# Patient Record
Sex: Female | Born: 1970 | Race: White | Hispanic: No | Smoking: Never smoker
Health system: Southern US, Community
[De-identification: ages and names within clinical notes are randomized; demographics above are authoritative.]

## PROBLEM LIST (undated history)

## (undated) DIAGNOSIS — M1990 Unspecified osteoarthritis, unspecified site: Secondary | ICD-10-CM

## (undated) DIAGNOSIS — C801 Malignant (primary) neoplasm, unspecified: Secondary | ICD-10-CM

## (undated) DIAGNOSIS — Z803 Family history of malignant neoplasm of breast: Secondary | ICD-10-CM

## (undated) DIAGNOSIS — Z923 Personal history of irradiation: Secondary | ICD-10-CM

## (undated) DIAGNOSIS — Z9221 Personal history of antineoplastic chemotherapy: Secondary | ICD-10-CM

## (undated) HISTORY — PX: ABDOMINAL HYSTERECTOMY: SHX81

## (undated) HISTORY — PX: BREAST LUMPECTOMY: SHX2

## (undated) HISTORY — PX: HYMENECTOMY: SHX987

## (undated) HISTORY — DX: Family history of malignant neoplasm of breast: Z80.3

---

## 2016-03-26 ENCOUNTER — Other Ambulatory Visit (HOSPITAL_COMMUNITY)
Admission: RE | Admit: 2016-03-26 | Discharge: 2016-03-26 | Disposition: A | Discharge status: Home / Self Care | Payer: Managed Care, Other (non HMO) | Source: Ambulatory Visit | Attending: Family Medicine | Admitting: Family Medicine

## 2016-03-26 ENCOUNTER — Other Ambulatory Visit: Payer: Self-pay | Admitting: Family Medicine

## 2016-03-26 DIAGNOSIS — Z124 Encounter for screening for malignant neoplasm of cervix: Secondary | ICD-10-CM | POA: Diagnosis present

## 2016-03-26 DIAGNOSIS — Z1151 Encounter for screening for human papillomavirus (HPV): Secondary | ICD-10-CM | POA: Diagnosis not present

## 2016-03-27 LAB — CYTOLOGY - PAP

## 2016-03-31 ENCOUNTER — Other Ambulatory Visit: Payer: Self-pay | Admitting: Family Medicine

## 2016-04-01 ENCOUNTER — Other Ambulatory Visit: Payer: Self-pay | Admitting: Family Medicine

## 2016-04-01 DIAGNOSIS — R1030 Lower abdominal pain, unspecified: Secondary | ICD-10-CM

## 2016-04-01 DIAGNOSIS — N946 Dysmenorrhea, unspecified: Secondary | ICD-10-CM

## 2016-04-06 ENCOUNTER — Ambulatory Visit
Admission: RE | Admit: 2016-04-06 | Discharge: 2016-04-06 | Disposition: A | Discharge status: Home / Self Care | Payer: Managed Care, Other (non HMO) | Source: Ambulatory Visit | Attending: Family Medicine | Admitting: Family Medicine

## 2016-04-06 DIAGNOSIS — N946 Dysmenorrhea, unspecified: Secondary | ICD-10-CM

## 2016-04-06 DIAGNOSIS — R1030 Lower abdominal pain, unspecified: Secondary | ICD-10-CM

## 2016-04-06 IMAGING — US US PELVIS COMPLETE
1 series · 13 of 25 positions shown · non-contrast
Comparison: None

CLINICAL DATA: Left lower quadrant pain for 1 month. Dysmenorrhea.
Unknown LMP.

EXAM:
TRANSABDOMINAL AND TRANSVAGINAL ULTRASOUND OF PELVIS
TECHNIQUE: Both transabdominal and transvaginal ultrasound examinations of the
pelvis were performed. Transabdominal technique was performed for
global imaging of the pelvis including uterus, ovaries, adnexal
regions, and pelvic cul-de-sac. It was necessary to proceed with
endovaginal exam following the transabdominal exam to visualize the
endometrium and ovaries.

[Series 1: us pelvis complete · 0.30mm/px · 13 of 81 slices shown]
[im 1/81]
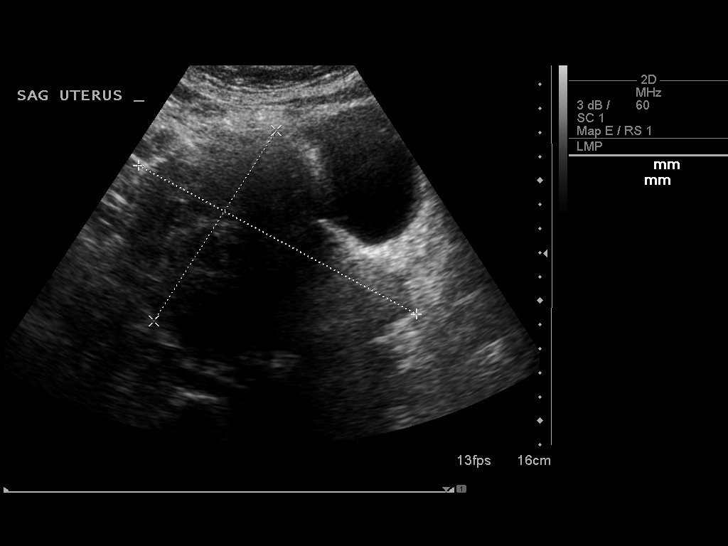
[im 7/81]
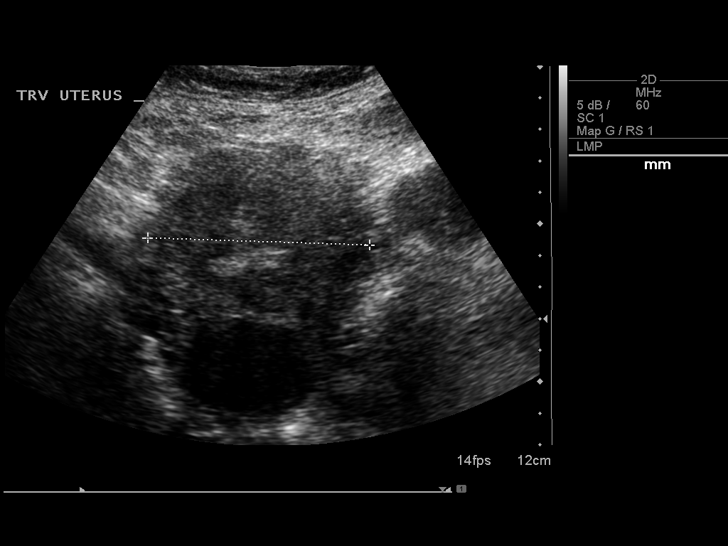
[im 14/81]
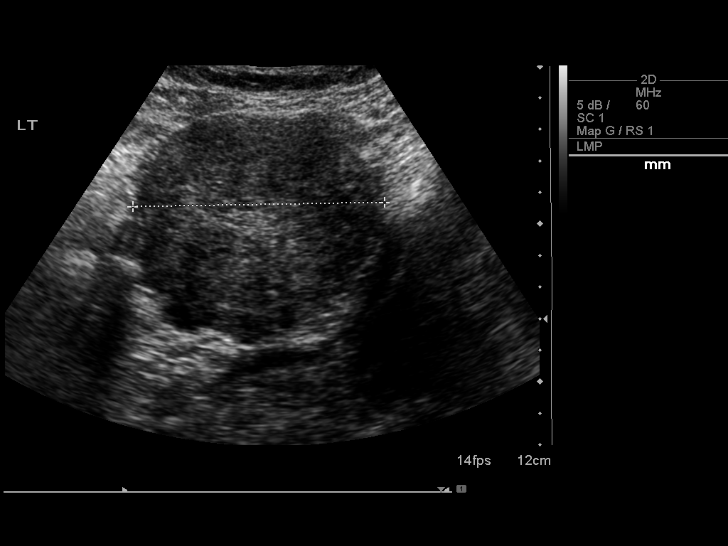
[im 21/81]
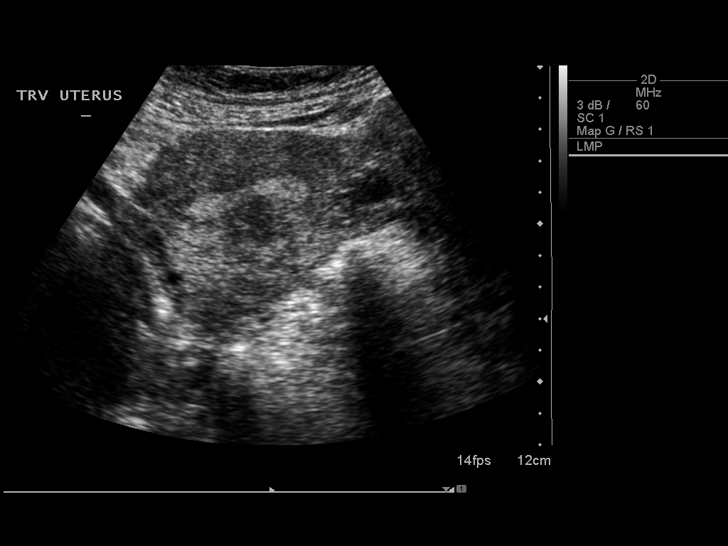
[im 27/81]
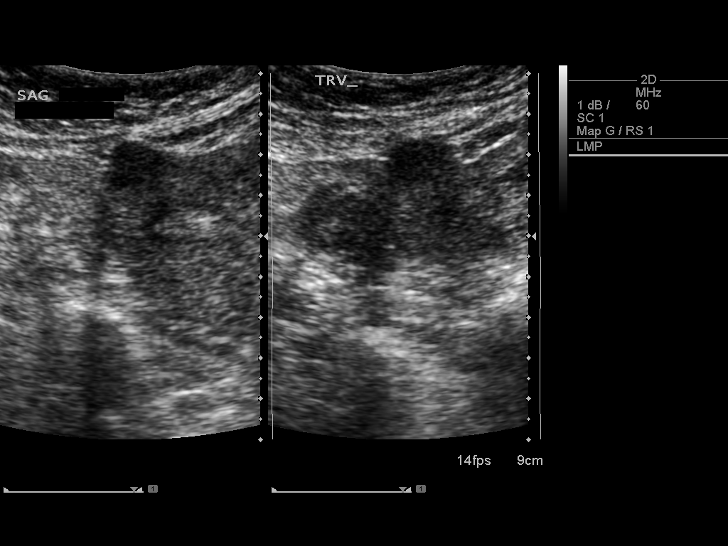
[im 34/81]
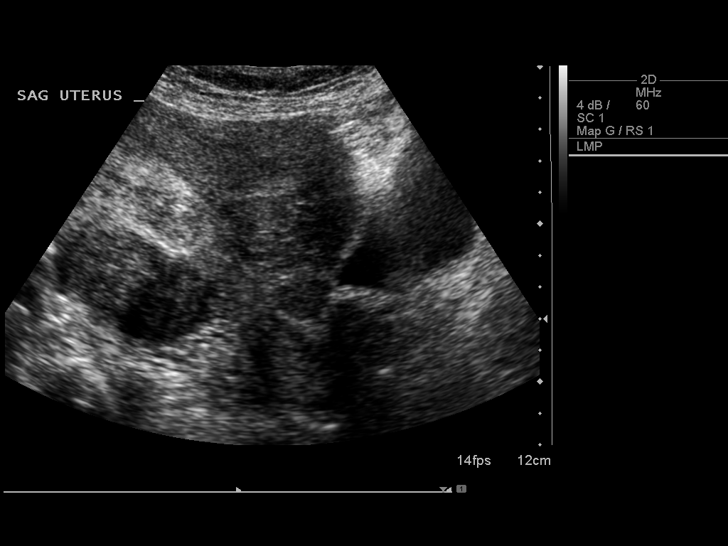
[im 41/81]
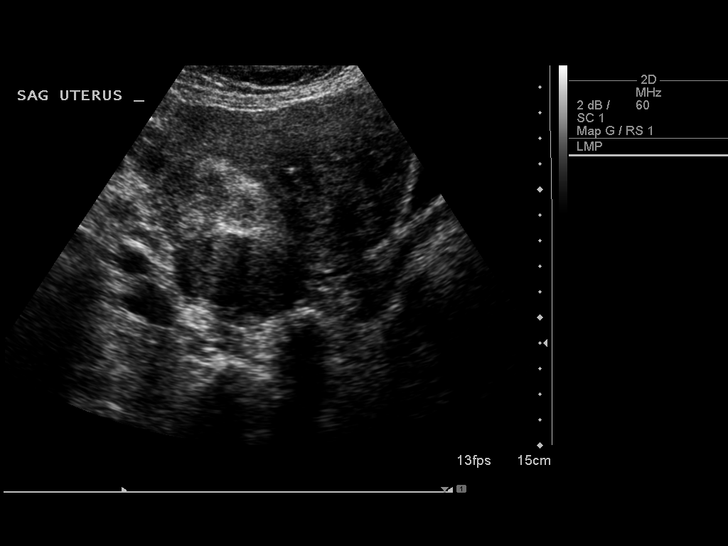
[im 47/81]
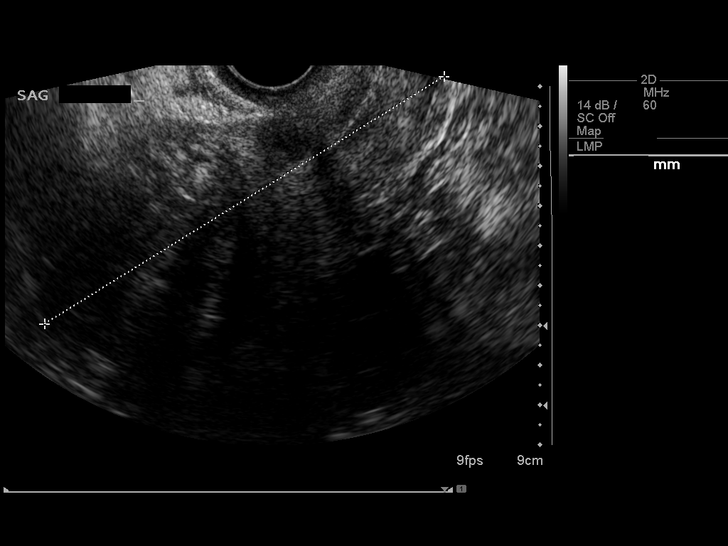
[im 54/81]
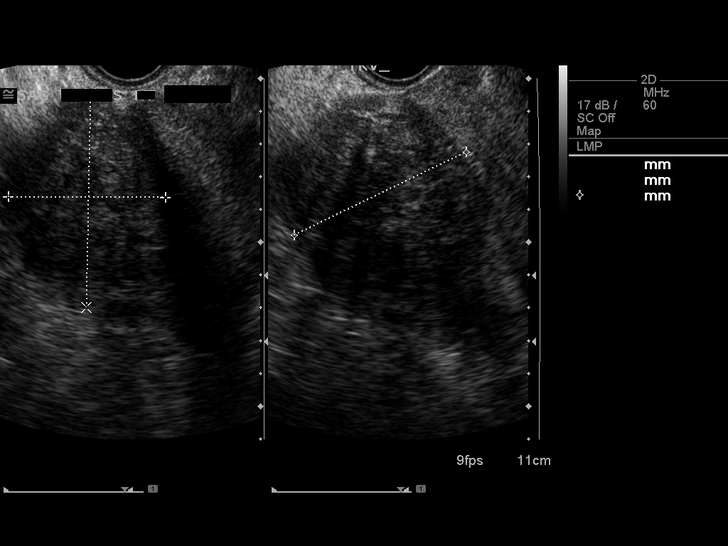
[im 61/81]
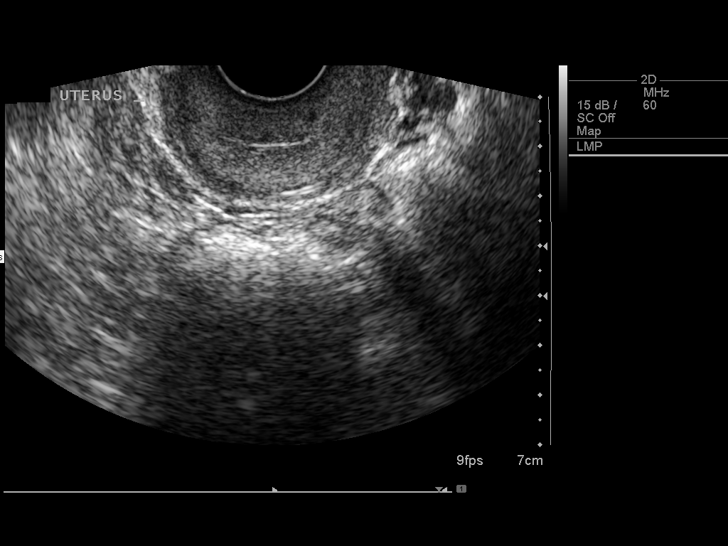
[im 67/81]
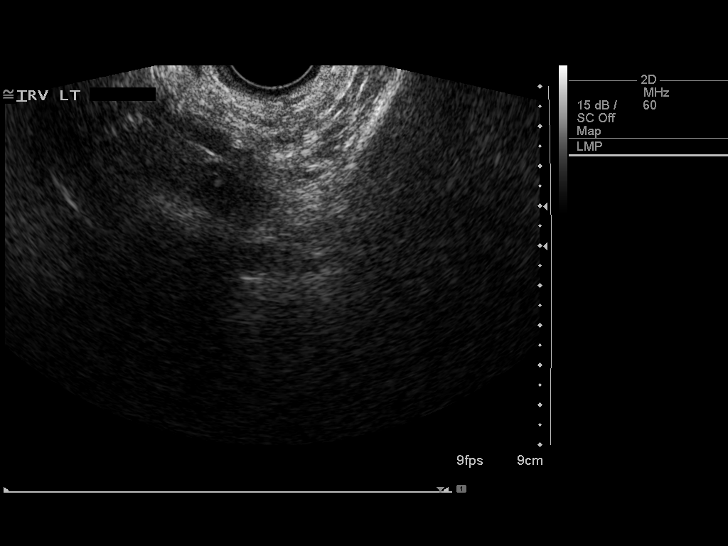
[im 74/81]
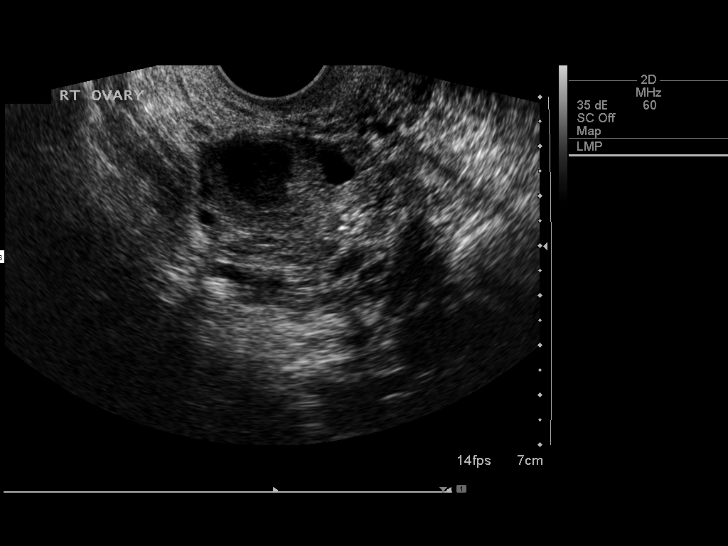
[im 81/81]
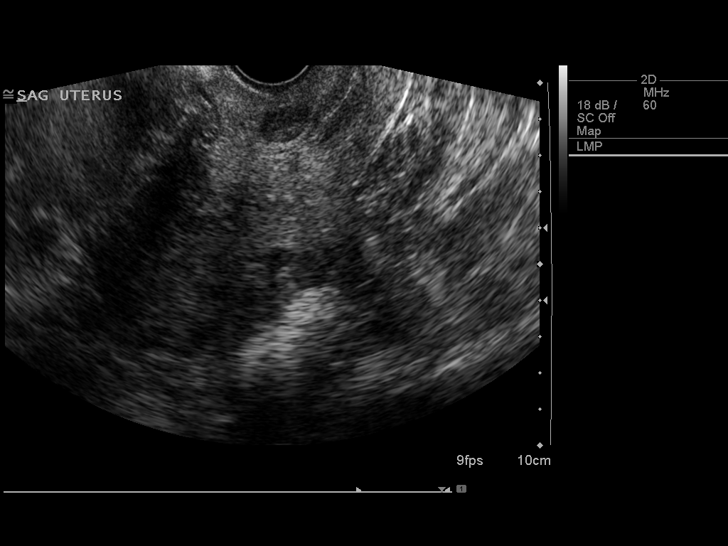

[13 of 25 positions shown; findings below may reference images not displayed]

FINDINGS: Uterus

Measurements: 13.1 x 9.5 x 13.6 cm. Multiple fibroids are seen which
involve the uterus diffusely, approximately 7 in number. Largest
fibroid is subserosal in location and arising from the left fundus
measuring 8.1 cm. Second largest fibroid is also subserosal location
in the anterior uterine body measuring 6.7 cm. At least 1 submucosal
fibroid is seen in the posterior corpus measuring 2.1 cm.

Endometrium

Thickness: 13 mm. Extrinsic compression from small submucosal
fibroid described above.

Right ovary

Measurements: 3.5 x 3.6 x 2.8 cm. 2 cm corpus luteum noted.
Otherwise normal appearance/no adnexal mass.

Left ovary

Measurements: 2.2 x 2.9 x 1.7 cm. Normal appearance/no adnexal mass.

Other findings

No abnormal free fluid.
IMPRESSION: Diffuse uterine involvement by fibroids, largest measuring 8.1 cm.
At least 1 small submucosal fibroid is seen measuring 2 cm.

2 cm right ovarian corpus luteum noted. Otherwise normal appearance
of ovaries.

## 2017-01-06 NOTE — Patient Instructions (Signed)
Your procedure is scheduled on:  Wednesday, January 20, 2017  Enter through the [employer] [facility] at:  7:00 AM  Pick up the phone at the desk and dial [phone].  Call this number if you have problems the morning of surgery: [phone].  Remember: Do NOT eat food or drink after:  Midnight Tuesday  Take these medicines the morning of surgery with a SIP OF WATER:  None  Stop ALL herbal medications at this time  Do NOT smoke the day of surgery.  Do NOT wear jewelry (body piercing), metal hair clips/bobby pins, make-up, artifical eyelashes or nail polish. Do NOT wear lotions, powders, or perfumes.  You may wear deodorant. Do NOT shave for 48 hours prior to surgery. Do NOT bring valuables to the hospital. Contacts, dentures, or bridgework may not be worn into surgery.  Leave suitcase in car.  After surgery it may be brought to your room.  For patients admitted to the hospital, checkout time is 11:00 AM the day of discharge.  Bring a copy of your healthcare power of attorney and living will documents.

## 2017-01-07 ENCOUNTER — Encounter (HOSPITAL_COMMUNITY): Payer: Self-pay

## 2017-01-07 ENCOUNTER — Other Ambulatory Visit: Payer: Self-pay | Admitting: Obstetrics and Gynecology

## 2017-01-07 ENCOUNTER — Encounter (HOSPITAL_COMMUNITY)
Admission: RE | Admit: 2017-01-07 | Discharge: 2017-01-07 | Disposition: A | Discharge status: Home / Self Care | Payer: Managed Care, Other (non HMO) | Source: Ambulatory Visit | Attending: Obstetrics and Gynecology | Admitting: Obstetrics and Gynecology

## 2017-01-07 DIAGNOSIS — Z01818 Encounter for other preprocedural examination: Secondary | ICD-10-CM | POA: Diagnosis not present

## 2017-01-07 HISTORY — DX: Unspecified osteoarthritis, unspecified site: M19.90

## 2017-01-07 LAB — CBC
HCT: 41.1 % (ref 36.0–46.0)
Hemoglobin: 13.5 g/dL (ref 12.0–15.0)
MCH: 29.2 pg (ref 26.0–34.0)
MCHC: 32.8 g/dL (ref 30.0–36.0)
MCV: 88.8 fL (ref 78.0–100.0)
Platelets: 297 10*3/uL (ref 150–400)
RBC: 4.63 MIL/uL (ref 3.87–5.11)
RDW: 12.8 % (ref 11.5–15.5)
WBC: 10.4 10*3/uL (ref 4.0–10.5)

## 2017-01-18 NOTE — Anesthesia Preprocedure Evaluation (Addendum)
Anesthesia Evaluation  Patient identified by MRN, date of birth, ID band Patient awake    Reviewed: Allergy & Precautions, H&P , Patient's Chart, lab work & pertinent test results, reviewed documented beta blocker date and time   Airway Mallampati: II  TM Distance: >3 FB Neck ROM: full    Dental no notable dental hx.    Pulmonary    Pulmonary exam normal breath sounds clear to auscultation       Cardiovascular  Rhythm:regular Rate:Normal     Neuro/Psych    GI/Hepatic   Endo/Other    Renal/GU      Musculoskeletal   Abdominal   Peds  Hematology   Anesthesia Other Findings   Reproductive/Obstetrics                             Anesthesia Physical Anesthesia Plan  ASA: II  Anesthesia Plan: General   Post-op Pain Management:    Induction: Intravenous  PONV Risk Score and Plan: 2 and Ondansetron, Dexamethasone, Propofol and Scopolamine patch - Pre-op  Airway Management Planned: Oral ETT  Additional Equipment:   Intra-op Plan:   Post-operative Plan: Extubation in OR  Informed Consent: I have reviewed the patients History and Physical, chart, labs and discussed the procedure including the risks, benefits and alternatives for the proposed anesthesia with the patient or authorized representative who has indicated his/her understanding and acceptance.   Dental Advisory Given  Plan Discussed with: CRNA and Surgeon  Anesthesia Plan Comments: (  )       Anesthesia Quick Evaluation

## 2017-01-19 ENCOUNTER — Other Ambulatory Visit: Payer: Self-pay | Admitting: Obstetrics and Gynecology

## 2017-01-19 NOTE — H&P (Deleted)
  The note originally documented on this encounter has been moved the the encounter in which it belongs.  

## 2017-01-19 NOTE — H&P (Signed)
Chief Complaint(s):   Heavy menses / PreOp history and physical for 01/20/17   HPI:  General 46 y/o Monica [name] who presents for preop history and physical exam in preparation for robotic assisted laparoscopic hysterectomy scheduled for 01/20/2017. she is a patient of Dr. Anderson Malta Ozan's. She has been onthe Lysteda for the past 6 mos and she has noted significant improvement; however, she ultimately desires surgical management. In review, she reported that menses last for a full 7-8 days with 3 very heavy days. On heavy days she has to change 2 ultra and 2 pads twice an hour then after those 3 days she can go every 2-3hrs. She also notes dysmenorrhea as well as feeling discomfort on her left side.  Since starting the Lysteda, she now only needs one tampon at a time and will last for several hours. She is no longer rushing to the bathroom or concerned about overflow. However, this medication is expensive and she desires definitive management via hystereocotmy.  US performed 9/25/Monica: Uterus measures 13x9.5x3.6cm, multiple fibroids (7)- largest subserosal 8.1cm then 6.7cm then ~ 2cm in size. Normal ovaries bilaterally.  Current Medication:  Taking  iron 1 tab Oral     Lysteda 650 MG Tablet 2 tablets Orally Three times daily during heavy menses only     Fexofenadine HCl 60 MG Tablet 1 tablet as needed Orally Twice a day     Vitamin B Complex - Tablet Orally     Medication List reviewed and reconciled with the patient   Medical History:   MRI 2013 - DDD, L3-5 disc bulges     left knee arthritis - has done PT in the past      Allergies/Intolerance:   N.K.D.A.   Gyn History:   Sexual activity not currently sexually active. Periods : every month. Denies Birth control. Last pap smear date 03/26/2016 Negative. Last mammogram date 09/21/12. Denies Abnormal pap smear. Denies STD. Menarche 58.   OB History:   Number of pregnancies 2. Pregnancy # 1 live birth, vaginal delivery. Pregnancy # 2 live birth,  vaginal delivery.   Surgical History:   hymenectomy as a teenager   Hospitalization:   Denies Past Hospitalization   Family History:   Father: alive, obesity, sleep apnea, heartburn    Mother: alive, elevated cholesterol    Brother 1: alive, obesity    5 sister(s) .    denies any GYN family cancer hx.  Social History:  General Tobacco use cigarettes: Never smoked, Tobacco history last updated 01/11/2017.  no EXPOSURE TO PASSIVE SMOKE.  Alcohol: 1-2x/week.  Caffeine: rare.  no Recreational drug use.  no DIET.  Exercise: nothing structured.  Marital Status: married.  Children: 2.  OCCUPATION: employed, Probation officer.  ROS: CONSTITUTIONAL none" options="no,yes" propid="91" itemid="[id number]" categoryid="10464" encounterid="[id number]"Fatigue none. none today" options="no,yes" propid="91" itemid="10467" categoryid="10464" encounterid="[id number]"Fever none today.  CARDIOLOGY none" options="no,yes" propid="91" itemid="[id number]" categoryid="10488" encounterid="[id number]"Chest pain none.  RESPIRATORY no" options="no" propid="91" itemid="270013" categoryid="138132" encounterid="[id number]"Shortness of breath no. no" options="no,yes" propid="91" itemid="172745" categoryid="138132" encounterid="[id number]"Cough no.  GASTROENTEROLOGY none" options="no,yes" propid="91" itemid="193447" categoryid="10494" encounterid="[id number]"Appetite change none. no" options="no,yes" propid="91" itemid="193449" categoryid="10494" encounterid="[id number]"Change in bowel habits no.  Monica [name] REPRODUCTIVE no" options="no,yes" propid="91" itemid="196298" categoryid="10525" encounterid="[id number]"Breast lumps or discharge no. none" options="no,yes" propid="91" itemid="186083" categoryid="10525" encounterid="[id number]"Breast pain none. none" options="no,yes" propid="91" itemid="138198" categoryid="10525" encounterid="[id number]"Dyspareunia none. no" options="no,yes" propid="91" itemid="202654" categoryid="10525"  encounterid="[id number]"Dysuria no. none" options="no,yes" propid="91" itemid="186082" categoryid="10525" encounterid="[id number]"Pelvic pain none. yes" options="no,yes" propid="91" itemid="199173" categoryid="10525" encounterid="[id number]"Regular menses yes. no" options="no,yes" propid="91" itemid="278230" categoryid="10525" encounterid="[id number]"Unusual vaginal discharge no. no" options="no,yes" propid="91" itemid="278942" categoryid="10525" encounterid="[id number]"Vaginal  itching no. no" options="no,yes" propid="91" itemid="278837" categoryid="10525" encounterid="[id number]"Vulvar/labial lesion no.  NEUROLOGY none" options="no,yes" propid="91" itemid="193627" categoryid="12512" encounterid="[id number]"Migraines none. none" options="no,yes" propid="91" itemid="12514" categoryid="12512" encounterid="[id number]"Tingling/numbness none. none" options="no,yes" propid="91" itemid="193467" categoryid="12512" encounterid="[id number]"Visual changes none.  PSYCHOLOGY no" options="" propid="91" itemid="275919" categoryid="10520" encounterid="[id number]"Depression no.  SKIN no" options="no,yes" propid="91" itemid="269383" categoryid="202750" encounterid="[id number]"Rash no. no" options="no,yes" propid="91" itemid="202757" categoryid="202750" encounterid="[id number]"Suspicious lesions no.  ENDOCRINOLOGY none" options="no,yes" propid="91" itemid="202624" categoryid="12508" encounterid="[id number]"Hot flashes none. no unintentional" options="no,yes" propid="91" itemid="193436" categoryid="12508" encounterid="[id number]"Weight gain no unintentional. none" options="no,yes" propid="91" itemid="138164" categoryid="12508" encounterid="[id number]"Weight loss none.  HEMATOLOGY/LYMPH no" options="no,yes" propid="91" itemid="193454" categoryid="138157" encounterid="[id number]"Anemia no.    Objective: Vitals:  Wt 195.2, Wt change -1.6 lb, Ht 66, BMI 31.50, Pulse sitting 66, BP sitting 114/74  Past Results:  Examination:  Physical Examination: GENERAL in NAD,  pleasant"Patient appears in NAD, pleasant. well developed"Build: well developed. overweight"General Appearance: overweight. caucasian"Race: caucasian.  LUNGS clear to auscultation"Breath sounds: clear to auscultation. no"Dyspnea: no.  HEART none"Murmurs: none. normal"Rate: normal. regular"Rhythm: regular.  ABDOMEN no masses,tenderness,organomegaly, no CVAT"General: no masses,tenderness,organomegaly, no CVAT.  Monica [name] GENITOURINARY Cervix normal, Uterus 14 wk size freely mobil No adnexal masses"Pelvic Cervix normal, Uterus 14 wk size freely mobil No adnexal masses. uterine size 12-14 weeks"Bimanual: uterine size 12-14 weeks. no lesions"External genitalia: no lesions. pink/moist mucosa, no abnormal discharge"Vagina: pink/moist mucosa, no abnormal discharge. normal, normal"Vulva: normal, normal.  EXTREMITIES FROM of all extremities"Extremities FROM of all extremities.  NEUROLOGICAL normal"Gait: normal. alert and oriented x 3"Orientation: alert and oriented x 3.    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 I discussed with her the risk of surgery including but not limited to infection/ bleeding/ damage to bowel bladder ureters with the need for futher surgery. R/O blood transfusion/ risk of converstion to an open abdominal hysterctomy. she accepts this risk and desires to proceed with definitive therapy. she will continue the lysteda for now , i.  Subserosal leiomyoma of uterus  Notes: she is a candidate for robotic assisted laparoscopic hysterectomy with bilateral salingectomy. I discussed with her the risk of surgery including but not limited to infection/ bleeding/ damage to bowel bladder ureters with the need for futher surgery. R/O blood  transfusion/ risk of converstion to an open abdominal hysterctomy. she accepts this risk and desires to proceed with definitive therapy.

## 2017-01-20 ENCOUNTER — Ambulatory Visit (HOSPITAL_COMMUNITY): Payer: Managed Care, Other (non HMO) | Admitting: Anesthesiology

## 2017-01-20 ENCOUNTER — Observation Stay (HOSPITAL_COMMUNITY)
Admission: RE | Admit: 2017-01-20 | Discharge: 2017-01-21 | Disposition: A | Discharge status: Home / Self Care | Payer: Managed Care, Other (non HMO) | Source: Ambulatory Visit | Attending: Obstetrics and Gynecology | Admitting: Obstetrics and Gynecology

## 2017-01-20 ENCOUNTER — Encounter (HOSPITAL_COMMUNITY): Payer: Self-pay | Admitting: Anesthesiology

## 2017-01-20 ENCOUNTER — Encounter (HOSPITAL_COMMUNITY)
Admission: RE | Disposition: A | Discharge status: Home / Self Care | Payer: Self-pay | Source: Ambulatory Visit | Attending: Obstetrics and Gynecology

## 2017-01-20 DIAGNOSIS — N920 Excessive and frequent menstruation with regular cycle: Principal | ICD-10-CM | POA: Insufficient documentation

## 2017-01-20 DIAGNOSIS — Z01818 Encounter for other preprocedural examination: Secondary | ICD-10-CM | POA: Insufficient documentation

## 2017-01-20 DIAGNOSIS — D252 Subserosal leiomyoma of uterus: Secondary | ICD-10-CM | POA: Insufficient documentation

## 2017-01-20 DIAGNOSIS — M1712 Unilateral primary osteoarthritis, left knee: Secondary | ICD-10-CM | POA: Insufficient documentation

## 2017-01-20 DIAGNOSIS — N946 Dysmenorrhea, unspecified: Secondary | ICD-10-CM | POA: Diagnosis not present

## 2017-01-20 DIAGNOSIS — Z90710 Acquired absence of both cervix and uterus: Secondary | ICD-10-CM | POA: Diagnosis present

## 2017-01-20 HISTORY — PX: ROBOTIC ASSISTED TOTAL HYSTERECTOMY WITH SALPINGECTOMY: SHX6679

## 2017-01-20 LAB — PREGNANCY, URINE: Preg Test, Ur: NEGATIVE

## 2017-01-20 LAB — ABO/RH: ABO/RH(D): O NEG

## 2017-01-20 LAB — TYPE AND SCREEN
ABO/RH(D): O NEG
Antibody Screen: NEGATIVE

## 2017-01-20 SURGERY — ROBOTIC ASSISTED TOTAL HYSTERECTOMY WITH SALPINGECTOMY
Anesthesia: General | Site: Abdomen | Laterality: Left

## 2017-01-20 MED ORDER — PROPOFOL 10 MG/ML IV BOLUS
INTRAVENOUS | Status: AC
  Filled 2017-01-20: qty 20

## 2017-01-20 MED ORDER — ONDANSETRON HCL 4 MG/2ML IJ SOLN
INTRAMUSCULAR | Status: DC | PRN
  Administered 2017-01-20: 4 mg via INTRAVENOUS

## 2017-01-20 MED ORDER — CEFAZOLIN SODIUM-DEXTROSE 2-4 GM/100ML-% IV SOLN
2.0000 g | INTRAVENOUS | Status: AC
  Administered 2017-01-20 (×2): 2 g via INTRAVENOUS

## 2017-01-20 MED ORDER — EPHEDRINE SULFATE 50 MG/ML IJ SOLN
INTRAMUSCULAR | Status: DC | PRN
  Administered 2017-01-20 (×2): 10 mg via INTRAVENOUS

## 2017-01-20 MED ORDER — METHYLENE BLUE 0.5 % INJ SOLN
INTRAVENOUS | Status: AC
  Filled 2017-01-20: qty 10

## 2017-01-20 MED ORDER — KETOROLAC TROMETHAMINE 30 MG/ML IJ SOLN
30.0000 mg | Freq: Four times a day (QID) | INTRAMUSCULAR | Status: DC
  Administered 2017-01-20 – 2017-01-21 (×3): 30 mg via INTRAVENOUS
  Filled 2017-01-20 (×3): qty 1

## 2017-01-20 MED ORDER — LACTATED RINGERS IV SOLN
INTRAVENOUS | Status: DC
  Administered 2017-01-20: 1000 mL via INTRAVENOUS
  Administered 2017-01-20: 09:00:00 via INTRAVENOUS

## 2017-01-20 MED ORDER — DEXAMETHASONE SODIUM PHOSPHATE 10 MG/ML IJ SOLN
INTRAMUSCULAR | Status: DC | PRN
  Administered 2017-01-20: 4 mg via INTRAVENOUS

## 2017-01-20 MED ORDER — SODIUM CHLORIDE 0.9 % IJ SOLN
INTRAMUSCULAR | Status: AC
  Filled 2017-01-20: qty 10

## 2017-01-20 MED ORDER — OXYCODONE-ACETAMINOPHEN 5-325 MG PO TABS
1.0000 | ORAL_TABLET | ORAL | Status: DC | PRN
  Administered 2017-01-20 – 2017-01-21 (×2): 2 via ORAL
  Filled 2017-01-20 (×2): qty 2

## 2017-01-20 MED ORDER — ARTIFICIAL TEARS OPHTHALMIC OINT
TOPICAL_OINTMENT | OPHTHALMIC | Status: AC
  Filled 2017-01-20: qty 3.5

## 2017-01-20 MED ORDER — LIDOCAINE HCL (CARDIAC) 20 MG/ML IV SOLN
INTRAVENOUS | Status: DC | PRN
  Administered 2017-01-20: 70 mg via INTRAVENOUS
  Administered 2017-01-20: 30 mg via INTRAVENOUS

## 2017-01-20 MED ORDER — ONDANSETRON HCL 4 MG/2ML IJ SOLN
INTRAMUSCULAR | Status: AC
  Filled 2017-01-20: qty 2

## 2017-01-20 MED ORDER — MIDAZOLAM HCL 2 MG/2ML IJ SOLN
INTRAMUSCULAR | Status: DC | PRN
  Administered 2017-01-20: 2 mg via INTRAVENOUS

## 2017-01-20 MED ORDER — KETOROLAC TROMETHAMINE 30 MG/ML IJ SOLN: 30.0000 mg | Freq: Four times a day (QID) | INTRAMUSCULAR | Status: DC

## 2017-01-20 MED ORDER — ONDANSETRON HCL 4 MG/2ML IJ SOLN: 4.0000 mg | Freq: Four times a day (QID) | INTRAMUSCULAR | Status: DC | PRN

## 2017-01-20 MED ORDER — SUGAMMADEX SODIUM 200 MG/2ML IV SOLN
INTRAVENOUS | Status: AC
  Filled 2017-01-20: qty 2

## 2017-01-20 MED ORDER — SCOPOLAMINE 1 MG/3DAYS TD PT72
MEDICATED_PATCH | TRANSDERMAL | Status: AC
  Filled 2017-01-20: qty 1

## 2017-01-20 MED ORDER — EPHEDRINE 5 MG/ML INJ
INTRAVENOUS | Status: AC
  Filled 2017-01-20: qty 10

## 2017-01-20 MED ORDER — ACETAMINOPHEN 160 MG/5ML PO SOLN
ORAL | Status: AC
  Filled 2017-01-20: qty 20.3

## 2017-01-20 MED ORDER — ROPIVACAINE HCL 5 MG/ML IJ SOLN
INTRAMUSCULAR | Status: AC
Start: 2017-01-20 — End: 2017-01-20
  Filled 2017-01-20: qty 60

## 2017-01-20 MED ORDER — SODIUM CHLORIDE 0.9 % IV SOLN
INTRAVENOUS | Status: DC | PRN
  Administered 2017-01-20: 117 mL

## 2017-01-20 MED ORDER — LACTATED RINGERS IR SOLN
Status: DC | PRN
  Administered 2017-01-20: 3000 mL

## 2017-01-20 MED ORDER — HYDROMORPHONE HCL 1 MG/ML IJ SOLN
0.2000 mg | INTRAMUSCULAR | Status: DC | PRN
  Administered 2017-01-20: 0.6 mg via INTRAVENOUS
  Filled 2017-01-20: qty 1

## 2017-01-20 MED ORDER — HYDROMORPHONE HCL 1 MG/ML IJ SOLN
INTRAMUSCULAR | Status: AC
  Administered 2017-01-20: 0.25 mg via INTRAVENOUS
  Filled 2017-01-20: qty 0.5

## 2017-01-20 MED ORDER — LIDOCAINE HCL (CARDIAC) 20 MG/ML IV SOLN
INTRAVENOUS | Status: AC
  Filled 2017-01-20: qty 5

## 2017-01-20 MED ORDER — ROCURONIUM BROMIDE 100 MG/10ML IV SOLN
INTRAVENOUS | Status: AC
  Filled 2017-01-20: qty 1

## 2017-01-20 MED ORDER — KETOROLAC TROMETHAMINE 30 MG/ML IJ SOLN
INTRAMUSCULAR | Status: DC | PRN
  Administered 2017-01-20: 30 mg via INTRAVENOUS

## 2017-01-20 MED ORDER — ALUM & MAG HYDROXIDE-SIMETH 200-200-20 MG/5ML PO SUSP: 30.0000 mL | ORAL | Status: DC | PRN

## 2017-01-20 MED ORDER — ROCURONIUM BROMIDE 100 MG/10ML IV SOLN
INTRAVENOUS | Status: DC | PRN
  Administered 2017-01-20: 20 mg via INTRAVENOUS
  Administered 2017-01-20: 50 mg via INTRAVENOUS
  Administered 2017-01-20: 10 mg via INTRAVENOUS

## 2017-01-20 MED ORDER — LACTATED RINGERS IV SOLN
INTRAVENOUS | Status: DC
  Administered 2017-01-20 (×2): via INTRAVENOUS

## 2017-01-20 MED ORDER — SIMETHICONE 80 MG PO CHEW
80.0000 mg | CHEWABLE_TABLET | Freq: Four times a day (QID) | ORAL | Status: DC | PRN
  Administered 2017-01-20: 80 mg via ORAL
  Filled 2017-01-20: qty 1

## 2017-01-20 MED ORDER — SODIUM CHLORIDE 0.9 % IJ SOLN
INTRAMUSCULAR | Status: AC
  Filled 2017-01-20: qty 50

## 2017-01-20 MED ORDER — ARTIFICIAL TEARS OPHTHALMIC OINT
TOPICAL_OINTMENT | OPHTHALMIC | Status: DC | PRN
  Administered 2017-01-20: 1 via OPHTHALMIC

## 2017-01-20 MED ORDER — MIDAZOLAM HCL 2 MG/2ML IJ SOLN
INTRAMUSCULAR | Status: AC
  Filled 2017-01-20: qty 2

## 2017-01-20 MED ORDER — FENTANYL CITRATE (PF) 250 MCG/5ML IJ SOLN
INTRAMUSCULAR | Status: AC
  Filled 2017-01-20: qty 5

## 2017-01-20 MED ORDER — HYDROMORPHONE HCL 1 MG/ML IJ SOLN
0.2500 mg | INTRAMUSCULAR | Status: DC | PRN
  Administered 2017-01-20 (×2): 0.25 mg via INTRAVENOUS

## 2017-01-20 MED ORDER — IBUPROFEN 800 MG PO TABS: 800.0000 mg | ORAL_TABLET | Freq: Three times a day (TID) | ORAL | Status: DC | PRN

## 2017-01-20 MED ORDER — DEXAMETHASONE SODIUM PHOSPHATE 4 MG/ML IJ SOLN
INTRAMUSCULAR | Status: AC
  Filled 2017-01-20: qty 1

## 2017-01-20 MED ORDER — FENTANYL CITRATE (PF) 100 MCG/2ML IJ SOLN
INTRAMUSCULAR | Status: DC | PRN
  Administered 2017-01-20: 25 ug via INTRAVENOUS
  Administered 2017-01-20 (×3): 50 ug via INTRAVENOUS
  Administered 2017-01-20: 25 ug via INTRAVENOUS

## 2017-01-20 MED ORDER — KETOROLAC TROMETHAMINE 30 MG/ML IJ SOLN
INTRAMUSCULAR | Status: AC
  Filled 2017-01-20: qty 1

## 2017-01-20 MED ORDER — PROPOFOL 10 MG/ML IV BOLUS
INTRAVENOUS | Status: DC | PRN
  Administered 2017-01-20: 170 mg via INTRAVENOUS

## 2017-01-20 MED ORDER — ONDANSETRON HCL 4 MG PO TABS: 4.0000 mg | ORAL_TABLET | Freq: Four times a day (QID) | ORAL | Status: DC | PRN

## 2017-01-20 MED ORDER — MENTHOL 3 MG MT LOZG: 1.0000 | LOZENGE | OROMUCOSAL | Status: DC | PRN

## 2017-01-20 MED ORDER — SUGAMMADEX SODIUM 200 MG/2ML IV SOLN
INTRAVENOUS | Status: DC | PRN
  Administered 2017-01-20: 177 mg via INTRAVENOUS

## 2017-01-20 MED ORDER — ACETAMINOPHEN 160 MG/5ML PO SOLN
975.0000 mg | Freq: Once | ORAL | Status: AC
  Administered 2017-01-20: 975 mg via ORAL

## 2017-01-20 MED ORDER — SCOPOLAMINE 1 MG/3DAYS TD PT72
1.0000 | MEDICATED_PATCH | Freq: Once | TRANSDERMAL | Status: DC
  Administered 2017-01-20: 1.5 mg via TRANSDERMAL

## 2017-01-20 MED ORDER — PHENYLEPHRINE 40 MCG/ML (10ML) SYRINGE FOR IV PUSH (FOR BLOOD PRESSURE SUPPORT)
PREFILLED_SYRINGE | INTRAVENOUS | Status: AC
  Filled 2017-01-20: qty 10

## 2017-01-20 SURGICAL SUPPLY — 54 items
APPLICATOR ARISTA FLEXITIP XL (MISCELLANEOUS) IMPLANT
BARRIER ADHS 3X4 INTERCEED (GAUZE/BANDAGES/DRESSINGS) ×2 IMPLANT
CATH FOLEY 3WAY  5CC 16FR (CATHETERS) ×1
CATH FOLEY 3WAY 5CC 16FR (CATHETERS) ×1 IMPLANT
CONT PATH 16OZ SNAP LID 3702 (MISCELLANEOUS) ×2 IMPLANT
COVER BACK TABLE 60X90IN (DRAPES) ×4 IMPLANT
COVER TIP SHEARS 8 DVNC (MISCELLANEOUS) ×1 IMPLANT
COVER TIP SHEARS 8MM DA VINCI (MISCELLANEOUS) ×1
DECANTER SPIKE VIAL GLASS SM (MISCELLANEOUS) ×8 IMPLANT
DEFOGGER SCOPE WARMER CLEARIFY (MISCELLANEOUS) ×2 IMPLANT
DERMABOND ADVANCED (GAUZE/BANDAGES/DRESSINGS) ×1
DERMABOND ADVANCED .7 DNX12 (GAUZE/BANDAGES/DRESSINGS) ×1 IMPLANT
DILATOR CANAL MILEX (MISCELLANEOUS) IMPLANT
DURAPREP 26ML APPLICATOR (WOUND CARE) ×2 IMPLANT
ELECT REM PT RETURN 9FT ADLT (ELECTROSURGICAL) ×2
ELECTRODE REM PT RTRN 9FT ADLT (ELECTROSURGICAL) ×1 IMPLANT
GAUZE VASELINE 3X9 (GAUZE/BANDAGES/DRESSINGS) IMPLANT
GLOVE BIOGEL M 6.5 STRL (GLOVE) ×6 IMPLANT
GLOVE BIOGEL PI IND STRL 7.0 (GLOVE) ×5 IMPLANT
GLOVE BIOGEL PI INDICATOR 7.0 (GLOVE) ×5
HEMOSTAT ARISTA ABSORB 3G PWDR (MISCELLANEOUS) IMPLANT
KIT ACCESSORY DA VINCI DISP (KITS) ×1
KIT ACCESSORY DVNC DISP (KITS) ×1 IMPLANT
LEGGING LITHOTOMY PAIR STRL (DRAPES) ×2 IMPLANT
OCCLUDER COLPOPNEUMO (BALLOONS) ×2 IMPLANT
PACK ROBOT WH (CUSTOM PROCEDURE TRAY) ×2 IMPLANT
PACK ROBOTIC GOWN (GOWN DISPOSABLE) ×2 IMPLANT
PACK TRENDGUARD 450 HYBRID PRO (MISCELLANEOUS) ×1 IMPLANT
PACK TRENDGUARD 600 HYBRD PROC (MISCELLANEOUS) IMPLANT
PAD PREP 24X48 CUFFED NSTRL (MISCELLANEOUS) ×2 IMPLANT
POUCH LAPAROSCOPIC INSTRUMENT (MISCELLANEOUS) ×2 IMPLANT
PROTECTOR NERVE ULNAR (MISCELLANEOUS) ×4 IMPLANT
SET CYSTO W/LG BORE CLAMP LF (SET/KITS/TRAYS/PACK) IMPLANT
SET IRRIG TUBING LAPAROSCOPIC (IRRIGATION / IRRIGATOR) ×2 IMPLANT
SET TRI-LUMEN FLTR TB AIRSEAL (TUBING) ×2 IMPLANT
SUT VIC AB 0 CT1 27 (SUTURE) ×2
SUT VIC AB 0 CT1 27XBRD ANBCTR (SUTURE) ×2 IMPLANT
SUT VICRYL 0 UR6 27IN ABS (SUTURE) ×2 IMPLANT
SUT VICRYL RAPIDE 4/0 PS 2 (SUTURE) ×4 IMPLANT
SUT VLOC 180 0 9IN  GS21 (SUTURE) ×2
SUT VLOC 180 0 9IN GS21 (SUTURE) ×2 IMPLANT
SYR 50ML LL SCALE MARK (SYRINGE) ×2 IMPLANT
TIP RUMI ORANGE 6.7MMX12CM (TIP) IMPLANT
TIP UTERINE 5.1X6CM LAV DISP (MISCELLANEOUS) IMPLANT
TIP UTERINE 6.7X10CM GRN DISP (MISCELLANEOUS) ×2 IMPLANT
TIP UTERINE 6.7X6CM WHT DISP (MISCELLANEOUS) IMPLANT
TIP UTERINE 6.7X8CM BLUE DISP (MISCELLANEOUS) IMPLANT
TOWEL OR 17X24 6PK STRL BLUE (TOWEL DISPOSABLE) ×6 IMPLANT
TRENDGUARD 450 HYBRID PRO PACK (MISCELLANEOUS) ×2
TRENDGUARD 600 HYBRID PROC PK (MISCELLANEOUS)
TROCAR 12M 150ML BLUNT (TROCAR) ×2 IMPLANT
TROCAR DISP BLADELESS 8 DVNC (TROCAR) ×1 IMPLANT
TROCAR DISP BLADELESS 8MM (TROCAR) ×1
TROCAR PORT AIRSEAL 8X120 (TROCAR) ×2 IMPLANT

## 2017-01-20 NOTE — Transfer of Care (Incomplete)
Immediate Anesthesia Transfer of Care Note  Patient: Monica [name]  Procedure(s) Performed: Procedure(s): ROBOTIC ASSISTED TOTAL HYSTERECTOMY WITH SALPINGECTOMY (Left)  Patient Location: {PLACES; ANE POST:19477::"PACU"}  Anesthesia Type:{PROCEDURES; ANE POST ANESTHESIA TYPE:19480}  Level of Consciousness: {FINDINGS; ANE POST LEVEL OF CONSCIOUSNESS:19484}  Airway & Oxygen Therapy: {Exam; oxygen device:30095}  Post-op Assessment: {ASSESSMENT;POST-OP VPCHEK:35248}  Post vital signs: {DESC; ANE POST LYHTMB:31121}  Last Vitals:  Vitals:   01/20/17 0730  BP: 121/83  Pulse: 71  Resp: 16  Temp: 36.7 C    Last Pain:  Vitals:   01/20/17 0730  TempSrc: Oral      Patients Stated Pain Goal: 4 (62/44/69 5072)  Complications: {FINDINGS; ANE POST COMPLICATIONS:19485}

## 2017-01-20 NOTE — Anesthesia Procedure Notes (Signed)
Procedure Name: Intubation Date/Time: 01/20/2017 8:38 AM Performed by: Tobin Chad Pre-anesthesia Checklist: Patient identified, Emergency Drugs available, Suction available and Patient being monitored Patient Re-evaluated:Patient Re-evaluated prior to inductionOxygen Delivery Method: Circle system utilized and Simple face mask Preoxygenation: Pre-oxygenation with 100% oxygen Intubation Type: IV induction Ventilation: Mask ventilation without difficulty Laryngoscope Size: Mac and 3 Grade View: Grade II Tube type: Oral Tube size: 7.0 mm Number of attempts: 1 Airway Equipment and Method: Stylet Placement Confirmation: ETT inserted through vocal cords under direct vision,  positive ETCO2 and breath sounds checked- equal and bilateral Secured at: 21 cm Tube secured with: Tape Dental Injury: Teeth and Oropharynx as per pre-operative assessment

## 2017-01-20 NOTE — H&P (Signed)
Date of Initial H&P:01/19/2017  History reviewed, patient examined, no change in status, stable for surgery.

## 2017-01-20 NOTE — Op Note (Addendum)
01/20/2017  6:35 PM  PATIENT:  Monica [name]  46 y.o. female  PRE-OPERATIVE DIAGNOSIS:  D25.2 Fibroids N92.0 Menorrhagia R10.2 Pelvic Pain  POST-OPERATIVE DIAGNOSIS:  D25.2 Fibroids N92.0 Menorrhagia  PROCEDURE:  Procedure(s): ROBOTIC ASSISTED TOTAL HYSTERECTOMY WITH SALPINGECTOMY (Left)  SURGEON:  Surgeon(s) and Role:    [name], MD - Primary    * Janyth Pupa, DO - Assisting  PHYSICIAN ASSISTANT: None  ASSISTANTS: none   ANESTHESIA:   general  EBL:  Total I/O In: 5027 [P.O.:300; I.V.:2450] Out: 675 [Urine:525; Blood:150]  BLOOD ADMINISTERED:none  DRAINS: Urinary Catheter (Foley)   LOCAL MEDICATIONS USED:  OTHER Ropivicaine.   SPECIMEN:  Source of Specimen:  Uterus, cervix Left fallopian tube.   DISPOSITION OF SPECIMEN:  PATHOLOGY  COUNTS:  YES  TOURNIQUET:  * No tourniquets in log *  DICTATION: .Dragon Dictation  PLAN OF CARE: Admit for overnight observation  PATIENT DISPOSITION:  PACU - hemodynamically stable.   Delay start of Pharmacological VTE agent (>24hrs) due to surgical blood loss or risk of bleeding: not applicable  Findings: Multiple Fibroid Uterus with large Fibroid in the Left Broad ligament. Normal fallopian tubes and ovaries however the Right fallopian tube was adherent to the Right ovary.   Procedure: The patient was taken to the operating room where she was placed under general anesthesia.Time out was performed. Marland Kitchen She was placed in dorsal lithotomy position and prepped and draped in the usual sterile fashion. A weighted speculum was placed into the vagina. A Deaver was placed anteriorly for retraction. The anterior lip of the cervix was grasped with a single-tooth tenaculum. The vaginal mucosa was injected with 2.5 cc of ropivacaine at the 2/4/ 8 and 10 o'clock positions. The uterus was sounded to 12 cm the cervix was dilated to 6 mm . 0 vicryl suture placed at the 12 and 6:00 positions Of the cervix to facilitate placement of a Ru mi  uterine manipulator. The manipulator was placed without difficulty. Weighted speculum and Deaver were removed .    Attention was turned to the patient's abdomen where a 12 mm trocar was placed 4 cm above the umbilicus. under direct visualization . The pneumoperitoneum was achieved with PCO2 gas. The laparoscope was removed. 60 cc of ropivacaine were injected into the abdominal cavity. The laparoscope was reinserted. An 8 mm trocar was placed in the right upper quadrant 16 centimeters from the umbilicus.later connected to robotic arm #3). An 8MM incision was made in the right  upper quadrant trocar was placed  8 cm from the umbilicus. Later connected to robotic arm #1. An 8 mm incision was made in the left upper quadrant 16 cm from the umbilicus and connected to robot arm #2. Marland Kitchen Attention was turned to the left upper quadrant where a 8  mm midclavicular assistant airseal trocar was placed. ( All incision sites were injected with 10cc of ropivacaine prior to port placement. )    Once all ports had been placed under direct visualization.The laparoscope was removed and the [place] robotic system was thin right-sided docked. The robotic arms were connected to the corresponding trocars as listed above. The laparoscope was then reinserted. The PK bipolar cautery was placed into port #2. The monopolar scissor placed in the port #1. A prograsp was placed in port #3. All instruments were directed into the pelvis under direct visualization.   Attention was turned to the surgeons console.. The left mesosalpinx and left utero-ovarian ligament was cauterized with PK and excised with scissors.  The broad ligament was cauterized with PK incised with scissors. The round ligament was cauterized with the PK incised with scissors. The anterior leaf of broad ligament was incised along the bladder reflection to the midline.  The right utero-ovarian ligament was cauterized with PK and excised with scissors. The right broad  ligament was cauterized with PK excised scissors. The right round ligament was cauterized with PK and excised with scissors. The broad ligament was incised to the midline. The bladder was dissected off the lower uterine segments of the cervix via sharp and blunt dissection. The uterine arteries were skeleton bilaterally. They were cauterized with PK and transected. The KOH ring was identified. The anterior colpotomy was performed followed by the posterior colpotomy. The initial robotic dissection was performed from 9 am to 1030 am . Once the uterus,cervix and bilateral fallopian tubes were completely excised they were removed through the vagina . It was necessary to bivalve the uterus  Vaginally to facilitate removal. . The pk and scissors were removed and log tip forceps were placed in the port #1 and the cutting needle driver was placed in to port #2.The vaginal cuff was closed with 0 V lock suture in a running fashion.   The pelvis was irrigated.  Marland Kitchen.Excellent hemostasis was noted. All pelvic pedicles were examined and hemostasis was noted. Both ureters were identified and noted to peristals.   Inter seed was placed along the vaginal cuff. All instruments removed from the ports. All ports were removed under direct Visualization. The pneumoperitoneum was released. The fascia of the 12 mm umbilical port was closed with 0 Vicryl .  The skin incisions were closed with 4-0 Vicryl and then covered with Derma bond.  I  Sponge lap and needle counts were correct x. The patient was awakened from anesthesia and taken to the recovery room in stable condition.

## 2017-01-20 NOTE — Anesthesia Postprocedure Evaluation (Signed)
Anesthesia Post Note  Patient: Monica [name]  Procedure(s) Performed: Procedure(s) (LRB): ROBOTIC ASSISTED TOTAL HYSTERECTOMY WITH SALPINGECTOMY (Left)     Patient location during evaluation: PACU Anesthesia Type: General Level of consciousness: awake and alert Pain management: pain level controlled Vital Signs Assessment: post-procedure vital signs reviewed and stable Respiratory status: spontaneous breathing, nonlabored ventilation, respiratory function stable and patient connected to nasal cannula oxygen Cardiovascular status: blood pressure returned to baseline and stable Postop Assessment: no signs of nausea or vomiting Anesthetic complications: no    Last Vitals:  Vitals:   01/20/17 1445 01/20/17 1546  BP: 123/70 116/65  Pulse:  80  Resp: 16 16  Temp: 36.8 C 36.8 C    Last Pain:  Vitals:   01/20/17 1600  TempSrc:   PainSc: 4    Pain Goal: Patients Stated Pain Goal: 4 (01/20/17 1345)               Lyndle Herrlich EDWARD

## 2017-01-20 NOTE — Transfer of Care (Signed)
Immediate Anesthesia Transfer of Care Note  Patient: Monica [name]  Procedure(s) Performed: Procedure(s): ROBOTIC ASSISTED TOTAL HYSTERECTOMY WITH SALPINGECTOMY (Left)  Patient Location: PACU  Anesthesia Type:General  Level of Consciousness: sedated, drowsy and patient cooperative  Airway & Oxygen Therapy: Patient Spontanous Breathing and Patient connected to nasal cannula oxygen  Post-op Assessment: Report given to RN and Post -op Vital signs reviewed and stable  Post vital signs: Reviewed and stable  Last Vitals:  Vitals:   01/20/17 0730  BP: 121/83  Pulse: 71  Resp: 16  Temp: 36.7 C    Last Pain:  Vitals:   01/20/17 0730  TempSrc: Oral      Patients Stated Pain Goal: 4 (48/40/39 7953)  Complications: No apparent anesthesia complications

## 2017-01-21 ENCOUNTER — Encounter (HOSPITAL_COMMUNITY): Payer: Self-pay | Admitting: Obstetrics and Gynecology

## 2017-01-21 DIAGNOSIS — N920 Excessive and frequent menstruation with regular cycle: Secondary | ICD-10-CM | POA: Diagnosis not present

## 2017-01-21 LAB — CBC
HCT: 32.9 % — ABNORMAL LOW (ref 36.0–46.0)
Hemoglobin: 10.8 g/dL — ABNORMAL LOW (ref 12.0–15.0)
MCH: 29.4 pg (ref 26.0–34.0)
MCHC: 32.8 g/dL (ref 30.0–36.0)
MCV: 89.6 fL (ref 78.0–100.0)
Platelets: 230 10*3/uL (ref 150–400)
RBC: 3.67 MIL/uL — ABNORMAL LOW (ref 3.87–5.11)
RDW: 12.7 % (ref 11.5–15.5)
WBC: 10.3 10*3/uL (ref 4.0–10.5)

## 2017-01-21 MED ORDER — IBUPROFEN 800 MG PO TABS: 800.0000 mg | ORAL_TABLET | Freq: Three times a day (TID) | ORAL | 1 refills | Status: DC | PRN

## 2017-01-21 MED ORDER — OXYCODONE-ACETAMINOPHEN 5-325 MG PO TABS: 1.0000 | ORAL_TABLET | ORAL | 0 refills | Status: DC | PRN

## 2017-01-21 NOTE — Discharge Summary (Signed)
Physician Discharge Summary  Patient ID: Monica [name] MRN: [id number] DOB/AGE: August 06, 1970 46 y.o.  Admit date: 01/20/2017 Discharge date: 01/21/2017  Admission Diagnoses: Menorrhagia and Uterine fibroids   Discharge Diagnoses:  Active Problems:   S/P laparoscopic hysterectomy   Discharged Condition: stable  Hospital Course: Pt was admitted for observation after robotic assisted laparoscopic hysterectomy with left salpingectomy. She did well postoperatively with return of bowel and bladder function.   Consults: None  Significant Diagnostic Studies: labs: HGB pod #1 10.8  Treatments: surgery: Robotic assisted laparoscopic hysterectomy with left salpingectomy   Discharge Exam: Blood pressure (!) 98/53, pulse 63, temperature 98.5 F (36.9 C), resp. rate 16, SpO2 100 %. General appearance: alert, cooperative and no distress Resp: normal effort  GI: soft appropriately tender nondistended +BS in all 4 quadrants  Extremities: extremities normal, atraumatic, no cyanosis or edema Incision/Wound:healing well   Disposition: Final discharge disposition not confirmed  Discharge Instructions     Remove dressing in 72 hours    Complete by:  As directed    Call MD for:  persistant nausea and vomiting    Complete by:  As directed    Call MD for:  redness, tenderness, or signs of infection (pain, swelling, redness, odor or green/yellow discharge around incision site)    Complete by:  As directed    Call MD for:  severe uncontrolled pain    Complete by:  As directed    Call MD for:  temperature >100.4    Complete by:  As directed    Diet - low sodium heart healthy    Complete by:  As directed    Driving Restrictions    Complete by:  As directed    Avoid driving for 1 week   Increase activity slowly    Complete by:  As directed    Lifting restrictions    Complete by:  As directed    Avoid lifting over 10 lbs   Sexual Activity Restrictions    Complete by:  As directed    Avoid sex  until approved by Dr. Landry Mellow     Allergies as of 01/21/2017   No Known Allergies     Medication List    STOP taking these medications   FERROUS SULFATE PO   tranexamic acid 650 MG Tabs tablet Commonly known as:  LYSTEDA     TAKE these medications   ALLEGRA PO Take 1 tablet by mouth daily as needed (ALLERGIES).   b complex vitamins tablet Take 1 tablet by mouth daily.   ibuprofen 800 MG tablet Commonly known as:  ADVIL,MOTRIN Take 1 tablet (800 mg total) by mouth every 8 (eight) hours as needed (mild pain).   ketotifen 0.025 % ophthalmic solution Commonly known as:  ZADITOR Place 1 drop into both eyes 2 (two) times daily as needed (ALLERGIES/ITCHY EYES).   oxyCODONE-acetaminophen 5-325 MG tablet Commonly known as:  PERCOCET/ROXICET Take 1-2 tablets by mouth every 4 (four) hours as needed (moderate to severe pain (when tolerating fluids)).      Follow-up Information    [name], MD. Go in 2 week(s).   Specialty:  Obstetrics and Gynecology Why:  patient already has an appointment scheduled  Contact information: [place] [address] [phone]           Signed: Catha Brow. 01/21/2017, 7:49 AM

## 2017-01-21 NOTE — Anesthesia Postprocedure Evaluation (Signed)
Anesthesia Post Note  Patient: Monica [name]  Procedure(s) Performed: Procedure(s) (LRB): ROBOTIC ASSISTED TOTAL HYSTERECTOMY WITH SALPINGECTOMY (Left)     Patient location during evaluation: [facility] Anesthesia Type: General Level of consciousness: awake and alert and oriented Pain management: pain level controlled Vital Signs Assessment: post-procedure vital signs reviewed and stable Respiratory status: nonlabored ventilation, respiratory function stable and patient connected to nasal cannula oxygen Cardiovascular status: stable Postop Assessment: no signs of nausea or vomiting and adequate PO intake Anesthetic complications: no    Last Vitals:  Vitals:   01/20/17 2329 01/21/17 0350  BP: 98/60 (!) 98/53  Pulse: 82 63  Resp: 16 16  Temp: 37.1 C 36.9 C    Last Pain:  Vitals:   01/21/17 0618  TempSrc:   PainSc: 4    Pain Goal: Patients Stated Pain Goal: 3 (01/21/17 0618)               Willa Rough

## 2017-01-21 NOTE — Addendum Note (Signed)
Addendum  created 01/21/17 0742 by Jonna Munro, CRNA   Sign clinical note

## 2017-02-07 ENCOUNTER — Encounter (HOSPITAL_COMMUNITY)
Admission: EM | Disposition: A | Discharge status: Home / Self Care | Payer: Self-pay | Source: Home / Self Care | Attending: Emergency Medicine

## 2017-02-07 ENCOUNTER — Encounter (HOSPITAL_BASED_OUTPATIENT_CLINIC_OR_DEPARTMENT_OTHER): Payer: Self-pay | Admitting: Emergency Medicine

## 2017-02-07 ENCOUNTER — Inpatient Hospital Stay (HOSPITAL_COMMUNITY): Payer: Managed Care, Other (non HMO) | Admitting: Anesthesiology

## 2017-02-07 ENCOUNTER — Observation Stay (HOSPITAL_BASED_OUTPATIENT_CLINIC_OR_DEPARTMENT_OTHER)
Admission: EM | Admit: 2017-02-07 | Discharge: 2017-02-08 | Disposition: A | Discharge status: Home / Self Care | Payer: Managed Care, Other (non HMO) | Attending: Obstetrics and Gynecology | Admitting: Obstetrics and Gynecology

## 2017-02-07 DIAGNOSIS — Z79891 Long term (current) use of opiate analgesic: Secondary | ICD-10-CM | POA: Insufficient documentation

## 2017-02-07 DIAGNOSIS — D649 Anemia, unspecified: Secondary | ICD-10-CM | POA: Diagnosis not present

## 2017-02-07 DIAGNOSIS — M542 Cervicalgia: Secondary | ICD-10-CM | POA: Insufficient documentation

## 2017-02-07 DIAGNOSIS — I951 Orthostatic hypotension: Secondary | ICD-10-CM

## 2017-02-07 DIAGNOSIS — N939 Abnormal uterine and vaginal bleeding, unspecified: Secondary | ICD-10-CM

## 2017-02-07 DIAGNOSIS — G4730 Sleep apnea, unspecified: Secondary | ICD-10-CM | POA: Insufficient documentation

## 2017-02-07 DIAGNOSIS — Z79899 Other long term (current) drug therapy: Secondary | ICD-10-CM | POA: Insufficient documentation

## 2017-02-07 DIAGNOSIS — N920 Excessive and frequent menstruation with regular cycle: Secondary | ICD-10-CM | POA: Diagnosis not present

## 2017-02-07 DIAGNOSIS — M1990 Unspecified osteoarthritis, unspecified site: Secondary | ICD-10-CM | POA: Diagnosis not present

## 2017-02-07 DIAGNOSIS — Z90710 Acquired absence of both cervix and uterus: Secondary | ICD-10-CM | POA: Diagnosis not present

## 2017-02-07 DIAGNOSIS — N898 Other specified noninflammatory disorders of vagina: Secondary | ICD-10-CM | POA: Diagnosis not present

## 2017-02-07 HISTORY — PX: REPAIR VAGINAL CUFF: SHX6067

## 2017-02-07 LAB — CBC
HCT: 36 % (ref 36.0–46.0)
HCT: 33.7 % — ABNORMAL LOW (ref 36.0–46.0)
HCT: 26.4 % — ABNORMAL LOW (ref 36.0–46.0)
Hemoglobin: 12 g/dL (ref 12.0–15.0)
Hemoglobin: 11 g/dL — ABNORMAL LOW (ref 12.0–15.0)
Hemoglobin: 8.8 g/dL — ABNORMAL LOW (ref 12.0–15.0)
MCH: 29.1 pg (ref 26.0–34.0)
MCH: 28.6 pg (ref 26.0–34.0)
MCH: 29.2 pg (ref 26.0–34.0)
MCHC: 33.3 g/dL (ref 30.0–36.0)
MCHC: 32.6 g/dL (ref 30.0–36.0)
MCHC: 33.3 g/dL (ref 30.0–36.0)
MCV: 87.2 fL (ref 78.0–100.0)
MCV: 87.5 fL (ref 78.0–100.0)
MCV: 87.7 fL (ref 78.0–100.0)
Platelets: 354 10*3/uL (ref 150–400)
Platelets: 370 10*3/uL (ref 150–400)
Platelets: 289 10*3/uL (ref 150–400)
RBC: 4.13 MIL/uL (ref 3.87–5.11)
RBC: 3.85 MIL/uL — ABNORMAL LOW (ref 3.87–5.11)
RBC: 3.01 MIL/uL — ABNORMAL LOW (ref 3.87–5.11)
RDW: 12.1 % (ref 11.5–15.5)
RDW: 12.5 % (ref 11.5–15.5)
RDW: 12.4 % (ref 11.5–15.5)
WBC: 11.1 10*3/uL — ABNORMAL HIGH (ref 4.0–10.5)
WBC: 23.2 10*3/uL — ABNORMAL HIGH (ref 4.0–10.5)
WBC: 22 10*3/uL — ABNORMAL HIGH (ref 4.0–10.5)

## 2017-02-07 LAB — BASIC METABOLIC PANEL
Anion gap: 9 (ref 5–15)
BUN: 12 mg/dL (ref 6–20)
CO2: 24 mmol/L (ref 22–32)
Calcium: 8.8 mg/dL — ABNORMAL LOW (ref 8.9–10.3)
Chloride: 103 mmol/L (ref 101–111)
Creatinine, Ser: 0.67 mg/dL (ref 0.44–1.00)
GFR calc Af Amer: >60 mL/min (ref >60)
GFR calc non Af Amer: >60 mL/min (ref >60)
Glucose, Bld: 98 mg/dL (ref 65–99)
Potassium: 3.4 mmol/L — ABNORMAL LOW (ref 3.5–5.1)
Sodium: 136 mmol/L (ref 135–145)

## 2017-02-07 LAB — PROTIME-INR
INR: 0.97
Prothrombin Time: 12.9 seconds (ref 11.4–15.2)

## 2017-02-07 LAB — PREPARE RBC (CROSSMATCH)

## 2017-02-07 SURGERY — REPAIR, VAGINAL CUFF
Anesthesia: General | Site: Vagina

## 2017-02-07 MED ORDER — FAMOTIDINE IN NACL 20-0.9 MG/50ML-% IV SOLN
20.0000 mg | Freq: Once | INTRAVENOUS | Status: AC
  Administered 2017-02-07: 20 mg via INTRAVENOUS
  Filled 2017-02-07: qty 50

## 2017-02-07 MED ORDER — METOCLOPRAMIDE HCL 5 MG/ML IJ SOLN
INTRAMUSCULAR | Status: AC
  Filled 2017-02-07: qty 2

## 2017-02-07 MED ORDER — PROPOFOL 10 MG/ML IV BOLUS
INTRAVENOUS | Status: AC
  Filled 2017-02-07: qty 20

## 2017-02-07 MED ORDER — LIDOCAINE HCL (PF) 1 % IJ SOLN
INTRAMUSCULAR | Status: AC
  Filled 2017-02-07: qty 30

## 2017-02-07 MED ORDER — PROPOFOL 10 MG/ML IV BOLUS
INTRAVENOUS | Status: DC | PRN
  Administered 2017-02-07: 150 mg via INTRAVENOUS

## 2017-02-07 MED ORDER — FENTANYL CITRATE (PF) 100 MCG/2ML IJ SOLN: 25.0000 ug | INTRAMUSCULAR | Status: DC | PRN

## 2017-02-07 MED ORDER — ONDANSETRON HCL 4 MG/2ML IJ SOLN
INTRAMUSCULAR | Status: DC | PRN
  Administered 2017-02-07: 4 mg via INTRAVENOUS

## 2017-02-07 MED ORDER — METOCLOPRAMIDE HCL 5 MG/ML IJ SOLN
INTRAMUSCULAR | Status: DC | PRN
  Administered 2017-02-07: 10 mg via INTRAVENOUS

## 2017-02-07 MED ORDER — DEXAMETHASONE SODIUM PHOSPHATE 10 MG/ML IJ SOLN
INTRAMUSCULAR | Status: AC
  Filled 2017-02-07: qty 1

## 2017-02-07 MED ORDER — LIDOCAINE HCL 1 % IJ SOLN
INTRAMUSCULAR | Status: AC
  Filled 2017-02-07: qty 20

## 2017-02-07 MED ORDER — SODIUM CHLORIDE 0.9 % IV SOLN
INTRAVENOUS | Status: DC | PRN
  Administered 2017-02-07 (×4): 80 ug via INTRAVENOUS

## 2017-02-07 MED ORDER — SUCCINYLCHOLINE CHLORIDE 200 MG/10ML IV SOSY
PREFILLED_SYRINGE | INTRAVENOUS | Status: AC
  Filled 2017-02-07: qty 10

## 2017-02-07 MED ORDER — SODIUM CHLORIDE 0.9 % IV SOLN: Freq: Once | INTRAVENOUS | Status: DC

## 2017-02-07 MED ORDER — SCOPOLAMINE 1 MG/3DAYS TD PT72
MEDICATED_PATCH | TRANSDERMAL | Status: DC | PRN
  Administered 2017-02-07: 1 via TRANSDERMAL

## 2017-02-07 MED ORDER — MIDAZOLAM HCL 2 MG/2ML IJ SOLN
INTRAMUSCULAR | Status: DC | PRN
  Administered 2017-02-07: 1 mg via INTRAVENOUS

## 2017-02-07 MED ORDER — SODIUM CHLORIDE 0.9 % IV BOLUS (SEPSIS)
1000.0000 mL | Freq: Once | INTRAVENOUS | Status: AC
  Administered 2017-02-07: 1000 mL via INTRAVENOUS

## 2017-02-07 MED ORDER — LIDOCAINE HCL (CARDIAC) 20 MG/ML IV SOLN
INTRAVENOUS | Status: DC | PRN
  Administered 2017-02-07: 80 mg via INTRAVENOUS

## 2017-02-07 MED ORDER — FENTANYL CITRATE (PF) 100 MCG/2ML IJ SOLN
INTRAMUSCULAR | Status: DC | PRN
  Administered 2017-02-07 (×3): 50 ug via INTRAVENOUS

## 2017-02-07 MED ORDER — ONDANSETRON HCL 4 MG/2ML IJ SOLN
INTRAMUSCULAR | Status: AC
  Filled 2017-02-07: qty 2

## 2017-02-07 MED ORDER — DEXAMETHASONE SODIUM PHOSPHATE 4 MG/ML IJ SOLN
INTRAMUSCULAR | Status: DC | PRN
  Administered 2017-02-07: 10 mg via INTRAVENOUS

## 2017-02-07 MED ORDER — LACTATED RINGERS IV SOLN
INTRAVENOUS | Status: DC | PRN
  Administered 2017-02-07 (×3): via INTRAVENOUS

## 2017-02-07 MED ORDER — LIDOCAINE HCL (CARDIAC) 20 MG/ML IV SOLN
INTRAVENOUS | Status: AC
  Filled 2017-02-07: qty 5

## 2017-02-07 MED ORDER — MIDAZOLAM HCL 2 MG/2ML IJ SOLN
INTRAMUSCULAR | Status: AC
  Filled 2017-02-07: qty 2

## 2017-02-07 MED ORDER — FENTANYL CITRATE (PF) 250 MCG/5ML IJ SOLN
INTRAMUSCULAR | Status: AC
  Filled 2017-02-07: qty 5

## 2017-02-07 MED ORDER — SUCCINYLCHOLINE CHLORIDE 20 MG/ML IJ SOLN
INTRAMUSCULAR | Status: DC | PRN
  Administered 2017-02-07: 100 mg via INTRAVENOUS

## 2017-02-07 MED ORDER — ROCURONIUM BROMIDE 100 MG/10ML IV SOLN
INTRAVENOUS | Status: AC
  Filled 2017-02-07: qty 1

## 2017-02-07 MED ORDER — PROMETHAZINE HCL 25 MG/ML IJ SOLN: 6.2500 mg | INTRAMUSCULAR | Status: DC | PRN

## 2017-02-07 MED ORDER — SCOPOLAMINE 1 MG/3DAYS TD PT72
MEDICATED_PATCH | TRANSDERMAL | Status: AC
  Filled 2017-02-07: qty 1

## 2017-02-07 SURGICAL SUPPLY — 20 items
ELECT REM PT RETURN 9FT ADLT (ELECTROSURGICAL) ×3
ELECTRODE REM PT RTRN 9FT ADLT (ELECTROSURGICAL) ×1 IMPLANT
GLOVE BIOGEL PI IND STRL 6.5 (GLOVE) ×1 IMPLANT
GLOVE BIOGEL PI IND STRL 7.0 (GLOVE) ×1 IMPLANT
GLOVE BIOGEL PI IND STRL 8.5 (GLOVE) ×1 IMPLANT
GLOVE BIOGEL PI INDICATOR 6.5 (GLOVE) ×2
GLOVE BIOGEL PI INDICATOR 7.0 (GLOVE) ×2
GLOVE BIOGEL PI INDICATOR 8.5 (GLOVE) ×2
GLOVE ECLIPSE 8.0 STRL XLNG CF (GLOVE) ×3 IMPLANT
GLOVE SKINSENSE NS SZ6.5 (GLOVE) ×2
GLOVE SKINSENSE STRL SZ6.5 (GLOVE) ×1 IMPLANT
GOWN SRG LRG LVL 3 NONREINFORC (GOWNS) ×2 IMPLANT
GOWN STRL NON-REIN LRG LVL3 (GOWNS) ×4
GOWN SURG XXL (GOWNS) ×3 IMPLANT
IV SOD CHL 0.9% 1000ML (IV SOLUTION) ×3 IMPLANT
PACK VAGINAL MINOR WOMEN LF (CUSTOM PROCEDURE TRAY) ×3 IMPLANT
SUT VIC AB 0 CT1 27 (SUTURE) ×4
SUT VIC AB 0 CT1 27XBRD ANBCTR (SUTURE) ×2 IMPLANT
TOWEL OR 17X24 6PK STRL BLUE (TOWEL DISPOSABLE) ×3 IMPLANT
YANKAUER SUCT BULB TIP NO VENT (SUCTIONS) ×3 IMPLANT

## 2017-02-07 NOTE — MAU Note (Signed)
Pt presents to [facility] for heavy vaginal bleeding that started this morning. Pt has robotic hysterectomy on July the 11th. Denies pain at present.

## 2017-02-07 NOTE — Progress Notes (Signed)
Subjective:  Patient reports tolerating PO.  Feels a little light headed.  Objective:  BP 102/60   Pulse 72   Temp 98.7 F (37.1 C) (Oral)   Resp 17   Ht 5\' 8"  (1.727 m)   Wt 87.1 kg (192 lb)   LMP 12/21/2016 (Exact Date)   SpO2 98%   BMI 29.19 kg/m   HEENT: no distress Abd: nontender Bleeding: spots  CBC    Component Value Date/Time   WBC 22.0 (H) 02/07/2017 1755   RBC 3.01 (L) 02/07/2017 1755   HGB 8.8 (L) 02/07/2017 1755   HCT 26.4 (L) 02/07/2017 1755   PLT 289 02/07/2017 1755   MCV 87.7 02/07/2017 1755   MCH 29.2 02/07/2017 1755   MCHC 33.3 02/07/2017 1755   RDW 12.4 02/07/2017 1755   Assessment/Plan:  The patient was orthostatic after her surgery. I will transfuse two units of PRBC's. Plan discharge in the morning.   LOS: 0 days    Monica [name] 02/07/2017, 8:38 PM

## 2017-02-07 NOTE — Anesthesia Procedure Notes (Signed)
Procedure Name: Intubation Date/Time: 02/07/2017 3:43 PM Performed by: Flossie Dibble Pre-anesthesia Checklist: Patient identified, Emergency Drugs available, Suction available, Patient being monitored and Timeout performed Patient Re-evaluated:Patient Re-evaluated prior to induction Oxygen Delivery Method: Circle system utilized Preoxygenation: Pre-oxygenation with 100% oxygen Induction Type: IV induction, Rapid sequence and Cricoid Pressure applied Laryngoscope Size: Mac and 4 Grade View: Grade I Tube type: Oral Tube size: 7.0 mm Number of attempts: 1 Airway Equipment and Method: Stylet Placement Confirmation: ETT inserted through vocal cords under direct vision,  positive ETCO2 and breath sounds checked- equal and bilateral Secured at: 20.5 cm Dental Injury: Teeth and Oropharynx as per pre-operative assessment

## 2017-02-07 NOTE — Op Note (Signed)
OPERATIVE NOTE  Monica [name]  DOB:    [birth year]  MRN:    [id number]  CSN:    [id number]  Date of Surgery:  02/07/2017  Preoperative Diagnosis:  Heavy vaginal bleeding Vaginal cuff separation Status post robotic hysterectomy on 01/20/2017 Anemia ( hemoglobin equals 11)  Postoperative Diagnosis:  Same  Procedure:  Examination under anesthesia Repair of the vaginal cuff  Surgeon:  Gildardo Cranker, M.D.  Assistant:  None  Anesthetic:  General  Disposition:  The patient is a 46 year old female who had a robotic hysterectomy on 01/20/2017. She had an episode of bleeding with a single large clot on 02/03/2017. They bleeding resolved. On 02/07/2017 she began having very heavy vaginal bleeding. She lost approximately 2000 cc of blood prior to presenting to the operating room. The patient understands the indications for surgery. She accepts the risk of, but not limited to, anesthetic complications, bleeding, infections, and possible damage to the surrounding organs.  Findings:  The vaginal cuff was noted to have separation across his entire length. There is no evidence of infection.  Procedure:  The patient was taken to the operating room where a general anesthetic was given. A timeout was performed. The patient's perineum and vagina were prepped with Betadine. The bladder was drained of urine. The patient was sterilely draped. An examination under anesthesia was performed. 10 cc of 1% Xylocaine had previously been injected into the vaginal cuff prior to coming to the operating room. A weighted speculum was placed in vagina. Clamps were used to isolate the entire vaginal cuff. Interrupted sutures of 0 Vicryl were used to reapproximate the vaginal cuff. At the end of the procedure, the cuff was well approximated. Hemostasis was adequate. The estimated blood loss was 100 cc in the operating room. The patient received 1000 cc of IV fluid. Sponge, needle, and isthmic counts  were correct on 2 occasions. The patient was awakened from her anesthetic without difficulty. She was transported to the recovery room in stable condition.  Followup instructions:  The patient will contact Dr. Landry Mellow on 02/08/2017. She will receive instructions for when she should present for reevaluation. We will repeat a CBC and orthostatic blood pressures and pulses in the recovery room. A decision will be made at that point about whether or not a blood transfusion is advisable. The risk and benefits of blood transfusion were reviewed with the patient and her husband.  Discharge medications:  The patient will continue ibuprofen and Percocet as previously prescribed.  Gildardo Cranker, M.D.  02/07/2017 4:42 PM

## 2017-02-07 NOTE — Discharge Instructions (Signed)
Anemia, Nonspecific Anemia is a condition in which the concentration of red blood cells or hemoglobin in the blood is below normal. Hemoglobin is a substance in red blood cells that carries oxygen to the tissues of the body. Anemia results in not enough oxygen reaching these tissues. What are the causes? Common causes of anemia include:  Excessive bleeding. Bleeding may be internal or external. This includes excessive bleeding from periods (in women) or from the intestine.  Poor nutrition.  Chronic kidney, thyroid, and liver disease.  Bone marrow disorders that decrease red blood cell production.  Cancer and treatments for cancer.  HIV, AIDS, and their treatments.  Spleen problems that increase red blood cell destruction.  Blood disorders.  Excess destruction of red blood cells due to infection, medicines, and autoimmune disorders. What are the signs or symptoms?  Minor weakness.  Dizziness.  Headache.  Palpitations.  Shortness of breath, especially with exercise.  Paleness.  Cold sensitivity.  Indigestion.  Nausea.  Difficulty sleeping.  Difficulty concentrating. Symptoms may occur suddenly or they may develop slowly. How is this diagnosed? Additional blood tests are often needed. These help your health care provider determine the best treatment. Your health care provider will check your stool for blood and look for other causes of blood loss. How is this treated? Treatment varies depending on the cause of the anemia. Treatment can include:  Supplements of iron, vitamin B12, or folic acid.  Hormone medicines.  A blood transfusion. This may be needed if blood loss is severe.  Hospitalization. This may be needed if there is significant continual blood loss.  Dietary changes.  Spleen removal. Follow these instructions at home: Keep all follow-up appointments. It often takes many weeks to correct anemia, and having your health care provider check on your  condition and your response to treatment is very important. Get help right away if:  You develop extreme weakness, shortness of breath, or chest pain.  You become dizzy or have trouble concentrating.  You develop heavy vaginal bleeding.  You develop a rash.  You have bloody or black, tarry stools.  You faint.  You vomit up blood.  You vomit repeatedly.  You have abdominal pain.  You have a fever or persistent symptoms for more than 2-3 days.  You have a fever and your symptoms suddenly get worse.  You are dehydrated. This information is not intended to replace advice given to you by your health care provider. Make sure you discuss any questions you have with your health care provider. Document Released: 08/06/2004 Document Revised: 12/11/2015 Document Reviewed: 12/23/2012 Elsevier Interactive Patient Education  2017 [employer]  

## 2017-02-07 NOTE — H&P (Signed)
Chief Complaint: Vaginal Bleeding   None    SUBJECTIVE HPI: Monica [name] is a 46 y.o. presents [facility] reporting vaginal bleeding since 1030 am. Patient s/p robotic assisted laparoscopic hysterectomy 01/20/17 per Dr. Landry Mellow.  Has done well post op, had one prior brief episode of bleeding with clots.  Today started with heavier bleeding with large clots.  Has been eating and drinking. Normal appetite. Having regular bms.Saturating pad q 30 min to 1 hr. No abd pain. No other abnormal bruising or bleeding. No faintness/dizziness. Pt went to [facility] and was evaluated.  Dr. Ashok Cordia evaluated her and she was noted to have bleeding coming from cuff.  Was transported to [facility].  Pt was given 1 liter of fluid in the ER.  Location: Vaginal bleeding Quality: Moderate to heavy Duration: 1030 am  Past Medical History:  Diagnosis Date  . Arthritis    lower back  . Sleep apnea    borderline, no cpap ordered, encouraged to loss weight   OB History  Gravida Para Term Preterm AB Living  2 2          SAB TAB Ectopic Multiple Live Births               # Outcome Date GA Lbr Len/2nd Weight Sex Delivery Anes PTL Lv  2 Para           1 Para              Past Surgical History:  Procedure Laterality Date  . HYMENECTOMY    . ROBOTIC ASSISTED TOTAL HYSTERECTOMY WITH SALPINGECTOMY Left 01/20/2017   Procedure: ROBOTIC ASSISTED TOTAL HYSTERECTOMY WITH SALPINGECTOMY;  Surgeon: [name], MD;  Location: [facility];  Service: Gynecology;  Laterality: Left;   Social History   Social History  . Marital status: Married    Spouse name: N/A  . Number of children: N/A  . Years of education: N/A   Occupational History  . Not on file.   Social History Main Topics  . Smoking status: Never Smoker  . Smokeless tobacco: Never Used  . Alcohol use Yes     Comment: occ  . Drug use: No  . Sexual activity: Not on file   Other Topics Concern  . Not on file   Social History Narrative  . No narrative on file    No family history on file. No current facility-administered medications on file prior to encounter.    Current Outpatient Prescriptions on File Prior to Encounter  Medication Sig Dispense Refill  . b complex vitamins tablet Take 1 tablet by mouth daily.    Marland Kitchen Fexofenadine HCl (ALLEGRA PO) Take 1 tablet by mouth daily as needed (ALLERGIES).    Marland Kitchen ibuprofen (ADVIL,MOTRIN) 800 MG tablet Take 1 tablet (800 mg total) by mouth every 8 (eight) hours as needed (mild pain). 30 tablet 1  . ketotifen (ZADITOR) 0.025 % ophthalmic solution Place 1 drop into both eyes 2 (two) times daily as needed (ALLERGIES/ITCHY EYES).    Marland Kitchen oxyCODONE-acetaminophen (PERCOCET/ROXICET) 5-325 MG tablet Take 1-2 tablets by mouth every 4 (four) hours as needed (moderate to severe pain (when tolerating fluids)). 30 tablet 0   No Known Allergies  I have reviewed patient's Past Medical Hx, Surgical Hx, Family Hx, Social Hx, medications and allergies.   Review of Systems  Constitutional: Negative.   HENT: Negative.   Eyes: Negative.   Respiratory: Negative.   Cardiovascular: Negative.   Gastrointestinal: Negative.   Genitourinary:  Vaginal bleeding with clots  Musculoskeletal: Negative.   Skin: Negative.   Neurological: Negative.   Psychiatric/Behavioral: Negative.    Review of Systems  Constitutional: Negative.   HENT: Negative.   Eyes: Negative.   Respiratory: Negative.   Cardiovascular: Negative.   Gastrointestinal: Negative.   Genitourinary:       Vaginal bleeding with clots  Musculoskeletal: Negative.   Skin: Negative.   Neurological: Negative.   Endo/Heme/Allergies: Negative.   Psychiatric/Behavioral: Negative.     OBJECTIVE Patient Vitals for the past 24 hrs:  BP Temp Temp src Pulse Resp SpO2 Height Weight  02/07/17 1419 134/87 98.6 F (37 C) - 76 18 - - -  02/07/17 1330 123/83 - - 70 15 100 % - -  02/07/17 1300 130/77 - - 65 20 100 % - -  02/07/17 1237 (!) 131/94 - - 66 17 100 % - -   02/07/17 1230 119/82 - - 73 19 99 % - -  02/07/17 1200 (!) 144/93 - - 69 13 100 % - -  02/07/17 1132 116/84 98.6 F (37 C) Oral 74 18 100 % 5\' 8"  (1.727 m) 87.1 kg (192 lb)   Constitutional: Well-developed, well-nourished female in no acute distress.  Cardiovascular: normal rate Respiratory: normal rate and effort.  GI: Abd soft, non-tender, gravid appropriate for gestational age. Pos BS x 4 MS: Extremities nontender, no edema, normal ROM Neurologic: Alert and oriented x 4.  GU: deferred  LAB RESULTS Results for orders placed or performed during the hospital encounter of 02/07/17 (from the past 24 hour(s))  CBC     Status: Abnormal   Collection Time: 02/07/17 11:27 AM  Result Value Ref Range   WBC 11.1 (H) 4.0 - 10.5 K/uL   RBC 4.13 3.87 - 5.11 MIL/uL   Hemoglobin 12.0 12.0 - 15.0 g/dL   HCT 36.0 36.0 - 46.0 %   MCV 87.2 78.0 - 100.0 fL   MCH 29.1 26.0 - 34.0 pg   MCHC 33.3 30.0 - 36.0 g/dL   RDW 12.1 11.5 - 15.5 %   Platelets 354 150 - 400 K/uL  Basic metabolic panel     Status: Abnormal   Collection Time: 02/07/17 11:27 AM  Result Value Ref Range   Sodium 136 135 - 145 mmol/L   Potassium 3.4 (L) 3.5 - 5.1 mmol/L   Chloride 103 101 - 111 mmol/L   CO2 24 22 - 32 mmol/L   Glucose, Bld 98 65 - 99 mg/dL   BUN 12 6 - 20 mg/dL   Creatinine, Ser 0.67 0.44 - 1.00 mg/dL   Calcium 8.8 (L) 8.9 - 10.3 mg/dL   GFR calc non Af Amer >60 >60 mL/min   GFR calc Af Amer >60 >60 mL/min   Anion gap 9 5 - 15  Protime-INR     Status: None   Collection Time: 02/07/17 12:50 PM  Result Value Ref Range   Prothrombin Time 12.9 11.4 - 15.2 seconds   INR 0.97     IMAGING No results found.  MAU COURSE Orders Placed This Encounter  Procedures  . CBC  . Basic metabolic panel  . Protime-INR  . CBC  . Diet NPO time specified  . Consult to obstetrics / gynecology  . Type and screen  . Insert peripheral IV   Meds ordered this encounter  Medications  . sodium chloride 0.9 % bolus 1,000  mL    MDM CBC, TYSC, IV fluids ASSESSMENT 1. Vagina bleeding  PLAN Dr. Raphael Gibney given report of pt bleeding.  Will take pt to OR to evaluate bleeding source.  Von Willebrand workup.     Starla Link, CNM 02/07/2017  2:22 PM

## 2017-02-07 NOTE — Transfer of Care (Signed)
Immediate Anesthesia Transfer of Care Note  Patient: Monica [name]  Procedure(s) Performed: Procedure(s): REPAIR VAGINAL CUFF (N/A)  Patient Location: PACU  Anesthesia Type:General  Level of Consciousness: awake, alert  and oriented  Airway & Oxygen Therapy: Patient Spontanous Breathing and Patient connected to nasal cannula oxygen  Post-op Assessment: Report given to RN and Post -op Vital signs reviewed and stable  Post vital signs: Reviewed and stable  Last Vitals:  Vitals:   02/07/17 1419 02/07/17 1519  BP: 134/87 123/70  Pulse: 76 82  Resp: 18   Temp: 37 C     Last Pain:  Vitals:   02/07/17 1334  TempSrc:   PainSc: 0-No pain         Complications: No apparent anesthesia complications

## 2017-02-07 NOTE — ED Notes (Signed)
Took pt over from [place], [place]. [facility] enroute for pt transport. Assessed pt on assuming care. Pt is A/Ox4, skin pink, warm, dry. Pt reports no symptoms while lying in bed. NT in room to clean pt and they report that pt still having significant bleeding with clots noted.

## 2017-02-07 NOTE — ED Provider Notes (Signed)
[facility] Provider Note   CSN: [id number] Arrival date & time: 02/07/17  1127     History   Chief Complaint Chief Complaint  Patient presents with  . Vaginal Bleeding    HPI Monica [name] is a 46 y.o. female.  Patient s/p robotic assisted laparoscopic hysterectomy 01/20/17, presents with heavy vaginal bleeding onset this AM.  Has done well post op, had one prior brief episode of bleeding.  Has been eating and drinking. Normal appetite. Having regular bms. Heavy bleeding onset this AM. Saturating pad q 30 min to 1 hr. No abd pain. No other abnormal bruising or bleeding. No faintness/dizziness.    The history is provided by the patient.  Vaginal Bleeding  Primary symptoms include vaginal bleeding. Pertinent negatives include no abdominal pain.    Past Medical History:  Diagnosis Date  . Arthritis    lower back  . Sleep apnea    borderline, no cpap ordered, encouraged to loss weight    Patient Active Problem List   Diagnosis Date Noted  . S/P laparoscopic hysterectomy 01/20/2017    Past Surgical History:  Procedure Laterality Date  . HYMENECTOMY    . ROBOTIC ASSISTED TOTAL HYSTERECTOMY WITH SALPINGECTOMY Left 01/20/2017   Procedure: ROBOTIC ASSISTED TOTAL HYSTERECTOMY WITH SALPINGECTOMY;  Surgeon: [name], MD;  Location: [facility];  Service: Gynecology;  Laterality: Left;    OB History    No data available       Home Medications    Prior to Admission medications   Medication Sig Start Date End Date Taking? Authorizing Provider  b complex vitamins tablet Take 1 tablet by mouth daily.    [provider]  Fexofenadine HCl (ALLEGRA PO) Take 1 tablet by mouth daily as needed (ALLERGIES).    [provider]  ibuprofen (ADVIL,MOTRIN) 800 MG tablet Take 1 tablet (800 mg total) by mouth every 8 (eight) hours as needed (mild pain). 01/21/17   [name], MD  ketotifen (ZADITOR) 0.025 % ophthalmic solution Place 1 drop into both eyes 2 (two)  times daily as needed (ALLERGIES/ITCHY EYES).    [provider]  oxyCODONE-acetaminophen (PERCOCET/ROXICET) 5-325 MG tablet Take 1-2 tablets by mouth every 4 (four) hours as needed (moderate to severe pain (when tolerating fluids)). 01/21/17   [name], MD    Family History No family history on file.  Social History Social History  Substance Use Topics  . Smoking status: Never Smoker  . Smokeless tobacco: Never Used  . Alcohol use Yes     Comment: occ     Allergies   Patient has no known allergies.   Review of Systems Review of Systems  Constitutional: Negative for fever.  HENT: Negative for sore throat.   Eyes: Negative for redness.  Respiratory: Negative for shortness of breath.   Cardiovascular: Negative for chest pain.  Gastrointestinal: Negative for abdominal pain and blood in stool.  Genitourinary: Positive for vaginal bleeding. Negative for flank pain.  Musculoskeletal: Negative for back pain and neck pain.  Skin: Negative for rash.  Neurological: Negative for headaches.  Hematological: Does not bruise/bleed easily.  Psychiatric/Behavioral: Negative for confusion.     Physical Exam Updated Vital Signs BP (!) 144/93   Pulse 69   Temp 98.6 F (37 C) (Oral)   Resp 13   Ht 1.727 m (5\' 8" )   Wt 87.1 kg (192 lb)   LMP 12/21/2016 (Exact Date)   SpO2 100%   BMI 29.19 kg/m   Physical Exam  Constitutional:  She appears well-developed and well-nourished. No distress.  HENT:  Head: Atraumatic.  Eyes: Conjunctivae are normal. No scleral icterus.  Neck: Neck supple. No tracheal deviation present.  Cardiovascular: Normal rate, regular rhythm, normal heart sounds and intact distal pulses.   Pulmonary/Chest: Effort normal and breath sounds normal. No respiratory distress.  Abdominal: Soft. Normal appearance and bowel sounds are normal. She exhibits no distension. There is no tenderness.  Genitourinary:  Genitourinary Comments: Relatively brisk bleeding  from superior/12 o'clock position on pelvic exam, ?from cuff/surgical site, unable to visualize exact bleeding site.   Musculoskeletal: She exhibits no edema.  Neurological: She is alert.  Skin: Skin is warm and dry. No rash noted. She is not diaphoretic.  Psychiatric: She has a normal mood and affect.  Nursing note and vitals reviewed.    ED Treatments / Results  Labs (all labs ordered are listed, but only abnormal results are displayed) Labs Reviewed - No data to display  EKG  EKG Interpretation None       Radiology No results found.  Procedures Procedures (including critical care time)  Medications Ordered in ED Medications - No data to display   Initial Impression / Assessment and Plan / ED Course  I have reviewed the triage vital signs and the nursing notes.  Pertinent labs & imaging results that were available during my care of the patient were reviewed by me and considered in my medical decision making (see chart for details).  Iv.  Consult to ob/gyn for transfer to [facility].   Labs sent.  2nd large bore iv. Ns bolus.   Reviewed nursing notes and prior charts for additional history.   Discussed pt with Dr Cheryll Cockayne NP who is working with Dr Raphael Gibney today - she indicates was expecting patients at [facility], to send to [facility] - they will see there and take to OR.   Transfer to [employer].   Keep npo.   Final Clinical Impressions(s) / ED Diagnoses   Final diagnoses:  None    New Prescriptions New Prescriptions   No medications on file     Lajean Saver, MD 02/07/17 1255

## 2017-02-07 NOTE — Anesthesia Preprocedure Evaluation (Signed)
Anesthesia Evaluation  Patient identified by MRN, date of birth, ID band Patient awake    Reviewed: Allergy & Precautions, NPO status , Patient's Chart, lab work & pertinent test results  Airway Mallampati: II  TM Distance: >3 FB Neck ROM: Full    Dental  (+) Teeth Intact, Dental Advisory Given   Pulmonary sleep apnea ,    Pulmonary exam normal breath sounds clear to auscultation       Cardiovascular negative cardio ROS Normal cardiovascular exam Rhythm:Regular Rate:Normal     Neuro/Psych negative neurological ROS     GI/Hepatic negative GI ROS, Neg liver ROS,   Endo/Other  negative endocrine ROS  Renal/GU negative Renal ROS   Vaginal cuff bleeding s/p hysterectomy    Musculoskeletal  (+) Arthritis ,   Abdominal   Peds  Hematology  (+) Blood dyscrasia, anemia ,   Anesthesia Other Findings Day of surgery medications reviewed with the patient.  Reproductive/Obstetrics                             Anesthesia Physical Anesthesia Plan  ASA: II and emergent  Anesthesia Plan: General   Post-op Pain Management:    Induction: Intravenous and Rapid sequence  PONV Risk Score and Plan: 3 and Ondansetron, Dexamethasone, Midazolam and Scopolamine patch - Pre-op  Airway Management Planned: Oral ETT  Additional Equipment:   Intra-op Plan:   Post-operative Plan: Extubation in OR  Informed Consent: I have reviewed the patients History and Physical, chart, labs and discussed the procedure including the risks, benefits and alternatives for the proposed anesthesia with the patient or authorized representative who has indicated his/her understanding and acceptance.   Dental advisory given  Plan Discussed with: CRNA  Anesthesia Plan Comments: (Risks/benefits of general anesthesia discussed with patient including risk of damage to teeth, lips, gum, and tongue, nausea/vomiting, allergic reactions  to medications, and the possibility of heart attack, stroke and death.  All patient questions answered.  Patient wishes to proceed.)        Anesthesia Quick Evaluation

## 2017-02-07 NOTE — ED Triage Notes (Signed)
Pt had hysterectomy 2 weeks ago. Reports heavy vaginal bleeding that started this morning. Endorses mild abd cramping. Pt reports soaking through multiple pads.

## 2017-02-07 NOTE — ED Notes (Signed)
ED Provider at bedside. 

## 2017-02-07 NOTE — Progress Notes (Signed)
Subjective:  The patient is a 46 year old female who had a robotic hysterectomy on 01/20/2017. She presents today complaining of heavy vaginal bleeding.  Objective:  BP 134/87   Pulse 76   Temp 98.6 F (37 C)   Resp 18   Ht 5\' 8"  (1.727 m)   Wt 87.1 kg (192 lb)   LMP 12/21/2016 (Exact Date)   SpO2 100%   BMI 29.19 kg/m   CBC    Component Value Date/Time   WBC 23.2 (H) 02/07/2017 1430   RBC 3.85 (L) 02/07/2017 1430   HGB 11.0 (L) 02/07/2017 1430   HCT 33.7 (L) 02/07/2017 1430   PLT 370 02/07/2017 1430   MCV 87.5 02/07/2017 1430   MCH 28.6 02/07/2017 1430   MCHC 32.6 02/07/2017 1430   RDW 12.5 02/07/2017 1430   HEENT: No distress  Abdomen: Soft Pelvic exam:  External genitalia: Moderate to large amount of blood Vagina: Large amount of blood and clots in the vagina  Procedure:  The patient was placed in the in the lithotomy position. A speculum was placed in the vagina. 100 cc of blood clot were evacuated from the vagina. The estimated blood loss to this point was also documented at 600 cc. The vaginal cuff was injected with 10 cc of 1% Xylocaine without epinephrine. Bleeding was noted from along the entire line of the vaginal cuff. There was no evidence of infection or inflammation. We were unable to get adequate exposure so that we could repair the vagina in the emergency department. Decision was made to proceed to the operating room for repair of the upper vagina.  Assessment:  Status post robotic hysterectomy Heavy vaginal bleeding (estimated blood loss 800 cc in MAU)  Plan:  We will take the patient back to the operating room and repair the cuff of the vagina. The patient understands the indications for surgical procedure. She accepts the risk of, but not limited to, anesthetic complications, bleeding, infections, and possible damage to the surrounding organs.  Dr. Raphael Gibney 02/07/2017 3:22 PM

## 2017-02-08 LAB — CBC
HCT: 29.6 % — ABNORMAL LOW (ref 36.0–46.0)
HCT: 27.3 % — ABNORMAL LOW (ref 36.0–46.0)
HCT: 28.8 % — ABNORMAL LOW (ref 36.0–46.0)
Hemoglobin: 10.1 g/dL — ABNORMAL LOW (ref 12.0–15.0)
Hemoglobin: 9.2 g/dL — ABNORMAL LOW (ref 12.0–15.0)
Hemoglobin: 9.6 g/dL — ABNORMAL LOW (ref 12.0–15.0)
MCH: 29.2 pg (ref 26.0–34.0)
MCH: 28.9 pg (ref 26.0–34.0)
MCH: 28.8 pg (ref 26.0–34.0)
MCHC: 34.1 g/dL (ref 30.0–36.0)
MCHC: 33.7 g/dL (ref 30.0–36.0)
MCHC: 33.3 g/dL (ref 30.0–36.0)
MCV: 85.5 fL (ref 78.0–100.0)
MCV: 85.8 fL (ref 78.0–100.0)
MCV: 86.5 fL (ref 78.0–100.0)
Platelets: 292 10*3/uL (ref 150–400)
Platelets: 270 10*3/uL (ref 150–400)
Platelets: 287 10*3/uL (ref 150–400)
RBC: 3.46 MIL/uL — ABNORMAL LOW (ref 3.87–5.11)
RBC: 3.18 MIL/uL — ABNORMAL LOW (ref 3.87–5.11)
RBC: 3.33 MIL/uL — ABNORMAL LOW (ref 3.87–5.11)
RDW: 13.1 % (ref 11.5–15.5)
RDW: 13.2 % (ref 11.5–15.5)
RDW: 13.3 % (ref 11.5–15.5)
WBC: 16.5 10*3/uL — ABNORMAL HIGH (ref 4.0–10.5)
WBC: 15.4 10*3/uL — ABNORMAL HIGH (ref 4.0–10.5)
WBC: 13.6 10*3/uL — ABNORMAL HIGH (ref 4.0–10.5)

## 2017-02-08 MED ORDER — BUTALBITAL-APAP-CAFFEINE 50-325-40 MG PO TABS
2.0000 | ORAL_TABLET | Freq: Once | ORAL | Status: AC
  Administered 2017-02-08: 2 via ORAL
  Filled 2017-02-08: qty 2

## 2017-02-08 MED ORDER — IBUPROFEN 600 MG PO TABS
600.0000 mg | ORAL_TABLET | Freq: Four times a day (QID) | ORAL | Status: DC | PRN
  Administered 2017-02-08: 600 mg via ORAL
  Filled 2017-02-08: qty 1

## 2017-02-08 MED ORDER — OXYCODONE HCL 5 MG PO TABS: 5.0000 mg | ORAL_TABLET | ORAL | Status: DC | PRN

## 2017-02-08 NOTE — Addendum Note (Signed)
Addendum  created 02/08/17 1244 by Lucretia Kern D, CRNA   Charge Capture section accepted

## 2017-02-08 NOTE — Progress Notes (Signed)
Pt. Alert ox3 d/c instructions given and questions answered pt. Taking shower pt. Waiting for husband s/l x2 d/c via NT

## 2017-02-08 NOTE — Progress Notes (Signed)
Blood transfusion completed at the above time. No transfusion reactions observed. Assessments WDL as at now. Will keep monitoring.

## 2017-02-08 NOTE — Progress Notes (Addendum)
Dr. Simona Huh notified of Hbg of 9.6 (up from 9.2). MD to place discharge orders shortly.

## 2017-02-08 NOTE — Progress Notes (Addendum)
1 Day Post-Op Procedure(s) (LRB): REPAIR VAGINAL CUFF (N/A)  Subjective: Patient reports tolerating PO.   Patient reports neck pain. Minimal spotting from vagina.   Objective: I have reviewed patient's vital signs, intake and output and labs.  General: alert, cooperative and no distress GI: soft, non-tender; bowel sounds normal; no masses,  no organomegaly Extremities: no edema, redness or tenderness in the calves or thighs  Assessment: s/p Procedure(s): REPAIR VAGINAL CUFF (N/A): stable and progressing well  Plan:  plan to repeeat cbc at noon . if stable discharge home   Follow up in the office in 2 weeks   LOS: 0 days    Tramane Gorum J. 02/08/2017, 8:30 AM  CBC shows hgb 9.2. Vital signs are stable per nurse minimal spotting. Plan repeat hgb at 1800 if stable d/c home if unstable consider CT scan pelvis to evaluate for active bleeding.

## 2017-02-08 NOTE — Anesthesia Postprocedure Evaluation (Signed)
Anesthesia Post Note  Patient: Monica [name]  Procedure(s) Performed: Procedure(s) (LRB): REPAIR VAGINAL CUFF (N/A)     Patient location during evaluation: PACU Anesthesia Type: General Level of consciousness: awake and alert Pain management: pain level controlled Vital Signs Assessment: post-procedure vital signs reviewed and stable Respiratory status: spontaneous breathing, nonlabored ventilation, respiratory function stable and patient connected to nasal cannula oxygen Cardiovascular status: blood pressure returned to baseline and stable Postop Assessment: no signs of nausea or vomiting Anesthetic complications: no    Last Vitals:  Vitals:   02/08/17 0258 02/08/17 0622  BP: (!) 96/54 111/62  Pulse: (!) 57 68  Resp: 16 18  Temp: 36.9 C 36.6 C    Last Pain:  Vitals:   02/08/17 0622  TempSrc: Oral  PainSc:    Pain Goal:                 Catalina Gravel

## 2017-02-08 NOTE — Progress Notes (Signed)
Pt c/o headache unrelieved by Ibuprofen and a nap. Pain starts at base of skull and radiates over back of head.  She denies frequent headaches.   VSS.  Pt denies dizziness with ambulation.  Is having small amount of vaginal bleeding noticed on toilet paper, but only scant spotting on peripad.  No clots.  Most recent HgB 9.2, down from 10.1 at 6:00am today.  Phoned Dr. Landry Mellow with above update.  See order for repeat CBC at 18:00 today. Dr. Simona Huh to be on call at 17:00 - phone CBC results to Dr. Simona Huh.

## 2017-02-08 NOTE — Progress Notes (Signed)
Pt. D/c in w/c via NT with husband at side

## 2017-02-09 ENCOUNTER — Encounter (HOSPITAL_COMMUNITY): Payer: Self-pay | Admitting: Obstetrics and Gynecology

## 2017-02-09 LAB — TYPE AND SCREEN
ABO/RH(D): O NEG
Antibody Screen: NEGATIVE
Unit division: 0
Unit division: 0
Unit division: 0

## 2017-02-09 LAB — BPAM RBC
Blood Product Expiration Date: 201808162359
Blood Product Expiration Date: 201808162359
Blood Product Expiration Date: 201808242359
ISSUE DATE / TIME: 201807292019
ISSUE DATE / TIME: 201807300001
Unit Type and Rh: 9500
Unit Type and Rh: 9500
Unit Type and Rh: 9500

## 2017-03-04 NOTE — Discharge Summary (Signed)
Physician Discharge Summary  Patient ID: Monica [name] MRN: [id number] DOB/AGE: [birth year] 46 y.o.  Admit date: 02/07/2017 Discharge date: 02/08/2017  Admission Diagnoses: 1) vaginal bleeding 2) vaginal cuff dehiscence   Discharge Diagnoses:  Active Problems:   Vaginal bleeding   Anemia   Orthostasis   Discharged Condition: good  Hospital Course:  Patient s/p robotic assisted laparoscopic hysterectomy 01/20/17 per Dr. Landry Mellow. Presented to [facility] with heavy vaginal bleeding. She was diagnosed with vaginal cuff dehiscence. She was taken to the operating room by Dr.Vernon Raphael Gibney. The dehiscence was repaired with interrupted figure of eight suture.  She was transfused with 2 units pRBC.   Consults: None  Significant Diagnostic Studies: labs: HGB 8.8 pretranfusion.Marland Kitchen Post transfusion 9.6  Treatments: surgery: repair of vaginal cuff dehiscence   Disposition: 01-Home or Self Care  Discharge Instructions    Call MD for:  extreme fatigue    Complete by:  As directed    Call MD for:  persistant dizziness or light-headedness    Complete by:  As directed    Call MD for:  persistant nausea and vomiting    Complete by:  As directed    Call MD for:  redness, tenderness, or signs of infection (pain, swelling, redness, odor or green/yellow discharge around incision site)    Complete by:  As directed    Call MD for:  severe uncontrolled pain    Complete by:  As directed    Call MD for:  temperature >100.4    Complete by:  As directed    Diet - low sodium heart healthy    Complete by:  As directed    Increase activity slowly    Complete by:  As directed    Lifting restrictions    Complete by:  As directed    No heavy lifting, pushing or pulling greater than 15 pounds.   Sexual Activity Restrictions    Complete by:  As directed    Pelvic rest x 6 weeks     Allergies as of 02/08/2017   No Known Allergies     Medication List    TAKE these medications   ALLEGRA PO Take 1  tablet by mouth daily as needed (ALLERGIES).   b complex vitamins tablet Take 1 tablet by mouth daily.   ibuprofen 800 MG tablet Commonly known as:  ADVIL,MOTRIN Take 1 tablet (800 mg total) by mouth every 8 (eight) hours as needed (mild pain).            Discharge Care Instructions        Start     Ordered   02/08/17 0000  Increase activity slowly     02/08/17 1903   02/08/17 0000  Diet - low sodium heart healthy     02/08/17 1903   02/08/17 0000  Lifting restrictions    Comments:  No heavy lifting, pushing or pulling greater than 15 pounds.   02/08/17 1903   02/08/17 0000  Sexual Activity Restrictions    Comments:  Pelvic rest x 6 weeks   02/08/17 1903   02/08/17 0000  Call MD for:  temperature >100.4     02/08/17 1903   02/08/17 0000  Call MD for:  persistant nausea and vomiting     02/08/17 1903   02/08/17 0000  Call MD for:  severe uncontrolled pain     02/08/17 1903   02/08/17 0000  Call MD for:  redness, tenderness, or signs of infection (pain, swelling, redness, odor or  green/yellow discharge around incision site)     02/08/17 1903   02/08/17 0000  Call MD for:  persistant dizziness or light-headedness     02/08/17 1903   02/08/17 0000  Call MD for:  extreme fatigue     02/08/17 1903     Follow-up Information    [name], MD Follow up in 1 day(s).   Specialty:  Obstetrics and Gynecology Contact information: [address] [address] [phone]           Signed: Catha Brow. 03/04/2017, 6:07 PM

## 2017-11-23 ENCOUNTER — Other Ambulatory Visit: Payer: Self-pay | Admitting: Family Medicine

## 2017-11-23 DIAGNOSIS — N6310 Unspecified lump in the right breast, unspecified quadrant: Secondary | ICD-10-CM

## 2017-12-20 ENCOUNTER — Other Ambulatory Visit: Payer: Self-pay | Admitting: Family Medicine

## 2017-12-20 ENCOUNTER — Ambulatory Visit
Admission: RE | Admit: 2017-12-20 | Discharge: 2017-12-20 | Disposition: A | Discharge status: Home / Self Care | Payer: Managed Care, Other (non HMO) | Source: Ambulatory Visit | Attending: Family Medicine | Admitting: Family Medicine

## 2017-12-20 DIAGNOSIS — N6310 Unspecified lump in the right breast, unspecified quadrant: Secondary | ICD-10-CM

## 2017-12-20 IMAGING — MG DIGITAL DIAGNOSTIC BILATERAL MAMMOGRAM WITH TOMO AND CAD
6 of 10 series · 6 of 30 positions shown · non-contrast
Comparison: Previous exam(s).

CLINICAL DATA: Right supraclavicular area of palpable concern and
tenderness felt by the patient.

EXAM:
DIGITAL DIAGNOSTIC BILATERAL MAMMOGRAM WITH CAD AND TOMO
ULTRASOUND RIGHT BREAST

[L MLO synth-2D]
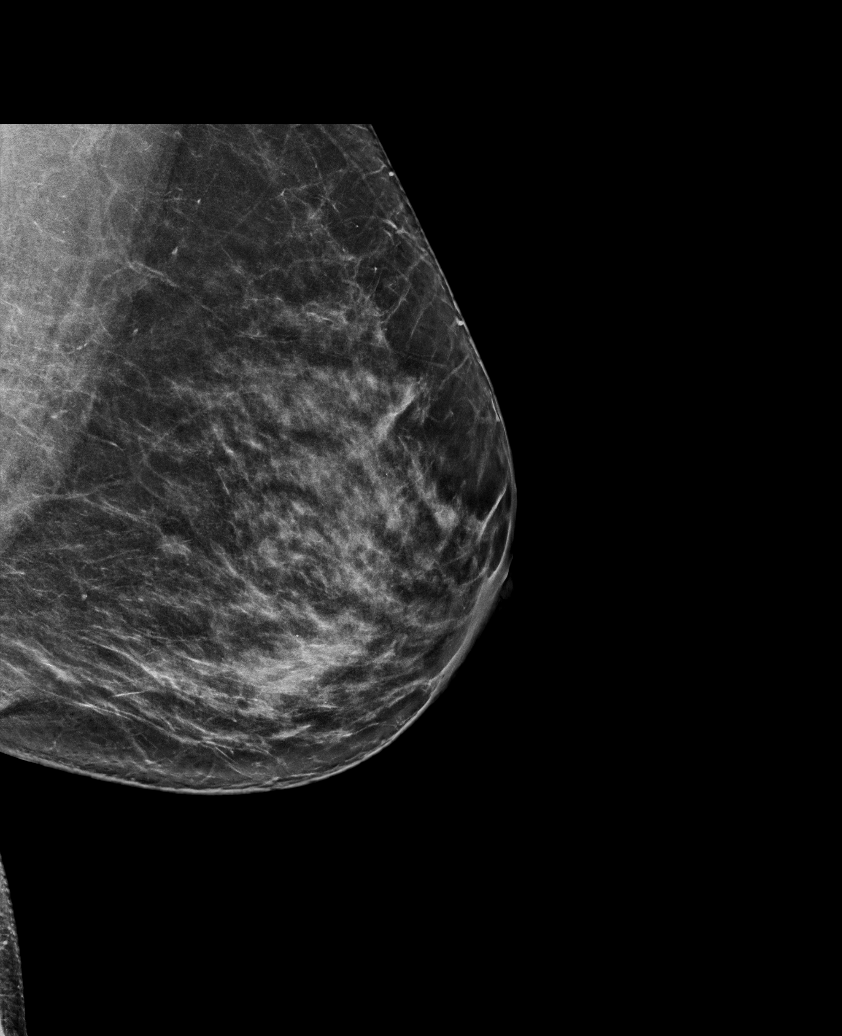

[L CC synth-2D]
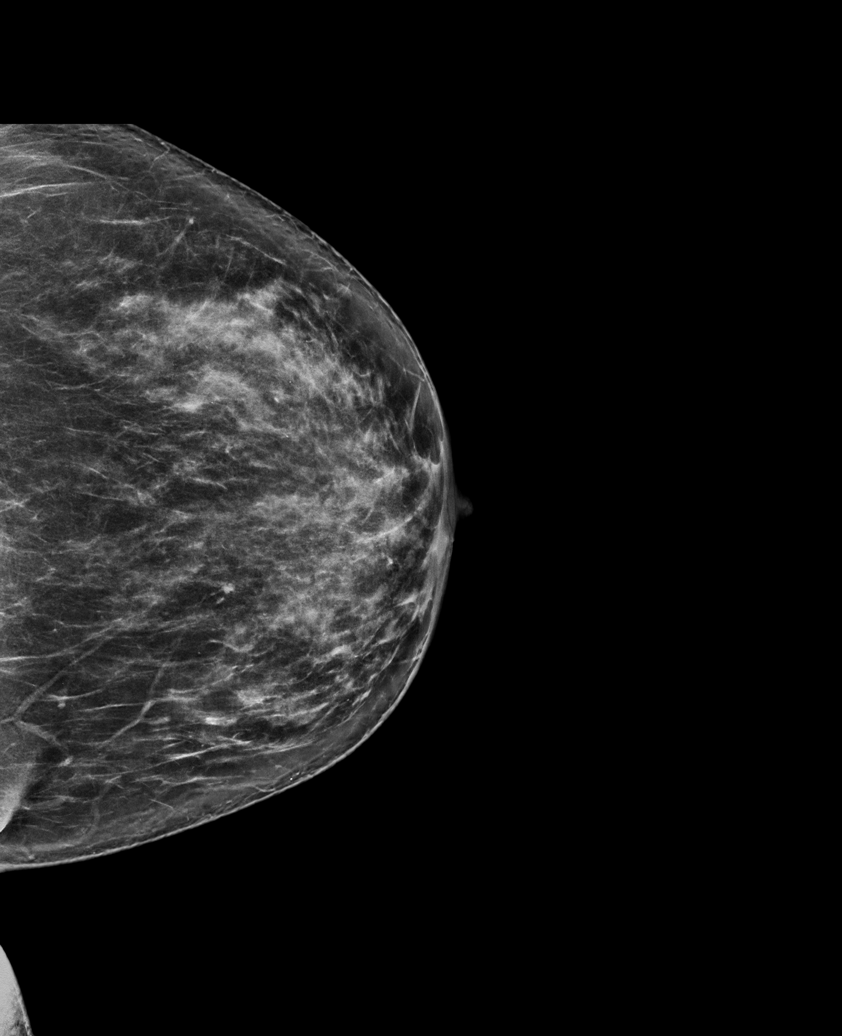

[R CC synth-2D]
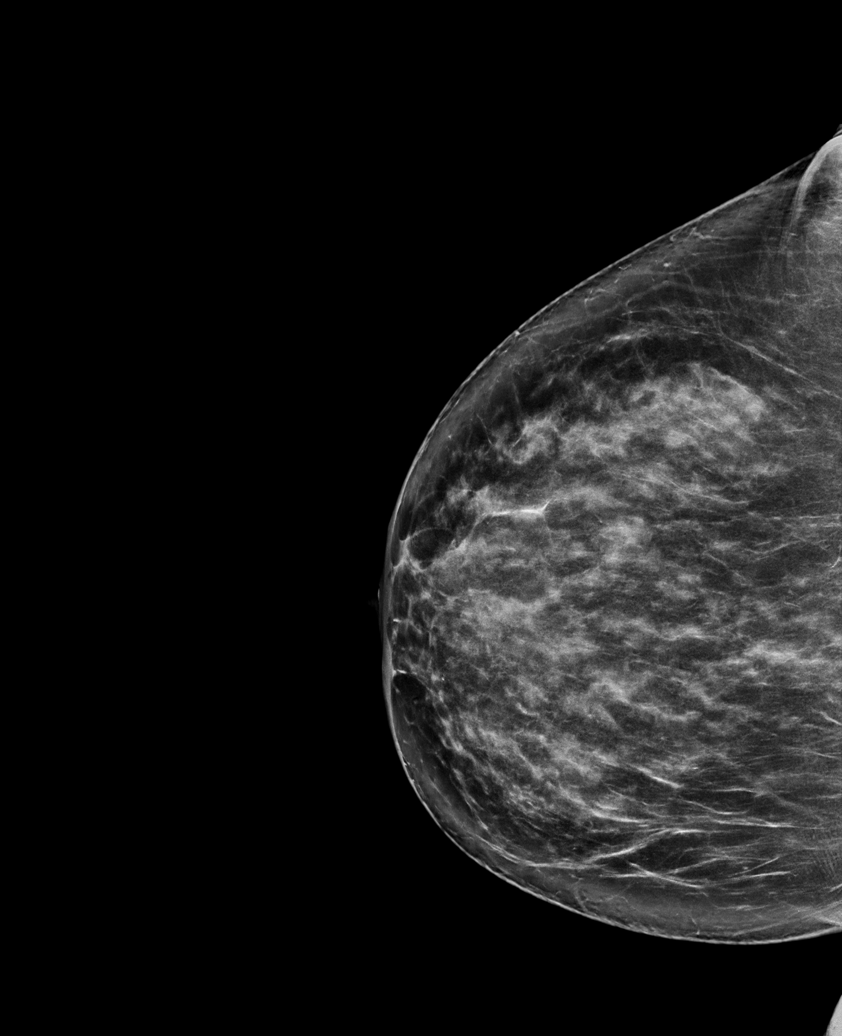

[R MLO synth-2D (1 of 2)]
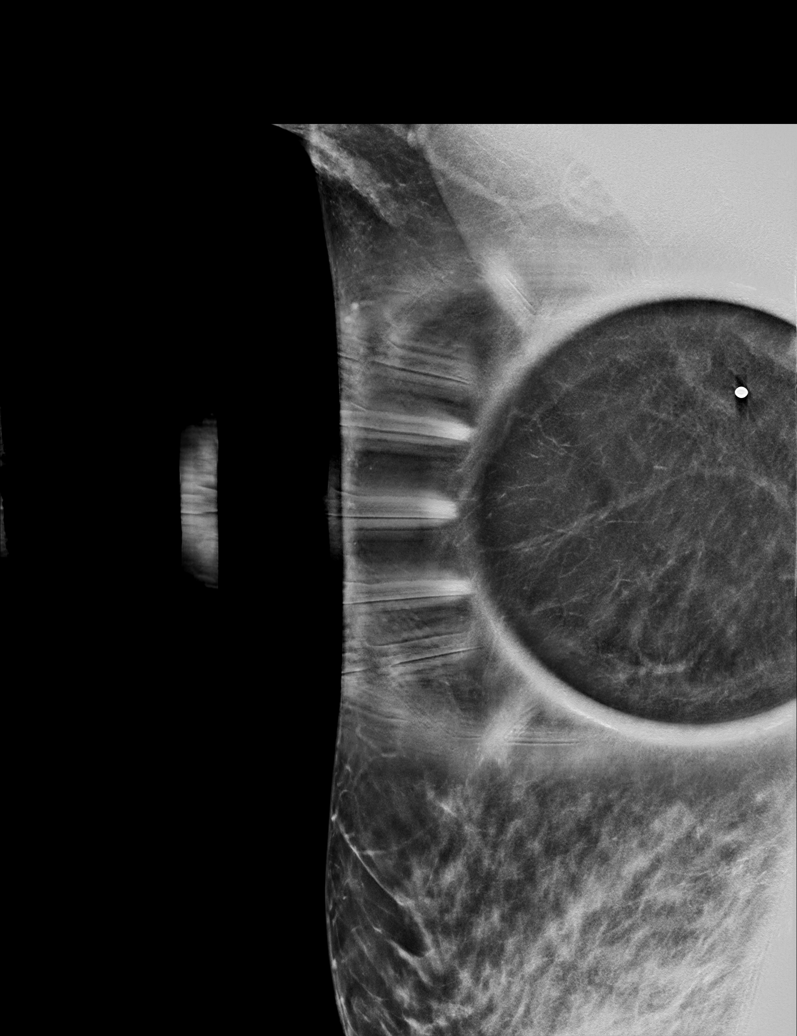

[R MLO synth-2D (2 of 2)]
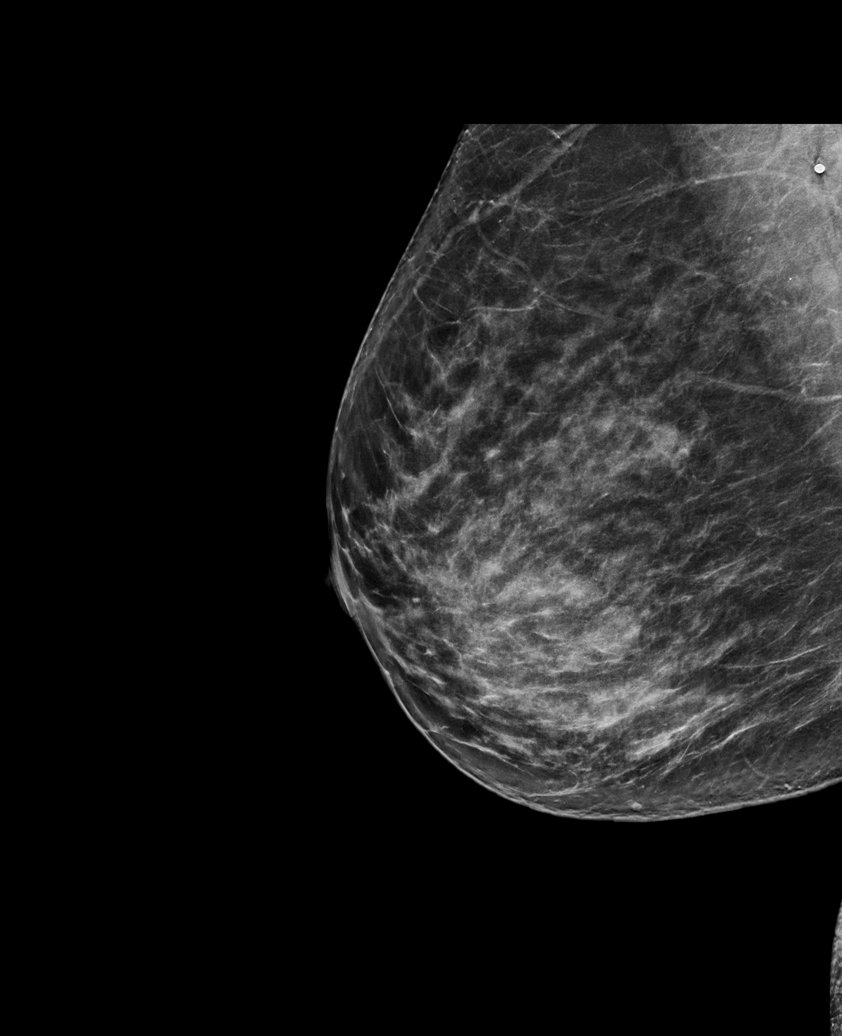

[R MLO tomo · tomo slice 42/83.0]
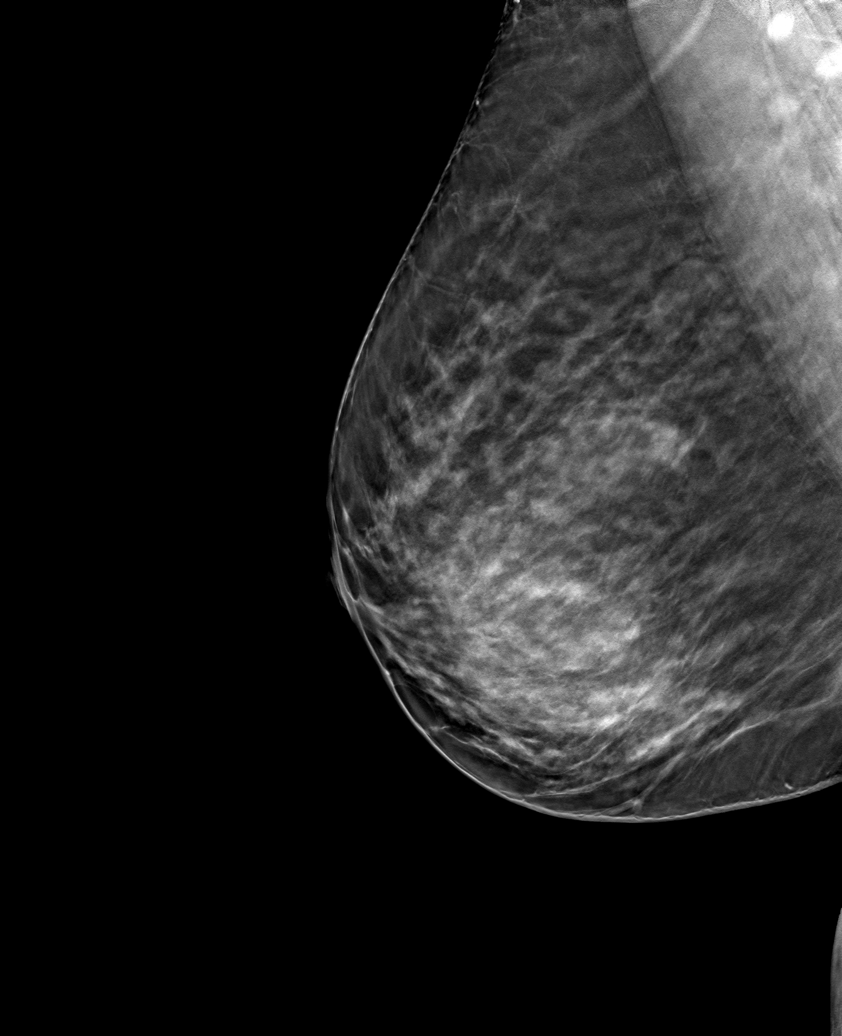

[6 of 30 positions shown; findings below may reference images not displayed]

ACR Breast Density Category c: The breast tissue is heterogeneously
dense, which may obscure small masses.
FINDINGS: Mammographically, there are no suspicious masses, areas of
architectural distortion or microcalcifications in either breast.
The mass of concern in the right breast upper inner quadrant could
not be imaged, due to its far posterior location. There is a stable
circumscribed mass in the right breast lower outer quadrant, middle
depth.

Mammographic images were processed with CAD.

On physical exam, there is a moderately firm palpable approximately
1.5 cm mass in the right breast upper inner quadrant, far posterior
depth.

Targeted ultrasound is performed, showing right breast 1 o'clock 12
cm from the nipple hypoechoic taller than wide irregular solid mass
which measures 1.4 x 1.3 x 1.3 cm. Additionally, there is a right
breast 8 o'clock 5 cm from the nipple hypoechoic circumscribed mass
measuring 1.2 x 1.0 x 1.4 cm. This likely represents a mildly
complicated cyst.

Ultrasound evaluation of the right axilla demonstrates no evidence
of lymphadenopathy.
IMPRESSION: Suspicious palpable right breast 1 o'clock mass, not seen
mammographically due to its far posterior location.

Probably benign right breast 8 o'clock cyst.

No evidence of right axillary lymphadenopathy.

RECOMMENDATION:
Ultrasound-guided core needle biopsy of right breast 1 o'clock mass.

Cyst aspiration of probably benign right breast 8 o'clock cyst.

Contrast-enhanced breast MRI may be needed, if pathology results are
malignant, because of patient's age being less than 50 and the
presence of dense breast parenchyma.

I have discussed the findings and recommendations with the patient.
Results were also provided in writing at the conclusion of the
visit. If applicable, a reminder letter will be sent to the patient
regarding the next appointment.

BI-RADS CATEGORY  4: Suspicious.

## 2017-12-29 ENCOUNTER — Other Ambulatory Visit: Payer: Self-pay | Admitting: Family Medicine

## 2017-12-29 ENCOUNTER — Ambulatory Visit
Admission: RE | Admit: 2017-12-29 | Discharge: 2017-12-29 | Disposition: A | Discharge status: Home / Self Care | Payer: Managed Care, Other (non HMO) | Source: Ambulatory Visit | Attending: Family Medicine | Admitting: Family Medicine

## 2017-12-29 DIAGNOSIS — N6310 Unspecified lump in the right breast, unspecified quadrant: Secondary | ICD-10-CM

## 2017-12-31 ENCOUNTER — Encounter: Payer: Self-pay | Admitting: *Deleted

## 2017-12-31 ENCOUNTER — Telehealth: Payer: Self-pay | Admitting: Oncology

## 2017-12-31 NOTE — Telephone Encounter (Signed)
Spoke to patient to confirm 7/3 morning BC appointment, packet mailed and e-mailed to patient

## 2018-01-05 ENCOUNTER — Other Ambulatory Visit: Payer: Self-pay | Admitting: *Deleted

## 2018-01-05 DIAGNOSIS — C50211 Malignant neoplasm of upper-inner quadrant of right female breast: Secondary | ICD-10-CM | POA: Insufficient documentation

## 2018-01-05 DIAGNOSIS — Z170 Estrogen receptor positive status [ER+]: Secondary | ICD-10-CM | POA: Insufficient documentation

## 2018-01-11 NOTE — Progress Notes (Signed)
[facility]  [phone] [phone]     ID: [name] DOB: [birth year]  MR#: [id number]  FVO#:[id number]  Patient Care Team: [name], PA-C as PCP - General (Family Medicine) [name], MD as Consulting Physician (General Surgery) Magrinat, [name], MD as Consulting Physician (Oncology) [name], MD as Consulting Physician (Radiation Oncology) [name], MD as Consulting Physician (Obstetrics and Gynecology) OTHER MD: [facility]  CHIEF COMPLAINT: triple positive breast cancer  CURRENT TREATMENT: awaiting definitive surgery   HISTORY OF CURRENT ILLNESS: Monica [name] (pronounced "uric") palpated a right supraclavicular mass and felt tenderness after stretching one day. She brought this to her PA's attention.  The patient underwent bilateral diagnostic mammography with tomography and right breast ultrasonography at [facility] on 12/20/2017 showing: breast density category C. There was a suspicious palpable mass at the 1 o'clock position upper inner quadrant measuring 1.4 x 1.3 x 1.3 cm located 12 cm from the nipple. There was an additional mass at the 8 o'clock radiant measuring 1.2 x 1.0 x 1.4 cm, which likely represented a cyst. Ultrasound evaluation of the right axilla demonstrates no evidence of lymphadenopathy  Accordingly on 12/29/2017 she proceeded to biopsy of the right breast area in question. The pathology from this procedure showed ([id number]): Invasive ductal carcinoma, grade III. Prognostic indicators significant for: estrogen receptor, 80% positive and progesterone receptor, 70% positive, both with strong staining intensity. Proliferation marker Ki67 at 80%. HER2 amplified with ratios HER2/CEP17 signals 3.44 and average HER2 copies per cell 10.65.  The patient's subsequent history is as detailed below.  INTERVAL HISTORY: Monica [name] was evaluated in the multidisciplinary breast cancer clinic on  01/12/2018 accompanied by her son and daughter. The patient's case was also presented at the multidisciplinary breast cancer conference on the same day. At that time a preliminary plan was proposed: Breast conserving surgery, then chemotherapy with anti-HER-2 immunotherapy, adjuvant radiation, and antiestrogens.  She will need genetics as well.   REVIEW OF SYSTEMS: Monica [name] reports that for exercise, she walks her dog everyday along with weight lifting. She has some itching in her armpits especially after taking a shower. She takes a benedryl which improves the itching. The patient denies unusual headaches, visual changes, nausea, vomiting, stiff neck, dizziness, or gait imbalance. There has been no cough, phlegm production, or pleurisy, no chest pain or pressure, and no change in bowel or bladder habits. The patient denies fever, rash, bleeding, unexplained fatigue or unexplained weight loss. A detailed review of systems was otherwise entirely negative.   PAST MEDICAL HISTORY: Past Medical History:  Diagnosis Date   Arthritis    lower back   Sleep apnea    borderline, no cpap ordered, encouraged to loss weight  She notes that she had a heart murmur as a child, which she grew out of.   PAST SURGICAL HISTORY: Past Surgical History:  Procedure Laterality Date   ABDOMINAL HYSTERECTOMY     HYMENECTOMY     REPAIR VAGINAL CUFF N/A 02/07/2017   Procedure: REPAIR VAGINAL CUFF;  Surgeon: Ena Dawley, MD;  Location: [facility];  Service: Gynecology;  Laterality: N/A;   ROBOTIC ASSISTED TOTAL HYSTERECTOMY WITH SALPINGECTOMY Left 01/20/2017   Procedure: ROBOTIC ASSISTED TOTAL HYSTERECTOMY WITH SALPINGECTOMY;  Surgeon: [name], MD;  Location: [facility];  Service: Gynecology;  Laterality: Left;  Partial Hysterectomy without BSO  FAMILY HISTORY Family History  Problem Relation Age of Onset   Lung cancer Maternal Grandfather    Esophageal cancer Paternal Grandfather  Breast cancer Cousin     The patient's father is alive at 44. The patient's mother is also alive at 82. The patient has 1 brother and 5 sisters. There was a maternal grandfather diagnosed with lung cancer at 32. There was a paternal grandfather diagnose with esophageal cancer at 39. The patient's father had a pre-cancerous esophageal finding at age 64. There was also a maternal 1st cousin diagnosed with metastatic breast cancer at age 39.    GYNECOLOGIC HISTORY:  Patient's last menstrual period was 12/21/2016 (exact date). Menarche: 47 years old Age at first live birth: 47 years old She is GXP2. She is status post partial hysterectomy without BSO. Her LMP was June 2018.  She never used HRT. She used oral contraception over 21 years ago with no complications.   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 The patient is separated from her husband, Monica [name]. The patient's son Monica [name] age 2 is in the [employer] stationed in [place] in the Horticulturist, commercial. He plans on working in the railroad industry after he returns. The patient's daughter Monica [name] age 75, works at [employer].      ADVANCED DIRECTIVES: Not in place   HEALTH MAINTENANCE: Social History   Tobacco Use   Smoking status: Never Smoker   Smokeless tobacco: Never Used  Substance Use Topics   Alcohol use: Yes    Comment: occ   Drug use: No     Colonoscopy:   PAP: 2018/ prior to hysterectomy  Bone density:   No Known Allergies  Current Outpatient Medications  Medication Sig Dispense Refill   b complex vitamins tablet Take 1 tablet by mouth daily.     Fexofenadine HCl (ALLEGRA PO) Take 1 tablet by mouth daily as needed (ALLERGIES).     ibuprofen (ADVIL,MOTRIN) 800 MG tablet Take 1 tablet (800 mg total) by mouth every 8 (eight) hours as needed (mild pain). (Patient not taking: Reported on 01/12/2018) 30 tablet 1   No current facility-administered medications for this visit.     OBJECTIVE:  Middle-aged white woman in no acute distress  Vitals:   01/12/18 0923  BP: (!) 133/92  Pulse: 62  Resp: 18  Temp: 98.4 F (36.9 C)  SpO2: 100%     Body mass index is 27.08 kg/m.   Wt Readings from Last 3 Encounters:  01/12/18 178 lb 1.6 oz (80.8 kg)  02/07/17 192 lb (87.1 kg)  01/07/17 195 lb 2 oz (88.5 kg)      ECOG FS:0 - Asymptomatic  Ocular: Sclerae unicteric, pupils round and equal Ear-nose-throat: Oropharynx clear and moist Lymphatic: No cervical or supraclavicular adenopathy Lungs no rales or rhonchi Heart regular rate and rhythm Abd soft, nontender, positive bowel sounds MSK no focal spinal tenderness, no joint edema Neuro: non-focal, well-oriented, appropriate affect Breasts: The right breast is status post recent biopsy.  With difficulty I palpate a mass in the upper inner quadrant, measuring perhaps 1 cm.  There is no erythema.  Left breast is benign.  Both axillae are benign.   LAB RESULTS:  CMP     Component Value Date/Time   NA 140 01/12/2018 0902   K 3.8 01/12/2018 0902   CL 104 01/12/2018 0902   CO2 30 01/12/2018 0902   GLUCOSE 92 01/12/2018 0902   BUN 7 01/12/2018 0902   CREATININE 0.78 01/12/2018 0902   CALCIUM 9.7 01/12/2018 0902   PROT 6.8 01/12/2018 0902   ALBUMIN 4.1 01/12/2018 0902  AST 11 (L) 01/12/2018 0902   ALT 9 01/12/2018 0902   ALKPHOS 45 01/12/2018 0902   BILITOT 0.4 01/12/2018 0902   GFRNONAA >60 01/12/2018 0902   GFRAA >60 01/12/2018 0902    No results found for: TOTALPROTELP, ALBUMINELP, A1GS, A2GS, BETS, BETA2SER, GAMS, MSPIKE, SPEI  No results found for: KPAFRELGTCHN, LAMBDASER, KAPLAMBRATIO  Lab Results  Component Value Date   WBC 5.9 01/12/2018   NEUTROABS 3.8 01/12/2018   HGB 13.4 01/12/2018   HCT 41.1 01/12/2018   MCV 92.6 01/12/2018   PLT 277 01/12/2018    '@LASTCHEMISTRY' @  No results found for: LABCA2  No components found for: WUXLKG401  No results for input(s): INR in the last 168 hours.  No results  found for: LABCA2  No results found for: UUV253  No results found for: GUY403  No results found for: KVQ259  No results found for: CA2729  No components found for: HGQUANT  No results found for: CEA1 / No results found for: CEA1   No results found for: AFPTUMOR  No results found for: CHROMOGRNA  No results found for: PSA1  Appointment on 01/12/2018  Component Date Value Ref Range Status   Sodium 01/12/2018 140  135 - 145 mmol/L Final   Please note reference intervals were recently updated.   Potassium 01/12/2018 3.8  3.5 - 5.1 mmol/L Final   Chloride 01/12/2018 104  98 - 111 mmol/L Final   CO2 01/12/2018 30  22 - 32 mmol/L Final   Glucose, Bld 01/12/2018 92  70 - 99 mg/dL Final   BUN 01/12/2018 7  6 - 20 mg/dL Final   Please note change in reference range.   Creatinine 01/12/2018 0.78  0.44 - 1.00 mg/dL Final   Calcium 01/12/2018 9.7  8.9 - 10.3 mg/dL Final   Total Protein 01/12/2018 6.8  6.5 - 8.1 g/dL Final   Albumin 01/12/2018 4.1  3.5 - 5.0 g/dL Final   AST 01/12/2018 11* 15 - 41 U/L Final   ALT 01/12/2018 9  0 - 44 U/L Final   Alkaline Phosphatase 01/12/2018 45  38 - 126 U/L Final   Total Bilirubin 01/12/2018 0.4  0.3 - 1.2 mg/dL Final   GFR, Est Non Af Am 01/12/2018 >60  >60 mL/min Final   GFR, Est AFR Am 01/12/2018 >60  >60 mL/min Final   Comment: (NOTE) The eGFR has been calculated using the CKD EPI equation. This calculation has not been validated in all clinical situations. eGFR's persistently <60 mL/min signify possible Chronic Kidney Disease.    Anion gap 01/12/2018 6  5 - 15 Final   Performed at [facility], [place] [address] [place], [place] [address]   WBC Count 01/12/2018 5.9  3.9 - 10.3 K/uL Final   RBC 01/12/2018 4.44  3.70 - 5.45 MIL/uL Final   Hemoglobin 01/12/2018 13.4  11.6 - 15.9 g/dL Final   HCT 01/12/2018 41.1  34.8 - 46.6 % Final   MCV 01/12/2018 92.6  79.5 - 101.0 fL Final   MCH 01/12/2018  30.2  25.1 - 34.0 pg Final   MCHC 01/12/2018 32.6  31.5 - 36.0 g/dL Final   RDW 01/12/2018 12.9  11.2 - 14.5 % Final   Platelet Count 01/12/2018 277  145 - 400 K/uL Final   Neutrophils Relative % 01/12/2018 64  % Final   Neutro Abs 01/12/2018 3.8  1.5 - 6.5 K/uL Final   Lymphocytes Relative 01/12/2018 24  % Final   Lymphs Abs 01/12/2018  1.4  0.9 - 3.3 K/uL Final   Monocytes Relative 01/12/2018 9  % Final   Monocytes Absolute 01/12/2018 0.5  0.1 - 0.9 K/uL Final   Eosinophils Relative 01/12/2018 2  % Final   Eosinophils Absolute 01/12/2018 0.1  0.0 - 0.5 K/uL Final   Basophils Relative 01/12/2018 1  % Final   Basophils Absolute 01/12/2018 0.1  0.0 - 0.1 K/uL Final   Performed at [facility], [place] [address], [place], [place] [address]    (this displays the last labs from the last 3 days)  No results found for: TOTALPROTELP, ALBUMINELP, A1GS, A2GS, BETS, BETA2SER, GAMS, MSPIKE, SPEI (this displays SPEP labs)  No results found for: KPAFRELGTCHN, LAMBDASER, KAPLAMBRATIO (kappa/lambda light chains)  No results found for: HGBA, HGBA2QUANT, HGBFQUANT, HGBSQUAN (Hemoglobinopathy evaluation)   No results found for: LDH  No results found for: IRON, TIBC, IRONPCTSAT (Iron and TIBC)  No results found for: FERRITIN  Urinalysis No results found for: COLORURINE, APPEARANCEUR, LABSPEC, PHURINE, GLUCOSEU, HGBUR, BILIRUBINUR, KETONESUR, PROTEINUR, UROBILINOGEN, NITRITE, LEUKOCYTESUR   STUDIES: US Breast Ltd Uni Right Inc Axilla  Result Date: 12/20/2017 CLINICAL DATA:  Right supraclavicular area of palpable concern and tenderness felt by the patient. EXAM: DIGITAL DIAGNOSTIC BILATERAL MAMMOGRAM WITH CAD AND TOMO ULTRASOUND RIGHT BREAST COMPARISON:  Previous exam(s). ACR Breast Density Category c: The breast tissue is heterogeneously dense, which may obscure small masses. FINDINGS: Mammographically, there are no suspicious masses, areas of architectural  distortion or microcalcifications in either breast. The mass of concern in the right breast upper inner quadrant could not be imaged, due to its far posterior location. There is a stable circumscribed mass in the right breast lower outer quadrant, middle depth. Mammographic images were processed with CAD. On physical exam, there is a moderately firm palpable approximately 1.5 cm mass in the right breast upper inner quadrant, far posterior depth. Targeted ultrasound is performed, showing right breast 1 o'clock 12 cm from the nipple hypoechoic taller than wide irregular solid mass which measures 1.4 x 1.3 x 1.3 cm. Additionally, there is a right breast 8 o'clock 5 cm from the nipple hypoechoic circumscribed mass measuring 1.2 x 1.0 x 1.4 cm. This likely represents a mildly complicated cyst. Ultrasound evaluation of the right axilla demonstrates no evidence of lymphadenopathy. IMPRESSION: Suspicious palpable right breast 1 o'clock mass, not seen mammographically due to its far posterior location. Probably benign right breast 8 o'clock cyst. No evidence of right axillary lymphadenopathy. RECOMMENDATION: Ultrasound-guided core needle biopsy of right breast 1 o'clock mass. Cyst aspiration of probably benign right breast 8 o'clock cyst. Contrast-enhanced breast MRI may be needed, if pathology results are malignant, because of patient's age being less than 11 and the presence of dense breast parenchyma. I have discussed the findings and recommendations with the patient. Results were also provided in writing at the conclusion of the visit. If applicable, a reminder letter will be sent to the patient regarding the next appointment. BI-RADS CATEGORY  4: Suspicious. Electronically Signed   By: Fidela Salisbury M.D.   On: 12/20/2017 10:20   Mm Diag Breast Tomo Bilateral  Result Date: 12/20/2017 CLINICAL DATA:  Right supraclavicular area of palpable concern and tenderness felt by the patient. EXAM: DIGITAL DIAGNOSTIC  BILATERAL MAMMOGRAM WITH CAD AND TOMO ULTRASOUND RIGHT BREAST COMPARISON:  Previous exam(s). ACR Breast Density Category c: The breast tissue is heterogeneously dense, which may obscure small masses. FINDINGS: Mammographically, there are no suspicious masses, areas of architectural distortion or microcalcifications in  either breast. The mass of concern in the right breast upper inner quadrant could not be imaged, due to its far posterior location. There is a stable circumscribed mass in the right breast lower outer quadrant, middle depth. Mammographic images were processed with CAD. On physical exam, there is a moderately firm palpable approximately 1.5 cm mass in the right breast upper inner quadrant, far posterior depth. Targeted ultrasound is performed, showing right breast 1 o'clock 12 cm from the nipple hypoechoic taller than wide irregular solid mass which measures 1.4 x 1.3 x 1.3 cm. Additionally, there is a right breast 8 o'clock 5 cm from the nipple hypoechoic circumscribed mass measuring 1.2 x 1.0 x 1.4 cm. This likely represents a mildly complicated cyst. Ultrasound evaluation of the right axilla demonstrates no evidence of lymphadenopathy. IMPRESSION: Suspicious palpable right breast 1 o'clock mass, not seen mammographically due to its far posterior location. Probably benign right breast 8 o'clock cyst. No evidence of right axillary lymphadenopathy. RECOMMENDATION: Ultrasound-guided core needle biopsy of right breast 1 o'clock mass. Cyst aspiration of probably benign right breast 8 o'clock cyst. Contrast-enhanced breast MRI may be needed, if pathology results are malignant, because of patient's age being less than 51 and the presence of dense breast parenchyma. I have discussed the findings and recommendations with the patient. Results were also provided in writing at the conclusion of the visit. If applicable, a reminder letter will be sent to the patient regarding the next appointment. BI-RADS CATEGORY   4: Suspicious. Electronically Signed   By: Fidela Salisbury M.D.   On: 12/20/2017 10:20   Mm Clip Placement Right  Result Date: 12/29/2017 CLINICAL DATA:  Post biopsy mammogram of the right breast for clip placement. EXAM: DIAGNOSTIC RIGHT MAMMOGRAM POST ULTRASOUND BIOPSY COMPARISON:  Previous exam(s). FINDINGS: Mammographic images were obtained following ultrasound guided biopsy of right breast mass at 1 o'clock. Due to the far superior and posterior positioning of this mass, the ribbon shaped biopsy marking clip can only be seen on the MLO view, however the tomosynthesis images confirm that it lies within the mass. IMPRESSION: Appropriate positioning of the ribbon shaped biopsy marking clip in the upper inner right breast. Final Assessment: Post Procedure Mammograms for Marker Placement Electronically Signed   By: Ammie Ferrier M.D.   On: 12/29/2017 08:48   [place] Rt Breast Bx W Loc Dev 1st Lesion Img Bx Spec US Guide  Addendum Date: 12/31/2017   ADDENDUM REPORT: 12/31/2017 08:06 ADDENDUM: Pathology revealed GRADE III - INVASIVE DUCTAL CARCINOMA of RIGHT breast, 1 o'clock. This was found to be concordant by Dr. Ammie Ferrier. Pathology results were discussed with the patient by telephone. The patient reported doing well after the biopsy with tenderness at the site. Post biopsy instructions and care were reviewed and questions were answered. The patient was encouraged to call [facility] for any additional concerns. The patient was referred to [facility] at [facility] on January 12, 2018, per patient request, as she is out of town. Surgical considerations: the mass will likely not be amenable to mammogram guided seed localization, but can be performed with ultrasound guidance. However, if neoadjuvant chemotherapy is planned, we should place a hydromark clip in the mass prior to starting therapy because the clip and  mass may not be well seen following treatment. Contrast-enhanced breast MRI may be needed due to patient's age being less than 42 and the presence of dense breast parenchyma per previous  diagnostic report. Pathology results reported by Roselind Messier, RN on 12/31/2017. Electronically Signed   By: Ammie Ferrier M.D.   On: 12/31/2017 08:06   Result Date: 12/31/2017 CLINICAL DATA:  47 year old female presenting for ultrasound-guided biopsy of a right breast mass and aspiration of a right breast cyst. EXAM: ULTRASOUND GUIDED RIGHT BREAST CORE NEEDLE BIOPSY COMPARISON:  Previous exam(s). FINDINGS: I met with the patient and we discussed the procedure of ultrasound-guided biopsy, including benefits and alternatives. We discussed the high likelihood of a successful procedure. We discussed the risks of the procedure, including infection, bleeding, tissue injury, clip migration, and inadequate sampling. Informed written consent was given. The usual time-out protocol was performed immediately prior to the procedure. Lesion quadrant: Upper-inner quadrant Using sterile technique and 1% Lidocaine as local anesthetic, under direct ultrasound visualization, a 14 gauge spring-loaded device was used to perform biopsy of a mass in the far superior upper inner quadrant of the right breast using an inferior approach. At the conclusion of the procedure a ribbon shaped tissue marker clip was deployed into the biopsy cavity. Follow up 2 view mammogram was performed and dictated separately. Next, using sterile technique and 1% lidocaine as local anesthetic, under direct ultrasound visualization a cyst at 8 o'clock was aspirated using an inferior approach. The cyst completely collapsed with aspiration, and the fluid was discarded. IMPRESSION: 1. Ultrasound guided biopsy of a right breast mass at 1 o'clock. No apparent complications. 2. Ultrasound-guided aspiration of a right breast cyst at 8 o'clock. No apparent complications.  Electronically Signed: By: Ammie Ferrier M.D. On: 12/29/2017 08:27   US Breast Aspiration Right  Addendum Date: 12/31/2017   ADDENDUM REPORT: 12/31/2017 08:06 ADDENDUM: Pathology revealed GRADE III - INVASIVE DUCTAL CARCINOMA of RIGHT breast, 1 o'clock. This was found to be concordant by Dr. Ammie Ferrier. Pathology results were discussed with the patient by telephone. The patient reported doing well after the biopsy with tenderness at the site. Post biopsy instructions and care were reviewed and questions were answered. The patient was encouraged to call [facility] for any additional concerns. The patient was referred to [facility] at [facility] on January 12, 2018, per patient request, as she is out of town. Surgical considerations: the mass will likely not be amenable to mammogram guided seed localization, but can be performed with ultrasound guidance. However, if neoadjuvant chemotherapy is planned, we should place a hydromark clip in the mass prior to starting therapy because the clip and mass may not be well seen following treatment. Contrast-enhanced breast MRI may be needed due to patient's age being less than 22 and the presence of dense breast parenchyma per previous diagnostic report. Pathology results reported by Roselind Messier, RN on 12/31/2017. Electronically Signed   By: Ammie Ferrier M.D.   On: 12/31/2017 08:06   Result Date: 12/31/2017 CLINICAL DATA:  47 year old female presenting for ultrasound-guided biopsy of a right breast mass and aspiration of a right breast cyst. EXAM: ULTRASOUND GUIDED RIGHT BREAST CORE NEEDLE BIOPSY COMPARISON:  Previous exam(s). FINDINGS: I met with the patient and we discussed the procedure of ultrasound-guided biopsy, including benefits and alternatives. We discussed the high likelihood of a successful procedure. We discussed the risks of the procedure, including  infection, bleeding, tissue injury, clip migration, and inadequate sampling. Informed written consent was given. The usual time-out protocol was performed immediately prior to the procedure. Lesion quadrant: Upper-inner quadrant Using sterile technique and 1%  Lidocaine as local anesthetic, under direct ultrasound visualization, a 14 gauge spring-loaded device was used to perform biopsy of a mass in the far superior upper inner quadrant of the right breast using an inferior approach. At the conclusion of the procedure a ribbon shaped tissue marker clip was deployed into the biopsy cavity. Follow up 2 view mammogram was performed and dictated separately. Next, using sterile technique and 1% lidocaine as local anesthetic, under direct ultrasound visualization a cyst at 8 o'clock was aspirated using an inferior approach. The cyst completely collapsed with aspiration, and the fluid was discarded. IMPRESSION: 1. Ultrasound guided biopsy of a right breast mass at 1 o'clock. No apparent complications. 2. Ultrasound-guided aspiration of a right breast cyst at 8 o'clock. No apparent complications. Electronically Signed: By: Ammie Ferrier M.D. On: 12/29/2017 08:27    ELIGIBLE FOR AVAILABLE RESEARCH PROTOCOL: BCEP?  ASSESSMENT: 47 y.o.  [place], [place] woman status post right breast upper inner quadrant biopsy 12/29/2017, for a clinical T1c N0, stage IA invasive ductal carcinoma, triple positive, with an MIB-1 of 80%.  (1) genetics testing pending  (2) definitive surgery to follow  (3) adjuvant chemotherapy with anti-HER-2 immunotherapy  (4) continue trastuzumab to total 1 year  (5) adjuvant radiation as appropriate  (6) antiestrogen to start at the completion of local treatment  PLAN: We spent the better part of today's hour-long appointment discussing the biology of her diagnosis and the specifics of her situation. We first reviewed the fact that cancer is not one disease but more than 100 different  diseases and that it is important to keep them separate-- otherwise when friends and relatives discuss their own cancer experiences with Monica [name] confusion can result. Similarly we discussed that if breast cancer spreads to the bones, lungs or liver, Monica [name]  would not have bone cancer or liver cancer, but breast cancer in the bone and breast cancer in the liver-- one cancer in three places-- not three different cancers, which otherwise would have to be treated in 3 different ways.  We emphasized the difference between local and systemic therapies. In terms of loco/regional treatment, lumpectomy plus radiation is equivalent to mastectomy as far as survival is concerned.  She is a good candidate for breast conserving surgery with sentinel lymph node sampling and that is the plan.  We then reviewed systemic therapy options. These include anti-estrogens, anti-HER-2 treatment, and chemotherapy. She is a good candidate for all 3. This is very favorable because if we assume her risk of recurrence (after local treatment only) is X, chemotherapy will lower X by one third, anti-HER-2 treatment will cut the remaining 2/3X in half, and anti-estrogen treatment will cut the remaining 1/3X in half again, so her final risk of recurrence will be approximately 1/6 of her baseline risk.  Accordingly as far as systemic therapy is concerned we recommend adjuvant paclitaxel given weekly x12 together with trastuzumab, followed by continuing trastuzumab to complete one year. We discussed the possible toxicities, side effects and complications of these treatments.  This would then be followed by radiation and then antiestrogens.  Monica [name] is being set up for an echocardiogram and a visit with cardio oncology as well as our "chemotherapy school". She will have a port placed at the time of her breast surgery. She will meet with me again to discuss anti-emetics and other supportive measures.  At the next visit we will give her the  appropriate documents to complete and notarized with regards to healthcare power of attorney determination.  Monica [name]  has a good understanding of the overall plan. She agrees with it. She knows the goal of treatment in her case is cure. She will call with any problems that may develop before her next visit here.  Shawnie Dapper   01/12/2018 12:45 PM Medical Oncology and Hematology [facility] [address] [place], [place] [address] Tel. [phone]    Fax. [phone]

## 2018-01-12 ENCOUNTER — Other Ambulatory Visit: Payer: Self-pay

## 2018-01-12 ENCOUNTER — Ambulatory Visit (HOSPITAL_BASED_OUTPATIENT_CLINIC_OR_DEPARTMENT_OTHER): Payer: Managed Care, Other (non HMO) | Admitting: Licensed Clinical Social Worker

## 2018-01-12 ENCOUNTER — Telehealth: Payer: Self-pay | Admitting: Oncology

## 2018-01-12 ENCOUNTER — Ambulatory Visit
Admission: RE | Admit: 2018-01-12 | Discharge: 2018-01-12 | Disposition: A | Discharge status: Home / Self Care | Payer: Managed Care, Other (non HMO) | Source: Ambulatory Visit | Attending: Radiation Oncology | Admitting: Radiation Oncology

## 2018-01-12 ENCOUNTER — Encounter: Payer: Self-pay | Admitting: Oncology

## 2018-01-12 ENCOUNTER — Encounter: Payer: Self-pay | Admitting: Licensed Clinical Social Worker

## 2018-01-12 ENCOUNTER — Ambulatory Visit: Payer: Managed Care, Other (non HMO) | Attending: Surgery | Admitting: Physical Therapy

## 2018-01-12 ENCOUNTER — Inpatient Hospital Stay: Payer: Managed Care, Other (non HMO) | Attending: Oncology | Admitting: Oncology

## 2018-01-12 ENCOUNTER — Inpatient Hospital Stay: Payer: Managed Care, Other (non HMO)

## 2018-01-12 ENCOUNTER — Ambulatory Visit: Payer: Self-pay | Admitting: Surgery

## 2018-01-12 ENCOUNTER — Encounter: Payer: Self-pay | Admitting: Physical Therapy

## 2018-01-12 VITALS — BP 133/92 | HR 62 | Temp 98.4°F | Resp 18 | Ht 68.0 in | Wt 178.1 lb

## 2018-01-12 DIAGNOSIS — Z801 Family history of malignant neoplasm of trachea, bronchus and lung: Secondary | ICD-10-CM

## 2018-01-12 DIAGNOSIS — Z802 Family history of malignant neoplasm of other respiratory and intrathoracic organs: Secondary | ICD-10-CM

## 2018-01-12 DIAGNOSIS — Z170 Estrogen receptor positive status [ER+]: Secondary | ICD-10-CM

## 2018-01-12 DIAGNOSIS — Z803 Family history of malignant neoplasm of breast: Secondary | ICD-10-CM | POA: Insufficient documentation

## 2018-01-12 DIAGNOSIS — R293 Abnormal posture: Secondary | ICD-10-CM | POA: Diagnosis present

## 2018-01-12 DIAGNOSIS — C50211 Malignant neoplasm of upper-inner quadrant of right female breast: Secondary | ICD-10-CM

## 2018-01-12 DIAGNOSIS — C50911 Malignant neoplasm of unspecified site of right female breast: Secondary | ICD-10-CM

## 2018-01-12 LAB — CMP (CANCER CENTER ONLY)
ALT: 9 U/L (ref 0–44)
AST: 11 U/L — ABNORMAL LOW (ref 15–41)
Albumin: 4.1 g/dL (ref 3.5–5.0)
Alkaline Phosphatase: 45 U/L (ref 38–126)
Anion gap: 6 (ref 5–15)
BUN: 7 mg/dL (ref 6–20)
CO2: 30 mmol/L (ref 22–32)
Calcium: 9.7 mg/dL (ref 8.9–10.3)
Chloride: 104 mmol/L (ref 98–111)
Creatinine: 0.78 mg/dL (ref 0.44–1.00)
GFR, Est AFR Am: >60 mL/min (ref >60)
GFR, Estimated: >60 mL/min (ref >60)
Glucose, Bld: 92 mg/dL (ref 70–99)
Potassium: 3.8 mmol/L (ref 3.5–5.1)
Sodium: 140 mmol/L (ref 135–145)
Total Bilirubin: 0.4 mg/dL (ref 0.3–1.2)
Total Protein: 6.8 g/dL (ref 6.5–8.1)

## 2018-01-12 LAB — CBC WITH DIFFERENTIAL (CANCER CENTER ONLY)
Basophils Absolute: 0.1 10*3/uL (ref 0.0–0.1)
Basophils Relative: 1 %
Eosinophils Absolute: 0.1 10*3/uL (ref 0.0–0.5)
Eosinophils Relative: 2 %
HCT: 41.1 % (ref 34.8–46.6)
Hemoglobin: 13.4 g/dL (ref 11.6–15.9)
Lymphocytes Relative: 24 %
Lymphs Abs: 1.4 10*3/uL (ref 0.9–3.3)
MCH: 30.2 pg (ref 25.1–34.0)
MCHC: 32.6 g/dL (ref 31.5–36.0)
MCV: 92.6 fL (ref 79.5–101.0)
Monocytes Absolute: 0.5 10*3/uL (ref 0.1–0.9)
Monocytes Relative: 9 %
Neutro Abs: 3.8 10*3/uL (ref 1.5–6.5)
Neutrophils Relative %: 64 %
Platelet Count: 277 10*3/uL (ref 145–400)
RBC: 4.44 MIL/uL (ref 3.70–5.45)
RDW: 12.9 % (ref 11.2–14.5)
WBC Count: 5.9 10*3/uL (ref 3.9–10.3)

## 2018-01-12 NOTE — Progress Notes (Addendum)
START ON PATHWAY REGIMEN - Breast   Paclitaxel Weekly + Trastuzumab Weekly:   Administer weekly:     Paclitaxel      Trastuzumab      Trastuzumab   **Always confirm dose/schedule in your pharmacy ordering system**  Trastuzumab (Maintenance - NO Loading Dose):   A cycle is every 21 days:     Trastuzumab   **Always confirm dose/schedule in your pharmacy ordering system**  Patient Characteristics: Postoperative without Neoadjuvant Therapy (Pathologic Staging), Invasive Disease, Adjuvant Therapy, HER2 Positive, ER Positive, Node Negative, pT1c, pN0/N39m Therapeutic Status: Postoperative without Neoadjuvant Therapy (Pathologic Staging) AJCC Grade: G3 AJCC N Category: pN0 AJCC M Category: cM0 ER Status: Positive (+) AJCC 8 Stage Grouping: IA HER2 Status: Positive (+) Oncotype Dx Recurrence Score: Not Appropriate AJCC T Category: pT1c PR Status: Positive (+) Intent of Therapy: Curative Intent, Discussed with Patient

## 2018-01-12 NOTE — Progress Notes (Signed)
Radiation Oncology         [phone] ________________________________  Name: Monica [name]        MRN: [id number]  Date of Service: 01/12/2018 DOB: 07/07/1971  EY:[id number], Loma Sousa, PA-C  [name], MD     REFERRING PHYSICIAN: [name], MD   DIAGNOSIS: The encounter diagnosis was Malignant neoplasm of upper-inner quadrant of right breast in female, estrogen receptor positive ([place]).   HISTORY OF PRESENT ILLNESS: Monica [name] is a 47 y.o. female seen in the multidisciplinary breast clinic for a new diagnosis of right breast cancer. The patient was noted to have a palpable mass along the right upper aspect of the chest. She had a mammogram which did not reveal any visible abnormalities a targeted right breast ultrasound revealed a 1.4 x 1.3 x 1.3 cm mass at 1:00. Her axilla was negative for adenopathy. A biopsy on 12/29/17 revealing a grade 3 invasive ductal carcinoma, ER/PR positive, HER2 amplified with a ratio of 3.44, and a Ki67 of 80%. She comes today to discuss treatent of her cancer.   PREVIOUS RADIATION THERAPY: No   PAST MEDICAL HISTORY:  Past Medical History:  Diagnosis Date  . Arthritis    lower back  . Sleep apnea    borderline, no cpap ordered, encouraged to loss weight       PAST SURGICAL HISTORY: Past Surgical History:  Procedure Laterality Date  . ABDOMINAL HYSTERECTOMY    . HYMENECTOMY    . REPAIR VAGINAL CUFF N/A 02/07/2017   Procedure: REPAIR VAGINAL CUFF;  Surgeon: Ena Dawley, MD;  Location: [facility];  Service: Gynecology;  Laterality: N/A;  . ROBOTIC ASSISTED TOTAL HYSTERECTOMY WITH SALPINGECTOMY Left 01/20/2017   Procedure: ROBOTIC ASSISTED TOTAL HYSTERECTOMY WITH SALPINGECTOMY;  Surgeon: [name], MD;  Location: [facility];  Service: Gynecology;  Laterality: Left;     FAMILY HISTORY: No family history on file.   SOCIAL HISTORY:  reports that she has never smoked. She has never used smokeless tobacco. She reports that she drinks alcohol.  She reports that she does not use drugs. The patient is married and lives in [place]. She has lived in [place] for 5 years. She's accompanied by her children who are 17 and 21. She works as an Chartered loss adjuster.    ALLERGIES: Patient has no known allergies.   MEDICATIONS:  Current Outpatient Medications  Medication Sig Dispense Refill  . b complex vitamins tablet Take 1 tablet by mouth daily.    Marland Kitchen Fexofenadine HCl (ALLEGRA PO) Take 1 tablet by mouth daily as needed (ALLERGIES).    Marland Kitchen ibuprofen (ADVIL,MOTRIN) 800 MG tablet Take 1 tablet (800 mg total) by mouth every 8 (eight) hours as needed (mild pain). 30 tablet 1   No current facility-administered medications for this encounter.      REVIEW OF SYSTEMS: On review of systems, the patient reports that she is doing well overall. She denies any chest pain, shortness of breath, cough, fevers, chills, night sweats, unintended weight changes. She denies any bowel or bladder disturbances, and denies abdominal pain, nausea or vomiting. She denies any new musculoskeletal or joint aches or pains. A complete review of systems is obtained and is otherwise negative.     PHYSICAL EXAM:  Wt Readings from Last 3 Encounters:  02/07/17 192 lb (87.1 kg)  01/07/17 195 lb 2 oz (88.5 kg)   Temp Readings from Last 3 Encounters:  02/08/17 98.2 F (36.8 C) (Oral)  01/21/17 98 F (36.7 C) (Oral)  01/07/17 99.7 F (37.6  C) (Oral)   BP Readings from Last 3 Encounters:  02/08/17 107/61  01/21/17 (!) 105/54  01/07/17 111/76   Pulse Readings from Last 3 Encounters:  02/08/17 62  01/21/17 80  01/07/17 81     In general this is a well appearing Caucasian female in no acute distress. She is alert and oriented x4 and appropriate throughout the examination. HEENT reveals that the patient is normocephalic, atraumatic. EOMs are intact. PERRLA. Skin is intact without any evidence of gross lesions. Cardiovascular exam reveals a regular rate and rhythm, no  clicks rubs or murmurs are auscultated. Chest is clear to auscultation bilaterally. Lymphatic assessment is performed and does not reveal any adenopathy in the cervical, supraclavicular, axillary, or inguinal chains. Bilateral breast exam is performed and reveals fullness about 1-2 cm in the upper chest below her right clavicle. No palpable masses are noted otherwise in the right breast. She has some dense tissue however. The left breast is without palpable mass. No nipple bleeding or discharge is noted of either breast. Abdomen has active bowel sounds in all quadrants and is intact. The abdomen is soft, non tender, non distended. Lower extremities are negative for pretibial pitting edema, deep calf tenderness, cyanosis or clubbing.   ECOG = 1  0 - Asymptomatic (Fully active, able to carry on all predisease activities without restriction)  1 - Symptomatic but completely ambulatory (Restricted in physically strenuous activity but ambulatory and able to carry out work of a light or sedentary nature. For example, light housework, office work)  2 - Symptomatic, <50% in bed during the day (Ambulatory and capable of all self care but unable to carry out any work activities. Up and about more than 50% of waking hours)  3 - Symptomatic, >50% in bed, but not bedbound (Capable of only limited self-care, confined to bed or chair 50% or more of waking hours)  4 - Bedbound (Completely disabled. Cannot carry on any self-care. Totally confined to bed or chair)  5 - Death   Eustace Pen MM, Creech RH, Tormey DC, et al. [phone]). "Toxicity and response criteria of the [facility]". [place] Oncol. 5 (6): 649-55    LABORATORY DATA:  Lab Results  Component Value Date   WBC 13.6 (H) 02/08/2017   HGB 9.6 (L) 02/08/2017   HCT 28.8 (L) 02/08/2017   MCV 86.5 02/08/2017   PLT 287 02/08/2017   Lab Results  Component Value Date   NA 136 02/07/2017   K 3.4 (L) 02/07/2017   CL 103 02/07/2017    CO2 24 02/07/2017   No results found for: ALT, AST, GGT, ALKPHOS, BILITOT    RADIOGRAPHY: US Breast Ltd Uni Right Inc Axilla  Result Date: 12/20/2017 CLINICAL DATA:  Right supraclavicular area of palpable concern and tenderness felt by the patient. EXAM: DIGITAL DIAGNOSTIC BILATERAL MAMMOGRAM WITH CAD AND TOMO ULTRASOUND RIGHT BREAST COMPARISON:  Previous exam(s). ACR Breast Density Category c: The breast tissue is heterogeneously dense, which may obscure small masses. FINDINGS: Mammographically, there are no suspicious masses, areas of architectural distortion or microcalcifications in either breast. The mass of concern in the right breast upper inner quadrant could not be imaged, due to its far posterior location. There is a stable circumscribed mass in the right breast lower outer quadrant, middle depth. Mammographic images were processed with CAD. On physical exam, there is a moderately firm palpable approximately 1.5 cm mass in the right breast upper inner quadrant, far posterior depth. Targeted ultrasound is performed,  showing right breast 1 o'clock 12 cm from the nipple hypoechoic taller than wide irregular solid mass which measures 1.4 x 1.3 x 1.3 cm. Additionally, there is a right breast 8 o'clock 5 cm from the nipple hypoechoic circumscribed mass measuring 1.2 x 1.0 x 1.4 cm. This likely represents a mildly complicated cyst. Ultrasound evaluation of the right axilla demonstrates no evidence of lymphadenopathy. IMPRESSION: Suspicious palpable right breast 1 o'clock mass, not seen mammographically due to its far posterior location. Probably benign right breast 8 o'clock cyst. No evidence of right axillary lymphadenopathy. RECOMMENDATION: Ultrasound-guided core needle biopsy of right breast 1 o'clock mass. Cyst aspiration of probably benign right breast 8 o'clock cyst. Contrast-enhanced breast MRI may be needed, if pathology results are malignant, because of patient's age being less than 33 and the  presence of dense breast parenchyma. I have discussed the findings and recommendations with the patient. Results were also provided in writing at the conclusion of the visit. If applicable, a reminder letter will be sent to the patient regarding the next appointment. BI-RADS CATEGORY  4: Suspicious. Electronically Signed   By: Fidela Salisbury M.D.   On: 12/20/2017 10:20   Mm Diag Breast Tomo Bilateral  Result Date: 12/20/2017 CLINICAL DATA:  Right supraclavicular area of palpable concern and tenderness felt by the patient. EXAM: DIGITAL DIAGNOSTIC BILATERAL MAMMOGRAM WITH CAD AND TOMO ULTRASOUND RIGHT BREAST COMPARISON:  Previous exam(s). ACR Breast Density Category c: The breast tissue is heterogeneously dense, which may obscure small masses. FINDINGS: Mammographically, there are no suspicious masses, areas of architectural distortion or microcalcifications in either breast. The mass of concern in the right breast upper inner quadrant could not be imaged, due to its far posterior location. There is a stable circumscribed mass in the right breast lower outer quadrant, middle depth. Mammographic images were processed with CAD. On physical exam, there is a moderately firm palpable approximately 1.5 cm mass in the right breast upper inner quadrant, far posterior depth. Targeted ultrasound is performed, showing right breast 1 o'clock 12 cm from the nipple hypoechoic taller than wide irregular solid mass which measures 1.4 x 1.3 x 1.3 cm. Additionally, there is a right breast 8 o'clock 5 cm from the nipple hypoechoic circumscribed mass measuring 1.2 x 1.0 x 1.4 cm. This likely represents a mildly complicated cyst. Ultrasound evaluation of the right axilla demonstrates no evidence of lymphadenopathy. IMPRESSION: Suspicious palpable right breast 1 o'clock mass, not seen mammographically due to its far posterior location. Probably benign right breast 8 o'clock cyst. No evidence of right axillary lymphadenopathy.  RECOMMENDATION: Ultrasound-guided core needle biopsy of right breast 1 o'clock mass. Cyst aspiration of probably benign right breast 8 o'clock cyst. Contrast-enhanced breast MRI may be needed, if pathology results are malignant, because of patient's age being less than 29 and the presence of dense breast parenchyma. I have discussed the findings and recommendations with the patient. Results were also provided in writing at the conclusion of the visit. If applicable, a reminder letter will be sent to the patient regarding the next appointment. BI-RADS CATEGORY  4: Suspicious. Electronically Signed   By: Fidela Salisbury M.D.   On: 12/20/2017 10:20   Mm Clip Placement Right  Result Date: 12/29/2017 CLINICAL DATA:  Post biopsy mammogram of the right breast for clip placement. EXAM: DIAGNOSTIC RIGHT MAMMOGRAM POST ULTRASOUND BIOPSY COMPARISON:  Previous exam(s). FINDINGS: Mammographic images were obtained following ultrasound guided biopsy of right breast mass at 1 o'clock. Due to the far superior and posterior  positioning of this mass, the ribbon shaped biopsy marking clip can only be seen on the MLO view, however the tomosynthesis images confirm that it lies within the mass. IMPRESSION: Appropriate positioning of the ribbon shaped biopsy marking clip in the upper inner right breast. Final Assessment: Post Procedure Mammograms for Marker Placement Electronically Signed   By: Ammie Ferrier M.D.   On: 12/29/2017 08:48   [place] Rt Breast Bx W Loc Dev 1st Lesion Img Bx Spec US Guide  Addendum Date: 12/31/2017   ADDENDUM REPORT: 12/31/2017 08:06 ADDENDUM: Pathology revealed GRADE III - INVASIVE DUCTAL CARCINOMA of RIGHT breast, 1 o'clock. This was found to be concordant by Dr. Ammie Ferrier. Pathology results were discussed with the patient by telephone. The patient reported doing well after the biopsy with tenderness at the site. Post biopsy instructions and care were reviewed and questions were answered. The  patient was encouraged to call [facility] for any additional concerns. The patient was referred to [facility] on January 12, 2018, per patient request, as she is out of town. Surgical considerations: the mass will likely not be amenable to mammogram guided seed localization, but can be performed with ultrasound guidance. However, if neoadjuvant chemotherapy is planned, we should place a hydromark clip in the mass prior to starting therapy because the clip and mass may not be well seen following treatment. Contrast-enhanced breast MRI may be needed due to patient's age being less than 70 and the presence of dense breast parenchyma per previous diagnostic report. Pathology results reported by Roselind Messier, RN on 12/31/2017. Electronically Signed   By: Ammie Ferrier M.D.   On: 12/31/2017 08:06   Result Date: 12/31/2017 CLINICAL DATA:  47 year old female presenting for ultrasound-guided biopsy of a right breast mass and aspiration of a right breast cyst. EXAM: ULTRASOUND GUIDED RIGHT BREAST CORE NEEDLE BIOPSY COMPARISON:  Previous exam(s). FINDINGS: I met with the patient and we discussed the procedure of ultrasound-guided biopsy, including benefits and alternatives. We discussed the high likelihood of a successful procedure. We discussed the risks of the procedure, including infection, bleeding, tissue injury, clip migration, and inadequate sampling. Informed written consent was given. The usual time-out protocol was performed immediately prior to the procedure. Lesion quadrant: Upper-inner quadrant Using sterile technique and 1% Lidocaine as local anesthetic, under direct ultrasound visualization, a 14 gauge spring-loaded device was used to perform biopsy of a mass in the far superior upper inner quadrant of the right breast using an inferior approach. At the conclusion of the procedure a ribbon shaped tissue  marker clip was deployed into the biopsy cavity. Follow up 2 view mammogram was performed and dictated separately. Next, using sterile technique and 1% lidocaine as local anesthetic, under direct ultrasound visualization a cyst at 8 o'clock was aspirated using an inferior approach. The cyst completely collapsed with aspiration, and the fluid was discarded. IMPRESSION: 1. Ultrasound guided biopsy of a right breast mass at 1 o'clock. No apparent complications. 2. Ultrasound-guided aspiration of a right breast cyst at 8 o'clock. No apparent complications. Electronically Signed: By: Ammie Ferrier M.D. On: 12/29/2017 08:27   US Breast Aspiration Right  Addendum Date: 12/31/2017   ADDENDUM REPORT: 12/31/2017 08:06 ADDENDUM: Pathology revealed GRADE III - INVASIVE DUCTAL CARCINOMA of RIGHT breast, 1 o'clock. This was found to be concordant by Dr. Ammie Ferrier. Pathology results were discussed with the patient by telephone. The patient reported doing well after  the biopsy with tenderness at the site. Post biopsy instructions and care were reviewed and questions were answered. The patient was encouraged to call [facility] for any additional concerns. The patient was referred to [facility] on January 12, 2018, per patient request, as she is out of town. Surgical considerations: the mass will likely not be amenable to mammogram guided seed localization, but can be performed with ultrasound guidance. However, if neoadjuvant chemotherapy is planned, we should place a hydromark clip in the mass prior to starting therapy because the clip and mass may not be well seen following treatment. Contrast-enhanced breast MRI may be needed due to patient's age being less than 64 and the presence of dense breast parenchyma per previous diagnostic report. Pathology results reported by Roselind Messier, RN on 12/31/2017. Electronically  Signed   By: Ammie Ferrier M.D.   On: 12/31/2017 08:06   Result Date: 12/31/2017 CLINICAL DATA:  47 year old female presenting for ultrasound-guided biopsy of a right breast mass and aspiration of a right breast cyst. EXAM: ULTRASOUND GUIDED RIGHT BREAST CORE NEEDLE BIOPSY COMPARISON:  Previous exam(s). FINDINGS: I met with the patient and we discussed the procedure of ultrasound-guided biopsy, including benefits and alternatives. We discussed the high likelihood of a successful procedure. We discussed the risks of the procedure, including infection, bleeding, tissue injury, clip migration, and inadequate sampling. Informed written consent was given. The usual time-out protocol was performed immediately prior to the procedure. Lesion quadrant: Upper-inner quadrant Using sterile technique and 1% Lidocaine as local anesthetic, under direct ultrasound visualization, a 14 gauge spring-loaded device was used to perform biopsy of a mass in the far superior upper inner quadrant of the right breast using an inferior approach. At the conclusion of the procedure a ribbon shaped tissue marker clip was deployed into the biopsy cavity. Follow up 2 view mammogram was performed and dictated separately. Next, using sterile technique and 1% lidocaine as local anesthetic, under direct ultrasound visualization a cyst at 8 o'clock was aspirated using an inferior approach. The cyst completely collapsed with aspiration, and the fluid was discarded. IMPRESSION: 1. Ultrasound guided biopsy of a right breast mass at 1 o'clock. No apparent complications. 2. Ultrasound-guided aspiration of a right breast cyst at 8 o'clock. No apparent complications. Electronically Signed: By: Ammie Ferrier M.D. On: 12/29/2017 08:27       IMPRESSION/PLAN: 1. Stage IA, cT1cN0M0 grade 3, triple positive invasive ductal carcinoma of the right breast. Dr. [name] discusses the pathology findings and reviews the nature of triple positive breast  disease. The consensus from the breast conference includes staging imaging with CT and Bone Scan. She is a candidate for breast conservation with lumpectomy with  sentinel node biopsy. She would move forward with chemotherapy, and after completion, the patient's course would then be followed by external radiotherapy to the breast followed by antiestrogen therapy. We discussed the risks, benefits, short, and long term effects of radiotherapy, and the patient is interested in proceeding. Dr. [name] discusses the delivery and logistics of radiotherapy and anticipates a course of 6 1/2 weeks of radiotherapy. We will see her back about 3-4 weeks after completing chemotherapy to discuss the simulation process and anticipate we starting radiotherapy about a month after chemotherapy.  2. Possible genetic predisposition to malignancy. The patient is a candidate for genetic testing given her personal history.She was offered referral and is going to complete her consult with genetics today.Marland Kitchen  The above documentation reflects my direct findings during this shared patient visit. Please see the separate note by Dr. [name] on this date for the remainder of the patient's plan of care.    Carola Rhine, PAC

## 2018-01-12 NOTE — H&P (Signed)
Monica [name] Documented: 01/12/2018 7:32 AM Location: [facility] Patient #: [id number] DOB: 30-May-1971 Undefined / Language: Monica [name] / Race: White Female  History of Present Illness Monica Moores A. Kassey Laforest MD; 01/12/2018 11:42 AM) Patient words: Pt sent at the request of Dr Monica [name] for 2 month hx of palpable right upper breast mass. It is not painful, drainig or red. It is getteing larger. 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 No other complaints.  The patient is a 47 year old female.   Past Surgical History Monica Pummel, RN; 01/12/2018 7:33 AM) Breast Biopsy Right. Hysterectomy (not due to cancer) - Partial  Diagnostic Studies History Monica Pummel, RN; 01/12/2018 7:33 AM) Colonoscopy never Mammogram within last year Pap Smear 1-5 years ago  Medication History Monica Pummel, RN; 01/12/2018 7:33 AM) Medications Reconciled  Social History Monica Pummel, RN; 01/12/2018 7:33 AM) Alcohol use Occasional alcohol use. Caffeine use Carbonated beverages, Coffee, Tea. No drug use Tobacco use Never smoker.  Family History Monica Pummel, RN; 01/12/2018 7:33 AM) Arthritis Family Members In General. Breast Cancer Family Members In General. Cancer Family Members In General, Father. Hypertension Family Members In General. Melanoma Mother. Migraine Headache Daughter, Family Members In General, Sister. Respiratory Condition Family Members In General. Thyroid problems Family Members In General, Sister.  Pregnancy / Birth History Monica Pummel, RN; 01/12/2018 7:33 AM) Age at menarche 40 years. Contraceptive History Oral contraceptives. Gravida 2 Maternal age 6-30 Para 2 Regular periods  Other Problems Monica Pummel, RN; 01/12/2018 7:33 AM) Back Pain Lump In Breast Transfusion history     Review of Systems Monica Spillers Ledford RN; 01/12/2018 7:33 AM) General Present- Night Sweats and Weight Loss. Not  Present- Appetite Loss, Chills, Fatigue, Fever and Weight Gain. Skin Present- Dryness. Not Present- Change in Wart/Mole, Hives, Jaundice, New Lesions, Non-Healing Wounds, Rash and Ulcer. HEENT Present- Seasonal Allergies and Wears glasses/contact lenses. Not Present- Earache, Hearing Loss, Hoarseness, Nose Bleed, Oral Ulcers, Ringing in the Ears, Sinus Pain, Sore Throat, Visual Disturbances and Yellow Eyes. Respiratory Present- Snoring. Not Present- Bloody sputum, Chronic Cough, Difficulty Breathing and Wheezing. Breast Not Present- Breast Mass, Breast Pain, Nipple Discharge and Skin Changes. Cardiovascular Not Present- Chest Pain, Difficulty Breathing Lying Down, Leg Cramps, Palpitations, Rapid Heart Rate, Shortness of Breath and Swelling of Extremities. Gastrointestinal Present- Constipation. Not Present- Abdominal Pain, Bloating, Bloody Stool, Change in Bowel Habits, Chronic diarrhea, Difficulty Swallowing, Excessive gas, Gets full quickly at meals, Hemorrhoids, Indigestion, Nausea, Rectal Pain and Vomiting. Female Genitourinary Not Present- Frequency, Nocturia, Painful Urination, Pelvic Pain and Urgency. Musculoskeletal Not Present- Back Pain, Joint Pain, Joint Stiffness, Muscle Pain, Muscle Weakness and Swelling of Extremities. Neurological Not Present- Decreased Memory, Fainting, Headaches, Numbness, Seizures, Tingling, Tremor, Trouble walking and Weakness. Psychiatric Not Present- Anxiety, Bipolar, Change in Sleep Pattern, Depression, Fearful and Frequent crying. Endocrine Present- Cold Intolerance. Not Present- Excessive Hunger, Hair Changes, Heat Intolerance, Hot flashes and New Diabetes. Hematology Present- Easy Bruising and Excessive bleeding. Not Present- Blood Thinners, Gland problems, HIV and Persistent Infections.   Physical Exam (Monica Buchberger A. Troyce Febo MD; 01/12/2018 11:43 AM)  General Mental Status-Alert. General Appearance-Consistent with stated age. Hydration-Well  hydrated. Voice-Normal.  Head and Neck Head-normocephalic, atraumatic with no lesions or palpable masses. Trachea-midline. Thyroid Gland Characteristics - normal size and consistency.  Eye Eyeball - Bilateral-Extraocular movements intact. Sclera/Conjunctiva - Bilateral-No scleral icterus.  Chest and Lung Exam Chest and lung exam reveals -quiet, even and easy respiratory effort with no use  of accessory muscles and on auscultation, normal breath sounds, no adventitious sounds and normal vocal resonance. Inspection Chest Wall - Normal. Back - normal.  Breast Note: 1 cm mass upper right breast mobile left breast normal  Cardiovascular Cardiovascular examination reveals -normal heart sounds, regular rate and rhythm with no murmurs and normal pedal pulses bilaterally.  Neurologic Neurologic evaluation reveals -alert and oriented x 3 with no impairment of recent or remote memory. Mental Status-Normal.  Lymphatic Head & Neck  General Head & Neck Lymphatics: Bilateral - Description - Normal. Axillary  General Axillary Region: Bilateral - Description - Normal. Tenderness - Non Tender.    Assessment & Plan (Monica Buley A. 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 Pt agrees to proceed. Risk of sentinel lymph node mapping include bleeding, infection, lymphedema, shoulder pain. stiffness, dye allergy. cosmetic deformity , blood clots, death, need for more surgery. Pt agres to proceed. Pt requires port placement for chemotherapy. Risk include bleeding, infection, pneumothorax, hemothorax, mediastinal injury, nerve injury , blood vessel injury, strke,  blood clots, death, migration. embolization and need for additional procedures. Pt agrees to proceed.  Current Plans You are being scheduled for surgery- Our schedulers will call you.  You should hear from our office's scheduling department within 5 working days about the location, date, and time of surgery. We try to make accommodations for patient's preferences in scheduling surgery, but sometimes the OR schedule or the surgeon's schedule prevents [place] from making those accommodations.  If you have not heard from our office [phone]) in 5 working days, call the office and ask for your surgeon's nurse.  If you have other questions about your diagnosis, plan, or surgery, call the office and ask for your surgeon's nurse.  Pt Education - [facility] Breast Cancer Information Given - [facility] "Breast Journey" Package Pt Education - Pamphlet Given - Breast Biopsy: discussed with patient and provided information. We discussed the staging and pathophysiology of breast cancer. We discussed all of the different options for treatment for breast cancer including surgery, chemotherapy, radiation therapy, Herceptin, and antiestrogen therapy. We discussed a sentinel lymph node biopsy as she does not appear to having lymph node involvement right now. We discussed the performance of that with injection of radioactive tracer and blue dye. We discussed that she would have an incision underneath her axillary hairline. We discussed that there is a bout a 10-20% chance of having a positive node with a sentinel lymph node biopsy and we will await the permanent pathology to make any other first further decisions in terms of her treatment. One of these options might be to return to the operating room to perform an axillary lymph node dissection. We discussed about a 1-2% risk lifetime of chronic shoulder pain as well as lymphedema associated with a sentinel lymph node biopsy. We discussed the options for treatment of the  breast cancer which included lumpectomy versus a mastectomy. We discussed the performance of the lumpectomy with a wire placement. We discussed a 10-20% chance of a positive margin requiring reexcision in the operating room. We also discussed that she may need radiation therapy or antiestrogen therapy or both if she undergoes lumpectomy. We discussed the mastectomy and the postoperative care for that as well.  We discussed that there is no difference in her survival whether she undergoes lumpectomy with radiation therapy or antiestrogen therapy versus a mastectomy. There is a slight difference in the local recurrence rate being 3-5% with lumpectomy and about 1% with a mastectomy. We discussed the risks of operation including bleeding, infection, possible reoperation. She understands her further therapy will be based on what her stages at the time of her operation.

## 2018-01-12 NOTE — Patient Instructions (Signed)

## 2018-01-12 NOTE — Progress Notes (Signed)
REFERRING PROVIDER: Chauncey Cruel, MD [address] [place], [place] [address]  PRIMARY PROVIDER:  [name], PA-C  PRIMARY REASON FOR VISIT:  1. Malignant neoplasm of upper-inner quadrant of right breast in female, estrogen receptor positive ([place])   2. Family history of breast cancer      HISTORY OF PRESENT ILLNESS:   Monica [name], a 47 y.o. female, was seen for a [place] cancer genetics consultation at the request of Dr. Jana Hakim due to a personal history of cancer.  [name] presents to clinic today to discuss the possibility of a hereditary predisposition to cancer, genetic testing, and to further clarify her future cancer risks, as well as potential cancer risks for family members.   CANCER HISTORY:    Malignant neoplasm of upper-inner quadrant of right breast in female, estrogen receptor positive ([place])   01/05/2018 Initial Diagnosis    Malignant neoplasm of upper-inner quadrant of right breast in female, estrogen receptor positive ([place])      01/26/2018 -  Chemotherapy    The patient had TRASTUZUMAB CHEMO IV INFUSION, 4 mg/kg, Intravenous,  Once, 0 of 16 cycles PACLITAXEL CHEMO IV INFUSION ORDERABLE (</= 80 MG/M2), 80 mg/m2, Intravenous,  Once, 0 of 3 cycles  for chemotherapy treatment.         HORMONAL RISK FACTORS:  Menarche was at age 42.  First live birth at age 56.  OCP use for approximately 2 years.  Ovaries intact: yes.  Hysterectomy: yes.  Menopausal status: premenopausal.  HRT use: 0 years. Colonoscopy: no; not examined. Mammogram within the last year: yes.  Past Medical History:  Diagnosis Date  . Arthritis    lower back  . Family history of breast cancer   . Sleep apnea    borderline, no cpap ordered, encouraged to loss weight    Past Surgical History:  Procedure Laterality Date  . ABDOMINAL HYSTERECTOMY    . HYMENECTOMY    . REPAIR VAGINAL CUFF N/A 02/07/2017   Procedure: REPAIR VAGINAL CUFF;  Surgeon: Ena Dawley,  MD;  Location: [facility];  Service: Gynecology;  Laterality: N/A;  . ROBOTIC ASSISTED TOTAL HYSTERECTOMY WITH SALPINGECTOMY Left 01/20/2017   Procedure: ROBOTIC ASSISTED TOTAL HYSTERECTOMY WITH SALPINGECTOMY;  Surgeon: [name], MD;  Location: [facility];  Service: Gynecology;  Laterality: Left;    Social History   Socioeconomic History  . Marital status: Married    Spouse name: Not on file  . Number of children: Not on file  . Years of education: Not on file  . Highest education level: Not on file  Occupational History  . Not on file  Social Needs  . Financial resource strain: Not on file  . Food insecurity:    Worry: Not on file    Inability: Not on file  . Transportation needs:    Medical: Not on file    Non-medical: Not on file  Tobacco Use  . Smoking status: Never Smoker  . Smokeless tobacco: Never Used  Substance and Sexual Activity  . Alcohol use: Yes    Comment: occ  . Drug use: No  . Sexual activity: Not on file  Lifestyle  . Physical activity:    Days per week: Not on file    Minutes per session: Not on file  . Stress: Not on file  Relationships  . Social connections:    Talks on phone: Not on file    Gets together: Not on file    Attends religious service: Not on file  Active member of club or organization: Not on file    Attends meetings of clubs or organizations: Not on file    Relationship status: Not on file  Other Topics Concern  . Not on file  Social History Narrative  . Not on file     FAMILY HISTORY:  We obtained a detailed, 4-generation family history.  Significant diagnoses are listed below: Family History  Problem Relation Age of Onset  . Lung cancer Maternal Grandfather   . Esophageal cancer Paternal Grandfather 69  . Breast cancer Cousin 60   Ms. Vessel's children: 1 son, [name] and 1 daughter, [name], both with no cancer history Ms. Fritze's siblings: 5 sisters in their 27s and 68s, no cancer history and 1 brother, no cancer  history Ms. Grine's father: 75, "precancerous esophageal cancer", nonsmoker Paternal aunts/Uncles: 2 paternal aunts, no cancer history Paternal cousins: 2 female paternal cousins, no cancer history Paternal grandfather: Deceased, 74s, esophogeal cancer, smoker Paternal grandmother: Deceased, 27s, dementia  Ms. Watton's mother: 2, history of melanoma  Maternal Aunts/Uncles: 1 maternal aunt with history of squamous cell carcinoma, 6 other aunts/uncles with no cancer history Maternal cousins: 1 cousin with breast cancer diagnosed at 32, metastatic, other cousins do not have cancer history Maternal grandfather: Deceased, lung cancer, asbestos exposure Maternal grandmother: 71, history of basal cell carcinoma on nose  Ms. Dismuke is unaware of previous family history of genetic testing for hereditary cancer risks. Patient's maternal ancestors are of European descent, and paternal ancestors are of European descent. There is no reported Ashkenazi Jewish ancestry. There is no known consanguinity.  GENETIC COUNSELING ASSESSMENT: Nayda Riesen is a 47 y.o. female with a personal and family history which is somewhat suggestive of a Hereditary Cancer Predisposition Syndrome. We, therefore, discussed and recommended the following at today's visit.   DISCUSSION: We reviewed the characteristics, features and inheritance patterns of hereditary cancer syndromes. We also discussed genetic testing, including the appropriate family members to test, the process of testing, insurance coverage and turn-around-time for results. We discussed the implications of a negative, positive and/or variant of uncertain significant result. We recommended Ms. Chard pursue genetic testing for the CancerNext gene panel.   We discussed that only 5-10% of cancers are associated with a Hereditary cancer predisposition syndrome.  One of the most common hereditary cancer syndromes that increases breast cancer risk is called Hereditary  Breast and Ovarian Cancer (HBOC) syndrome.  This syndrome is caused by mutations in the BRCA1 and BRCA2 genes.  This syndrome increases an individual's lifetime risk to develop breast, ovarian, pancreatic, and other types of cancer.  There are also many other cancer predisposition syndromes caused by mutations in several other genes.  We discussed that if she is found to have a mutation in one of these genes, it may impact surgical decisions, and alter future medical management recommendations such as increased cancer screenings and consideration of risk reducing surgeries.  A positive result could also have implications for the patient's family members.  A Negative result would mean we were unable to identify a hereditary component to her cancer, but does not rule out the possibility of a hereditary basis for her cancer.  There could be mutations that are undetectable by current technology, or in genes not yet tested or identified to increase cancer risk.    We discussed the potential to find a Variant of Uncertain Significance or VUS.  These are variants that have not yet been identified as pathogenic or benign, and it is  unknown if this variant is associated with increased cancer risk or if this is a normal finding.  Most VUS's are reclassified to benign or likely benign.   It should not be used to make medical management decisions. With time, we suspect the lab will determine the significance of any VUS's identified if any.   Based on Ms. Goodley's personal and family history of cancer, she meets medical criteria for genetic testing. Despite that she meets criteria, she may still have an out of pocket cost. We discussed that if her out of pocket cost for testing is over $100, the laboratory will call and confirm whether she wants to proceed with testing.  If the out of pocket cost of testing is less than $100 she will be billed by the genetic testing laboratory.   PLAN: After considering the risks,  benefits, and limitations, Ms. Sugarman  provided informed consent to pursue genetic testing and the blood sample was sent to [facility] for analysis of the CancerNext gene panel. The CancerNext gene panel offered by [employer] includes sequencing and rearrangement analysis for the following 32 genes:   APC, ATM, BARD1, BMPR1A, BRCA1, BRCA2, BRIP1, CDH1, CDK4, CDKN2A, CHEK2, DICER1, EPCAM, GREM1, HOXB13MLH1, MRE11A, MSH2, MSH6, MUTYH, NBN, NF1, PALB2, PMS2, POLD1, POLE, PTEN, RAD50, RAD51D, SMAD4, SMARCA4, STK11, and TP53. Results should be available within approximately 2 weeks' time, at which point they will be disclosed by telephone to Ms. Mondry, as will any additional recommendations warranted by these results. Ms. Haberkorn will receive a summary of her genetic counseling visit and a copy of her results once available. This information will also be available in Epic. We encouraged Ms. Fester to remain in contact with cancer genetics annually so that we can continuously update the family history and inform her of any changes in cancer genetics and testing that may be of benefit for her family. Ms. Hegler questions were answered to her satisfaction today. Our contact information was provided should additional questions or concerns arise.  Ms.  Neuhaus questions were answered to her satisfaction today. Our contact information was provided should additional questions or concerns arise. Thank you for the referral and allowing [place] to share in the care of your patient.   Epimenio Foot, MS Genetic Counselor [email or web address] Phone: [phone]  The patient was seen for a total of 30 minutes in face-to-face genetic counseling.  The patient was accompanied today by her son, [name] and her daughter, [name]. This patient was discussed with Drs. Magrinat, Lindi Adie and/or Burr Medico who agrees with the above.

## 2018-01-12 NOTE — Therapy (Signed)
[place], [place], [address] Phone: [phone]   Fax:  [phone]  Physical Therapy Evaluation  Patient Details  Name: Monica [name] MRN: [id number] Date of Birth: 04-Jul-1971 Referring Provider: Dr. [name]   Encounter Date: 01/12/2018  PT End of Session - 01/12/18 1533    Visit Number  1    Number of Visits  2    Date for PT Re-Evaluation  03/09/18    PT Start Time  0924    PT Stop Time  0949    PT Time Calculation (min)  25 min    Activity Tolerance  Patient tolerated treatment well    Behavior During Therapy  [facility] for tasks assessed/performed       Past Medical History:  Diagnosis Date  . Arthritis    lower back  . Family history of breast cancer   . Sleep apnea    borderline, no cpap ordered, encouraged to loss weight    Past Surgical History:  Procedure Laterality Date  . ABDOMINAL HYSTERECTOMY    . HYMENECTOMY    . REPAIR VAGINAL CUFF N/A 02/07/2017   Procedure: REPAIR VAGINAL CUFF;  Surgeon: Ena Dawley, MD;  Location: [facility];  Service: Gynecology;  Laterality: N/A;  . ROBOTIC ASSISTED TOTAL HYSTERECTOMY WITH SALPINGECTOMY Left 01/20/2017   Procedure: ROBOTIC ASSISTED TOTAL HYSTERECTOMY WITH SALPINGECTOMY;  Surgeon: [name], MD;  Location: [facility];  Service: Gynecology;  Laterality: Left;    There were no vitals filed for this visit.   Subjective Assessment - 01/12/18 1450    Subjective  Patient reports she is here today to be seen by her medical team for her newly diagnosed right breast cancer.    Patient is accompained by:  Family member    Pertinent History  Patient was diagnosed on 12/20/17 with right triple positive breast cancer. It measures 1.4 cm and is located in the upper inner quadrant. The Ki67 is 80%. She had a hysterectomy in 01/2017.    Patient Stated Goals  Reduce lymphedema risk and learn post op shoulder ROM HEP    Currently in Pain?  Yes    Pain  Score  0-No pain    Pain Location  Back    Pain Orientation  Right;Left;Lower    Pain Descriptors / Indicators  Aching    Pain Type  Chronic pain    Pain Onset  More than a month ago    Pain Frequency  Intermittent    Aggravating Factors   Unknown    Pain Relieving Factors  Unknown    Multiple Pain Sites  No         [facility] PT Assessment - 01/12/18 0001      Assessment   Medical Diagnosis  Right breast cancer    Referring Provider  Dr. [name] Cornett    Onset Date/Surgical Date  12/20/17    Hand Dominance  Left    Prior Therapy  none      Precautions   Precautions  Other (comment)    Precaution Comments  active cancer      Restrictions   Weight Bearing Restrictions  No      Balance Screen   Has the patient fallen in the past 6 months  No    Has the patient had a decrease in activity level because of a fear of falling?   No    Is the patient reluctant to leave their home because of a fear of  falling?   No      Home Environment   Living Environment  Private residence    Living Arrangements  Children 47 y.o. daughter    Available Help at Discharge  Family      Prior Function   Level of Independence  Independent    Vocation  Full time employment    Industrial/product designer    Leisure  She walks daily for 30 min and does some UE weights 2-3x/week      Cognition   Overall Cognitive Status  Within Functional Limits for tasks assessed      Posture/Postural Control   Posture/Postural Control  Postural limitations    Postural Limitations  Rounded Shoulders;Forward head      ROM / Strength   AROM / PROM / Strength  AROM;Strength      AROM   AROM Assessment Site  Shoulder;Cervical    Right/Left Shoulder  Right;Left    Right Shoulder Extension  49 Degrees    Right Shoulder Flexion  150 Degrees    Right Shoulder ABduction  170 Degrees    Right Shoulder Internal Rotation  60 Degrees    Right Shoulder External Rotation  81 Degrees    Left Shoulder  Extension  45 Degrees    Left Shoulder Flexion  148 Degrees    Left Shoulder ABduction  170 Degrees    Left Shoulder Internal Rotation  63 Degrees    Left Shoulder External Rotation  90 Degrees    Cervical Flexion  WNL    Cervical Extension  WNL    Cervical - Right Side Bend  WNL    Cervical - Left Side Bend  WNL    Cervical - Right Rotation  WNL    Cervical - Left Rotation  WNL      Strength   Overall Strength  Within functional limits for tasks performed        LYMPHEDEMA/ONCOLOGY QUESTIONNAIRE - 01/12/18 1531      Type   Cancer Type  Right breast cancer      Lymphedema Assessments   Lymphedema Assessments  Upper extremities      Right Upper Extremity Lymphedema   10 cm Proximal to Olecranon Process  28.2 cm    15 cm Proximal to Ulnar Styloid Process  25.4 cm    Just Proximal to Ulnar Styloid Process  22.1 cm    Across Hand at [employer]  14.7 cm    At [place] of 2nd Digit  19.1 cm    At [facility] of Thumb  6.1 cm      Left Upper Extremity Lymphedema   10 cm Proximal to Olecranon Process  28.4 cm    Olecranon Process  25.4 cm    10 cm Proximal to Ulnar Styloid Process  21.8 cm    Just Proximal to Ulnar Styloid Process  14.7 cm    Across Hand at [employer]  19.5 cm    At [place] of 2nd Digit  6.2 cm             Objective measurements completed on examination: See above findings.    Patient was instructed today in a home exercise program today for post op shoulder range of motion. These included active assist shoulder flexion in sitting, scapular retraction, wall walking with shoulder abduction, and hands behind head external rotation.  She was encouraged to do these twice a day, holding 3 seconds and repeating 5 times when permitted  by her physician.      PT Education - 01/12/18 1532    Education Details  Lymphedema risk reduction and post op shoulder ROM HEP    Person(s) Educated  Patient;Child(ren)    Methods  Explanation;Demonstration;Handout     Comprehension  Returned demonstration;Verbalized understanding          PT Long Term Goals - 01/12/18 1542      PT LONG TERM GOAL #1   Title  Patient will demonstrate she has returned to basleine related to shoulder ROM and function.    Time  8    Period  Weeks    Status  New      Breast Clinic Goals - 01/12/18 1542      Patient will be able to verbalize understanding of pertinent lymphedema risk reduction practices relevant to her diagnosis specifically related to skin care.   Time  1    Period  Days    Status  Achieved      Patient will be able to return demonstrate and/or verbalize understanding of the post-op home exercise program related to regaining shoulder range of motion.   Time  1    Period  Days    Status  Achieved      Patient will be able to verbalize understanding of the importance of attending the postoperative After Breast Cancer Class for further lymphedema risk reduction education and therapeutic exercise.   Time  1    Period  Days    Status  Achieved            Plan - 01/12/18 1536    Clinical Impression Statement  Patient was diagnosed on 12/20/17 with right triple positive breast cancer. It measures 1.4 cm and is located in the upper inner quadrant. The Ki67 is 80%. She had a hysterectomy in 01/2017. Her multidiscipinary medical team met prior to her assessments to determine a recommended treatment plan. She is planning to have a right lumpectomy and sentinel node biopsy followed by chemotherapy, radiation, and anti-estrogen therapy. She will benefit from a post op PT visit to determine needs.    History and Personal Factors relevant to plan of care:  Seperated from spouse and living with teenage daughter; unknown extent of disease    Clinical Presentation  Evolving    Clinical Presentation due to:  Unknown extent of disease until staging scans are performed    Clinical Decision Making  Moderate    Rehab Potential  Excellent    Clinical Impairments  Affecting Rehab Potential  None    PT Frequency  -- Eval and 1 f/u visit    PT Treatment/Interventions  ADLs/Self Care Home Management;Therapeutic exercise;Patient/family education    PT Next Visit Plan  Will reassess 3-4 weeks post op to determine needs    PT Home Exercise Plan  Post op shoulder ROM HEP    Consulted and Agree with Plan of Care  Patient;Family member/caregiver    Family Member Consulted  Daughter and son       Patient will benefit from skilled therapeutic intervention in order to improve the following deficits and impairments:  Decreased knowledge of precautions, Impaired UE functional use, Decreased range of motion, Postural dysfunction, Pain  Visit Diagnosis: Malignant neoplasm of upper-inner quadrant of right breast in female, estrogen receptor positive ([place]) - Plan: PT plan of care cert/re-cert  Abnormal posture - Plan: PT plan of care cert/re-cert   Patient will follow up at outpatient cancer rehab 3-4 weeks following surgery.  If the patient requires physical therapy at that time, a specific plan will be dictated and sent to the referring physician for approval. The patient was educated today on appropriate basic range of motion exercises to begin post operatively and the importance of attending the After Breast Cancer class following surgery.  Patient was educated today on lymphedema risk reduction practices as it pertains to recommendations that will benefit the patient immediately following surgery.  She verbalized good understanding.      Problem List Patient Active Problem List   Diagnosis Date Noted  . Family history of breast cancer 01/12/2018  . Malignant neoplasm of upper-inner quadrant of right breast in female, estrogen receptor positive ([place]) 01/05/2018  . Vaginal bleeding 02/07/2017  . Anemia 02/07/2017  . Orthostasis 02/07/2017  . S/P laparoscopic hysterectomy 01/20/2017    Annia Friendly, PT 01/12/18 3:49 PM  [place], [place], [address] Phone: [phone]   Fax:  [phone]  Name: Monica [name] MRN: [id number] Date of Birth: 07-05-1971

## 2018-01-12 NOTE — Telephone Encounter (Signed)
No 7/3 los, referrals or orders.

## 2018-01-14 ENCOUNTER — Telehealth: Payer: Self-pay | Admitting: *Deleted

## 2018-01-14 NOTE — Telephone Encounter (Signed)
Faxed ROI to [facility]; release [phone]

## 2018-01-18 ENCOUNTER — Telehealth: Payer: Self-pay | Admitting: *Deleted

## 2018-01-18 ENCOUNTER — Other Ambulatory Visit: Payer: Self-pay | Admitting: *Deleted

## 2018-01-18 NOTE — Telephone Encounter (Signed)
Spoke to pt concerning [place] from 7.3.19. Denies questions regarding dx or treatment care plan. Discussed SLN bx and how lymphatic fluid drains from breast to LN. Discussed CT/bone scan will be cx d/t insurance will not cover at this time. Received verbal understanding.  Denies further questions at this time. Encourage pt to call with needs. Received verbal understanding.

## 2018-01-19 ENCOUNTER — Other Ambulatory Visit: Payer: Self-pay | Admitting: Oncology

## 2018-01-19 ENCOUNTER — Other Ambulatory Visit: Payer: Self-pay | Admitting: Surgery

## 2018-01-19 DIAGNOSIS — Z170 Estrogen receptor positive status [ER+]: Secondary | ICD-10-CM

## 2018-01-19 DIAGNOSIS — C50211 Malignant neoplasm of upper-inner quadrant of right female breast: Secondary | ICD-10-CM

## 2018-01-19 DIAGNOSIS — C50911 Malignant neoplasm of unspecified site of right female breast: Secondary | ICD-10-CM

## 2018-01-21 ENCOUNTER — Ambulatory Visit (HOSPITAL_COMMUNITY)
Admission: RE | Admit: 2018-01-21 | Discharge: 2018-01-21 | Disposition: A | Discharge status: Home / Self Care | Payer: Managed Care, Other (non HMO) | Source: Ambulatory Visit | Attending: Oncology | Admitting: Oncology

## 2018-01-21 DIAGNOSIS — Z170 Estrogen receptor positive status [ER+]: Secondary | ICD-10-CM

## 2018-01-21 DIAGNOSIS — G4730 Sleep apnea, unspecified: Secondary | ICD-10-CM | POA: Diagnosis not present

## 2018-01-21 DIAGNOSIS — I517 Cardiomegaly: Secondary | ICD-10-CM | POA: Diagnosis not present

## 2018-01-21 DIAGNOSIS — C50211 Malignant neoplasm of upper-inner quadrant of right female breast: Secondary | ICD-10-CM

## 2018-01-21 NOTE — Progress Notes (Signed)
  Echocardiogram 2D Echocardiogram has been performed.  Merrie Roof F 01/21/2018, 12:17 PM

## 2018-01-24 ENCOUNTER — Other Ambulatory Visit (HOSPITAL_COMMUNITY): Payer: Managed Care, Other (non HMO)

## 2018-01-24 ENCOUNTER — Ambulatory Visit (HOSPITAL_COMMUNITY): Payer: Managed Care, Other (non HMO)

## 2018-01-24 ENCOUNTER — Encounter (HOSPITAL_COMMUNITY): Payer: Managed Care, Other (non HMO)

## 2018-01-25 ENCOUNTER — Telehealth: Payer: Self-pay | Admitting: Oncology

## 2018-01-25 NOTE — Telephone Encounter (Signed)
Returned call to patient re message left wanting to move 1st chemo on 8/29  and subsequent chemo from Thursdays to Friday. Per patient she spoke with GM previously about Fridays given her the week to recover and return to work on Tuesdays. [place] per Chief Strategy Officer.  Moved lab/chemo from 8/29 to 8/30 and from 9/5 to 9/6. Left message for patient and patient to get updated schedule at 8/20 visit.

## 2018-02-03 ENCOUNTER — Encounter: Payer: Self-pay | Admitting: Licensed Clinical Social Worker

## 2018-02-03 ENCOUNTER — Telehealth: Payer: Self-pay | Admitting: Licensed Clinical Social Worker

## 2018-02-03 DIAGNOSIS — Z7189 Other specified counseling: Secondary | ICD-10-CM | POA: Insufficient documentation

## 2018-02-03 DIAGNOSIS — Z1379 Encounter for other screening for genetic and chromosomal anomalies: Secondary | ICD-10-CM | POA: Insufficient documentation

## 2018-02-03 NOTE — Telephone Encounter (Signed)
Spoke with Ms. Leth regarding her negative test results.

## 2018-02-04 ENCOUNTER — Encounter: Payer: Self-pay | Admitting: Licensed Clinical Social Worker

## 2018-02-04 ENCOUNTER — Ambulatory Visit: Payer: Self-pay | Admitting: Licensed Clinical Social Worker

## 2018-02-04 NOTE — Progress Notes (Signed)
HPI:  Monica [name] was previously seen in the [place] clinic on 01/12/2018 due to a personal and family history of breast cancer and concerns regarding a hereditary predisposition to cancer. Please refer to our prior cancer genetics clinic note for more information regarding Monica [name]'s medical, social and family histories, and our assessment and recommendations, at the time. Monica [name] recent genetic test results were disclosed to her, as well as recommendations warranted by these results. These results and recommendations are discussed in more detail below.  CANCER HISTORY:    Malignant neoplasm of upper-inner quadrant of right breast in female, estrogen receptor positive ([place])   01/05/2018 Initial Diagnosis    Malignant neoplasm of upper-inner quadrant of right breast in female, estrogen receptor positive ([place])      01/26/2018 -  Chemotherapy    The patient had TRASTUZUMAB CHEMO IV INFUSION, 4 mg/kg, Intravenous,  Once, 0 of 16 cycles PACLITAXEL CHEMO IV INFUSION ORDERABLE (</= 80 MG/M2), 80 mg/m2, Intravenous,  Once, 0 of 3 cycles  for chemotherapy treatment.        Genetic [name]    Negative genetic [name] on [employer]'s CancerNext 34 gene panel. 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 It should not be used to make medical management decisions.   The CancerNext gene panel offered by [employer] includes sequencing and rearrangement analysis for the following 32 genes:   APC, ATM, BARD1, BMPR1A, BRCA1, BRCA2, BRIP1, CDH1, CDK4, CDKN2A, CHEK2, DICER1, EPCAM, GREM1, HOXB13MLH1, MRE11A, MSH2, MSH6, MUTYH, NBN, NF1, PALB2, PMS2, POLD1, POLE, PTEN, RAD50, RAD51D, SMAD4, SMARCA4, STK11, and TP53.   The report date is 02/02/2018.        FAMILY HISTORY:  We obtained a detailed, 4-generation family history.  Significant diagnoses are listed below: Family History  Problem Relation Age of Onset  . Lung cancer Maternal Grandfather   .  Esophageal cancer Paternal Grandfather 61  . Breast cancer Cousin 64   Monica [name]'s children: 1 son, Monica [name] and 1 daughter, Monica [name], both with no cancer history Monica [name]'s siblings: 5 sisters in their 32s and 43s, no cancer history and 1 brother, no cancer history Monica [name] father: 76, "precancerous esophageal cancer", nonsmoker Paternal aunts/Uncles: 2 paternal aunts, no cancer history Paternal cousins: 2 female paternal cousins, no cancer history Paternal grandfather: Deceased, 56s, esophogeal cancer, smoker Paternal grandmother: Deceased, 63s, dementia  Monica [name]'s mother: 23, history of melanoma           Maternal Aunts/Uncles: 1 maternal aunt with history of squamous cell carcinoma, 6 other aunts/uncles with no cancer history Maternal cousins: 1 cousin with breast cancer diagnosed at 6, metastatic, other cousins do not have cancer history Maternal grandfather: Deceased, lung cancer, asbestos exposure Maternal grandmother: 60, history of basal cell carcinoma on nose  Monica [name] is unaware of previous family history of genetic [name] for hereditary cancer risks. Patient's maternal ancestors are of European descent, and paternal ancestors are of European descent. There is no reported Ashkenazi Jewish ancestry. There is no known consanguinity.  GENETIC TEST RESULTS: Genetic [name] performed through [employer] CancerNext Panel reported out on 02/02/2018 showed no pathogenic mutations.   The CancerNext gene panel offered by [employer] includes sequencing and rearrangement analysis for the following 32 genes:   APC, ATM, BARD1, BMPR1A, BRCA1, BRCA2, BRIP1, CDH1, CDK4, CDKN2A, CHEK2, DICER1, EPCAM, GREM1, HOXB13MLH1, MRE11A, MSH2, MSH6, MUTYH, NBN, NF1, PALB2, PMS2, POLD1, POLE, PTEN, RAD50, RAD51D, SMAD4, SMARCA4, STK11, and TP53. Marland Kitchen  A variant of uncertain significance (VUS) in a gene called MSH6 c.[id number] was also noted.   The test report will be scanned into EPIC and will be  located under the Molecular Pathology section of the Results Review tab. A portion of the result report is included below for reference.     We discussed with Monica [name] that because current genetic [name] is not perfect, it is possible there may be a gene mutation in one of these genes that current [name] cannot detect, but that chance is small.  We also discussed, that there could be another gene that has not yet been discovered, or that we have not yet tested, that is responsible for the cancer diagnoses in the family. It is also possible there is a hereditary cause for the cancer in the family that Monica [name] did not inherit and therefore was not identified in her [name].  Therefore, it is important to remain in touch with cancer genetics in the future so that we can continue to offer Monica [name].     Regarding the VUS in MSH6: At this time, it is unknown if this variant is associated with increased cancer risk or if this is a normal finding, but most variants such as this get reclassified to being inconsequential. It should not be used to make medical management decisions. With time, we suspect the lab will determine the significance of this variant, if any. If we do learn more about it, we will try to contact Monica [name] to discuss it further. However, it is important to stay in touch with [place] periodically and keep the address and phone number up to date.   CANCER SCREENING RECOMMENDATIONS: This negative result means that we were unable to identify a hereditary cause for her personal and family history of cancer at this time.  This result does not rule out a hereditary cause for breast cancer. It is still possible that there could be genetic mutations that are undetectable by current technology, or genetic mutations in genes that have not been tested or identified to increase cancer risk.  Monica [name] test result is considered negative (normal).  This means that  we have not identified a hereditary cause for her personal and family history of cancer at this time.   While reassuring, this does not definitively rule out a hereditary predisposition to cancer. It is still possible that there could be genetic mutations that are undetectable by current technology, or genetic mutations in genes that have not been tested or identified to increase cancer risk.  Therefore, it is recommended she continue to follow the cancer management and screening guidelines provided by her oncology and primary healthcare provider. An individual's cancer risk is not determined by genetic test results alone.  Overall cancer risk assessment includes additional factors such as personal medical history, family history, etc.  These should be used to make a personalized plan for cancer prevention and surveillance.    This result indicates that it is unlikely Ms. Lauman has an increased risk for a future cancer due to a mutation in one of these genes. This normal test also suggests that Ms. Eutsler's cancer was most likely not due to an inherited predisposition associated with one of these genes.  Most cancers happen by chance and this negative test suggests that her cancer may fall into this category.  Therefore, it is recommended she continue to follow the cancer management and screening guidelines provided by her  oncology and primary healthcare provider. Other factors such as her personal and family history may still affect her cancer risk.  RECOMMENDATIONS FOR FAMILY MEMBERS:  Relatives in this family might be at some increased risk of developing cancer, over the general population risk, simply due to the family history of cancer.  We recommended women in this family have a yearly mammogram beginning at age 25, or 51 years younger than the earliest onset of cancer, an annual clinical breast exam, and perform monthly breast self-exams. Women in this family should also have a gynecological exam as  recommended by their primary provider. All family members should have a colonoscopy by age 41 (or as directed by their doctors).  All family members should inform their physicians about the family history of cancer so their doctors can make the most appropriate screening recommendations for them.   It is also possible there is a hereditary cause for the cancer in Ms. Davenport's family that she did not inherit and therefore was not identified in her.  We recommended her cousin with breast cancer diagnosed at 33 have genetic counseling and [name]. Ms. Salts will let us know if we can be of any assistance in coordinating genetic counseling and/or [name] for this family member.  FOLLOW-UP: Lastly, we discussed with Ms. Tonkinson that cancer genetics is a rapidly advancing field and it is possible that new genetic tests will be appropriate for her and/or her family members in the future. We encouraged her to remain in contact with cancer genetics on an annual basis so we can update her personal and family histories and let her know of advances in cancer genetics that may benefit this family.   Our contact number was provided. Ms. Belongia questions were answered to her satisfaction, and she knows she is welcome to call us at anytime with additional questions or concerns.   Monica Foot, MS Genetic Counselor [email or web address] Phone: [phone]

## 2018-02-08 ENCOUNTER — Other Ambulatory Visit: Payer: Self-pay

## 2018-02-08 ENCOUNTER — Encounter (HOSPITAL_BASED_OUTPATIENT_CLINIC_OR_DEPARTMENT_OTHER): Payer: Self-pay | Admitting: *Deleted

## 2018-02-08 NOTE — Pre-Procedure Instructions (Signed)
Pt is coming Monday to pick up Ensure.

## 2018-02-09 ENCOUNTER — Ambulatory Visit (HOSPITAL_COMMUNITY)
Admission: RE | Admit: 2018-02-09 | Discharge: 2018-02-09 | Disposition: A | Discharge status: Home / Self Care | Payer: Managed Care, Other (non HMO) | Source: Ambulatory Visit | Attending: Cardiology | Admitting: Cardiology

## 2018-02-09 VITALS — BP 133/83 | HR 55 | Wt 171.6 lb

## 2018-02-09 DIAGNOSIS — Z808 Family history of malignant neoplasm of other organs or systems: Secondary | ICD-10-CM | POA: Insufficient documentation

## 2018-02-09 DIAGNOSIS — C50911 Malignant neoplasm of unspecified site of right female breast: Secondary | ICD-10-CM | POA: Insufficient documentation

## 2018-02-09 DIAGNOSIS — Z170 Estrogen receptor positive status [ER+]: Secondary | ICD-10-CM | POA: Diagnosis present

## 2018-02-09 DIAGNOSIS — Z803 Family history of malignant neoplasm of breast: Secondary | ICD-10-CM | POA: Diagnosis not present

## 2018-02-09 DIAGNOSIS — Z801 Family history of malignant neoplasm of trachea, bronchus and lung: Secondary | ICD-10-CM | POA: Insufficient documentation

## 2018-02-09 DIAGNOSIS — C50211 Malignant neoplasm of upper-inner quadrant of right female breast: Secondary | ICD-10-CM | POA: Diagnosis not present

## 2018-02-09 NOTE — Patient Instructions (Signed)
Echocardiogram and Follow up with Dr. Aundra Dubin.

## 2018-02-10 NOTE — Progress Notes (Signed)
Oncology: Dr. Jana Hakim  47 yo with minimal past history but recently diagnosed breast cancer was referred by Dr. Jana Hakim for cardio-oncology evaluation.  Right breast cancer was diagnosed in 6/19, ER+/PR+/HER2+.  She will have surgery followed by Paclitaxel + Herceptin x 12 cycles then Herceptin alone to complete a year.   She has no prior cardiac history. She vapes but never smoked.  No exertional dyspnea or chest pain.  No history of syncope or palpitations.   PMH: 1. Arthritis 2. Breast cancer: Right breast cancer was diagnosed in 6/19, ER+/PR+/HER2+.  She will have surgery followed by Paclitaxel + Herceptin x 12 cycles then Herceptin alone to complete a year.  - Echo (7/19): EF 55-60%, GLS -23%, RV moderately dilated with normal systolic function.   Social History   Socioeconomic History  . Marital status: Legally Separated    Spouse name: Not on file  . Number of children: Not on file  . Years of education: Not on file  . Highest education level: Not on file  Occupational History  . Not on file  Social Needs  . Financial resource strain: Not on file  . Food insecurity:    Worry: Not on file    Inability: Not on file  . Transportation needs:    Medical: Not on file    Non-medical: Not on file  Tobacco Use  . Smoking status: Never Smoker  . Smokeless tobacco: Never Used  Substance and Sexual Activity  . Alcohol use: Yes    Comment: occ  . Drug use: No  . Sexual activity: Not Currently    Birth control/protection: Surgical  Lifestyle  . Physical activity:    Days per week: Not on file    Minutes per session: Not on file  . Stress: Not on file  Relationships  . Social connections:    Talks on phone: Not on file    Gets together: Not on file    Attends religious service: Not on file    Active member of club or organization: Not on file    Attends meetings of clubs or organizations: Not on file    Relationship status: Not on file  . Intimate partner violence:   Fear of current or ex partner: Not on file    Emotionally abused: Not on file    Physically abused: Not on file    Forced sexual activity: Not on file  Other Topics Concern  . Not on file  Social History Narrative  . Not on file   Family History  Problem Relation Age of Onset  . Lung cancer Maternal Grandfather   . Esophageal cancer Paternal Grandfather 53  . Breast cancer Cousin 31   ROS: All systems reviewed and negative except as per HPI.  No current outpatient medications on file.   No current facility-administered medications for this encounter.    BP 133/83   Pulse (!) 55   Wt 171 lb 9.6 oz (77.8 kg)   LMP 12/21/2016 (Exact Date)   SpO2 100%   BMI 26.09 kg/m  General: NAD Neck: No JVD, no thyromegaly or thyroid nodule.  Lungs: Clear to auscultation bilaterally with normal respiratory effort. CV: Nondisplaced PMI.  Heart regular S1/S2, no S3/S4, no murmur.  No peripheral edema.  No carotid bruit.  Normal pedal pulses.  Abdomen: Soft, nontender, no hepatosplenomegaly, no distention.  Skin: Intact without lesions or rashes.  Neurologic: Alert and oriented x 3.  Psych: Normal affect. Extremities: No clubbing or cyanosis.  HEENT:  Normal.   Assessment/Plan:  Patient has breast cancer with plan for treatment that will include 1 year of Herceptin.  We discussed the potential cardiac effects of Herceptin and the reasoning behind echo screening.  I reviewed her recent baseline echo, normal EF and strain pattern. She will start Herceptin on 03/11/18.  - She will return for followup and echo in 11/19 (3 months after starting Herceptin).   Loralie Champagne 02/10/2018

## 2018-02-12 ENCOUNTER — Ambulatory Visit: Payer: Self-pay | Admitting: Surgery

## 2018-02-12 DIAGNOSIS — C50919 Malignant neoplasm of unspecified site of unspecified female breast: Secondary | ICD-10-CM

## 2018-02-14 ENCOUNTER — Encounter (HOSPITAL_BASED_OUTPATIENT_CLINIC_OR_DEPARTMENT_OTHER)
Admission: RE | Admit: 2018-02-14 | Discharge: 2018-02-14 | Disposition: A | Discharge status: Home / Self Care | Payer: Managed Care, Other (non HMO) | Source: Ambulatory Visit | Attending: Surgery | Admitting: Surgery

## 2018-02-14 LAB — CBC WITH DIFFERENTIAL/PLATELET
Abs Immature Granulocytes: 0 10*3/uL (ref 0.0–0.1)
Basophils Absolute: 0.1 10*3/uL (ref 0.0–0.1)
Basophils Relative: 1 %
Eosinophils Absolute: 0.1 10*3/uL (ref 0.0–0.7)
Eosinophils Relative: 1 %
HCT: 43.4 % (ref 36.0–46.0)
Hemoglobin: 14 g/dL (ref 12.0–15.0)
Immature Granulocytes: 0 %
Lymphocytes Relative: 20 %
Lymphs Abs: 1.5 10*3/uL (ref 0.7–4.0)
MCH: 30 pg (ref 26.0–34.0)
MCHC: 32.3 g/dL (ref 30.0–36.0)
MCV: 93.1 fL (ref 78.0–100.0)
Monocytes Absolute: 0.6 10*3/uL (ref 0.1–1.0)
Monocytes Relative: 8 %
Neutro Abs: 5.2 10*3/uL (ref 1.7–7.7)
Neutrophils Relative %: 70 %
Platelets: 299 10*3/uL (ref 150–400)
RBC: 4.66 MIL/uL (ref 3.87–5.11)
RDW: 11.9 % (ref 11.5–15.5)
WBC: 7.4 10*3/uL (ref 4.0–10.5)

## 2018-02-14 LAB — COMPREHENSIVE METABOLIC PANEL
ALT: 13 U/L (ref 0–44)
AST: 16 U/L (ref 15–41)
Albumin: 4.1 g/dL (ref 3.5–5.0)
Alkaline Phosphatase: 33 U/L — ABNORMAL LOW (ref 38–126)
Anion gap: 9 (ref 5–15)
BUN: 8 mg/dL (ref 6–20)
CO2: 28 mmol/L (ref 22–32)
Calcium: 9.7 mg/dL (ref 8.9–10.3)
Chloride: 102 mmol/L (ref 98–111)
Creatinine, Ser: 0.75 mg/dL (ref 0.44–1.00)
GFR calc Af Amer: >60 mL/min (ref >60)
GFR calc non Af Amer: >60 mL/min (ref >60)
Glucose, Bld: 85 mg/dL (ref 70–99)
Potassium: 4.1 mmol/L (ref 3.5–5.1)
Sodium: 139 mmol/L (ref 135–145)
Total Bilirubin: 1 mg/dL (ref 0.3–1.2)
Total Protein: 7 g/dL (ref 6.5–8.1)

## 2018-02-14 NOTE — Progress Notes (Signed)
CBC and BNP drawn. Pre surgical ensure given along with its drinking instructions. Pre surgical soap also given instructed on its use.

## 2018-02-15 ENCOUNTER — Ambulatory Visit
Admission: RE | Admit: 2018-02-15 | Discharge: 2018-02-15 | Disposition: A | Discharge status: Home / Self Care | Payer: Managed Care, Other (non HMO) | Source: Ambulatory Visit | Attending: Surgery | Admitting: Surgery

## 2018-02-15 ENCOUNTER — Inpatient Hospital Stay: Admission: RE | Admit: 2018-02-15 | Payer: Managed Care, Other (non HMO) | Source: Ambulatory Visit

## 2018-02-15 ENCOUNTER — Other Ambulatory Visit: Payer: Self-pay | Admitting: Surgery

## 2018-02-15 DIAGNOSIS — C50919 Malignant neoplasm of unspecified site of unspecified female breast: Secondary | ICD-10-CM

## 2018-02-16 ENCOUNTER — Ambulatory Visit
Admission: RE | Admit: 2018-02-16 | Discharge: 2018-02-16 | Disposition: A | Discharge status: Home / Self Care | Payer: Managed Care, Other (non HMO) | Source: Ambulatory Visit | Attending: Surgery | Admitting: Surgery

## 2018-02-16 ENCOUNTER — Ambulatory Visit (HOSPITAL_BASED_OUTPATIENT_CLINIC_OR_DEPARTMENT_OTHER): Payer: Managed Care, Other (non HMO) | Admitting: Anesthesiology

## 2018-02-16 ENCOUNTER — Encounter (HOSPITAL_BASED_OUTPATIENT_CLINIC_OR_DEPARTMENT_OTHER): Payer: Self-pay | Admitting: Certified Registered"

## 2018-02-16 ENCOUNTER — Encounter (HOSPITAL_BASED_OUTPATIENT_CLINIC_OR_DEPARTMENT_OTHER)
Admission: RE | Disposition: A | Discharge status: Home / Self Care | Payer: Self-pay | Source: Ambulatory Visit | Attending: Surgery

## 2018-02-16 ENCOUNTER — Ambulatory Visit (HOSPITAL_COMMUNITY): Payer: Managed Care, Other (non HMO)

## 2018-02-16 ENCOUNTER — Encounter (HOSPITAL_COMMUNITY)
Admission: RE | Admit: 2018-02-16 | Discharge: 2018-02-16 | Disposition: A | Discharge status: Home / Self Care | Payer: Managed Care, Other (non HMO) | Source: Ambulatory Visit | Attending: Surgery | Admitting: Surgery

## 2018-02-16 ENCOUNTER — Ambulatory Visit (HOSPITAL_BASED_OUTPATIENT_CLINIC_OR_DEPARTMENT_OTHER)
Admission: RE | Admit: 2018-02-16 | Discharge: 2018-02-16 | Disposition: A | Discharge status: Home / Self Care | Payer: Managed Care, Other (non HMO) | Source: Ambulatory Visit | Attending: Surgery | Admitting: Surgery

## 2018-02-16 ENCOUNTER — Other Ambulatory Visit: Payer: Self-pay

## 2018-02-16 DIAGNOSIS — C50911 Malignant neoplasm of unspecified site of right female breast: Secondary | ICD-10-CM

## 2018-02-16 DIAGNOSIS — C50411 Malignant neoplasm of upper-outer quadrant of right female breast: Secondary | ICD-10-CM | POA: Insufficient documentation

## 2018-02-16 DIAGNOSIS — Z419 Encounter for procedure for purposes other than remedying health state, unspecified: Secondary | ICD-10-CM

## 2018-02-16 DIAGNOSIS — Z95828 Presence of other vascular implants and grafts: Secondary | ICD-10-CM

## 2018-02-16 DIAGNOSIS — C50919 Malignant neoplasm of unspecified site of unspecified female breast: Secondary | ICD-10-CM

## 2018-02-16 HISTORY — PX: PORTACATH PLACEMENT: SHX2246

## 2018-02-16 HISTORY — PX: BREAST LUMPECTOMY WITH RADIOACTIVE SEED AND SENTINEL LYMPH NODE BIOPSY: SHX6550

## 2018-02-16 HISTORY — DX: Malignant (primary) neoplasm, unspecified: C80.1

## 2018-02-16 IMAGING — CR DG CHEST 1V PORT
1 series · 1 of 1 positions shown · non-contrast
Comparison: None.

CLINICAL DATA: Status post Port-A-Cath placement

EXAM:
PORTABLE CHEST 1 VIEW

[chest ap]
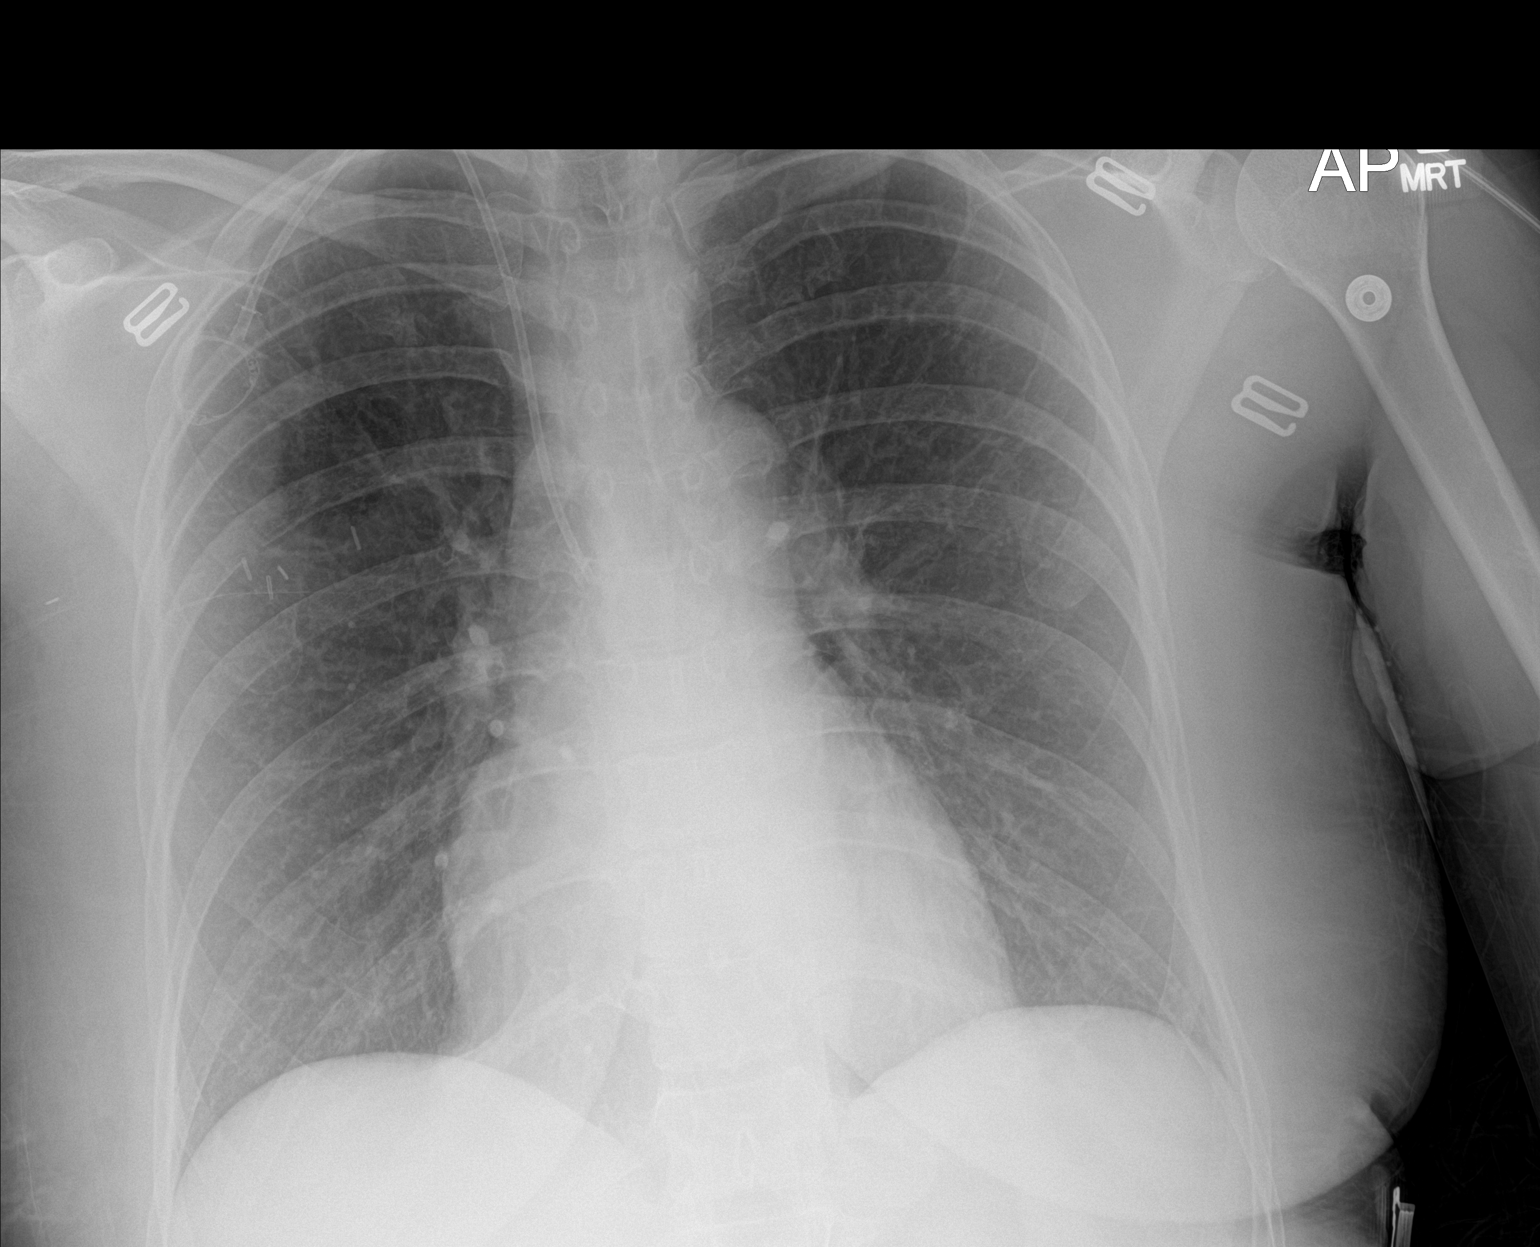

[1 of 1 positions shown; findings below may reference images not displayed]

FINDINGS: There is a right-sided Port-A-Cath with the tip projecting over the
SVC. There is no focal parenchymal opacity. There is no pleural
effusion or pneumothorax. The heart and mediastinal contours are
unremarkable.

The osseous structures are unremarkable.
IMPRESSION: Right-sided Port-A-Cath in satisfactory position.

## 2018-02-16 IMAGING — MG BREAST SURGICAL SPECIMEN
1 series · 1 of 1 positions shown · non-contrast
Comparison: Previous exam(s).

CLINICAL DATA: Evaluate surgical specimen following lumpectomy for
RIGHT breast cancer.

EXAM:
SPECIMEN RADIOGRAPH OF THE RIGHT BREAST

[R RIGHT]
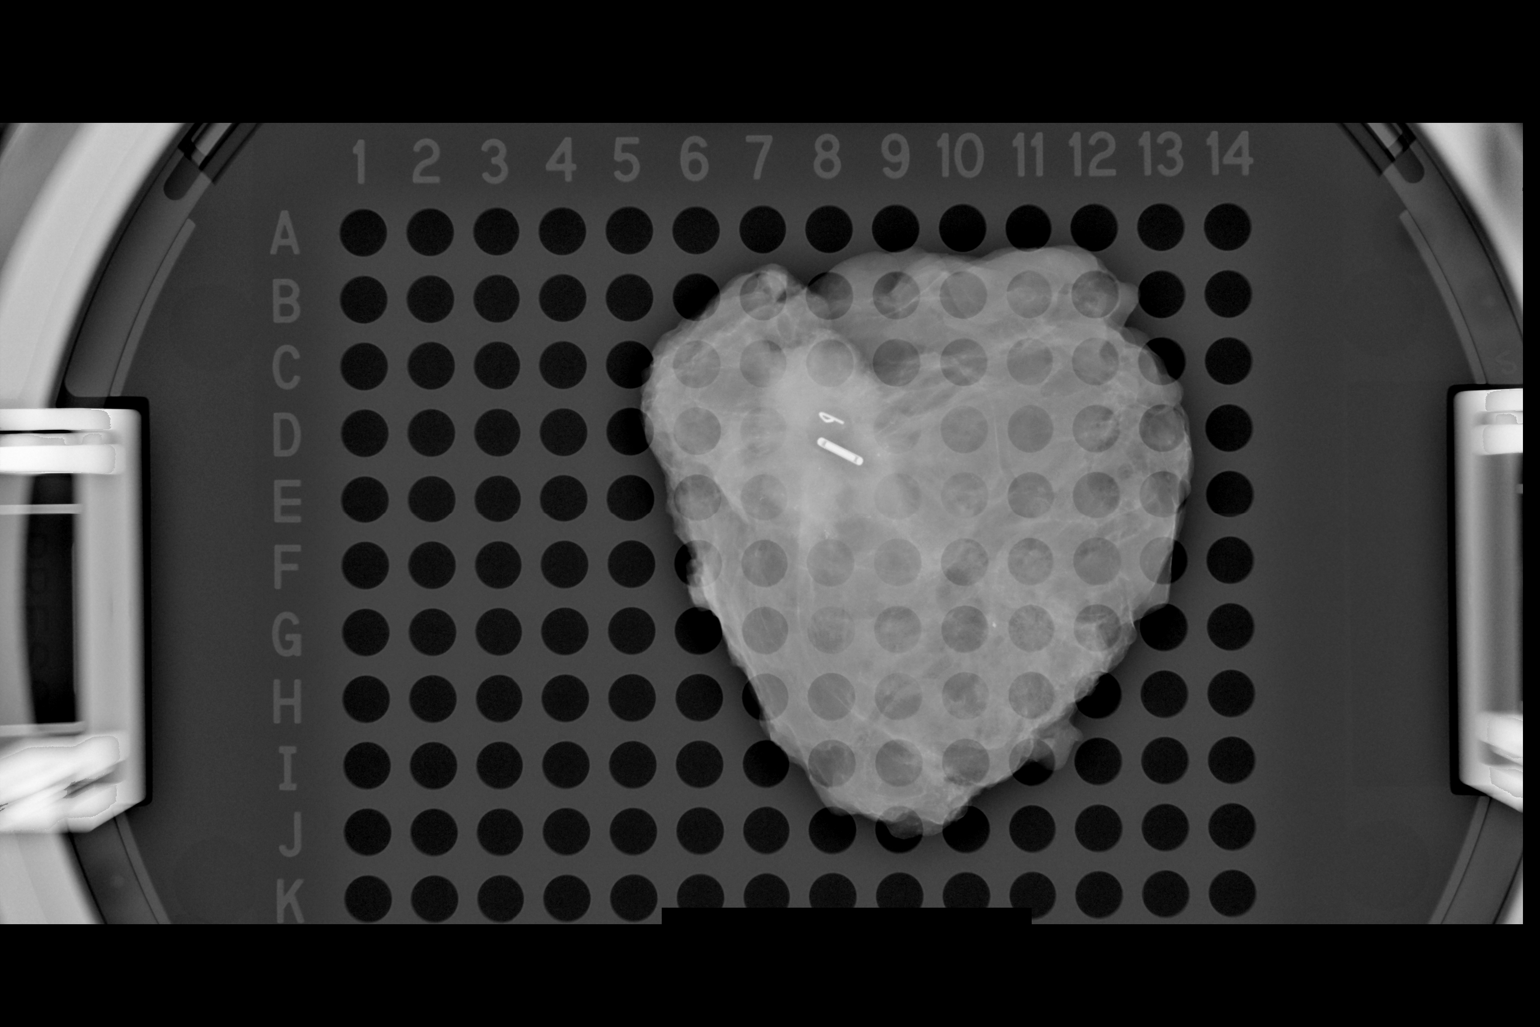

[1 of 1 positions shown; findings below may reference images not displayed]

FINDINGS: Status post excision of the RIGHT breast. The radioactive seed and
biopsy marker clip are present, completely intact, and were marked
for pathology.
IMPRESSION: Specimen radiograph of the RIGHT breast.

## 2018-02-16 SURGERY — INSERTION, TUNNELED CENTRAL VENOUS DEVICE, WITH PORT
Anesthesia: Regional | Site: Chest | Laterality: Right

## 2018-02-16 MED ORDER — TECHNETIUM TC 99M SULFUR COLLOID FILTERED
1.0000 | Freq: Once | INTRAVENOUS | Status: AC | PRN
  Administered 2018-02-16: 1 via INTRADERMAL

## 2018-02-16 MED ORDER — MIDAZOLAM HCL 2 MG/2ML IJ SOLN
INTRAMUSCULAR | Status: AC
  Filled 2018-02-16: qty 2

## 2018-02-16 MED ORDER — GABAPENTIN 300 MG PO CAPS
300.0000 mg | ORAL_CAPSULE | ORAL | Status: AC
  Administered 2018-02-16: 300 mg via ORAL

## 2018-02-16 MED ORDER — OXYCODONE HCL 5 MG PO TABS
5.0000 mg | ORAL_TABLET | Freq: Once | ORAL | Status: AC | PRN
  Administered 2018-02-16: 5 mg via ORAL

## 2018-02-16 MED ORDER — ROPIVACAINE HCL 5 MG/ML IJ SOLN
INTRAMUSCULAR | Status: DC | PRN
  Administered 2018-02-16: 30 mL via PERINEURAL

## 2018-02-16 MED ORDER — BUPIVACAINE-EPINEPHRINE (PF) 0.25% -1:200000 IJ SOLN
INTRAMUSCULAR | Status: AC
  Filled 2018-02-16: qty 30

## 2018-02-16 MED ORDER — PROPOFOL 500 MG/50ML IV EMUL
INTRAVENOUS | Status: AC
  Filled 2018-02-16: qty 50

## 2018-02-16 MED ORDER — PROPOFOL 10 MG/ML IV BOLUS
INTRAVENOUS | Status: DC | PRN
  Administered 2018-02-16: 200 mg via INTRAVENOUS

## 2018-02-16 MED ORDER — SODIUM CHLORIDE 0.9 % IJ SOLN
INTRAMUSCULAR | Status: AC
  Filled 2018-02-16: qty 10

## 2018-02-16 MED ORDER — EPHEDRINE SULFATE 50 MG/ML IJ SOLN
INTRAMUSCULAR | Status: DC | PRN
  Administered 2018-02-16 (×2): 10 mg via INTRAVENOUS

## 2018-02-16 MED ORDER — LIDOCAINE 2% (20 MG/ML) 5 ML SYRINGE
INTRAMUSCULAR | Status: AC
  Filled 2018-02-16: qty 5

## 2018-02-16 MED ORDER — ACETAMINOPHEN 500 MG PO TABS
1000.0000 mg | ORAL_TABLET | ORAL | Status: AC
  Administered 2018-02-16: 1000 mg via ORAL

## 2018-02-16 MED ORDER — OXYCODONE HCL 5 MG/5ML PO SOLN: 5.0000 mg | Freq: Once | ORAL | Status: AC | PRN

## 2018-02-16 MED ORDER — IOPAMIDOL (ISOVUE-300) INJECTION 61%
INTRAVENOUS | Status: AC
  Filled 2018-02-16: qty 50

## 2018-02-16 MED ORDER — FENTANYL CITRATE (PF) 100 MCG/2ML IJ SOLN
INTRAMUSCULAR | Status: AC
  Filled 2018-02-16: qty 2

## 2018-02-16 MED ORDER — SCOPOLAMINE 1 MG/3DAYS TD PT72
1.0000 | MEDICATED_PATCH | Freq: Once | TRANSDERMAL | Status: AC | PRN
  Administered 2018-02-16: 1 via TRANSDERMAL

## 2018-02-16 MED ORDER — METHYLENE BLUE 0.5 % INJ SOLN
INTRAVENOUS | Status: AC
  Filled 2018-02-16: qty 10

## 2018-02-16 MED ORDER — CHLORHEXIDINE GLUCONATE CLOTH 2 % EX PADS: 6.0000 | MEDICATED_PAD | Freq: Once | CUTANEOUS | Status: DC

## 2018-02-16 MED ORDER — HEPARIN SOD (PORK) LOCK FLUSH 100 UNIT/ML IV SOLN
INTRAVENOUS | Status: AC
  Filled 2018-02-16: qty 5

## 2018-02-16 MED ORDER — ONDANSETRON HCL 4 MG/2ML IJ SOLN
INTRAMUSCULAR | Status: DC | PRN
  Administered 2018-02-16: 4 mg via INTRAVENOUS

## 2018-02-16 MED ORDER — LACTATED RINGERS IV SOLN
INTRAVENOUS | Status: DC
  Administered 2018-02-16 (×2): via INTRAVENOUS

## 2018-02-16 MED ORDER — PROPOFOL 500 MG/50ML IV EMUL
INTRAVENOUS | Status: DC | PRN
  Administered 2018-02-16: 75 ug/kg/min via INTRAVENOUS

## 2018-02-16 MED ORDER — ONDANSETRON HCL 4 MG/2ML IJ SOLN
INTRAMUSCULAR | Status: AC
Start: 2018-02-16 — End: ?
  Filled 2018-02-16: qty 2

## 2018-02-16 MED ORDER — OXYCODONE HCL 5 MG PO TABS: 5.0000 mg | ORAL_TABLET | Freq: Four times a day (QID) | ORAL | 0 refills | Status: DC | PRN

## 2018-02-16 MED ORDER — SCOPOLAMINE 1 MG/3DAYS TD PT72
MEDICATED_PATCH | TRANSDERMAL | Status: AC
  Filled 2018-02-16: qty 1

## 2018-02-16 MED ORDER — HEPARIN (PORCINE) IN NACL 1000-0.9 UT/500ML-% IV SOLN
INTRAVENOUS | Status: AC
  Filled 2018-02-16: qty 500

## 2018-02-16 MED ORDER — IBUPROFEN 800 MG PO TABS: 800.0000 mg | ORAL_TABLET | Freq: Three times a day (TID) | ORAL | 0 refills | Status: DC | PRN

## 2018-02-16 MED ORDER — DEXAMETHASONE SODIUM PHOSPHATE 4 MG/ML IJ SOLN
INTRAMUSCULAR | Status: DC | PRN
  Administered 2018-02-16: 10 mg via INTRAVENOUS

## 2018-02-16 MED ORDER — PROMETHAZINE HCL 25 MG/ML IJ SOLN: 6.2500 mg | INTRAMUSCULAR | Status: DC | PRN

## 2018-02-16 MED ORDER — HEPARIN (PORCINE) IN NACL 2-0.9 UNITS/ML
INTRAMUSCULAR | Status: AC | PRN
  Administered 2018-02-16: 1 via INTRAVENOUS

## 2018-02-16 MED ORDER — CEFAZOLIN SODIUM-DEXTROSE 2-4 GM/100ML-% IV SOLN
2.0000 g | INTRAVENOUS | Status: AC
  Administered 2018-02-16: 2 g via INTRAVENOUS

## 2018-02-16 MED ORDER — DEXAMETHASONE SODIUM PHOSPHATE 10 MG/ML IJ SOLN
INTRAMUSCULAR | Status: AC
  Filled 2018-02-16: qty 1

## 2018-02-16 MED ORDER — FENTANYL CITRATE (PF) 100 MCG/2ML IJ SOLN
50.0000 ug | INTRAMUSCULAR | Status: AC | PRN
  Administered 2018-02-16: 100 ug via INTRAVENOUS
  Administered 2018-02-16 (×2): 25 ug via INTRAVENOUS
  Administered 2018-02-16: 50 ug via INTRAVENOUS

## 2018-02-16 MED ORDER — MIDAZOLAM HCL 2 MG/2ML IJ SOLN
1.0000 mg | INTRAMUSCULAR | Status: DC | PRN
  Administered 2018-02-16: 2 mg via INTRAVENOUS

## 2018-02-16 MED ORDER — GABAPENTIN 300 MG PO CAPS
ORAL_CAPSULE | ORAL | Status: AC
  Filled 2018-02-16: qty 1

## 2018-02-16 MED ORDER — ACETAMINOPHEN 500 MG PO TABS
ORAL_TABLET | ORAL | Status: AC
Start: 2018-02-16 — End: ?
  Filled 2018-02-16: qty 2

## 2018-02-16 MED ORDER — LIDOCAINE HCL (CARDIAC) PF 100 MG/5ML IV SOSY
PREFILLED_SYRINGE | INTRAVENOUS | Status: DC | PRN
  Administered 2018-02-16: 30 mg via INTRAVENOUS

## 2018-02-16 MED ORDER — CELECOXIB 400 MG PO CAPS
400.0000 mg | ORAL_CAPSULE | ORAL | Status: AC
  Administered 2018-02-16: 400 mg via ORAL

## 2018-02-16 MED ORDER — HYDROMORPHONE HCL 1 MG/ML IJ SOLN: 0.2500 mg | INTRAMUSCULAR | Status: DC | PRN

## 2018-02-16 MED ORDER — PROPOFOL 10 MG/ML IV BOLUS
INTRAVENOUS | Status: AC
  Filled 2018-02-16: qty 20

## 2018-02-16 MED ORDER — CEFAZOLIN SODIUM-DEXTROSE 2-4 GM/100ML-% IV SOLN
INTRAVENOUS | Status: AC
  Filled 2018-02-16: qty 100

## 2018-02-16 MED ORDER — OXYCODONE HCL 5 MG PO TABS
ORAL_TABLET | ORAL | Status: AC
  Filled 2018-02-16: qty 1

## 2018-02-16 MED ORDER — MEPERIDINE HCL 25 MG/ML IJ SOLN: 6.2500 mg | INTRAMUSCULAR | Status: DC | PRN

## 2018-02-16 MED ORDER — HEPARIN SOD (PORK) LOCK FLUSH 100 UNIT/ML IV SOLN
INTRAVENOUS | Status: DC | PRN
  Administered 2018-02-16: 1000 [IU]

## 2018-02-16 MED ORDER — CELECOXIB 200 MG PO CAPS
ORAL_CAPSULE | ORAL | Status: AC
  Filled 2018-02-16: qty 2

## 2018-02-16 MED ORDER — BUPIVACAINE-EPINEPHRINE 0.25% -1:200000 IJ SOLN
INTRAMUSCULAR | Status: DC | PRN
  Administered 2018-02-16: 40 mL

## 2018-02-16 SURGICAL SUPPLY — 72 items
APPLIER CLIP 9.375 MED OPEN (MISCELLANEOUS) ×3
BAG DECANTER FOR FLEXI CONT (MISCELLANEOUS) ×3 IMPLANT
BENZOIN TINCTURE PRP APPL 2/3 (GAUZE/BANDAGES/DRESSINGS) IMPLANT
BINDER BREAST LRG (GAUZE/BANDAGES/DRESSINGS) IMPLANT
BINDER BREAST MEDIUM (GAUZE/BANDAGES/DRESSINGS) IMPLANT
BINDER BREAST XLRG (GAUZE/BANDAGES/DRESSINGS) ×3 IMPLANT
BINDER BREAST XXLRG (GAUZE/BANDAGES/DRESSINGS) IMPLANT
BLADE HEX COATED 2.75 (ELECTRODE) IMPLANT
BLADE SURG 11 STRL SS (BLADE) ×3 IMPLANT
BLADE SURG 15 STRL LF DISP TIS (BLADE) ×2 IMPLANT
BLADE SURG 15 STRL SS (BLADE) ×1
CANISTER SUC SOCK COL 7IN (MISCELLANEOUS) IMPLANT
CANISTER SUCT 1200ML W/VALVE (MISCELLANEOUS) ×3 IMPLANT
CHLORAPREP W/TINT 26ML (MISCELLANEOUS) ×3 IMPLANT
CLIP APPLIE 9.375 MED OPEN (MISCELLANEOUS) ×2 IMPLANT
COVER BACK TABLE 60X90IN (DRAPES) ×3 IMPLANT
COVER MAYO STAND STRL (DRAPES) ×3 IMPLANT
COVER PROBE 5X48 (MISCELLANEOUS) ×1
COVER PROBE W GEL 5X96 (DRAPES) ×3 IMPLANT
DECANTER SPIKE VIAL GLASS SM (MISCELLANEOUS) IMPLANT
DERMABOND ADVANCED (GAUZE/BANDAGES/DRESSINGS) ×2
DERMABOND ADVANCED .7 DNX12 (GAUZE/BANDAGES/DRESSINGS) ×4 IMPLANT
DEVICE DUBIN W/COMP PLATE 8390 (MISCELLANEOUS) ×3 IMPLANT
DRAPE C-ARM 42X72 X-RAY (DRAPES) ×3 IMPLANT
DRAPE LAPAROSCOPIC ABDOMINAL (DRAPES) ×3 IMPLANT
DRAPE UTILITY XL STRL (DRAPES) ×6 IMPLANT
DRSG TEGADERM 2-3/8X2-3/4 SM (GAUZE/BANDAGES/DRESSINGS) IMPLANT
ELECT COATED BLADE 2.86 ST (ELECTRODE) ×3 IMPLANT
ELECT REM PT RETURN 9FT ADLT (ELECTROSURGICAL) ×3
ELECTRODE REM PT RTRN 9FT ADLT (ELECTROSURGICAL) ×2 IMPLANT
GAUZE SPONGE 4X4 12PLY STRL LF (GAUZE/BANDAGES/DRESSINGS) IMPLANT
GLOVE BIOGEL PI IND STRL 7.0 (GLOVE) ×4 IMPLANT
GLOVE BIOGEL PI IND STRL 8 (GLOVE) ×2 IMPLANT
GLOVE BIOGEL PI INDICATOR 7.0 (GLOVE) ×2
GLOVE BIOGEL PI INDICATOR 8 (GLOVE) ×1
GLOVE ECLIPSE 8.0 STRL XLNG CF (GLOVE) ×3 IMPLANT
GLOVE SURG SYN 7.5  E (GLOVE) ×1
GLOVE SURG SYN 7.5 E (GLOVE) ×2 IMPLANT
GOWN STRL REUS W/ TWL LRG LVL3 (GOWN DISPOSABLE) ×2 IMPLANT
GOWN STRL REUS W/TWL LRG LVL3 (GOWN DISPOSABLE) ×1
HEMOSTAT ARISTA ABSORB 3G PWDR (MISCELLANEOUS) IMPLANT
HEMOSTAT SNOW SURGICEL 2X4 (HEMOSTASIS) ×3 IMPLANT
IV KIT MINILOC 20X1 SAFETY (NEEDLE) IMPLANT
KIT CVR 48X5XPRB PLUP LF (MISCELLANEOUS) ×2 IMPLANT
KIT MARKER MARGIN INK (KITS) ×3 IMPLANT
KIT PORT POWER 8FR ISP CVUE (Port) ×3 IMPLANT
NDL SAFETY ECLIPSE 18X1.5 (NEEDLE) IMPLANT
NEEDLE HYPO 18GX1.5 SHARP (NEEDLE)
NEEDLE HYPO 22GX1.5 SAFETY (NEEDLE) IMPLANT
NEEDLE HYPO 25X1 1.5 SAFETY (NEEDLE) ×3 IMPLANT
NEEDLE SPNL 22GX3.5 QUINCKE BK (NEEDLE) IMPLANT
NS IRRIG 1000ML POUR BTL (IV SOLUTION) ×3 IMPLANT
PACK BASIN DAY SURGERY FS (CUSTOM PROCEDURE TRAY) ×3 IMPLANT
PENCIL BUTTON HOLSTER BLD 10FT (ELECTRODE) ×3 IMPLANT
SET SHEATH INTRODUCER 10FR (MISCELLANEOUS) IMPLANT
SHEATH COOK PEEL AWAY SET 9F (SHEATH) IMPLANT
SLEEVE SCD COMPRESS KNEE MED (MISCELLANEOUS) ×3 IMPLANT
SPONGE LAP 4X18 RFD (DISPOSABLE) ×3 IMPLANT
STRIP CLOSURE SKIN 1/2X4 (GAUZE/BANDAGES/DRESSINGS) IMPLANT
SUT MNCRL AB 4-0 PS2 18 (SUTURE) ×6 IMPLANT
SUT MON AB 4-0 PC3 18 (SUTURE) ×3 IMPLANT
SUT PROLENE 2 0 CT2 30 (SUTURE) IMPLANT
SUT PROLENE 2 0 SH DA (SUTURE) ×3 IMPLANT
SUT SILK 2 0 TIES 17X18 (SUTURE)
SUT SILK 2-0 18XBRD TIE BLK (SUTURE) IMPLANT
SUT VICRYL 3-0 CR8 SH (SUTURE) ×6 IMPLANT
SYR 5ML LUER SLIP (SYRINGE) ×3 IMPLANT
SYR CONTROL 10ML LL (SYRINGE) ×3 IMPLANT
TOWEL GREEN STERILE FF (TOWEL DISPOSABLE) ×3 IMPLANT
TOWEL OR NON WOVEN STRL DISP B (DISPOSABLE) IMPLANT
TUBE CONNECTING 20X1/4 (TUBING) ×3 IMPLANT
YANKAUER SUCT BULB TIP NO VENT (SUCTIONS) ×3 IMPLANT

## 2018-02-16 NOTE — Discharge Instructions (Signed)
[facility] Office Phone Number [phone]  BREAST BIOPSY/ LUMPECTOMY: POST OP INSTRUCTIONS  Always review your discharge instruction sheet given to you by the facility where your surgery was performed.  IF YOU HAVE DISABILITY OR FAMILY LEAVE FORMS, YOU MUST BRING THEM TO THE OFFICE FOR PROCESSING.  DO NOT GIVE THEM TO YOUR DOCTOR.  1. A prescription for pain medication may be given to you upon discharge.  Take your pain medication as prescribed, if needed.  If narcotic pain medicine is not needed, then you may take acetaminophen (Tylenol) or ibuprofen (Advil) as needed. 1,000mg  of Tylenol given at 9:30am 2. Take your usually prescribed medications unless otherwise directed 3. If you need a refill on your pain medication, please contact your pharmacy.  They will contact our office to request authorization.  Prescriptions will not be filled after 5pm or on week-ends. 4. You should eat very light the first 24 hours after surgery, such as soup, crackers, pudding, etc.  Resume your normal diet the day after surgery. 5. Most patients will experience some swelling and bruising in the breast.  Ice packs and a good support bra will help.  Swelling and bruising can take several days to resolve.  6. It is common to experience some constipation if taking pain medication after surgery.  Increasing fluid intake and taking a stool softener will usually help or prevent this problem from occurring.  A mild laxative (Milk of Magnesia or Miralax) should be taken according to package directions if there are no bowel movements after 48 hours. 7. Unless discharge instructions indicate otherwise, you may remove your bandages 24-48 hours after surgery, and you may shower at that time.  You may have steri-strips (small skin tapes) in place directly over the incision.  These strips should be left on the skin for 7-10 days.  If your surgeon used skin glue on the incision, you may shower in 24 hours.  The glue  will flake off over the next 2-3 weeks.  Any sutures or staples will be removed at the office during your follow-up visit. 8. ACTIVITIES:  You may resume regular daily activities (gradually increasing) beginning the next day.  Wearing a good support bra or sports bra minimizes pain and swelling.  You may have sexual intercourse when it is comfortable. a. You may drive when you no longer are taking prescription pain medication, you can comfortably wear a seatbelt, and you can safely maneuver your car and apply brakes. b. RETURN TO WORK:  ______________________________________________________________________________________ 9. You should see your doctor in the office for a follow-up appointment approximately two weeks after your surgery.  Your doctors nurse will typically make your follow-up appointment when she calls you with your pathology report.  Expect your pathology report 2-3 business days after your surgery.  You may call to check if you do not hear from [place] after three days. 10. OTHER INSTRUCTIONS: _______________________________________________________________________________________________ _____________________________________________________________________________________________________________________________________ _____________________________________________________________________________________________________________________________________ _____________________________________________________________________________________________________________________________________  WHEN TO CALL YOUR DOCTOR: 1. Fever over 101.0 2. Nausea and/or vomiting. 3. Extreme swelling or bruising. 4. Continued bleeding from incision. 5. Increased pain, redness, or drainage from the incision.  The clinic staff is available to answer your questions during regular business hours.  Please dont hesitate to call and ask to speak to one of the nurses for clinical concerns.  If you have a medical  emergency, go to the nearest emergency room or call 911.  A surgeon from [facility] is always on call at the hospital.  For further  questions, please visit [email or web address]    Post Anesthesia Home Care Instructions  Activity: Get plenty of rest for the remainder of the day. A responsible individual must stay with you for 24 hours following the procedure.  For the next 24 hours, DO NOT: -Drive a car -Paediatric nurse -Drink alcoholic beverages -Take any medication unless instructed by your physician -Make any legal decisions or sign important papers.  Meals: Start with liquid foods such as gelatin or soup. Progress to regular foods as tolerated. Avoid greasy, spicy, heavy foods. If nausea and/or vomiting occur, drink only clear liquids until the nausea and/or vomiting subsides. Call your physician if vomiting continues.  Special Instructions/Symptoms: Your throat may feel dry or sore from the anesthesia or the breathing tube placed in your throat during surgery. If this causes discomfort, gargle with warm salt water. The discomfort should disappear within 24 hours.  If you had a scopolamine patch placed behind your ear for the management of post- operative nausea and/or vomiting:  1. The medication in the patch is effective for 72 hours, after which it should be removed.  Wrap patch in a tissue and discard in the trash. Wash hands thoroughly with soap and water. 2. You may remove the patch earlier than 72 hours if you experience unpleasant side effects which may include dry mouth, dizziness or visual disturbances. 3. Avoid touching the patch. Wash your hands with soap and water after contact with the patch.

## 2018-02-16 NOTE — H&P (Signed)
Monica [name]  Location: [facility] Patient #: [id number] DOB: [birth year] Undefined / Language: [name] / Race: White Female  History of Present Illness  Patient words: Pt sent at the request of Dr [name] for 2 month hx of palpable right upper breast mass. It is not painful, drainig or red. It is getteing larger. 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 No other complaints.  The patient is a 47 year old female.   Past Surgical History  Breast Biopsy Right. Hysterectomy (not due to cancer) - Partial  Diagnostic Studies History  Colonoscopy never Mammogram within last year Pap Smear 1-5 years ago  Medication History  Medications Reconciled  Social History Alcohol use Occasional alcohol use. Caffeine use Carbonated beverages, Coffee, Tea. No drug use Tobacco use Never smoker.  Family History  Arthritis Family Members In General. Breast Cancer Family Members In General. Cancer Family Members In General, Father. Hypertension Family Members In General. Melanoma Mother. Migraine Headache Daughter, Family Members In General, Sister. Respiratory Condition Family Members In General. Thyroid problems Family Members In General, Sister.  Pregnancy / Birth History  Age at menarche 38 years. Contraceptive History Oral contraceptives. Gravida 2 Maternal age 12-30 Para 2 Regular periods  Other Problems  Back Pain Lump In Breast Transfusion history     Review of Systems  General Present- Night Sweats and Weight Loss. Not Present- Appetite Loss, Chills, Fatigue, Fever and Weight Gain. Skin Present- Dryness. Not Present- Change in Wart/Mole, Hives, Jaundice, New Lesions, Non-Healing Wounds, Rash and Ulcer. HEENT Present- Seasonal Allergies and Wears glasses/contact lenses. Not Present- Earache, Hearing Loss, Hoarseness, Nose Bleed, Oral Ulcers, Ringing in the Ears, Sinus Pain,  Sore Throat, Visual Disturbances and Yellow Eyes. Respiratory Present- Snoring. Not Present- Bloody sputum, Chronic Cough, Difficulty Breathing and Wheezing. Breast Not Present- Breast Mass, Breast Pain, Nipple Discharge and Skin Changes. Cardiovascular Not Present- Chest Pain, Difficulty Breathing Lying Down, Leg Cramps, Palpitations, Rapid Heart Rate, Shortness of Breath and Swelling of Extremities. Gastrointestinal Present- Constipation. Not Present- Abdominal Pain, Bloating, Bloody Stool, Change in Bowel Habits, Chronic diarrhea, Difficulty Swallowing, Excessive gas, Gets full quickly at meals, Hemorrhoids, Indigestion, Nausea, Rectal Pain and Vomiting. Female Genitourinary Not Present- Frequency, Nocturia, Painful Urination, Pelvic Pain and Urgency. Musculoskeletal Not Present- Back Pain, Joint Pain, Joint Stiffness, Muscle Pain, Muscle Weakness and Swelling of Extremities. Neurological Not Present- Decreased Memory, Fainting, Headaches, Numbness, Seizures, Tingling, Tremor, Trouble walking and Weakness. Psychiatric Not Present- Anxiety, Bipolar, Change in Sleep Pattern, Depression, Fearful and Frequent crying. Endocrine Present- Cold Intolerance. Not Present- Excessive Hunger, Hair Changes, Heat Intolerance, Hot flashes and New Diabetes. Hematology Present- Easy Bruising and Excessive bleeding. Not Present- Blood Thinners, Gland problems, HIV and Persistent Infections.   Physical Exam   General Mental Status-Alert. General Appearance-Consistent with stated age. Hydration-Well hydrated. Voice-Normal.  Head and Neck Head-normocephalic, atraumatic with no lesions or palpable masses. Trachea-midline. Thyroid Gland Characteristics - normal size and consistency.  Eye Eyeball - Bilateral-Extraocular movements intact. Sclera/Conjunctiva - Bilateral-No scleral icterus.  Chest and Lung Exam Chest and lung exam reveals -quiet, even and easy respiratory effort with  no use of accessory muscles and on auscultation, normal breath sounds, no adventitious sounds and normal vocal resonance. Inspection Chest Wall - Normal. Back - normal.  Breast Note: 1 cm mass upper right breast mobile left breast normal  Cardiovascular Cardiovascular examination reveals -normal heart sounds, regular rate and rhythm with no murmurs and normal pedal pulses  bilaterally.  Neurologic Neurologic evaluation reveals -alert and oriented x 3 with no impairment of recent or remote memory. Mental Status-Normal.  Lymphatic Head & Neck  General Head & Neck Lymphatics: Bilateral - Description - Normal. Axillary  General Axillary Region: Bilateral - Description - Normal. Tenderness - Non Tender.    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 Pt agrees to proceed. Risk of sentinel lymph node mapping include bleeding, infection, lymphedema, shoulder pain. stiffness, dye allergy. cosmetic deformity , blood clots, death, need for more surgery. Pt agres to proceed. Pt requires port placement for chemotherapy. Risk include bleeding, infection, pneumothorax, hemothorax, mediastinal injury, nerve injury , blood vessel injury, strke, blood clots, death, migration. embolization and need for additional procedures. Pt agrees to proceed.  Current Plans You are being scheduled for surgery- Our schedulers will call you.  You should hear from our office's scheduling department within 5 working days about the location, date, and time of surgery. We try to make accommodations for patient's preferences in scheduling surgery, but sometimes the OR schedule or the  surgeon's schedule prevents [place] from making those accommodations.  If you have not heard from our office [phone]) in 5 working days, call the office and ask for your surgeon's nurse.  If you have other questions about your diagnosis, plan, or surgery, call the office and ask for your surgeon's nurse.  Pt Education - [facility] Breast Cancer Information Given - [facility] "Breast Journey" Package Pt Education - Pamphlet Given - Breast Biopsy: discussed with patient and provided information. We discussed the staging and pathophysiology of breast cancer. We discussed all of the different options for treatment for breast cancer including surgery, chemotherapy, radiation therapy, Herceptin, and antiestrogen therapy. We discussed a sentinel lymph node biopsy as she does not appear to having lymph node involvement right now. We discussed the performance of that with injection of radioactive tracer and blue dye. We discussed that she would have an incision underneath her axillary hairline. We discussed that there is a bout a 10-20% chance of having a positive node with a sentinel lymph node biopsy and we will await the permanent pathology to make any other first further decisions in terms of her treatment. One of these options might be to return to the operating room to perform an axillary lymph node dissection. We discussed about a 1-2% risk lifetime of chronic shoulder pain as well as lymphedema associated with a sentinel lymph node biopsy. We discussed the options for treatment of the breast cancer which included lumpectomy versus a mastectomy. We discussed the performance of the lumpectomy with a wire placement. We discussed a 10-20% chance of a positive margin requiring reexcision in the operating room. We also discussed that she may need radiation therapy or antiestrogen therapy or both if she undergoes lumpectomy. We discussed the mastectomy and the postoperative care for that as well. We discussed that there  is no difference in her survival whether she undergoes lumpectomy with radiation therapy or antiestrogen therapy versus a mastectomy. There is a slight difference in the local recurrence rate being 3-5% with lumpectomy and about 1% with a mastectomy. We discussed the risks of operation including bleeding, infection, possible reoperation. She understands her further therapy will be based on  what her stages at the time of her operation.

## 2018-02-16 NOTE — Progress Notes (Signed)
nuc med inj performed by nuc med staff. Pt tol well with no additional sedation. Will call mom to bedside and update/provide emotional support.

## 2018-02-16 NOTE — Progress Notes (Signed)
Assisted Dr. Miller with right, ultrasound guided, pectoralis block. Side rails up, monitors on throughout procedure. See vital signs in flow sheet. Tolerated Procedure well. 

## 2018-02-16 NOTE — Transfer of Care (Signed)
Immediate Anesthesia Transfer of Care Note  Patient: Monica [name]  Procedure(s) Performed: INSERTION PORT-A-CATH (Right Chest) RIGHT BREAST LUMPECTOMY WITH RADIOACTIVE SEED AND SENTINEL LYMPH NODE BIOPSY (Right Breast)  Patient Location: PACU  Anesthesia Type:GA combined with regional for post-op pain  Level of Consciousness: awake and patient cooperative  Airway & Oxygen Therapy: Patient Spontanous Breathing and Patient connected to face mask oxygen  Post-op Assessment: Report given to RN and Post -op Vital signs reviewed and stable  Post vital signs: Reviewed and stable  Last Vitals:  Vitals Value Taken Time  BP    Temp    Pulse    Resp    SpO2      Last Pain:  Vitals:   02/16/18 0926  TempSrc: Oral  PainSc: 0-No pain         Complications: No apparent anesthesia complications

## 2018-02-16 NOTE — Op Note (Signed)
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  Deep margins pectoralis fascia.  For right axillary sentinel nodes hot  Drains: None  IV fluids: Per anesthesia record  Indications for procedure: Patient presents for breast conservation surgery after being seen at the [facility] for stage II right breast cancer.  She will require postoperative chemotherapy and opted for breast conservation.The procedure has been discussed with the patient. Alternatives to surgery have been discussed with the patient.  Risks of surgery include bleeding,  Infection,  Seroma formation, death,  and the need for further surgery.   The patient understands and wishes to proceed.Sentinel lymph node mapping and dissection has been discussed with the patient.  Risk of bleeding,  Infection,  Seroma formation,  Additional procedures,,  Shoulder weakness ,  Shoulder stiffness,  Nerve and blood vessel injury and reaction to the mapping dyes have been discussed.  Alternatives to surgery have been discussed with the patient.  The patient agrees to proceed. Port placement discussed.  Risk and benefits discussed.The procedure has been discussed with the patient.  Alternative therapies have been discussed with the patient.  Operative risks include bleeding,  Infection,  Organ injury,  Nerve injury,  Blood vessel injury,  DVT,  Pulmonary embolism,  Death,  And possible reoperation.  Medical management risks include worsening of present  situation.  The success of the procedure is 50 -90 % at treating patients symptoms.  The patient understands and agrees to proceed.        Description of procedure: The patient was met in the holding area.  Questions were answered and the right brought breast was marked as the correct side.  Neoprobe was used to verify seed location in the central upper right breast.  She underwent block by anesthesia for pain control and injection by nuclear medicine.  All questions were answered.  She was taken back to the operative room.  She was placed supine upon the operating room table.  After induction of LMA anesthesia, the right breast and upper chest and neck regions were prepped and draped in sterile time out was done.  The Port-A-Cath was placed first.  Ultrasound was used to verify location of the right internal jugular vein.  Needle was used to access this without difficulty and return of dark nonpulsatile blood noted.  The wire fed easily into the needle.  C arm used to verify wire going down the superior vena cava.  Small skin incision made at the wire insertion site.  Local anesthetic infiltrated along the right upper chest.  Transverse incision made in small pocket created just below the clavicle.  The port was brought to the field assembled and flushed easily.  It was tunneled from the lower incision to the upper incision.  The patient was in Trendelenburg during the entire procedure.  I then passed the dilator introducer complex of the wire removing the wire to and fro without resistance.  I removed the dilator entered and wire.  The catheter was fed to the introducer and the peel-away sheath  the peel-away without difficulty.  The tip the catheter was visualized by C arm and appeared to be in the distal superior vena cava.  This was flushed and drew back easily.  There is no resistance.   Consentrated Heparinized saline was placed into the Port-A-Cath.  The tip appeared to be in the superior vena cava by  C arm.  A single stitch of #1 Prolene used to secure the port to the chest wall.  Both incisions closed with 3-0 Vicryl and 4-0 Monocryl.  Of note there was no kinking of the catheter.  The lumpectomy was performed next.  Neoprobe used to identify the seed location in the central upper right breast.  Transverse incision made over this.  All tissue around the tumor which is palpable were excised.  The deep margin was the pectoralis fashion and this was removed with the tumor.  I shaved all margins except the deep margin due to the fact this was packed muscle.  These were sent separately.  Faxitron used to verify location of seed and clip within the mass.  This was sent to pathology and verified.  All gross margins were negative upon inspection.  The cavity was made hemostatic.  It was closed with 3-0 Vicryl and 4-0 Monocryl.  Of note clips were placed prior to closing.  The sentinel node was done next.  Neoprobe used and hot spot identified right axilla.  A 4 cm incision was made in the right axilla and dissection was carried down into the deep right x-ray contents.  Therefore nodes removed were hot.  Background counts approaches 0.  The long thoracic nerve thoracodorsal trunk and axillary vein were all preserved.  Hemostasis achieved.  Wound closed with 3-0 Vicryl  4 O  Monocryl.  Dermabond applied.  All final counts found to be correct.  Breast binder placed.  The patient was extubated taken recovery in satisfactory condition.  X-ray for PACU to check line placement.

## 2018-02-16 NOTE — Anesthesia Postprocedure Evaluation (Signed)
Anesthesia Post Note  Patient: Art gallery manager  Procedure(s) Performed: INSERTION PORT-A-CATH (Right Chest) RIGHT BREAST LUMPECTOMY WITH RADIOACTIVE SEED AND SENTINEL LYMPH NODE BIOPSY (Right Breast)     Patient location during evaluation: PACU Anesthesia Type: General Level of consciousness: awake and alert Pain management: pain level controlled Vital Signs Assessment: post-procedure vital signs reviewed and stable Respiratory status: spontaneous breathing, nonlabored ventilation and respiratory function stable Cardiovascular status: blood pressure returned to baseline and stable Postop Assessment: no apparent nausea or vomiting Anesthetic complications: no    Last Vitals:  Vitals:   02/16/18 1315 02/16/18 1400  BP: 115/73 124/80  Pulse: 71 74  Resp: 14 16  Temp:  36.8 C  SpO2: 99% 98%    Last Pain:  Vitals:   02/16/18 1400  TempSrc:   PainSc: 3                  Lynda Rainwater

## 2018-02-16 NOTE — Anesthesia Preprocedure Evaluation (Signed)
Anesthesia Evaluation  Patient identified by MRN, date of birth, ID band Patient awake    Reviewed: Allergy & Precautions, NPO status , Patient's Chart, lab work & pertinent test results  Airway Mallampati: II  TM Distance: >3 FB Neck ROM: Full    Dental  (+) Teeth Intact, Dental Advisory Given   Pulmonary sleep apnea ,    Pulmonary exam normal breath sounds clear to auscultation       Cardiovascular negative cardio ROS Normal cardiovascular exam Rhythm:Regular Rate:Normal     Neuro/Psych negative neurological ROS     GI/Hepatic negative GI ROS, Neg liver ROS,   Endo/Other  negative endocrine ROS  Renal/GU negative Renal ROS   Vaginal cuff bleeding s/p hysterectomy    Musculoskeletal  (+) Arthritis ,   Abdominal   Peds  Hematology   Anesthesia Other Findings Day of surgery medications reviewed with the patient.  Breast cancer  Reproductive/Obstetrics                             Anesthesia Physical  Anesthesia Plan  ASA: III and emergent  Anesthesia Plan: General and Regional   Post-op Pain Management:  Regional for Post-op pain   Induction: Intravenous  PONV Risk Score and Plan: 3 and Ondansetron, Dexamethasone, Midazolam and Scopolamine patch - Pre-op  Airway Management Planned: Oral ETT and LMA  Additional Equipment:   Intra-op Plan:   Post-operative Plan: Extubation in OR  Informed Consent: I have reviewed the patients History and Physical, chart, labs and discussed the procedure including the risks, benefits and alternatives for the proposed anesthesia with the patient or authorized representative who has indicated his/her understanding and acceptance.   Dental advisory given  Plan Discussed with: CRNA  Anesthesia Plan Comments: (Risks/benefits of general anesthesia discussed with patient including risk of damage to teeth, lips, gum, and tongue, nausea/vomiting,  allergic reactions to medications, and the possibility of heart attack, stroke and death.  All patient questions answered.  Patient wishes to proceed.)        Anesthesia Quick Evaluation

## 2018-02-16 NOTE — Interval H&P Note (Signed)
History and Physical Interval Note:  02/16/2018 10:44 AM  Monica [name]  has presented today for surgery, with the diagnosis of RIGHT BREAST CANCER  The various methods of treatment have been discussed with the patient and family. After consideration of risks, benefits and other options for treatment, the patient has consented to  Procedure(s): RIGHT BREAST LUMPECTOMY WITH RADIOACTIVE SEED AND SENTINEL LYMPH NODE BIOPSY (Right) INSERTION PORT-A-CATH (N/A) as a surgical intervention .  The patient's history has been reviewed, patient examined, no change in status, stable for surgery.  I have reviewed the patient's chart and labs.  Questions were answered to the patient's satisfaction.     [place]

## 2018-02-16 NOTE — Anesthesia Procedure Notes (Signed)
Procedure Name: LMA Insertion Date/Time: 02/16/2018 10:52 AM Performed by: Signe Colt, CRNA Pre-anesthesia Checklist: Patient identified, Emergency Drugs available, Suction available and Patient being monitored Patient Re-evaluated:Patient Re-evaluated prior to induction Oxygen Delivery Method: Circle system utilized Preoxygenation: Pre-oxygenation with 100% oxygen Induction Type: IV induction Ventilation: Mask ventilation without difficulty LMA: LMA inserted LMA Size: 4.0 Number of attempts: 1 Airway Equipment and Method: Bite block Placement Confirmation: positive ETCO2 Tube secured with: Tape Dental Injury: Teeth and Oropharynx as per pre-operative assessment

## 2018-02-16 NOTE — Anesthesia Procedure Notes (Addendum)
Anesthesia Regional Block: Pectoralis block   Pre-Anesthetic Checklist: ,, timeout performed, Correct Patient, Correct Site, Correct Laterality, Correct Procedure, Correct Position, site marked, Risks and benefits discussed,  Surgical consent,  Pre-op evaluation,  At surgeon's request and post-op pain management  Laterality: Right  Prep: chloraprep       Needles:  Injection technique: Single-shot  Needle Type: Stimiplex     Needle Length: 9cm  Needle Gauge: 21     Additional Needles:   Procedures:,,,, ultrasound used (permanent image in chart),,,,  Narrative:  Start time: 02/16/2018 10:09 AM End time: 02/16/2018 10:14 AM Injection made incrementally with aspirations every 5 mL.  Performed by: Personally  Anesthesiologist: Lynda Rainwater, MD

## 2018-02-17 ENCOUNTER — Encounter (HOSPITAL_BASED_OUTPATIENT_CLINIC_OR_DEPARTMENT_OTHER): Payer: Self-pay | Admitting: Surgery

## 2018-02-21 ENCOUNTER — Encounter (HOSPITAL_BASED_OUTPATIENT_CLINIC_OR_DEPARTMENT_OTHER): Payer: Self-pay | Admitting: *Deleted

## 2018-02-21 ENCOUNTER — Ambulatory Visit: Payer: Self-pay | Admitting: Surgery

## 2018-02-21 ENCOUNTER — Other Ambulatory Visit: Payer: Self-pay

## 2018-02-21 NOTE — Progress Notes (Signed)
Ensure pre surgery drink given with instruction to drink by 1045. Pt verbalized understanding with questions.

## 2018-02-22 ENCOUNTER — Ambulatory Visit (HOSPITAL_BASED_OUTPATIENT_CLINIC_OR_DEPARTMENT_OTHER): Payer: Managed Care, Other (non HMO) | Admitting: Anesthesiology

## 2018-02-22 ENCOUNTER — Encounter (HOSPITAL_BASED_OUTPATIENT_CLINIC_OR_DEPARTMENT_OTHER)
Admission: RE | Disposition: A | Discharge status: Home / Self Care | Payer: Self-pay | Source: Ambulatory Visit | Attending: Surgery

## 2018-02-22 ENCOUNTER — Other Ambulatory Visit: Payer: Self-pay

## 2018-02-22 ENCOUNTER — Encounter (HOSPITAL_BASED_OUTPATIENT_CLINIC_OR_DEPARTMENT_OTHER): Payer: Self-pay | Admitting: Anesthesiology

## 2018-02-22 ENCOUNTER — Ambulatory Visit (HOSPITAL_BASED_OUTPATIENT_CLINIC_OR_DEPARTMENT_OTHER)
Admission: RE | Admit: 2018-02-22 | Discharge: 2018-02-22 | Disposition: A | Discharge status: Home / Self Care | Payer: Managed Care, Other (non HMO) | Source: Ambulatory Visit | Attending: Surgery | Admitting: Surgery

## 2018-02-22 DIAGNOSIS — M47816 Spondylosis without myelopathy or radiculopathy, lumbar region: Secondary | ICD-10-CM | POA: Diagnosis not present

## 2018-02-22 DIAGNOSIS — C50911 Malignant neoplasm of unspecified site of right female breast: Secondary | ICD-10-CM | POA: Diagnosis present

## 2018-02-22 DIAGNOSIS — N6489 Other specified disorders of breast: Secondary | ICD-10-CM | POA: Diagnosis not present

## 2018-02-22 DIAGNOSIS — N6011 Diffuse cystic mastopathy of right breast: Secondary | ICD-10-CM | POA: Insufficient documentation

## 2018-02-22 HISTORY — PX: RE-EXCISION OF BREAST LUMPECTOMY: SHX6048

## 2018-02-22 SURGERY — EXCISION, LESION, BREAST
Anesthesia: General | Site: Breast | Laterality: Right

## 2018-02-22 MED ORDER — GABAPENTIN 300 MG PO CAPS
300.0000 mg | ORAL_CAPSULE | ORAL | Status: AC
  Administered 2018-02-22: 300 mg via ORAL

## 2018-02-22 MED ORDER — PROPOFOL 10 MG/ML IV BOLUS
INTRAVENOUS | Status: DC | PRN
  Administered 2018-02-22: 150 mg via INTRAVENOUS
  Administered 2018-02-22: 50 mg via INTRAVENOUS

## 2018-02-22 MED ORDER — GABAPENTIN 300 MG PO CAPS
ORAL_CAPSULE | ORAL | Status: AC
  Filled 2018-02-22: qty 1

## 2018-02-22 MED ORDER — MIDAZOLAM HCL 2 MG/2ML IJ SOLN: 1.0000 mg | INTRAMUSCULAR | Status: DC | PRN

## 2018-02-22 MED ORDER — 0.9 % SODIUM CHLORIDE (POUR BTL) OPTIME
TOPICAL | Status: DC | PRN
  Administered 2018-02-22: 120 mL

## 2018-02-22 MED ORDER — EPHEDRINE 5 MG/ML INJ
INTRAVENOUS | Status: AC
  Filled 2018-02-22: qty 30

## 2018-02-22 MED ORDER — PHENYLEPHRINE HCL 10 MG/ML IJ SOLN
INTRAMUSCULAR | Status: DC | PRN
  Administered 2018-02-22 (×2): 80 ug via INTRAVENOUS

## 2018-02-22 MED ORDER — HYDROCODONE-ACETAMINOPHEN 7.5-325 MG PO TABS: 1.0000 | ORAL_TABLET | Freq: Once | ORAL | Status: DC | PRN

## 2018-02-22 MED ORDER — CELECOXIB 200 MG PO CAPS
200.0000 mg | ORAL_CAPSULE | ORAL | Status: AC
  Administered 2018-02-22: 200 mg via ORAL

## 2018-02-22 MED ORDER — MIDAZOLAM HCL 5 MG/5ML IJ SOLN
INTRAMUSCULAR | Status: DC | PRN
  Administered 2018-02-22: 2 mg via INTRAVENOUS

## 2018-02-22 MED ORDER — BUPIVACAINE-EPINEPHRINE (PF) 0.25% -1:200000 IJ SOLN
INTRAMUSCULAR | Status: AC
  Filled 2018-02-22: qty 30

## 2018-02-22 MED ORDER — LIDOCAINE HCL (CARDIAC) PF 100 MG/5ML IV SOSY
PREFILLED_SYRINGE | INTRAVENOUS | Status: DC | PRN
  Administered 2018-02-22: 30 mg via INTRAVENOUS

## 2018-02-22 MED ORDER — DEXTROSE 5 % IV SOLN: 3.0000 g | INTRAVENOUS | Status: DC

## 2018-02-22 MED ORDER — CEFAZOLIN SODIUM-DEXTROSE 2-4 GM/100ML-% IV SOLN
INTRAVENOUS | Status: AC
  Filled 2018-02-22: qty 100

## 2018-02-22 MED ORDER — DEXAMETHASONE SODIUM PHOSPHATE 4 MG/ML IJ SOLN
INTRAMUSCULAR | Status: DC | PRN
  Administered 2018-02-22: 10 mg via INTRAVENOUS

## 2018-02-22 MED ORDER — CELECOXIB 200 MG PO CAPS
ORAL_CAPSULE | ORAL | Status: AC
  Filled 2018-02-22: qty 1

## 2018-02-22 MED ORDER — GLYCOPYRROLATE 0.2 MG/ML IJ SOLN
INTRAMUSCULAR | Status: DC | PRN
  Administered 2018-02-22 (×2): 0.1 mg via INTRAVENOUS

## 2018-02-22 MED ORDER — CEFAZOLIN SODIUM-DEXTROSE 2-4 GM/100ML-% IV SOLN
2.0000 g | INTRAVENOUS | Status: AC
  Administered 2018-02-22: 2 g via INTRAVENOUS

## 2018-02-22 MED ORDER — FENTANYL CITRATE (PF) 100 MCG/2ML IJ SOLN: 50.0000 ug | INTRAMUSCULAR | Status: DC | PRN

## 2018-02-22 MED ORDER — EPHEDRINE SULFATE 50 MG/ML IJ SOLN
INTRAMUSCULAR | Status: DC | PRN
  Administered 2018-02-22 (×3): 10 mg via INTRAVENOUS

## 2018-02-22 MED ORDER — SCOPOLAMINE 1 MG/3DAYS TD PT72
MEDICATED_PATCH | TRANSDERMAL | Status: AC
  Filled 2018-02-22: qty 1

## 2018-02-22 MED ORDER — FENTANYL CITRATE (PF) 100 MCG/2ML IJ SOLN
INTRAMUSCULAR | Status: AC
Start: 2018-02-22 — End: ?
  Filled 2018-02-22: qty 2

## 2018-02-22 MED ORDER — CHLORHEXIDINE GLUCONATE CLOTH 2 % EX PADS: 6.0000 | MEDICATED_PAD | Freq: Once | CUTANEOUS | Status: DC

## 2018-02-22 MED ORDER — MEPERIDINE HCL 25 MG/ML IJ SOLN: 6.2500 mg | INTRAMUSCULAR | Status: DC | PRN

## 2018-02-22 MED ORDER — MIDAZOLAM HCL 2 MG/2ML IJ SOLN
INTRAMUSCULAR | Status: AC
  Filled 2018-02-22: qty 2

## 2018-02-22 MED ORDER — PHENYLEPHRINE 40 MCG/ML (10ML) SYRINGE FOR IV PUSH (FOR BLOOD PRESSURE SUPPORT)
PREFILLED_SYRINGE | INTRAVENOUS | Status: AC
  Filled 2018-02-22: qty 20

## 2018-02-22 MED ORDER — FENTANYL CITRATE (PF) 100 MCG/2ML IJ SOLN
INTRAMUSCULAR | Status: DC | PRN
  Administered 2018-02-22: 75 ug via INTRAVENOUS

## 2018-02-22 MED ORDER — BUPIVACAINE-EPINEPHRINE 0.25% -1:200000 IJ SOLN
INTRAMUSCULAR | Status: DC | PRN
  Administered 2018-02-22: 10 mL

## 2018-02-22 MED ORDER — DEXAMETHASONE SODIUM PHOSPHATE 10 MG/ML IJ SOLN
INTRAMUSCULAR | Status: AC
  Filled 2018-02-22: qty 1

## 2018-02-22 MED ORDER — METOCLOPRAMIDE HCL 5 MG/ML IJ SOLN: 10.0000 mg | Freq: Once | INTRAMUSCULAR | Status: DC | PRN

## 2018-02-22 MED ORDER — PROPOFOL 10 MG/ML IV BOLUS
INTRAVENOUS | Status: AC
  Filled 2018-02-22: qty 20

## 2018-02-22 MED ORDER — ONDANSETRON HCL 4 MG/2ML IJ SOLN
INTRAMUSCULAR | Status: AC
  Filled 2018-02-22: qty 2

## 2018-02-22 MED ORDER — ACETAMINOPHEN 500 MG PO TABS
1000.0000 mg | ORAL_TABLET | ORAL | Status: AC
  Administered 2018-02-22: 1000 mg via ORAL

## 2018-02-22 MED ORDER — SCOPOLAMINE 1 MG/3DAYS TD PT72
1.0000 | MEDICATED_PATCH | Freq: Once | TRANSDERMAL | Status: DC | PRN
  Administered 2018-02-22: 1.5 mg via TRANSDERMAL

## 2018-02-22 MED ORDER — GLYCOPYRROLATE PF 0.2 MG/ML IJ SOSY
PREFILLED_SYRINGE | INTRAMUSCULAR | Status: AC
  Filled 2018-02-22: qty 1

## 2018-02-22 MED ORDER — FENTANYL CITRATE (PF) 100 MCG/2ML IJ SOLN
25.0000 ug | INTRAMUSCULAR | Status: DC | PRN
  Administered 2018-02-22: 50 ug via INTRAVENOUS

## 2018-02-22 MED ORDER — LACTATED RINGERS IV SOLN
INTRAVENOUS | Status: DC
  Administered 2018-02-22 (×2): via INTRAVENOUS

## 2018-02-22 MED ORDER — ONDANSETRON HCL 4 MG/2ML IJ SOLN
INTRAMUSCULAR | Status: DC | PRN
  Administered 2018-02-22: 4 mg via INTRAVENOUS

## 2018-02-22 MED ORDER — ACETAMINOPHEN 500 MG PO TABS
ORAL_TABLET | ORAL | Status: AC
  Filled 2018-02-22: qty 2

## 2018-02-22 MED ORDER — LIDOCAINE 2% (20 MG/ML) 5 ML SYRINGE
INTRAMUSCULAR | Status: AC
  Filled 2018-02-22: qty 5

## 2018-02-22 SURGICAL SUPPLY — 50 items
APPLIER CLIP 9.375 MED OPEN (MISCELLANEOUS)
BINDER BREAST LRG (GAUZE/BANDAGES/DRESSINGS) IMPLANT
BINDER BREAST MEDIUM (GAUZE/BANDAGES/DRESSINGS) IMPLANT
BINDER BREAST XLRG (GAUZE/BANDAGES/DRESSINGS) ×2 IMPLANT
BINDER BREAST XXLRG (GAUZE/BANDAGES/DRESSINGS) IMPLANT
BLADE SURG 15 STRL LF DISP TIS (BLADE) ×1 IMPLANT
BLADE SURG 15 STRL SS (BLADE) ×1
CANISTER SUCT 1200ML W/VALVE (MISCELLANEOUS) ×2 IMPLANT
CHLORAPREP W/TINT 26ML (MISCELLANEOUS) ×2 IMPLANT
CLIP APPLIE 9.375 MED OPEN (MISCELLANEOUS) IMPLANT
COVER BACK TABLE 60X90IN (DRAPES) ×2 IMPLANT
COVER MAYO STAND STRL (DRAPES) ×2 IMPLANT
DECANTER SPIKE VIAL GLASS SM (MISCELLANEOUS) ×2 IMPLANT
DERMABOND ADVANCED (GAUZE/BANDAGES/DRESSINGS) ×1
DERMABOND ADVANCED .7 DNX12 (GAUZE/BANDAGES/DRESSINGS) ×1 IMPLANT
DEVICE DUBIN W/COMP PLATE 8390 (MISCELLANEOUS) IMPLANT
DRAPE LAPAROSCOPIC ABDOMINAL (DRAPES) IMPLANT
DRAPE LAPAROTOMY 100X72 PEDS (DRAPES) ×2 IMPLANT
DRAPE UTILITY XL STRL (DRAPES) ×2 IMPLANT
ELECT COATED BLADE 2.86 ST (ELECTRODE) ×2 IMPLANT
ELECT REM PT RETURN 9FT ADLT (ELECTROSURGICAL) ×2
ELECTRODE REM PT RTRN 9FT ADLT (ELECTROSURGICAL) ×1 IMPLANT
GLOVE BIO SURGEON STRL SZ7.5 (GLOVE) ×2 IMPLANT
GLOVE BIOGEL PI IND STRL 8 (GLOVE) ×1 IMPLANT
GLOVE BIOGEL PI INDICATOR 8 (GLOVE) ×1
GLOVE ECLIPSE 8.0 STRL XLNG CF (GLOVE) ×2 IMPLANT
GOWN STRL REUS W/ TWL LRG LVL3 (GOWN DISPOSABLE) ×1 IMPLANT
GOWN STRL REUS W/ TWL XL LVL3 (GOWN DISPOSABLE) ×1 IMPLANT
GOWN STRL REUS W/TWL LRG LVL3 (GOWN DISPOSABLE) ×1
GOWN STRL REUS W/TWL XL LVL3 (GOWN DISPOSABLE) ×1
HEMOSTAT ARISTA ABSORB 3G PWDR (MISCELLANEOUS) IMPLANT
KIT MARKER MARGIN INK (KITS) ×2 IMPLANT
NDL SAFETY ECLIPSE 18X1.5 (NEEDLE) ×1 IMPLANT
NEEDLE HYPO 18GX1.5 SHARP (NEEDLE) ×1
NEEDLE HYPO 25X1 1.5 SAFETY (NEEDLE) ×2 IMPLANT
NS IRRIG 1000ML POUR BTL (IV SOLUTION) ×2 IMPLANT
PACK BASIN DAY SURGERY FS (CUSTOM PROCEDURE TRAY) ×2 IMPLANT
PENCIL BUTTON HOLSTER BLD 10FT (ELECTRODE) ×2 IMPLANT
SLEEVE SCD COMPRESS KNEE MED (MISCELLANEOUS) ×2 IMPLANT
SPONGE LAP 4X18 RFD (DISPOSABLE) ×2 IMPLANT
STAPLER VISISTAT 35W (STAPLE) IMPLANT
SUT MON AB 4-0 PC3 18 (SUTURE) ×2 IMPLANT
SUT SILK 2 0 SH (SUTURE) IMPLANT
SUT VICRYL 3-0 CR8 SH (SUTURE) ×2 IMPLANT
SYR 20CC LL (SYRINGE) ×2 IMPLANT
SYR CONTROL 10ML LL (SYRINGE) ×4 IMPLANT
TOWEL GREEN STERILE FF (TOWEL DISPOSABLE) ×2 IMPLANT
TOWEL OR NON WOVEN STRL DISP B (DISPOSABLE) IMPLANT
TUBE CONNECTING 20X1/4 (TUBING) ×2 IMPLANT
YANKAUER SUCT BULB TIP NO VENT (SUCTIONS) ×2 IMPLANT

## 2018-02-22 NOTE — Anesthesia Procedure Notes (Signed)
Procedure Name: LMA Insertion Date/Time: 02/22/2018 2:19 PM Performed by: Marrianne Mood, CRNA Pre-anesthesia Checklist: Patient identified, Emergency Drugs available, Suction available, Patient being monitored and Timeout performed Patient Re-evaluated:Patient Re-evaluated prior to induction Oxygen Delivery Method: Circle system utilized Preoxygenation: Pre-oxygenation with 100% oxygen Induction Type: IV induction Ventilation: Mask ventilation without difficulty LMA: LMA inserted LMA Size: 4.0 Number of attempts: 1 Airway Equipment and Method: Bite block Placement Confirmation: positive ETCO2 Tube secured with: Tape Dental Injury: Teeth and Oropharynx as per pre-operative assessment

## 2018-02-22 NOTE — Anesthesia Postprocedure Evaluation (Signed)
Anesthesia Post Note  Patient: Art gallery manager  Procedure(s) Performed: RE-EXCISION OF RIGHT  BREAST LUMPECTOMY (Right Breast)     Patient location during evaluation: PACU Anesthesia Type: General Level of consciousness: awake and alert and oriented Pain management: pain level controlled Vital Signs Assessment: post-procedure vital signs reviewed and stable Respiratory status: spontaneous breathing, nonlabored ventilation and respiratory function stable Cardiovascular status: blood pressure returned to baseline and stable Postop Assessment: no apparent nausea or vomiting Anesthetic complications: no    Last Vitals:  Vitals:   02/22/18 1503 02/22/18 1515  BP:  113/65  Pulse: 74 71  Resp: 13 (!) 9  Temp: 36.9 C   SpO2: 100% 100%    Last Pain:  Vitals:   02/22/18 1530  TempSrc:   PainSc: 3                  Adonna Horsley A.

## 2018-02-22 NOTE — H&P (Signed)
Monica [name] is an 47 y.o. female.   Chief Complaint: right breast cancer  HPI: pt returns for re excision of right breast inferior margin  That was positive margin.    Past Medical History:  Diagnosis Date  . Arthritis    lower back  . Cancer [facility])    right breast cancer  . Family history of breast cancer     Past Surgical History:  Procedure Laterality Date  . ABDOMINAL HYSTERECTOMY    . BREAST LUMPECTOMY WITH RADIOACTIVE SEED AND SENTINEL LYMPH NODE BIOPSY Right 02/16/2018   Procedure: RIGHT BREAST LUMPECTOMY WITH RADIOACTIVE SEED AND SENTINEL LYMPH NODE BIOPSY;  Surgeon: [name], MD;  Location: [place];  Service: General;  Laterality: Right;  . HYMENECTOMY    . PORTACATH PLACEMENT Right 02/16/2018   Procedure: INSERTION PORT-A-CATH;  Surgeon: [name], MD;  Location: [place];  Service: General;  Laterality: Right;  . REPAIR VAGINAL CUFF N/A 02/07/2017   Procedure: REPAIR VAGINAL CUFF;  Surgeon: Ena Dawley, MD;  Location: [facility];  Service: Gynecology;  Laterality: N/A;  . ROBOTIC ASSISTED TOTAL HYSTERECTOMY WITH SALPINGECTOMY Left 01/20/2017   Procedure: ROBOTIC ASSISTED TOTAL HYSTERECTOMY WITH SALPINGECTOMY;  Surgeon: [name], MD;  Location: [place];  Service: Gynecology;  Laterality: Left;    Family History  Problem Relation Age of Onset  . Lung cancer Maternal Grandfather   . Esophageal cancer Paternal Grandfather 77  . Breast cancer Cousin 3   Social History:  reports that she has never smoked. She has never used smokeless tobacco. She reports that she drinks alcohol. She reports that she does not use drugs.  Allergies: No Known Allergies  Medications Prior to Admission  Medication Sig Dispense Refill  . ibuprofen (ADVIL,MOTRIN) 800 MG tablet Take 1 tablet (800 mg total) by mouth every 8 (eight) hours as needed. 30 tablet 0  . oxyCODONE (OXY IR/ROXICODONE) 5 MG immediate release tablet Take 1 tablet (5 mg total) by  mouth every 6 (six) hours as needed for severe pain. 15 tablet 0    No results found for this or any previous visit (from the past 48 hour(s)). No results found.  Review of Systems  All other systems reviewed and are negative.   Blood pressure 115/69, pulse 85, temperature 98.4 F (36.9 C), temperature source Oral, resp. rate 18, height 5\' 8"  (1.727 m), weight 77.8 kg, last menstrual period 12/21/2016, SpO2 100 %. Physical Exam  Constitutional: She is oriented to person, place, and time. She appears well-developed.  HENT:  Head: Normocephalic.  Neck: Normal range of motion.  Respiratory:  incisions CDI   Musculoskeletal: Normal range of motion.  Neurological: She is alert and oriented to person, place, and time.  Skin: Skin is warm and dry.  Psychiatric: She has a normal mood and affect. Her behavior is normal.     Assessment/Plan Right breast cancer  Positive margin  Here re excision  The procedure has been discussed with the patient.  Alternative therapies have been discussed with the patient.  Operative risks include bleeding,  Infection,  Organ injury,  Nerve injury,  Blood vessel injury,  DVT,  Pulmonary embolism,  Death,  And possible reoperation.  Medical management risks include worsening of present situation.  The success of the procedure is 50 -90 % at treating patients symptoms.  The patient understands and agrees to proceed.  Turner Daniels, MD 02/22/2018, 1:50 PM

## 2018-02-22 NOTE — Interval H&P Note (Signed)
History and Physical Interval Note:  02/22/2018 2:01 PM  Monica [name]  has presented today for surgery, with the diagnosis of LEFT BREAST CANCER  The various methods of treatment have been discussed with the patient and family. After consideration of risks, benefits and other options for treatment, the patient has consented to  Procedure(s): RE-EXCISION OF RIGHT  BREAST LUMPECTOMY (Right) as a surgical intervention .  The patient's history has been reviewed, patient examined, no change in status, stable for surgery.  I have reviewed the patient's chart and labs.  Questions were answered to the patient's satisfaction.     [place]

## 2018-02-22 NOTE — Op Note (Signed)
Right breast Re-excison Lumpectomy Procedure Note  Indications:  This patient returns following an initial lumpectomy.  Analysis of the pathology specimen revealed microscopic involvement of the inferior margins.  The patient now returns for re-excision. The procedure has been discussed with the patient. Alternatives to surgery have been discussed with the patient.  Risks of surgery include bleeding,  Infection,  Seroma formation, death,  and the need for further surgery.   The patient understands and wishes to proceed. Pre-operative Diagnosis: right breast cancer  Post-operative Diagnosis: right breast cancer  Surgeon: Joyice Faster Tashonda Pinkus   Assistants: None  Anesthesia: General endotracheal anesthesia and Local anesthesia 0.25.% bupivacaine, with epinephrine  ASA Class: 1  Procedure Details  The patient was seen in the Holding Room. The risks, benefits, complications, treatment options, and expected outcomes were discussed with the patient. The possibilities of reaction to medication, pulmonary aspiration, bleeding, infection, the need for additional procedures, failure to diagnose a condition, and creating a complication requiring transfusion or operation were discussed with the patient. The patient concurred with the proposed plan, giving informed consent.  The site of surgery properly noted/marked. The patient was taken to Operating Room #2, identified as Monica [name] and the procedure verified as Breast Re-excision Lumpectomy. A Time Out was held and the above information confirmed.  the patient was placed supine.  The breast was prepped and draped in standard fashion. Marcaine 0.25% with epinephrine was used to anesthetize the skin around the previous lumpectomy incision.  The incision was opened.  A  seroma was evacuated from the lumpectomy cavity and sentinel lymph node site due to tense seroma.  Additional local anesthesia was delivered inferiorly within the lumpectomy cavity.  A full  thickness re-excision was performed. .  The new margin was inked and the specimen was submitted to pathology.  Hemostasis was achieved with cautery.  Closure was performed in 2 layers with a 3 -0 Vicryl and 4-0 Monocryl subcuticular closure.    Dermabond  applied.  At the end of the operation, all sponge, instrument and needle counts were correct.   Findings: grossly clear surgical margins  Estimated Blood Loss:  Minimal         Drains: None         Total IV Fluids: Per anesthesia record         Specimens: Inferior margin                  Complications:  None; patient tolerated the procedure well.         Disposition: PACU - hemodynamically stable.         Condition: stable  Attending Attestation: I performed the procedure.

## 2018-02-22 NOTE — Discharge Instructions (Signed)
[facility] °Office Phone Number [phone] ° °BREAST BIOPSY/ PARTIAL MASTECTOMY: POST OP INSTRUCTIONS ° °Always review your discharge instruction sheet given to you by the facility where your surgery was performed. ° °IF YOU HAVE DISABILITY OR FAMILY LEAVE FORMS, YOU MUST BRING THEM TO THE OFFICE FOR PROCESSING.  DO NOT GIVE THEM TO YOUR DOCTOR. ° °1. A prescription for pain medication may be given to you upon discharge.  Take your pain medication as prescribed, if needed.  If narcotic pain medicine is not needed, then you may take acetaminophen (Tylenol) or ibuprofen (Advil) as needed. °2. Take your usually prescribed medications unless otherwise directed °3. If you need a refill on your pain medication, please contact your pharmacy.  They will contact our office to request authorization.  Prescriptions will not be filled after 5pm or on week-ends. °4. You should eat very light the first 24 hours after surgery, such as soup, crackers, pudding, etc.  Resume your normal diet the day after surgery. °5. Most patients will experience some swelling and bruising in the breast.  Ice packs and a good support bra will help.  Swelling and bruising can take several days to resolve.  °6. It is common to experience some constipation if taking pain medication after surgery.  Increasing fluid intake and taking a stool softener will usually help or prevent this problem from occurring.  A mild laxative (Milk of Magnesia or Miralax) should be taken according to package directions if there are no bowel movements after 48 hours. °7. Unless discharge instructions indicate otherwise, you may remove your bandages 24-48 hours after surgery, and you may shower at that time.  You may have steri-strips (small skin tapes) in place directly over the incision.  These strips should be left on the skin for 7-10 days.  If your surgeon used skin glue on the incision, you may shower in 24 hours.  The glue will flake off over the  next 2-3 weeks.  Any sutures or staples will be removed at the office during your follow-up visit. °8. ACTIVITIES:  You may resume regular daily activities (gradually increasing) beginning the next day.  Wearing a good support bra or sports bra minimizes pain and swelling.  You may have sexual intercourse when it is comfortable. °a. You may drive when you no longer are taking prescription pain medication, you can comfortably wear a seatbelt, and you can safely maneuver your car and apply brakes. °b. RETURN TO WORK:  ______________________________________________________________________________________ °9. You should see your doctor in the office for a follow-up appointment approximately two weeks after your surgery.  Your doctor’s nurse will typically make your follow-up appointment when she calls you with your pathology report.  Expect your pathology report 2-3 business days after your surgery.  You may call to check if you do not hear from us after three days. °10. OTHER INSTRUCTIONS: _______________________________________________________________________________________________ _____________________________________________________________________________________________________________________________________ °_____________________________________________________________________________________________________________________________________ °_____________________________________________________________________________________________________________________________________ ° °WHEN TO CALL YOUR DOCTOR: °1. Fever over 101.0 °2. Nausea and/or vomiting. °3. Extreme swelling or bruising. °4. Continued bleeding from incision. °5. Increased pain, redness, or drainage from the incision. ° °The clinic staff is available to answer your questions during regular business hours.  Please don’t hesitate to call and ask to speak to one of the nurses for clinical concerns.  If you have a medical emergency, go to the nearest  emergency room or call 911.  A surgeon from [facility] is always on call at the hospital. ° °For further questions, please visit [email or web address]  ° ° ° ° °  Post Anesthesia Home Care Instructions ° °Activity: °Get plenty of rest for the remainder of the day. A responsible individual must stay with you for 24 hours following the procedure.  °For the next 24 hours, DO NOT: °-Drive a car °-Operate machinery °-Drink alcoholic beverages °-Take any medication unless instructed by your physician °-Make any legal decisions or sign important papers. ° °Meals: °Start with liquid foods such as gelatin or soup. Progress to regular foods as tolerated. Avoid greasy, spicy, heavy foods. If nausea and/or vomiting occur, drink only clear liquids until the nausea and/or vomiting subsides. Call your physician if vomiting continues. ° °Special Instructions/Symptoms: °Your throat may feel dry or sore from the anesthesia or the breathing tube placed in your throat during surgery. If this causes discomfort, gargle with warm salt water. The discomfort should disappear within 24 hours. ° °If you had a scopolamine patch placed behind your ear for the management of post- operative nausea and/or vomiting: ° °1. The medication in the patch is effective for 72 hours, after which it should be removed.  Wrap patch in a tissue and discard in the trash. Wash hands thoroughly with soap and water. °2. You may remove the patch earlier than 72 hours if you experience unpleasant side effects which may include dry mouth, dizziness or visual disturbances. °3. Avoid touching the patch. Wash your hands with soap and water after contact with the patch. °  ° °

## 2018-02-22 NOTE — Transfer of Care (Signed)
Immediate Anesthesia Transfer of Care Note  Patient: Monica [name]  Procedure(s) Performed: RE-EXCISION OF RIGHT  BREAST LUMPECTOMY (Right Breast)  Patient Location: PACU  Anesthesia Type:General  Level of Consciousness: awake and patient cooperative  Airway & Oxygen Therapy: Patient Spontanous Breathing and Patient connected to face mask oxygen  Post-op Assessment: Report given to RN and Post -op Vital signs reviewed and stable  Post vital signs: Reviewed and stable  Last Vitals:  Vitals Value Taken Time  BP    Temp    Pulse    Resp    SpO2      Last Pain:  Vitals:   02/22/18 1251  TempSrc: Oral  PainSc: 9       Patients Stated Pain Goal: 4 (27/07/86 7544)  Complications: No apparent anesthesia complications

## 2018-02-22 NOTE — Anesthesia Preprocedure Evaluation (Addendum)
Anesthesia Evaluation  Patient identified by MRN, date of birth, ID band Patient awake    Reviewed: Allergy & Precautions, NPO status , Patient's Chart, lab work & pertinent test results  Airway Mallampati: II  TM Distance: >3 FB Neck ROM: Full    Dental no notable dental hx. (+) Teeth Intact   Pulmonary neg pulmonary ROS,    Pulmonary exam normal breath sounds clear to auscultation       Cardiovascular negative cardio ROS Normal cardiovascular exam Rhythm:Regular Rate:Normal     Neuro/Psych negative neurological ROS  negative psych ROS   GI/Hepatic negative GI ROS, Neg liver ROS,   Endo/Other  negative endocrine ROSLeft breast Ca- incomplete margins  Renal/GU negative Renal ROS  negative genitourinary   Musculoskeletal  (+) Arthritis , Osteoarthritis,    Abdominal (+) - obese,   Peds  Hematology  (+) anemia ,   Anesthesia Other Findings Orthodontic appliances  Reproductive/Obstetrics                            Anesthesia Physical Anesthesia Plan  ASA: II  Anesthesia Plan: General   Post-op Pain Management:    Induction: Intravenous  PONV Risk Score and Plan: 4 or greater and Midazolam, Ondansetron, Propofol infusion and Treatment may vary due to age or medical condition  Airway Management Planned: LMA  Additional Equipment:   Intra-op Plan:   Post-operative Plan: Extubation in OR  Informed Consent: I have reviewed the patients History and Physical, chart, labs and discussed the procedure including the risks, benefits and alternatives for the proposed anesthesia with the patient or authorized representative who has indicated his/her understanding and acceptance.   Dental advisory given  Plan Discussed with: CRNA and Surgeon  Anesthesia Plan Comments:         Anesthesia Quick Evaluation

## 2018-02-23 ENCOUNTER — Encounter (HOSPITAL_BASED_OUTPATIENT_CLINIC_OR_DEPARTMENT_OTHER): Payer: Self-pay | Admitting: Surgery

## 2018-02-28 NOTE — Progress Notes (Signed)
[place]  [phone] [phone]     ID: Monica [name] DOB: 1971-03-11  MR#: [id number]  ZHG#:[id number]  Patient Care Team: [name], PA-C as PCP - General (Family Medicine) [name], MD as Consulting Physician (General Surgery) Kaedance Magos, [name], MD as Consulting Physician (Oncology) [name], MD as Consulting Physician (Radiation Oncology) [name], MD as Consulting Physician (Obstetrics and Gynecology) OTHER MD: [facility]  CHIEF COMPLAINT: triple positive breast cancer  CURRENT TREATMENT: awaiting definitive surgery   HISTORY OF CURRENT ILLNESS: From the original intake note:  Monica [name] (pronounced "uric") palpated a right supraclavicular mass and felt tenderness after stretching one day. She brought this to her PA's attention.  The patient underwent bilateral diagnostic mammography with tomography and right breast ultrasonography at [facility] on 12/20/2017 showing: breast density category C. There was a suspicious palpable mass at the 1 o'clock position upper inner quadrant measuring 1.4 x 1.3 x 1.3 cm located 12 cm from the nipple. There was an additional mass at the 8 o'clock radiant measuring 1.2 x 1.0 x 1.4 cm, which likely represented a cyst. Ultrasound evaluation of the right axilla demonstrates no evidence of lymphadenopathy  Accordingly on 12/29/2017 she proceeded to biopsy of the right breast area in question. The pathology from this procedure showed ([id number]): Invasive ductal carcinoma, grade III. Prognostic indicators significant for: estrogen receptor, 80% positive and progesterone receptor, 70% positive, both with strong staining intensity. Proliferation marker Ki67 at 80%. HER2 amplified with ratios HER2/CEP17 signals 3.44 and average HER2 copies per cell 10.65.  The patient's subsequent history is as detailed below.  INTERVAL HISTORY: [name] returns today for follow up and  treatment of her triple positive breast cancer.    Since her last visit, she underwent right lumpectomy with sentinel lymph node sampling on 02/16/2018 showing: Invasive ductal carcinoma grade III, spanning 2.3 cm. Invasive carcinoma is broadly less than 0.1 cm to the anterior margin. DCIS is focally present at the anterior margin and broadly less than 0.1 cm to the superior margin. The right inferior margin was positive for DCIS. Lymphovasular invasion is identified. There was no evidence of malignancy in the right anterior, superior, lateral, or medial margin There was no evidence of carcinoma in 5 right axillary sentinel lymph nodes.   On 02/22/2018 she underwent final inferior margin clearing ([id number]).  This was successful.  She has been tired lately, but she tolerated her lumpectomy well. She denies having pain, but it feels variably uncomfortable at times.  She took ibuprofen for pain management. She denies bleeding or fever implications.     REVIEW OF SYSTEMS: [name] reports that she returned to work. She tries to take the dog for a walk when it is not too hot. She denies unusual headaches, visual changes, nausea, vomiting, or dizziness. There has been no unusual cough, phlegm production, or pleurisy. There has been no change in bowel or bladder habits. She denies unexplained fatigue or unexplained weight loss, bleeding, rash, or fever. A detailed review of systems was otherwise stable.    PAST MEDICAL HISTORY: Past Medical History:  Diagnosis Date  . Arthritis    lower back  . Cancer [facility])    right breast cancer  . Family history of breast cancer   She notes that she had a heart murmur as a child, which she grew out of.   PAST SURGICAL HISTORY: Past Surgical History:  Procedure Laterality Date  . ABDOMINAL HYSTERECTOMY    . BREAST  LUMPECTOMY WITH RADIOACTIVE SEED AND SENTINEL LYMPH NODE BIOPSY Right 02/16/2018   Procedure: RIGHT BREAST LUMPECTOMY WITH RADIOACTIVE SEED AND  SENTINEL LYMPH NODE BIOPSY;  Surgeon: [name], MD;  Location: [facility];  Service: General;  Laterality: Right;  . HYMENECTOMY    . PORTACATH PLACEMENT Right 02/16/2018   Procedure: INSERTION PORT-A-CATH;  Surgeon: [name], MD;  Location: [facility];  Service: General;  Laterality: Right;  . RE-EXCISION OF BREAST LUMPECTOMY Right 02/22/2018   Procedure: RE-EXCISION OF RIGHT  BREAST LUMPECTOMY;  Surgeon: [name], MD;  Location: [facility];  Service: General;  Laterality: Right;  . REPAIR VAGINAL CUFF N/A 02/07/2017   Procedure: REPAIR VAGINAL CUFF;  Surgeon: Ena Dawley, MD;  Location: [facility];  Service: Gynecology;  Laterality: N/A;  . ROBOTIC ASSISTED TOTAL HYSTERECTOMY WITH SALPINGECTOMY Left 01/20/2017   Procedure: ROBOTIC ASSISTED TOTAL HYSTERECTOMY WITH SALPINGECTOMY;  Surgeon: [name], MD;  Location: [facility];  Service: Gynecology;  Laterality: Left;  Partial Hysterectomy without BSO  FAMILY HISTORY Family History  Problem Relation Age of Onset  . Lung cancer Maternal Grandfather   . Esophageal cancer Paternal Grandfather 16  . Breast cancer Cousin 79  As of July 2019, the patient's father is alive at 29. The patient's mother is also alive at 82. The patient has 1 brother and 5 sisters. There was a maternal grandfather diagnosed with lung cancer at 12. There was a paternal grandfather diagnosed with esophageal cancer at 17. The patient's father had a pre-cancerous esophageal finding at age 73. There was also a maternal 1st cousin diagnosed with metastatic breast cancer at age 69.    GYNECOLOGIC HISTORY:  Patient's last menstrual period was 12/21/2016 (exact date). Menarche: 47 years old Age at first live birth: 47 years old She is GXP2. She is status post partial hysterectomy without BSO in 2018 She never used HRT. She used oral contraception over 21 years ago with no complications.   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 The patient is separated from her husband, [name]. The patient's son Monica [name] age 1 is in the [place] [employer] stationed in [place] in the Horticulturist, commercial. He plans on working in the railroad industry after he returns. The patient's daughter [name] age 34, works at [employer].      ADVANCED DIRECTIVES: Not in place   HEALTH MAINTENANCE: Social History   Tobacco Use  . Smoking status: Never Smoker  . Smokeless tobacco: Never Used  Substance Use Topics  . Alcohol use: Yes    Comment: occ  . Drug use: No     Colonoscopy:   PAP: 2018/ prior to hysterectomy  Bone density:   No Known Allergies  Current Outpatient Medications  Medication Sig Dispense Refill  . ibuprofen (ADVIL,MOTRIN) 800 MG tablet Take 1 tablet (800 mg total) by mouth every 8 (eight) hours as needed. 30 tablet 0  . oxyCODONE (OXY IR/ROXICODONE) 5 MG immediate release tablet Take 1 tablet (5 mg total) by mouth every 6 (six) hours as needed for severe pain. 15 tablet 0   No current facility-administered medications for this visit.     OBJECTIVE: Middle-aged white woman who appears well  Vitals:   03/01/18 1437  BP: 129/81  Pulse: (!) 54  Resp: 18  Temp: 99.1 F (37.3 C)  SpO2: 100%     Body mass index is 26.38 kg/m.   Wt Readings from Last 3 Encounters:  03/01/18 173 lb 8 oz (78.7 kg)  02/22/18 171 lb 9.6 oz (77.8 kg)  02/16/18 169 lb (76.7 kg)      ECOG FS:0 - Asymptomatic  Sclerae unicteric, EOMs intact Oropharynx clear and moist No cervical or supraclavicular adenopathy Lungs no rales or rhonchi Heart regular rate and rhythm Abd soft, nontender, positive bowel sounds MSK no focal spinal tenderness, no upper extremity lymphedema Neuro: nonfocal, well oriented, appropriate affect Breasts: The right breast is status post lumpectomy.  The cosmetic result is excellent.  The incisions are healing very nicely.  There is no evidence of  residual or recurrent disease.  The left breast is benign.  Both axillae are benign.  LAB RESULTS:  CMP     Component Value Date/Time   NA 139 02/14/2018 1446   K 4.1 02/14/2018 1446   CL 102 02/14/2018 1446   CO2 28 02/14/2018 1446   GLUCOSE 85 02/14/2018 1446   BUN 8 02/14/2018 1446   CREATININE 0.75 02/14/2018 1446   CREATININE 0.78 01/12/2018 0902   CALCIUM 9.7 02/14/2018 1446   PROT 7.0 02/14/2018 1446   ALBUMIN 4.1 02/14/2018 1446   AST 16 02/14/2018 1446   AST 11 (L) 01/12/2018 0902   ALT 13 02/14/2018 1446   ALT 9 01/12/2018 0902   ALKPHOS 33 (L) 02/14/2018 1446   BILITOT 1.0 02/14/2018 1446   BILITOT 0.4 01/12/2018 0902   GFRNONAA >60 02/14/2018 1446   GFRNONAA >60 01/12/2018 0902   GFRAA >60 02/14/2018 1446   GFRAA >60 01/12/2018 0902    No results found for: TOTALPROTELP, ALBUMINELP, A1GS, A2GS, BETS, BETA2SER, GAMS, MSPIKE, SPEI  No results found for: KPAFRELGTCHN, LAMBDASER, KAPLAMBRATIO  Lab Results  Component Value Date   WBC 7.4 02/14/2018   NEUTROABS 5.2 02/14/2018   HGB 14.0 02/14/2018   HCT 43.4 02/14/2018   MCV 93.1 02/14/2018   PLT 299 02/14/2018    '@LASTCHEMISTRY' @  No results found for: LABCA2  No components found for: NFAOZH086  No results for input(s): INR in the last 168 hours.  No results found for: LABCA2  No results found for: VHQ469  No results found for: GEX528  No results found for: UXL244  No results found for: CA2729  No components found for: HGQUANT  No results found for: CEA1 / No results found for: CEA1   No results found for: AFPTUMOR  No results found for: CHROMOGRNA  No results found for: PSA1  No visits with results within 3 Day(s) from this visit.  Latest known visit with results is:  Admission on 02/16/2018, Discharged on 02/16/2018  Component Date Value Ref Range Status  . WBC 02/14/2018 7.4  4.0 - 10.5 K/uL Final  . RBC 02/14/2018 4.66  3.87 - 5.11 MIL/uL Final  . Hemoglobin 02/14/2018 14.0   12.0 - 15.0 g/dL Final  . HCT 02/14/2018 43.4  36.0 - 46.0 % Final  . MCV 02/14/2018 93.1  78.0 - 100.0 fL Final  . MCH 02/14/2018 30.0  26.0 - 34.0 pg Final  . MCHC 02/14/2018 32.3  30.0 - 36.0 g/dL Final  . RDW 02/14/2018 11.9  11.5 - 15.5 % Final  . Platelets 02/14/2018 299  150 - 400 K/uL Final  . Neutrophils Relative % 02/14/2018 70  % Final  . Neutro Abs 02/14/2018 5.2  1.7 - 7.7 K/uL Final  . Lymphocytes Relative 02/14/2018 20  % Final  . Lymphs Abs 02/14/2018 1.5  0.7 - 4.0 K/uL Final  . Monocytes Relative 02/14/2018 8  %  Final  . Monocytes Absolute 02/14/2018 0.6  0.1 - 1.0 K/uL Final  . Eosinophils Relative 02/14/2018 1  % Final  . Eosinophils Absolute 02/14/2018 0.1  0.0 - 0.7 K/uL Final  . Basophils Relative 02/14/2018 1  % Final  . Basophils Absolute 02/14/2018 0.1  0.0 - 0.1 K/uL Final  . Immature Granulocytes 02/14/2018 0  % Final  . Abs Immature Granulocytes 02/14/2018 0.0  0.0 - 0.1 K/uL Final   Performed at [facility], [place] [address] [place], [place] [address]  . Sodium 02/14/2018 139  135 - 145 mmol/L Final  . Potassium 02/14/2018 4.1  3.5 - 5.1 mmol/L Final  . Chloride 02/14/2018 102  98 - 111 mmol/L Final  . CO2 02/14/2018 28  22 - 32 mmol/L Final  . Glucose, Bld 02/14/2018 85  70 - 99 mg/dL Final  . BUN 02/14/2018 8  6 - 20 mg/dL Final  . Creatinine, Ser 02/14/2018 0.75  0.44 - 1.00 mg/dL Final  . Calcium 02/14/2018 9.7  8.9 - 10.3 mg/dL Final  . Total Protein 02/14/2018 7.0  6.5 - 8.1 g/dL Final  . Albumin 02/14/2018 4.1  3.5 - 5.0 g/dL Final  . AST 02/14/2018 16  15 - 41 U/L Final  . ALT 02/14/2018 13  0 - 44 U/L Final  . Alkaline Phosphatase 02/14/2018 33* 38 - 126 U/L Final  . Total Bilirubin 02/14/2018 1.0  0.3 - 1.2 mg/dL Final  . GFR calc non Af Amer 02/14/2018 >60  >60 mL/min Final  . GFR calc Af Amer 02/14/2018 >60  >60 mL/min Final   Comment: (NOTE) The eGFR has been calculated using the CKD EPI equation. This calculation has not been  validated in all clinical situations. eGFR's persistently <60 mL/min signify possible Chronic Kidney Disease.   Georgiann Hahn gap 02/14/2018 9  5 - 15 Final   Performed at [facility], [place] [address], [place], [place] [address]    (this displays the last labs from the last 3 days)  No results found for: TOTALPROTELP, ALBUMINELP, A1GS, A2GS, BETS, BETA2SER, GAMS, MSPIKE, SPEI (this displays SPEP labs)  No results found for: KPAFRELGTCHN, LAMBDASER, KAPLAMBRATIO (kappa/lambda light chains)  No results found for: HGBA, HGBA2QUANT, HGBFQUANT, HGBSQUAN (Hemoglobinopathy evaluation)   No results found for: LDH  No results found for: IRON, TIBC, IRONPCTSAT (Iron and TIBC)  No results found for: FERRITIN  Urinalysis No results found for: COLORURINE, APPEARANCEUR, LABSPEC, PHURINE, GLUCOSEU, HGBUR, BILIRUBINUR, KETONESUR, PROTEINUR, UROBILINOGEN, NITRITE, LEUKOCYTESUR   STUDIES: Nm Sentinel Node Inj-no Rpt (breast)  Result Date: 02/16/2018 Sulfur colloid was injected by the nuclear medicine technologist for melanoma sentinel node.   Mm Breast Surgical Specimen  Result Date: 02/16/2018 CLINICAL DATA:  Evaluate surgical specimen following lumpectomy for RIGHT breast cancer. EXAM: SPECIMEN RADIOGRAPH OF THE RIGHT BREAST COMPARISON:  Previous exam(s). FINDINGS: Status post excision of the RIGHT breast. The radioactive seed and biopsy marker clip are present, completely intact, and were marked for pathology. IMPRESSION: Specimen radiograph of the RIGHT breast. Electronically Signed   By: Margarette Canada M.D.   On: 02/16/2018 12:03   Dg Chest Port 1 View  Result Date: 02/16/2018 CLINICAL DATA:  Status post Port-A-Cath placement EXAM: PORTABLE CHEST 1 VIEW COMPARISON:  None. FINDINGS: There is a right-sided Port-A-Cath with the tip projecting over the SVC. There is no focal parenchymal opacity. There is no pleural effusion or pneumothorax. The heart and mediastinal contours are unremarkable. The  osseous structures are unremarkable.  IMPRESSION: Right-sided Port-A-Cath in satisfactory position. Electronically Signed   By: Kathreen Devoid   On: 02/16/2018 13:13   Dg Fluoro Guide Cv Line-no Report  Result Date: 02/16/2018 Fluoroscopy was utilized by the requesting physician.  No radiographic interpretation.   [place] Rt Radioactive Seed Loc  Result Date: 02/15/2018 CLINICAL DATA:  47 year old female for radioactive seed localization of RIGHT breast cancer prior to RIGHT lumpectomy. EXAM: ULTRASOUND GUIDED RADIOACTIVE SEED LOCALIZATION OF THE RIGHT BREAST POST SEED RIGHT MAMMOGRAM COMPARISON:  Previous exam(s). FINDINGS: Patient presents for radioactive seed localization prior to RIGHT LUMPECTOMY. I met with the patient and we discussed the procedure of seed localization including benefits and alternatives. We discussed the high likelihood of a successful procedure. We discussed the risks of the procedure including infection, bleeding, tissue injury and further surgery. We discussed the low dose of radioactivity involved in the procedure. Informed, written consent was given. The usual time-out protocol was performed immediately prior to the procedure. Using ultrasound guidance, sterile technique, 1% lidocaine and an I-125 radioactive seed, the 1.4 cm mass at the 1 o'clock position of the RIGHT breast 12 cm from the nipple containing biopsy clip was localized using a LATERAL approach. The follow-up 3D MLO mammograms confirm the seed in the expected location and were marked for Dr. Brantley Stage. Follow-up survey of the patient confirms presence of the radioactive seed. Order number of I-125 seed:  [id number]. Total activity:  4.098 millicuries.  Reference Date: 01/14/2018 The patient tolerated the procedure well and was released from the [facility]. She was given instructions regarding seed removal. IMPRESSION: Radioactive seed localization RIGHT breast. No apparent complications. Electronically Signed   By: Margarette Canada M.D.   On: 02/15/2018 14:19   Mm Clip Placement Right  Result Date: 02/15/2018 CLINICAL DATA:  47 year old female for radioactive seed localization of RIGHT breast cancer prior to RIGHT lumpectomy. EXAM: ULTRASOUND GUIDED RADIOACTIVE SEED LOCALIZATION OF THE RIGHT BREAST POST SEED RIGHT MAMMOGRAM COMPARISON:  Previous exam(s). FINDINGS: Patient presents for radioactive seed localization prior to RIGHT LUMPECTOMY. I met with the patient and we discussed the procedure of seed localization including benefits and alternatives. We discussed the high likelihood of a successful procedure. We discussed the risks of the procedure including infection, bleeding, tissue injury and further surgery. We discussed the low dose of radioactivity involved in the procedure. Informed, written consent was given. The usual time-out protocol was performed immediately prior to the procedure. Using ultrasound guidance, sterile technique, 1% lidocaine and an I-125 radioactive seed, the 1.4 cm mass at the 1 o'clock position of the RIGHT breast 12 cm from the nipple containing biopsy clip was localized using a LATERAL approach. The follow-up 3D MLO mammograms confirm the seed in the expected location and were marked for Dr. Brantley Stage. Follow-up survey of the patient confirms presence of the radioactive seed. Order number of I-125 seed:  [id number]. Total activity:  5.621 millicuries.  Reference Date: 01/14/2018 The patient tolerated the procedure well and was released from the [facility]. She was given instructions regarding seed removal. IMPRESSION: Radioactive seed localization RIGHT breast. No apparent complications. Electronically Signed   By: Margarette Canada M.D.   On: 02/15/2018 14:19    ELIGIBLE FOR AVAILABLE RESEARCH PROTOCOL: BCEP  ASSESSMENT: 47 y.o.  [place], [place] woman status post right breast upper inner quadrant biopsy 12/29/2017, for a clinical T1c N0, stage IA invasive ductal carcinoma, triple positive, with an MIB-1 of  80%.  (1) genetics testing 02/02/2018 though the CancerNext gene panel  offered by [facility] showed no deleterious mutations in  APC, ATM, BARD1, BMPR1A,BRCA1, BRCA2, BRIP1, CDH1, CDK4, CDKN2A, CHEK2, DICER1, HOXB13, MLH1, MRE11A, MSH2, MSH6, MUTYH, NBN, NF1, PALB2, PMS2, POLD1, POLE, PTEN, RAD50, RAD51C, RAD51D, SMAD4, SMARCA4, STK11 and TP53 (sequencing and deletion/duplication); EPCAM and GREM1 (deletion/duplication only).  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>A) )   (2) right lumpectomy and sentinel lymph node sampling 02/16/2018 showed a pT2 pN0, stage IB invasive ductal carcinoma, grade 3, with a positive inferior margin; a total of 5 lymph nodes were removed  (a) additional surgery 02/27/2018 clear the margins  (3) to start carboplatin, docetaxel, trastuzumab and Pertuzumab 03/11/2018, repeat every 21 days x 6  (4) continue trastuzumab to total 6 months  (a) echocardiogram 01/21/2018 showed an ejection fraction in the 55-60% range  (5) adjuvant radiation to follow  (6) antiestrogens to start at the completion of local treatment  PLAN: I spent approximately 40 minutes face to face with Elyanna with more than 50% of that time spent in counseling and coordination of care.  We reviewed her final pathology, which showed a T2 grade 3 tumor.  We had originally planned to treat her with paclitaxel alone as far as chemotherapy was concerned but I do not believe that would be adequate given this information.  Accordingly today we discussed carboplatin and docetaxel in detail as well as trastuzumab and Pertuzumab.  She will receive all 4 drugs every 21 days x 6.  The anti-HER-2 treatment will be continued to total 6 months.  We reviewed her baseline echocardiogram which is very favorable.  We discussed the possible permanent toxicities from this chemo including permanent hair loss, permanent neuropathy, and permanent heart damage.  We also discussed other  possible toxicity side effects and complications and she also met with our chemotherapy teaching nurse today for further review.  I gave Jelitza a roadmap on how to take her supportive medications, showed her how to read it, and also entered all her supportive medication prescriptions today.  She will return for her first treatment 03/11/2018.  She will see [place] a week later to troubleshoot side effects.  We will then see her on day 1 and approximately day 8 of subsequent cycles  She has also been referred for the UPBEAT study.  I have strongly encouraged her to call us with any questions or concerns.    Maily Debarge, [name], MD  03/01/18 3:11 PM Medical Oncology and Hematology [facility] [address] [place], [place] [address] Tel. [phone]    Fax. [phone]  Alice Rieger, am acting as scribe for Chauncey Cruel MD.  I, Lurline Del MD, have reviewed the above documentation for accuracy and completeness, and I agree with the above.

## 2018-03-01 ENCOUNTER — Other Ambulatory Visit: Payer: Self-pay | Admitting: Hematology and Oncology

## 2018-03-01 ENCOUNTER — Inpatient Hospital Stay: Payer: Managed Care, Other (non HMO)

## 2018-03-01 ENCOUNTER — Other Ambulatory Visit: Payer: Self-pay | Admitting: Oncology

## 2018-03-01 ENCOUNTER — Inpatient Hospital Stay: Payer: Managed Care, Other (non HMO) | Attending: Oncology | Admitting: Oncology

## 2018-03-01 ENCOUNTER — Encounter: Payer: Self-pay | Admitting: *Deleted

## 2018-03-01 VITALS — BP 129/81 | HR 54 | Temp 99.1°F | Resp 18 | Ht 68.0 in | Wt 173.5 lb

## 2018-03-01 DIAGNOSIS — C50211 Malignant neoplasm of upper-inner quadrant of right female breast: Secondary | ICD-10-CM | POA: Diagnosis present

## 2018-03-01 DIAGNOSIS — Z5111 Encounter for antineoplastic chemotherapy: Secondary | ICD-10-CM | POA: Insufficient documentation

## 2018-03-01 DIAGNOSIS — Z170 Estrogen receptor positive status [ER+]: Secondary | ICD-10-CM | POA: Diagnosis not present

## 2018-03-01 DIAGNOSIS — Z803 Family history of malignant neoplasm of breast: Secondary | ICD-10-CM | POA: Insufficient documentation

## 2018-03-01 DIAGNOSIS — Z5112 Encounter for antineoplastic immunotherapy: Secondary | ICD-10-CM | POA: Insufficient documentation

## 2018-03-01 DIAGNOSIS — Z006 Encounter for examination for normal comparison and control in clinical research program: Secondary | ICD-10-CM

## 2018-03-01 DIAGNOSIS — Z801 Family history of malignant neoplasm of trachea, bronchus and lung: Secondary | ICD-10-CM | POA: Diagnosis not present

## 2018-03-01 DIAGNOSIS — Z800 Family history of malignant neoplasm of digestive organs: Secondary | ICD-10-CM | POA: Insufficient documentation

## 2018-03-01 MED ORDER — DEXAMETHASONE 4 MG PO TABS: 8.0000 mg | ORAL_TABLET | Freq: Two times a day (BID) | ORAL | 1 refills | Status: DC

## 2018-03-01 MED ORDER — LORAZEPAM 0.5 MG PO TABS: 0.5000 mg | ORAL_TABLET | Freq: Every evening | ORAL | 0 refills | Status: DC | PRN

## 2018-03-01 MED ORDER — LIDOCAINE-PRILOCAINE 2.5-2.5 % EX CREA: TOPICAL_CREAM | CUTANEOUS | 3 refills | Status: DC

## 2018-03-01 MED ORDER — PROCHLORPERAZINE MALEATE 10 MG PO TABS: 10.0000 mg | ORAL_TABLET | Freq: Four times a day (QID) | ORAL | 1 refills | Status: DC | PRN

## 2018-03-01 NOTE — Progress Notes (Signed)
DISCONTINUE ON PATHWAY REGIMEN - Breast   Paclitaxel Weekly + Trastuzumab Weekly:   Administer weekly:     Paclitaxel      Trastuzumab      Trastuzumab   **Always confirm dose/schedule in your pharmacy ordering system**  Trastuzumab (Maintenance - NO Loading Dose):   A cycle is every 21 days:     Trastuzumab   **Always confirm dose/schedule in your pharmacy ordering system**  REASON: Other Reason PRIOR TREATMENT: [id number]: Weekly Paclitaxel + Trastuzumab x 12 Weeks, Followed by Trastuzumab Maintenance q21 Days x 13 Cycles TREATMENT RESPONSE: Unable to Evaluate  START ON PATHWAY REGIMEN - Breast     A cycle is every 21 days:     Docetaxel      Carboplatin      Trastuzumab-xxxx      Trastuzumab-xxxx      Pertuzumab      Pertuzumab   **Always confirm dose/schedule in your pharmacy ordering system**  Patient Characteristics: Postoperative without Neoadjuvant Therapy (Pathologic Staging), Invasive Disease, Adjuvant Therapy, HER2 Positive, ER Positive, Node Negative, pT2 or Higher Therapeutic Status: Postoperative without Neoadjuvant Therapy (Pathologic Staging) AJCC Grade: GX AJCC N Category: pNX AJCC M Category: cM0 ER Status: Positive (+) AJCC 8 Stage Grouping: IB HER2 Status: Positive (+) Oncotype Dx Recurrence Score: Not Appropriate AJCC T Category: pTX PR Status: Positive (+) Intent of Therapy: Curative Intent, Discussed with Patient

## 2018-03-01 NOTE — Progress Notes (Signed)
DISCONTINUE ON PATHWAY REGIMEN - Breast     A cycle is every 21 days:     Docetaxel      Carboplatin      Trastuzumab-xxxx      Trastuzumab-xxxx      Pertuzumab      Pertuzumab   **Always confirm dose/schedule in your pharmacy ordering system**  REASON: Other Reason PRIOR TREATMENT: [id number]: Docetaxel + Carboplatin + Trastuzumab + Pertuzumab (TCHP) q21 Days x 6 Cycles, Followed by Trastuzumab + Pertuzumab q21 Days x 11 Cycles TREATMENT RESPONSE: Unable to Evaluate  START ON PATHWAY REGIMEN - Breast     A cycle is every 21 days:     Docetaxel      Carboplatin      Trastuzumab-xxxx      Trastuzumab-xxxx      Pertuzumab      Pertuzumab   **Always confirm dose/schedule in your pharmacy ordering system**  Patient Characteristics: Postoperative without Neoadjuvant Therapy (Pathologic Staging), Invasive Disease, Adjuvant Therapy, HER2 Positive, ER Positive, Node Negative, pT2 or Higher Therapeutic Status: Postoperative without Neoadjuvant Therapy (Pathologic Staging) AJCC Grade: GX AJCC N Category: pNX AJCC M Category: cM0 ER Status: Positive (+) AJCC 8 Stage Grouping: IB HER2 Status: Positive (+) Oncotype Dx Recurrence Score: Not Appropriate AJCC T Category: pTX PR Status: Positive (+) Intent of Therapy: Curative Intent, Discussed with Patient

## 2018-03-03 ENCOUNTER — Telehealth: Payer: Self-pay | Admitting: Oncology

## 2018-03-03 ENCOUNTER — Other Ambulatory Visit: Payer: Self-pay | Admitting: *Deleted

## 2018-03-03 DIAGNOSIS — Z170 Estrogen receptor positive status [ER+]: Secondary | ICD-10-CM

## 2018-03-03 DIAGNOSIS — C50211 Malignant neoplasm of upper-inner quadrant of right female breast: Secondary | ICD-10-CM

## 2018-03-03 NOTE — Telephone Encounter (Signed)
LVM for PT regarding upcoming aug appt updates per 8/20 sch message.

## 2018-03-04 ENCOUNTER — Telehealth: Payer: Self-pay | Admitting: Oncology

## 2018-03-04 NOTE — Telephone Encounter (Signed)
PT rescheduled per 8/23 sch message. Pt aware

## 2018-03-04 NOTE — Telephone Encounter (Signed)
"  Received message from Dr. [name] nurse about about the 03-08-2018 appointments.  This is my first time and would like to know if I need to use the cream on the port-a-cath."  With return call confirmed use of Emla cream applied to port an hour before access; covering cream with plastic for cream to remain topically for numbing effect.  Arrive thirty minutes before appointment for registration process.   Denies further questions or needs at this time.

## 2018-03-07 ENCOUNTER — Other Ambulatory Visit: Payer: Self-pay

## 2018-03-07 ENCOUNTER — Telehealth: Payer: Self-pay | Admitting: *Deleted

## 2018-03-07 ENCOUNTER — Encounter: Payer: Self-pay | Admitting: Physical Therapy

## 2018-03-07 ENCOUNTER — Ambulatory Visit: Payer: Managed Care, Other (non HMO) | Attending: Surgery | Admitting: Physical Therapy

## 2018-03-07 DIAGNOSIS — M25611 Stiffness of right shoulder, not elsewhere classified: Secondary | ICD-10-CM

## 2018-03-07 DIAGNOSIS — C50211 Malignant neoplasm of upper-inner quadrant of right female breast: Secondary | ICD-10-CM | POA: Diagnosis present

## 2018-03-07 DIAGNOSIS — Z170 Estrogen receptor positive status [ER+]: Secondary | ICD-10-CM | POA: Insufficient documentation

## 2018-03-07 DIAGNOSIS — R293 Abnormal posture: Secondary | ICD-10-CM | POA: Insufficient documentation

## 2018-03-07 DIAGNOSIS — Z483 Aftercare following surgery for neoplasm: Secondary | ICD-10-CM | POA: Diagnosis present

## 2018-03-07 NOTE — Therapy (Signed)
[place], [place], [address] Phone: [phone]   Fax:  [phone]  Physical Therapy Treatment  Patient Details  Name: Monica [name] MRN: [id number] Date of Birth: 10/01/1970 Referring Provider: Dr. [name]   Encounter Date: 03/07/2018  PT End of Session - 03/07/18 1201    Visit Number  2    Number of Visits  2    PT Start Time  1100    PT Stop Time  1202    PT Time Calculation (min)  62 min    Activity Tolerance  Patient tolerated treatment well       Past Medical History:  Diagnosis Date  . Arthritis    lower back  . Cancer [facility])    right breast cancer  . Family history of breast cancer     Past Surgical History:  Procedure Laterality Date  . ABDOMINAL HYSTERECTOMY    . BREAST LUMPECTOMY WITH RADIOACTIVE SEED AND SENTINEL LYMPH NODE BIOPSY Right 02/16/2018   Procedure: RIGHT BREAST LUMPECTOMY WITH RADIOACTIVE SEED AND SENTINEL LYMPH NODE BIOPSY;  Surgeon: [name], MD;  Location: [place];  Service: General;  Laterality: Right;  . HYMENECTOMY    . PORTACATH PLACEMENT Right 02/16/2018   Procedure: INSERTION PORT-A-CATH;  Surgeon: [name], MD;  Location: [place];  Service: General;  Laterality: Right;  . RE-EXCISION OF BREAST LUMPECTOMY Right 02/22/2018   Procedure: RE-EXCISION OF RIGHT  BREAST LUMPECTOMY;  Surgeon: [name], MD;  Location: [place];  Service: General;  Laterality: Right;  . REPAIR VAGINAL CUFF N/A 02/07/2017   Procedure: REPAIR VAGINAL CUFF;  Surgeon: Ena Dawley, MD;  Location: [facility];  Service: Gynecology;  Laterality: N/A;  . ROBOTIC ASSISTED TOTAL HYSTERECTOMY WITH SALPINGECTOMY Left 01/20/2017   Procedure: ROBOTIC ASSISTED TOTAL HYSTERECTOMY WITH SALPINGECTOMY;  Surgeon: [name], MD;  Location: [facility];  Service: Gynecology;  Laterality: Left;    There were no vitals filed for this  visit.  Subjective Assessment - 03/07/18 1102    Subjective  Patient underwent a right lumpectomy and sentinel node biopsy (0/5 positive) on 02/16/18 with right side port placement. Re-excision on 02/22/18 with clear margins. She will begin chemotherapy on 03/11/18 including Herceptin.    Pertinent History  Patient was diagnosed on 12/20/17 with right triple positive breast cancer. The Ki67 is 80%. She had a hysterectomy in 01/2017. Patient underwent a right lumpectomy and sentinel node biopsy (0/5 positive) on 02/16/18 with right side port placement.    Patient Stated Goals  Make sure my arm is ok    Currently in Pain?  Yes    Pain Score  7     Pain Location  Arm    Pain Orientation  Right;Upper    Pain Descriptors / Indicators  Burning    Pain Type  Neuropathic pain    Pain Onset  1 to 4 weeks ago    Pain Frequency  Intermittent    Aggravating Factors   Working    Pain Relieving Factors  Unknown    Multiple Pain Sites  No         [facility] PT Assessment - 03/07/18 0001      Assessment   Medical Diagnosis  s/p right lumpectomy and SLNB    Referring Provider  Dr. [name] Cornett    Onset Date/Surgical Date  02/16/18    Hand Dominance  Left    Prior Therapy  Baselines  Precautions   Precautions  Other (comment)    Precaution Comments  recent cancer surgery; right arm lymphedema risk      Restrictions   Weight Bearing Restrictions  No      Balance Screen   Has the patient fallen in the past 6 months  No    Has the patient had a decrease in activity level because of a fear of falling?   No    Is the patient reluctant to leave their home because of a fear of falling?   No      Home Environment   Living Environment  Private residence    Living Arrangements  Children   47 y.o. daughter   Available Help at Discharge  Family      Prior Function   Level of Independence  Independent    Vocation  Full time employment    Industrial/product designer    Leisure  She  walks daily for 30 min and does some UE weights 2-3x/week      Cognition   Overall Cognitive Status  Within Functional Limits for tasks assessed      Observation/Other Assessments   Observations  Right breast incision and port incision both appear to be healing well. Axillary incision has small opening that is draining clear fluid. Pt reports this fluid builds up and then when she massages it, fluid drains and pressure is released. No signs of infection present.      Posture/Postural Control   Posture/Postural Control  Postural limitations    Postural Limitations  Rounded Shoulders;Forward head      ROM / Strength   AROM / PROM / Strength  AROM      AROM   AROM Assessment Site  Shoulder    Right/Left Shoulder  Right    Right Shoulder Extension  40 Degrees    Right Shoulder Flexion  140 Degrees    Right Shoulder ABduction  162 Degrees    Right Shoulder Internal Rotation  70 Degrees    Right Shoulder External Rotation  85 Degrees        LYMPHEDEMA/ONCOLOGY QUESTIONNAIRE - 03/07/18 1112      Type   Cancer Type  Right breast cancer      Surgeries   Lumpectomy Date  02/16/18    Sentinel Lymph Node Biopsy Date  02/16/18    Other Surgery Date  02/22/18    Number Lymph Nodes Removed  5      Treatment   Active Chemotherapy Treatment  No    Past Chemotherapy Treatment  No    Active Radiation Treatment  No    Past Radiation Treatment  No    Current Hormone Treatment  No    Past Hormone Therapy  No      What other symptoms do you have   Are you Having Heaviness or Tightness  Yes    Are you having Pain  Yes    Are you having pitting edema  No    Is it Hard or Difficult finding clothes that fit  No    Do you have infections  No    Is there Decreased scar mobility  Yes    Stemmer Sign  No      Lymphedema Assessments   Lymphedema Assessments  Upper extremities      Right Upper Extremity Lymphedema   10 cm Proximal to Olecranon Process  26.9 cm    Olecranon Process  24.6 cm  10 cm Proximal to Ulnar Styloid Process  22 cm    Just Proximal to Ulnar Styloid Process  14.7 cm    Across Hand at [employer]  18.3 cm    At [place] of 2nd Digit  5.9 cm      Left Upper Extremity Lymphedema   10 cm Proximal to Olecranon Process  27.7 cm    Olecranon Process  25 cm    10 cm Proximal to Ulnar Styloid Process  21.8 cm    Just Proximal to Ulnar Styloid Process  14.6 cm    Across Hand at [employer]  19 cm    At [place] of 2nd Digit  5.9 cm        Quick Dash - 03/07/18 0001    Open a tight or new jar  Moderate difficulty    Do heavy household chores (wash walls, wash floors)  Moderate difficulty    Carry a shopping bag or briefcase  Mild difficulty    Wash your back  Mild difficulty    Use a knife to cut food  No difficulty    Recreational activities in which you take some force or impact through your arm, shoulder, or hand (golf, hammering, tennis)  Mild difficulty    During the past week, to what extent has your arm, shoulder or hand problem interfered with your normal social activities with family, friends, neighbors, or groups?  Not at all    During the past week, to what extent has your arm, shoulder or hand problem limited your work or other regular daily activities  Slightly    Arm, shoulder, or hand pain.  Moderate    Tingling (pins and needles) in your arm, shoulder, or hand  Moderate    Difficulty Sleeping  No difficulty    DASH Score  27.27 %             [facility] Adult PT Treatment/Exercise - 03/07/18 0001      Manual Therapy   Manual therapy comments  Applied Allevyn pad to right axillary region and then issued compression foam in a stockinette to be placed between sports br and skin.             PT Education - 03/07/18 1201    Education Details  Neural stretching    Person(s) Educated  Patient    Methods  Explanation    Comprehension  Verbalized understanding;Returned demonstration          PT Long Term Goals - 03/07/18 1223       PT LONG TERM GOAL #1   Title  Patient will demonstrate she has returned to basleine related to shoulder ROM and function.    Time  8    Period  Weeks    Status  Achieved            Plan - 03/07/18 1213    Clinical Impression Statement  Patient is doing well s/p right lumpectomy and sentinel node biopsy (0/5 nodes positive) on 02/16/18 and then a re-excision to obtain clear margins on 02/22/18. She has regained nearly all shoulder ROM and function but is continuing to drain from her axillary incision. She has some neural tension in her right arm but that will likely resolve in time and with some stretches issued today. She will attend the After Breast Cancer class for lymphedema education but has no other PT needs at this time.    PT Treatment/Interventions  ADLs/Self Care Home Management;Therapeutic  exercise;Patient/family education    PT Next Visit Plan  D/C    PT Home Exercise Plan  Post op shoulder ROM HEP and neural stretches    Consulted and Agree with Plan of Care  Patient       Patient will benefit from skilled therapeutic intervention in order to improve the following deficits and impairments:  Decreased knowledge of precautions, Impaired UE functional use, Decreased range of motion, Postural dysfunction, Pain, Increased edema  Visit Diagnosis: Malignant neoplasm of upper-inner quadrant of right breast in female, estrogen receptor positive (HCC)  Abnormal posture  Aftercare following surgery for neoplasm  Stiffness of right shoulder, not elsewhere classified     Problem List Patient Active Problem List   Diagnosis Date Noted  . Genetic testing 02/03/2018  . Family history of breast cancer 01/12/2018  . Malignant neoplasm of upper-inner quadrant of right breast in female, estrogen receptor positive ([place]) 01/05/2018  . Vaginal bleeding 02/07/2017  . Anemia 02/07/2017  . Orthostasis 02/07/2017  . S/P laparoscopic hysterectomy 01/20/2017   PHYSICAL THERAPY  DISCHARGE SUMMARY  Visits from Start of Care: 2  Current functional level related to goals / functional outcomes: Goals met. Patient doing very well with arm function and no signs of lymphedema.   Remaining deficits: Small open area at axilla incision causing drainage. Some neural tension right arm.   Education / Equipment: HEP Plan: Patient agrees to discharge.  Patient goals were met. Patient is being discharged due to meeting the stated rehab goals.  ?????       Annia Friendly, [place] 03/07/18 12:25 PM   [place], [place], [address] Phone: [phone]   Fax:  [phone]  Name: Mirna Sutcliffe MRN: [id number] Date of Birth: Jan 27, 1971

## 2018-03-07 NOTE — Patient Instructions (Addendum)
MEDIAN NERVE: Mobilization XI    Stand with right palm flat on wall, fingers back, elbow bent, head tilted away. Sidestep away from wall, straightening elbow. Do _1_ sets of __3_ repetitions per session, holding for 5 seconds. Do _2__ sessions per day.  Copyright  VHI. All rights reserved.  Neurovascular: Ulnar Nerve Stretch - Supine    Lie with neck supported, right arm out to side, elbow bent to pain-free position, fingers pointing toward ear, palm up. Slowly bend elbow further, as far as possible without pain. Hold __5__ seconds. Repeat __3__ times per set. Do _1___ sets per session. Do __2__ sessions per week.  Copyright  VHI. All rights reserved.

## 2018-03-07 NOTE — Telephone Encounter (Signed)
  Called pt to reminder her of labs for 03/08/18 @ 8:45 and to fast 3 hours prior to her lab draw.

## 2018-03-08 ENCOUNTER — Inpatient Hospital Stay: Payer: Managed Care, Other (non HMO)

## 2018-03-08 ENCOUNTER — Ambulatory Visit (HOSPITAL_COMMUNITY)
Admission: RE | Admit: 2018-03-08 | Discharge: 2018-03-08 | Disposition: A | Discharge status: Home / Self Care | Payer: Managed Care, Other (non HMO) | Source: Ambulatory Visit | Attending: Oncology | Admitting: Oncology

## 2018-03-08 ENCOUNTER — Other Ambulatory Visit: Payer: Managed Care, Other (non HMO)

## 2018-03-08 ENCOUNTER — Other Ambulatory Visit: Payer: Self-pay | Admitting: *Deleted

## 2018-03-08 ENCOUNTER — Inpatient Hospital Stay: Payer: Managed Care, Other (non HMO) | Admitting: *Deleted

## 2018-03-08 ENCOUNTER — Encounter: Payer: Managed Care, Other (non HMO) | Admitting: *Deleted

## 2018-03-08 DIAGNOSIS — Z170 Estrogen receptor positive status [ER+]: Secondary | ICD-10-CM

## 2018-03-08 DIAGNOSIS — Z5111 Encounter for antineoplastic chemotherapy: Secondary | ICD-10-CM | POA: Diagnosis not present

## 2018-03-08 DIAGNOSIS — C50211 Malignant neoplasm of upper-inner quadrant of right female breast: Secondary | ICD-10-CM

## 2018-03-08 DIAGNOSIS — Z95828 Presence of other vascular implants and grafts: Secondary | ICD-10-CM

## 2018-03-08 DIAGNOSIS — Z006 Encounter for examination for normal comparison and control in clinical research program: Secondary | ICD-10-CM

## 2018-03-08 LAB — RESEARCH LABS

## 2018-03-08 MED ORDER — SODIUM CHLORIDE 0.9% FLUSH
10.0000 mL | Freq: Once | INTRAVENOUS | Status: AC
  Administered 2018-03-08: 10 mL
  Filled 2018-03-08: qty 10

## 2018-03-08 MED ORDER — HEPARIN SOD (PORK) LOCK FLUSH 100 UNIT/ML IV SOLN
500.0000 [IU] | Freq: Once | INTRAVENOUS | Status: AC
  Administered 2018-03-08: 500 [IU]
  Filled 2018-03-08: qty 5

## 2018-03-10 ENCOUNTER — Ambulatory Visit: Payer: Managed Care, Other (non HMO)

## 2018-03-10 ENCOUNTER — Other Ambulatory Visit: Payer: Managed Care, Other (non HMO)

## 2018-03-11 ENCOUNTER — Other Ambulatory Visit: Payer: Managed Care, Other (non HMO)

## 2018-03-11 ENCOUNTER — Inpatient Hospital Stay: Payer: Managed Care, Other (non HMO)

## 2018-03-11 ENCOUNTER — Encounter: Payer: Self-pay | Admitting: *Deleted

## 2018-03-11 VITALS — BP 133/88 | HR 66 | Temp 98.0°F | Resp 18 | Ht 67.5 in | Wt 173.5 lb

## 2018-03-11 DIAGNOSIS — C50211 Malignant neoplasm of upper-inner quadrant of right female breast: Secondary | ICD-10-CM

## 2018-03-11 DIAGNOSIS — Z5111 Encounter for antineoplastic chemotherapy: Secondary | ICD-10-CM | POA: Diagnosis not present

## 2018-03-11 DIAGNOSIS — Z170 Estrogen receptor positive status [ER+]: Secondary | ICD-10-CM

## 2018-03-11 MED ORDER — TRASTUZUMAB CHEMO 150 MG IV SOLR
8.0000 mg/kg | Freq: Once | INTRAVENOUS | Status: AC
  Administered 2018-03-11: 630 mg via INTRAVENOUS
  Filled 2018-03-11: qty 30

## 2018-03-11 MED ORDER — DIPHENHYDRAMINE HCL 25 MG PO CAPS
ORAL_CAPSULE | ORAL | Status: AC
  Filled 2018-03-11: qty 1

## 2018-03-11 MED ORDER — HEPARIN SOD (PORK) LOCK FLUSH 100 UNIT/ML IV SOLN
500.0000 [IU] | Freq: Once | INTRAVENOUS | Status: AC | PRN
  Administered 2018-03-11: 500 [IU]
  Filled 2018-03-11: qty 5

## 2018-03-11 MED ORDER — PALONOSETRON HCL INJECTION 0.25 MG/5ML
0.2500 mg | Freq: Once | INTRAVENOUS | Status: AC
  Administered 2018-03-11: 0.25 mg via INTRAVENOUS

## 2018-03-11 MED ORDER — ACETAMINOPHEN 325 MG PO TABS
650.0000 mg | ORAL_TABLET | Freq: Once | ORAL | Status: AC
  Administered 2018-03-11: 650 mg via ORAL

## 2018-03-11 MED ORDER — SODIUM CHLORIDE 0.9% FLUSH
10.0000 mL | INTRAVENOUS | Status: DC | PRN
  Administered 2018-03-11: 10 mL
  Filled 2018-03-11: qty 10

## 2018-03-11 MED ORDER — PEGFILGRASTIM 6 MG/0.6ML ~~LOC~~ PSKT
6.0000 mg | PREFILLED_SYRINGE | Freq: Once | SUBCUTANEOUS | Status: AC
  Administered 2018-03-11: 6 mg via SUBCUTANEOUS

## 2018-03-11 MED ORDER — SODIUM CHLORIDE 0.9 % IV SOLN
695.0000 mg | Freq: Once | INTRAVENOUS | Status: AC
  Administered 2018-03-11: 700 mg via INTRAVENOUS
  Filled 2018-03-11: qty 70

## 2018-03-11 MED ORDER — SODIUM CHLORIDE 0.9 % IV SOLN
75.0000 mg/m2 | Freq: Once | INTRAVENOUS | Status: AC
  Administered 2018-03-11: 140 mg via INTRAVENOUS
  Filled 2018-03-11: qty 14

## 2018-03-11 MED ORDER — ACETAMINOPHEN 325 MG PO TABS
ORAL_TABLET | ORAL | Status: AC
  Filled 2018-03-11: qty 2

## 2018-03-11 MED ORDER — SODIUM CHLORIDE 0.9 % IV SOLN
Freq: Once | INTRAVENOUS | Status: AC
  Administered 2018-03-11: 14:00:00 via INTRAVENOUS
  Filled 2018-03-11: qty 5

## 2018-03-11 MED ORDER — PALONOSETRON HCL INJECTION 0.25 MG/5ML
INTRAVENOUS | Status: AC
  Filled 2018-03-11: qty 5

## 2018-03-11 MED ORDER — SODIUM CHLORIDE 0.9 % IV SOLN
Freq: Once | INTRAVENOUS | Status: AC
  Administered 2018-03-11: 09:00:00 via INTRAVENOUS
  Filled 2018-03-11: qty 250

## 2018-03-11 MED ORDER — PEGFILGRASTIM 6 MG/0.6ML ~~LOC~~ PSKT
PREFILLED_SYRINGE | SUBCUTANEOUS | Status: AC
  Filled 2018-03-11: qty 0.6

## 2018-03-11 MED ORDER — DIPHENHYDRAMINE HCL 25 MG PO CAPS
25.0000 mg | ORAL_CAPSULE | Freq: Once | ORAL | Status: AC
  Administered 2018-03-11: 25 mg via ORAL

## 2018-03-11 MED ORDER — SODIUM CHLORIDE 0.9 % IV SOLN
420.0000 mg | Freq: Once | INTRAVENOUS | Status: AC
  Administered 2018-03-11: 420 mg via INTRAVENOUS
  Filled 2018-03-11: qty 14

## 2018-03-11 NOTE — Progress Notes (Signed)
Per Dr. Jana Hakim: OK to treat with labs from 02/14/18

## 2018-03-11 NOTE — Patient Instructions (Addendum)
Monica [name] Discharge Instructions for Patients Receiving Chemotherapy  Today you received the following chemotherapy agents: Trastuzumab (Herceptin), Pertuzumab (Perjeta), Docetaxel (Taxotere), and Carboplatin (Paraplatin)  To help prevent nausea and vomiting after your treatment, we encourage you to take your nausea medication as directed. Received Aloxi during treatment today-->Take Compazine (not Zofran) for the next 3 days as needed.   If you develop nausea and vomiting that is not controlled by your nausea medication, call the clinic.   BELOW ARE SYMPTOMS THAT SHOULD BE REPORTED IMMEDIATELY:  *FEVER GREATER THAN 100.5 F  *CHILLS WITH OR WITHOUT FEVER  NAUSEA AND VOMITING THAT IS NOT CONTROLLED WITH YOUR NAUSEA MEDICATION  *UNUSUAL SHORTNESS OF BREATH  *UNUSUAL BRUISING OR BLEEDING  TENDERNESS IN MOUTH AND THROAT WITH OR WITHOUT PRESENCE OF ULCERS  *URINARY PROBLEMS  *BOWEL PROBLEMS  UNUSUAL RASH Items with * indicate a potential emergency and should be followed up as soon as possible.  Feel free to call the clinic should you have any questions or concerns. The clinic phone number is [phone].  Please show the [facility] at check-in to the Emergency Department and triage nurse.  Trastuzumab injection for infusion What is this medicine? TRASTUZUMAB (tras TOO zoo mab) is a monoclonal antibody. It is used to treat breast cancer and stomach cancer. This medicine may be used for other purposes; ask your health care provider or pharmacist if you have questions. COMMON BRAND NAME(S): Herceptin What should I tell my health care provider before I take this medicine? They need to know if you have any of these conditions: -heart disease -heart failure -lung or breathing disease, like asthma -an unusual or allergic reaction to trastuzumab, benzyl alcohol, or other medications, foods, dyes, or preservatives -pregnant or trying to get  pregnant -breast-feeding How should I use this medicine? This drug is given as an infusion into a vein. It is administered in a hospital or clinic by a specially trained health care professional. Talk to your pediatrician regarding the use of this medicine in children. This medicine is not approved for use in children. Overdosage: If you think you have taken too much of this medicine contact a poison control center or emergency room at once. NOTE: This medicine is only for you. Do not share this medicine with others. What if I miss a dose? It is important not to miss a dose. Call your doctor or health care professional if you are unable to keep an appointment. What may interact with this medicine? This medicine may interact with the following medications: -certain types of chemotherapy, such as daunorubicin, doxorubicin, epirubicin, and idarubicin This list may not describe all possible interactions. Give your health care provider a list of all the medicines, herbs, non-prescription drugs, or dietary supplements you use. Also tell them if you smoke, drink alcohol, or use illegal drugs. Some items may interact with your medicine. What should I watch for while using this medicine? Visit your doctor for checks on your progress. Report any side effects. Continue your course of treatment even though you feel ill unless your doctor tells you to stop. Call your doctor or health care professional for advice if you get a fever, chills or sore throat, or other symptoms of a cold or flu. Do not treat yourself. Try to avoid being around people who are sick. You may experience fever, chills and shaking during your first infusion. These effects are usually mild and can be treated with other medicines. Report any side effects  during the infusion to your health care professional. Fever and chills usually do not happen with later infusions. Do not become pregnant while taking this medicine or for 7 months after  stopping it. Women should inform their doctor if they wish to become pregnant or think they might be pregnant. Women of child-bearing potential will need to have a negative pregnancy test before starting this medicine. There is a potential for serious side effects to an unborn child. Talk to your health care professional or pharmacist for more information. Do not breast-feed an infant while taking this medicine or for 7 months after stopping it. Women must use effective birth control with this medicine. What side effects may I notice from receiving this medicine? Side effects that you should report to your doctor or health care professional as soon as possible: -allergic reactions like skin rash, itching or hives, swelling of the face, lips, or tongue -chest pain or palpitations -cough -dizziness -feeling faint or lightheaded, falls -fever -general ill feeling or flu-like symptoms -signs of worsening heart failure like breathing problems; swelling in your legs and feet -unusually weak or tired Side effects that usually do not require medical attention (report to your doctor or health care professional if they continue or are bothersome): -bone pain -changes in taste -diarrhea -joint pain -nausea/vomiting -weight loss This list may not describe all possible side effects. Call your doctor for medical advice about side effects. You may report side effects to FDA at 1-800-FDA-1088. Where should I keep my medicine? This drug is given in a hospital or clinic and will not be stored at home. NOTE: This sheet is a summary. It may not cover all possible information. If you have questions about this medicine, talk to your doctor, pharmacist, or health care provider.  2018 Elsevier/Gold Standard (2016-06-23 14:37:52)  Pertuzumab injection What is this medicine? PERTUZUMAB (per TOOZ ue mab) is a monoclonal antibody. It is used to treat breast cancer. This medicine may be used for other purposes; ask  your health care provider or pharmacist if you have questions. COMMON BRAND NAME(S): PERJETA What should I tell my health care provider before I take this medicine? They need to know if you have any of these conditions: -heart disease -heart failure -high blood pressure -history of irregular heart beat -recent or ongoing radiation therapy -an unusual or allergic reaction to pertuzumab, other medicines, foods, dyes, or preservatives -pregnant or trying to get pregnant -breast-feeding How should I use this medicine? This medicine is for infusion into a vein. It is given by a health care professional in a hospital or clinic setting. Talk to your pediatrician regarding the use of this medicine in children. Special care may be needed. Overdosage: If you think you have taken too much of this medicine contact a poison control center or emergency room at once. NOTE: This medicine is only for you. Do not share this medicine with others. What if I miss a dose? It is important not to miss your dose. Call your doctor or health care professional if you are unable to keep an appointment. What may interact with this medicine? Interactions are not expected. Give your health care provider a list of all the medicines, herbs, non-prescription drugs, or dietary supplements you use. Also tell them if you smoke, drink alcohol, or use illegal drugs. Some items may interact with your medicine. This list may not describe all possible interactions. Give your health care provider a list of all the medicines, herbs, non-prescription drugs, or  dietary supplements you use. Also tell them if you smoke, drink alcohol, or use illegal drugs. Some items may interact with your medicine. What should I watch for while using this medicine? Your condition will be monitored carefully while you are receiving this medicine. Report any side effects. Continue your course of treatment even though you feel ill unless your doctor tells you  to stop. Do not become pregnant while taking this medicine or for 7 months after stopping it. Women should inform their doctor if they wish to become pregnant or think they might be pregnant. Women of child-bearing potential will need to have a negative pregnancy test before starting this medicine. There is a potential for serious side effects to an unborn child. Talk to your health care professional or pharmacist for more information. Do not breast-feed an infant while taking this medicine or for 7 months after stopping it. Women must use effective birth control with this medicine. Call your doctor or health care professional for advice if you get a fever, chills or sore throat, or other symptoms of a cold or flu. Do not treat yourself. Try to avoid being around people who are sick. You may experience fever, chills, and headache during the infusion. Report any side effects during the infusion to your health care professional. What side effects may I notice from receiving this medicine? Side effects that you should report to your doctor or health care professional as soon as possible: -breathing problems -chest pain or palpitations -dizziness -feeling faint or lightheaded -fever or chills -skin rash, itching or hives -sore throat -swelling of the face, lips, or tongue -swelling of the legs or ankles -unusually weak or tired Side effects that usually do not require medical attention (report to your doctor or health care professional if they continue or are bothersome): -diarrhea -hair loss -nausea, vomiting -tiredness This list may not describe all possible side effects. Call your doctor for medical advice about side effects. You may report side effects to FDA at 1-800-FDA-1088. Where should I keep my medicine? This drug is given in a hospital or clinic and will not be stored at home. NOTE: This sheet is a summary. It may not cover all possible information. If you have questions about this  medicine, talk to your doctor, pharmacist, or health care provider.  2018 Elsevier/Gold Standard (2015-08-01 12:08:50)  Docetaxel injection What is this medicine? DOCETAXEL (doe se TAX el) is a chemotherapy drug. It targets fast dividing cells, like cancer cells, and causes these cells to die. This medicine is used to treat many types of cancers like breast cancer, certain stomach cancers, head and neck cancer, lung cancer, and prostate cancer. This medicine may be used for other purposes; ask your health care provider or pharmacist if you have questions. COMMON BRAND NAME(S): Docefrez, Taxotere What should I tell my health care provider before I take this medicine? They need to know if you have any of these conditions: -infection (especially a virus infection such as chickenpox, cold sores, or herpes) -liver disease -low blood counts, like low white cell, platelet, or red cell counts -an unusual or allergic reaction to docetaxel, polysorbate 80, other chemotherapy agents, other medicines, foods, dyes, or preservatives -pregnant or trying to get pregnant -breast-feeding How should I use this medicine? This drug is given as an infusion into a vein. It is administered in a hospital or clinic by a specially trained health care professional. Talk to your pediatrician regarding the use of this medicine in children. Special  care may be needed. Overdosage: If you think you have taken too much of this medicine contact a poison control center or emergency room at once. NOTE: This medicine is only for you. Do not share this medicine with others. What if I miss a dose? It is important not to miss your dose. Call your doctor or health care professional if you are unable to keep an appointment. What may interact with this medicine? -cyclosporine -erythromycin -ketoconazole -medicines to increase blood counts like filgrastim, pegfilgrastim, sargramostim -vaccines Talk to your doctor or health care  professional before taking any of these medicines: -acetaminophen -aspirin -ibuprofen -ketoprofen -naproxen This list may not describe all possible interactions. Give your health care provider a list of all the medicines, herbs, non-prescription drugs, or dietary supplements you use. Also tell them if you smoke, drink alcohol, or use illegal drugs. Some items may interact with your medicine. What should I watch for while using this medicine? Your condition will be monitored carefully while you are receiving this medicine. You will need important blood work done while you are taking this medicine. This drug may make you feel generally unwell. This is not uncommon, as chemotherapy can affect healthy cells as well as cancer cells. Report any side effects. Continue your course of treatment even though you feel ill unless your doctor tells you to stop. In some cases, you may be given additional medicines to help with side effects. Follow all directions for their use. Call your doctor or health care professional for advice if you get a fever, chills or sore throat, or other symptoms of a cold or flu. Do not treat yourself. This drug decreases your body's ability to fight infections. Try to avoid being around people who are sick. This medicine may increase your risk to bruise or bleed. Call your doctor or health care professional if you notice any unusual bleeding. This medicine may contain alcohol in the product. You may get drowsy or dizzy. Do not drive, use machinery, or do anything that needs mental alertness until you know how this medicine affects you. Do not stand or sit up quickly, especially if you are an older patient. This reduces the risk of dizzy or fainting spells. Avoid alcoholic drinks. Do not become pregnant while taking this medicine. Women should inform their doctor if they wish to become pregnant or think they might be pregnant. There is a potential for serious side effects to an unborn  child. Talk to your health care professional or pharmacist for more information. Do not breast-feed an infant while taking this medicine. What side effects may I notice from receiving this medicine? Side effects that you should report to your doctor or health care professional as soon as possible: -allergic reactions like skin rash, itching or hives, swelling of the face, lips, or tongue -low blood counts - This drug may decrease the number of white blood cells, red blood cells and platelets. You may be at increased risk for infections and bleeding. -signs of infection - fever or chills, cough, sore throat, pain or difficulty passing urine -signs of decreased platelets or bleeding - bruising, pinpoint red spots on the skin, black, tarry stools, nosebleeds -signs of decreased red blood cells - unusually weak or tired, fainting spells, lightheadedness -breathing problems -fast or irregular heartbeat -low blood pressure -mouth sores -nausea and vomiting -pain, swelling, redness or irritation at the injection site -pain, tingling, numbness in the hands or feet -swelling of the ankle, feet, hands -weight gain Side effects  that usually do not require medical attention (report to your doctor or health care professional if they continue or are bothersome): -bone pain -complete hair loss including hair on your head, underarms, pubic hair, eyebrows, and eyelashes -diarrhea -excessive tearing -changes in the color of fingernails -loosening of the fingernails -nausea -muscle pain -red flush to skin -sweating -weak or tired This list may not describe all possible side effects. Call your doctor for medical advice about side effects. You may report side effects to FDA at 1-800-FDA-1088. Where should I keep my medicine? This drug is given in a hospital or clinic and will not be stored at home. NOTE: This sheet is a summary. It may not cover all possible information. If you have questions about this  medicine, talk to your doctor, pharmacist, or health care provider.  2018 Elsevier/Gold Standard (2015-08-01 12:32:56)  Carboplatin injection What is this medicine? CARBOPLATIN (KAR boe pla tin) is a chemotherapy drug. It targets fast dividing cells, like cancer cells, and causes these cells to die. This medicine is used to treat ovarian cancer and many other cancers. This medicine may be used for other purposes; ask your health care provider or pharmacist if you have questions. COMMON BRAND NAME(S): Paraplatin What should I tell my health care provider before I take this medicine? They need to know if you have any of these conditions: -blood disorders -hearing problems -kidney disease -recent or ongoing radiation therapy -an unusual or allergic reaction to carboplatin, cisplatin, other chemotherapy, other medicines, foods, dyes, or preservatives -pregnant or trying to get pregnant -breast-feeding How should I use this medicine? This drug is usually given as an infusion into a vein. It is administered in a hospital or clinic by a specially trained health care professional. Talk to your pediatrician regarding the use of this medicine in children. Special care may be needed. Overdosage: If you think you have taken too much of this medicine contact a poison control center or emergency room at once. NOTE: This medicine is only for you. Do not share this medicine with others. What if I miss a dose? It is important not to miss a dose. Call your doctor or health care professional if you are unable to keep an appointment. What may interact with this medicine? -medicines for seizures -medicines to increase blood counts like filgrastim, pegfilgrastim, sargramostim -some antibiotics like amikacin, gentamicin, neomycin, streptomycin, tobramycin -vaccines Talk to your doctor or health care professional before taking any of these  medicines: -acetaminophen -aspirin -ibuprofen -ketoprofen -naproxen This list may not describe all possible interactions. Give your health care provider a list of all the medicines, herbs, non-prescription drugs, or dietary supplements you use. Also tell them if you smoke, drink alcohol, or use illegal drugs. Some items may interact with your medicine. What should I watch for while using this medicine? Your condition will be monitored carefully while you are receiving this medicine. You will need important blood work done while you are taking this medicine. This drug may make you feel generally unwell. This is not uncommon, as chemotherapy can affect healthy cells as well as cancer cells. Report any side effects. Continue your course of treatment even though you feel ill unless your doctor tells you to stop. In some cases, you may be given additional medicines to help with side effects. Follow all directions for their use. Call your doctor or health care professional for advice if you get a fever, chills or sore throat, or other symptoms of a cold or flu.  Do not treat yourself. This drug decreases your body's ability to fight infections. Try to avoid being around people who are sick. This medicine may increase your risk to bruise or bleed. Call your doctor or health care professional if you notice any unusual bleeding. Be careful brushing and flossing your teeth or using a toothpick because you may get an infection or bleed more easily. If you have any dental work done, tell your dentist you are receiving this medicine. Avoid taking products that contain aspirin, acetaminophen, ibuprofen, naproxen, or ketoprofen unless instructed by your doctor. These medicines may hide a fever. Do not become pregnant while taking this medicine. Women should inform their doctor if they wish to become pregnant or think they might be pregnant. There is a potential for serious side effects to an unborn child. Talk to  your health care professional or pharmacist for more information. Do not breast-feed an infant while taking this medicine. What side effects may I notice from receiving this medicine? Side effects that you should report to your doctor or health care professional as soon as possible: -allergic reactions like skin rash, itching or hives, swelling of the face, lips, or tongue -signs of infection - fever or chills, cough, sore throat, pain or difficulty passing urine -signs of decreased platelets or bleeding - bruising, pinpoint red spots on the skin, black, tarry stools, nosebleeds -signs of decreased red blood cells - unusually weak or tired, fainting spells, lightheadedness -breathing problems -changes in hearing -changes in vision -chest pain -high blood pressure -low blood counts - This drug may decrease the number of white blood cells, red blood cells and platelets. You may be at increased risk for infections and bleeding. -nausea and vomiting -pain, swelling, redness or irritation at the injection site -pain, tingling, numbness in the hands or feet -problems with balance, talking, walking -trouble passing urine or change in the amount of urine Side effects that usually do not require medical attention (report to your doctor or health care professional if they continue or are bothersome): -hair loss -loss of appetite -metallic taste in the mouth or changes in taste This list may not describe all possible side effects. Call your doctor for medical advice about side effects. You may report side effects to FDA at 1-800-FDA-1088. Where should I keep my medicine? This drug is given in a hospital or clinic and will not be stored at home. NOTE: This sheet is a summary. It may not cover all possible information. If you have questions about this medicine, talk to your doctor, pharmacist, or health care provider.  2018 Elsevier/Gold Standard (2007-10-04 14:38:05)

## 2018-03-17 ENCOUNTER — Other Ambulatory Visit: Payer: Self-pay | Admitting: *Deleted

## 2018-03-17 ENCOUNTER — Other Ambulatory Visit: Payer: Managed Care, Other (non HMO)

## 2018-03-17 ENCOUNTER — Telehealth: Payer: Self-pay | Admitting: *Deleted

## 2018-03-17 ENCOUNTER — Ambulatory Visit: Payer: Managed Care, Other (non HMO)

## 2018-03-17 DIAGNOSIS — C50211 Malignant neoplasm of upper-inner quadrant of right female breast: Secondary | ICD-10-CM

## 2018-03-17 DIAGNOSIS — Z170 Estrogen receptor positive status [ER+]: Secondary | ICD-10-CM

## 2018-03-17 NOTE — Telephone Encounter (Signed)
Monica [name] [phone]), questions about symptoms, side effects, Ibuprofen use, appetite and eating challenges.    "I'm having the following symptoms that started after the Neulasta injection.  Severe cramping, N/V, diarrhea.  Two Imodium AD Tuesday night with first diarrhea stool.  Repeated with the next two stools which I'm glad has controlled diarrhea because can only use four.  Had only one stool last night.    Not much of an appetite.  Everything taste disgusting, braces catch my lip.  I almost want to have braces removed.  Doing chemotherapy with braces at the same time is hard.   Used Claritin and Tylenol.  Can I take ibuprofen for pain?  It works better for me.   Feel like I have the flu.  My neck and back hurt.  Eyes burning like when you have a cold.  No cold symptoms.  Low grade temperature ranges 99.4.  Bleeding somewhere in my nose.  See blood on tissue when I blow.  My nose keeps running.  Extreme fatigue.  After an hour or two of doing anything, I then need to rest."    Routing call information for provider review.  Return call with any Provider orders or further instructions.     Reviewed anti-diarrheal foods, mouth care, Ibuprofen noted on medication list, saline nasal spray or anti-bacterial ointment may help moisturize nasal area, rest if needed only be sure to walk around office and home a few times daily.   Monica [name] reports eating yogurt, care package with ginger and peppermint candies and surgeon ordered ibuprofen.

## 2018-03-17 NOTE — Progress Notes (Signed)
Monica [name]  [phone] [phone]     ID: Monica [name] DOB: 1971-06-08  MR#: [id number]  NOM#:[id number]  Patient Care Team: [name], PA-C as PCP - General (Family Medicine) [name], MD as Consulting Physician (General Surgery) [name], [name], MD as Consulting Physician (Oncology) [name], MD as Consulting Physician (Radiation Oncology) [name], MD as Consulting Physician (Obstetrics and Gynecology) OTHER MD: [facility]  CHIEF COMPLAINT: triple positive breast cancer  CURRENT TREATMENT: adjuvant chemo-immunotherapy   HISTORY OF CURRENT ILLNESS: From the original intake note:  Monica [name] (pronounced "uric") palpated a right supraclavicular mass and felt tenderness after stretching one day. She brought this to her PA's attention.  The patient underwent bilateral diagnostic mammography with tomography and right breast ultrasonography at [facility] on 12/20/2017 showing: breast density category C. There was a suspicious palpable mass at the 1 o'clock position upper inner quadrant measuring 1.4 x 1.3 x 1.3 cm located 12 cm from the nipple. There was an additional mass at the 8 o'clock radiant measuring 1.2 x 1.0 x 1.4 cm, which likely represented a cyst. Ultrasound evaluation of the right axilla demonstrates no evidence of lymphadenopathy  Accordingly on 12/29/2017 she proceeded to biopsy of the right breast area in question. The pathology from this procedure showed ([id number]): Invasive ductal carcinoma, grade III. Prognostic indicators significant for: estrogen receptor, 80% positive and progesterone receptor, 70% positive, both with strong staining intensity. Proliferation marker Ki67 at 80%. HER2 amplified with ratios HER2/CEP17 signals 3.44 and average HER2 copies per cell 10.65.  The patient's subsequent history is as detailed below.  INTERVAL HISTORY: Monica [name] returns today for follow up and  treatment of her triple positive breast cancer accompanied by her mother-in-law, Monica [name]. She started adjuvant chemotherapy on 03/11/2018 with carboplatin, docetaxel, trastuzumab, and pertuzumab given every 21 days x 6 cycles.  Today is day 8 cycle 1. She tolerated this generally well. She felt fine up until day 5, where later in the evening, she had abdominal bloating and distention. She had significant issues with diarrhea through the night. On day 6, she took off from work to rest. She had about 1-2 more episodes of diarrhea. On day 7, she felt slightly better with only 1 episode of loose bowels. She took Imodium to help with diarrhea.  She is also having fatigue. She denies nausea. She has dry nose and mouth, but no mouth sores. Instead her braces stick to the inside of her mouth. She has an altered sense of taste.     REVIEW OF SYSTEMS: [name] reports that  She has some mild neck pain and takes Ibuprofen about every 3-4 hours to help. She feels fatigued, but she tries to get up during the day to stay active. Due to her issues of diarrhea she has lost about 10 lbs over the last week. She tries to eat healthy meals, but her sense of taste is metallic. She has a rash on the back of both ears, which she attributes to shaving the back of her neck. She also has a rash on her chest in sun-exposed areas. She stayed in her home the last 2 days and rested over Labor Day weekend. She occasionally has clear fluid drainage from the right axilla. She denies unusual headaches, visual changes, nausea, vomiting, or dizziness. There has been no unusual cough, phlegm production, or pleurisy. There has been no change in bladder habits. She denies or unexplained weight loss, bleeding, or fever. A detailed review  of systems was otherwise stable.    PAST MEDICAL HISTORY: Past Medical History:  Diagnosis Date  . Arthritis    lower back  . Cancer [facility])    right breast cancer  . Family history of breast cancer   She  notes that she had a heart murmur as a child, which she grew out of.   PAST SURGICAL HISTORY: Past Surgical History:  Procedure Laterality Date  . ABDOMINAL HYSTERECTOMY    . BREAST LUMPECTOMY WITH RADIOACTIVE SEED AND SENTINEL LYMPH NODE BIOPSY Right 02/16/2018   Procedure: RIGHT BREAST LUMPECTOMY WITH RADIOACTIVE SEED AND SENTINEL LYMPH NODE BIOPSY;  Surgeon: [name], MD;  Location: [facility];  Service: General;  Laterality: Right;  . HYMENECTOMY    . PORTACATH PLACEMENT Right 02/16/2018   Procedure: INSERTION PORT-A-CATH;  Surgeon: [name], MD;  Location: [facility];  Service: General;  Laterality: Right;  . RE-EXCISION OF BREAST LUMPECTOMY Right 02/22/2018   Procedure: RE-EXCISION OF RIGHT  BREAST LUMPECTOMY;  Surgeon: [name], MD;  Location: [facility];  Service: General;  Laterality: Right;  . REPAIR VAGINAL CUFF N/A 02/07/2017   Procedure: REPAIR VAGINAL CUFF;  Surgeon: Ena Dawley, MD;  Location: [facility];  Service: Gynecology;  Laterality: N/A;  . ROBOTIC ASSISTED TOTAL HYSTERECTOMY WITH SALPINGECTOMY Left 01/20/2017   Procedure: ROBOTIC ASSISTED TOTAL HYSTERECTOMY WITH SALPINGECTOMY;  Surgeon: [name], MD;  Location: [facility];  Service: Gynecology;  Laterality: Left;  Partial Hysterectomy without BSO  FAMILY HISTORY Family History  Problem Relation Age of Onset  . Lung cancer Maternal Grandfather   . Esophageal cancer Paternal Grandfather 10  . Breast cancer Cousin 33  As of July 2019, the patient's father is alive at 47. The patient's mother is also alive at 42. The patient has 1 brother and 5 sisters. There was a maternal grandfather diagnosed with lung cancer at 53. There was a paternal grandfather diagnosed with esophageal cancer at 40. The patient's father had a pre-cancerous esophageal finding at age 17. There was also a maternal 1st cousin diagnosed with metastatic breast cancer at age 30.    GYNECOLOGIC  HISTORY:  Patient's last menstrual period was 12/21/2016 (exact date). Menarche: 47 years old Age at first live birth: 47 years old She is GXP2. She is status post partial hysterectomy without BSO in 2018 She never used HRT. She used oral contraception over 21 years ago with no complications.   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 The patient is separated from her husband, [name]. The patient's son [name] age 61 is in the [employer] stationed in [place] in the Horticulturist, commercial. He plans on working in the railroad industry after he returns. The patient's daughter [name] age 24, works at [employer].      ADVANCED DIRECTIVES: Not in place   HEALTH MAINTENANCE: Social History   Tobacco Use  . Smoking status: Never Smoker  . Smokeless tobacco: Never Used  Substance Use Topics  . Alcohol use: Yes    Comment: occ  . Drug use: No     Colonoscopy:   PAP: 2018/ prior to hysterectomy  Bone density:   No Known Allergies  Current Outpatient Medications  Medication Sig Dispense Refill  . dexamethasone (DECADRON) 4 MG tablet Take 2 tablets (8 mg total) by mouth 2 (two) times daily. Start the day before Taxotere. Take once the day after, then 2 times a day x 2d. Fielding  tablet 1  . ibuprofen (ADVIL,MOTRIN) 800 MG tablet Take 1 tablet (800 mg total) by mouth every 8 (eight) hours as needed. 30 tablet 0  . lidocaine-prilocaine (EMLA) cream Apply to affected area once 30 g 3  . LORazepam (ATIVAN) 0.5 MG tablet Take 1 tablet (0.5 mg total) by mouth at bedtime as needed (Nausea or vomiting). 30 tablet 0  . oxyCODONE (OXY IR/ROXICODONE) 5 MG immediate release tablet Take 1 tablet (5 mg total) by mouth every 6 (six) hours as needed for severe pain. 15 tablet 0  . prochlorperazine (COMPAZINE) 10 MG tablet Take 1 tablet (10 mg total) by mouth every 6 (six) hours as needed (Nausea or vomiting). 30 tablet 1  . fluconazole (DIFLUCAN) 100 MG tablet  Take 1 tablet (100 mg total) by mouth daily. 20 tablet 1   No current facility-administered medications for this visit.     OBJECTIVE: Middle-aged white woman in no acute distress  Vitals:   03/18/18 1233  BP: 116/78  Pulse: 82  Resp: 18  Temp: 98.5 F (36.9 C)  SpO2: 100%     Body mass index is 25.35 kg/m.   Wt Readings from Last 3 Encounters:  03/18/18 164 lb 4 oz (74.5 kg)  03/11/18 173 lb 8 oz (78.7 kg)  03/08/18 172 lb 8 oz (78.2 kg)      ECOG FS:0 - Asymptomatic  Sclerae unicteric, pupils round and equal Oropharynx shows mild thrush No cervical or supraclavicular adenopathy Lungs no rales or rhonchi Heart regular rate and rhythm Abd soft, nontender, positive bowel sounds MSK no focal spinal tenderness, no upper extremity lymphedema Neuro: nonfocal, well oriented, appropriate affect Breasts: The right breast is status post lumpectomy and axillary lymph node dissection.  The axillary incision is minimally dehisced.  There are 2 sites each about 3 mm, and very shallow.  There is no surrounding erythema.  Left breast is benign.  Left axilla is benign.  LAB RESULTS:  CMP     Component Value Date/Time   NA 138 03/18/2018 1217   K 4.6 03/18/2018 1217   CL 102 03/18/2018 1217   CO2 28 03/18/2018 1217   GLUCOSE 110 (H) 03/18/2018 1217   BUN 11 03/18/2018 1217   CREATININE 0.90 03/18/2018 1217   CALCIUM 9.8 03/18/2018 1217   PROT 7.3 03/18/2018 1217   ALBUMIN 4.0 03/18/2018 1217   AST 18 03/18/2018 1217   ALT 30 03/18/2018 1217   ALKPHOS 74 03/18/2018 1217   BILITOT 0.3 03/18/2018 1217   GFRNONAA >60 03/18/2018 1217   GFRAA >60 03/18/2018 1217    No results found for: TOTALPROTELP, ALBUMINELP, A1GS, A2GS, BETS, BETA2SER, GAMS, MSPIKE, SPEI  No results found for: KPAFRELGTCHN, LAMBDASER, KAPLAMBRATIO  Lab Results  Component Value Date   WBC 29.0 (H) 03/18/2018   NEUTROABS PENDING 03/18/2018   HGB 14.2 03/18/2018   HCT 43.4 03/18/2018   MCV 91.0  03/18/2018   PLT 262 03/18/2018    _0 @  No results found for: LABCA2  No components found for: XIHWTU882  No results for input(s): INR in the last 168 hours.  No results found for: LABCA2  No results found for: CMK349  No results found for: ZPH150  No results found for: VWP794  No results found for: CA2729  No components found for: HGQUANT  No results found for: CEA1 / No results found for: CEA1   No results found for: AFPTUMOR  No results found for: Artois  No results found for: PSA1  Appointment  on 03/18/2018  Component Date Value Ref Range Status  . Sodium 03/18/2018 138  135 - 145 mmol/L Final  . Potassium 03/18/2018 4.6  3.5 - 5.1 mmol/L Final  . Chloride 03/18/2018 102  98 - 111 mmol/L Final  . CO2 03/18/2018 28  22 - 32 mmol/L Final  . Glucose, Bld 03/18/2018 110* 70 - 99 mg/dL Final  . BUN 03/18/2018 11  6 - 20 mg/dL Final  . Creatinine 03/18/2018 0.90  0.44 - 1.00 mg/dL Final  . Calcium 03/18/2018 9.8  8.9 - 10.3 mg/dL Final  . Total Protein 03/18/2018 7.3  6.5 - 8.1 g/dL Final  . Albumin 03/18/2018 4.0  3.5 - 5.0 g/dL Final  . AST 03/18/2018 18  15 - 41 U/L Final  . ALT 03/18/2018 30  0 - 44 U/L Final  . Alkaline Phosphatase 03/18/2018 74  38 - 126 U/L Final  . Total Bilirubin 03/18/2018 0.3  0.3 - 1.2 mg/dL Final  . GFR, Est Non Af Am 03/18/2018 >60  >60 mL/min Final  . GFR, Est AFR Am 03/18/2018 >60  >60 mL/min Final   Comment: (NOTE) The eGFR has been calculated using the CKD EPI equation. This calculation has not been validated in all clinical situations. eGFR's persistently <60 mL/min signify possible Chronic Kidney Disease.   Georgiann Hahn gap 03/18/2018 8  5 - 15 Final   Performed at [facility], [place] [address], [place], [place] [address]  . WBC Count 03/18/2018 29.0* 3.9 - 10.3 K/uL Final  . RBC 03/18/2018 4.77  3.70 - 5.45 MIL/uL Final  . Hemoglobin 03/18/2018 14.2  11.6 - 15.9 g/dL Final  . HCT  03/18/2018 43.4  34.8 - 46.6 % Final  . MCV 03/18/2018 91.0  79.5 - 101.0 fL Final  . MCH 03/18/2018 29.7  25.1 - 34.0 pg Final  . MCHC 03/18/2018 32.7  31.5 - 36.0 g/dL Final  . RDW 03/18/2018 12.3  11.2 - 14.5 % Final  . Platelet Count 03/18/2018 262  145 - 400 K/uL Final   Performed at [facility], [place] [address], [place], [place] [address]  . Neutrophils Relative % 03/18/2018 PENDING  % Incomplete  . Neutro Abs 03/18/2018 PENDING  1.7 - 7.7 K/uL Incomplete  . Band Neutrophils 03/18/2018 PENDING  % Incomplete  . Lymphocytes Relative 03/18/2018 PENDING  % Incomplete  . Lymphs Abs 03/18/2018 PENDING  0.7 - 4.0 K/uL Incomplete  . Monocytes Relative 03/18/2018 PENDING  % Incomplete  . Monocytes Absolute 03/18/2018 PENDING  0.1 - 1.0 K/uL Incomplete  . Eosinophils Relative 03/18/2018 PENDING  % Incomplete  . Eosinophils Absolute 03/18/2018 PENDING  0.0 - 0.7 K/uL Incomplete  . Basophils Relative 03/18/2018 PENDING  % Incomplete  . Basophils Absolute 03/18/2018 PENDING  0.0 - 0.1 K/uL Incomplete  . WBC Morphology 03/18/2018 PENDING   Incomplete  . RBC Morphology 03/18/2018 PENDING   Incomplete  . Smear Review 03/18/2018 PENDING   Incomplete  . Other 03/18/2018 PENDING  % Incomplete  . nRBC 03/18/2018 PENDING  0 /100 WBC Incomplete  . Metamyelocytes Relative 03/18/2018 PENDING  % Incomplete  . Myelocytes 03/18/2018 PENDING  % Incomplete  . Promyelocytes Relative 03/18/2018 PENDING  % Incomplete  . Blasts 03/18/2018 PENDING  % Incomplete    (this displays the last labs from the last 3 days)  No results found for: TOTALPROTELP, ALBUMINELP, A1GS, A2GS, BETS, BETA2SER, GAMS, MSPIKE, SPEI (this displays SPEP labs)  No results found for: KPAFRELGTCHN,  LAMBDASER, KAPLAMBRATIO (kappa/lambda light chains)  No results found for: HGBA, HGBA2QUANT, HGBFQUANT, HGBSQUAN (Hemoglobinopathy evaluation)   No results found for: LDH  No results found for: IRON, TIBC,  IRONPCTSAT (Iron and TIBC)  No results found for: FERRITIN  Urinalysis No results found for: COLORURINE, APPEARANCEUR, LABSPEC, PHURINE, GLUCOSEU, HGBUR, BILIRUBINUR, KETONESUR, PROTEINUR, UROBILINOGEN, NITRITE, LEUKOCYTESUR   STUDIES: No results found.  ELIGIBLE FOR AVAILABLE RESEARCH PROTOCOL: BCEP  ASSESSMENT: 47 y.o.  [place], [place] woman status post right breast upper inner quadrant biopsy 12/29/2017, for a clinical T1c N0, stage IA invasive ductal carcinoma, triple positive, with an MIB-1 of 80%.  (1) genetics testing 02/02/2018 though the CancerNext gene panel offered by [facility] showed no deleterious mutations in  APC, ATM, BARD1, BMPR1A,BRCA1, BRCA2, BRIP1, CDH1, CDK4, CDKN2A, CHEK2, DICER1, HOXB13, MLH1, MRE11A, MSH2, MSH6, MUTYH, NBN, NF1, PALB2, PMS2, POLD1, POLE, PTEN, RAD50, RAD51C, RAD51D, SMAD4, SMARCA4, STK11 and TP53 (sequencing and deletion/duplication); EPCAM and GREM1 (deletion/duplication only).  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>A) )   (2) right lumpectomy and sentinel lymph node sampling 02/16/2018 showed a pT2 pN0, stage IB invasive ductal carcinoma, grade 3, with a positive inferior margin; a total of 5 lymph nodes were removed  (a) additional surgery 02/27/2018 clear the margins  (3) started carboplatin, docetaxel, trastuzumab and Pertuzumab 03/11/2018, to be repeated every 21 days x 6  (4) continue trastuzumab to total 6 months  (a) echocardiogram 01/21/2018 showed an ejection fraction in the 55-60% range  (5) adjuvant radiation to follow  (6) antiestrogens to start at the completion of local treatment  PLAN: Rosary did generally okay with her first cycle but she had significant problems most of them due to the epratuzumab.  We are going to omit that from subsequent cycles.  She has a slight rash and she has some thrush.  I think both will be treated with Diflucan.  I told her how to take it and I suggested when  she receives her second cycle she started Diflucan on day 1 and take it daily for 5 days.  Hopefully that will prevent the same thing from happening in the future.  She is preparing herself for losing her hair.  She is already having some scalp tingling.  She does awaken place but mostly she is planning to wear hats but that is.  We discussed the taste perversion.  Possibly some of it is due to thrush in which case it will improve.  More likely it is due to the chemotherapy itself and in that case it is going to continue for at least 2 or 3 months after she finishes the chemotherapy.  I suggested she try some drops of vinegar or lemon juice on her food and see if that helps.  She is also adding a little bit of salt to the food and that might help as well  As far as the pain from the shot is concerned I suggested she use Aleve plus Tylenol 3 times a day with food and use the oxycodone as needed for breakthrough pain.  Otherwise she will return in a couple of weeks for her second cycle.  She knows to call for any other issues that may develop before the next visit.   , [name], MD  03/18/18 1:09 PM Medical Oncology and Hematology [facility] [address] [place], [place] [address] Tel. [phone]    Fax. [phone]  Alice Rieger, am acting as scribe  for Chauncey Cruel MD.  I, Lurline Del MD, have reviewed the above documentation for accuracy and completeness, and I agree with the above.

## 2018-03-18 ENCOUNTER — Ambulatory Visit: Payer: Managed Care, Other (non HMO)

## 2018-03-18 ENCOUNTER — Inpatient Hospital Stay: Payer: Managed Care, Other (non HMO) | Attending: Oncology

## 2018-03-18 ENCOUNTER — Inpatient Hospital Stay (HOSPITAL_BASED_OUTPATIENT_CLINIC_OR_DEPARTMENT_OTHER): Payer: Managed Care, Other (non HMO) | Admitting: Oncology

## 2018-03-18 VITALS — BP 116/78 | HR 82 | Temp 98.5°F | Resp 18 | Wt 164.2 lb

## 2018-03-18 DIAGNOSIS — R21 Rash and other nonspecific skin eruption: Secondary | ICD-10-CM | POA: Diagnosis not present

## 2018-03-18 DIAGNOSIS — Z170 Estrogen receptor positive status [ER+]: Secondary | ICD-10-CM

## 2018-03-18 DIAGNOSIS — Z803 Family history of malignant neoplasm of breast: Secondary | ICD-10-CM | POA: Diagnosis not present

## 2018-03-18 DIAGNOSIS — Z5111 Encounter for antineoplastic chemotherapy: Secondary | ICD-10-CM | POA: Insufficient documentation

## 2018-03-18 DIAGNOSIS — B370 Candidal stomatitis: Secondary | ICD-10-CM | POA: Insufficient documentation

## 2018-03-18 DIAGNOSIS — Z5112 Encounter for antineoplastic immunotherapy: Secondary | ICD-10-CM | POA: Insufficient documentation

## 2018-03-18 DIAGNOSIS — M542 Cervicalgia: Secondary | ICD-10-CM | POA: Diagnosis not present

## 2018-03-18 DIAGNOSIS — R634 Abnormal weight loss: Secondary | ICD-10-CM | POA: Diagnosis not present

## 2018-03-18 DIAGNOSIS — R197 Diarrhea, unspecified: Secondary | ICD-10-CM | POA: Diagnosis not present

## 2018-03-18 DIAGNOSIS — C50211 Malignant neoplasm of upper-inner quadrant of right female breast: Secondary | ICD-10-CM | POA: Diagnosis present

## 2018-03-18 LAB — CBC WITH DIFFERENTIAL (CANCER CENTER ONLY)
Band Neutrophils: 39 %
Basophils Absolute: 0 10*3/uL (ref 0.0–0.1)
Basophils Relative: 0 %
Blasts: 0 %
Eosinophils Absolute: 0 10*3/uL (ref 0.0–0.5)
Eosinophils Relative: 0 %
HCT: 43.4 % (ref 34.8–46.6)
Hemoglobin: 14.2 g/dL (ref 11.6–15.9)
Lymphocytes Relative: 7 %
Lymphs Abs: 2 10*3/uL (ref 0.9–3.3)
MCH: 29.7 pg (ref 25.1–34.0)
MCHC: 32.7 g/dL (ref 31.5–36.0)
MCV: 91 fL (ref 79.5–101.0)
Metamyelocytes Relative: 9 %
Monocytes Absolute: 2 10*3/uL — ABNORMAL HIGH (ref 0.1–0.9)
Monocytes Relative: 7 %
Myelocytes: 0 %
Neutro Abs: 25 10*3/uL — ABNORMAL HIGH (ref 1.5–6.5)
Neutrophils Relative %: 38 %
Other: 0 %
Platelet Count: 262 10*3/uL (ref 145–400)
Promyelocytes Relative: 0 %
RBC: 4.77 MIL/uL (ref 3.70–5.45)
RDW: 12.3 % (ref 11.2–14.5)
WBC Count: 29 10*3/uL — ABNORMAL HIGH (ref 3.9–10.3)
nRBC: 0 /100 WBC

## 2018-03-18 LAB — CMP (CANCER CENTER ONLY)
ALT: 30 U/L (ref 0–44)
AST: 18 U/L (ref 15–41)
Albumin: 4 g/dL (ref 3.5–5.0)
Alkaline Phosphatase: 74 U/L (ref 38–126)
Anion gap: 8 (ref 5–15)
BUN: 11 mg/dL (ref 6–20)
CO2: 28 mmol/L (ref 22–32)
Calcium: 9.8 mg/dL (ref 8.9–10.3)
Chloride: 102 mmol/L (ref 98–111)
Creatinine: 0.9 mg/dL (ref 0.44–1.00)
GFR, Est AFR Am: >60 mL/min (ref >60)
GFR, Estimated: >60 mL/min (ref >60)
Glucose, Bld: 110 mg/dL — ABNORMAL HIGH (ref 70–99)
Potassium: 4.6 mmol/L (ref 3.5–5.1)
Sodium: 138 mmol/L (ref 135–145)
Total Bilirubin: 0.3 mg/dL (ref 0.3–1.2)
Total Protein: 7.3 g/dL (ref 6.5–8.1)

## 2018-03-18 MED ORDER — FLUCONAZOLE 100 MG PO TABS: 100.0000 mg | ORAL_TABLET | Freq: Every day | ORAL | 1 refills | Status: DC

## 2018-03-21 ENCOUNTER — Telehealth: Payer: Self-pay | Admitting: Oncology

## 2018-03-21 NOTE — Telephone Encounter (Signed)
Per 9/6 los, no new orders.  °

## 2018-03-23 ENCOUNTER — Other Ambulatory Visit: Payer: Self-pay | Admitting: Oncology

## 2018-03-31 ENCOUNTER — Other Ambulatory Visit: Payer: Self-pay | Admitting: *Deleted

## 2018-03-31 DIAGNOSIS — Z170 Estrogen receptor positive status [ER+]: Secondary | ICD-10-CM

## 2018-03-31 DIAGNOSIS — C50211 Malignant neoplasm of upper-inner quadrant of right female breast: Secondary | ICD-10-CM

## 2018-04-01 ENCOUNTER — Other Ambulatory Visit: Payer: Managed Care, Other (non HMO)

## 2018-04-01 ENCOUNTER — Inpatient Hospital Stay (HOSPITAL_BASED_OUTPATIENT_CLINIC_OR_DEPARTMENT_OTHER): Payer: Managed Care, Other (non HMO) | Admitting: Adult Health

## 2018-04-01 ENCOUNTER — Encounter: Payer: Self-pay | Admitting: Adult Health

## 2018-04-01 ENCOUNTER — Telehealth: Payer: Self-pay | Admitting: Adult Health

## 2018-04-01 ENCOUNTER — Encounter: Payer: Self-pay | Admitting: *Deleted

## 2018-04-01 ENCOUNTER — Inpatient Hospital Stay: Payer: Managed Care, Other (non HMO)

## 2018-04-01 ENCOUNTER — Ambulatory Visit: Payer: Managed Care, Other (non HMO)

## 2018-04-01 VITALS — BP 133/83 | HR 74 | Temp 98.6°F | Resp 17 | Ht 67.5 in | Wt 164.1 lb

## 2018-04-01 DIAGNOSIS — C50211 Malignant neoplasm of upper-inner quadrant of right female breast: Secondary | ICD-10-CM

## 2018-04-01 DIAGNOSIS — Z800 Family history of malignant neoplasm of digestive organs: Secondary | ICD-10-CM

## 2018-04-01 DIAGNOSIS — R143 Flatulence: Secondary | ICD-10-CM | POA: Diagnosis not present

## 2018-04-01 DIAGNOSIS — Z5112 Encounter for antineoplastic immunotherapy: Secondary | ICD-10-CM | POA: Diagnosis not present

## 2018-04-01 DIAGNOSIS — Z170 Estrogen receptor positive status [ER+]: Secondary | ICD-10-CM

## 2018-04-01 DIAGNOSIS — Z801 Family history of malignant neoplasm of trachea, bronchus and lung: Secondary | ICD-10-CM

## 2018-04-01 DIAGNOSIS — K121 Other forms of stomatitis: Secondary | ICD-10-CM | POA: Diagnosis not present

## 2018-04-01 DIAGNOSIS — Z803 Family history of malignant neoplasm of breast: Secondary | ICD-10-CM

## 2018-04-01 LAB — CBC WITH DIFFERENTIAL (CANCER CENTER ONLY)
Basophils Absolute: 0 10*3/uL (ref 0.0–0.1)
Basophils Relative: 0 %
Eosinophils Absolute: 0 10*3/uL (ref 0.0–0.5)
Eosinophils Relative: 0 %
HCT: 39.8 % (ref 34.8–46.6)
Hemoglobin: 12.9 g/dL (ref 11.6–15.9)
Lymphocytes Relative: 3 %
Lymphs Abs: 0.4 10*3/uL — ABNORMAL LOW (ref 0.9–3.3)
MCH: 29.6 pg (ref 25.1–34.0)
MCHC: 32.4 g/dL (ref 31.5–36.0)
MCV: 91.3 fL (ref 79.5–101.0)
Monocytes Absolute: 0.1 10*3/uL (ref 0.1–0.9)
Monocytes Relative: 1 %
Neutro Abs: 11.6 10*3/uL — ABNORMAL HIGH (ref 1.5–6.5)
Neutrophils Relative %: 96 %
Platelet Count: 323 10*3/uL (ref 145–400)
RBC: 4.36 MIL/uL (ref 3.70–5.45)
RDW: 12.4 % (ref 11.2–14.5)
WBC Count: 12.2 10*3/uL — ABNORMAL HIGH (ref 3.9–10.3)

## 2018-04-01 LAB — CMP (CANCER CENTER ONLY)
ALT: 17 U/L (ref 0–44)
AST: 16 U/L (ref 15–41)
Albumin: 4.1 g/dL (ref 3.5–5.0)
Alkaline Phosphatase: 71 U/L (ref 38–126)
Anion gap: 11 (ref 5–15)
BUN: 16 mg/dL (ref 6–20)
CO2: 23 mmol/L (ref 22–32)
Calcium: 9.8 mg/dL (ref 8.9–10.3)
Chloride: 106 mmol/L (ref 98–111)
Creatinine: 0.83 mg/dL (ref 0.44–1.00)
GFR, Est AFR Am: >60 mL/min (ref >60)
GFR, Estimated: >60 mL/min (ref >60)
Glucose, Bld: 182 mg/dL — ABNORMAL HIGH (ref 70–99)
Potassium: 4.1 mmol/L (ref 3.5–5.1)
Sodium: 140 mmol/L (ref 135–145)
Total Bilirubin: 0.4 mg/dL (ref 0.3–1.2)
Total Protein: 7.4 g/dL (ref 6.5–8.1)

## 2018-04-01 MED ORDER — PALONOSETRON HCL INJECTION 0.25 MG/5ML
0.2500 mg | Freq: Once | INTRAVENOUS | Status: AC
  Administered 2018-04-01: 0.25 mg via INTRAVENOUS

## 2018-04-01 MED ORDER — DIPHENHYDRAMINE HCL 25 MG PO CAPS
25.0000 mg | ORAL_CAPSULE | Freq: Once | ORAL | Status: AC
  Administered 2018-04-01: 25 mg via ORAL

## 2018-04-01 MED ORDER — SODIUM CHLORIDE 0.9 % IV SOLN
Freq: Once | INTRAVENOUS | Status: AC
  Administered 2018-04-01: 09:00:00 via INTRAVENOUS
  Filled 2018-04-01: qty 250

## 2018-04-01 MED ORDER — PEGFILGRASTIM 6 MG/0.6ML ~~LOC~~ PSKT
PREFILLED_SYRINGE | SUBCUTANEOUS | Status: AC
  Filled 2018-04-01: qty 0.6

## 2018-04-01 MED ORDER — SODIUM CHLORIDE 0.9 % IV SOLN
640.0000 mg | Freq: Once | INTRAVENOUS | Status: AC
  Administered 2018-04-01: 640 mg via INTRAVENOUS
  Filled 2018-04-01: qty 64

## 2018-04-01 MED ORDER — ACETAMINOPHEN 325 MG PO TABS
650.0000 mg | ORAL_TABLET | Freq: Once | ORAL | Status: AC
  Administered 2018-04-01: 650 mg via ORAL

## 2018-04-01 MED ORDER — SODIUM CHLORIDE 0.9 % IV SOLN
Freq: Once | INTRAVENOUS | Status: AC
  Administered 2018-04-01: 10:00:00 via INTRAVENOUS
  Filled 2018-04-01: qty 5

## 2018-04-01 MED ORDER — DIPHENHYDRAMINE HCL 25 MG PO CAPS
ORAL_CAPSULE | ORAL | Status: AC
  Filled 2018-04-01: qty 1

## 2018-04-01 MED ORDER — MAGIC MOUTHWASH W/LIDOCAINE: 5.0000 mL | Freq: Four times a day (QID) | ORAL | 1 refills | Status: DC | PRN

## 2018-04-01 MED ORDER — HEPARIN SOD (PORK) LOCK FLUSH 100 UNIT/ML IV SOLN
500.0000 [IU] | Freq: Once | INTRAVENOUS | Status: AC | PRN
  Administered 2018-04-01: 500 [IU]
  Filled 2018-04-01: qty 5

## 2018-04-01 MED ORDER — SODIUM CHLORIDE 0.9% FLUSH
10.0000 mL | INTRAVENOUS | Status: DC | PRN
  Administered 2018-04-01: 10 mL
  Filled 2018-04-01: qty 10

## 2018-04-01 MED ORDER — PEGFILGRASTIM 6 MG/0.6ML ~~LOC~~ PSKT
6.0000 mg | PREFILLED_SYRINGE | Freq: Once | SUBCUTANEOUS | Status: AC
  Administered 2018-04-01: 6 mg via SUBCUTANEOUS

## 2018-04-01 MED ORDER — SODIUM CHLORIDE 0.9 % IV SOLN
75.0000 mg/m2 | Freq: Once | INTRAVENOUS | Status: AC
  Administered 2018-04-01: 140 mg via INTRAVENOUS
  Filled 2018-04-01: qty 14

## 2018-04-01 MED ORDER — PALONOSETRON HCL INJECTION 0.25 MG/5ML
INTRAVENOUS | Status: AC
  Filled 2018-04-01: qty 5

## 2018-04-01 MED ORDER — ACETAMINOPHEN 325 MG PO TABS
ORAL_TABLET | ORAL | Status: AC
  Filled 2018-04-01: qty 2

## 2018-04-01 MED ORDER — TRASTUZUMAB CHEMO 150 MG IV SOLR
450.0000 mg | Freq: Once | INTRAVENOUS | Status: AC
  Administered 2018-04-01: 450 mg via INTRAVENOUS
  Filled 2018-04-01: qty 21.43

## 2018-04-01 NOTE — Telephone Encounter (Signed)
Per 9/20 los, no orders.

## 2018-04-01 NOTE — Progress Notes (Signed)
[place]  [phone] [phone]) [phone]     ID: [name] DOB: [birth year]  MR#: [id number]  ZHG#:[id number]  Patient Care Team: [name], PA-C as PCP - General (Family Medicine) [name], MD as Consulting Physician (General Surgery) Magrinat, [name], MD as Consulting Physician (Oncology) [name], MD as Consulting Physician (Radiation Oncology) [name], MD as Consulting Physician (Obstetrics and Gynecology) OTHER MD: [facility]  CHIEF COMPLAINT: triple positive breast cancer  CURRENT TREATMENT: adjuvant chemo-immunotherapy   HISTORY OF CURRENT ILLNESS: From the original intake note:  Monica [name] (pronounced "uric") palpated a right supraclavicular mass and felt tenderness after stretching one day. She brought this to her PA's attention.  The patient underwent bilateral diagnostic mammography with tomography and right breast ultrasonography at [facility] on 12/20/2017 showing: breast density category C. There was a suspicious palpable mass at the 1 o'clock position upper inner quadrant measuring 1.4 x 1.3 x 1.3 cm located 12 cm from the nipple. There was an additional mass at the 8 o'clock radiant measuring 1.2 x 1.0 x 1.4 cm, which likely represented a cyst. Ultrasound evaluation of the right axilla demonstrates no evidence of lymphadenopathy  Accordingly on 12/29/2017 she proceeded to biopsy of the right breast area in question. The pathology from this procedure showed ([id number]): Invasive ductal carcinoma, grade III. Prognostic indicators significant for: estrogen receptor, 80% positive and progesterone receptor, 70% positive, both with strong staining intensity. Proliferation marker Ki67 at 80%. HER2 amplified with ratios HER2/CEP17 signals 3.44 and average HER2 copies per cell 10.65.  The patient's subsequent history is as detailed below.  INTERVAL HISTORY: Monica [name] returns today for follow up and  treatment of her triple positive breast cancer accompanied by her mother-in-law, Monica [name]. She started adjuvant chemotherapy on 03/11/2018 with carboplatin, docetaxel, trastuzumab, and pertuzumab given every 21 days x 6 cycles. (Pertuzumab omitted after cycle 1 due to diarrhea).  Today is day 1 cycle 2.   REVIEW OF SYSTEMS: Monica [name] is doing well today.  She is feeling much better than she did 2 weeks ago when she saw Dr. Jana Hakim in follow up.  She does note an ulcer under her left lateral tongue.  She has tried many home remedies to alleviate this, and hasn't found anything that has helped.  She does feel more energized and less fatigued.  Her diarrhea has resolved.  She notes that she took both of her dexamethasone tablets closely together last night and she had difficulty sleeping. After taking the dexamethasone she notes some gassiness in her abdomen.  She notes that she has a tiny intermittent tingle in the very tip her left second toe.  There is no other numbness or tingling in the tips of any other digits today.  Monica [name] denies any fever, chills, unusual headaches, vision changes, watering eyes.  She is without chest pain, palpitations, shortness of breath, cough.  She notes she continues to have right axilla seroma with her incision slightly open that is leaking fluids.  The fluid she describes as a clear yellow fluid and she notes that the volume is slowly decreasing.  She denies any pain in the area.  Otherwise a detailed ROS was conducted and is non contributory today.     PAST MEDICAL HISTORY: Past Medical History:  Diagnosis Date  . Arthritis    lower back  . Cancer [facility])    right breast cancer  . Family history of breast cancer   She notes that she had a heart  murmur as a child, which she grew out of.   PAST SURGICAL HISTORY: Past Surgical History:  Procedure Laterality Date  . ABDOMINAL HYSTERECTOMY    . BREAST LUMPECTOMY WITH RADIOACTIVE SEED AND SENTINEL LYMPH NODE BIOPSY  Right 02/16/2018   Procedure: RIGHT BREAST LUMPECTOMY WITH RADIOACTIVE SEED AND SENTINEL LYMPH NODE BIOPSY;  Surgeon: [name], MD;  Location: [place];  Service: General;  Laterality: Right;  . HYMENECTOMY    . PORTACATH PLACEMENT Right 02/16/2018   Procedure: INSERTION PORT-A-CATH;  Surgeon: [name], MD;  Location: [place];  Service: General;  Laterality: Right;  . RE-EXCISION OF BREAST LUMPECTOMY Right 02/22/2018   Procedure: RE-EXCISION OF RIGHT  BREAST LUMPECTOMY;  Surgeon: [name], MD;  Location: [place];  Service: General;  Laterality: Right;  . REPAIR VAGINAL CUFF N/A 02/07/2017   Procedure: REPAIR VAGINAL CUFF;  Surgeon: Ena Dawley, MD;  Location: [facility];  Service: Gynecology;  Laterality: N/A;  . ROBOTIC ASSISTED TOTAL HYSTERECTOMY WITH SALPINGECTOMY Left 01/20/2017   Procedure: ROBOTIC ASSISTED TOTAL HYSTERECTOMY WITH SALPINGECTOMY;  Surgeon: [name], MD;  Location: [facility];  Service: Gynecology;  Laterality: Left;  Partial Hysterectomy without BSO  FAMILY HISTORY Family History  Problem Relation Age of Onset  . Lung cancer Maternal Grandfather   . Esophageal cancer Paternal Grandfather 41  . Breast cancer Cousin 22  As of July 2019, the patient's father is alive at 2. The patient's mother is also alive at 47. The patient has 1 brother and 5 sisters. There was a maternal grandfather diagnosed with lung cancer at 86. There was a paternal grandfather diagnosed with esophageal cancer at 47. The patient's father had a pre-cancerous esophageal finding at age 3. There was also a maternal 1st cousin diagnosed with metastatic breast cancer at age 64.    GYNECOLOGIC HISTORY:  Patient's last menstrual period was 12/21/2016 (exact date). Menarche: 47 years old Age at first live birth: 47 years old She is GXP2. She is status post partial hysterectomy without BSO in 2018 She never used HRT. She used oral  contraception over 21 years ago with no complications.   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 The patient is separated from her husband, [name]. The patient's son Monica [name] age 40 is in the [place] [employer] stationed in [place] in the Horticulturist, commercial. He plans on working in the railroad industry after he returns. The patient's daughter Monica [name] age 67, works at [employer].      ADVANCED DIRECTIVES: Not in place   HEALTH MAINTENANCE: Social History   Tobacco Use  . Smoking status: Never Smoker  . Smokeless tobacco: Never Used  Substance Use Topics  . Alcohol use: Yes    Comment: occ  . Drug use: No     Colonoscopy:   PAP: 2018/ prior to hysterectomy  Bone density:   No Known Allergies  Current Outpatient Medications  Medication Sig Dispense Refill  . Acetaminophen (TYLENOL PO) Take by mouth 3 (three) times daily. 2 tabs as needed for aches and pains    . dexamethasone (DECADRON) 4 MG tablet Take 2 tablets (8 mg total) by mouth 2 (two) times daily. Start the day before Taxotere. Take once the day after, then 2 times a day x 2d. 30 tablet 1  . fluconazole (DIFLUCAN) 100 MG tablet Take 1 tablet (100 mg total) by mouth daily. 20 tablet 1  . ibuprofen (ADVIL,MOTRIN) 800 MG tablet Take  1 tablet (800 mg total) by mouth every 8 (eight) hours as needed. 30 tablet 0  . lidocaine-prilocaine (EMLA) cream Apply to affected area once 30 g 3  . loratadine (CLARITIN) 10 MG tablet Take 10 mg by mouth daily.    Marland Kitchen LORazepam (ATIVAN) 0.5 MG tablet Take 1 tablet (0.5 mg total) by mouth at bedtime as needed (Nausea or vomiting). 30 tablet 0  . NAPROXEN PO Take by mouth as needed. 1 tab as needed for aches and pain    . oxyCODONE (OXY IR/ROXICODONE) 5 MG immediate release tablet Take 1 tablet (5 mg total) by mouth every 6 (six) hours as needed for severe pain. 15 tablet 0  . Probiotic Product (PROBIOTIC PO) Take by mouth daily.    . prochlorperazine  (COMPAZINE) 10 MG tablet Take 1 tablet (10 mg total) by mouth every 6 (six) hours as needed (Nausea or vomiting). 30 tablet 1   No current facility-administered medications for this visit.     OBJECTIVE:   Vitals:   04/01/18 0803  BP: 133/83  Pulse: 74  Resp: 17  Temp: 98.6 F (37 C)  SpO2: 98%     Body mass index is 25.32 kg/m.   Wt Readings from Last 3 Encounters:  04/01/18 164 lb 1.6 oz (74.4 kg)  03/18/18 164 lb 4 oz (74.5 kg)  03/11/18 173 lb 8 oz (78.7 kg)      ECOG FS:1 GENERAL: Patient is a well appearing female in no acute distress HEENT:  Sclerae anicteric.  Oropharynx clear and moist. Small ulcer noted under left lateral tongue, no evidence of oropharyngeal candidiasis. Neck is supple.  NODES:  No cervical, supraclavicular, or axillary lymphadenopathy palpated.  BREAST EXAM:  S/p lumpectomy no signs of recurrence, right axilla with right lateral portion slightly open, granulation tissue present, clear fluid noted at site.  No erythema or sign of infection. LUNGS:  Clear to auscultation bilaterally.  No wheezes or rhonchi. HEART:  Regular rate and rhythm. No murmur appreciated. ABDOMEN:  Soft, nontender.  Positive, normoactive bowel sounds. No organomegaly palpated. MSK:  No focal spinal tenderness to palpation. Full range of motion bilaterally in the upper extremities. EXTREMITIES:  No peripheral edema.   SKIN:  Clear with no obvious rashes or skin changes. No nail dyscrasia. NEURO:  Nonfocal. Well oriented.  Appropriate affect.    LAB RESULTS:  CMP     Component Value Date/Time   NA 138 03/18/2018 1217   K 4.6 03/18/2018 1217   CL 102 03/18/2018 1217   CO2 28 03/18/2018 1217   GLUCOSE 110 (H) 03/18/2018 1217   BUN 11 03/18/2018 1217   CREATININE 0.90 03/18/2018 1217   CALCIUM 9.8 03/18/2018 1217   PROT 7.3 03/18/2018 1217   ALBUMIN 4.0 03/18/2018 1217   AST 18 03/18/2018 1217   ALT 30 03/18/2018 1217   ALKPHOS 74 03/18/2018 1217   BILITOT 0.3  03/18/2018 1217   GFRNONAA >60 03/18/2018 1217   GFRAA >60 03/18/2018 1217    No results found for: TOTALPROTELP, ALBUMINELP, A1GS, A2GS, BETS, BETA2SER, GAMS, MSPIKE, SPEI  No results found for: KPAFRELGTCHN, LAMBDASER, KAPLAMBRATIO  Lab Results  Component Value Date   WBC 12.2 (H) 04/01/2018   NEUTROABS 11.6 (H) 04/01/2018   HGB 12.9 04/01/2018   HCT 39.8 04/01/2018   MCV 91.3 04/01/2018   PLT 323 04/01/2018    _0 @  No results found for: LABCA2  No components found for: [id number]  No results for input(s): INR in the  last 168 hours.  No results found for: LABCA2  No results found for: LNL892  No results found for: JJH417  No results found for: EYC144  No results found for: CA2729  No components found for: HGQUANT  No results found for: CEA1 / No results found for: CEA1   No results found for: AFPTUMOR  No results found for: CHROMOGRNA  No results found for: PSA1  Appointment on 04/01/2018  Component Date Value Ref Range Status  . WBC Count 04/01/2018 12.2* 3.9 - 10.3 K/uL Final  . RBC 04/01/2018 4.36  3.70 - 5.45 MIL/uL Final  . Hemoglobin 04/01/2018 12.9  11.6 - 15.9 g/dL Final  . HCT 04/01/2018 39.8  34.8 - 46.6 % Final  . MCV 04/01/2018 91.3  79.5 - 101.0 fL Final  . MCH 04/01/2018 29.6  25.1 - 34.0 pg Final  . MCHC 04/01/2018 32.4  31.5 - 36.0 g/dL Final  . RDW 04/01/2018 12.4  11.2 - 14.5 % Final  . Platelet Count 04/01/2018 323  145 - 400 K/uL Final  . Neutrophils Relative % 04/01/2018 96  % Final  . Neutro Abs 04/01/2018 11.6* 1.5 - 6.5 K/uL Final  . Lymphocytes Relative 04/01/2018 3  % Final  . Lymphs Abs 04/01/2018 0.4* 0.9 - 3.3 K/uL Final  . Monocytes Relative 04/01/2018 1  % Final  . Monocytes Absolute 04/01/2018 0.1  0.1 - 0.9 K/uL Final  . Eosinophils Relative 04/01/2018 0  % Final  . Eosinophils Absolute 04/01/2018 0.0  0.0 - 0.5 K/uL Final  . Basophils Relative 04/01/2018 0  % Final  . Basophils Absolute 04/01/2018 0.0   0.0 - 0.1 K/uL Final   Performed at [facility], [place] [address], [place], [place] [address]    (this displays the last labs from the last 3 days)  No results found for: TOTALPROTELP, ALBUMINELP, A1GS, A2GS, BETS, BETA2SER, GAMS, MSPIKE, SPEI (this displays SPEP labs)  No results found for: KPAFRELGTCHN, LAMBDASER, KAPLAMBRATIO (kappa/lambda light chains)  No results found for: HGBA, HGBA2QUANT, HGBFQUANT, HGBSQUAN (Hemoglobinopathy evaluation)   No results found for: LDH  No results found for: IRON, TIBC, IRONPCTSAT (Iron and TIBC)  No results found for: FERRITIN  Urinalysis No results found for: COLORURINE, APPEARANCEUR, LABSPEC, PHURINE, GLUCOSEU, HGBUR, BILIRUBINUR, KETONESUR, PROTEINUR, UROBILINOGEN, NITRITE, LEUKOCYTESUR   STUDIES: No results found.  ELIGIBLE FOR AVAILABLE RESEARCH PROTOCOL: BCEP  ASSESSMENT: 47 y.o.  [place], [place] woman status post right breast upper inner quadrant biopsy 12/29/2017, for a clinical T1c N0, stage IA invasive ductal carcinoma, triple positive, with an MIB-1 of 80%.  (1) genetics testing 02/02/2018 though the CancerNext gene panel offered by [facility] showed no deleterious mutations in  APC, ATM, BARD1, BMPR1A,BRCA1, BRCA2, BRIP1, CDH1, CDK4, CDKN2A, CHEK2, DICER1, HOXB13, MLH1, MRE11A, MSH2, MSH6, MUTYH, NBN, NF1, PALB2, PMS2, POLD1, POLE, PTEN, RAD50, RAD51C, RAD51D, SMAD4, SMARCA4, STK11 and TP53 (sequencing and deletion/duplication); EPCAM and GREM1 (deletion/duplication only).  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>A) )   (2) right lumpectomy and sentinel lymph node sampling 02/16/2018 showed a pT2 pN0, stage IB invasive ductal carcinoma, grade 3, with a positive inferior margin; a total of 5 lymph nodes were removed  (a) additional surgery 02/27/2018 clear the margins  (3) started carboplatin, docetaxel, trastuzumab and Pertuzumab 03/11/2018, to be repeated every 21 days x  6.  Pertuzumab omitted after cycle 1 due to diarrhea.  (4) continue trastuzumab to total 6 months  (a) echocardiogram 01/21/2018  showed an ejection fraction in the 55-60% range  (5) adjuvant radiation to follow  (6) antiestrogens to start at the completion of local treatment  PLAN:  Shalom is doing well today.  I reviewed her CBC which is stable.  She will proceed with her second cycle of Docetaxel, Carboplatin, and Trastuzumab (so long as CMET is within parameters).  We will call in magic mouthwash to her pharmacy for her mucositis.   I reviewed that if her stomach gas and indigestion continue she would benefit from getting OTC prilosec or prevacid to take first thing in the morning on an empty stomach.  We reviewed peripheral neuropathy in detail, and she knows to let us know if she is experiencing anything.    Monica [name] is seeing Dr. Aundra Dubin in cardio-oncolgy.    Monica [name] will return in 1 week for labs and f/u.  She knows to call for any other issues that may develop before the next visit.  A total of (30) minutes of face-to-face time was spent with this patient with greater than 50% of that time in counseling and care-coordination.  Wilber Bihari, NP 04/01/18 8:15 AM Medical Oncology and Hematology [facility] [address] [place], [place] [address] Tel. [phone]    Fax. [phone]

## 2018-04-01 NOTE — Patient Instructions (Signed)
[facility] Discharge Instructions for Patients Receiving Chemotherapy  Today you received the following chemotherapy agents: Trastuzumab (Herceptin), Docetaxel (Taxotere), and Carboplatin (Paraplatin)  To help prevent nausea and vomiting after your treatment, we encourage you to take your nausea medication as directed. Received Aloxi during treatment today-->Take Compazine (not Zofran) for the next 3 days as needed.   If you develop nausea and vomiting that is not controlled by your nausea medication, call the clinic.   BELOW ARE SYMPTOMS THAT SHOULD BE REPORTED IMMEDIATELY:  *FEVER GREATER THAN 100.5 F  *CHILLS WITH OR WITHOUT FEVER  NAUSEA AND VOMITING THAT IS NOT CONTROLLED WITH YOUR NAUSEA MEDICATION  *UNUSUAL SHORTNESS OF BREATH  *UNUSUAL BRUISING OR BLEEDING  TENDERNESS IN MOUTH AND THROAT WITH OR WITHOUT PRESENCE OF ULCERS  *URINARY PROBLEMS  *BOWEL PROBLEMS  UNUSUAL RASH Items with * indicate a potential emergency and should be followed up as soon as possible.  Feel free to call the clinic should you have any questions or concerns. The clinic phone number is [phone].  Please show the [facility] at check-in to the Emergency Department and triage nurse.  Trastuzumab injection for infusion What is this medicine? TRASTUZUMAB (tras TOO zoo mab) is a monoclonal antibody. It is used to treat breast cancer and stomach cancer. This medicine may be used for other purposes; ask your health care provider or pharmacist if you have questions. COMMON BRAND NAME(S): Herceptin What should I tell my health care provider before I take this medicine? They need to know if you have any of these conditions: -heart disease -heart failure -lung or breathing disease, like asthma -an unusual or allergic reaction to trastuzumab, benzyl alcohol, or other medications, foods, dyes, or preservatives -pregnant or trying to get pregnant -breast-feeding How should I use  this medicine? This drug is given as an infusion into a vein. It is administered in a hospital or clinic by a specially trained health care professional. Talk to your pediatrician regarding the use of this medicine in children. This medicine is not approved for use in children. Overdosage: If you think you have taken too much of this medicine contact a poison control center or emergency room at once. NOTE: This medicine is only for you. Do not share this medicine with others. What if I miss a dose? It is important not to miss a dose. Call your doctor or health care professional if you are unable to keep an appointment. What may interact with this medicine? This medicine may interact with the following medications: -certain types of chemotherapy, such as daunorubicin, doxorubicin, epirubicin, and idarubicin This list may not describe all possible interactions. Give your health care provider a list of all the medicines, herbs, non-prescription drugs, or dietary supplements you use. Also tell them if you smoke, drink alcohol, or use illegal drugs. Some items may interact with your medicine. What should I watch for while using this medicine? Visit your doctor for checks on your progress. Report any side effects. Continue your course of treatment even though you feel ill unless your doctor tells you to stop. Call your doctor or health care professional for advice if you get a fever, chills or sore throat, or other symptoms of a cold or flu. Do not treat yourself. Try to avoid being around people who are sick. You may experience fever, chills and shaking during your first infusion. These effects are usually mild and can be treated with other medicines. Report any side effects during the  infusion to your health care professional. Fever and chills usually do not happen with later infusions. Do not become pregnant while taking this medicine or for 7 months after stopping it. Women should inform their doctor if  they wish to become pregnant or think they might be pregnant. Women of child-bearing potential will need to have a negative pregnancy test before starting this medicine. There is a potential for serious side effects to an unborn child. Talk to your health care professional or pharmacist for more information. Do not breast-feed an infant while taking this medicine or for 7 months after stopping it. Women must use effective birth control with this medicine. What side effects may I notice from receiving this medicine? Side effects that you should report to your doctor or health care professional as soon as possible: -allergic reactions like skin rash, itching or hives, swelling of the face, lips, or tongue -chest pain or palpitations -cough -dizziness -feeling faint or lightheaded, falls -fever -general ill feeling or flu-like symptoms -signs of worsening heart failure like breathing problems; swelling in your legs and feet -unusually weak or tired Side effects that usually do not require medical attention (report to your doctor or health care professional if they continue or are bothersome): -bone pain -changes in taste -diarrhea -joint pain -nausea/vomiting -weight loss This list may not describe all possible side effects. Call your doctor for medical advice about side effects. You may report side effects to FDA at 1-800-FDA-1088. Where should I keep my medicine? This drug is given in a hospital or clinic and will not be stored at home. NOTE: This sheet is a summary. It may not cover all possible information. If you have questions about this medicine, talk to your doctor, pharmacist, or health care provider.  2018 Elsevier/Gold Standard (2016-06-23 14:37:52)  Docetaxel injection What is this medicine? DOCETAXEL (doe se TAX el) is a chemotherapy drug. It targets fast dividing cells, like cancer cells, and causes these cells to die. This medicine is used to treat many types of cancers like  breast cancer, certain stomach cancers, head and neck cancer, lung cancer, and prostate cancer. This medicine may be used for other purposes; ask your health care provider or pharmacist if you have questions. COMMON BRAND NAME(S): Docefrez, Taxotere What should I tell my health care provider before I take this medicine? They need to know if you have any of these conditions: -infection (especially a virus infection such as chickenpox, cold sores, or herpes) -liver disease -low blood counts, like low white cell, platelet, or red cell counts -an unusual or allergic reaction to docetaxel, polysorbate 80, other chemotherapy agents, other medicines, foods, dyes, or preservatives -pregnant or trying to get pregnant -breast-feeding How should I use this medicine? This drug is given as an infusion into a vein. It is administered in a hospital or clinic by a specially trained health care professional. Talk to your pediatrician regarding the use of this medicine in children. Special care may be needed. Overdosage: If you think you have taken too much of this medicine contact a poison control center or emergency room at once. NOTE: This medicine is only for you. Do not share this medicine with others. What if I miss a dose? It is important not to miss your dose. Call your doctor or health care professional if you are unable to keep an appointment. What may interact with this medicine? -cyclosporine -erythromycin -ketoconazole -medicines to increase blood counts like filgrastim, pegfilgrastim, sargramostim -vaccines Talk to your doctor  or health care professional before taking any of these medicines: -acetaminophen -aspirin -ibuprofen -ketoprofen -naproxen This list may not describe all possible interactions. Give your health care provider a list of all the medicines, herbs, non-prescription drugs, or dietary supplements you use. Also tell them if you smoke, drink alcohol, or use illegal drugs. Some  items may interact with your medicine. What should I watch for while using this medicine? Your condition will be monitored carefully while you are receiving this medicine. You will need important blood work done while you are taking this medicine. This drug may make you feel generally unwell. This is not uncommon, as chemotherapy can affect healthy cells as well as cancer cells. Report any side effects. Continue your course of treatment even though you feel ill unless your doctor tells you to stop. In some cases, you may be given additional medicines to help with side effects. Follow all directions for their use. Call your doctor or health care professional for advice if you get a fever, chills or sore throat, or other symptoms of a cold or flu. Do not treat yourself. This drug decreases your body's ability to fight infections. Try to avoid being around people who are sick. This medicine may increase your risk to bruise or bleed. Call your doctor or health care professional if you notice any unusual bleeding. This medicine may contain alcohol in the product. You may get drowsy or dizzy. Do not drive, use machinery, or do anything that needs mental alertness until you know how this medicine affects you. Do not stand or sit up quickly, especially if you are an older patient. This reduces the risk of dizzy or fainting spells. Avoid alcoholic drinks. Do not become pregnant while taking this medicine. Women should inform their doctor if they wish to become pregnant or think they might be pregnant. There is a potential for serious side effects to an unborn child. Talk to your health care professional or pharmacist for more information. Do not breast-feed an infant while taking this medicine. What side effects may I notice from receiving this medicine? Side effects that you should report to your doctor or health care professional as soon as possible: -allergic reactions like skin rash, itching or hives, swelling  of the face, lips, or tongue -low blood counts - This drug may decrease the number of white blood cells, red blood cells and platelets. You may be at increased risk for infections and bleeding. -signs of infection - fever or chills, cough, sore throat, pain or difficulty passing urine -signs of decreased platelets or bleeding - bruising, pinpoint red spots on the skin, black, tarry stools, nosebleeds -signs of decreased red blood cells - unusually weak or tired, fainting spells, lightheadedness -breathing problems -fast or irregular heartbeat -low blood pressure -mouth sores -nausea and vomiting -pain, swelling, redness or irritation at the injection site -pain, tingling, numbness in the hands or feet -swelling of the ankle, feet, hands -weight gain Side effects that usually do not require medical attention (report to your doctor or health care professional if they continue or are bothersome): -bone pain -complete hair loss including hair on your head, underarms, pubic hair, eyebrows, and eyelashes -diarrhea -excessive tearing -changes in the color of fingernails -loosening of the fingernails -nausea -muscle pain -red flush to skin -sweating -weak or tired This list may not describe all possible side effects. Call your doctor for medical advice about side effects. You may report side effects to FDA at 1-800-FDA-1088. Where should I keep  my medicine? This drug is given in a hospital or clinic and will not be stored at home. NOTE: This sheet is a summary. It may not cover all possible information. If you have questions about this medicine, talk to your doctor, pharmacist, or health care provider.  2018 Elsevier/Gold Standard (2015-08-01 12:32:56)  Carboplatin injection What is this medicine? CARBOPLATIN (KAR boe pla tin) is a chemotherapy drug. It targets fast dividing cells, like cancer cells, and causes these cells to die. This medicine is used to treat ovarian cancer and many  other cancers. This medicine may be used for other purposes; ask your health care provider or pharmacist if you have questions. COMMON BRAND NAME(S): Paraplatin What should I tell my health care provider before I take this medicine? They need to know if you have any of these conditions: -blood disorders -hearing problems -kidney disease -recent or ongoing radiation therapy -an unusual or allergic reaction to carboplatin, cisplatin, other chemotherapy, other medicines, foods, dyes, or preservatives -pregnant or trying to get pregnant -breast-feeding How should I use this medicine? This drug is usually given as an infusion into a vein. It is administered in a hospital or clinic by a specially trained health care professional. Talk to your pediatrician regarding the use of this medicine in children. Special care may be needed. Overdosage: If you think you have taken too much of this medicine contact a poison control center or emergency room at once. NOTE: This medicine is only for you. Do not share this medicine with others. What if I miss a dose? It is important not to miss a dose. Call your doctor or health care professional if you are unable to keep an appointment. What may interact with this medicine? -medicines for seizures -medicines to increase blood counts like filgrastim, pegfilgrastim, sargramostim -some antibiotics like amikacin, gentamicin, neomycin, streptomycin, tobramycin -vaccines Talk to your doctor or health care professional before taking any of these medicines: -acetaminophen -aspirin -ibuprofen -ketoprofen -naproxen This list may not describe all possible interactions. Give your health care provider a list of all the medicines, herbs, non-prescription drugs, or dietary supplements you use. Also tell them if you smoke, drink alcohol, or use illegal drugs. Some items may interact with your medicine. What should I watch for while using this medicine? Your condition will  be monitored carefully while you are receiving this medicine. You will need important blood work done while you are taking this medicine. This drug may make you feel generally unwell. This is not uncommon, as chemotherapy can affect healthy cells as well as cancer cells. Report any side effects. Continue your course of treatment even though you feel ill unless your doctor tells you to stop. In some cases, you may be given additional medicines to help with side effects. Follow all directions for their use. Call your doctor or health care professional for advice if you get a fever, chills or sore throat, or other symptoms of a cold or flu. Do not treat yourself. This drug decreases your body's ability to fight infections. Try to avoid being around people who are sick. This medicine may increase your risk to bruise or bleed. Call your doctor or health care professional if you notice any unusual bleeding. Be careful brushing and flossing your teeth or using a toothpick because you may get an infection or bleed more easily. If you have any dental work done, tell your dentist you are receiving this medicine. Avoid taking products that contain aspirin, acetaminophen, ibuprofen, naproxen, or ketoprofen  unless instructed by your doctor. These medicines may hide a fever. Do not become pregnant while taking this medicine. Women should inform their doctor if they wish to become pregnant or think they might be pregnant. There is a potential for serious side effects to an unborn child. Talk to your health care professional or pharmacist for more information. Do not breast-feed an infant while taking this medicine. What side effects may I notice from receiving this medicine? Side effects that you should report to your doctor or health care professional as soon as possible: -allergic reactions like skin rash, itching or hives, swelling of the face, lips, or tongue -signs of infection - fever or chills, cough, sore  throat, pain or difficulty passing urine -signs of decreased platelets or bleeding - bruising, pinpoint red spots on the skin, black, tarry stools, nosebleeds -signs of decreased red blood cells - unusually weak or tired, fainting spells, lightheadedness -breathing problems -changes in hearing -changes in vision -chest pain -high blood pressure -low blood counts - This drug may decrease the number of white blood cells, red blood cells and platelets. You may be at increased risk for infections and bleeding. -nausea and vomiting -pain, swelling, redness or irritation at the injection site -pain, tingling, numbness in the hands or feet -problems with balance, talking, walking -trouble passing urine or change in the amount of urine Side effects that usually do not require medical attention (report to your doctor or health care professional if they continue or are bothersome): -hair loss -loss of appetite -metallic taste in the mouth or changes in taste This list may not describe all possible side effects. Call your doctor for medical advice about side effects. You may report side effects to FDA at 1-800-FDA-1088. Where should I keep my medicine? This drug is given in a hospital or clinic and will not be stored at home. NOTE: This sheet is a summary. It may not cover all possible information. If you have questions about this medicine, talk to your doctor, pharmacist, or health care provider.  2018 Elsevier/Gold Standard (2007-10-04 14:38:05)

## 2018-04-08 ENCOUNTER — Encounter: Payer: Self-pay | Admitting: *Deleted

## 2018-04-08 ENCOUNTER — Inpatient Hospital Stay: Payer: Managed Care, Other (non HMO)

## 2018-04-08 ENCOUNTER — Other Ambulatory Visit: Payer: Self-pay | Admitting: Oncology

## 2018-04-08 ENCOUNTER — Encounter: Payer: Self-pay | Admitting: Adult Health

## 2018-04-08 ENCOUNTER — Inpatient Hospital Stay (HOSPITAL_BASED_OUTPATIENT_CLINIC_OR_DEPARTMENT_OTHER): Payer: Managed Care, Other (non HMO) | Admitting: Adult Health

## 2018-04-08 ENCOUNTER — Telehealth: Payer: Self-pay | Admitting: Adult Health

## 2018-04-08 VITALS — BP 141/86 | HR 78 | Temp 98.6°F | Resp 20 | Ht 67.5 in | Wt 161.3 lb

## 2018-04-08 DIAGNOSIS — Z5112 Encounter for antineoplastic immunotherapy: Secondary | ICD-10-CM | POA: Diagnosis not present

## 2018-04-08 DIAGNOSIS — Z170 Estrogen receptor positive status [ER+]: Secondary | ICD-10-CM | POA: Diagnosis not present

## 2018-04-08 DIAGNOSIS — C50211 Malignant neoplasm of upper-inner quadrant of right female breast: Secondary | ICD-10-CM

## 2018-04-08 DIAGNOSIS — Z95828 Presence of other vascular implants and grafts: Secondary | ICD-10-CM

## 2018-04-08 DIAGNOSIS — Z803 Family history of malignant neoplasm of breast: Secondary | ICD-10-CM

## 2018-04-08 DIAGNOSIS — Z801 Family history of malignant neoplasm of trachea, bronchus and lung: Secondary | ICD-10-CM

## 2018-04-08 DIAGNOSIS — Z800 Family history of malignant neoplasm of digestive organs: Secondary | ICD-10-CM

## 2018-04-08 LAB — CMP (CANCER CENTER ONLY)
ALT: 83 U/L — ABNORMAL HIGH (ref 0–44)
AST: 43 U/L — ABNORMAL HIGH (ref 15–41)
Albumin: 4.4 g/dL (ref 3.5–5.0)
Alkaline Phosphatase: 91 U/L (ref 38–126)
Anion gap: 9 (ref 5–15)
BUN: 10 mg/dL (ref 6–20)
CO2: 31 mmol/L (ref 22–32)
Calcium: 10.5 mg/dL — ABNORMAL HIGH (ref 8.9–10.3)
Chloride: 98 mmol/L (ref 98–111)
Creatinine: 0.75 mg/dL (ref 0.44–1.00)
GFR, Est AFR Am: >60 mL/min (ref >60)
GFR, Estimated: >60 mL/min (ref >60)
Glucose, Bld: 94 mg/dL (ref 70–99)
Potassium: 4.5 mmol/L (ref 3.5–5.1)
Sodium: 138 mmol/L (ref 135–145)
Total Bilirubin: 0.4 mg/dL (ref 0.3–1.2)
Total Protein: 7.5 g/dL (ref 6.5–8.1)

## 2018-04-08 LAB — CBC WITH DIFFERENTIAL (CANCER CENTER ONLY)
Basophils Absolute: 0.1 10*3/uL (ref 0.0–0.1)
Basophils Relative: 0 %
Eosinophils Absolute: 0 10*3/uL (ref 0.0–0.5)
Eosinophils Relative: 0 %
HCT: 38.7 % (ref 34.8–46.6)
Hemoglobin: 12.6 g/dL (ref 11.6–15.9)
Lymphocytes Relative: 9 %
Lymphs Abs: 1.6 10*3/uL (ref 0.9–3.3)
MCH: 29.4 pg (ref 25.1–34.0)
MCHC: 32.6 g/dL (ref 31.5–36.0)
MCV: 90.3 fL (ref 79.5–101.0)
Monocytes Absolute: 1.5 10*3/uL — ABNORMAL HIGH (ref 0.1–0.9)
Monocytes Relative: 8 %
Neutro Abs: 14.7 10*3/uL — ABNORMAL HIGH (ref 1.5–6.5)
Neutrophils Relative %: 83 %
Platelet Count: 150 10*3/uL (ref 145–400)
RBC: 4.28 MIL/uL (ref 3.70–5.45)
RDW: 12.6 % (ref 11.2–14.5)
WBC Count: 17.9 10*3/uL — ABNORMAL HIGH (ref 3.9–10.3)

## 2018-04-08 LAB — RESEARCH LABS

## 2018-04-08 MED ORDER — SODIUM CHLORIDE 0.9% FLUSH
10.0000 mL | Freq: Once | INTRAVENOUS | Status: AC
  Administered 2018-04-08: 10 mL
  Filled 2018-04-08: qty 10

## 2018-04-08 MED ORDER — HEPARIN SOD (PORK) LOCK FLUSH 100 UNIT/ML IV SOLN
500.0000 [IU] | Freq: Once | INTRAVENOUS | Status: AC
  Administered 2018-04-08: 500 [IU]
  Filled 2018-04-08: qty 5

## 2018-04-08 NOTE — Progress Notes (Signed)
04/08/18 at 2:42pm -[facility] 1 month study notes- The pt was into the clinic for her routine follow up appointment with Dr.Magrinat's NP, Mendel Ryder Cornetto-Causey.  The pt's 1 month on-study serum and plasma samples were drawn today.  The samples will be frozen and shipped on Monday, 04/11/2018.  The research nurse met with the pt to coordinate the pt's month 3 assessments.  The pt said that she prefers to have her month 3 assessments done on her 06/14/18 after her echo and appt with Dr. Aundra Dubin.  The research nurse will schedule these appointments.  The research nurse also reviewed with the pt her baseline cardiac MRI clinical report.  The pt was informed that her cardiac MRI was "negative for alert findings".  A copy of this clinical report was given to the pt for her records.  The pt was thanked for her continued support of the trial. Brion Aliment RN, BSN, CCRP  Clinical Research Nurse 04/08/2018 2:47 PM

## 2018-04-08 NOTE — Telephone Encounter (Signed)
Gave pt avs and calendar  °

## 2018-04-08 NOTE — Progress Notes (Signed)
[facility]  [phone] [phone]) [phone]     ID: Monica [name] DOB: Mar 13, 1971  MR#: [id number]  NTI#:[id number]  Patient Care Team: [name], PA-C as PCP - General (Family Medicine) [name], MD as Consulting Physician (General Surgery) Magrinat, [name], MD as Consulting Physician (Oncology) [name], MD as Consulting Physician (Radiation Oncology) [name], MD as Consulting Physician (Obstetrics and Gynecology) OTHER MD: [facility]  CHIEF COMPLAINT: triple positive breast cancer  CURRENT TREATMENT: adjuvant chemo-immunotherapy   HISTORY OF CURRENT ILLNESS: From the original intake note:  Monica [name] (pronounced "uric") palpated a right supraclavicular mass and felt tenderness after stretching one day. She brought this to her PA's attention.  The patient underwent bilateral diagnostic mammography with tomography and right breast ultrasonography at [facility] on 12/20/2017 showing: breast density category C. There was a suspicious palpable mass at the 1 o'clock position upper inner quadrant measuring 1.4 x 1.3 x 1.3 cm located 12 cm from the nipple. There was an additional mass at the 8 o'clock radiant measuring 1.2 x 1.0 x 1.4 cm, which likely represented a cyst. Ultrasound evaluation of the right axilla demonstrates no evidence of lymphadenopathy  Accordingly on 12/29/2017 she proceeded to biopsy of the right breast area in question. The pathology from this procedure showed ([id number]): Invasive ductal carcinoma, grade III. Prognostic indicators significant for: estrogen receptor, 80% positive and progesterone receptor, 70% positive, both with strong staining intensity. Proliferation marker Ki67 at 80%. HER2 amplified with ratios HER2/CEP17 signals 3.44 and average HER2 copies per cell 10.65.  The patient's subsequent history is as detailed below.  INTERVAL HISTORY: Monica [name] returns today for follow up and  treatment of her triple positive breast cancer accompanied by her mother-in-law, Monica [name]. She started adjuvant chemotherapy on 03/11/2018 with carboplatin, docetaxel, trastuzumab, and pertuzumab given every 21 days x 6 cycles. (Pertuzumab omitted after cycle 1 due to diarrhea).  Today is day 8 cycle 2.   REVIEW OF SYSTEMS: Monica [name] is doing moderately well today.  She developed some diarrhea following her chemotherapy treatment.  She had to pull over on her way home.  She had diarrhea and was on the sofa and had to wear depends due to the increased diarrhea.  She took imodium and it did help.  Saturday she was fine and the diarrhea had resolved, and felt fine for the next few days.  Tuesday night she started feeling poorly with diarrhea.  This was not as severe as the first time.  She had 4 episodes of diarrhea that night.  Wednesday she was fatigued.  She did go got work yesterday and did feel fatigued.  She notes that she felt the thrush returned yesterday.  She did take diflucan from last Friday to Tuesday.  She notes her tongue felt like it was burning.  She is nauseated related to different smells.  She has a decreased appetite.  She restarted diflucan yesterday.  It is helping her mouth.   Monica [name] takes 2 imodium pills with her first diarrhea, and one for every subsequent diarrhea.  She has had to take 4-5 pills, and typically it works for her.   No peripheral neuropathy.  She has had some nausea and has continued the compazine.  Otherwise, a detailed ROS was non contributory.   PAST MEDICAL HISTORY: Past Medical History:  Diagnosis Date  . Arthritis    lower back  . Cancer [facility])    right breast cancer  . Family history of breast cancer  She notes that she had a heart murmur as a child, which she grew out of.   PAST SURGICAL HISTORY: Past Surgical History:  Procedure Laterality Date  . ABDOMINAL HYSTERECTOMY    . BREAST LUMPECTOMY WITH RADIOACTIVE SEED AND SENTINEL LYMPH NODE BIOPSY  Right 02/16/2018   Procedure: RIGHT BREAST LUMPECTOMY WITH RADIOACTIVE SEED AND SENTINEL LYMPH NODE BIOPSY;  Surgeon: [name], MD;  Location: [place];  Service: General;  Laterality: Right;  . HYMENECTOMY    . PORTACATH PLACEMENT Right 02/16/2018   Procedure: INSERTION PORT-A-CATH;  Surgeon: [name], MD;  Location: [place];  Service: General;  Laterality: Right;  . RE-EXCISION OF BREAST LUMPECTOMY Right 02/22/2018   Procedure: RE-EXCISION OF RIGHT  BREAST LUMPECTOMY;  Surgeon: [name], MD;  Location: [place];  Service: General;  Laterality: Right;  . REPAIR VAGINAL CUFF N/A 02/07/2017   Procedure: REPAIR VAGINAL CUFF;  Surgeon: Ena Dawley, MD;  Location: [facility];  Service: Gynecology;  Laterality: N/A;  . ROBOTIC ASSISTED TOTAL HYSTERECTOMY WITH SALPINGECTOMY Left 01/20/2017   Procedure: ROBOTIC ASSISTED TOTAL HYSTERECTOMY WITH SALPINGECTOMY;  Surgeon: [name], MD;  Location: [facility];  Service: Gynecology;  Laterality: Left;  Partial Hysterectomy without BSO  FAMILY HISTORY Family History  Problem Relation Age of Onset  . Lung cancer Maternal Grandfather   . Esophageal cancer Paternal Grandfather 45  . Breast cancer Cousin 43  As of July 2019, the patient's father is alive at 79. The patient's mother is also alive at 67. The patient has 1 brother and 5 sisters. There was a maternal grandfather diagnosed with lung cancer at 18. There was a paternal grandfather diagnosed with esophageal cancer at 26. The patient's father had a pre-cancerous esophageal finding at age 70. There was also a maternal 1st cousin diagnosed with metastatic breast cancer at age 56.    GYNECOLOGIC HISTORY:  Patient's last menstrual period was 12/21/2016 (exact date). Menarche: 47 years old Age at first live birth: 47 years old She is GXP2. She is status post partial hysterectomy without BSO in 2018 She never used HRT. She used oral  contraception over 21 years ago with no complications.   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 The patient is separated from her husband, [name]. The patient's son [name] age 76 is in the [place] [employer] stationed in [place] in the Horticulturist, commercial. He plans on working in the railroad industry after he returns. The patient's daughter [name] age 92, works at [employer].      ADVANCED DIRECTIVES: Not in place   HEALTH MAINTENANCE: Social History   Tobacco Use  . Smoking status: Never Smoker  . Smokeless tobacco: Never Used  Substance Use Topics  . Alcohol use: Yes    Comment: occ  . Drug use: No     Colonoscopy:   PAP: 2018/ prior to hysterectomy  Bone density:   No Known Allergies  Current Outpatient Medications  Medication Sig Dispense Refill  . Acetaminophen (TYLENOL PO) Take by mouth 3 (three) times daily. 2 tabs as needed for aches and pains    . dexamethasone (DECADRON) 4 MG tablet Take 2 tablets (8 mg total) by mouth 2 (two) times daily. Start the day before Taxotere. Take once the day after, then 2 times a day x 2d. 30 tablet 1  . fluconazole (DIFLUCAN) 100 MG tablet Take 1 tablet (100 mg total) by mouth daily. 20 tablet 1  .  ibuprofen (ADVIL,MOTRIN) 800 MG tablet Take 1 tablet (800 mg total) by mouth every 8 (eight) hours as needed. 30 tablet 0  . lidocaine-prilocaine (EMLA) cream Apply to affected area once 30 g 3  . loratadine (CLARITIN) 10 MG tablet Take 10 mg by mouth daily.    Marland Kitchen LORazepam (ATIVAN) 0.5 MG tablet Take 1 tablet (0.5 mg total) by mouth at bedtime as needed (Nausea or vomiting). 30 tablet 0  . magic mouthwash w/lidocaine SOLN Take 5 mLs by mouth 4 (four) times daily as needed for mouth pain. Swish, Swallow, or spit. 240 mL 1  . NAPROXEN PO Take by mouth as needed. 1 tab as needed for aches and pain    . oxyCODONE (OXY IR/ROXICODONE) 5 MG immediate release tablet Take 1 tablet (5 mg total) by mouth every  6 (six) hours as needed for severe pain. 15 tablet 0  . Probiotic Product (PROBIOTIC PO) Take by mouth daily.    . prochlorperazine (COMPAZINE) 10 MG tablet Take 1 tablet (10 mg total) by mouth every 6 (six) hours as needed (Nausea or vomiting). 30 tablet 1   No current facility-administered medications for this visit.     OBJECTIVE:   Vitals:   04/08/18 1353  BP: (!) 141/86  Pulse: 78  Resp: 20  Temp: 98.6 F (37 C)  SpO2: 99%     Body mass index is 24.89 kg/m.   Wt Readings from Last 3 Encounters:  04/08/18 161 lb 4.8 oz (73.2 kg)  04/01/18 164 lb 1.6 oz (74.4 kg)  03/18/18 164 lb 4 oz (74.5 kg)  ECOG FS:1 GENERAL: Patient is a well appearing female in no acute distress HEENT:  Sclerae anicteric.  Oropharynx clear and moist. Small ulcer still noted under left lateral tongue, no evidence of oropharyngeal candidiasis. Neck is supple.  NODES:  No cervical, supraclavicular, or axillary lymphadenopathy palpated.  BREAST EXAM:  deferred LUNGS:  Clear to auscultation bilaterally.  No wheezes or rhonchi. HEART:  Regular rate and rhythm. No murmur appreciated. ABDOMEN:  Soft, nontender.  Positive, normoactive bowel sounds. No organomegaly palpated. MSK:  No focal spinal tenderness to palpation. Full range of motion bilaterally in the upper extremities. EXTREMITIES:  No peripheral edema.   SKIN:  Clear with no obvious rashes or skin changes. No nail dyscrasia. NEURO:  Nonfocal. Well oriented.  Appropriate affect.    LAB RESULTS:  CMP     Component Value Date/Time   NA 140 04/01/2018 0742   K 4.1 04/01/2018 0742   CL 106 04/01/2018 0742   CO2 23 04/01/2018 0742   GLUCOSE 182 (H) 04/01/2018 0742   BUN 16 04/01/2018 0742   CREATININE 0.83 04/01/2018 0742   CALCIUM 9.8 04/01/2018 0742   PROT 7.4 04/01/2018 0742   ALBUMIN 4.1 04/01/2018 0742   AST 16 04/01/2018 0742   ALT 17 04/01/2018 0742   ALKPHOS 71 04/01/2018 0742   BILITOT 0.4 04/01/2018 0742   GFRNONAA >60  04/01/2018 0742   GFRAA >60 04/01/2018 0742    No results found for: TOTALPROTELP, ALBUMINELP, A1GS, A2GS, BETS, BETA2SER, GAMS, MSPIKE, SPEI  No results found for: KPAFRELGTCHN, LAMBDASER, KAPLAMBRATIO  Lab Results  Component Value Date   WBC 12.2 (H) 04/01/2018   NEUTROABS 11.6 (H) 04/01/2018   HGB 12.9 04/01/2018   HCT 39.8 04/01/2018   MCV 91.3 04/01/2018   PLT 323 04/01/2018    _0 @  No results found for: LABCA2  No components found for: ZMOQHU765  No results for input(s):  INR in the last 168 hours.  No results found for: LABCA2  No results found for: IFO277  No results found for: AJO878  No results found for: MVE720  No results found for: CA2729  No components found for: HGQUANT  No results found for: CEA1 / No results found for: CEA1   No results found for: AFPTUMOR  No results found for: CHROMOGRNA  No results found for: PSA1  No visits with results within 3 Day(s) from this visit.  Latest known visit with results is:  Appointment on 04/01/2018  Component Date Value Ref Range Status  . WBC Count 04/01/2018 12.2* 3.9 - 10.3 K/uL Final  . RBC 04/01/2018 4.36  3.70 - 5.45 MIL/uL Final  . Hemoglobin 04/01/2018 12.9  11.6 - 15.9 g/dL Final  . HCT 04/01/2018 39.8  34.8 - 46.6 % Final  . MCV 04/01/2018 91.3  79.5 - 101.0 fL Final  . MCH 04/01/2018 29.6  25.1 - 34.0 pg Final  . MCHC 04/01/2018 32.4  31.5 - 36.0 g/dL Final  . RDW 04/01/2018 12.4  11.2 - 14.5 % Final  . Platelet Count 04/01/2018 323  145 - 400 K/uL Final  . Neutrophils Relative % 04/01/2018 96  % Final  . Neutro Abs 04/01/2018 11.6* 1.5 - 6.5 K/uL Final  . Lymphocytes Relative 04/01/2018 3  % Final  . Lymphs Abs 04/01/2018 0.4* 0.9 - 3.3 K/uL Final  . Monocytes Relative 04/01/2018 1  % Final  . Monocytes Absolute 04/01/2018 0.1  0.1 - 0.9 K/uL Final  . Eosinophils Relative 04/01/2018 0  % Final  . Eosinophils Absolute 04/01/2018 0.0  0.0 - 0.5 K/uL Final  . Basophils  Relative 04/01/2018 0  % Final  . Basophils Absolute 04/01/2018 0.0  0.0 - 0.1 K/uL Final   Performed at [facility], [place] [address] [place], [place] [address]  . Sodium 04/01/2018 140  135 - 145 mmol/L Final  . Potassium 04/01/2018 4.1  3.5 - 5.1 mmol/L Final  . Chloride 04/01/2018 106  98 - 111 mmol/L Final  . CO2 04/01/2018 23  22 - 32 mmol/L Final  . Glucose, Bld 04/01/2018 182* 70 - 99 mg/dL Final  . BUN 04/01/2018 16  6 - 20 mg/dL Final  . Creatinine 04/01/2018 0.83  0.44 - 1.00 mg/dL Final  . Calcium 04/01/2018 9.8  8.9 - 10.3 mg/dL Final  . Total Protein 04/01/2018 7.4  6.5 - 8.1 g/dL Final  . Albumin 04/01/2018 4.1  3.5 - 5.0 g/dL Final  . AST 04/01/2018 16  15 - 41 U/L Final  . ALT 04/01/2018 17  0 - 44 U/L Final  . Alkaline Phosphatase 04/01/2018 71  38 - 126 U/L Final  . Total Bilirubin 04/01/2018 0.4  0.3 - 1.2 mg/dL Final  . GFR, Est Non Af Am 04/01/2018 >60  >60 mL/min Final  . GFR, Est AFR Am 04/01/2018 >60  >60 mL/min Final   Comment: (NOTE) The eGFR has been calculated using the CKD EPI equation. This calculation has not been validated in all clinical situations. eGFR's persistently <60 mL/min signify possible Chronic Kidney Disease.   Georgiann Hahn gap 04/01/2018 11  5 - 15 Final   Performed at [facility], [place] [address], [place], [place] [address]    (this displays the last labs from the last 3 days)  No results found for: TOTALPROTELP, ALBUMINELP, A1GS, A2GS, BETS, BETA2SER, GAMS, MSPIKE, SPEI (this displays SPEP labs)  No results  found for: KPAFRELGTCHN, LAMBDASER, KAPLAMBRATIO (kappa/lambda light chains)  No results found for: HGBA, HGBA2QUANT, HGBFQUANT, HGBSQUAN (Hemoglobinopathy evaluation)   No results found for: LDH  No results found for: IRON, TIBC, IRONPCTSAT (Iron and TIBC)  No results found for: FERRITIN  Urinalysis No results found for: COLORURINE, APPEARANCEUR, LABSPEC, PHURINE, GLUCOSEU,  HGBUR, BILIRUBINUR, KETONESUR, PROTEINUR, UROBILINOGEN, NITRITE, LEUKOCYTESUR   STUDIES: No results found.  ELIGIBLE FOR AVAILABLE RESEARCH PROTOCOL: BCEP  ASSESSMENT: 47 y.o.  Monica [name], Monica [name] woman status post right breast upper inner quadrant biopsy 12/29/2017, for a clinical T1c N0, stage IA invasive ductal carcinoma, triple positive, with an MIB-1 of 80%.  (1) genetics testing 02/02/2018 though the CancerNext gene panel offered by [facility] showed no deleterious mutations in  APC, ATM, BARD1, BMPR1A,BRCA1, BRCA2, BRIP1, CDH1, CDK4, CDKN2A, CHEK2, DICER1, HOXB13, MLH1, MRE11A, MSH2, MSH6, MUTYH, NBN, NF1, PALB2, PMS2, POLD1, POLE, PTEN, RAD50, RAD51C, RAD51D, SMAD4, SMARCA4, STK11 and TP53 (sequencing and deletion/duplication); EPCAM and GREM1 (deletion/duplication only).  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>A) )   (2) right lumpectomy and sentinel lymph node sampling 02/16/2018 showed a pT2 pN0, stage IB invasive ductal carcinoma, grade 3, with a positive inferior margin; a total of 5 lymph nodes were removed  (a) additional surgery 02/27/2018 clear the margins  (3) started carboplatin, docetaxel, trastuzumab and Pertuzumab 03/11/2018, to be repeated every 21 days x 6.  Pertuzumab omitted after cycle 1 due to diarrhea.  (4) continue trastuzumab to total 6 months  (a) echocardiogram 01/21/2018 showed an ejection fraction in the 55-60% range  (5) adjuvant radiation to follow  (6) antiestrogens to start at the completion of local treatment  PLAN:  Monica [name] is doing well today.  We reviewed her diarrhea history and now it is resolved.  She knows how to take her imodium if needed.  When she returns for her next appointment Dr. Jana Hakim will be back from vacation and we will determine if any dose adjustments need to be made.  I recommended she take her diflucan for a few more days.  She has magic mouthwash.  She and is discussed that with subsequent cycles  she will likely need to take the diflucan for a longer period of time.    Monica [name] unfortunately only had research labs drawn today.  She will go back by the lab after our appointment for a CBC and CMET so I can fully evaluate her blood counts, and determine if she is dehydrated or whether or not she has had any electrolyte imbalances from her diarrhea.  I have added lab orders for all of her subsequent appointments.    Monica [name] will return in two weeks for labs, f/u and her next treatment.  She knows to call for any questions or concerns prior to her next appointment.    A total of (30) minutes of face-to-face time was spent with this patient with greater than 50% of that time in counseling and care-coordination.  Wilber Bihari, NP 04/08/18 2:16 PM Medical Oncology and Hematology [facility] [address] [place], [place] [address] Tel. [phone]    Fax. [phone]

## 2018-04-11 ENCOUNTER — Other Ambulatory Visit: Payer: Self-pay | Admitting: Oncology

## 2018-04-11 DIAGNOSIS — C50919 Malignant neoplasm of unspecified site of unspecified female breast: Secondary | ICD-10-CM

## 2018-04-13 ENCOUNTER — Telehealth: Payer: Self-pay | Admitting: *Deleted

## 2018-04-13 NOTE — Telephone Encounter (Signed)
Scheduled appt per 10/1 sch message - pt to get an updated schedule next visit.   

## 2018-04-22 ENCOUNTER — Inpatient Hospital Stay: Payer: Managed Care, Other (non HMO)

## 2018-04-22 ENCOUNTER — Inpatient Hospital Stay: Payer: Managed Care, Other (non HMO) | Attending: Oncology

## 2018-04-22 ENCOUNTER — Other Ambulatory Visit: Payer: Managed Care, Other (non HMO)

## 2018-04-22 ENCOUNTER — Encounter: Payer: Self-pay | Admitting: Adult Health

## 2018-04-22 ENCOUNTER — Inpatient Hospital Stay (HOSPITAL_BASED_OUTPATIENT_CLINIC_OR_DEPARTMENT_OTHER): Payer: Managed Care, Other (non HMO) | Admitting: Adult Health

## 2018-04-22 VITALS — BP 141/96 | HR 63 | Temp 98.3°F | Resp 18 | Ht 67.5 in | Wt 159.9 lb

## 2018-04-22 DIAGNOSIS — L989 Disorder of the skin and subcutaneous tissue, unspecified: Secondary | ICD-10-CM | POA: Insufficient documentation

## 2018-04-22 DIAGNOSIS — Z170 Estrogen receptor positive status [ER+]: Secondary | ICD-10-CM

## 2018-04-22 DIAGNOSIS — B379 Candidiasis, unspecified: Secondary | ICD-10-CM | POA: Diagnosis not present

## 2018-04-22 DIAGNOSIS — Z801 Family history of malignant neoplasm of trachea, bronchus and lung: Secondary | ICD-10-CM

## 2018-04-22 DIAGNOSIS — C50211 Malignant neoplasm of upper-inner quadrant of right female breast: Secondary | ICD-10-CM

## 2018-04-22 DIAGNOSIS — Z5111 Encounter for antineoplastic chemotherapy: Secondary | ICD-10-CM | POA: Insufficient documentation

## 2018-04-22 DIAGNOSIS — Z800 Family history of malignant neoplasm of digestive organs: Secondary | ICD-10-CM | POA: Diagnosis not present

## 2018-04-22 DIAGNOSIS — Z803 Family history of malignant neoplasm of breast: Secondary | ICD-10-CM | POA: Diagnosis not present

## 2018-04-22 DIAGNOSIS — Z5112 Encounter for antineoplastic immunotherapy: Secondary | ICD-10-CM | POA: Insufficient documentation

## 2018-04-22 DIAGNOSIS — Z95828 Presence of other vascular implants and grafts: Secondary | ICD-10-CM

## 2018-04-22 DIAGNOSIS — R197 Diarrhea, unspecified: Secondary | ICD-10-CM

## 2018-04-22 LAB — CMP (CANCER CENTER ONLY)
ALT: 19 U/L (ref 0–44)
AST: 17 U/L (ref 15–41)
Albumin: 4.2 g/dL (ref 3.5–5.0)
Alkaline Phosphatase: 63 U/L (ref 38–126)
Anion gap: 11 (ref 5–15)
BUN: 11 mg/dL (ref 6–20)
CO2: 24 mmol/L (ref 22–32)
Calcium: 10 mg/dL (ref 8.9–10.3)
Chloride: 106 mmol/L (ref 98–111)
Creatinine: 0.67 mg/dL (ref 0.44–1.00)
GFR, Est AFR Am: >60 mL/min (ref >60)
GFR, Estimated: >60 mL/min (ref >60)
Glucose, Bld: 97 mg/dL (ref 70–99)
Potassium: 3.7 mmol/L (ref 3.5–5.1)
Sodium: 141 mmol/L (ref 135–145)
Total Bilirubin: 0.4 mg/dL (ref 0.3–1.2)
Total Protein: 7.1 g/dL (ref 6.5–8.1)

## 2018-04-22 LAB — CBC
HCT: 35.2 % — ABNORMAL LOW (ref 36.0–46.0)
Hemoglobin: 11.5 g/dL — ABNORMAL LOW (ref 12.0–15.0)
MCH: 30.1 pg (ref 26.0–34.0)
MCHC: 32.7 g/dL (ref 30.0–36.0)
MCV: 92.1 fL (ref 80.0–100.0)
Platelets: 309 10*3/uL (ref 150–400)
RBC: 3.82 MIL/uL — ABNORMAL LOW (ref 3.87–5.11)
RDW: 14.4 % (ref 11.5–15.5)
WBC: 12.1 10*3/uL — ABNORMAL HIGH (ref 4.0–10.5)
nRBC: 0 % (ref 0.0–0.2)

## 2018-04-22 MED ORDER — SODIUM CHLORIDE 0.9 % IV SOLN
Freq: Once | INTRAVENOUS | Status: AC
  Administered 2018-04-22: 11:00:00 via INTRAVENOUS
  Filled 2018-04-22: qty 250

## 2018-04-22 MED ORDER — DIPHENHYDRAMINE HCL 25 MG PO CAPS
25.0000 mg | ORAL_CAPSULE | Freq: Once | ORAL | Status: AC
  Administered 2018-04-22: 25 mg via ORAL

## 2018-04-22 MED ORDER — NYSTATIN 100000 UNIT/ML MT SUSP: 5.0000 mL | Freq: Four times a day (QID) | OROMUCOSAL | 0 refills | Status: DC

## 2018-04-22 MED ORDER — SODIUM CHLORIDE 0.9 % IV SOLN
75.0000 mg/m2 | Freq: Once | INTRAVENOUS | Status: AC
  Administered 2018-04-22: 140 mg via INTRAVENOUS
  Filled 2018-04-22: qty 14

## 2018-04-22 MED ORDER — PEGFILGRASTIM 6 MG/0.6ML ~~LOC~~ PSKT
PREFILLED_SYRINGE | SUBCUTANEOUS | Status: AC
  Filled 2018-04-22: qty 0.6

## 2018-04-22 MED ORDER — SODIUM CHLORIDE 0.9 % IV SOLN
Freq: Once | INTRAVENOUS | Status: AC
  Administered 2018-04-22: 11:00:00 via INTRAVENOUS
  Filled 2018-04-22: qty 5

## 2018-04-22 MED ORDER — ONDANSETRON HCL 8 MG PO TABS: 8.0000 mg | ORAL_TABLET | Freq: Three times a day (TID) | ORAL | 0 refills | Status: DC | PRN

## 2018-04-22 MED ORDER — ACETAMINOPHEN 325 MG PO TABS
ORAL_TABLET | ORAL | Status: AC
  Filled 2018-04-22: qty 2

## 2018-04-22 MED ORDER — SODIUM CHLORIDE 0.9% FLUSH
10.0000 mL | INTRAVENOUS | Status: DC | PRN
  Administered 2018-04-22: 10 mL
  Filled 2018-04-22: qty 10

## 2018-04-22 MED ORDER — PALONOSETRON HCL INJECTION 0.25 MG/5ML
INTRAVENOUS | Status: AC
  Filled 2018-04-22: qty 5

## 2018-04-22 MED ORDER — HEPARIN SOD (PORK) LOCK FLUSH 100 UNIT/ML IV SOLN
500.0000 [IU] | Freq: Once | INTRAVENOUS | Status: AC | PRN
  Administered 2018-04-22: 500 [IU]
  Filled 2018-04-22: qty 5

## 2018-04-22 MED ORDER — SODIUM CHLORIDE 0.9 % IV SOLN
640.0000 mg | Freq: Once | INTRAVENOUS | Status: AC
  Administered 2018-04-22: 640 mg via INTRAVENOUS
  Filled 2018-04-22: qty 64

## 2018-04-22 MED ORDER — PEGFILGRASTIM 6 MG/0.6ML ~~LOC~~ PSKT
6.0000 mg | PREFILLED_SYRINGE | Freq: Once | SUBCUTANEOUS | Status: AC
  Administered 2018-04-22: 6 mg via SUBCUTANEOUS

## 2018-04-22 MED ORDER — PALONOSETRON HCL INJECTION 0.25 MG/5ML
0.2500 mg | Freq: Once | INTRAVENOUS | Status: AC
  Administered 2018-04-22: 0.25 mg via INTRAVENOUS

## 2018-04-22 MED ORDER — DIPHENHYDRAMINE HCL 25 MG PO CAPS
ORAL_CAPSULE | ORAL | Status: AC
  Filled 2018-04-22: qty 1

## 2018-04-22 MED ORDER — SODIUM CHLORIDE 0.9% FLUSH
10.0000 mL | Freq: Once | INTRAVENOUS | Status: AC
  Administered 2018-04-22: 10 mL
  Filled 2018-04-22: qty 10

## 2018-04-22 MED ORDER — CHOLESTYRAMINE 4 G PO PACK: 4.0000 g | PACK | Freq: Two times a day (BID) | ORAL | 0 refills | Status: DC

## 2018-04-22 MED ORDER — ACETAMINOPHEN 325 MG PO TABS
650.0000 mg | ORAL_TABLET | Freq: Once | ORAL | Status: AC
  Administered 2018-04-22: 650 mg via ORAL

## 2018-04-22 MED ORDER — TRASTUZUMAB CHEMO 150 MG IV SOLR
450.0000 mg | Freq: Once | INTRAVENOUS | Status: AC
  Administered 2018-04-22: 450 mg via INTRAVENOUS
  Filled 2018-04-22: qty 21.43

## 2018-04-22 NOTE — Progress Notes (Signed)
[facility]  [phone] [phone]     ID: Monica [name] DOB: [birth year]  MR#: [id number]  XYI#:[id number]  Patient Care Team: [name], PA-C as PCP - General (Family Medicine) [name], MD as Consulting Physician (General Surgery) Magrinat, [name], MD as Consulting Physician (Oncology) [name], MD as Consulting Physician (Radiation Oncology) [name], MD as Consulting Physician (Obstetrics and Gynecology) OTHER MD: [facility]  CHIEF COMPLAINT: triple positive breast cancer  CURRENT TREATMENT: adjuvant chemo-immunotherapy  HISTORY OF CURRENT ILLNESS: From the original intake note:  Monica [name] (pronounced "uric") palpated a right supraclavicular mass and felt tenderness after stretching one day. She brought this to her PA's attention.  The patient underwent bilateral diagnostic mammography with tomography and right breast ultrasonography at [facility] on 12/20/2017 showing: breast density category C. There was a suspicious palpable mass at the 1 o'clock position upper inner quadrant measuring 1.4 x 1.3 x 1.3 cm located 12 cm from the nipple. There was an additional mass at the 8 o'clock radiant measuring 1.2 x 1.0 x 1.4 cm, which likely represented a cyst. Ultrasound evaluation of the right axilla demonstrates no evidence of lymphadenopathy  Accordingly on 12/29/2017 she proceeded to biopsy of the right breast area in question. The pathology from this procedure showed ([id number]): Invasive ductal carcinoma, grade III. Prognostic indicators significant for: estrogen receptor, 80% positive and progesterone receptor, 70% positive, both with strong staining intensity. Proliferation marker Ki67 at 80%. HER2 amplified with ratios HER2/CEP17 signals 3.44 and average HER2 copies per cell 10.65.  The patient's subsequent history is as detailed below.  INTERVAL HISTORY: Monica [name] returns today for follow up and  treatment of her triple positive breast cancer accompanied by her mother-in-law, Monica [name]. She started adjuvant chemotherapy on 03/11/2018 with carboplatin, docetaxel, trastuzumab, and pertuzumab given every 21 days x 6 cycles. (Pertuzumab omitted after cycle 1 due to diarrhea).  Today is day 1 cycle 3.   REVIEW OF SYSTEMS: Monica [name] is doing moderately well today.  She had diarrhea with the last cycle and thinks it may be related to the Dexamethasone.  She noted diarrhea already this morning when she came in, and all she had yesterday was the dexamethasone.  She doesn't describe it as watery at this point.  Just loose.  She already took two imodium today.    Monica [name] notes that she has continued issues with thrush.  She is taking Diflucan 177m per day and she takes that starting on chemo day and takes until the thrush resolves.  She wants to know if there is anything else to do for the thrush.    Monica [name] denies any peripheral neuropathy whatsoever.  She is without fevers, chills, unusual headaches, nausea, vomiting, or any other issues.  A detailed ROS was otherwise non contributory today.     PAST MEDICAL HISTORY: Past Medical History:  Diagnosis Date  . Arthritis    lower back  . Cancer ([facility]    right breast cancer  . Family history of breast cancer   She notes that she had a heart murmur as a child, which she grew out of.   PAST SURGICAL HISTORY: Past Surgical History:  Procedure Laterality Date  . ABDOMINAL HYSTERECTOMY    . BREAST LUMPECTOMY WITH RADIOACTIVE SEED AND SENTINEL LYMPH NODE BIOPSY Right 02/16/2018   Procedure: RIGHT BREAST LUMPECTOMY WITH RADIOACTIVE SEED AND SENTINEL LYMPH NODE BIOPSY;  Surgeon: CErroll Luna MD;  Location[place] [facility]  Service: General;  Laterality:  Right;  Marland Kitchen HYMENECTOMY    . PORTACATH PLACEMENT Right 02/16/2018   Procedure: INSERTION PORT-A-CATH;  Surgeon: [name], MD;  Location: [facility];  Service: General;   Laterality: Right;  . RE-EXCISION OF BREAST LUMPECTOMY Right 02/22/2018   Procedure: RE-EXCISION OF RIGHT  BREAST LUMPECTOMY;  Surgeon: [name], MD;  Location: [facility];  Service: General;  Laterality: Right;  . REPAIR VAGINAL CUFF N/A 02/07/2017   Procedure: REPAIR VAGINAL CUFF;  Surgeon: Ena Dawley, MD;  Location: [facility];  Service: Gynecology;  Laterality: N/A;  . ROBOTIC ASSISTED TOTAL HYSTERECTOMY WITH SALPINGECTOMY Left 01/20/2017   Procedure: ROBOTIC ASSISTED TOTAL HYSTERECTOMY WITH SALPINGECTOMY;  Surgeon: [name], MD;  Location: [facility];  Service: Gynecology;  Laterality: Left;  Partial Hysterectomy without BSO  FAMILY HISTORY Family History  Problem Relation Age of Onset  . Lung cancer Maternal Grandfather   . Esophageal cancer Paternal Grandfather 30  . Breast cancer Cousin 67  As of July 2019, the patient's father is alive at 47. The patient's mother is also alive at 110. The patient has 1 brother and 5 sisters. There was a maternal grandfather diagnosed with lung cancer at 21. There was a paternal grandfather diagnosed with esophageal cancer at 34. The patient's father had a pre-cancerous esophageal finding at age 82. There was also a maternal 1st cousin diagnosed with metastatic breast cancer at age 61.    GYNECOLOGIC HISTORY:  Patient's last menstrual period was 12/21/2016 (exact date). Menarche: 47 years old Age at first live birth: 47 years old She is GXP2. She is status post partial hysterectomy without BSO in 2018 She never used HRT. She used oral contraception over 21 years ago with no complications.   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 The patient is separated from her husband, Monica [name]. The patient's son Monica [name] age 44 is in the [employer] stationed in [place] in the Horticulturist, commercial. He plans on working in the railroad industry after he returns. The patient's daughter Monica [name] age 67,  works at [employer].      ADVANCED DIRECTIVES: Not in place   HEALTH MAINTENANCE: Social History   Tobacco Use  . Smoking status: Never Smoker  . Smokeless tobacco: Never Used  Substance Use Topics  . Alcohol use: Yes    Comment: occ  . Drug use: No     Colonoscopy:   PAP: 2018/ prior to hysterectomy  Bone density:   No Known Allergies  Current Outpatient Medications  Medication Sig Dispense Refill  . Acetaminophen (TYLENOL PO) Take by mouth 3 (three) times daily. 2 tabs as needed for aches and pains    . dexamethasone (DECADRON) 4 MG tablet Take 2 tablets (8 mg total) by mouth 2 (two) times daily. Start the day before Taxotere. Take once the day after, then 2 times a day x 2d. 30 tablet 1  . fluconazole (DIFLUCAN) 100 MG tablet Take 1 tablet (100 mg total) by mouth daily. 20 tablet 1  . ibuprofen (ADVIL,MOTRIN) 800 MG tablet Take 1 tablet (800 mg total) by mouth every 8 (eight) hours as needed. 30 tablet 0  . lidocaine-prilocaine (EMLA) cream Apply to affected area once 30 g 3  . loratadine (CLARITIN) 10 MG tablet Take 10 mg by mouth daily.    Marland Kitchen LORazepam (ATIVAN) 0.5 MG tablet TAKE 1 TABLET (0.5 MG TOTAL) BY MOUTH AT BEDTIME AS NEEDED (NAUSEA OR VOMITING). 30 tablet 0  .  magic mouthwash w/lidocaine SOLN Take 5 mLs by mouth 4 (four) times daily as needed for mouth pain. Swish, Swallow, or spit. 240 mL 1  . NAPROXEN PO Take by mouth as needed. 1 tab as needed for aches and pain    . oxyCODONE (OXY IR/ROXICODONE) 5 MG immediate release tablet Take 1 tablet (5 mg total) by mouth every 6 (six) hours as needed for severe pain. 15 tablet 0  . Probiotic Product (PROBIOTIC PO) Take by mouth daily.    . prochlorperazine (COMPAZINE) 10 MG tablet Take 1 tablet (10 mg total) by mouth every 6 (six) hours as needed (Nausea or vomiting). 30 tablet 1  . nystatin (MYCOSTATIN) 100000 UNIT/ML suspension Take 5 mLs (500,000 Units total) by mouth 4 (four) times daily. 60 mL 0   No current  facility-administered medications for this visit.     OBJECTIVE:   Vitals:   04/22/18 0937  BP: (!) 141/96  Pulse: 63  Resp: 18  Temp: 98.3 F (36.8 C)  SpO2: 100%     Body mass index is 24.67 kg/m.   Wt Readings from Last 3 Encounters:  04/22/18 159 lb 14.4 oz (72.5 kg)  04/08/18 161 lb 4.8 oz (73.2 kg)  04/01/18 164 lb 1.6 oz (74.4 kg)  ECOG FS:1 GENERAL: Patient is a well appearing female in no acute distress HEENT:  Sclerae anicteric.  Oropharynx clear and moist. Small ulcer still noted under left lateral tongue, no evidence of oropharyngeal candidiasis. Neck is supple.  NODES:  No cervical, supraclavicular, or axillary lymphadenopathy palpated.  BREAST EXAM:  Deferred, examined right axilla, still draining clear fluid, however healing well, no sign of infection or swelling LUNGS:  Clear to auscultation bilaterally.  No wheezes or rhonchi. HEART:  Regular rate and rhythm. No murmur appreciated. ABDOMEN:  Soft, nontender.  Positive, normoactive bowel sounds. No organomegaly palpated. MSK:  No focal spinal tenderness to palpation. Full range of motion bilaterally in the upper extremities. EXTREMITIES:  No peripheral edema.   SKIN:  Clear with no obvious rashes or skin changes. No nail dyscrasia. NEURO:  Nonfocal. Well oriented.  Appropriate affect.    LAB RESULTS:  CMP     Component Value Date/Time   NA 138 04/08/2018 1451   K 4.5 04/08/2018 1451   CL 98 04/08/2018 1451   CO2 31 04/08/2018 1451   GLUCOSE 94 04/08/2018 1451   BUN 10 04/08/2018 1451   CREATININE 0.75 04/08/2018 1451   CALCIUM 10.5 (H) 04/08/2018 1451   PROT 7.5 04/08/2018 1451   ALBUMIN 4.4 04/08/2018 1451   AST 43 (H) 04/08/2018 1451   ALT 83 (H) 04/08/2018 1451   ALKPHOS 91 04/08/2018 1451   BILITOT 0.4 04/08/2018 1451   GFRNONAA >60 04/08/2018 1451   GFRAA >60 04/08/2018 1451    No results found for: TOTALPROTELP, ALBUMINELP, A1GS, A2GS, BETS, BETA2SER, GAMS, MSPIKE, SPEI  No results  found for: KPAFRELGTCHN, LAMBDASER, KAPLAMBRATIO  Lab Results  Component Value Date   WBC 12.1 (H) 04/22/2018   NEUTROABS 14.7 (H) 04/08/2018   HGB 11.5 (L) 04/22/2018   HCT 35.2 (L) 04/22/2018   MCV 92.1 04/22/2018   PLT 309 04/22/2018    _0 @  No results found for: LABCA2  No components found for: [id number]  No results for input(s): INR in the last 168 hours.  No results found for: LABCA2  No results found for: IOE703  No results found for: JKK938  No results found for: HWE993  No results found for:  IX7847  No components found for: HGQUANT  No results found for: CEA1 / No results found for: CEA1   No results found for: AFPTUMOR  No results found for: [place]  No results found for: PSA1  Appointment on 04/22/2018  Component Date Value Ref Range Status  . WBC 04/22/2018 12.1* 4.0 - 10.5 K/uL Final  . RBC 04/22/2018 3.82* 3.87 - 5.11 MIL/uL Final  . Hemoglobin 04/22/2018 11.5* 12.0 - 15.0 g/dL Final  . HCT 04/22/2018 35.2* 36.0 - 46.0 % Final  . MCV 04/22/2018 92.1  80.0 - 100.0 fL Final  . MCH 04/22/2018 30.1  26.0 - 34.0 pg Final  . MCHC 04/22/2018 32.7  30.0 - 36.0 g/dL Final  . RDW 04/22/2018 14.4  11.5 - 15.5 % Final  . Platelets 04/22/2018 309  150 - 400 K/uL Final  . nRBC 04/22/2018 0.0  0.0 - 0.2 % Final   Performed at [facility], [place] [address] [place], [place] [address]    (this displays the last labs from the last 3 days)  No results found for: TOTALPROTELP, ALBUMINELP, A1GS, A2GS, BETS, BETA2SER, GAMS, MSPIKE, SPEI (this displays SPEP labs)  No results found for: KPAFRELGTCHN, LAMBDASER, KAPLAMBRATIO (kappa/lambda light chains)  No results found for: HGBA, HGBA2QUANT, HGBFQUANT, HGBSQUAN (Hemoglobinopathy evaluation)   No results found for: LDH  No results found for: IRON, TIBC, IRONPCTSAT (Iron and TIBC)  No results found for: FERRITIN  Urinalysis No results found for: COLORURINE,  APPEARANCEUR, LABSPEC, PHURINE, GLUCOSEU, HGBUR, BILIRUBINUR, KETONESUR, PROTEINUR, UROBILINOGEN, NITRITE, LEUKOCYTESUR   STUDIES: No results found.  ELIGIBLE FOR AVAILABLE RESEARCH PROTOCOL: BCEP  ASSESSMENT: 47 y.o.  [place], [place] woman status post right breast upper inner quadrant biopsy 12/29/2017, for a clinical T1c N0, stage IA invasive ductal carcinoma, triple positive, with an MIB-1 of 80%.  (1) genetics testing 02/02/2018 though the CancerNext gene panel offered by [facility] showed no deleterious mutations in  APC, ATM, BARD1, BMPR1A,BRCA1, BRCA2, BRIP1, CDH1, CDK4, CDKN2A, CHEK2, DICER1, HOXB13, MLH1, MRE11A, MSH2, MSH6, MUTYH, NBN, NF1, PALB2, PMS2, POLD1, POLE, PTEN, RAD50, RAD51C, RAD51D, SMAD4, SMARCA4, STK11 and TP53 (sequencing and deletion/duplication); EPCAM and GREM1 (deletion/duplication only).  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>A) )   (2) right lumpectomy and sentinel lymph node sampling 02/16/2018 showed a pT2 pN0, stage IB invasive ductal carcinoma, grade 3, with a positive inferior margin; a total of 5 lymph nodes were removed  (a) additional surgery 02/27/2018 clear the margins  (3) started carboplatin, docetaxel, trastuzumab and Pertuzumab 03/11/2018, to be repeated every 21 days x 6.  Pertuzumab omitted after cycle 1 due to diarrhea.  (4) continue trastuzumab to total 6 months  (a) echocardiogram 01/21/2018 showed an ejection fraction in the 55-60% range  (5) adjuvant radiation to follow  (6) antiestrogens to start at the completion of local treatment  PLAN:  Monica [name] is doing moderately well today.  She is due for her next cycle of chemotherapy with Docetaxel, Carboplatin, and Trastuzumab.  She is not having any peripheral neuropathy.  This is good.  Her labs are stable and I reviewed those with her in detail.  She is experiencing diarrhea.  She will start Questran daily starting today for five days.  I wrote the details  of how to take it on her medication list.  In regards to the dexamethasone and its contribution to diarrhea, she does not have to take the dexamethasone following chemotherapy.  She will continue to  take it the day prior.  She also has imodium she can take if needed.  She is also having a hard time with continued thrush.  She will take the diflucan 232m x 3 days.  I also sent in some nystatin for thrush.    I noted she has an appointment with Dr. MAundra Dubinin cardiology 12/3, roughly three months after starting her treatment.  This will include an echocardiogram.  Teghan will return in one week for labs and f/u with Dr. MJana Hakim  She knows to call for any questions or concerns prior to her next appointment.    A total of (30) minutes of face-to-face time was spent with this patient with greater than 50% of that time in counseling and care-coordination.  LWilber Bihari NP 04/22/18 10:01 AM Medical Oncology and Hematology CP[facility] Center[address] LaneAFennimore Doolittle [id number]. [phone]   Fax. [phone]

## 2018-04-22 NOTE — Patient Instructions (Signed)
[facility] Discharge Instructions for Patients Receiving Chemotherapy  Today you received the following chemotherapy agents: herceptin, Taxotere, Carboplatin, Onpro  To help prevent nausea and vomiting after your treatment, we encourage you to take your nausea medication as directed.   If you develop nausea and vomiting that is not controlled by your nausea medication, call the clinic.   BELOW ARE SYMPTOMS THAT SHOULD BE REPORTED IMMEDIATELY:  *FEVER GREATER THAN 100.5 F  *CHILLS WITH OR WITHOUT FEVER  NAUSEA AND VOMITING THAT IS NOT CONTROLLED WITH YOUR NAUSEA MEDICATION  *UNUSUAL SHORTNESS OF BREATH  *UNUSUAL BRUISING OR BLEEDING  TENDERNESS IN MOUTH AND THROAT WITH OR WITHOUT PRESENCE OF ULCERS  *URINARY PROBLEMS  *BOWEL PROBLEMS  UNUSUAL RASH Items with * indicate a potential emergency and should be followed up as soon as possible.  Feel free to call the clinic should you have any questions or concerns. The clinic phone number is [phone].  Please show the [facility] at check-in to the Emergency Department and triage nurse.

## 2018-04-25 ENCOUNTER — Telehealth: Payer: Self-pay | Admitting: Adult Health

## 2018-04-25 NOTE — Telephone Encounter (Signed)
Per 10/11 no los

## 2018-04-28 NOTE — Progress Notes (Signed)
Monica [name]  [phone] [phone]     ID: Monica [name] DOB: 07-01-1971  MR#: [id number]  SJG#:[id number]  Patient Care Team: Monica Stalker, PA-C as PCP - General (Family Medicine) Monica Luna, MD as Consulting Physician (General Surgery) Monica [name], Monica Dad, MD as Consulting Physician (Oncology) Monica Rudd, MD as Consulting Physician (Radiation Oncology) Monica Louis, MD as Consulting Physician (Obstetrics and Gynecology) OTHER MD: [facility]  CHIEF COMPLAINT: triple positive breast cancer  CURRENT TREATMENT: adjuvant chemo-immunotherapy  HISTORY OF CURRENT ILLNESS: From the original intake note:  Monica [name] (pronounced "uric") palpated a right supraclavicular mass and felt tenderness after stretching one day. She brought this to her PA's attention.  The patient underwent bilateral diagnostic mammography with tomography and right breast ultrasonography at [facility] on 12/20/2017 showing: breast density category C. There was a suspicious palpable mass at the 1 o'clock position upper inner quadrant measuring 1.4 x 1.3 x 1.3 cm located 12 cm from the nipple. There was an additional mass at the 8 o'clock radiant measuring 1.2 x 1.0 x 1.4 cm, which likely represented a cyst. Ultrasound evaluation of the right axilla demonstrates no evidence of lymphadenopathy  Accordingly on 12/29/2017 she proceeded to biopsy of the right breast area in question. The pathology from this procedure showed ([id number]): Invasive ductal carcinoma, grade III. Prognostic indicators significant for: estrogen receptor, 80% positive and progesterone receptor, 70% positive, both with strong staining intensity. Proliferation marker Ki67 at 80%. HER2 amplified with ratios HER2/CEP17 signals 3.44 and average HER2 copies per cell 10.65.  The patient's subsequent history is as detailed below.  INTERVAL HISTORY: Monica [name] returns today for follow up and  treatment of her triple positive breast cancer. She receives carboplatin, docetaxel, trastuzumab, and pertuzumab given every 21 days. Today is day 8 cycle 3 of 6 planned cycles.   She did not have thrush but she did have a feeling that there was a film in her mouth for the first several days after chemo.  Of course her taste is altered.  That however improved so that by the third week she is tasting fairly normally.  She is taking showers once a day instead of twice a day so her skin does not get so dry.  She is using some coconut oil and other creams.  On day 5 and 6 she finds her hands are clumsy.  She has difficulty writing things she is not sure if this is weakness overall because those are the days when she feels most tired, or if there is actually a dexterity issue.  There is absolutely no peripheral neuropathy.  REVIEW OF SYSTEMS: Monica [name] developed some lesions in the right lower extremity and 1 of them in the right upper extremity (she did not show me that 1), which I imaged below.  They certainly do look like insect bites.  She walks her dogs but does not do yard work.  Her dogs do not have fleas and there are no bedbugs that she is aware of.  She has some blurred vision for a day or 2 after each chemotherapy treatment.  She is alternating mild constipation with mild loose stools depending on how which drugs she is taking.  Otherwise a detailed review of systems today was noncontributory   PAST MEDICAL HISTORY: Past Medical History:  Diagnosis Date  . Arthritis    lower back  . Cancer [facility])    right breast cancer  . Family history of breast cancer   She  notes that she had a heart murmur as a child, which she grew out of.   PAST SURGICAL HISTORY: Past Surgical History:  Procedure Laterality Date  . ABDOMINAL HYSTERECTOMY    . BREAST LUMPECTOMY WITH RADIOACTIVE SEED AND SENTINEL LYMPH NODE BIOPSY Right 02/16/2018   Procedure: RIGHT BREAST LUMPECTOMY WITH RADIOACTIVE SEED AND SENTINEL  LYMPH NODE BIOPSY;  Surgeon: Monica Luna, MD;  Location: [facility];  Service: General;  Laterality: Right;  . HYMENECTOMY    . PORTACATH PLACEMENT Right 02/16/2018   Procedure: INSERTION PORT-A-CATH;  Surgeon: Monica Luna, MD;  Location: [facility];  Service: General;  Laterality: Right;  . RE-EXCISION OF BREAST LUMPECTOMY Right 02/22/2018   Procedure: RE-EXCISION OF RIGHT  BREAST LUMPECTOMY;  Surgeon: Monica Luna, MD;  Location: [facility];  Service: General;  Laterality: Right;  . REPAIR VAGINAL CUFF N/A 02/07/2017   Procedure: REPAIR VAGINAL CUFF;  Surgeon: Monica Dawley, MD;  Location: [facility];  Service: Gynecology;  Laterality: N/A;  . ROBOTIC ASSISTED TOTAL HYSTERECTOMY WITH SALPINGECTOMY Left 01/20/2017   Procedure: ROBOTIC ASSISTED TOTAL HYSTERECTOMY WITH SALPINGECTOMY;  Surgeon: Monica Louis, MD;  Location: [facility];  Service: Gynecology;  Laterality: Left;  Partial Hysterectomy without BSO  FAMILY HISTORY Family History  Problem Relation Age of Onset  . Lung cancer Maternal Grandfather   . Esophageal cancer Paternal Grandfather 50  . Breast cancer Cousin 44  As of July 2019, the patient's father is alive at 43. The patient's mother is also alive at 31. The patient has 1 brother and 5 sisters. There was a maternal grandfather diagnosed with lung cancer at 61. There was a paternal grandfather diagnosed with esophageal cancer at 35. The patient's father had a pre-cancerous esophageal finding at age 79. There was also a maternal 1st cousin diagnosed with metastatic breast cancer at age 85.    GYNECOLOGIC HISTORY:  Patient's last menstrual period was 12/21/2016 (exact date). Menarche: 47 years old Age at first live birth: 47 years old She is GXP2. She is status post partial hysterectomy without BSO in 2018 She never used HRT. She used oral contraception over 21 years ago with no complications.   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 The patient is separated from her husband, Monica [name]. The patient's son Monica [name] age 55 is in the [employer] stationed in [place] in the Horticulturist, commercial. He plans on working in the railroad industry after he returns. The patient's daughter Monica [name] age 2, works at [employer].      ADVANCED DIRECTIVES: Not in place   HEALTH MAINTENANCE: Social History   Tobacco Use  . Smoking status: Never Smoker  . Smokeless tobacco: Never Used  Substance Use Topics  . Alcohol use: Yes    Comment: occ  . Drug use: No     Colonoscopy:   PAP: 2018/ prior to hysterectomy  Bone density:   No Known Allergies  Current Outpatient Medications  Medication Sig Dispense Refill  . Acetaminophen (TYLENOL PO) Take by mouth 3 (three) times daily. 2 tabs as needed for aches and pains    . cholestyramine (QUESTRAN) 4 g packet Take 1 packet (4 g total) by mouth 2 (two) times daily. 60 each 0  . dexamethasone (DECADRON) 4 MG tablet Take 2 tablets (8 mg total) by mouth 2 (two) times daily. Start the day before Taxotere. Take once the day after, then 2 times a day x 2d. 30 tablet  1  . fluconazole (DIFLUCAN) 100 MG tablet Take 1 tablet (100 mg total) by mouth daily. 20 tablet 1  . ibuprofen (ADVIL,MOTRIN) 800 MG tablet Take 1 tablet (800 mg total) by mouth every 8 (eight) hours as needed. 30 tablet 0  . lidocaine-prilocaine (EMLA) cream Apply to affected area once 30 g 3  . loratadine (CLARITIN) 10 MG tablet Take 10 mg by mouth daily.    Marland Kitchen LORazepam (ATIVAN) 0.5 MG tablet TAKE 1 TABLET (0.5 MG TOTAL) BY MOUTH AT BEDTIME AS NEEDED (NAUSEA OR VOMITING). 30 tablet 0  . magic mouthwash w/lidocaine SOLN Take 5 mLs by mouth 4 (four) times daily as needed for mouth pain. Swish, Swallow, or spit. 240 mL 1  . NAPROXEN PO Take by mouth as needed. 1 tab as needed for aches and pain    . nystatin (MYCOSTATIN) 100000 UNIT/ML suspension Take 5 mLs (500,000 Units total) by  mouth 4 (four) times daily. 60 mL 0  . ondansetron (ZOFRAN) 8 MG tablet Take 1 tablet (8 mg total) by mouth every 8 (eight) hours as needed for nausea or vomiting. 20 tablet 0  . oxyCODONE (OXY IR/ROXICODONE) 5 MG immediate release tablet Take 1 tablet (5 mg total) by mouth every 6 (six) hours as needed for severe pain. 15 tablet 0  . Probiotic Product (PROBIOTIC PO) Take by mouth daily.    . prochlorperazine (COMPAZINE) 10 MG tablet Take 1 tablet (10 mg total) by mouth every 6 (six) hours as needed (Nausea or vomiting). 30 tablet 1   No current facility-administered medications for this visit.     OBJECTIVE: Middle-aged white woman in no acute distress  Vitals:   04/29/18 1253  BP: 120/72  Pulse: 67  Resp: 18  Temp: 98.7 F (37.1 C)  SpO2: 100%     Body mass index is 24.1 kg/m.   Wt Readings from Last 3 Encounters:  04/29/18 156 lb 3.2 oz (70.9 kg)  04/22/18 159 lb 14.4 oz (72.5 kg)  04/08/18 161 lb 4.8 oz (73.2 kg)  ECOG FS:1  Sclerae unicteric, EOMs intact Oropharynx clear and moist No cervical or supraclavicular adenopathy Lungs no rales or rhonchi Heart regular rate and rhythm Abd soft, nontender, positive bowel sounds MSK no focal spinal tenderness, no upper extremity lymphedema Neuro: nonfocal, well oriented, appropriate affect Breasts: The incision in the right axilla is almost completely closed, with a 2 mm very shallow area of dehiscence.  There is minimal erythema.  The right breast is otherwise unremarkable as is the left breast and left axilla Skin: The lesions in her right lower extremity are imaged below.  Right lower extremity 04/29/2018     LAB RESULTS:  CMP     Component Value Date/Time   NA 137 04/29/2018 1235   K 4.0 04/29/2018 1235   CL 102 04/29/2018 1235   CO2 26 04/29/2018 1235   GLUCOSE 81 04/29/2018 1235   BUN 8 04/29/2018 1235   CREATININE 0.81 04/29/2018 1235   CALCIUM 9.9 04/29/2018 1235   PROT 7.0 04/29/2018 1235   ALBUMIN 4.2  04/29/2018 1235   AST 30 04/29/2018 1235   ALT 57 (H) 04/29/2018 1235   ALKPHOS 101 04/29/2018 1235   BILITOT 0.5 04/29/2018 1235   GFRNONAA >60 04/29/2018 1235   GFRAA >60 04/29/2018 1235    No results found for: TOTALPROTELP, ALBUMINELP, A1GS, A2GS, BETS, BETA2SER, GAMS, MSPIKE, SPEI  No results found for: KPAFRELGTCHN, LAMBDASER, KAPLAMBRATIO  Lab Results  Component Value Date  WBC 23.3 (H) 04/29/2018   NEUTROABS 16.2 (H) 04/29/2018   HGB 11.4 (L) 04/29/2018   HCT 34.4 (L) 04/29/2018   MCV 93.0 04/29/2018   PLT 247 04/29/2018    _0 @  No results found for: LABCA2  No components found for: HKVQQV956  No results for input(s): INR in the last 168 hours.  No results found for: LABCA2  No results found for: LOV564  No results found for: PPI951  No results found for: OAC166  No results found for: CA2729  No components found for: HGQUANT  No results found for: CEA1 / No results found for: CEA1   No results found for: AFPTUMOR  No results found for: [place]  No results found for: PSA1  Appointment on 04/29/2018  Component Date Value Ref Range Status  . WBC Count 04/29/2018 23.3* 4.0 - 10.5 K/uL Final  . RBC 04/29/2018 3.70* 3.87 - 5.11 MIL/uL Final  . Hemoglobin 04/29/2018 11.4* 12.0 - 15.0 g/dL Final  . HCT 04/29/2018 34.4* 36.0 - 46.0 % Final  . MCV 04/29/2018 93.0  80.0 - 100.0 fL Final  . MCH 04/29/2018 30.8  26.0 - 34.0 pg Final  . MCHC 04/29/2018 33.1  30.0 - 36.0 g/dL Final  . RDW 04/29/2018 14.5  11.5 - 15.5 % Final  . Platelet Count 04/29/2018 247  150 - 400 K/uL Final  . nRBC 04/29/2018 0.0  0.0 - 0.2 % Final  . Neutrophils Relative % 04/29/2018 69  % Final  . Neutro Abs 04/29/2018 16.2* 1.7 - 7.7 K/uL Final  . Lymphocytes Relative 04/29/2018 10  % Final  . Lymphs Abs 04/29/2018 2.2  0.7 - 4.0 K/uL Final  . Monocytes Relative 04/29/2018 11  % Final  . Monocytes Absolute 04/29/2018 2.6* 0.1 - 1.0 K/uL Final  . Eosinophils  Relative 04/29/2018 0  % Final  . Eosinophils Absolute 04/29/2018 0.0  0.0 - 0.5 K/uL Final  . Basophils Relative 04/29/2018 1  % Final  . Basophils Absolute 04/29/2018 0.1  0.0 - 0.1 K/uL Final  . WBC Morphology 04/29/2018 PATIENTS ON BMS MAY HAVE INCREASED IGS   Corrected  . Immature Granulocytes 04/29/2018 9  % Final  . Abs Immature Granulocytes 04/29/2018 2.14* 0.00 - 0.07 K/uL Final   Performed at [facility], [place] [address], [place], [place] [address]  . Sodium 04/29/2018 137  135 - 145 mmol/L Final  . Potassium 04/29/2018 4.0  3.5 - 5.1 mmol/L Final  . Chloride 04/29/2018 102  98 - 111 mmol/L Final  . CO2 04/29/2018 26  22 - 32 mmol/L Final  . Glucose, Bld 04/29/2018 81  70 - 99 mg/dL Final  . BUN 04/29/2018 8  6 - 20 mg/dL Final  . Creatinine 04/29/2018 0.81  0.44 - 1.00 mg/dL Final  . Calcium 04/29/2018 9.9  8.9 - 10.3 mg/dL Final  . Total Protein 04/29/2018 7.0  6.5 - 8.1 g/dL Final  . Albumin 04/29/2018 4.2  3.5 - 5.0 g/dL Final  . AST 04/29/2018 30  15 - 41 U/L Final  . ALT 04/29/2018 57* 0 - 44 U/L Final  . Alkaline Phosphatase 04/29/2018 101  38 - 126 U/L Final  . Total Bilirubin 04/29/2018 0.5  0.3 - 1.2 mg/dL Final  . GFR, Est Non Af Am 04/29/2018 >60  >60 mL/min Final  . GFR, Est AFR Am 04/29/2018 >60  >60 mL/min Final   Comment: (NOTE) The eGFR has been calculated using the CKD EPI equation. This calculation  has not been validated in all clinical situations. eGFR's persistently <60 mL/min signify possible Chronic Kidney Disease.   Georgiann Hahn gap 04/29/2018 9  5 - 15 Final   Performed at [facility], [place] [address], [place], [place] [address]    (this displays the last labs from the last 3 days)  No results found for: TOTALPROTELP, ALBUMINELP, A1GS, A2GS, BETS, BETA2SER, GAMS, MSPIKE, SPEI (this displays SPEP labs)  No results found for: KPAFRELGTCHN, LAMBDASER, KAPLAMBRATIO (kappa/lambda light chains)  No  results found for: HGBA, HGBA2QUANT, HGBFQUANT, HGBSQUAN (Hemoglobinopathy evaluation)   No results found for: LDH  No results found for: IRON, TIBC, IRONPCTSAT (Iron and TIBC)  No results found for: FERRITIN  Urinalysis No results found for: COLORURINE, APPEARANCEUR, LABSPEC, PHURINE, GLUCOSEU, HGBUR, BILIRUBINUR, KETONESUR, PROTEINUR, UROBILINOGEN, NITRITE, LEUKOCYTESUR   STUDIES: No results found.  ELIGIBLE FOR AVAILABLE RESEARCH PROTOCOL: BCEP  ASSESSMENT: 47 y.o.  [place], [place] woman status post right breast upper inner quadrant biopsy 12/29/2017, for a clinical T1c N0, stage IA invasive ductal carcinoma, triple positive, with an MIB-1 of 80%.  (1) genetics testing 02/02/2018 though the CancerNext gene panel offered by [facility] showed no deleterious mutations in  APC, ATM, BARD1, BMPR1A,BRCA1, BRCA2, BRIP1, CDH1, CDK4, CDKN2A, CHEK2, DICER1, HOXB13, MLH1, MRE11A, MSH2, MSH6, MUTYH, NBN, NF1, PALB2, PMS2, POLD1, POLE, PTEN, RAD50, RAD51C, RAD51D, SMAD4, SMARCA4, STK11 and TP53 (sequencing and deletion/duplication); EPCAM and GREM1 (deletion/duplication only).  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>A) )   (2) right lumpectomy and sentinel lymph node sampling 02/16/2018 showed a pT2 pN0, stage IB invasive ductal carcinoma, grade 3, with a positive inferior margin; a total of 5 lymph nodes were removed  (a) additional surgery 02/27/2018 clear the margins  (3) started carboplatin, docetaxel, trastuzumab and Pertuzumab 03/11/2018, to be repeated every 21 days x 6.    (a) Pertuzumab omitted after cycle 1 due to diarrhea.  (4) continue trastuzumab to total 6 months  (a) echocardiogram 01/21/2018 showed an ejection fraction in the 55-60% range  (b) repeat echocardiogram 06/14/2018 (pending).  (5) adjuvant radiation to follow  (6) antiestrogens to start at the completion of local treatment  PLAN:  Monica [name] is tolerating her treatment remarkably  well.  Importantly there have been no peripheral neuropathy symptoms.  She has learned to troubleshoot the bowel changes and the mouth problems.  I do not have a simple explanation for the lesions in her left lower extremity.  They could well be bug bites.  1 of them she likely secondarily infected by scratching it.  We will simply keep an eye on those for the time being  Reviewed her lab work today which is very favorable.  She will return in 2 weeks for cycle #4.  The plan is to continue total of 6 cycles and then proceed to radiation  She knows to call for any other problems that may develop before the next visit.   Monica [name], Monica Dad, MD  04/29/18 1:22 PM Medical Oncology and Hematology [facility] [address] [place], [place] [address] Tel. [phone]    Fax. [phone]  Alice Rieger, am acting as scribe for Chauncey Cruel MD.  I, Lurline Del MD, have reviewed the above documentation for accuracy and completeness, and I agree with the above.

## 2018-04-29 ENCOUNTER — Telehealth: Payer: Self-pay | Admitting: Oncology

## 2018-04-29 ENCOUNTER — Inpatient Hospital Stay (HOSPITAL_BASED_OUTPATIENT_CLINIC_OR_DEPARTMENT_OTHER): Payer: Managed Care, Other (non HMO) | Admitting: Oncology

## 2018-04-29 ENCOUNTER — Inpatient Hospital Stay: Payer: Managed Care, Other (non HMO)

## 2018-04-29 VITALS — BP 120/72 | HR 67 | Temp 98.7°F | Resp 18 | Ht 67.5 in | Wt 156.2 lb

## 2018-04-29 DIAGNOSIS — C50211 Malignant neoplasm of upper-inner quadrant of right female breast: Secondary | ICD-10-CM | POA: Diagnosis not present

## 2018-04-29 DIAGNOSIS — Z95828 Presence of other vascular implants and grafts: Secondary | ICD-10-CM

## 2018-04-29 DIAGNOSIS — L989 Disorder of the skin and subcutaneous tissue, unspecified: Secondary | ICD-10-CM | POA: Diagnosis not present

## 2018-04-29 DIAGNOSIS — Z170 Estrogen receptor positive status [ER+]: Secondary | ICD-10-CM | POA: Diagnosis not present

## 2018-04-29 DIAGNOSIS — Z801 Family history of malignant neoplasm of trachea, bronchus and lung: Secondary | ICD-10-CM | POA: Diagnosis not present

## 2018-04-29 DIAGNOSIS — Z800 Family history of malignant neoplasm of digestive organs: Secondary | ICD-10-CM

## 2018-04-29 DIAGNOSIS — Z5111 Encounter for antineoplastic chemotherapy: Secondary | ICD-10-CM | POA: Diagnosis not present

## 2018-04-29 DIAGNOSIS — Z803 Family history of malignant neoplasm of breast: Secondary | ICD-10-CM

## 2018-04-29 LAB — CMP (CANCER CENTER ONLY)
ALT: 57 U/L — ABNORMAL HIGH (ref 0–44)
AST: 30 U/L (ref 15–41)
Albumin: 4.2 g/dL (ref 3.5–5.0)
Alkaline Phosphatase: 101 U/L (ref 38–126)
Anion gap: 9 (ref 5–15)
BUN: 8 mg/dL (ref 6–20)
CO2: 26 mmol/L (ref 22–32)
Calcium: 9.9 mg/dL (ref 8.9–10.3)
Chloride: 102 mmol/L (ref 98–111)
Creatinine: 0.81 mg/dL (ref 0.44–1.00)
GFR, Est AFR Am: >60 mL/min (ref >60)
GFR, Estimated: >60 mL/min (ref >60)
Glucose, Bld: 81 mg/dL (ref 70–99)
Potassium: 4 mmol/L (ref 3.5–5.1)
Sodium: 137 mmol/L (ref 135–145)
Total Bilirubin: 0.5 mg/dL (ref 0.3–1.2)
Total Protein: 7 g/dL (ref 6.5–8.1)

## 2018-04-29 LAB — CBC WITH DIFFERENTIAL (CANCER CENTER ONLY)
Abs Immature Granulocytes: 2.14 10*3/uL — ABNORMAL HIGH (ref 0.00–0.07)
Basophils Absolute: 0.1 10*3/uL (ref 0.0–0.1)
Basophils Relative: 1 %
Eosinophils Absolute: 0 10*3/uL (ref 0.0–0.5)
Eosinophils Relative: 0 %
HCT: 34.4 % — ABNORMAL LOW (ref 36.0–46.0)
Hemoglobin: 11.4 g/dL — ABNORMAL LOW (ref 12.0–15.0)
Immature Granulocytes: 9 %
Lymphocytes Relative: 10 %
Lymphs Abs: 2.2 10*3/uL (ref 0.7–4.0)
MCH: 30.8 pg (ref 26.0–34.0)
MCHC: 33.1 g/dL (ref 30.0–36.0)
MCV: 93 fL (ref 80.0–100.0)
Monocytes Absolute: 2.6 10*3/uL — ABNORMAL HIGH (ref 0.1–1.0)
Monocytes Relative: 11 %
Neutro Abs: 16.2 10*3/uL — ABNORMAL HIGH (ref 1.7–7.7)
Neutrophils Relative %: 69 %
Platelet Count: 247 10*3/uL (ref 150–400)
RBC: 3.7 MIL/uL — ABNORMAL LOW (ref 3.87–5.11)
RDW: 14.5 % (ref 11.5–15.5)
WBC Count: 23.3 10*3/uL — ABNORMAL HIGH (ref 4.0–10.5)
WBC Morphology: INCREASED
nRBC: 0 % (ref 0.0–0.2)

## 2018-04-29 MED ORDER — HEPARIN SOD (PORK) LOCK FLUSH 100 UNIT/ML IV SOLN
500.0000 [IU] | Freq: Once | INTRAVENOUS | Status: AC
  Administered 2018-04-29: 500 [IU]
  Filled 2018-04-29: qty 5

## 2018-04-29 MED ORDER — SODIUM CHLORIDE 0.9% FLUSH
10.0000 mL | Freq: Once | INTRAVENOUS | Status: AC
  Administered 2018-04-29: 10 mL
  Filled 2018-04-29: qty 10

## 2018-04-29 NOTE — Telephone Encounter (Signed)
Patient did not need avs, as she stated she is my chart active.  Will reschedule 11/29 appt back to Dr. Jana Hakim when scheduling template opens up for that day.  Sent message to  team lead to inquire about opening up template.

## 2018-04-29 NOTE — Patient Instructions (Signed)
Implanted Port Home Guide An implanted port is a type of central line that is placed under the skin. Central lines are used to provide IV access when treatment or nutrition needs to be given through a person's veins. Implanted ports are used for long-term IV access. An implanted port may be placed because:  You need IV medicine that would be irritating to the small veins in your hands or arms.  You need long-term IV medicines, such as antibiotics.  You need IV nutrition for a long period.  You need frequent blood draws for lab tests.  You need dialysis.  Implanted ports are usually placed in the chest area, but they can also be placed in the upper arm, the abdomen, or the leg. An implanted port has two main parts:  Reservoir. The reservoir is round and will appear as a small, raised area under your skin. The reservoir is the part where a needle is inserted to give medicines or draw blood.  Catheter. The catheter is a thin, flexible tube that extends from the reservoir. The catheter is placed into a large vein. Medicine that is inserted into the reservoir goes into the catheter and then into the vein.  How will I care for my incision site? Do not get the incision site wet. Bathe or shower as directed by your health care provider. How is my port accessed? Special steps must be taken to access the port:  Before the port is accessed, a numbing cream can be placed on the skin. This helps numb the skin over the port site.  Your health care provider uses a sterile technique to access the port. ? Your health care provider must put on a mask and sterile gloves. ? The skin over your port is cleaned carefully with an antiseptic and allowed to dry. ? The port is gently pinched between sterile gloves, and a needle is inserted into the port.  Only "non-coring" port needles should be used to access the port. Once the port is accessed, a blood return should be checked. This helps ensure that the port  is in the vein and is not clogged.  If your port needs to remain accessed for a constant infusion, a clear (transparent) bandage will be placed over the needle site. The bandage and needle will need to be changed every week, or as directed by your health care provider.  Keep the bandage covering the needle clean and dry. Do not get it wet. Follow your health care provider's instructions on how to take a shower or bath while the port is accessed.  If your port does not need to stay accessed, no bandage is needed over the port.  What is flushing? Flushing helps keep the port from getting clogged. Follow your health care provider's instructions on how and when to flush the port. Ports are usually flushed with saline solution or a medicine called heparin. The need for flushing will depend on how the port is used.  If the port is used for intermittent medicines or blood draws, the port will need to be flushed: ? After medicines have been given. ? After blood has been drawn. ? As part of routine maintenance.  If a constant infusion is running, the port may not need to be flushed.  How long will my port stay implanted? The port can stay in for as long as your health care provider thinks it is needed. When it is time for the port to come out, surgery will be   done to remove it. The procedure is similar to the one performed when the port was put in. When should I seek immediate medical care? When you have an implanted port, you should seek immediate medical care if:  You notice a bad smell coming from the incision site.  You have swelling, redness, or drainage at the incision site.  You have more swelling or pain at the port site or the surrounding area.  You have a fever that is not controlled with medicine.  This information is not intended to replace advice given to you by your health care provider. Make sure you discuss any questions you have with your health care provider. Document  Released: 06/29/2005 Document Revised: 12/05/2015 Document Reviewed: 03/06/2013 Elsevier Interactive Patient Education  2017 Elsevier Inc.  

## 2018-04-29 NOTE — Telephone Encounter (Signed)
VG PAL 11/29 - GM patient scheduled to see VG 11/29 in GM's absence. Due to VG now out off office 11/29 patient appointments moved from 11/29 to 12/2 per GM desk nurse. Left message for patient re change with new date/time and patient will get updated schedule at next visit. Also sent staff message to research nurse re change.

## 2018-05-02 ENCOUNTER — Telehealth: Payer: Self-pay | Admitting: *Deleted

## 2018-05-02 ENCOUNTER — Telehealth: Payer: Self-pay | Admitting: Oncology

## 2018-05-02 ENCOUNTER — Other Ambulatory Visit: Payer: Self-pay | Admitting: *Deleted

## 2018-05-02 MED ORDER — CEPHALEXIN 500 MG PO CAPS: 500.0000 mg | ORAL_CAPSULE | Freq: Two times a day (BID) | ORAL | 0 refills | Status: DC

## 2018-05-02 NOTE — Telephone Encounter (Signed)
Received call from patient stating she has another area that has popped up on her right leg. It looks the same as the other areas that Dr. Jana Hakim looked at last week.  Per Dr. Jana Hakim call in [place] 500mg  BID x 5 days and get an appointment with her dermatologist.  Patient verbalized understanding.

## 2018-05-02 NOTE — Telephone Encounter (Signed)
Per staff message from [place] in research moved 12/3 res con f/u with her from 12/3 to 12/2 and cx 12/3 lab. Per Lexine Baton no need to contact patient she is aware.

## 2018-05-10 ENCOUNTER — Other Ambulatory Visit: Payer: Self-pay | Admitting: Oncology

## 2018-05-13 ENCOUNTER — Inpatient Hospital Stay (HOSPITAL_BASED_OUTPATIENT_CLINIC_OR_DEPARTMENT_OTHER): Payer: Managed Care, Other (non HMO) | Admitting: Adult Health

## 2018-05-13 ENCOUNTER — Inpatient Hospital Stay: Payer: Managed Care, Other (non HMO)

## 2018-05-13 ENCOUNTER — Inpatient Hospital Stay: Payer: Managed Care, Other (non HMO) | Attending: Oncology

## 2018-05-13 ENCOUNTER — Encounter: Payer: Self-pay | Admitting: Adult Health

## 2018-05-13 ENCOUNTER — Other Ambulatory Visit: Payer: Self-pay

## 2018-05-13 VITALS — BP 122/76 | HR 63 | Temp 98.7°F | Resp 18 | Ht 67.5 in | Wt 161.7 lb

## 2018-05-13 DIAGNOSIS — Z170 Estrogen receptor positive status [ER+]: Secondary | ICD-10-CM

## 2018-05-13 DIAGNOSIS — Z5112 Encounter for antineoplastic immunotherapy: Secondary | ICD-10-CM | POA: Insufficient documentation

## 2018-05-13 DIAGNOSIS — Z5111 Encounter for antineoplastic chemotherapy: Secondary | ICD-10-CM | POA: Diagnosis present

## 2018-05-13 DIAGNOSIS — Z800 Family history of malignant neoplasm of digestive organs: Secondary | ICD-10-CM | POA: Diagnosis not present

## 2018-05-13 DIAGNOSIS — N951 Menopausal and female climacteric states: Secondary | ICD-10-CM | POA: Insufficient documentation

## 2018-05-13 DIAGNOSIS — C50211 Malignant neoplasm of upper-inner quadrant of right female breast: Secondary | ICD-10-CM

## 2018-05-13 DIAGNOSIS — Z5189 Encounter for other specified aftercare: Secondary | ICD-10-CM | POA: Insufficient documentation

## 2018-05-13 DIAGNOSIS — G620 Drug-induced polyneuropathy: Secondary | ICD-10-CM | POA: Diagnosis not present

## 2018-05-13 DIAGNOSIS — L989 Disorder of the skin and subcutaneous tissue, unspecified: Secondary | ICD-10-CM | POA: Diagnosis not present

## 2018-05-13 DIAGNOSIS — Z803 Family history of malignant neoplasm of breast: Secondary | ICD-10-CM

## 2018-05-13 DIAGNOSIS — T451X5A Adverse effect of antineoplastic and immunosuppressive drugs, initial encounter: Secondary | ICD-10-CM | POA: Insufficient documentation

## 2018-05-13 DIAGNOSIS — R61 Generalized hyperhidrosis: Secondary | ICD-10-CM | POA: Insufficient documentation

## 2018-05-13 DIAGNOSIS — Z801 Family history of malignant neoplasm of trachea, bronchus and lung: Secondary | ICD-10-CM | POA: Insufficient documentation

## 2018-05-13 DIAGNOSIS — Z95828 Presence of other vascular implants and grafts: Secondary | ICD-10-CM

## 2018-05-13 DIAGNOSIS — E876 Hypokalemia: Secondary | ICD-10-CM | POA: Diagnosis not present

## 2018-05-13 LAB — CMP (CANCER CENTER ONLY)
ALT: 78 U/L — ABNORMAL HIGH (ref 0–44)
AST: 33 U/L (ref 15–41)
Albumin: 3.8 g/dL (ref 3.5–5.0)
Alkaline Phosphatase: 73 U/L (ref 38–126)
Anion gap: 11 (ref 5–15)
BUN: 11 mg/dL (ref 6–20)
CO2: 24 mmol/L (ref 22–32)
Calcium: 9.3 mg/dL (ref 8.9–10.3)
Chloride: 108 mmol/L (ref 98–111)
Creatinine: 0.66 mg/dL (ref 0.44–1.00)
GFR, Est AFR Am: >60 mL/min (ref >60)
GFR, Estimated: >60 mL/min (ref >60)
Glucose, Bld: 102 mg/dL — ABNORMAL HIGH (ref 70–99)
Potassium: 3.4 mmol/L — ABNORMAL LOW (ref 3.5–5.1)
Sodium: 143 mmol/L (ref 135–145)
Total Bilirubin: 0.5 mg/dL (ref 0.3–1.2)
Total Protein: 6.6 g/dL (ref 6.5–8.1)

## 2018-05-13 LAB — CBC WITH DIFFERENTIAL (CANCER CENTER ONLY)
Abs Immature Granulocytes: 0.06 10*3/uL (ref 0.00–0.07)
Basophils Absolute: 0 10*3/uL (ref 0.0–0.1)
Basophils Relative: 0 %
Eosinophils Absolute: 0 10*3/uL (ref 0.0–0.5)
Eosinophils Relative: 0 %
HCT: 31.6 % — ABNORMAL LOW (ref 36.0–46.0)
Hemoglobin: 10.2 g/dL — ABNORMAL LOW (ref 12.0–15.0)
Immature Granulocytes: 1 %
Lymphocytes Relative: 20 %
Lymphs Abs: 1.7 10*3/uL (ref 0.7–4.0)
MCH: 30.9 pg (ref 26.0–34.0)
MCHC: 32.3 g/dL (ref 30.0–36.0)
MCV: 95.8 fL (ref 80.0–100.0)
Monocytes Absolute: 0.8 10*3/uL (ref 0.1–1.0)
Monocytes Relative: 9 %
Neutro Abs: 6.2 10*3/uL (ref 1.7–7.7)
Neutrophils Relative %: 70 %
Platelet Count: 183 10*3/uL (ref 150–400)
RBC: 3.3 MIL/uL — ABNORMAL LOW (ref 3.87–5.11)
RDW: 16.2 % — ABNORMAL HIGH (ref 11.5–15.5)
WBC Count: 8.8 10*3/uL (ref 4.0–10.5)
nRBC: 0 % (ref 0.0–0.2)

## 2018-05-13 MED ORDER — SODIUM CHLORIDE 0.9 % IV SOLN
Freq: Once | INTRAVENOUS | Status: AC
  Administered 2018-05-13: 12:00:00 via INTRAVENOUS
  Filled 2018-05-13: qty 5

## 2018-05-13 MED ORDER — PALONOSETRON HCL INJECTION 0.25 MG/5ML
INTRAVENOUS | Status: AC
  Filled 2018-05-13: qty 5

## 2018-05-13 MED ORDER — HEPARIN SOD (PORK) LOCK FLUSH 100 UNIT/ML IV SOLN
500.0000 [IU] | Freq: Once | INTRAVENOUS | Status: AC | PRN
  Administered 2018-05-13: 500 [IU]
  Filled 2018-05-13: qty 5

## 2018-05-13 MED ORDER — NYSTATIN 100000 UNIT/ML MT SUSP: 5.0000 mL | Freq: Four times a day (QID) | OROMUCOSAL | 0 refills | Status: DC

## 2018-05-13 MED ORDER — SODIUM CHLORIDE 0.9% FLUSH
10.0000 mL | Freq: Once | INTRAVENOUS | Status: AC
  Administered 2018-05-13: 10 mL
  Filled 2018-05-13: qty 10

## 2018-05-13 MED ORDER — PEGFILGRASTIM 6 MG/0.6ML ~~LOC~~ PSKT
6.0000 mg | PREFILLED_SYRINGE | Freq: Once | SUBCUTANEOUS | Status: AC
  Administered 2018-05-13: 6 mg via SUBCUTANEOUS

## 2018-05-13 MED ORDER — DIPHENHYDRAMINE HCL 25 MG PO CAPS
25.0000 mg | ORAL_CAPSULE | Freq: Once | ORAL | Status: AC
  Administered 2018-05-13: 25 mg via ORAL

## 2018-05-13 MED ORDER — FLUCONAZOLE 100 MG PO TABS: 100.0000 mg | ORAL_TABLET | Freq: Every day | ORAL | 1 refills | Status: DC

## 2018-05-13 MED ORDER — SODIUM CHLORIDE 0.9 % IV SOLN
Freq: Once | INTRAVENOUS | Status: AC
  Administered 2018-05-13: 12:00:00 via INTRAVENOUS
  Filled 2018-05-13: qty 250

## 2018-05-13 MED ORDER — TRASTUZUMAB CHEMO 150 MG IV SOLR
450.0000 mg | Freq: Once | INTRAVENOUS | Status: AC
  Administered 2018-05-13: 450 mg via INTRAVENOUS
  Filled 2018-05-13: qty 21.43

## 2018-05-13 MED ORDER — PALONOSETRON HCL INJECTION 0.25 MG/5ML
0.2500 mg | Freq: Once | INTRAVENOUS | Status: AC
  Administered 2018-05-13: 0.25 mg via INTRAVENOUS

## 2018-05-13 MED ORDER — DIPHENHYDRAMINE HCL 25 MG PO CAPS
ORAL_CAPSULE | ORAL | Status: AC
  Filled 2018-05-13: qty 1

## 2018-05-13 MED ORDER — ACETAMINOPHEN 325 MG PO TABS
650.0000 mg | ORAL_TABLET | Freq: Once | ORAL | Status: AC
  Administered 2018-05-13: 650 mg via ORAL

## 2018-05-13 MED ORDER — SODIUM CHLORIDE 0.9% FLUSH
10.0000 mL | INTRAVENOUS | Status: DC | PRN
  Administered 2018-05-13: 10 mL
  Filled 2018-05-13: qty 10

## 2018-05-13 MED ORDER — ACETAMINOPHEN 325 MG PO TABS
ORAL_TABLET | ORAL | Status: AC
  Filled 2018-05-13: qty 2

## 2018-05-13 MED ORDER — SODIUM CHLORIDE 0.9 % IV SOLN
75.0000 mg/m2 | Freq: Once | INTRAVENOUS | Status: AC
  Administered 2018-05-13: 140 mg via INTRAVENOUS
  Filled 2018-05-13: qty 14

## 2018-05-13 MED ORDER — PEGFILGRASTIM 6 MG/0.6ML ~~LOC~~ PSKT
PREFILLED_SYRINGE | SUBCUTANEOUS | Status: AC
  Filled 2018-05-13: qty 0.6

## 2018-05-13 MED ORDER — SODIUM CHLORIDE 0.9 % IV SOLN
659.0000 mg | Freq: Once | INTRAVENOUS | Status: AC
  Administered 2018-05-13: 660 mg via INTRAVENOUS
  Filled 2018-05-13: qty 66

## 2018-05-13 NOTE — Progress Notes (Signed)
[name]  [phone] [phone]     ID: Monica [name] DOB: [birth year]  MR#: [id number]  DXI#:[id number]  Patient Care Team: [name], PA-C as PCP - General (Family Medicine) [name], MD as Consulting Physician (General Surgery) Magrinat, [name], MD as Consulting Physician (Oncology) [name], MD as Consulting Physician (Radiation Oncology) [name], MD as Consulting Physician (Obstetrics and Gynecology) OTHER MD: [facility] D[facility]  CHIEF COMPLAINT: triple positive breast cancer  CURRENT TREATMENT: adjuvant chemo-immunotherapy  HISTORY OF CURRENT ILLNESS: From the original intake note:  Monica [name] (pronounced "uric") palpated a right supraclavicular mass and felt tenderness after stretching one day. She brought this to her PA's attention.  The patient underwent bilateral diagnostic mammography with tomography and right breast ultrasonography at [facility] on 12/20/2017 showing: breast density category C. There was a suspicious palpable mass at the 1 o'clock position upper inner quadrant measuring 1.4 x 1.3 x 1.3 cm located 12 cm from the nipple. There was an additional mass at the 8 o'clock radiant measuring 1.2 x 1.0 x 1.4 cm, which likely represented a cyst. Ultrasound evaluation of the right axilla demonstrates no evidence of lymphadenopathy  Accordingly on 12/29/2017 she proceeded to biopsy of the right breast area in question. The pathology from this procedure showed ([id number]): Invasive ductal carcinoma, grade III. Prognostic indicators significant for: estrogen receptor, 80% positive and progesterone receptor, 70% positive, both with strong staining intensity. Proliferation marker Ki67 at 80%. HER2 amplified with ratios HER2/CEP17 signals 3.44 and average HER2 copies per cell 10.65.  The patient's subsequent history is as detailed below.  INTERVAL HISTORY: Monica [name] returns today for follow up and  treatment of her triple positive breast cancer. She receives carboplatin, docetaxel, trastuzumab, and pertuzumab given every 21 days. (Pertuzumab discontinued due to diarrhea).  Today is day 1 cycle 4 of 6 planned cycles.   REVIEW OF SYSTEMS: Monica [name] is doing well today.  At her last appointment she had some skin lesions and was prescribed Keflex.  The lesions worsened and she went to see her PCP and was prescribed a different antibiotic that has been working well.  Her skin lesions are almost completely healed.  She took the last one yesterday and it is improved. She denies peripheral neuropathy.  She is without fevers, chills, nausea, vomiting, bowel/bladder issues.  She does note that her sleep is off, and she is waking up a few times in the middle of the night.   She is feeling better and a detailed ROS is otherwise non contributory.   PAST MEDICAL HISTORY: Past Medical History:  Diagnosis Date  . Arthritis    lower back  . Cancer [facility])    right breast cancer  . Family history of breast cancer   She notes that she had a heart murmur as a child, which she grew out of.   PAST SURGICAL HISTORY: Past Surgical History:  Procedure Laterality Date  . ABDOMINAL HYSTERECTOMY    . BREAST LUMPECTOMY WITH RADIOACTIVE SEED AND SENTINEL LYMPH NODE BIOPSY Right 02/16/2018   Procedure: RIGHT BREAST LUMPECTOMY WITH RADIOACTIVE SEED AND SENTINEL LYMPH NODE BIOPSY;  Surgeon: [name], MD;  Location: [facility];  Service: General;  Laterality: Right;  . HYMENECTOMY    . PORTACATH PLACEMENT Right 02/16/2018   Procedure: INSERTION PORT-A-CATH;  Surgeon: [name], MD;  Location: [facility];  Service: General;  Laterality: Right;  . RE-EXCISION OF BREAST LUMPECTOMY Right 02/22/2018   Procedure: RE-EXCISION  OF RIGHT  BREAST LUMPECTOMY;  Surgeon: [name], MD;  Location: [place];  Service: General;  Laterality: Right;  . REPAIR VAGINAL CUFF N/A  02/07/2017   Procedure: REPAIR VAGINAL CUFF;  Surgeon: Ena Dawley, MD;  Location: [facility];  Service: Gynecology;  Laterality: N/A;  . ROBOTIC ASSISTED TOTAL HYSTERECTOMY WITH SALPINGECTOMY Left 01/20/2017   Procedure: ROBOTIC ASSISTED TOTAL HYSTERECTOMY WITH SALPINGECTOMY;  Surgeon: [name], MD;  Location: [facility];  Service: Gynecology;  Laterality: Left;  Partial Hysterectomy without BSO  FAMILY HISTORY Family History  Problem Relation Age of Onset  . Lung cancer Maternal Grandfather   . Esophageal cancer Paternal Grandfather 69  . Breast cancer Cousin 27  As of July 2019, the patient's father is alive at 52. The patient's mother is also alive at 28. The patient has 1 brother and 5 sisters. There was a maternal grandfather diagnosed with lung cancer at 36. There was a paternal grandfather diagnosed with esophageal cancer at 34. The patient's father had a pre-cancerous esophageal finding at age 65. There was also a maternal 1st cousin diagnosed with metastatic breast cancer at age 41.    GYNECOLOGIC HISTORY:  Patient's last menstrual period was 12/21/2016 (exact date). Menarche: 47 years old Age at first live birth: 47 years old She is GXP2. She is status post partial hysterectomy without BSO in 2018 She never used HRT. She used oral contraception over 21 years ago with no complications.   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 The patient is separated from her husband, [name]. The patient's son Monica [name] age 11 is in the [place] [employer] stationed in [place] in the Horticulturist, commercial. He plans on working in the railroad industry after he returns. The patient's daughter Monica [name] age 41, works at [employer].      ADVANCED DIRECTIVES: Not in place   HEALTH MAINTENANCE: Social History   Tobacco Use  . Smoking status: Never Smoker  . Smokeless tobacco: Never Used  Substance Use Topics  . Alcohol use: Yes    Comment: occ  . Drug use:  No     Colonoscopy:   PAP: 2018/ prior to hysterectomy  Bone density:   No Known Allergies  Current Outpatient Medications  Medication Sig Dispense Refill  . Acetaminophen (TYLENOL PO) Take by mouth 3 (three) times daily. 2 tabs as needed for aches and pains    . cholestyramine (QUESTRAN) 4 g packet Take 1 packet (4 g total) by mouth 2 (two) times daily. 60 each 0  . dexamethasone (DECADRON) 4 MG tablet Take 2 tablets (8 mg total) by mouth 2 (two) times daily. Start the day before Taxotere. Take once the day after, then 2 times a day x 2d. 30 tablet 1  . fluconazole (DIFLUCAN) 100 MG tablet Take 1 tablet (100 mg total) by mouth daily. 20 tablet 1  . ibuprofen (ADVIL,MOTRIN) 800 MG tablet Take 1 tablet (800 mg total) by mouth every 8 (eight) hours as needed. 30 tablet 0  . lidocaine-prilocaine (EMLA) cream Apply to affected area once 30 g 3  . loratadine (CLARITIN) 10 MG tablet Take 10 mg by mouth daily.    Marland Kitchen LORazepam (ATIVAN) 0.5 MG tablet TAKE 1 TABLET (0.5 MG TOTAL) BY MOUTH AT BEDTIME AS NEEDED (NAUSEA OR VOMITING). 30 tablet 0  . magic mouthwash w/lidocaine SOLN Take 5 mLs by mouth 4 (four) times daily as needed for mouth pain. Swish, Swallow, or spit. 240 mL  1  . NAPROXEN PO Take by mouth as needed. 1 tab as needed for aches and pain    . nystatin (MYCOSTATIN) 100000 UNIT/ML suspension Take 5 mLs (500,000 Units total) by mouth 4 (four) times daily. 60 mL 0  . ondansetron (ZOFRAN) 8 MG tablet Take 1 tablet (8 mg total) by mouth every 8 (eight) hours as needed for nausea or vomiting. 20 tablet 0  . oxyCODONE (OXY IR/ROXICODONE) 5 MG immediate release tablet Take 1 tablet (5 mg total) by mouth every 6 (six) hours as needed for severe pain. 15 tablet 0  . Probiotic Product (PROBIOTIC PO) Take by mouth daily.    . prochlorperazine (COMPAZINE) 10 MG tablet Take 1 tablet (10 mg total) by mouth every 6 (six) hours as needed (Nausea or vomiting). 30 tablet 1   No current facility-administered  medications for this visit.     OBJECTIVE:  Vitals:   05/13/18 1027  BP: 122/76  Pulse: 63  Resp: 18  Temp: 98.7 F (37.1 C)  SpO2: 100%     Body mass index is 24.95 kg/m.   Wt Readings from Last 3 Encounters:  05/13/18 161 lb 11.2 oz (73.3 kg)  04/29/18 156 lb 3.2 oz (70.9 kg)  04/22/18 159 lb 14.4 oz (72.5 kg)  ECOG FS:1 GENERAL: Patient is a well appearing female in no acute distress HEENT:  Sclerae anicteric.  Oropharynx clear and moist. No ulcerations or evidence of oropharyngeal candidiasis. Neck is supple.  NODES:  No cervical, supraclavicular, or axillary lymphadenopathy palpated.  BREAST EXAM:  Right breast s/p lumpectomy, well healed, no sign of local recurrence, left breast benign LUNGS:  Clear to auscultation bilaterally.  No wheezes or rhonchi. HEART:  Regular rate and rhythm. No murmur appreciated. ABDOMEN:  Soft, nontender.  Positive, normoactive bowel sounds. No organomegaly palpated. MSK:  No focal spinal tenderness to palpation. Full range of motion bilaterally in the upper extremities. EXTREMITIES:  No peripheral edema.   SKIN:  Clear with no obvious rashes or skin changes. No nail dyscrasia. NEURO:  Nonfocal. Well oriented.  Appropriate affect.      LAB RESULTS:  CMP     Component Value Date/Time   NA 143 05/13/2018 0943   K 3.4 (L) 05/13/2018 0943   CL 108 05/13/2018 0943   CO2 24 05/13/2018 0943   GLUCOSE 102 (H) 05/13/2018 0943   BUN 11 05/13/2018 0943   CREATININE 0.66 05/13/2018 0943   CALCIUM 9.3 05/13/2018 0943   PROT 6.6 05/13/2018 0943   ALBUMIN 3.8 05/13/2018 0943   AST 33 05/13/2018 0943   ALT 78 (H) 05/13/2018 0943   ALKPHOS 73 05/13/2018 0943   BILITOT 0.5 05/13/2018 0943   GFRNONAA >60 05/13/2018 0943   GFRAA >60 05/13/2018 0943    No results found for: TOTALPROTELP, ALBUMINELP, A1GS, A2GS, BETS, BETA2SER, GAMS, MSPIKE, SPEI  No results found for: KPAFRELGTCHN, LAMBDASER, KAPLAMBRATIO  Lab Results  Component Value Date     WBC 8.8 05/13/2018   NEUTROABS 6.2 05/13/2018   HGB 10.2 (L) 05/13/2018   HCT 31.6 (L) 05/13/2018   MCV 95.8 05/13/2018   PLT 183 05/13/2018    '@LASTCHEMISTRY' @  No results found for: LABCA2  No components found for: MGNOIB704  No results for input(s): INR in the last 168 hours.  No results found for: LABCA2  No results found for: UGQ916  No results found for: XIH038  No results found for: UEK800  No results found for: CA2729  No components found for:  HGQUANT  No results found for: CEA1 / No results found for: CEA1   No results found for: AFPTUMOR  No results found for: [place]  No results found for: PSA1  Appointment on 05/13/2018  Component Date Value Ref Range Status  . Sodium 05/13/2018 143  135 - 145 mmol/L Final  . Potassium 05/13/2018 3.4* 3.5 - 5.1 mmol/L Final  . Chloride 05/13/2018 108  98 - 111 mmol/L Final  . CO2 05/13/2018 24  22 - 32 mmol/L Final  . Glucose, Bld 05/13/2018 102* 70 - 99 mg/dL Final  . BUN 05/13/2018 11  6 - 20 mg/dL Final  . Creatinine 05/13/2018 0.66  0.44 - 1.00 mg/dL Final  . Calcium 05/13/2018 9.3  8.9 - 10.3 mg/dL Final  . Total Protein 05/13/2018 6.6  6.5 - 8.1 g/dL Final  . Albumin 05/13/2018 3.8  3.5 - 5.0 g/dL Final  . AST 05/13/2018 33  15 - 41 U/L Final  . ALT 05/13/2018 78* 0 - 44 U/L Final  . Alkaline Phosphatase 05/13/2018 73  38 - 126 U/L Final  . Total Bilirubin 05/13/2018 0.5  0.3 - 1.2 mg/dL Final  . GFR, Est Non Af Am 05/13/2018 >60  >60 mL/min Final  . GFR, Est AFR Am 05/13/2018 >60  >60 mL/min Final   Comment: (NOTE) The eGFR has been calculated using the CKD EPI equation. This calculation has not been validated in all clinical situations. eGFR's persistently <60 mL/min signify possible Chronic Kidney Disease.   Georgiann Hahn gap 05/13/2018 11  5 - 15 Final   Performed at [facility], [place] [address] [place], [place] [address]  . WBC Count 05/13/2018 8.8  4.0 - 10.5 K/uL Final  .  RBC 05/13/2018 3.30* 3.87 - 5.11 MIL/uL Final  . Hemoglobin 05/13/2018 10.2* 12.0 - 15.0 g/dL Final  . HCT 05/13/2018 31.6* 36.0 - 46.0 % Final  . MCV 05/13/2018 95.8  80.0 - 100.0 fL Final  . MCH 05/13/2018 30.9  26.0 - 34.0 pg Final  . MCHC 05/13/2018 32.3  30.0 - 36.0 g/dL Final  . RDW 05/13/2018 16.2* 11.5 - 15.5 % Final  . Platelet Count 05/13/2018 183  150 - 400 K/uL Final  . nRBC 05/13/2018 0.0  0.0 - 0.2 % Final  . Neutrophils Relative % 05/13/2018 70  % Final  . Neutro Abs 05/13/2018 6.2  1.7 - 7.7 K/uL Final  . Lymphocytes Relative 05/13/2018 20  % Final  . Lymphs Abs 05/13/2018 1.7  0.7 - 4.0 K/uL Final  . Monocytes Relative 05/13/2018 9  % Final  . Monocytes Absolute 05/13/2018 0.8  0.1 - 1.0 K/uL Final  . Eosinophils Relative 05/13/2018 0  % Final  . Eosinophils Absolute 05/13/2018 0.0  0.0 - 0.5 K/uL Final  . Basophils Relative 05/13/2018 0  % Final  . Basophils Absolute 05/13/2018 0.0  0.0 - 0.1 K/uL Final  . Immature Granulocytes 05/13/2018 1  % Final  . Abs Immature Granulocytes 05/13/2018 0.06  0.00 - 0.07 K/uL Final   Performed at [facility], [place] [address], [place], [place] [address]    (this displays the last labs from the last 3 days)  No results found for: TOTALPROTELP, ALBUMINELP, A1GS, A2GS, BETS, BETA2SER, GAMS, MSPIKE, SPEI (this displays SPEP labs)  No results found for: KPAFRELGTCHN, LAMBDASER, KAPLAMBRATIO (kappa/lambda light chains)  No results found for: HGBA, HGBA2QUANT, HGBFQUANT, HGBSQUAN (Hemoglobinopathy evaluation)   No results found for: LDH  No results  found for: IRON, TIBC, IRONPCTSAT (Iron and TIBC)  No results found for: FERRITIN  Urinalysis No results found for: COLORURINE, APPEARANCEUR, LABSPEC, PHURINE, GLUCOSEU, HGBUR, BILIRUBINUR, KETONESUR, PROTEINUR, UROBILINOGEN, NITRITE, LEUKOCYTESUR   STUDIES: No results found.  ELIGIBLE FOR AVAILABLE RESEARCH PROTOCOL: BCEP  ASSESSMENT: 47 y.o.   [place], [place] woman status post right breast upper inner quadrant biopsy 12/29/2017, for a clinical T1c N0, stage IA invasive ductal carcinoma, triple positive, with an MIB-1 of 80%.  (1) genetics testing 02/02/2018 though the CancerNext gene panel offered by [facility] showed no deleterious mutations in  APC, ATM, BARD1, BMPR1A,BRCA1, BRCA2, BRIP1, CDH1, CDK4, CDKN2A, CHEK2, DICER1, HOXB13, MLH1, MRE11A, MSH2, MSH6, MUTYH, NBN, NF1, PALB2, PMS2, POLD1, POLE, PTEN, RAD50, RAD51C, RAD51D, SMAD4, SMARCA4, STK11 and TP53 (sequencing and deletion/duplication); EPCAM and GREM1 (deletion/duplication only).  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>A) )   (2) right lumpectomy and sentinel lymph node sampling 02/16/2018 showed a pT2 pN0, stage IB invasive ductal carcinoma, grade 3, with a positive inferior margin; a total of 5 lymph nodes were removed  (a) additional surgery 02/27/2018 clear the margins  (3) started carboplatin, docetaxel, trastuzumab and Pertuzumab 03/11/2018, to be repeated every 21 days x 6.    (a) Pertuzumab omitted after cycle 1 due to diarrhea.  (4) continue trastuzumab to total 6 months  (a) echocardiogram 01/21/2018 showed an ejection fraction in the 55-60% range  (b) repeat echocardiogram 06/14/2018 (pending).  (5) adjuvant radiation to follow  (6) antiestrogens to start at the completion of local treatment  PLAN:  Yarely is doing well today.  Her labs are stable.  Her potassium is mildly decreased and we reviewed potassium rich diet.  She is tolerating treatment well and will proceed with her fourth of 6 cycles of Docetaxel, Carboplatin and Trastuzumab.  She knows to let us know if her leg skin lesions return.  They are improved today.   Anai and I reviewed her bowel regimen in detail.  She has her bowels under control with the Questran and imodium.    Rhona needs refills on nystatin and Diflucan.   I [facility] those into [facility] on [address]  for her.   Dailin will return in one week for labs and f/u.  She knows to call for any other problems that may develop before the next visit.  A total of (30) minutes of face-to-face time was spent with this patient with greater than 50% of that time in counseling and care-coordination.    Wilber Bihari, NP 05/13/18 10:32 AM Medical Oncology and Hematology [facility] [address] [place], [place] [address] Tel. [phone]    Fax. [phone]

## 2018-05-13 NOTE — Patient Instructions (Signed)
[facility] Discharge Instructions for Patients Receiving Chemotherapy  Today you received the following chemotherapy agents Trastuzumab (Herceptin), Docetaxel (Taxotere) & Carboplatin (Paraplatin).   To help prevent nausea and vomiting after your treatment, we encourage you to take your nausea medication as prescribed.   If you develop nausea and vomiting that is not controlled by your nausea medication, call the clinic.   BELOW ARE SYMPTOMS THAT SHOULD BE REPORTED IMMEDIATELY:  *FEVER GREATER THAN 100.5 F  *CHILLS WITH OR WITHOUT FEVER  NAUSEA AND VOMITING THAT IS NOT CONTROLLED WITH YOUR NAUSEA MEDICATION  *UNUSUAL SHORTNESS OF BREATH  *UNUSUAL BRUISING OR BLEEDING  TENDERNESS IN MOUTH AND THROAT WITH OR WITHOUT PRESENCE OF ULCERS  *URINARY PROBLEMS  *BOWEL PROBLEMS  UNUSUAL RASH Items with * indicate a potential emergency and should be followed up as soon as possible.  Feel free to call the clinic should you have any questions or concerns. The clinic phone number is [phone].  Please show the [facility] at check-in to the Emergency Department and triage nurse.

## 2018-05-16 ENCOUNTER — Other Ambulatory Visit: Payer: Self-pay | Admitting: Medical Oncology

## 2018-05-16 ENCOUNTER — Other Ambulatory Visit: Payer: Self-pay | Admitting: *Deleted

## 2018-05-16 ENCOUNTER — Other Ambulatory Visit: Payer: Self-pay | Admitting: Oncology

## 2018-05-16 ENCOUNTER — Telehealth: Payer: Self-pay | Admitting: Medical Oncology

## 2018-05-16 ENCOUNTER — Inpatient Hospital Stay: Payer: Managed Care, Other (non HMO)

## 2018-05-16 VITALS — BP 136/84 | HR 67 | Temp 98.3°F | Resp 16

## 2018-05-16 DIAGNOSIS — Z5111 Encounter for antineoplastic chemotherapy: Secondary | ICD-10-CM | POA: Diagnosis not present

## 2018-05-16 DIAGNOSIS — Z170 Estrogen receptor positive status [ER+]: Secondary | ICD-10-CM

## 2018-05-16 DIAGNOSIS — C50211 Malignant neoplasm of upper-inner quadrant of right female breast: Secondary | ICD-10-CM

## 2018-05-16 MED ORDER — PEGFILGRASTIM-CBQV 6 MG/0.6ML ~~LOC~~ SOSY: 6.0000 mg | PREFILLED_SYRINGE | Freq: Once | SUBCUTANEOUS | Status: DC

## 2018-05-16 MED ORDER — PEGFILGRASTIM INJECTION 6 MG/0.6ML ~~LOC~~
6.0000 mg | PREFILLED_SYRINGE | Freq: Once | SUBCUTANEOUS | Status: AC
  Administered 2018-05-16: 6 mg via SUBCUTANEOUS

## 2018-05-16 MED ORDER — PEGFILGRASTIM INJECTION 6 MG/0.6ML ~~LOC~~
PREFILLED_SYRINGE | SUBCUTANEOUS | Status: AC
  Filled 2018-05-16: qty 0.6

## 2018-05-16 MED ORDER — PEGFILGRASTIM INJECTION 6 MG/0.6ML ~~LOC~~: 6.0000 mg | PREFILLED_SYRINGE | Freq: Once | SUBCUTANEOUS | Status: DC

## 2018-05-16 NOTE — Telephone Encounter (Signed)
Per Phamacy she will not have to pay for device. INusrance will cove r

## 2018-05-16 NOTE — Telephone Encounter (Signed)
Pt needs [place] approval today and it needs to be given today. Message to [place]. Per pharmacy Neulasta approved -Per Magrinat ok to change to neulasta

## 2018-05-16 NOTE — Telephone Encounter (Signed)
ONPRO fell off-Pt states onpro device was placed on 11/01 on her left arm about 4 pm. Two hours later she bumped her arm and arm started to bleed and device was halfway off. She took off device and kept it for two days then trashed it and cannot recover it. Schedule request sent today for Neulasta appt. If her insurance won't cover it she cannot afford it.

## 2018-05-16 NOTE — Patient Instructions (Signed)
Pegfilgrastim injection What is this medicine? PEGFILGRASTIM (PEG fil gra stim) is a long-acting granulocyte colony-stimulating factor that stimulates the growth of neutrophils, a type of white blood cell important in the body's fight against infection. It is used to reduce the incidence of fever and infection in patients with certain types of cancer who are receiving chemotherapy that affects the bone marrow, and to increase survival after being exposed to high doses of radiation. This medicine may be used for other purposes; ask your health care provider or pharmacist if you have questions. COMMON BRAND NAME(S): Neulasta What should I tell my health care provider before I take this medicine? They need to know if you have any of these conditions: -kidney disease -latex allergy -ongoing radiation therapy -sickle cell disease -skin reactions to acrylic adhesives (On-Body Injector only) -an unusual or allergic reaction to pegfilgrastim, filgrastim, other medicines, foods, dyes, or preservatives -pregnant or trying to get pregnant -breast-feeding How should I use this medicine? This medicine is for injection under the skin. If you get this medicine at home, you will be taught how to prepare and give the pre-filled syringe or how to use the On-body Injector. Refer to the patient Instructions for Use for detailed instructions. Use exactly as directed. Tell your healthcare provider immediately if you suspect that the On-body Injector may not have performed as intended or if you suspect the use of the On-body Injector resulted in a missed or partial dose. It is important that you put your used needles and syringes in a special sharps container. Do not put them in a trash can. If you do not have a sharps container, call your pharmacist or healthcare provider to get one. Talk to your pediatrician regarding the use of this medicine in children. While this drug may be prescribed for selected conditions,  precautions do apply. Overdosage: If you think you have taken too much of this medicine contact a poison control center or emergency room at once. NOTE: This medicine is only for you. Do not share this medicine with others. What if I miss a dose? It is important not to miss your dose. Call your doctor or health care professional if you miss your dose. If you miss a dose due to an On-body Injector failure or leakage, a new dose should be administered as soon as possible using a single prefilled syringe for manual use. What may interact with this medicine? Interactions have not been studied. Give your health care provider a list of all the medicines, herbs, non-prescription drugs, or dietary supplements you use. Also tell them if you smoke, drink alcohol, or use illegal drugs. Some items may interact with your medicine. This list may not describe all possible interactions. Give your health care provider a list of all the medicines, herbs, non-prescription drugs, or dietary supplements you use. Also tell them if you smoke, drink alcohol, or use illegal drugs. Some items may interact with your medicine. What should I watch for while using this medicine? You may need blood work done while you are taking this medicine. If you are going to need a MRI, CT scan, or other procedure, tell your doctor that you are using this medicine (On-Body Injector only). What side effects may I notice from receiving this medicine? Side effects that you should report to your doctor or health care professional as soon as possible: -allergic reactions like skin rash, itching or hives, swelling of the face, lips, or tongue -dizziness -fever -pain, redness, or irritation at site   where injected -pinpoint red spots on the skin -red or dark-brown urine -shortness of breath or breathing problems -stomach or side pain, or pain at the shoulder -swelling -tiredness -trouble passing urine or change in the amount of urine Side  effects that usually do not require medical attention (report to your doctor or health care professional if they continue or are bothersome): -bone pain -muscle pain This list may not describe all possible side effects. Call your doctor for medical advice about side effects. You may report side effects to FDA at 1-800-FDA-1088. Where should I keep my medicine? Keep out of the reach of children. Store pre-filled syringes in a refrigerator between 2 and 8 degrees C (36 and 46 degrees F). Do not freeze. Keep in carton to protect from light. Throw away this medicine if it is left out of the refrigerator for more than 48 hours. Throw away any unused medicine after the expiration date. NOTE: This sheet is a summary. It may not cover all possible information. If you have questions about this medicine, talk to your doctor, pharmacist, or health care provider.  2018 Elsevier/Gold Standard (2016-06-25 12:58:03)  

## 2018-05-16 NOTE — Progress Notes (Signed)
Monica [name] got OnPro after her Friday visit but then she accidentally hit a wall and it came off.

## 2018-05-16 NOTE — Addendum Note (Signed)
Addended by: Margaret Pyle on: 05/16/2018 11:19 AM   Modules accepted: Orders

## 2018-05-17 ENCOUNTER — Other Ambulatory Visit: Payer: Self-pay | Admitting: *Deleted

## 2018-05-17 DIAGNOSIS — C50211 Malignant neoplasm of upper-inner quadrant of right female breast: Secondary | ICD-10-CM

## 2018-05-17 DIAGNOSIS — Z170 Estrogen receptor positive status [ER+]: Secondary | ICD-10-CM

## 2018-05-17 MED ORDER — CHOLESTYRAMINE 4 G PO PACK: 4.0000 g | PACK | Freq: Two times a day (BID) | ORAL | 0 refills | Status: DC

## 2018-05-20 ENCOUNTER — Telehealth: Payer: Self-pay

## 2018-05-20 ENCOUNTER — Inpatient Hospital Stay: Payer: Managed Care, Other (non HMO)

## 2018-05-20 ENCOUNTER — Encounter: Payer: Self-pay | Admitting: Adult Health

## 2018-05-20 ENCOUNTER — Inpatient Hospital Stay (HOSPITAL_BASED_OUTPATIENT_CLINIC_OR_DEPARTMENT_OTHER): Payer: Managed Care, Other (non HMO) | Admitting: Adult Health

## 2018-05-20 ENCOUNTER — Other Ambulatory Visit: Payer: Self-pay | Admitting: Oncology

## 2018-05-20 VITALS — BP 119/72 | HR 73 | Temp 98.4°F | Resp 18 | Ht 67.5 in | Wt 156.6 lb

## 2018-05-20 DIAGNOSIS — C50211 Malignant neoplasm of upper-inner quadrant of right female breast: Secondary | ICD-10-CM | POA: Diagnosis not present

## 2018-05-20 DIAGNOSIS — Z170 Estrogen receptor positive status [ER+]: Secondary | ICD-10-CM

## 2018-05-20 DIAGNOSIS — Z803 Family history of malignant neoplasm of breast: Secondary | ICD-10-CM | POA: Diagnosis not present

## 2018-05-20 DIAGNOSIS — Z801 Family history of malignant neoplasm of trachea, bronchus and lung: Secondary | ICD-10-CM | POA: Diagnosis not present

## 2018-05-20 DIAGNOSIS — Z800 Family history of malignant neoplasm of digestive organs: Secondary | ICD-10-CM

## 2018-05-20 DIAGNOSIS — Z5111 Encounter for antineoplastic chemotherapy: Secondary | ICD-10-CM | POA: Diagnosis not present

## 2018-05-20 DIAGNOSIS — Z95828 Presence of other vascular implants and grafts: Secondary | ICD-10-CM

## 2018-05-20 LAB — CBC WITH DIFFERENTIAL (CANCER CENTER ONLY)
Band Neutrophils: 20 %
Basophils Absolute: 0 10*3/uL (ref 0.0–0.1)
Basophils Relative: 0 %
Eosinophils Absolute: 0 10*3/uL (ref 0.0–0.5)
Eosinophils Relative: 0 %
HCT: 29.3 % — ABNORMAL LOW (ref 36.0–46.0)
Hemoglobin: 9.4 g/dL — ABNORMAL LOW (ref 12.0–15.0)
Lymphocytes Relative: 30 %
Lymphs Abs: 1.6 10*3/uL (ref 0.7–4.0)
MCH: 31.2 pg (ref 26.0–34.0)
MCHC: 32.1 g/dL (ref 30.0–36.0)
MCV: 97.3 fL (ref 80.0–100.0)
Metamyelocytes Relative: 3 %
Monocytes Absolute: 0.7 10*3/uL (ref 0.1–1.0)
Monocytes Relative: 14 %
Myelocytes: 1 %
Neutro Abs: 2.9 10*3/uL (ref 1.7–17.7)
Neutrophils Relative %: 32 %
Platelet Count: 163 10*3/uL (ref 150–400)
RBC: 3.01 MIL/uL — ABNORMAL LOW (ref 3.87–5.11)
RDW: 15.8 % — ABNORMAL HIGH (ref 11.5–15.5)
WBC Count: 5.2 10*3/uL (ref 4.0–10.5)
nRBC: 0 % (ref 0.0–0.2)

## 2018-05-20 LAB — CMP (CANCER CENTER ONLY)
ALT: 63 U/L — ABNORMAL HIGH (ref 0–44)
AST: 22 U/L (ref 15–41)
Albumin: 4 g/dL (ref 3.5–5.0)
Alkaline Phosphatase: 69 U/L (ref 38–126)
Anion gap: 9 (ref 5–15)
BUN: 10 mg/dL (ref 6–20)
CO2: 27 mmol/L (ref 22–32)
Calcium: 9.4 mg/dL (ref 8.9–10.3)
Chloride: 104 mmol/L (ref 98–111)
Creatinine: 0.66 mg/dL (ref 0.44–1.00)
GFR, Est AFR Am: >60 mL/min (ref >60)
GFR, Estimated: >60 mL/min (ref >60)
Glucose, Bld: 89 mg/dL (ref 70–99)
Potassium: 3.8 mmol/L (ref 3.5–5.1)
Sodium: 140 mmol/L (ref 135–145)
Total Bilirubin: 0.5 mg/dL (ref 0.3–1.2)
Total Protein: 6.6 g/dL (ref 6.5–8.1)

## 2018-05-20 MED ORDER — SODIUM CHLORIDE 0.9% FLUSH
10.0000 mL | Freq: Once | INTRAVENOUS | Status: AC
  Administered 2018-05-20: 10 mL
  Filled 2018-05-20: qty 10

## 2018-05-20 MED ORDER — HEPARIN SOD (PORK) LOCK FLUSH 100 UNIT/ML IV SOLN
500.0000 [IU] | Freq: Once | INTRAVENOUS | Status: AC
  Administered 2018-05-20: 500 [IU]
  Filled 2018-05-20: qty 5

## 2018-05-20 NOTE — Telephone Encounter (Signed)
Called to Dr. Claris Gladden office to verify if patient had appt for echo and appt with him.  Receptionist verified appt with MD on 06/14/18 but stated that they would not do an echo that day.  However, it is noted in chart that pt does have an appt for echo at 8 am that morning.

## 2018-05-20 NOTE — Progress Notes (Signed)
Monica [name]  [phone] Fax:([phone]) [phone]     ID: Monica [name] DOB: [birth year]  MR#: [id number]  OXB#:[id number]  Patient Care Team: [name], PA-C as PCP - General (Family Medicine) [name], MD as Consulting Physician (General Surgery) Magrinat, [name], MD as Consulting Physician (Oncology) [name], MD as Consulting Physician (Radiation Oncology) [name], MD as Consulting Physician (Obstetrics and Gynecology) OTHER MD: [facility]  CHIEF COMPLAINT: triple positive breast cancer  CURRENT TREATMENT: adjuvant chemo-immunotherapy  HISTORY OF CURRENT ILLNESS: From the original intake note:  Monica [name] (pronounced "uric") palpated a right supraclavicular mass and felt tenderness after stretching one day. She brought this to her PA's attention.  The patient underwent bilateral diagnostic mammography with tomography and right breast ultrasonography at [facility] on 12/20/2017 showing: breast density category C. There was a suspicious palpable mass at the 1 o'clock position upper inner quadrant measuring 1.4 x 1.3 x 1.3 cm located 12 cm from the nipple. There was an additional mass at the 8 o'clock radiant measuring 1.2 x 1.0 x 1.4 cm, which likely represented a cyst. Ultrasound evaluation of the right axilla demonstrates no evidence of lymphadenopathy  Accordingly on 12/29/2017 she proceeded to biopsy of the right breast area in question. The pathology from this procedure showed ([id number]): Invasive ductal carcinoma, grade III. Prognostic indicators significant for: estrogen receptor, 80% positive and progesterone receptor, 70% positive, both with strong staining intensity. Proliferation marker Ki67 at 80%. HER2 amplified with ratios HER2/CEP17 signals 3.44 and average HER2 copies per cell 10.65.  The patient's subsequent history is as detailed below.  INTERVAL HISTORY: Monica [name] returns today for follow up and  treatment of her triple positive breast cancer. She receives carboplatin, docetaxel, trastuzumab, and pertuzumab given every 21 days. (Pertuzumab discontinued due to diarrhea).  Today is day 8 cycle 4 of 6 planned cycles.   REVIEW OF SYSTEMS: Monica [name] is doing well today.  Her skin is doing well and is healing.  She knocked her onpro off about 2 hours after it was put on.  She came in for Neulasta instead.  She had some mild diarrhea last night, but feels fine now.  She notes that she tolerated treatment better with the Neulasta instead of the Onpro.  She says her normal side effects are much improved.  She denies any peripheral neuropathy.  Otherwise, a detailed ROS was conducted and was non contributory.    PAST MEDICAL HISTORY: Past Medical History:  Diagnosis Date  . Arthritis    lower back  . Cancer [facility])    right breast cancer  . Family history of breast cancer   She notes that she had a heart murmur as a child, which she grew out of.   PAST SURGICAL HISTORY: Past Surgical History:  Procedure Laterality Date  . ABDOMINAL HYSTERECTOMY    . BREAST LUMPECTOMY WITH RADIOACTIVE SEED AND SENTINEL LYMPH NODE BIOPSY Right 02/16/2018   Procedure: RIGHT BREAST LUMPECTOMY WITH RADIOACTIVE SEED AND SENTINEL LYMPH NODE BIOPSY;  Surgeon: [name], MD;  Location: [facility];  Service: General;  Laterality: Right;  . HYMENECTOMY    . PORTACATH PLACEMENT Right 02/16/2018   Procedure: INSERTION PORT-A-CATH;  Surgeon: [name], MD;  Location: [facility];  Service: General;  Laterality: Right;  . RE-EXCISION OF BREAST LUMPECTOMY Right 02/22/2018   Procedure: RE-EXCISION OF RIGHT  BREAST LUMPECTOMY;  Surgeon: [name], MD;  Location: [facility];  Service: General;  Laterality:  Right;  Marland Kitchen REPAIR VAGINAL CUFF N/A 02/07/2017   Procedure: REPAIR VAGINAL CUFF;  Surgeon: Ena Dawley, MD;  Location: [facility];  Service: Gynecology;  Laterality: N/A;  .  ROBOTIC ASSISTED TOTAL HYSTERECTOMY WITH SALPINGECTOMY Left 01/20/2017   Procedure: ROBOTIC ASSISTED TOTAL HYSTERECTOMY WITH SALPINGECTOMY;  Surgeon: [name], MD;  Location: [facility];  Service: Gynecology;  Laterality: Left;  Partial Hysterectomy without BSO  FAMILY HISTORY Family History  Problem Relation Age of Onset  . Lung cancer Maternal Grandfather   . Esophageal cancer Paternal Grandfather 27  . Breast cancer Cousin 76  As of July 2019, the patient's father is alive at 57. The patient's mother is also alive at 68. The patient has 1 brother and 5 sisters. There was a maternal grandfather diagnosed with lung cancer at 45. There was a paternal grandfather diagnosed with esophageal cancer at 46. The patient's father had a pre-cancerous esophageal finding at age 23. There was also a maternal 1st cousin diagnosed with metastatic breast cancer at age 59.    GYNECOLOGIC HISTORY:  Patient's last menstrual period was 12/21/2016 (exact date). Menarche: 47 years old Age at first live birth: 47 years old She is GXP2. She is status post partial hysterectomy without BSO in 2018 She never used HRT. She used oral contraception over 21 years ago with no complications.   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 The patient is separated from her husband, Monica [name]. The patient's son Monica [name] age 66 is in the [employer] stationed in [place] in the Horticulturist, commercial. He plans on working in the railroad industry after he returns. The patient's daughter Monica [name] age 39, works at [employer].      ADVANCED DIRECTIVES: Not in place   HEALTH MAINTENANCE: Social History   Tobacco Use  . Smoking status: Never Smoker  . Smokeless tobacco: Never Used  Substance Use Topics  . Alcohol use: Yes    Comment: occ  . Drug use: No     Colonoscopy:   PAP: 2018/ prior to hysterectomy  Bone density:   No Known Allergies  Current Outpatient Medications    Medication Sig Dispense Refill  . Acetaminophen (TYLENOL PO) Take by mouth 3 (three) times daily. 2 tabs as needed for aches and pains    . cholestyramine (QUESTRAN) 4 g packet Take 1 packet (4 g total) by mouth 2 (two) times daily. 60 each 0  . dexamethasone (DECADRON) 4 MG tablet Take 2 tablets (8 mg total) by mouth 2 (two) times daily. Start the day before Taxotere. Take once the day after, then 2 times a day x 2d. 30 tablet 1  . fluconazole (DIFLUCAN) 100 MG tablet Take 1 tablet (100 mg total) by mouth daily. 20 tablet 1  . ibuprofen (ADVIL,MOTRIN) 800 MG tablet Take 1 tablet (800 mg total) by mouth every 8 (eight) hours as needed. 30 tablet 0  . lidocaine-prilocaine (EMLA) cream Apply to affected area once 30 g 3  . loratadine (CLARITIN) 10 MG tablet Take 10 mg by mouth daily.    Marland Kitchen LORazepam (ATIVAN) 0.5 MG tablet TAKE 1 TABLET (0.5 MG TOTAL) BY MOUTH AT BEDTIME AS NEEDED (NAUSEA OR VOMITING). 30 tablet 0  . magic mouthwash w/lidocaine SOLN Take 5 mLs by mouth 4 (four) times daily as needed for mouth pain. Swish, Swallow, or spit. 240 mL 1  . NAPROXEN PO Take by mouth as needed. 1 tab as needed for aches and pain    .  nystatin (MYCOSTATIN) 100000 UNIT/ML suspension Take 5 mLs (500,000 Units total) by mouth 4 (four) times daily. 60 mL 0  . ondansetron (ZOFRAN) 8 MG tablet Take 1 tablet (8 mg total) by mouth every 8 (eight) hours as needed for nausea or vomiting. 20 tablet 0  . oxyCODONE (OXY IR/ROXICODONE) 5 MG immediate release tablet Take 1 tablet (5 mg total) by mouth every 6 (six) hours as needed for severe pain. 15 tablet 0  . Probiotic Product (PROBIOTIC PO) Take by mouth daily.    . prochlorperazine (COMPAZINE) 10 MG tablet Take 1 tablet (10 mg total) by mouth every 6 (six) hours as needed (Nausea or vomiting). 30 tablet 1   No current facility-administered medications for this visit.     OBJECTIVE:  Vitals:   05/20/18 1324  BP: 119/72  Pulse: 73  Resp: 18  Temp: 98.4 F (36.9  C)  SpO2: 100%     Body mass index is 24.17 kg/m.   Wt Readings from Last 3 Encounters:  05/20/18 156 lb 9.6 oz (71 kg)  05/13/18 161 lb 11.2 oz (73.3 kg)  04/29/18 156 lb 3.2 oz (70.9 kg)  ECOG FS:1 GENERAL: Patient is a well appearing female in no acute distress HEENT:  Sclerae anicteric.  Oropharynx clear and moist. No ulcerations or evidence of oropharyngeal candidiasis. Neck is supple.  NODES:  No cervical, supraclavicular, or axillary lymphadenopathy palpated.  BREAST EXAM: deferred today LUNGS:  Clear to auscultation bilaterally.  No wheezes or rhonchi. HEART:  Regular rate and rhythm. No murmur appreciated. ABDOMEN:  Soft, nontender.  Positive, normoactive bowel sounds. No organomegaly palpated. MSK:  No focal spinal tenderness to palpation. Full range of motion bilaterally in the upper extremities. EXTREMITIES:  No peripheral edema.   SKIN:  Clear with no obvious rashes or skin changes. No nail dyscrasia. NEURO:  Nonfocal. Well oriented.  Appropriate affect.      LAB RESULTS:  CMP     Component Value Date/Time   NA 143 05/13/2018 0943   K 3.4 (L) 05/13/2018 0943   CL 108 05/13/2018 0943   CO2 24 05/13/2018 0943   GLUCOSE 102 (H) 05/13/2018 0943   BUN 11 05/13/2018 0943   CREATININE 0.66 05/13/2018 0943   CALCIUM 9.3 05/13/2018 0943   PROT 6.6 05/13/2018 0943   ALBUMIN 3.8 05/13/2018 0943   AST 33 05/13/2018 0943   ALT 78 (H) 05/13/2018 0943   ALKPHOS 73 05/13/2018 0943   BILITOT 0.5 05/13/2018 0943   GFRNONAA >60 05/13/2018 0943   GFRAA >60 05/13/2018 0943    No results found for: TOTALPROTELP, ALBUMINELP, A1GS, A2GS, BETS, BETA2SER, GAMS, MSPIKE, SPEI  No results found for: KPAFRELGTCHN, LAMBDASER, KAPLAMBRATIO  Lab Results  Component Value Date   WBC 5.2 05/20/2018   NEUTROABS PENDING 05/20/2018   HGB 9.4 (L) 05/20/2018   HCT 29.3 (L) 05/20/2018   MCV 97.3 05/20/2018   PLT 163 05/20/2018    _0 @  No results found for: LABCA2  No  components found for: VOHYWV371  No results for input(s): INR in the last 168 hours.  No results found for: LABCA2  No results found for: GGY694  No results found for: WNI627  No results found for: OJJ009  No results found for: CA2729  No components found for: HGQUANT  No results found for: CEA1 / No results found for: CEA1   No results found for: AFPTUMOR  No results found for: [place]  No results found for: PSA1  Appointment on 05/20/2018  Component Date Value Ref Range Status  . WBC Count 05/20/2018 5.2  4.0 - 10.5 K/uL Final  . RBC 05/20/2018 3.01* 3.87 - 5.11 MIL/uL Final  . Hemoglobin 05/20/2018 9.4* 12.0 - 15.0 g/dL Final  . HCT 05/20/2018 29.3* 36.0 - 46.0 % Final  . MCV 05/20/2018 97.3  80.0 - 100.0 fL Final  . MCH 05/20/2018 31.2  26.0 - 34.0 pg Final  . MCHC 05/20/2018 32.1  30.0 - 36.0 g/dL Final  . RDW 05/20/2018 15.8* 11.5 - 15.5 % Final  . Platelet Count 05/20/2018 163  150 - 400 K/uL Final  . nRBC 05/20/2018 0.0  0.0 - 0.2 % Final   Performed at [facility], [place] [address], [place], [place] [address]  . Neutrophils Relative % 05/20/2018 PENDING  % Incomplete  . Neutro Abs 05/20/2018 PENDING  1.7 - 7.7 K/uL Incomplete  . Band Neutrophils 05/20/2018 PENDING  % Incomplete  . Lymphocytes Relative 05/20/2018 PENDING  % Incomplete  . Lymphs Abs 05/20/2018 PENDING  0.7 - 4.0 K/uL Incomplete  . Monocytes Relative 05/20/2018 PENDING  % Incomplete  . Monocytes Absolute 05/20/2018 PENDING  0.1 - 1.0 K/uL Incomplete  . Eosinophils Relative 05/20/2018 PENDING  % Incomplete  . Eosinophils Absolute 05/20/2018 PENDING  0.0 - 0.5 K/uL Incomplete  . Basophils Relative 05/20/2018 PENDING  % Incomplete  . Basophils Absolute 05/20/2018 PENDING  0.0 - 0.1 K/uL Incomplete  . WBC Morphology 05/20/2018 PENDING   Incomplete  . RBC Morphology 05/20/2018 PENDING   Incomplete  . Smear Review 05/20/2018 PENDING   Incomplete  . Other 05/20/2018 PENDING   % Incomplete  . nRBC 05/20/2018 PENDING  0 /100 WBC Incomplete  . Metamyelocytes Relative 05/20/2018 PENDING  % Incomplete  . Myelocytes 05/20/2018 PENDING  % Incomplete  . Promyelocytes Relative 05/20/2018 PENDING  % Incomplete  . Blasts 05/20/2018 PENDING  % Incomplete    (this displays the last labs from the last 3 days)  No results found for: TOTALPROTELP, ALBUMINELP, A1GS, A2GS, BETS, BETA2SER, GAMS, MSPIKE, SPEI (this displays SPEP labs)  No results found for: KPAFRELGTCHN, LAMBDASER, KAPLAMBRATIO (kappa/lambda light chains)  No results found for: HGBA, HGBA2QUANT, HGBFQUANT, HGBSQUAN (Hemoglobinopathy evaluation)   No results found for: LDH  No results found for: IRON, TIBC, IRONPCTSAT (Iron and TIBC)  No results found for: FERRITIN  Urinalysis No results found for: COLORURINE, APPEARANCEUR, LABSPEC, PHURINE, GLUCOSEU, HGBUR, BILIRUBINUR, KETONESUR, PROTEINUR, UROBILINOGEN, NITRITE, LEUKOCYTESUR   STUDIES: No results found.  ELIGIBLE FOR AVAILABLE RESEARCH PROTOCOL: BCEP  ASSESSMENT: 47 y.o.  [place], [place] woman status post right breast upper inner quadrant biopsy 12/29/2017, for a clinical T1c N0, stage IA invasive ductal carcinoma, triple positive, with an MIB-1 of 80%.  (1) genetics testing 02/02/2018 though the CancerNext gene panel offered by [facility] showed no deleterious mutations in  APC, ATM, BARD1, BMPR1A,BRCA1, BRCA2, BRIP1, CDH1, CDK4, CDKN2A, CHEK2, DICER1, HOXB13, MLH1, MRE11A, MSH2, MSH6, MUTYH, NBN, NF1, PALB2, PMS2, POLD1, POLE, PTEN, RAD50, RAD51C, RAD51D, SMAD4, SMARCA4, STK11 and TP53 (sequencing and deletion/duplication); EPCAM and GREM1 (deletion/duplication only).  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>A) )   (2) right lumpectomy and sentinel lymph node sampling 02/16/2018 showed a pT2 pN0, stage IB invasive ductal carcinoma, grade 3, with a positive inferior margin; a total of 5 lymph nodes were  removed  (a) additional surgery 02/27/2018 clear the margins  (3) started carboplatin, docetaxel, trastuzumab and Pertuzumab 03/11/2018, to be repeated every 21 days  x 6.    (a) Pertuzumab omitted after cycle 1 due to diarrhea.  (4) continue trastuzumab to total 6 months  (a) echocardiogram 01/21/2018 showed an ejection fraction in the 55-60% range  (b) repeat echocardiogram 06/14/2018 (pending).  (5) adjuvant radiation to follow  (6) antiestrogens to start at the completion of local treatment  PLAN:  [place] is doing well today.  She tolerated her fourth cycle of chemotherapy with Docetaxel, Carboplatin and trastuzumab very well.  Her labs are stable and I reviewed those with her in detail.  She notes her symptoms were improved with receiving the Neulasta the Monday following chemotherapy, but says the onpro is more convenient for her lifestyle.    I reached out to [facility] who is going to get her connected with radiation oncology to review timing of her appointments so her work can schedule her appropriately.  Maurie has had some challenges with accommodations for her work and is hoping to get those settled with planning ahead.    I cannot find where she has an echocardiogram scheduled.  I have asked my nurse to look into this since she is seen in the cardio oncology clinic with Dr. Aundra Dubin and Dr. Haroldine Laws.  Harvey will return in two weeks for labs, f/u, and her fifth cycle of adjuvant Docetaxel, carboplatin, and trastuzumab.  She knows to call for any other problems that may develop before the next visit.  A total of (20) minutes of face-to-face time was spent with this patient with greater than 50% of that time in counseling and care-coordination.    Monica Bihari, NP 05/20/18 1:38 PM Medical Oncology and Hematology [facility] [address] [place], [place] [address] Tel. [phone]    Fax. [phone]

## 2018-05-22 ENCOUNTER — Other Ambulatory Visit: Payer: Self-pay | Admitting: Oncology

## 2018-05-22 DIAGNOSIS — Z170 Estrogen receptor positive status [ER+]: Secondary | ICD-10-CM

## 2018-05-22 DIAGNOSIS — C50211 Malignant neoplasm of upper-inner quadrant of right female breast: Secondary | ICD-10-CM

## 2018-05-22 MED ORDER — CHOLESTYRAMINE 4 G PO PACK: 4.0000 g | PACK | Freq: Two times a day (BID) | ORAL | 1 refills | Status: DC

## 2018-05-23 ENCOUNTER — Telehealth: Payer: Self-pay | Admitting: Adult Health

## 2018-05-23 NOTE — Telephone Encounter (Signed)
Printed calendar and avs. °

## 2018-06-01 ENCOUNTER — Other Ambulatory Visit: Payer: Self-pay | Admitting: Oncology

## 2018-06-01 DIAGNOSIS — C50211 Malignant neoplasm of upper-inner quadrant of right female breast: Secondary | ICD-10-CM

## 2018-06-01 DIAGNOSIS — Z170 Estrogen receptor positive status [ER+]: Secondary | ICD-10-CM

## 2018-06-03 ENCOUNTER — Inpatient Hospital Stay (HOSPITAL_BASED_OUTPATIENT_CLINIC_OR_DEPARTMENT_OTHER): Payer: Managed Care, Other (non HMO) | Admitting: Adult Health

## 2018-06-03 ENCOUNTER — Inpatient Hospital Stay: Payer: Managed Care, Other (non HMO)

## 2018-06-03 ENCOUNTER — Encounter: Payer: Self-pay | Admitting: *Deleted

## 2018-06-03 ENCOUNTER — Other Ambulatory Visit: Payer: Self-pay | Admitting: *Deleted

## 2018-06-03 ENCOUNTER — Telehealth: Payer: Self-pay | Admitting: Adult Health

## 2018-06-03 ENCOUNTER — Encounter: Payer: Self-pay | Admitting: Adult Health

## 2018-06-03 VITALS — BP 136/92 | HR 69 | Temp 98.1°F | Resp 18 | Ht 67.5 in | Wt 164.4 lb

## 2018-06-03 DIAGNOSIS — Z170 Estrogen receptor positive status [ER+]: Secondary | ICD-10-CM

## 2018-06-03 DIAGNOSIS — Z5111 Encounter for antineoplastic chemotherapy: Secondary | ICD-10-CM | POA: Diagnosis not present

## 2018-06-03 DIAGNOSIS — N951 Menopausal and female climacteric states: Secondary | ICD-10-CM | POA: Diagnosis not present

## 2018-06-03 DIAGNOSIS — C50211 Malignant neoplasm of upper-inner quadrant of right female breast: Secondary | ICD-10-CM | POA: Diagnosis not present

## 2018-06-03 DIAGNOSIS — R61 Generalized hyperhidrosis: Secondary | ICD-10-CM | POA: Diagnosis not present

## 2018-06-03 DIAGNOSIS — T451X5A Adverse effect of antineoplastic and immunosuppressive drugs, initial encounter: Secondary | ICD-10-CM

## 2018-06-03 DIAGNOSIS — Z95828 Presence of other vascular implants and grafts: Secondary | ICD-10-CM

## 2018-06-03 DIAGNOSIS — G620 Drug-induced polyneuropathy: Secondary | ICD-10-CM

## 2018-06-03 LAB — CMP (CANCER CENTER ONLY)
ALT: 67 U/L — ABNORMAL HIGH (ref 0–44)
AST: 38 U/L (ref 15–41)
Albumin: 3.9 g/dL (ref 3.5–5.0)
Alkaline Phosphatase: 65 U/L (ref 38–126)
Anion gap: 8 (ref 5–15)
BUN: 15 mg/dL (ref 6–20)
CO2: 25 mmol/L (ref 22–32)
Calcium: 9.3 mg/dL (ref 8.9–10.3)
Chloride: 109 mmol/L (ref 98–111)
Creatinine: 0.77 mg/dL (ref 0.44–1.00)
GFR, Est AFR Am: >60 mL/min (ref >60)
GFR, Estimated: >60 mL/min (ref >60)
Glucose, Bld: 92 mg/dL (ref 70–99)
Potassium: 3.6 mmol/L (ref 3.5–5.1)
Sodium: 142 mmol/L (ref 135–145)
Total Bilirubin: 0.4 mg/dL (ref 0.3–1.2)
Total Protein: 6.5 g/dL (ref 6.5–8.1)

## 2018-06-03 LAB — CBC WITH DIFFERENTIAL (CANCER CENTER ONLY)
Abs Immature Granulocytes: 0.04 10*3/uL (ref 0.00–0.07)
Basophils Absolute: 0 10*3/uL (ref 0.0–0.1)
Basophils Relative: 0 %
Eosinophils Absolute: 0 10*3/uL (ref 0.0–0.5)
Eosinophils Relative: 0 %
HCT: 31.9 % — ABNORMAL LOW (ref 36.0–46.0)
Hemoglobin: 10.4 g/dL — ABNORMAL LOW (ref 12.0–15.0)
Immature Granulocytes: 0 %
Lymphocytes Relative: 17 %
Lymphs Abs: 1.7 10*3/uL (ref 0.7–4.0)
MCH: 32.4 pg (ref 26.0–34.0)
MCHC: 32.6 g/dL (ref 30.0–36.0)
MCV: 99.4 fL (ref 80.0–100.0)
Monocytes Absolute: 0.8 10*3/uL (ref 0.1–1.0)
Monocytes Relative: 9 %
Neutro Abs: 7 10*3/uL (ref 1.7–7.7)
Neutrophils Relative %: 74 %
Platelet Count: 187 10*3/uL (ref 150–400)
RBC: 3.21 MIL/uL — ABNORMAL LOW (ref 3.87–5.11)
RDW: 17.2 % — ABNORMAL HIGH (ref 11.5–15.5)
WBC Count: 9.5 10*3/uL (ref 4.0–10.5)
nRBC: 0 % (ref 0.0–0.2)

## 2018-06-03 MED ORDER — LIDOCAINE-PRILOCAINE 2.5-2.5 % EX CREA: TOPICAL_CREAM | CUTANEOUS | 3 refills | Status: DC

## 2018-06-03 MED ORDER — ONDANSETRON HCL 8 MG PO TABS: 8.0000 mg | ORAL_TABLET | Freq: Three times a day (TID) | ORAL | 0 refills | Status: DC | PRN

## 2018-06-03 MED ORDER — SODIUM CHLORIDE 0.9% FLUSH
10.0000 mL | Freq: Once | INTRAVENOUS | Status: AC
  Administered 2018-06-03: 10 mL
  Filled 2018-06-03: qty 10

## 2018-06-03 MED ORDER — GABAPENTIN 300 MG PO CAPS: 300.0000 mg | ORAL_CAPSULE | Freq: Every day | ORAL | 2 refills | Status: DC

## 2018-06-03 MED ORDER — PROCHLORPERAZINE MALEATE 10 MG PO TABS: 10.0000 mg | ORAL_TABLET | Freq: Four times a day (QID) | ORAL | 1 refills | Status: DC | PRN

## 2018-06-03 NOTE — Progress Notes (Signed)
[place]  Telephone:([phone] [phone]     ID: Monica [name] DOB: 1971-06-05  MR#: [id number]  AQT#:[id number]  Patient Care Team: [name], PA-C as PCP - General (Family Medicine) [name], MD as Consulting Physician (General Surgery) Magrinat, [name], MD as Consulting Physician (Oncology) [name], MD as Consulting Physician (Radiation Oncology) [name], MD as Consulting Physician (Obstetrics and Gynecology) OTHER MD: [facility]  CHIEF COMPLAINT: triple positive breast cancer  CURRENT TREATMENT: adjuvant chemo-immunotherapy  HISTORY OF CURRENT ILLNESS: From the original intake note:  Monica [name] (pronounced "uric") palpated a right supraclavicular mass and felt tenderness after stretching one day. She brought this to her PA's attention.  The patient underwent bilateral diagnostic mammography with tomography and right breast ultrasonography at [facility] on 12/20/2017 showing: breast density category C. There was a suspicious palpable mass at the 1 o'clock position upper inner quadrant measuring 1.4 x 1.3 x 1.3 cm located 12 cm from the nipple. There was an additional mass at the 8 o'clock radiant measuring 1.2 x 1.0 x 1.4 cm, which likely represented a cyst. Ultrasound evaluation of the right axilla demonstrates no evidence of lymphadenopathy  Accordingly on 12/29/2017 she proceeded to biopsy of the right breast area in question. The pathology from this procedure showed ([id number]): Invasive ductal carcinoma, grade III. Prognostic indicators significant for: estrogen receptor, 80% positive and progesterone receptor, 70% positive, both with strong staining intensity. Proliferation marker Ki67 at 80%. HER2 amplified with ratios HER2/CEP17 signals 3.44 and average HER2 copies per cell 10.65.  The patient's subsequent history is as detailed below.  INTERVAL HISTORY: Monica [name] returns today for follow up and  treatment of her triple positive breast cancer. She receives carboplatin, docetaxel, trastuzumab, and pertuzumab given every 21 days. (Pertuzumab discontinued due to diarrhea).  Today is day 1 cycle 5 of 6 planned cycles.   REVIEW OF SYSTEMS: Monica [name] notes some pain and throbbing in her fingertips.  This is improved from when it started last week.  She notes the numbness is constant.  The pain in her fingertips is intermittent.  She is taking Naproxen and Tylenol.  She does have some nail changes. She notes difficulty with sleeping and night sweats.  She needs refills on Compazine and Zofran.  She is otherwise doing well and denies any other issues or concerns.  A detailed ROS was otherwise non contributory today.    PAST MEDICAL HISTORY: Past Medical History:  Diagnosis Date  . Arthritis    lower back  . Cancer [facility])    right breast cancer  . Family history of breast cancer   She notes that she had a heart murmur as a child, which she grew out of.   PAST SURGICAL HISTORY: Past Surgical History:  Procedure Laterality Date  . ABDOMINAL HYSTERECTOMY    . BREAST LUMPECTOMY WITH RADIOACTIVE SEED AND SENTINEL LYMPH NODE BIOPSY Right 02/16/2018   Procedure: RIGHT BREAST LUMPECTOMY WITH RADIOACTIVE SEED AND SENTINEL LYMPH NODE BIOPSY;  Surgeon: [name], MD;  Location: [facility];  Service: General;  Laterality: Right;  . HYMENECTOMY    . PORTACATH PLACEMENT Right 02/16/2018   Procedure: INSERTION PORT-A-CATH;  Surgeon: [name], MD;  Location: [facility];  Service: General;  Laterality: Right;  . RE-EXCISION OF BREAST LUMPECTOMY Right 02/22/2018   Procedure: RE-EXCISION OF RIGHT  BREAST LUMPECTOMY;  Surgeon: [name], MD;  Location: [place];  Service: General;  Laterality: Right;  .  REPAIR VAGINAL CUFF N/A 02/07/2017   Procedure: REPAIR VAGINAL CUFF;  Surgeon: Ena Dawley, MD;  Location: [facility];  Service: Gynecology;  Laterality:  N/A;  . ROBOTIC ASSISTED TOTAL HYSTERECTOMY WITH SALPINGECTOMY Left 01/20/2017   Procedure: ROBOTIC ASSISTED TOTAL HYSTERECTOMY WITH SALPINGECTOMY;  Surgeon: [name], MD;  Location: [facility];  Service: Gynecology;  Laterality: Left;  Partial Hysterectomy without BSO  FAMILY HISTORY Family History  Problem Relation Age of Onset  . Lung cancer Maternal Grandfather   . Esophageal cancer Paternal Grandfather 71  . Breast cancer Cousin 95  As of July 2019, the patient's father is alive at 20. The patient's mother is also alive at 5. The patient has 1 brother and 5 sisters. There was a maternal grandfather diagnosed with lung cancer at 25. There was a paternal grandfather diagnosed with esophageal cancer at 42. The patient's father had a pre-cancerous esophageal finding at age 21. There was also a maternal 1st cousin diagnosed with metastatic breast cancer at age 37.    GYNECOLOGIC HISTORY:  Patient's last menstrual period was 12/21/2016 (exact date). Menarche: 47 years old Age at first live birth: 47 years old She is GXP2. She is status post partial hysterectomy without BSO in 2018 She never used HRT. She used oral contraception over 21 years ago with no complications.   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 The patient is separated from her husband, [name]. The patient's son Monica [name] age 22 is in the [employer] stationed in [place] in the Horticulturist, commercial. He plans on working in the railroad industry after he returns. The patient's daughter Monica [name] age 6, works at [employer].      ADVANCED DIRECTIVES: Not in place   HEALTH MAINTENANCE: Social History   Tobacco Use  . Smoking status: Never Smoker  . Smokeless tobacco: Never Used  Substance Use Topics  . Alcohol use: Yes    Comment: occ  . Drug use: No     Colonoscopy:   PAP: 2018/ prior to hysterectomy  Bone density:   No Known Allergies  Current Outpatient Medications    Medication Sig Dispense Refill  . Acetaminophen (TYLENOL PO) Take by mouth 3 (three) times daily. 2 tabs as needed for aches and pains    . cholestyramine (QUESTRAN) 4 g packet Take 1 packet (4 g total) by mouth 2 (two) times daily. 180 packet 1  . dexamethasone (DECADRON) 4 MG tablet Take 2 tablets (8 mg total) by mouth 2 (two) times daily. Start the day before Taxotere. Take once the day after, then 2 times a day x 2d. 30 tablet 1  . fluconazole (DIFLUCAN) 100 MG tablet Take 1 tablet (100 mg total) by mouth daily. 20 tablet 1  . ibuprofen (ADVIL,MOTRIN) 800 MG tablet Take 1 tablet (800 mg total) by mouth every 8 (eight) hours as needed. 30 tablet 0  . lidocaine-prilocaine (EMLA) cream Apply to affected area once 30 g 3  . loratadine (CLARITIN) 10 MG tablet Take 10 mg by mouth daily.    Marland Kitchen LORazepam (ATIVAN) 0.5 MG tablet TAKE 1 TABLET (0.5 MG TOTAL) BY MOUTH AT BEDTIME AS NEEDED (NAUSEA OR VOMITING). 30 tablet 0  . magic mouthwash w/lidocaine SOLN Take 5 mLs by mouth 4 (four) times daily as needed for mouth pain. Swish, Swallow, or spit. 240 mL 1  . NAPROXEN PO Take by mouth as needed. 1 tab as needed for aches and pain    . nystatin (  MYCOSTATIN) 100000 UNIT/ML suspension Take 5 mLs (500,000 Units total) by mouth 4 (four) times daily. 60 mL 0  . ondansetron (ZOFRAN) 8 MG tablet Take 1 tablet (8 mg total) by mouth every 8 (eight) hours as needed for nausea or vomiting. 20 tablet 0  . oxyCODONE (OXY IR/ROXICODONE) 5 MG immediate release tablet Take 1 tablet (5 mg total) by mouth every 6 (six) hours as needed for severe pain. 15 tablet 0  . Probiotic Product (PROBIOTIC PO) Take by mouth daily.    . prochlorperazine (COMPAZINE) 10 MG tablet TAKE 1 TABLET (10 MG TOTAL) BY MOUTH EVERY 6 (SIX) HOURS AS NEEDED (NAUSEA OR VOMITING). 30 tablet 1   No current facility-administered medications for this visit.     OBJECTIVE:  Vitals:   06/03/18 1029  BP: (!) 136/92  Pulse: 69  Resp: 18  Temp: 98.1 F  (36.7 C)  SpO2: 100%     Body mass index is 25.37 kg/m.   Wt Readings from Last 3 Encounters:  06/03/18 164 lb 6.4 oz (74.6 kg)  05/20/18 156 lb 9.6 oz (71 kg)  05/13/18 161 lb 11.2 oz (73.3 kg)  ECOG FS:1 GENERAL: Patient is a well appearing female in no acute distress HEENT:  Sclerae anicteric.  Oropharynx clear and moist. No ulcerations or evidence of oropharyngeal candidiasis. Neck is supple.  NODES:  No cervical, supraclavicular, or axillary lymphadenopathy palpated.  BREAST EXAM: right breast s/p lumpectomy, no sign of local recurrence, left breast benign LUNGS:  Clear to auscultation bilaterally.  No wheezes or rhonchi. HEART:  Regular rate and rhythm. No murmur appreciated. ABDOMEN:  Soft, nontender.  Positive, normoactive bowel sounds. No organomegaly palpated. MSK:  No focal spinal tenderness to palpation. Full range of motion bilaterally in the upper extremities. EXTREMITIES:  No peripheral edema.   SKIN:  Clear with no obvious rashes or skin changes. No nail dyscrasia. NEURO:  Nonfocal. Well oriented.  Appropriate affect.      LAB RESULTS:  CMP     Component Value Date/Time   NA 142 06/03/2018 0947   K 3.6 06/03/2018 0947   CL 109 06/03/2018 0947   CO2 25 06/03/2018 0947   GLUCOSE 92 06/03/2018 0947   BUN 15 06/03/2018 0947   CREATININE 0.77 06/03/2018 0947   CALCIUM 9.3 06/03/2018 0947   PROT 6.5 06/03/2018 0947   ALBUMIN 3.9 06/03/2018 0947   AST 38 06/03/2018 0947   ALT 67 (H) 06/03/2018 0947   ALKPHOS 65 06/03/2018 0947   BILITOT 0.4 06/03/2018 0947   GFRNONAA >60 06/03/2018 0947   GFRAA >60 06/03/2018 0947    No results found for: TOTALPROTELP, ALBUMINELP, A1GS, A2GS, BETS, BETA2SER, GAMS, MSPIKE, SPEI  No results found for: KPAFRELGTCHN, LAMBDASER, KAPLAMBRATIO  Lab Results  Component Value Date   WBC 9.5 06/03/2018   NEUTROABS 7.0 06/03/2018   HGB 10.4 (L) 06/03/2018   HCT 31.9 (L) 06/03/2018   MCV 99.4 06/03/2018   PLT 187 06/03/2018     _0 @  No results found for: LABCA2  No components found for: ZWCHEN277  No results for input(s): INR in the last 168 hours.  No results found for: LABCA2  No results found for: OEU235  No results found for: TIR443  No results found for: XVQ008  No results found for: CA2729  No components found for: HGQUANT  No results found for: CEA1 / No results found for: CEA1   No results found for: AFPTUMOR  No results found for: [place]  No  results found for: PSA1  Appointment on 06/03/2018  Component Date Value Ref Range Status  . WBC Count 06/03/2018 9.5  4.0 - 10.5 K/uL Final  . RBC 06/03/2018 3.21* 3.87 - 5.11 MIL/uL Final  . Hemoglobin 06/03/2018 10.4* 12.0 - 15.0 g/dL Final  . HCT 06/03/2018 31.9* 36.0 - 46.0 % Final  . MCV 06/03/2018 99.4  80.0 - 100.0 fL Final  . MCH 06/03/2018 32.4  26.0 - 34.0 pg Final  . MCHC 06/03/2018 32.6  30.0 - 36.0 g/dL Final  . RDW 06/03/2018 17.2* 11.5 - 15.5 % Final  . Platelet Count 06/03/2018 187  150 - 400 K/uL Final  . nRBC 06/03/2018 0.0  0.0 - 0.2 % Final  . Neutrophils Relative % 06/03/2018 74  % Final  . Neutro Abs 06/03/2018 7.0  1.7 - 7.7 K/uL Final  . Lymphocytes Relative 06/03/2018 17  % Final  . Lymphs Abs 06/03/2018 1.7  0.7 - 4.0 K/uL Final  . Monocytes Relative 06/03/2018 9  % Final  . Monocytes Absolute 06/03/2018 0.8  0.1 - 1.0 K/uL Final  . Eosinophils Relative 06/03/2018 0  % Final  . Eosinophils Absolute 06/03/2018 0.0  0.0 - 0.5 K/uL Final  . Basophils Relative 06/03/2018 0  % Final  . Basophils Absolute 06/03/2018 0.0  0.0 - 0.1 K/uL Final  . Immature Granulocytes 06/03/2018 0  % Final  . Abs Immature Granulocytes 06/03/2018 0.04  0.00 - 0.07 K/uL Final   Performed at [facility], [place] [address] [place], [place] [address]  . Sodium 06/03/2018 142  135 - 145 mmol/L Final  . Potassium 06/03/2018 3.6  3.5 - 5.1 mmol/L Final  . Chloride 06/03/2018 109  98 - 111 mmol/L  Final  . CO2 06/03/2018 25  22 - 32 mmol/L Final  . Glucose, Bld 06/03/2018 92  70 - 99 mg/dL Final  . BUN 06/03/2018 15  6 - 20 mg/dL Final  . Creatinine 06/03/2018 0.77  0.44 - 1.00 mg/dL Final  . Calcium 06/03/2018 9.3  8.9 - 10.3 mg/dL Final  . Total Protein 06/03/2018 6.5  6.5 - 8.1 g/dL Final  . Albumin 06/03/2018 3.9  3.5 - 5.0 g/dL Final  . AST 06/03/2018 38  15 - 41 U/L Final  . ALT 06/03/2018 67* 0 - 44 U/L Final  . Alkaline Phosphatase 06/03/2018 65  38 - 126 U/L Final  . Total Bilirubin 06/03/2018 0.4  0.3 - 1.2 mg/dL Final  . GFR, Est Non Af Am 06/03/2018 >60  >60 mL/min Final  . GFR, Est AFR Am 06/03/2018 >60  >60 mL/min Final   Comment: (NOTE) The eGFR has been calculated using the CKD EPI equation. This calculation has not been validated in all clinical situations. eGFR's persistently <60 mL/min signify possible Chronic Kidney Disease.   Georgiann Hahn gap 06/03/2018 8  5 - 15 Final   Performed at [facility], [place] [address], [place], [place] [address]    (this displays the last labs from the last 3 days)  No results found for: TOTALPROTELP, ALBUMINELP, A1GS, A2GS, BETS, BETA2SER, GAMS, MSPIKE, SPEI (this displays SPEP labs)  No results found for: KPAFRELGTCHN, LAMBDASER, KAPLAMBRATIO (kappa/lambda light chains)  No results found for: HGBA, HGBA2QUANT, HGBFQUANT, HGBSQUAN (Hemoglobinopathy evaluation)   No results found for: LDH  No results found for: IRON, TIBC, IRONPCTSAT (Iron and TIBC)  No results found for: FERRITIN  Urinalysis No results found for: COLORURINE, APPEARANCEUR, [place], [place], GLUCOSEU, HGBUR,  BILIRUBINUR, KETONESUR, PROTEINUR, UROBILINOGEN, NITRITE, LEUKOCYTESUR   STUDIES: No results found.  ELIGIBLE FOR AVAILABLE RESEARCH PROTOCOL: BCEP  ASSESSMENT: 47 y.o.  [place], [place] woman status post right breast upper inner quadrant biopsy 12/29/2017, for a clinical T1c N0, stage IA invasive ductal carcinoma, triple  positive, with an MIB-1 of 80%.  (1) genetics testing 02/02/2018 though the CancerNext gene panel offered by [facility] showed no deleterious mutations in  APC, ATM, BARD1, BMPR1A,BRCA1, BRCA2, BRIP1, CDH1, CDK4, CDKN2A, CHEK2, DICER1, HOXB13, MLH1, MRE11A, MSH2, MSH6, MUTYH, NBN, NF1, PALB2, PMS2, POLD1, POLE, PTEN, RAD50, RAD51C, RAD51D, SMAD4, SMARCA4, STK11 and TP53 (sequencing and deletion/duplication); EPCAM and GREM1 (deletion/duplication only).  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>A) )   (2) right lumpectomy and sentinel lymph node sampling 02/16/2018 showed a pT2 pN0, stage IB invasive ductal carcinoma, grade 3, with a positive inferior margin; a total of 5 lymph nodes were removed  (a) additional surgery 02/27/2018 clear the margins  (3) started carboplatin, docetaxel, trastuzumab and Pertuzumab 03/11/2018, to be repeated every 21 days x 6.    (a) Pertuzumab omitted after cycle 1 due to diarrhea.  (b) Gemcitabine substituted for Docetaxel starting with cycle 5 due to peripheral neuropathy  (4) continue trastuzumab to total 6 months  (a) echocardiogram 01/21/2018 showed an ejection fraction in the 55-60% range  (b) repeat echocardiogram 06/14/2018 (pending).  (5) adjuvant radiation to follow  (6) antiestrogens to start at the completion of local treatment  PLAN:  Chamya is doing well today.  Her CBC is stable.  Unfortunately, she has developed peripheral neuropathy since her last visit.  I reviewed this with Dr. Jana Hakim.  She will not receive chemotherapy today.    We will delay chemo by one week and substitute Gemcitabine at 862m/m2 for the Docetaxel.  She would like to get this on 11/30, and since we are open that day I requested this being scheduled.    For her night sweats/hot flashes, and neuropathy, I have written for her to receive Gabapentin 3015mPO QHS.  Jamoni has several appointment requests and we will do our best to  accommodate those.  She is going out of town on 12/20-1/1.  I will ask Dr. MaJana Hakimo review her schedule to ensure she is getting the best chance at cure.  She knows to call for any other problems that may develop before the next visit.  A total of (30) minutes of face-to-face time was spent with this patient with greater than 50% of that time in counseling and care-coordination.    LiWilber BihariNP 06/03/18 11:05 AM [facility] [id number]. [phone]  Fax. [phone]

## 2018-06-03 NOTE — Telephone Encounter (Signed)
Gave patient avs and calendar.  On 12/9, patient requested to have labs thru arm and needed morning appt. (7:45 not avail so scheduled for 8 am).

## 2018-06-03 NOTE — Patient Instructions (Signed)

## 2018-06-04 ENCOUNTER — Encounter: Payer: Self-pay | Admitting: Oncology

## 2018-06-06 ENCOUNTER — Other Ambulatory Visit (HOSPITAL_COMMUNITY): Payer: Managed Care, Other (non HMO)

## 2018-06-06 ENCOUNTER — Other Ambulatory Visit: Payer: Self-pay | Admitting: *Deleted

## 2018-06-06 DIAGNOSIS — C50211 Malignant neoplasm of upper-inner quadrant of right female breast: Secondary | ICD-10-CM

## 2018-06-06 DIAGNOSIS — Z170 Estrogen receptor positive status [ER+]: Secondary | ICD-10-CM

## 2018-06-10 ENCOUNTER — Telehealth: Payer: Self-pay

## 2018-06-10 ENCOUNTER — Ambulatory Visit: Payer: Managed Care, Other (non HMO) | Admitting: Hematology and Oncology

## 2018-06-10 ENCOUNTER — Encounter: Payer: Self-pay | Admitting: *Deleted

## 2018-06-10 ENCOUNTER — Other Ambulatory Visit: Payer: Managed Care, Other (non HMO)

## 2018-06-10 ENCOUNTER — Inpatient Hospital Stay: Payer: Managed Care, Other (non HMO)

## 2018-06-10 DIAGNOSIS — Z5111 Encounter for antineoplastic chemotherapy: Secondary | ICD-10-CM | POA: Diagnosis not present

## 2018-06-10 DIAGNOSIS — Z170 Estrogen receptor positive status [ER+]: Secondary | ICD-10-CM

## 2018-06-10 DIAGNOSIS — C50211 Malignant neoplasm of upper-inner quadrant of right female breast: Secondary | ICD-10-CM

## 2018-06-10 LAB — CBC WITH DIFFERENTIAL (CANCER CENTER ONLY)
Abs Immature Granulocytes: 0.03 10*3/uL (ref 0.00–0.07)
Basophils Absolute: 0.1 10*3/uL (ref 0.0–0.1)
Basophils Relative: 1 %
Eosinophils Absolute: 0 10*3/uL (ref 0.0–0.5)
Eosinophils Relative: 1 %
HCT: 35.5 % — ABNORMAL LOW (ref 36.0–46.0)
Hemoglobin: 11.2 g/dL — ABNORMAL LOW (ref 12.0–15.0)
Immature Granulocytes: 0 %
Lymphocytes Relative: 19 %
Lymphs Abs: 1.4 10*3/uL (ref 0.7–4.0)
MCH: 32.1 pg (ref 26.0–34.0)
MCHC: 31.5 g/dL (ref 30.0–36.0)
MCV: 101.7 fL — ABNORMAL HIGH (ref 80.0–100.0)
Monocytes Absolute: 0.7 10*3/uL (ref 0.1–1.0)
Monocytes Relative: 9 %
Neutro Abs: 5 10*3/uL (ref 1.7–7.7)
Neutrophils Relative %: 70 %
Platelet Count: 240 10*3/uL (ref 150–400)
RBC: 3.49 MIL/uL — ABNORMAL LOW (ref 3.87–5.11)
RDW: 16.6 % — ABNORMAL HIGH (ref 11.5–15.5)
WBC Count: 7.2 10*3/uL (ref 4.0–10.5)
nRBC: 0 % (ref 0.0–0.2)

## 2018-06-10 LAB — CMP (CANCER CENTER ONLY)
ALT: 27 U/L (ref 0–44)
AST: 19 U/L (ref 15–41)
Albumin: 3.8 g/dL (ref 3.5–5.0)
Alkaline Phosphatase: 59 U/L (ref 38–126)
Anion gap: 8 (ref 5–15)
BUN: 16 mg/dL (ref 6–20)
CO2: 26 mmol/L (ref 22–32)
Calcium: 9.3 mg/dL (ref 8.9–10.3)
Chloride: 108 mmol/L (ref 98–111)
Creatinine: 0.7 mg/dL (ref 0.44–1.00)
GFR, Est AFR Am: >60 mL/min (ref >60)
GFR, Estimated: >60 mL/min (ref >60)
Glucose, Bld: 94 mg/dL (ref 70–99)
Potassium: 4.2 mmol/L (ref 3.5–5.1)
Sodium: 142 mmol/L (ref 135–145)
Total Bilirubin: 0.4 mg/dL (ref 0.3–1.2)
Total Protein: 6.5 g/dL (ref 6.5–8.1)

## 2018-06-10 NOTE — Telephone Encounter (Signed)
Per patient request to cancel lab. Due to inf. Will access the port on Saturday treatment. Per 11/29 patient request. Walk in

## 2018-06-10 NOTE — Progress Notes (Signed)
06/10/18 at 9:07am - The research nurse and the pt met this morning to discuss the pt's upcoming UpBeat month 3 assessments.  The pt was informed that she needs to wear something comfortable on Monday, 06/13/18 because she will be doing the physical testing assessments ( 6 minute walk, balance tests, etc).  The pt was also reminded to fast for 3 hours before the pt's 8 am lab appt.  The pt was told that her month 3 research labs will be drawn on Monday.  The pt was also told that the study has had some recent changes and the pt will need to re-consent in order to continue participating in the study.  The research nurse reviewed all of the new changes with the pt.  The research nurse also gave the pt the new consent form and reviewed the new changes in the ICF with the pt.  The pt was asked to take home the consent form and read it before her month 3 appt on Monday, 06/13/18.  The pt agreed to read the new consent form.  The pt had no questions/concerns about the new study updates.  The pt was thanked for her continued support of this trial. Brion Aliment RN, BSN, CCRP Clinical Research Nurse 06/10/2018 9:33 AM   06/13/18 at 1:14pm - The pt was into the cancer center this morning for her routine chemo follow up appt with Dr. Jana Hakim and her month 3 UPBEAT assessments.  The pt said that she read the updated consent form.  The research nurse again discussed the new changes with the pt.  The pt said that she was comfortable continuing her participation in the trial.  The pt did not want to receive any texts from the study.  The pt then signed the new consent form at 8 am.  The pt was given a copy of her signed consent form for her records.  The research nurse reviewed the pt's current medications with the pt.  The pt's waist measurement was 39 inches.  The pt then had her research labs drawn, and her vitals, height, and weight were obtained for study purposes.  The pt was examined by Dr. Jana Hakim today, and he  stated that she is doing very well with her treatment.  Remer Macho, research specialist, met with the pt and conducted the neurocognitive testing portion for this timepoint.  The pt then completed her self-administered questionnaires.  The pt then completed her physicial testing assessments.  The pt then went to the MRI department and completed her month 3 cardiac MRI.  After the MRI, the pt signed for her $25 [facility] gift card.  The pt had completed all of her month 3 assessments.  The pt was thanked for her support of this trial. The pt said that she has 1 more chemo treatment left, and then she will proceed with radiation.  The pt was told that her next on-study visit will be at her month 12 visit in August 2020.  The pt was told that the research nurse will have to send her radiation data to [facility] for study reporting.  The pt verbalized understanding.   Brion Aliment RN, BSN, CCRP Clinical Research Nurse 06/13/2018 1:56 PM

## 2018-06-11 ENCOUNTER — Inpatient Hospital Stay: Payer: Managed Care, Other (non HMO)

## 2018-06-11 VITALS — BP 135/85 | HR 72 | Temp 98.6°F | Resp 18

## 2018-06-11 DIAGNOSIS — Z5111 Encounter for antineoplastic chemotherapy: Secondary | ICD-10-CM | POA: Diagnosis not present

## 2018-06-11 MED ORDER — DIPHENHYDRAMINE HCL 25 MG PO CAPS
25.0000 mg | ORAL_CAPSULE | Freq: Once | ORAL | Status: AC
  Administered 2018-06-11: 25 mg via ORAL

## 2018-06-11 MED ORDER — PALONOSETRON HCL INJECTION 0.25 MG/5ML
0.2500 mg | Freq: Once | INTRAVENOUS | Status: AC
  Administered 2018-06-11: 0.25 mg via INTRAVENOUS

## 2018-06-11 MED ORDER — TRASTUZUMAB CHEMO 150 MG IV SOLR
450.0000 mg | Freq: Once | INTRAVENOUS | Status: AC
  Administered 2018-06-11: 450 mg via INTRAVENOUS
  Filled 2018-06-11: qty 21.43

## 2018-06-11 MED ORDER — SODIUM CHLORIDE 0.9 % IV SOLN
Freq: Once | INTRAVENOUS | Status: AC
  Administered 2018-06-11: 08:00:00 via INTRAVENOUS
  Filled 2018-06-11: qty 250

## 2018-06-11 MED ORDER — PALONOSETRON HCL INJECTION 0.25 MG/5ML
INTRAVENOUS | Status: AC
  Filled 2018-06-11: qty 5

## 2018-06-11 MED ORDER — SODIUM CHLORIDE 0.9 % IV SOLN
800.0000 mg/m2 | Freq: Once | INTRAVENOUS | Status: AC
  Administered 2018-06-11: 1558 mg via INTRAVENOUS
  Filled 2018-06-11: qty 40.98

## 2018-06-11 MED ORDER — PEGFILGRASTIM 6 MG/0.6ML ~~LOC~~ PSKT
6.0000 mg | PREFILLED_SYRINGE | Freq: Once | SUBCUTANEOUS | Status: AC
  Administered 2018-06-11: 6 mg via SUBCUTANEOUS

## 2018-06-11 MED ORDER — SODIUM CHLORIDE 0.9% FLUSH
10.0000 mL | INTRAVENOUS | Status: DC | PRN
  Administered 2018-06-11: 10 mL
  Filled 2018-06-11: qty 10

## 2018-06-11 MED ORDER — SODIUM CHLORIDE 0.9 % IV SOLN
659.0000 mg | Freq: Once | INTRAVENOUS | Status: AC
  Administered 2018-06-11: 660 mg via INTRAVENOUS
  Filled 2018-06-11: qty 66

## 2018-06-11 MED ORDER — SODIUM CHLORIDE 0.9 % IV SOLN
Freq: Once | INTRAVENOUS | Status: AC
  Administered 2018-06-11: 09:00:00 via INTRAVENOUS
  Filled 2018-06-11: qty 5

## 2018-06-11 MED ORDER — PEGFILGRASTIM 6 MG/0.6ML ~~LOC~~ PSKT
PREFILLED_SYRINGE | SUBCUTANEOUS | Status: AC
  Filled 2018-06-11: qty 0.6

## 2018-06-11 MED ORDER — ACETAMINOPHEN 325 MG PO TABS
650.0000 mg | ORAL_TABLET | Freq: Once | ORAL | Status: AC
  Administered 2018-06-11: 650 mg via ORAL

## 2018-06-11 MED ORDER — HEPARIN SOD (PORK) LOCK FLUSH 100 UNIT/ML IV SOLN
500.0000 [IU] | Freq: Once | INTRAVENOUS | Status: AC | PRN
  Administered 2018-06-11: 500 [IU]
  Filled 2018-06-11: qty 5

## 2018-06-11 MED ORDER — DIPHENHYDRAMINE HCL 25 MG PO CAPS
ORAL_CAPSULE | ORAL | Status: AC
  Filled 2018-06-11: qty 1

## 2018-06-11 MED ORDER — ACETAMINOPHEN 325 MG PO TABS
ORAL_TABLET | ORAL | Status: AC
  Filled 2018-06-11: qty 2

## 2018-06-11 NOTE — Patient Instructions (Signed)
[facility] Discharge Instructions for Patients Receiving Chemotherapy  Today you received the following chemotherapy agents Trastuzumab (Herceptin), Gemzar (Gemcitabine) & Carboplatin (Paraplatin).   To help prevent nausea and vomiting after your treatment, we encourage you to take your nausea medication as prescribed.   If you develop nausea and vomiting that is not controlled by your nausea medication, call the clinic.   BELOW ARE SYMPTOMS THAT SHOULD BE REPORTED IMMEDIATELY:  *FEVER GREATER THAN 100.5 F  *CHILLS WITH OR WITHOUT FEVER  NAUSEA AND VOMITING THAT IS NOT CONTROLLED WITH YOUR NAUSEA MEDICATION  *UNUSUAL SHORTNESS OF BREATH  *UNUSUAL BRUISING OR BLEEDING  TENDERNESS IN MOUTH AND THROAT WITH OR WITHOUT PRESENCE OF ULCERS  *URINARY PROBLEMS  *BOWEL PROBLEMS  UNUSUAL RASH Items with * indicate a potential emergency and should be followed up as soon as possible.  Feel free to call the clinic should you have any questions or concerns. The clinic phone number is [phone].  Please show the [facility] at check-in to the Emergency Department and triage nurse.  Gemcitabine injection What is this medicine? GEMCITABINE (jem SIT a been) is a chemotherapy drug. This medicine is used to treat many types of cancer like breast cancer, lung cancer, pancreatic cancer, and ovarian cancer. This medicine may be used for other purposes; ask your health care provider or pharmacist if you have questions. COMMON BRAND NAME(S): Gemzar What should I tell my health care provider before I take this medicine? They need to know if you have any of these conditions: -blood disorders -infection -kidney disease -liver disease -recent or ongoing radiation therapy -an unusual or allergic reaction to gemcitabine, other chemotherapy, other medicines, foods, dyes, or preservatives -pregnant or trying to get pregnant -breast-feeding How should I use this medicine? This  drug is given as an infusion into a vein. It is administered in a hospital or clinic by a specially trained health care professional. Talk to your pediatrician regarding the use of this medicine in children. Special care may be needed. Overdosage: If you think you have taken too much of this medicine contact a poison control center or emergency room at once. NOTE: This medicine is only for you. Do not share this medicine with others. What if I miss a dose? It is important not to miss your dose. Call your doctor or health care professional if you are unable to keep an appointment. What may interact with this medicine? -medicines to increase blood counts like filgrastim, pegfilgrastim, sargramostim -some other chemotherapy drugs like cisplatin -vaccines Talk to your doctor or health care professional before taking any of these medicines: -acetaminophen -aspirin -ibuprofen -ketoprofen -naproxen This list may not describe all possible interactions. Give your health care provider a list of all the medicines, herbs, non-prescription drugs, or dietary supplements you use. Also tell them if you smoke, drink alcohol, or use illegal drugs. Some items may interact with your medicine. What should I watch for while using this medicine? Visit your doctor for checks on your progress. This drug may make you feel generally unwell. This is not uncommon, as chemotherapy can affect healthy cells as well as cancer cells. Report any side effects. Continue your course of treatment even though you feel ill unless your doctor tells you to stop. In some cases, you may be given additional medicines to help with side effects. Follow all directions for their use. Call your doctor or health care professional for advice if you get a fever, chills or sore throat,  or other symptoms of a cold or flu. Do not treat yourself. This drug decreases your body's ability to fight infections. Try to avoid being around people who are  sick. This medicine may increase your risk to bruise or bleed. Call your doctor or health care professional if you notice any unusual bleeding. Be careful brushing and flossing your teeth or using a toothpick because you may get an infection or bleed more easily. If you have any dental work done, tell your dentist you are receiving this medicine. Avoid taking products that contain aspirin, acetaminophen, ibuprofen, naproxen, or ketoprofen unless instructed by your doctor. These medicines may hide a fever. Women should inform their doctor if they wish to become pregnant or think they might be pregnant. There is a potential for serious side effects to an unborn child. Talk to your health care professional or pharmacist for more information. Do not breast-feed an infant while taking this medicine. What side effects may I notice from receiving this medicine? Side effects that you should report to your doctor or health care professional as soon as possible: -allergic reactions like skin rash, itching or hives, swelling of the face, lips, or tongue -low blood counts - this medicine may decrease the number of white blood cells, red blood cells and platelets. You may be at increased risk for infections and bleeding. -signs of infection - fever or chills, cough, sore throat, pain or difficulty passing urine -signs of decreased platelets or bleeding - bruising, pinpoint red spots on the skin, black, tarry stools, blood in the urine -signs of decreased red blood cells - unusually weak or tired, fainting spells, lightheadedness -breathing problems -chest pain -mouth sores -nausea and vomiting -pain, swelling, redness at site where injected -pain, tingling, numbness in the hands or feet -stomach pain -swelling of ankles, feet, hands -unusual bleeding Side effects that usually do not require medical attention (report to your doctor or health care professional if they continue or are  bothersome): -constipation -diarrhea -hair loss -loss of appetite -stomach upset This list may not describe all possible side effects. Call your doctor for medical advice about side effects. You may report side effects to FDA at 1-800-FDA-1088. Where should I keep my medicine? This drug is given in a hospital or clinic and will not be stored at home. NOTE: This sheet is a summary. It may not cover all possible information. If you have questions about this medicine, talk to your doctor, pharmacist, or health care provider.  2018 Elsevier/Gold Standard (2007-11-08 18:45:54)

## 2018-06-12 NOTE — Progress Notes (Signed)
[facility]  Telephone:([phone] Fax:([phone]) [phone]     ID: Monica [name] DOB: 1970/09/11  MR#: [id number]  LYY#:[id number]  Patient Care Team: [name], PA-C as PCP - General (Family Medicine) [name], MD as Consulting Physician (General Surgery) , [name], MD as Consulting Physician (Oncology) [name], MD as Consulting Physician (Radiation Oncology) [name], MD as Consulting Physician (Obstetrics and Gynecology) OTHER MD: [facility]  CHIEF COMPLAINT: triple positive breast cancer  CURRENT TREATMENT: adjuvant chemo-immunotherapy  HISTORY OF CURRENT ILLNESS: From the original intake note:  Monica [name] (pronounced "uric") palpated a right supraclavicular mass and felt tenderness after stretching one day. She brought this to her PA's attention.  The patient underwent bilateral diagnostic mammography with tomography and right breast ultrasonography at [facility] on 12/20/2017 showing: breast density category C. There was a suspicious palpable mass at the 1 o'clock position upper inner quadrant measuring 1.4 x 1.3 x 1.3 cm located 12 cm from the nipple. There was an additional mass at the 8 o'clock radiant measuring 1.2 x 1.0 x 1.4 cm, which likely represented a cyst. Ultrasound evaluation of the right axilla demonstrates no evidence of lymphadenopathy  Accordingly on 12/29/2017 she proceeded to biopsy of the right breast area in question. The pathology from this procedure showed ([id number]): Invasive ductal carcinoma, grade III. Prognostic indicators significant for: estrogen receptor, 80% positive and progesterone receptor, 70% positive, both with strong staining intensity. Proliferation marker Ki67 at 80%. HER2 amplified with ratios HER2/CEP17 signals 3.44 and average HER2 copies per cell 10.65.  The patient's subsequent history is as detailed below.  INTERVAL HISTORY: Monica [name] returns today for follow-up  of her triple positive breast cancer.  She received her fifth cycle of carboplatin now with gemcitabine, as well as trastuzumab on 06/11/2018. She feels a bit fatigued but otherwise is doing well. She took benadryl before leaving hospital which made her very tired. She drove herself home from treatment, ate food and took a 2 hour nap. Otherwise has no complaints or complications  She receives OnPro and is doing well, denies bone aches. She feels though her legs are tired and heavy but has then alleviated. No mouth sore, she thinks sucking on ice cubes helped not to develop any.  REVIEW OF SYSTEMS: Monica [name] reports that for exercise, before treatment she walked her dogs, did light weight lifting and yoga. She denies unusual headaches, visual changes, nausea, vomiting, or dizziness. There has been no unusual cough, phlegm production, or pleurisy. This been no change in bowel or bladder habits. She denies unexplained fatigue or unexplained weight loss, bleeding, rash, or fever. A detailed review of systems was otherwise stable.   PAST MEDICAL HISTORY: Past Medical History:  Diagnosis Date  . Arthritis    lower back  . Cancer [facility])    right breast cancer  . Family history of breast cancer   She notes that she had a heart murmur as a child, which she grew out of.   PAST SURGICAL HISTORY: Past Surgical History:  Procedure Laterality Date  . ABDOMINAL HYSTERECTOMY    . BREAST LUMPECTOMY WITH RADIOACTIVE SEED AND SENTINEL LYMPH NODE BIOPSY Right 02/16/2018   Procedure: RIGHT BREAST LUMPECTOMY WITH RADIOACTIVE SEED AND SENTINEL LYMPH NODE BIOPSY;  Surgeon: [name], MD;  Location: [facility];  Service: General;  Laterality: Right;  . HYMENECTOMY    . PORTACATH PLACEMENT Right 02/16/2018   Procedure: INSERTION PORT-A-CATH;  Surgeon: [name], MD;  Location: [facility];  Service: General;  Laterality: Right;  . RE-EXCISION OF BREAST LUMPECTOMY Right 02/22/2018    Procedure: RE-EXCISION OF RIGHT  BREAST LUMPECTOMY;  Surgeon: [name], MD;  Location: [facility];  Service: General;  Laterality: Right;  . REPAIR VAGINAL CUFF N/A 02/07/2017   Procedure: REPAIR VAGINAL CUFF;  Surgeon: Ena Dawley, MD;  Location: [facility];  Service: Gynecology;  Laterality: N/A;  . ROBOTIC ASSISTED TOTAL HYSTERECTOMY WITH SALPINGECTOMY Left 01/20/2017   Procedure: ROBOTIC ASSISTED TOTAL HYSTERECTOMY WITH SALPINGECTOMY;  Surgeon: [name], MD;  Location: [facility];  Service: Gynecology;  Laterality: Left;  Partial Hysterectomy without BSO  FAMILY HISTORY Family History  Problem Relation Age of Onset  . Lung cancer Maternal Grandfather   . Esophageal cancer Paternal Grandfather 16  . Breast cancer Cousin 56  As of July 2019, the patient's father is alive at 68. The patient's mother is also alive at 106. The patient has 1 brother and 5 sisters. There was a maternal grandfather diagnosed with lung cancer at 57. There was a paternal grandfather diagnosed with esophageal cancer at 11. The patient's father had a pre-cancerous esophageal finding at age 76. There was also a maternal 1st cousin diagnosed with metastatic breast cancer at age 50.    GYNECOLOGIC HISTORY:  Patient's last menstrual period was 12/21/2016 (exact date). Menarche: 47 years old Age at first live birth: 47 years old She is GXP2. She is status post partial hysterectomy without BSO in 2018 She never used HRT. She used oral contraception over 21 years ago with no complications.   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 The patient is separated from her husband, Monica [name]. The patient's son Monica [name] age 40 is in the [employer] stationed in [place] in the Horticulturist, commercial. He plans on working in the railroad industry after he returns. The patient's daughter Monica [name] age 49, works at [employer].      ADVANCED DIRECTIVES: Not in place   HEALTH  MAINTENANCE: Social History   Tobacco Use  . Smoking status: Never Smoker  . Smokeless tobacco: Never Used  Substance Use Topics  . Alcohol use: Yes    Comment: occ  . Drug use: No     Colonoscopy:   PAP: 2018/ prior to hysterectomy  Bone density:   No Known Allergies  Current Outpatient Medications  Medication Sig Dispense Refill  . Acetaminophen (TYLENOL PO) Take by mouth 3 (three) times daily. 2 tabs as needed for aches and pains    . cholestyramine (QUESTRAN) 4 g packet Take 1 packet (4 g total) by mouth 2 (two) times daily. 180 packet 1  . dexamethasone (DECADRON) 4 MG tablet Take 2 tablets (8 mg total) by mouth 2 (two) times daily. Start the day before Taxotere. Take once the day after, then 2 times a day x 2d. 30 tablet 1  . fluconazole (DIFLUCAN) 100 MG tablet Take 1 tablet (100 mg total) by mouth daily. 20 tablet 1  . gabapentin (NEURONTIN) 300 MG capsule Take 1 capsule (300 mg total) by mouth at bedtime. 30 capsule 2  . ibuprofen (ADVIL,MOTRIN) 800 MG tablet Take 1 tablet (800 mg total) by mouth every 8 (eight) hours as needed. 30 tablet 0  . lidocaine-prilocaine (EMLA) cream Apply to affected area once 30 g 3  . loratadine (CLARITIN) 10 MG tablet Take 10 mg by mouth daily.    Marland Kitchen LORazepam (ATIVAN) 0.5 MG tablet TAKE 1 TABLET (  0.5 MG TOTAL) BY MOUTH AT BEDTIME AS NEEDED (NAUSEA OR VOMITING). 30 tablet 0  . magic mouthwash w/lidocaine SOLN Take 5 mLs by mouth 4 (four) times daily as needed for mouth pain. Swish, Swallow, or spit. 240 mL 1  . NAPROXEN PO Take by mouth as needed. 1 tab as needed for aches and pain    . nystatin (MYCOSTATIN) 100000 UNIT/ML suspension Take 5 mLs (500,000 Units total) by mouth 4 (four) times daily. 60 mL 0  . ondansetron (ZOFRAN) 8 MG tablet Take 1 tablet (8 mg total) by mouth every 8 (eight) hours as needed for nausea or vomiting. 20 tablet 0  . oxyCODONE (OXY IR/ROXICODONE) 5 MG immediate release tablet Take 1 tablet (5 mg total) by mouth every  6 (six) hours as needed for severe pain. 15 tablet 0  . Probiotic Product (PROBIOTIC PO) Take by mouth daily.    . prochlorperazine (COMPAZINE) 10 MG tablet Take 1 tablet (10 mg total) by mouth every 6 (six) hours as needed (Nausea or vomiting). 30 tablet 1   No current facility-administered medications for this visit.     OBJECTIVE: Middle-aged white woman who appears well Vitals:   06/13/18 0815  BP: 120/81  Pulse: (!) 58  Resp: 20  Temp: 98.8 F (37.1 C)  SpO2: 100%     Body mass index is 25.88 kg/m.   Wt Readings from Last 3 Encounters:  06/13/18 167 lb 11.2 oz (76.1 kg)  06/03/18 164 lb 6.4 oz (74.6 kg)  05/20/18 156 lb 9.6 oz (71 kg)  ECOG FS:1  Sclerae unicteric, EOMs intact Oropharynx clear and moist No cervical or supraclavicular adenopathy Lungs no rales or rhonchi Heart regular rate and rhythm Abd soft, nontender, positive bowel sounds MSK no focal spinal tenderness, no upper extremity lymphedema Neuro: nonfocal, well oriented, appropriate affect Breasts: The right breast is status post lumpectomy.  There is no evidence of residual or recurrent disease.  The left breast is benign.  Both axillae are benign.  LAB RESULTS:  CMP     Component Value Date/Time   NA 143 06/13/2018 0805   K 4.1 06/13/2018 0805   CL 106 06/13/2018 0805   CO2 26 06/13/2018 0805   GLUCOSE 101 (H) 06/13/2018 0805   BUN 16 06/13/2018 0805   CREATININE 0.66 06/13/2018 0805   CALCIUM 9.9 06/13/2018 0805   PROT 6.5 06/13/2018 0805   ALBUMIN 3.8 06/13/2018 0805   AST 14 (L) 06/13/2018 0805   ALT 21 06/13/2018 0805   ALKPHOS 50 06/13/2018 0805   BILITOT 1.1 06/13/2018 0805   GFRNONAA >60 06/13/2018 0805   GFRAA >60 06/13/2018 0805    No results found for: TOTALPROTELP, ALBUMINELP, A1GS, A2GS, BETS, BETA2SER, GAMS, MSPIKE, SPEI  No results found for: KPAFRELGTCHN, LAMBDASER, KAPLAMBRATIO  Lab Results  Component Value Date   WBC 44.5 (H) 06/13/2018   NEUTROABS PENDING 06/13/2018    HGB 11.0 (L) 06/13/2018   HCT 33.9 (L) 06/13/2018   MCV 101.8 (H) 06/13/2018   PLT 282 06/13/2018    _0 @  No results found for: LABCA2  No components found for: [id number]  No results for input(s): INR in the last 168 hours.  No results found for: LABCA2  No results found for: ELF810  No results found for: FBP102  No results found for: HEN277  No results found for: CA2729  No components found for: HGQUANT  No results found for: CEA1 / No results found for: CEA1   No results found  for: AFPTUMOR  No results found for: CHROMOGRNA  No results found for: PSA1  Appointment on 06/13/2018  Component Date Value Ref Range Status  . Research Labs 06/13/2018 See Scanned report in [facility]   Final   Performed at [facility], [place] [address], [place], [place] [address]  . WBC Count 06/13/2018 44.5* 4.0 - 10.5 K/uL Final  . RBC 06/13/2018 3.33* 3.87 - 5.11 MIL/uL Final  . Hemoglobin 06/13/2018 11.0* 12.0 - 15.0 g/dL Final  . HCT 06/13/2018 33.9* 36.0 - 46.0 % Final  . MCV 06/13/2018 101.8* 80.0 - 100.0 fL Final  . MCH 06/13/2018 33.0  26.0 - 34.0 pg Final  . MCHC 06/13/2018 32.4  30.0 - 36.0 g/dL Final  . RDW 06/13/2018 16.2* 11.5 - 15.5 % Final  . Platelet Count 06/13/2018 282  150 - 400 K/uL Final  . nRBC 06/13/2018 0.0  0.0 - 0.2 % Final   Performed at [facility], [place] [address] [place], [place] [address]  . Neutrophils Relative % 06/13/2018 PENDING  % Incomplete  . Neutro Abs 06/13/2018 PENDING  1.7 - 7.7 K/uL Incomplete  . Band Neutrophils 06/13/2018 PENDING  % Incomplete  . Lymphocytes Relative 06/13/2018 PENDING  % Incomplete  . Lymphs Abs 06/13/2018 PENDING  0.7 - 4.0 K/uL Incomplete  . Monocytes Relative 06/13/2018 PENDING  % Incomplete  . Monocytes Absolute 06/13/2018 PENDING  0.1 - 1.0 K/uL Incomplete  . Eosinophils Relative 06/13/2018 PENDING  % Incomplete  . Eosinophils Absolute  06/13/2018 PENDING  0.0 - 0.5 K/uL Incomplete  . Basophils Relative 06/13/2018 PENDING  % Incomplete  . Basophils Absolute 06/13/2018 PENDING  0.0 - 0.1 K/uL Incomplete  . WBC Morphology 06/13/2018 PENDING   Incomplete  . RBC Morphology 06/13/2018 PENDING   Incomplete  . Smear Review 06/13/2018 PENDING   Incomplete  . Other 06/13/2018 PENDING  % Incomplete  . nRBC 06/13/2018 PENDING  0 /100 WBC Incomplete  . Metamyelocytes Relative 06/13/2018 PENDING  % Incomplete  . Myelocytes 06/13/2018 PENDING  % Incomplete  . Promyelocytes Relative 06/13/2018 PENDING  % Incomplete  . Blasts 06/13/2018 PENDING  % Incomplete  . Sodium 06/13/2018 143  135 - 145 mmol/L Final  . Potassium 06/13/2018 4.1  3.5 - 5.1 mmol/L Final  . Chloride 06/13/2018 106  98 - 111 mmol/L Final  . CO2 06/13/2018 26  22 - 32 mmol/L Final  . Glucose, Bld 06/13/2018 101* 70 - 99 mg/dL Final  . BUN 06/13/2018 16  6 - 20 mg/dL Final  . Creatinine 06/13/2018 0.66  0.44 - 1.00 mg/dL Final  . Calcium 06/13/2018 9.9  8.9 - 10.3 mg/dL Final  . Total Protein 06/13/2018 6.5  6.5 - 8.1 g/dL Final  . Albumin 06/13/2018 3.8  3.5 - 5.0 g/dL Final  . AST 06/13/2018 14* 15 - 41 U/L Final  . ALT 06/13/2018 21  0 - 44 U/L Final  . Alkaline Phosphatase 06/13/2018 50  38 - 126 U/L Final  . Total Bilirubin 06/13/2018 1.1  0.3 - 1.2 mg/dL Final  . GFR, Est Non Af Am 06/13/2018 >60  >60 mL/min Final  . GFR, Est AFR Am 06/13/2018 >60  >60 mL/min Final  . Anion gap 06/13/2018 11  5 - 15 Final   Performed at [facility], [place] [address] [place], [place] [address]    (this displays the last labs from the last 3 days)  No results found for: TOTALPROTELP,  ALBUMINELP, A1GS, A2GS, BETS, BETA2SER, GAMS, MSPIKE, SPEI (this displays SPEP labs)  No results found for: KPAFRELGTCHN, LAMBDASER, KAPLAMBRATIO (kappa/lambda light chains)  No results found for: HGBA, HGBA2QUANT, HGBFQUANT, HGBSQUAN (Hemoglobinopathy evaluation)    No results found for: LDH  No results found for: IRON, TIBC, IRONPCTSAT (Iron and TIBC)  No results found for: FERRITIN  Urinalysis No results found for: COLORURINE, APPEARANCEUR, LABSPEC, PHURINE, GLUCOSEU, HGBUR, BILIRUBINUR, KETONESUR, PROTEINUR, UROBILINOGEN, NITRITE, LEUKOCYTESUR   STUDIES: No results found.  ELIGIBLE FOR AVAILABLE RESEARCH PROTOCOL: BCEP  ASSESSMENT: 47 y.o.  , [place] woman status post right breast upper inner quadrant biopsy 12/29/2017, for a clinical T1c N0, stage IA invasive ductal carcinoma, triple positive, with an MIB-1 of 80%.  (1) genetics testing 02/02/2018 though the CancerNext gene panel offered by [facility] showed no deleterious mutations in  APC, ATM, BARD1, BMPR1A,BRCA1, BRCA2, BRIP1, CDH1, CDK4, CDKN2A, CHEK2, DICER1, HOXB13, MLH1, MRE11A, MSH2, MSH6, MUTYH, NBN, NF1, PALB2, PMS2, POLD1, POLE, PTEN, RAD50, RAD51C, RAD51D, SMAD4, SMARCA4, STK11 and TP53 (sequencing and deletion/duplication); EPCAM and GREM1 (deletion/duplication only).  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>A) )   (2) right lumpectomy and sentinel lymph node sampling 02/16/2018 showed a pT2 pN0, stage IB invasive ductal carcinoma, grade 3, with a positive inferior margin; a total of 5 lymph nodes were removed  (a) additional surgery 02/27/2018 clear the margins  (3) started carboplatin, docetaxel, trastuzumab and Pertuzumab 03/11/2018, to be repeated every 21 days x 6.    (a) Pertuzumab omitted after cycle 1 due to diarrhea.  (b) Gemcitabine substituted for Docetaxel starting with cycle 5 due to peripheral neuropathy  (4) continue trastuzumab to total 6 months (through February 2020).  (a) echocardiogram 01/21/2018 showed an ejection fraction in the 55-60% range  (b) repeat echocardiogram 06/14/2018 (pending).  (5) adjuvant radiation to follow  (6) antiestrogens to start at the completion of local treatment  PLAN:  Vici did  remarkably well with cycle 5 and has excellent counts today.  The really good news is that her peripheral neuropathy is fading.  The pain she was having on the base of her fingernails, which of course is not neuropathy, is also better.  The neuropathy itself now is only at the very tips of her fingers, not at all her toes, and is very minimal.  It does not affect her activities of daily living  She will have her final cycle on 07/18/2017 so that she can have her holidays with her family in [place].  She will then continue the trastuzumab alone through February 2020.  After her last chemotherapy cycle of course she will be referred to adjuvant radiation  I am dropping her Benadryl dose since it made her too sleepy after this cycle.  I also suggested she might want to increase her gabapentin at bedtime if it does not make her groggy in the morning to see if she can get better control of the nighttime hot flashes.  She knows to call for any other issues that may develop before the next visit.  , [name], MD  06/13/18 8:42 AM Medical Oncology and Hematology [facility] [address] [place], [place] [address] Tel. [phone]    Fax. [phone]    Elie Goody, am acting as scribe for Dr. [name]. .  I, Lurline Del MD, have reviewed the above documentation for accuracy and completeness, and I agree with the above.

## 2018-06-13 ENCOUNTER — Ambulatory Visit (HOSPITAL_COMMUNITY)
Admission: RE | Admit: 2018-06-13 | Discharge: 2018-06-13 | Disposition: A | Discharge status: Home / Self Care | Payer: Managed Care, Other (non HMO) | Source: Ambulatory Visit | Attending: Oncology | Admitting: Oncology

## 2018-06-13 ENCOUNTER — Inpatient Hospital Stay (HOSPITAL_BASED_OUTPATIENT_CLINIC_OR_DEPARTMENT_OTHER): Payer: Managed Care, Other (non HMO) | Admitting: Oncology

## 2018-06-13 ENCOUNTER — Telehealth: Payer: Self-pay | Admitting: Oncology

## 2018-06-13 ENCOUNTER — Encounter: Payer: Self-pay | Admitting: Hematology and Oncology

## 2018-06-13 ENCOUNTER — Inpatient Hospital Stay: Payer: Managed Care, Other (non HMO) | Admitting: *Deleted

## 2018-06-13 ENCOUNTER — Inpatient Hospital Stay: Payer: Managed Care, Other (non HMO) | Attending: Oncology

## 2018-06-13 ENCOUNTER — Other Ambulatory Visit: Payer: Managed Care, Other (non HMO)

## 2018-06-13 VITALS — BP 121/82 | HR 61 | Temp 98.7°F | Resp 18 | Ht 67.5 in | Wt 167.7 lb

## 2018-06-13 DIAGNOSIS — N951 Menopausal and female climacteric states: Secondary | ICD-10-CM | POA: Diagnosis not present

## 2018-06-13 DIAGNOSIS — Z170 Estrogen receptor positive status [ER+]: Secondary | ICD-10-CM | POA: Diagnosis not present

## 2018-06-13 DIAGNOSIS — G620 Drug-induced polyneuropathy: Secondary | ICD-10-CM | POA: Diagnosis not present

## 2018-06-13 DIAGNOSIS — T451X5A Adverse effect of antineoplastic and immunosuppressive drugs, initial encounter: Secondary | ICD-10-CM | POA: Insufficient documentation

## 2018-06-13 DIAGNOSIS — C50211 Malignant neoplasm of upper-inner quadrant of right female breast: Secondary | ICD-10-CM

## 2018-06-13 DIAGNOSIS — C50919 Malignant neoplasm of unspecified site of unspecified female breast: Secondary | ICD-10-CM | POA: Insufficient documentation

## 2018-06-13 DIAGNOSIS — R300 Dysuria: Secondary | ICD-10-CM | POA: Insufficient documentation

## 2018-06-13 LAB — CMP (CANCER CENTER ONLY)
ALT: 21 U/L (ref 0–44)
AST: 14 U/L — ABNORMAL LOW (ref 15–41)
Albumin: 3.8 g/dL (ref 3.5–5.0)
Alkaline Phosphatase: 50 U/L (ref 38–126)
Anion gap: 11 (ref 5–15)
BUN: 16 mg/dL (ref 6–20)
CO2: 26 mmol/L (ref 22–32)
Calcium: 9.9 mg/dL (ref 8.9–10.3)
Chloride: 106 mmol/L (ref 98–111)
Creatinine: 0.66 mg/dL (ref 0.44–1.00)
GFR, Est AFR Am: >60 mL/min (ref >60)
GFR, Estimated: >60 mL/min (ref >60)
Glucose, Bld: 101 mg/dL — ABNORMAL HIGH (ref 70–99)
Potassium: 4.1 mmol/L (ref 3.5–5.1)
Sodium: 143 mmol/L (ref 135–145)
Total Bilirubin: 1.1 mg/dL (ref 0.3–1.2)
Total Protein: 6.5 g/dL (ref 6.5–8.1)

## 2018-06-13 LAB — CBC WITH DIFFERENTIAL (CANCER CENTER ONLY)
Abs Immature Granulocytes: 1.99 10*3/uL — ABNORMAL HIGH (ref 0.00–0.07)
Basophils Absolute: 0.1 10*3/uL (ref 0.0–0.1)
Basophils Relative: 0 %
Eosinophils Absolute: 0 10*3/uL (ref 0.0–0.5)
Eosinophils Relative: 0 %
HCT: 33.9 % — ABNORMAL LOW (ref 36.0–46.0)
Hemoglobin: 11 g/dL — ABNORMAL LOW (ref 12.0–15.0)
Immature Granulocytes: 5 %
Lymphocytes Relative: 1 %
Lymphs Abs: 0.6 10*3/uL — ABNORMAL LOW (ref 0.7–4.0)
MCH: 33 pg (ref 26.0–34.0)
MCHC: 32.4 g/dL (ref 30.0–36.0)
MCV: 101.8 fL — ABNORMAL HIGH (ref 80.0–100.0)
Monocytes Absolute: 0.9 10*3/uL (ref 0.1–1.0)
Monocytes Relative: 2 %
Neutro Abs: 40.9 10*3/uL — ABNORMAL HIGH (ref 1.7–7.7)
Neutrophils Relative %: 92 %
Platelet Count: 282 10*3/uL (ref 150–400)
RBC: 3.33 MIL/uL — ABNORMAL LOW (ref 3.87–5.11)
RDW: 16.2 % — ABNORMAL HIGH (ref 11.5–15.5)
WBC Count: 44.5 10*3/uL — ABNORMAL HIGH (ref 4.0–10.5)
nRBC: 0 % (ref 0.0–0.2)

## 2018-06-13 LAB — RESEARCH LABS

## 2018-06-13 NOTE — Telephone Encounter (Signed)
Per 1/22 no los °

## 2018-06-14 ENCOUNTER — Other Ambulatory Visit: Payer: Self-pay

## 2018-06-14 ENCOUNTER — Inpatient Hospital Stay (HOSPITAL_COMMUNITY): Admission: RE | Admit: 2018-06-14 | Payer: Managed Care, Other (non HMO) | Source: Ambulatory Visit

## 2018-06-14 ENCOUNTER — Encounter (HOSPITAL_COMMUNITY): Payer: Self-pay | Admitting: Cardiology

## 2018-06-14 ENCOUNTER — Other Ambulatory Visit: Payer: Managed Care, Other (non HMO)

## 2018-06-14 ENCOUNTER — Ambulatory Visit (HOSPITAL_COMMUNITY)
Admission: RE | Admit: 2018-06-14 | Discharge: 2018-06-14 | Disposition: A | Discharge status: Home / Self Care | Payer: Managed Care, Other (non HMO) | Source: Ambulatory Visit | Attending: Cardiology | Admitting: Cardiology

## 2018-06-14 ENCOUNTER — Ambulatory Visit (HOSPITAL_BASED_OUTPATIENT_CLINIC_OR_DEPARTMENT_OTHER)
Admission: RE | Admit: 2018-06-14 | Discharge: 2018-06-14 | Disposition: A | Discharge status: Home / Self Care | Payer: Managed Care, Other (non HMO) | Source: Ambulatory Visit | Attending: Family Medicine | Admitting: Family Medicine

## 2018-06-14 ENCOUNTER — Encounter: Payer: Self-pay | Admitting: *Deleted

## 2018-06-14 ENCOUNTER — Encounter: Payer: Managed Care, Other (non HMO) | Admitting: *Deleted

## 2018-06-14 ENCOUNTER — Other Ambulatory Visit (HOSPITAL_COMMUNITY): Payer: Managed Care, Other (non HMO)

## 2018-06-14 VITALS — BP 122/80 | HR 55 | Wt 168.6 lb

## 2018-06-14 DIAGNOSIS — C50211 Malignant neoplasm of upper-inner quadrant of right female breast: Secondary | ICD-10-CM

## 2018-06-14 DIAGNOSIS — M1990 Unspecified osteoarthritis, unspecified site: Secondary | ICD-10-CM | POA: Diagnosis not present

## 2018-06-14 DIAGNOSIS — Z803 Family history of malignant neoplasm of breast: Secondary | ICD-10-CM

## 2018-06-14 DIAGNOSIS — Z170 Estrogen receptor positive status [ER+]: Secondary | ICD-10-CM

## 2018-06-14 DIAGNOSIS — C50911 Malignant neoplasm of unspecified site of right female breast: Secondary | ICD-10-CM | POA: Insufficient documentation

## 2018-06-14 NOTE — Progress Notes (Signed)
Oncology: Dr. Jana Hakim  47 yo with breast cancer was referred by Dr. Jana Hakim for cardio-oncology evaluation.  Right breast cancer was diagnosed in 6/19, ER+/PR+/HER2+.  She had lumpectomy in 8/19 and has been getting Paclitaxel + Herceptin x 12 cycles then Herceptin alone to complete 6 months.   No complaints today.  No exertional dyspnea or chest pain.    PMH: 1. Arthritis 2. Breast cancer: Right breast cancer was diagnosed in 6/19, ER+/PR+/HER2+.  She had lumpectomy 8/19.  Now getting Paclitaxel + Herceptin x 12 cycles to be followed by Herceptin alone to complete 6 months.  - Echo (7/19): EF 55-60%, GLS -23%, RV moderately dilated with normal systolic function.  - Echo (12/19): EF 60-65%, GLS -23.5%, mild MR  Social History   Socioeconomic History  . Marital status: Legally Separated    Spouse name: Not on file  . Number of children: Not on file  . Years of education: Not on file  . Highest education level: Not on file  Occupational History  . Not on file  Social Needs  . Financial resource strain: Not on file  . Food insecurity:    Worry: Not on file    Inability: Not on file  . Transportation needs:    Medical: Not on file    Non-medical: Not on file  Tobacco Use  . Smoking status: Never Smoker  . Smokeless tobacco: Never Used  Substance and Sexual Activity  . Alcohol use: Yes    Comment: occ  . Drug use: No  . Sexual activity: Not Currently    Birth control/protection: Surgical  Lifestyle  . Physical activity:    Days per week: Not on file    Minutes per session: Not on file  . Stress: Not on file  Relationships  . Social connections:    Talks on phone: Not on file    Gets together: Not on file    Attends religious service: Not on file    Active member of club or organization: Not on file    Attends meetings of clubs or organizations: Not on file    Relationship status: Not on file  . Intimate partner violence:    Fear of current or ex partner: Not on file     Emotionally abused: Not on file    Physically abused: Not on file    Forced sexual activity: Not on file  Other Topics Concern  . Not on file  Social History Narrative  . Not on file   Family History  Problem Relation Age of Onset  . Lung cancer Maternal Grandfather   . Esophageal cancer Paternal Grandfather 74  . Breast cancer Cousin 31   ROS: All systems reviewed and negative except as per HPI.  Current Outpatient Medications  Medication Sig Dispense Refill  . Acetaminophen (TYLENOL PO) Take by mouth 3 (three) times daily. 2 tabs as needed for aches and pains    . dexamethasone (DECADRON) 4 MG tablet Take 2 tablets (8 mg total) by mouth 2 (two) times daily. Start the day before Taxotere. Take once the day after, then 2 times a day x 2d. 30 tablet 1  . fluconazole (DIFLUCAN) 100 MG tablet Take 1 tablet (100 mg total) by mouth daily. 20 tablet 1  . gabapentin (NEURONTIN) 300 MG capsule Take 1 capsule (300 mg total) by mouth at bedtime. 30 capsule 2  . ibuprofen (ADVIL,MOTRIN) 800 MG tablet Take 1 tablet (800 mg total) by mouth every 8 (eight) hours as  needed. 30 tablet 0  . lidocaine-prilocaine (EMLA) cream Apply to affected area once 30 g 3  . loratadine (CLARITIN) 10 MG tablet Take 10 mg by mouth daily.    Marland Kitchen LORazepam (ATIVAN) 0.5 MG tablet TAKE 1 TABLET (0.5 MG TOTAL) BY MOUTH AT BEDTIME AS NEEDED (NAUSEA OR VOMITING). 30 tablet 0  . magic mouthwash w/lidocaine SOLN Take 5 mLs by mouth 4 (four) times daily as needed for mouth pain. Swish, Swallow, or spit. 240 mL 1  . NAPROXEN PO Take by mouth as needed. 1 tab as needed for aches and pain    . nystatin (MYCOSTATIN) 100000 UNIT/ML suspension Take 5 mLs (500,000 Units total) by mouth 4 (four) times daily. 60 mL 0  . ondansetron (ZOFRAN) 8 MG tablet Take 1 tablet (8 mg total) by mouth every 8 (eight) hours as needed for nausea or vomiting. 20 tablet 0  . Probiotic Product (PROBIOTIC PO) Take by mouth daily.    . prochlorperazine  (COMPAZINE) 10 MG tablet Take 1 tablet (10 mg total) by mouth every 6 (six) hours as needed (Nausea or vomiting). 30 tablet 1  . cholestyramine (QUESTRAN) 4 g packet Take 1 packet (4 g total) by mouth 2 (two) times daily. (Patient not taking: Reported on 06/14/2018) 180 packet 1  . oxyCODONE (OXY IR/ROXICODONE) 5 MG immediate release tablet Take 1 tablet (5 mg total) by mouth every 6 (six) hours as needed for severe pain. (Patient not taking: Reported on 06/14/2018) 15 tablet 0   No current facility-administered medications for this encounter.    BP 122/80   Pulse (!) 55   Wt 76.5 kg (168 lb 9.6 oz)   LMP 12/21/2016 (Exact Date)   SpO2 98%   BMI 26.02 kg/m  General: NAD Neck: No JVD, no thyromegaly or thyroid nodule.  Lungs: Clear to auscultation bilaterally with normal respiratory effort. CV: Nondisplaced PMI.  Heart regular S1/S2, no S3/S4, no murmur.  No peripheral edema.  No carotid bruit.  Normal pedal pulses.  Abdomen: Soft, nontender, no hepatosplenomegaly, no distention.  Skin: Intact without lesions or rashes.  Neurologic: Alert and oriented x 3.  Psych: Normal affect. Extremities: No clubbing or cyanosis.  HEENT: Normal.   Assessment/Plan:  Patient has breast cancer with plan for treatment that will include 6 months of Herceptin.  I reviewed today's echo, EF and strain parameters were stable.  She will complete Herceptin in 2/20.  - She will return for followup and echo in 3 months.  If echo remains normal, this will be her last study.   Loralie Champagne 06/14/2018

## 2018-06-14 NOTE — Progress Notes (Signed)
  Echocardiogram 2D Echocardiogram has been performed.  Monica [name] 06/14/2018, 8:56 AM

## 2018-06-14 NOTE — Patient Instructions (Signed)
Follow up 3 months with an ECHO.

## 2018-06-20 ENCOUNTER — Inpatient Hospital Stay: Payer: Managed Care, Other (non HMO)

## 2018-06-20 ENCOUNTER — Inpatient Hospital Stay (HOSPITAL_BASED_OUTPATIENT_CLINIC_OR_DEPARTMENT_OTHER): Payer: Managed Care, Other (non HMO) | Admitting: Adult Health

## 2018-06-20 ENCOUNTER — Encounter: Payer: Self-pay | Admitting: Adult Health

## 2018-06-20 VITALS — BP 121/86 | HR 69 | Temp 98.5°F | Resp 18 | Ht 67.5 in | Wt 169.6 lb

## 2018-06-20 DIAGNOSIS — Z170 Estrogen receptor positive status [ER+]: Secondary | ICD-10-CM

## 2018-06-20 DIAGNOSIS — R300 Dysuria: Secondary | ICD-10-CM

## 2018-06-20 DIAGNOSIS — C50211 Malignant neoplasm of upper-inner quadrant of right female breast: Secondary | ICD-10-CM | POA: Diagnosis not present

## 2018-06-20 LAB — CBC WITH DIFFERENTIAL (CANCER CENTER ONLY)
Abs Immature Granulocytes: 0.86 10*3/uL — ABNORMAL HIGH (ref 0.00–0.07)
Basophils Absolute: 0.1 10*3/uL (ref 0.0–0.1)
Basophils Relative: 0 %
Eosinophils Absolute: 0.1 10*3/uL (ref 0.0–0.5)
Eosinophils Relative: 0 %
HCT: 34.4 % — ABNORMAL LOW (ref 36.0–46.0)
Hemoglobin: 11.1 g/dL — ABNORMAL LOW (ref 12.0–15.0)
Immature Granulocytes: 4 %
Lymphocytes Relative: 9 %
Lymphs Abs: 2 10*3/uL (ref 0.7–4.0)
MCH: 33 pg (ref 26.0–34.0)
MCHC: 32.3 g/dL (ref 30.0–36.0)
MCV: 102.4 fL — ABNORMAL HIGH (ref 80.0–100.0)
Monocytes Absolute: 1.3 10*3/uL — ABNORMAL HIGH (ref 0.1–1.0)
Monocytes Relative: 6 %
Neutro Abs: 17.7 10*3/uL — ABNORMAL HIGH (ref 1.7–7.7)
Neutrophils Relative %: 81 %
Platelet Count: 183 10*3/uL (ref 150–400)
RBC: 3.36 MIL/uL — ABNORMAL LOW (ref 3.87–5.11)
RDW: 15.4 % (ref 11.5–15.5)
WBC Count: 21.9 10*3/uL — ABNORMAL HIGH (ref 4.0–10.5)
nRBC: 0 % (ref 0.0–0.2)

## 2018-06-20 LAB — CMP (CANCER CENTER ONLY)
ALT: 39 U/L (ref 0–44)
AST: 29 U/L (ref 15–41)
Albumin: 4 g/dL (ref 3.5–5.0)
Alkaline Phosphatase: 120 U/L (ref 38–126)
Anion gap: 9 (ref 5–15)
BUN: 14 mg/dL (ref 6–20)
CO2: 27 mmol/L (ref 22–32)
Calcium: 9.8 mg/dL (ref 8.9–10.3)
Chloride: 105 mmol/L (ref 98–111)
Creatinine: 0.76 mg/dL (ref 0.44–1.00)
GFR, Est AFR Am: >60 mL/min (ref >60)
GFR, Estimated: >60 mL/min (ref >60)
Glucose, Bld: 97 mg/dL (ref 70–99)
Potassium: 4.2 mmol/L (ref 3.5–5.1)
Sodium: 141 mmol/L (ref 135–145)
Total Bilirubin: 0.3 mg/dL (ref 0.3–1.2)
Total Protein: 7 g/dL (ref 6.5–8.1)

## 2018-06-20 NOTE — Progress Notes (Signed)
[place]  [phone] [phone]     ID: Monica [name] DOB: 25-Sep-1970  MR#: [id number]  MEQ#:[id number]  Patient Care Team: [name], PA-C as PCP - General (Family Medicine) [name], MD as Consulting Physician (General Surgery) Magrinat, [name], MD as Consulting Physician (Oncology) [name], MD as Consulting Physician (Radiation Oncology) [name], MD as Consulting Physician (Obstetrics and Gynecology) OTHER MD: [facility]  CHIEF COMPLAINT: triple positive breast cancer  CURRENT TREATMENT: adjuvant chemo-immunotherapy  HISTORY OF CURRENT ILLNESS: From the original intake note:  Monica [name] (pronounced "uric") palpated a right supraclavicular mass and felt tenderness after stretching one day. She brought this to her PA's attention.  The patient underwent bilateral diagnostic mammography with tomography and right breast ultrasonography at [facility] on 12/20/2017 showing: breast density category C. There was a suspicious palpable mass at the 1 o'clock position upper inner quadrant measuring 1.4 x 1.3 x 1.3 cm located 12 cm from the nipple. There was an additional mass at the 8 o'clock radiant measuring 1.2 x 1.0 x 1.4 cm, which likely represented a cyst. Ultrasound evaluation of the right axilla demonstrates no evidence of lymphadenopathy  Accordingly on 12/29/2017 she proceeded to biopsy of the right breast area in question. The pathology from this procedure showed ([id number]): Invasive ductal carcinoma, grade III. Prognostic indicators significant for: estrogen receptor, 80% positive and progesterone receptor, 70% positive, both with strong staining intensity. Proliferation marker Ki67 at 80%. HER2 amplified with ratios HER2/CEP17 signals 3.44 and average HER2 copies per cell 10.65.  The patient's subsequent history is as detailed below.  INTERVAL HISTORY: Monica [name] returns today for follow-up  of her triple positive breast cancer.  She is here for evaluation after receivng her fifth cycle of adjuvant chemotherapy.  She is cycle 5 day 8 of Gemcitabine, Carboplatin, and Trastuzumab.  She receives growth factor support with OnPro and tolerates it well.  She says that this week has been the best week.    REVIEW OF SYSTEMS: Monica [name] continues to feel well today.  She doesn't note any difficulty with the treatment.  She is using ice chips for her mouth and this is helping.  She was having urinary symptoms over the weekend, consistent with a UTI.  She had dysuria, urinary pressure, urinary hesitancy.  She started Keflex.  She had taken this for skin issues, and started the keflex.  She is tolerating the medication well and denies any symptoms today. A detailed ROS was otherwise non contributory today.  PAST MEDICAL HISTORY: Past Medical History:  Diagnosis Date  . Arthritis    lower back  . Cancer [facility])    right breast cancer  . Family history of breast cancer   She notes that she had a heart murmur as a child, which she grew out of.   PAST SURGICAL HISTORY: Past Surgical History:  Procedure Laterality Date  . ABDOMINAL HYSTERECTOMY    . BREAST LUMPECTOMY WITH RADIOACTIVE SEED AND SENTINEL LYMPH NODE BIOPSY Right 02/16/2018   Procedure: RIGHT BREAST LUMPECTOMY WITH RADIOACTIVE SEED AND SENTINEL LYMPH NODE BIOPSY;  Surgeon: [name], MD;  Location: [place];  Service: General;  Laterality: Right;  . HYMENECTOMY    . PORTACATH PLACEMENT Right 02/16/2018   Procedure: INSERTION PORT-A-CATH;  Surgeon: [name], MD;  Location: [place];  Service: General;  Laterality: Right;  . RE-EXCISION OF BREAST LUMPECTOMY Right 02/22/2018   Procedure: RE-EXCISION OF RIGHT  BREAST LUMPECTOMY;  Surgeon: [name], MD;  Location: [place];  Service: General;  Laterality: Right;  . REPAIR VAGINAL CUFF N/A 02/07/2017   Procedure: REPAIR VAGINAL  CUFF;  Surgeon: Ena Dawley, MD;  Location: [facility];  Service: Gynecology;  Laterality: N/A;  . ROBOTIC ASSISTED TOTAL HYSTERECTOMY WITH SALPINGECTOMY Left 01/20/2017   Procedure: ROBOTIC ASSISTED TOTAL HYSTERECTOMY WITH SALPINGECTOMY;  Surgeon: [name], MD;  Location: [facility];  Service: Gynecology;  Laterality: Left;  Partial Hysterectomy without BSO  FAMILY HISTORY Family History  Problem Relation Age of Onset  . Lung cancer Maternal Grandfather   . Esophageal cancer Paternal Grandfather 71  . Breast cancer Cousin 81  As of July 2019, the patient's father is alive at 35. The patient's mother is also alive at 75. The patient has 1 brother and 5 sisters. There was a maternal grandfather diagnosed with lung cancer at 2. There was a paternal grandfather diagnosed with esophageal cancer at 40. The patient's father had a pre-cancerous esophageal finding at age 35. There was also a maternal 1st cousin diagnosed with metastatic breast cancer at age 66.    GYNECOLOGIC HISTORY:  Patient's last menstrual period was 12/21/2016 (exact date). Menarche: 47 years old Age at first live birth: 47 years old She is GXP2. She is status post partial hysterectomy without BSO in 2018 She never used HRT. She used oral contraception over 21 years ago with no complications.   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 The patient is separated from her husband, Monica [name]. The patient's son Monica [name] age 29 is in the [employer] stationed in [place] in the Horticulturist, commercial. He plans on working in the railroad industry after he returns. The patient's daughter Monica [name] age 82, works at [employer].      ADVANCED DIRECTIVES: Not in place   HEALTH MAINTENANCE: Social History   Tobacco Use  . Smoking status: Never Smoker  . Smokeless tobacco: Never Used  Substance Use Topics  . Alcohol use: Yes    Comment: occ  . Drug use: No     Colonoscopy:   PAP: 2018/  prior to hysterectomy  Bone density:   No Known Allergies  Current Outpatient Medications  Medication Sig Dispense Refill  . Acetaminophen (TYLENOL PO) Take by mouth 3 (three) times daily. 2 tabs as needed for aches and pains    . cholestyramine (QUESTRAN) 4 g packet Take 1 packet (4 g total) by mouth 2 (two) times daily. 180 packet 1  . dexamethasone (DECADRON) 4 MG tablet Take 2 tablets (8 mg total) by mouth 2 (two) times daily. Start the day before Taxotere. Take once the day after, then 2 times a day x 2d. 30 tablet 1  . fluconazole (DIFLUCAN) 100 MG tablet Take 1 tablet (100 mg total) by mouth daily. 20 tablet 1  . gabapentin (NEURONTIN) 300 MG capsule Take 1 capsule (300 mg total) by mouth at bedtime. 30 capsule 2  . ibuprofen (ADVIL,MOTRIN) 800 MG tablet Take 1 tablet (800 mg total) by mouth every 8 (eight) hours as needed. 30 tablet 0  . lidocaine-prilocaine (EMLA) cream Apply to affected area once 30 g 3  . loratadine (CLARITIN) 10 MG tablet Take 10 mg by mouth daily.    Marland Kitchen LORazepam (ATIVAN) 0.5 MG tablet TAKE 1 TABLET (0.5 MG TOTAL) BY MOUTH AT BEDTIME AS NEEDED (NAUSEA OR VOMITING). 30 tablet 0  . magic mouthwash w/lidocaine SOLN Take 5 mLs by mouth 4 (  four) times daily as needed for mouth pain. Swish, Swallow, or spit. 240 mL 1  . NAPROXEN PO Take by mouth as needed. 1 tab as needed for aches and pain    . nystatin (MYCOSTATIN) 100000 UNIT/ML suspension Take 5 mLs (500,000 Units total) by mouth 4 (four) times daily. 60 mL 0  . ondansetron (ZOFRAN) 8 MG tablet Take 1 tablet (8 mg total) by mouth every 8 (eight) hours as needed for nausea or vomiting. 20 tablet 0  . oxyCODONE (OXY IR/ROXICODONE) 5 MG immediate release tablet Take 1 tablet (5 mg total) by mouth every 6 (six) hours as needed for severe pain. 15 tablet 0  . Probiotic Product (PROBIOTIC PO) Take by mouth daily.    . prochlorperazine (COMPAZINE) 10 MG tablet Take 1 tablet (10 mg total) by mouth every 6 (six) hours as  needed (Nausea or vomiting). 30 tablet 1   No current facility-administered medications for this visit.     OBJECTIVE:  Vitals:   06/20/18 1319  BP: 121/86  Pulse: 69  Resp: 18  Temp: 98.5 F (36.9 C)  SpO2: 100%     Body mass index is 26.17 kg/m.   Wt Readings from Last 3 Encounters:  06/20/18 169 lb 9.6 oz (76.9 kg)  06/14/18 168 lb 9.6 oz (76.5 kg)  06/13/18 167 lb 11.2 oz (76.1 kg)  ECOG FS:1 GENERAL: Patient is a well appearing female in no acute distress HEENT:  Sclerae anicteric.  Oropharynx clear and moist. No ulcerations or evidence of oropharyngeal candidiasis. Neck is supple.  NODES:  No cervical, supraclavicular, or axillary lymphadenopathy palpated.  BREAST EXAM:  Deferred. LUNGS:  Clear to auscultation bilaterally.  No wheezes or rhonchi. HEART:  Regular rate and rhythm. No murmur appreciated. ABDOMEN:  Soft, nontender.  Positive, normoactive bowel sounds. No organomegaly palpated. MSK:  No focal spinal tenderness to palpation. Full range of motion bilaterally in the upper extremities. EXTREMITIES:  No peripheral edema.   SKIN:  Clear with no obvious rashes or skin changes. No nail dyscrasia. NEURO:  Nonfocal. Well oriented.  Appropriate affect.    LAB RESULTS:  CMP     Component Value Date/Time   NA 141 06/20/2018 1209   K 4.2 06/20/2018 1209   CL 105 06/20/2018 1209   CO2 27 06/20/2018 1209   GLUCOSE 97 06/20/2018 1209   BUN 14 06/20/2018 1209   CREATININE 0.76 06/20/2018 1209   CALCIUM 9.8 06/20/2018 1209   PROT 7.0 06/20/2018 1209   ALBUMIN 4.0 06/20/2018 1209   AST 29 06/20/2018 1209   ALT 39 06/20/2018 1209   ALKPHOS 120 06/20/2018 1209   BILITOT 0.3 06/20/2018 1209   GFRNONAA >60 06/20/2018 1209   GFRAA >60 06/20/2018 1209    No results found for: TOTALPROTELP, ALBUMINELP, A1GS, A2GS, BETS, BETA2SER, GAMS, MSPIKE, SPEI  No results found for: KPAFRELGTCHN, LAMBDASER, KAPLAMBRATIO  Lab Results  Component Value Date   WBC 21.9 (H)  06/20/2018   NEUTROABS 17.7 (H) 06/20/2018   HGB 11.1 (L) 06/20/2018   HCT 34.4 (L) 06/20/2018   MCV 102.4 (H) 06/20/2018   PLT 183 06/20/2018    _0 @  No results found for: LABCA2  No components found for: EMVVKP224  No results for input(s): INR in the last 168 hours.  No results found for: LABCA2  No results found for: SLP530  No results found for: YFR102  No results found for: TRZ735  No results found for: CA2729  No components found for: HGQUANT  No results found for: CEA1 / No results found for: CEA1   No results found for: AFPTUMOR  No results found for: CHROMOGRNA  No results found for: PSA1  Appointment on 06/20/2018  Component Date Value Ref Range Status  . Sodium 06/20/2018 141  135 - 145 mmol/L Final  . Potassium 06/20/2018 4.2  3.5 - 5.1 mmol/L Final  . Chloride 06/20/2018 105  98 - 111 mmol/L Final  . CO2 06/20/2018 27  22 - 32 mmol/L Final  . Glucose, Bld 06/20/2018 97  70 - 99 mg/dL Final  . BUN 06/20/2018 14  6 - 20 mg/dL Final  . Creatinine 06/20/2018 0.76  0.44 - 1.00 mg/dL Final  . Calcium 06/20/2018 9.8  8.9 - 10.3 mg/dL Final  . Total Protein 06/20/2018 7.0  6.5 - 8.1 g/dL Final  . Albumin 06/20/2018 4.0  3.5 - 5.0 g/dL Final  . AST 06/20/2018 29  15 - 41 U/L Final  . ALT 06/20/2018 39  0 - 44 U/L Final  . Alkaline Phosphatase 06/20/2018 120  38 - 126 U/L Final  . Total Bilirubin 06/20/2018 0.3  0.3 - 1.2 mg/dL Final  . GFR, Est Non Af Am 06/20/2018 >60  >60 mL/min Final  . GFR, Est AFR Am 06/20/2018 >60  >60 mL/min Final  . Anion gap 06/20/2018 9  5 - 15 Final   Performed at [facility], [place] [address], [place], [place] [address]  . WBC Count 06/20/2018 21.9* 4.0 - 10.5 K/uL Final  . RBC 06/20/2018 3.36* 3.87 - 5.11 MIL/uL Final  . Hemoglobin 06/20/2018 11.1* 12.0 - 15.0 g/dL Final  . HCT 06/20/2018 34.4* 36.0 - 46.0 % Final  . MCV 06/20/2018 102.4* 80.0 - 100.0 fL Final  . MCH 06/20/2018 33.0   26.0 - 34.0 pg Final  . MCHC 06/20/2018 32.3  30.0 - 36.0 g/dL Final  . RDW 06/20/2018 15.4  11.5 - 15.5 % Final  . Platelet Count 06/20/2018 183  150 - 400 K/uL Final  . nRBC 06/20/2018 0.0  0.0 - 0.2 % Final  . Neutrophils Relative % 06/20/2018 81  % Final  . Neutro Abs 06/20/2018 17.7* 1.7 - 7.7 K/uL Final  . Lymphocytes Relative 06/20/2018 9  % Final  . Lymphs Abs 06/20/2018 2.0  0.7 - 4.0 K/uL Final  . Monocytes Relative 06/20/2018 6  % Final  . Monocytes Absolute 06/20/2018 1.3* 0.1 - 1.0 K/uL Final  . Eosinophils Relative 06/20/2018 0  % Final  . Eosinophils Absolute 06/20/2018 0.1  0.0 - 0.5 K/uL Final  . Basophils Relative 06/20/2018 0  % Final  . Basophils Absolute 06/20/2018 0.1  0.0 - 0.1 K/uL Final  . Immature Granulocytes 06/20/2018 4  % Final  . Abs Immature Granulocytes 06/20/2018 0.86* 0.00 - 0.07 K/uL Final   Performed at [facility], [place] [address], [place], [place] [address]    (this displays the last labs from the last 3 days)  No results found for: TOTALPROTELP, ALBUMINELP, A1GS, A2GS, BETS, BETA2SER, GAMS, MSPIKE, SPEI (this displays SPEP labs)  No results found for: KPAFRELGTCHN, LAMBDASER, KAPLAMBRATIO (kappa/lambda light chains)  No results found for: HGBA, HGBA2QUANT, HGBFQUANT, HGBSQUAN (Hemoglobinopathy evaluation)   No results found for: LDH  No results found for: IRON, TIBC, IRONPCTSAT (Iron and TIBC)  No results found for: FERRITIN  Urinalysis No results found for: COLORURINE, APPEARANCEUR, LABSPEC, PHURINE, GLUCOSEU, HGBUR, BILIRUBINUR, KETONESUR, PROTEINUR, UROBILINOGEN, NITRITE, LEUKOCYTESUR   STUDIES: No results  found.  ELIGIBLE FOR AVAILABLE RESEARCH PROTOCOL: BCEP  ASSESSMENT: 47 y.o.  [place], [place] woman status post right breast upper inner quadrant biopsy 12/29/2017, for a clinical T1c N0, stage IA invasive ductal carcinoma, triple positive, with an MIB-1 of 80%.  (1) genetics testing 02/02/2018 though  the CancerNext gene panel offered by [facility] showed no deleterious mutations in  APC, ATM, BARD1, BMPR1A,BRCA1, BRCA2, BRIP1, CDH1, CDK4, CDKN2A, CHEK2, DICER1, HOXB13, MLH1, MRE11A, MSH2, MSH6, MUTYH, NBN, NF1, PALB2, PMS2, POLD1, POLE, PTEN, RAD50, RAD51C, RAD51D, SMAD4, SMARCA4, STK11 and TP53 (sequencing and deletion/duplication); EPCAM and GREM1 (deletion/duplication only).  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>A) )   (2) right lumpectomy and sentinel lymph node sampling 02/16/2018 showed a pT2 pN0, stage IB invasive ductal carcinoma, grade 3, with a positive inferior margin; a total of 5 lymph nodes were removed  (a) additional surgery 02/27/2018 clear the margins  (3) started carboplatin, docetaxel, trastuzumab and Pertuzumab 03/11/2018, to be repeated every 21 days x 6.    (a) Pertuzumab omitted after cycle 1 due to diarrhea.  (b) Gemcitabine substituted for Docetaxel starting with cycle 5 due to peripheral neuropathy  (4) continue trastuzumab to total 6 months (through February 2020).  (a) echocardiogram 01/21/2018 showed an ejection fraction in the 55-60% range  (b) repeat echocardiogram 06/14/2018 (pending).  (5) adjuvant radiation to follow  (6) antiestrogens to start at the completion of local treatment  PLAN:  Neliah is doing well today.  She tolerated chemotherapy well again.  Her labs are stable, and I reviewed them with her in detail.  She has not had any further mucositis, and is very pleased about this.    We reviewed the issue of her urinary symptoms.  These have cleared up with her starting Keflex.  She will continue this.  I gave her a cup to bring her urine in for testing if her symptoms return.  She knows to call if this happens.    Yemariam and I reviewed her upcoming appointments.  She will return on 1/6 for labs, f/u with Dr. Jana Hakim, and her next treatment.  She knows to call for any other issues that may develop before the  next visit.  A total of (20) minutes of face-to-face time was spent with this patient with greater than 50% of that time in counseling and care-coordination.   Wilber Bihari, NP 06/20/18 1:33 PM Medical Oncology and Hematology [facility] [address] [place], [place] [address] Tel. [phone]    Fax. [phone]

## 2018-06-21 ENCOUNTER — Telehealth: Payer: Self-pay | Admitting: Adult Health

## 2018-06-21 NOTE — Telephone Encounter (Signed)
Per 1/29 no los 

## 2018-06-24 ENCOUNTER — Ambulatory Visit: Payer: Managed Care, Other (non HMO)

## 2018-06-24 ENCOUNTER — Ambulatory Visit: Payer: Managed Care, Other (non HMO) | Admitting: Adult Health

## 2018-06-24 ENCOUNTER — Other Ambulatory Visit: Payer: Managed Care, Other (non HMO)

## 2018-06-28 ENCOUNTER — Other Ambulatory Visit: Payer: Self-pay | Admitting: *Deleted

## 2018-06-28 DIAGNOSIS — C50211 Malignant neoplasm of upper-inner quadrant of right female breast: Secondary | ICD-10-CM

## 2018-06-28 DIAGNOSIS — Z170 Estrogen receptor positive status [ER+]: Secondary | ICD-10-CM

## 2018-06-28 MED ORDER — GABAPENTIN 300 MG PO CAPS: 300.0000 mg | ORAL_CAPSULE | Freq: Every day | ORAL | 2 refills | Status: DC

## 2018-07-01 ENCOUNTER — Other Ambulatory Visit: Payer: Managed Care, Other (non HMO)

## 2018-07-01 ENCOUNTER — Ambulatory Visit: Payer: Managed Care, Other (non HMO) | Admitting: Oncology

## 2018-07-17 NOTE — Progress Notes (Signed)
[place]  Telephone:(336) [phone] Fax:(336) [phone]     ID: [name] DOB: [birth year]  MR#: [id number]  JJH#:[id number]  Patient Care Team: [name], PA-C as PCP - General (Family Medicine) [name], MD as Consulting Physician (General Surgery) Kiven Vangilder, [name], MD as Consulting Physician (Oncology) [name], MD as Consulting Physician (Radiation Oncology) [name], MD as Consulting Physician (Obstetrics and Gynecology) OTHER MD: [facility]  CHIEF COMPLAINT: triple positive breast cancer  CURRENT TREATMENT: Completing adjuvant chemo-immunotherapy, continuing trastuzumab  HISTORY OF CURRENT ILLNESS: From the original intake note:  Monica [name] (pronounced "uric") palpated a right supraclavicular mass and felt tenderness after stretching one day. She brought this to her PA's attention.  The patient underwent bilateral diagnostic mammography with tomography and right breast ultrasonography at [facility] on 12/20/2017 showing: breast density category C. There was a suspicious palpable mass at the 1 o'clock position upper inner quadrant measuring 1.4 x 1.3 x 1.3 cm located 12 cm from the nipple. There was an additional mass at the 8 o'clock radiant measuring 1.2 x 1.0 x 1.4 cm, which likely represented a cyst. Ultrasound evaluation of the right axilla demonstrates no evidence of lymphadenopathy  Accordingly on 12/29/2017 she proceeded to biopsy of the right breast area in question. The pathology from this procedure showed ([id number]): Invasive ductal carcinoma, grade III. Prognostic indicators significant for: estrogen receptor, 80% positive and progesterone receptor, 70% positive, both with strong staining intensity. Proliferation marker Ki67 at 80%. HER2 amplified with ratios HER2/CEP17 signals 3.44 and average HER2 copies per cell 10.65.  The patient's subsequent history is as detailed below.  INTERVAL HISTORY: Matalynn  returns today for follow-up and treatment of of her triple positive breast cancer.   The patient continues on trastuzumab.  She tolerates this with no side effects that she is aware of  Her most recent echocardiogram was 06/14/2018.  It showed an ejection fraction of 60-65%  She also continues on adjuvant chemotherapy, consisting currently of carboplatin and gemcitabine, repeated every 21 days x 6. Today is day 1 cycle 6.   Her nails are beginning to separate, but she tries to keep them clipped short so she doesn't snag them on anything. She had no nausea or vomiting with treatment.  Since her last visit here on 06/20/2018, she has not undergone any studies.    REVIEW OF SYSTEMS: For the holidays, Sherell drove to [place] and saw her family. Despite chemotherapy, her appetite and her taste has been great. She is still having  hot flashes, mostly at night with little relief from her prescribed medication, so she has just been keeping a fan on. The patient denies unusual headaches, visual changes, nausea, vomiting, or dizziness. There has been no unusual cough, phlegm production, or pleurisy. This been no change in bowel or bladder habits. The patient denies unexplained fatigue or unexplained weight loss, bleeding, rash, or fever. A detailed review of systems was otherwise noncontributory.    PAST MEDICAL HISTORY: Past Medical History:  Diagnosis Date  . Arthritis    lower back  . Cancer [facility])    right breast cancer  . Family history of breast cancer   She notes that she had a heart murmur as a child, which she grew out of.   PAST SURGICAL HISTORY: Past Surgical History:  Procedure Laterality Date  . ABDOMINAL HYSTERECTOMY    . BREAST LUMPECTOMY WITH RADIOACTIVE SEED AND SENTINEL LYMPH NODE BIOPSY Right 02/16/2018   Procedure: RIGHT BREAST  LUMPECTOMY WITH RADIOACTIVE SEED AND SENTINEL LYMPH NODE BIOPSY;  Surgeon: [name], MD;  Location: [facility];  Service:  General;  Laterality: Right;  . HYMENECTOMY    . PORTACATH PLACEMENT Right 02/16/2018   Procedure: INSERTION PORT-A-CATH;  Surgeon: [name], MD;  Location: [facility];  Service: General;  Laterality: Right;  . RE-EXCISION OF BREAST LUMPECTOMY Right 02/22/2018   Procedure: RE-EXCISION OF RIGHT  BREAST LUMPECTOMY;  Surgeon: [name], MD;  Location: [facility];  Service: General;  Laterality: Right;  . REPAIR VAGINAL CUFF N/A 02/07/2017   Procedure: REPAIR VAGINAL CUFF;  Surgeon: Ena Dawley, MD;  Location: [facility];  Service: Gynecology;  Laterality: N/A;  . ROBOTIC ASSISTED TOTAL HYSTERECTOMY WITH SALPINGECTOMY Left 01/20/2017   Procedure: ROBOTIC ASSISTED TOTAL HYSTERECTOMY WITH SALPINGECTOMY;  Surgeon: [name], MD;  Location: [facility];  Service: Gynecology;  Laterality: Left;  Partial Hysterectomy without BSO  FAMILY HISTORY Family History  Problem Relation Age of Onset  . Lung cancer Maternal Grandfather   . Esophageal cancer Paternal Grandfather 31  . Breast cancer Cousin 21   As of July 2019, the patient's father is alive at 34. The patient's mother is also alive at 51. The patient has 1 brother and 5 sisters. There was a maternal grandfather diagnosed with lung cancer at 48. There was a paternal grandfather diagnosed with esophageal cancer at 7. The patient's father had a pre-cancerous esophageal finding at age 21. There was also a maternal 1st cousin diagnosed with metastatic breast cancer at age 43.    GYNECOLOGIC HISTORY:  Patient's last menstrual period was 12/21/2016 (exact date). Menarche: 48 years old Age at first live birth: 48 years old She is GXP2. She is status post partial hysterectomy without BSO in 2018 She never used HRT. She used oral contraception over 21 years ago with no complications.   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 The patient is separated  from her husband, [name]. The patient's son [name] age 110 is in the [place] [employer] stationed in [place] in the Horticulturist, commercial. He plans on working in the railroad industry after he returns. The patient's daughter [name] age 43, works at [employer].      ADVANCED DIRECTIVES: Not in place   HEALTH MAINTENANCE: Social History   Tobacco Use  . Smoking status: Never Smoker  . Smokeless tobacco: Never Used  Substance Use Topics  . Alcohol use: Yes    Comment: occ  . Drug use: No     Colonoscopy:   PAP: 2018/ prior to hysterectomy  Bone density:   No Known Allergies  Current Outpatient Medications  Medication Sig Dispense Refill  . Acetaminophen (TYLENOL PO) Take by mouth 3 (three) times daily. 2 tabs as needed for aches and pains    . cholestyramine (QUESTRAN) 4 g packet Take 1 packet (4 g total) by mouth 2 (two) times daily. 180 packet 1  . dexamethasone (DECADRON) 4 MG tablet Take 2 tablets (8 mg total) by mouth 2 (two) times daily. Start the day before Taxotere. Take once the day after, then 2 times a day x 2d. 30 tablet 1  . fluconazole (DIFLUCAN) 100 MG tablet Take 1 tablet (100 mg total) by mouth daily. 20 tablet 1  . gabapentin (NEURONTIN) 300 MG capsule Take 1 capsule (300 mg total) by mouth at bedtime. 30 capsule 2  . ibuprofen (ADVIL,MOTRIN) 800 MG tablet Take 1 tablet (800  mg total) by mouth every 8 (eight) hours as needed. 30 tablet 0  . lidocaine-prilocaine (EMLA) cream Apply to affected area once 30 g 3  . loratadine (CLARITIN) 10 MG tablet Take 10 mg by mouth daily.    Marland Kitchen LORazepam (ATIVAN) 0.5 MG tablet TAKE 1 TABLET (0.5 MG TOTAL) BY MOUTH AT BEDTIME AS NEEDED (NAUSEA OR VOMITING). 30 tablet 0  . magic mouthwash w/lidocaine SOLN Take 5 mLs by mouth 4 (four) times daily as needed for mouth pain. Swish, Swallow, or spit. 240 mL 1  . NAPROXEN PO Take by mouth as needed. 1 tab as needed for aches and pain    . nystatin (MYCOSTATIN) 100000 UNIT/ML suspension Take 5 mLs (500,000 Units  total) by mouth 4 (four) times daily. 60 mL 0  . ondansetron (ZOFRAN) 8 MG tablet Take 1 tablet (8 mg total) by mouth every 8 (eight) hours as needed for nausea or vomiting. 20 tablet 0  . oxyCODONE (OXY IR/ROXICODONE) 5 MG immediate release tablet Take 1 tablet (5 mg total) by mouth every 6 (six) hours as needed for severe pain. 15 tablet 0  . Probiotic Product (PROBIOTIC PO) Take by mouth daily.    . prochlorperazine (COMPAZINE) 10 MG tablet Take 1 tablet (10 mg total) by mouth every 6 (six) hours as needed (Nausea or vomiting). 30 tablet 1   No current facility-administered medications for this visit.     OBJECTIVE: Middle-aged white woman who appears well  Vitals:   07/18/18 1140  BP: 114/80  Pulse: 66  Resp: 18  Temp: 98.5 F (36.9 C)  SpO2: 100%     Body mass index is 25.54 kg/m.   Wt Readings from Last 3 Encounters:  07/18/18 165 lb 8 oz (75.1 kg)  06/20/18 169 lb 9.6 oz (76.9 kg)  06/14/18 168 lb 9.6 oz (76.5 kg)  ECOG FS:0  Sclerae unicteric, EOMs intact Oropharynx clear and moist No cervical or supraclavicular adenopathy Lungs no rales or rhonchi Heart regular rate and rhythm Abd soft, nontender, positive bowel sounds MSK no focal spinal tenderness, no upper extremity lymphedema Neuro: nonfocal, well oriented, appropriate affect Breasts: The right breast is status post lumpectomy.  There is no evidence of local recurrence.  The left breast is benign.  Both axillae are benign     LAB RESULTS:  CMP     Component Value Date/Time   NA 144 07/18/2018 1038   K 4.2 07/18/2018 1038   CL 107 07/18/2018 1038   CO2 26 07/18/2018 1038   GLUCOSE 75 07/18/2018 1038   BUN 12 07/18/2018 1038   CREATININE 0.73 07/18/2018 1038   CALCIUM 9.8 07/18/2018 1038   PROT 6.9 07/18/2018 1038   ALBUMIN 3.9 07/18/2018 1038   AST 17 07/18/2018 1038   ALT 18 07/18/2018 1038   ALKPHOS 65 07/18/2018 1038   BILITOT 0.3 07/18/2018 1038   GFRNONAA >60 07/18/2018 1038   GFRAA >60  07/18/2018 1038    No results found for: TOTALPROTELP, ALBUMINELP, A1GS, A2GS, BETS, BETA2SER, GAMS, MSPIKE, SPEI  No results found for: KPAFRELGTCHN, LAMBDASER, KAPLAMBRATIO  Lab Results  Component Value Date   WBC 6.6 07/18/2018   NEUTROABS 4.5 07/18/2018   HGB 12.0 07/18/2018   HCT 37.5 07/18/2018   MCV 101.1 (H) 07/18/2018   PLT 287 07/18/2018    _0 @  No results found for: LABCA2  No components found for: TZGYFV494  No results for input(s): INR in the last 168 hours.  No results found for: LABCA2  No results found for: QMG867  No results found for: YPP509  No results found for: TOI712  No results found for: CA2729  No components found for: HGQUANT  No results found for: CEA1 / No results found for: CEA1   No results found for: AFPTUMOR  No results found for: CHROMOGRNA  No results found for: PSA1  Appointment on 07/18/2018  Component Date Value Ref Range Status  . WBC Count 07/18/2018 6.6  4.0 - 10.5 K/uL Final  . RBC 07/18/2018 3.71* 3.87 - 5.11 MIL/uL Final  . Hemoglobin 07/18/2018 12.0  12.0 - 15.0 g/dL Final  . HCT 07/18/2018 37.5  36.0 - 46.0 % Final  . MCV 07/18/2018 101.1* 80.0 - 100.0 fL Final  . MCH 07/18/2018 32.3  26.0 - 34.0 pg Final  . MCHC 07/18/2018 32.0  30.0 - 36.0 g/dL Final  . RDW 07/18/2018 11.7  11.5 - 15.5 % Final  . Platelet Count 07/18/2018 287  150 - 400 K/uL Final  . nRBC 07/18/2018 0.0  0.0 - 0.2 % Final  . Neutrophils Relative % 07/18/2018 68  % Final  . Neutro Abs 07/18/2018 4.5  1.7 - 7.7 K/uL Final  . Lymphocytes Relative 07/18/2018 21  % Final  . Lymphs Abs 07/18/2018 1.4  0.7 - 4.0 K/uL Final  . Monocytes Relative 07/18/2018 9  % Final  . Monocytes Absolute 07/18/2018 0.6  0.1 - 1.0 K/uL Final  . Eosinophils Relative 07/18/2018 1  % Final  . Eosinophils Absolute 07/18/2018 0.1  0.0 - 0.5 K/uL Final  . Basophils Relative 07/18/2018 1  % Final  . Basophils Absolute 07/18/2018 0.0  0.0 - 0.1 K/uL Final  .  Immature Granulocytes 07/18/2018 0  % Final  . Abs Immature Granulocytes 07/18/2018 0.02  0.00 - 0.07 K/uL Final   Performed at [facility], [place] [address], [place], [place] [address]  . Sodium 07/18/2018 144  135 - 145 mmol/L Final  . Potassium 07/18/2018 4.2  3.5 - 5.1 mmol/L Final  . Chloride 07/18/2018 107  98 - 111 mmol/L Final  . CO2 07/18/2018 26  22 - 32 mmol/L Final  . Glucose, Bld 07/18/2018 75  70 - 99 mg/dL Final  . BUN 07/18/2018 12  6 - 20 mg/dL Final  . Creatinine 07/18/2018 0.73  0.44 - 1.00 mg/dL Final  . Calcium 07/18/2018 9.8  8.9 - 10.3 mg/dL Final  . Total Protein 07/18/2018 6.9  6.5 - 8.1 g/dL Final  . Albumin 07/18/2018 3.9  3.5 - 5.0 g/dL Final  . AST 07/18/2018 17  15 - 41 U/L Final  . ALT 07/18/2018 18  0 - 44 U/L Final  . Alkaline Phosphatase 07/18/2018 65  38 - 126 U/L Final  . Total Bilirubin 07/18/2018 0.3  0.3 - 1.2 mg/dL Final  . GFR, Est Non Af Am 07/18/2018 >60  >60 mL/min Final  . GFR, Est AFR Am 07/18/2018 >60  >60 mL/min Final  . Anion gap 07/18/2018 11  5 - 15 Final   Performed at [facility], [place] [address] [place], [place] [address]    (this displays the last labs from the last 3 days)  No results found for: TOTALPROTELP, ALBUMINELP, A1GS, A2GS, BETS, BETA2SER, GAMS, MSPIKE, SPEI (this displays SPEP labs)  No results found for: KPAFRELGTCHN, LAMBDASER, KAPLAMBRATIO (kappa/lambda light chains)  No results found for: HGBA, HGBA2QUANT, HGBFQUANT, HGBSQUAN (Hemoglobinopathy evaluation)   No results found for: LDH  No results found  for: IRON, TIBC, IRONPCTSAT (Iron and TIBC)  No results found for: FERRITIN  Urinalysis No results found for: COLORURINE, APPEARANCEUR, LABSPEC, PHURINE, GLUCOSEU, HGBUR, BILIRUBINUR, KETONESUR, PROTEINUR, UROBILINOGEN, NITRITE, LEUKOCYTESUR   STUDIES: No results found.  ELIGIBLE FOR AVAILABLE RESEARCH PROTOCOL: BCEP  ASSESSMENT: 48 y.o.  [place], [place]  woman status post right breast upper inner quadrant biopsy 12/29/2017, for a clinical T1c N0, stage IA invasive ductal carcinoma, triple positive, with an MIB-1 of 80%.  (1) genetics testing 02/02/2018 though the CancerNext gene panel offered by [facility] showed no deleterious mutations in  APC, ATM, BARD1, BMPR1A,BRCA1, BRCA2, BRIP1, CDH1, CDK4, CDKN2A, CHEK2, DICER1, HOXB13, MLH1, MRE11A, MSH2, MSH6, MUTYH, NBN, NF1, PALB2, PMS2, POLD1, POLE, PTEN, RAD50, RAD51C, RAD51D, SMAD4, SMARCA4, STK11 and TP53 (sequencing and deletion/duplication); EPCAM and GREM1 (deletion/duplication only).  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>A) )   (2) right lumpectomy and sentinel lymph node sampling 02/16/2018 showed a pT2 pN0, stage IB invasive ductal carcinoma, grade 3, with a positive inferior margin; a total of 5 lymph nodes were removed  (a) additional surgery 02/27/2018 cleared the margins  (3) started carboplatin, docetaxel, trastuzumab and pertuzumab 03/11/2018, repeated every 21 days x 6, last dose 07/18/2018.    (a) Pertuzumab omitted after cycle 1 due to diarrhea.  (b) Gemcitabine substituted for Docetaxel starting with cycle 5 due to peripheral neuropathy  (4) continue trastuzumab to total 6 months (through February 2020).  (a) echocardiogram 01/21/2018 showed an ejection fraction in the 55-60% range  (b) repeat echocardiogram 06/14/2018 showed an ejection fraction in the 60-65%  (5) adjuvant radiation to follow  (6) antiestrogens to start at the completion of local treatment   PLAN:  Meoshia completes her radiation treatments today.  She has done remarkably well and she understands she has a good prognosis.  We discussed the difference between "remission" and "being cancer free".  The latter is a popular but somewhat inaccurate and confusing term which I generally discourage.  However if her friends ask her if she is cancer free she should answer yes.  What  that means is that she is in remission.  We discussed the controversy whether 6 months of trastuzumab is sufficient.  In the study where this was determined most patients received doxorubicin chemotherapy which is not standard and HER-2 positive patients in the states.  After the discussion we are going to do a total of 6 months of trastuzumab which means she will finish mid February of this year.  She is being referred for adjuvant radiation.  She will see me again when she completes her trastuzumab treatments and at that point we will start discussing antiestrogens  She knows to call for any other issue that may develop before the next visit.   Taja Pentland, [name], MD  07/18/18 12:24 PM Medical Oncology and Hematology [facility] [address] [place], [place] [address] Tel. [phone]    Fax. [phone]  I, Jacqualyn Posey am acting as a Education administrator for Chauncey Cruel, MD.   I, Lurline Del MD, have reviewed the above documentation for accuracy and completeness, and I agree with the above.

## 2018-07-18 ENCOUNTER — Telehealth: Payer: Self-pay | Admitting: Oncology

## 2018-07-18 ENCOUNTER — Inpatient Hospital Stay: Payer: Managed Care, Other (non HMO)

## 2018-07-18 ENCOUNTER — Encounter: Payer: Self-pay | Admitting: *Deleted

## 2018-07-18 ENCOUNTER — Inpatient Hospital Stay (HOSPITAL_BASED_OUTPATIENT_CLINIC_OR_DEPARTMENT_OTHER): Payer: Managed Care, Other (non HMO) | Admitting: Oncology

## 2018-07-18 ENCOUNTER — Other Ambulatory Visit: Payer: Self-pay | Admitting: *Deleted

## 2018-07-18 VITALS — BP 114/80 | HR 66 | Temp 98.5°F | Resp 18 | Ht 67.5 in | Wt 165.5 lb

## 2018-07-18 DIAGNOSIS — N951 Menopausal and female climacteric states: Secondary | ICD-10-CM | POA: Diagnosis not present

## 2018-07-18 DIAGNOSIS — Z170 Estrogen receptor positive status [ER+]: Secondary | ICD-10-CM

## 2018-07-18 DIAGNOSIS — Z5111 Encounter for antineoplastic chemotherapy: Secondary | ICD-10-CM | POA: Diagnosis not present

## 2018-07-18 DIAGNOSIS — C50211 Malignant neoplasm of upper-inner quadrant of right female breast: Secondary | ICD-10-CM | POA: Diagnosis not present

## 2018-07-18 DIAGNOSIS — Z801 Family history of malignant neoplasm of trachea, bronchus and lung: Secondary | ICD-10-CM

## 2018-07-18 DIAGNOSIS — Z95828 Presence of other vascular implants and grafts: Secondary | ICD-10-CM

## 2018-07-18 DIAGNOSIS — Z7689 Persons encountering health services in other specified circumstances: Secondary | ICD-10-CM | POA: Diagnosis not present

## 2018-07-18 DIAGNOSIS — Z803 Family history of malignant neoplasm of breast: Secondary | ICD-10-CM

## 2018-07-18 DIAGNOSIS — Z510 Encounter for antineoplastic radiation therapy: Secondary | ICD-10-CM | POA: Diagnosis not present

## 2018-07-18 DIAGNOSIS — Z800 Family history of malignant neoplasm of digestive organs: Secondary | ICD-10-CM

## 2018-07-18 LAB — CMP (CANCER CENTER ONLY)
ALT: 18 U/L (ref 0–44)
AST: 17 U/L (ref 15–41)
Albumin: 3.9 g/dL (ref 3.5–5.0)
Alkaline Phosphatase: 65 U/L (ref 38–126)
Anion gap: 11 (ref 5–15)
BUN: 12 mg/dL (ref 6–20)
CO2: 26 mmol/L (ref 22–32)
Calcium: 9.8 mg/dL (ref 8.9–10.3)
Chloride: 107 mmol/L (ref 98–111)
Creatinine: 0.73 mg/dL (ref 0.44–1.00)
GFR, Est AFR Am: >60 mL/min (ref >60)
GFR, Estimated: >60 mL/min (ref >60)
Glucose, Bld: 75 mg/dL (ref 70–99)
Potassium: 4.2 mmol/L (ref 3.5–5.1)
Sodium: 144 mmol/L (ref 135–145)
Total Bilirubin: 0.3 mg/dL (ref 0.3–1.2)
Total Protein: 6.9 g/dL (ref 6.5–8.1)

## 2018-07-18 LAB — CBC WITH DIFFERENTIAL (CANCER CENTER ONLY)
Abs Immature Granulocytes: 0.02 10*3/uL (ref 0.00–0.07)
Basophils Absolute: 0 10*3/uL (ref 0.0–0.1)
Basophils Relative: 1 %
Eosinophils Absolute: 0.1 10*3/uL (ref 0.0–0.5)
Eosinophils Relative: 1 %
HCT: 37.5 % (ref 36.0–46.0)
Hemoglobin: 12 g/dL (ref 12.0–15.0)
Immature Granulocytes: 0 %
Lymphocytes Relative: 21 %
Lymphs Abs: 1.4 10*3/uL (ref 0.7–4.0)
MCH: 32.3 pg (ref 26.0–34.0)
MCHC: 32 g/dL (ref 30.0–36.0)
MCV: 101.1 fL — ABNORMAL HIGH (ref 80.0–100.0)
Monocytes Absolute: 0.6 10*3/uL (ref 0.1–1.0)
Monocytes Relative: 9 %
Neutro Abs: 4.5 10*3/uL (ref 1.7–7.7)
Neutrophils Relative %: 68 %
Platelet Count: 287 10*3/uL (ref 150–400)
RBC: 3.71 MIL/uL — ABNORMAL LOW (ref 3.87–5.11)
RDW: 11.7 % (ref 11.5–15.5)
WBC Count: 6.6 10*3/uL (ref 4.0–10.5)
nRBC: 0 % (ref 0.0–0.2)

## 2018-07-18 MED ORDER — PEGFILGRASTIM 6 MG/0.6ML ~~LOC~~ PSKT
PREFILLED_SYRINGE | SUBCUTANEOUS | Status: AC
  Filled 2018-07-18: qty 0.6

## 2018-07-18 MED ORDER — TRASTUZUMAB CHEMO 150 MG IV SOLR
450.0000 mg | Freq: Once | INTRAVENOUS | Status: AC
  Administered 2018-07-18: 450 mg via INTRAVENOUS
  Filled 2018-07-18: qty 21.43

## 2018-07-18 MED ORDER — PALONOSETRON HCL INJECTION 0.25 MG/5ML
INTRAVENOUS | Status: AC
  Filled 2018-07-18: qty 5

## 2018-07-18 MED ORDER — SODIUM CHLORIDE 0.9 % IV SOLN
Freq: Once | INTRAVENOUS | Status: AC
  Administered 2018-07-18: 13:00:00 via INTRAVENOUS
  Filled 2018-07-18: qty 5

## 2018-07-18 MED ORDER — PEGFILGRASTIM 6 MG/0.6ML ~~LOC~~ PSKT
6.0000 mg | PREFILLED_SYRINGE | Freq: Once | SUBCUTANEOUS | Status: AC
  Administered 2018-07-18: 6 mg via SUBCUTANEOUS

## 2018-07-18 MED ORDER — SODIUM CHLORIDE 0.9% FLUSH
10.0000 mL | Freq: Once | INTRAVENOUS | Status: DC
  Filled 2018-07-18: qty 10

## 2018-07-18 MED ORDER — SODIUM CHLORIDE 0.9 % IV SOLN
659.0000 mg | Freq: Once | INTRAVENOUS | Status: AC
  Administered 2018-07-18: 660 mg via INTRAVENOUS
  Filled 2018-07-18: qty 66

## 2018-07-18 MED ORDER — SODIUM CHLORIDE 0.9 % IV SOLN
Freq: Once | INTRAVENOUS | Status: AC
  Administered 2018-07-18: 13:00:00 via INTRAVENOUS
  Filled 2018-07-18: qty 250

## 2018-07-18 MED ORDER — PALONOSETRON HCL INJECTION 0.25 MG/5ML
0.2500 mg | Freq: Once | INTRAVENOUS | Status: AC
  Administered 2018-07-18: 0.25 mg via INTRAVENOUS

## 2018-07-18 MED ORDER — DIPHENHYDRAMINE HCL 12.5 MG/5ML PO ELIX
ORAL_SOLUTION | ORAL | Status: AC
  Filled 2018-07-18: qty 5

## 2018-07-18 MED ORDER — HEPARIN SOD (PORK) LOCK FLUSH 100 UNIT/ML IV SOLN
500.0000 [IU] | Freq: Once | INTRAVENOUS | Status: AC | PRN
  Administered 2018-07-18: 500 [IU]
  Filled 2018-07-18: qty 5

## 2018-07-18 MED ORDER — SODIUM CHLORIDE 0.9 % IV SOLN
800.0000 mg/m2 | Freq: Once | INTRAVENOUS | Status: AC
  Administered 2018-07-18: 1558 mg via INTRAVENOUS
  Filled 2018-07-18: qty 40.98

## 2018-07-18 MED ORDER — DIPHENHYDRAMINE HCL 12.5 MG/5ML PO ELIX
12.5000 mg | ORAL_SOLUTION | Freq: Once | ORAL | Status: AC
  Administered 2018-07-18: 12.5 mg via ORAL

## 2018-07-18 MED ORDER — HEPARIN SOD (PORK) LOCK FLUSH 100 UNIT/ML IV SOLN
500.0000 [IU] | Freq: Once | INTRAVENOUS | Status: DC
  Filled 2018-07-18: qty 5

## 2018-07-18 MED ORDER — ACETAMINOPHEN 325 MG PO TABS
650.0000 mg | ORAL_TABLET | Freq: Once | ORAL | Status: AC
  Administered 2018-07-18: 650 mg via ORAL

## 2018-07-18 MED ORDER — SODIUM CHLORIDE 0.9% FLUSH
10.0000 mL | INTRAVENOUS | Status: DC | PRN
  Administered 2018-07-18: 10 mL
  Filled 2018-07-18: qty 10

## 2018-07-18 MED ORDER — ACETAMINOPHEN 325 MG PO TABS
ORAL_TABLET | ORAL | Status: AC
  Filled 2018-07-18: qty 2

## 2018-07-18 NOTE — Progress Notes (Signed)
[place] Work  Holiday representative received referral from Art therapist for financial resources.  CSW met with patient in the infusion room to offer support and assess for needs.  Patient stated she is currently working, but due to treatment has had to reduce her work hours.  CSW and patient discussed financial resources.  Patient plans to contact financial advocate to discuss the [employer].  CSW and patient reviewed applications for [facility] in [place], and [place].  Patient plans to collect required documents and follow up with CSW.  CSW provided contact information and encourage patient to call with questions or concerns.    Johnnye Lana, MSW, LCSW, OSW-C Clinical Social Worker [facility] [phone]

## 2018-07-18 NOTE — Patient Instructions (Signed)
[facility] Discharge Instructions for Patients Receiving Chemotherapy  Today you received the following chemotherapy agents Trastuzumab (Herceptin), Gemzar (Gemcitabine) & Carboplatin (Paraplatin).   To help prevent nausea and vomiting after your treatment, we encourage you to take your nausea medication as prescribed.   If you develop nausea and vomiting that is not controlled by your nausea medication, call the clinic.   BELOW ARE SYMPTOMS THAT SHOULD BE REPORTED IMMEDIATELY:  *FEVER GREATER THAN 100.5 F  *CHILLS WITH OR WITHOUT FEVER  NAUSEA AND VOMITING THAT IS NOT CONTROLLED WITH YOUR NAUSEA MEDICATION  *UNUSUAL SHORTNESS OF BREATH  *UNUSUAL BRUISING OR BLEEDING  TENDERNESS IN MOUTH AND THROAT WITH OR WITHOUT PRESENCE OF ULCERS  *URINARY PROBLEMS  *BOWEL PROBLEMS  UNUSUAL RASH Items with * indicate a potential emergency and should be followed up as soon as possible.  Feel free to call the clinic should you have any questions or concerns. The clinic phone number is [phone].  Please show the [place] at check-in to the Emergency Department and triage nurse.  Gemcitabine injection What is this medicine? GEMCITABINE (jem SIT a been) is a chemotherapy drug. This medicine is used to treat many types of cancer like breast cancer, lung cancer, pancreatic cancer, and ovarian cancer. This medicine may be used for other purposes; ask your health care provider or pharmacist if you have questions. COMMON BRAND NAME(S): Gemzar What should I tell my health care provider before I take this medicine? They need to know if you have any of these conditions: -blood disorders -infection -kidney disease -liver disease -recent or ongoing radiation therapy -an unusual or allergic reaction to gemcitabine, other chemotherapy, other medicines, foods, dyes, or preservatives -pregnant or trying to get pregnant -breast-feeding How should I use this medicine? This  drug is given as an infusion into a vein. It is administered in a hospital or clinic by a specially trained health care professional. Talk to your pediatrician regarding the use of this medicine in children. Special care may be needed. Overdosage: If you think you have taken too much of this medicine contact a poison control center or emergency room at once. NOTE: This medicine is only for you. Do not share this medicine with others. What if I miss a dose? It is important not to miss your dose. Call your doctor or health care professional if you are unable to keep an appointment. What may interact with this medicine? -medicines to increase blood counts like filgrastim, pegfilgrastim, sargramostim -some other chemotherapy drugs like cisplatin -vaccines Talk to your doctor or health care professional before taking any of these medicines: -acetaminophen -aspirin -ibuprofen -ketoprofen -naproxen This list may not describe all possible interactions. Give your health care provider a list of all the medicines, herbs, non-prescription drugs, or dietary supplements you use. Also tell them if you smoke, drink alcohol, or use illegal drugs. Some items may interact with your medicine. What should I watch for while using this medicine? Visit your doctor for checks on your progress. This drug may make you feel generally unwell. This is not uncommon, as chemotherapy can affect healthy cells as well as cancer cells. Report any side effects. Continue your course of treatment even though you feel ill unless your doctor tells you to stop. In some cases, you may be given additional medicines to help with side effects. Follow all directions for their use. Call your doctor or health care professional for advice if you get a fever, chills or sore throat,  or other symptoms of a cold or flu. Do not treat yourself. This drug decreases your body's ability to fight infections. Try to avoid being around people who are  sick. This medicine may increase your risk to bruise or bleed. Call your doctor or health care professional if you notice any unusual bleeding. Be careful brushing and flossing your teeth or using a toothpick because you may get an infection or bleed more easily. If you have any dental work done, tell your dentist you are receiving this medicine. Avoid taking products that contain aspirin, acetaminophen, ibuprofen, naproxen, or ketoprofen unless instructed by your doctor. These medicines may hide a fever. Women should inform their doctor if they wish to become pregnant or think they might be pregnant. There is a potential for serious side effects to an unborn child. Talk to your health care professional or pharmacist for more information. Do not breast-feed an infant while taking this medicine. What side effects may I notice from receiving this medicine? Side effects that you should report to your doctor or health care professional as soon as possible: -allergic reactions like skin rash, itching or hives, swelling of the face, lips, or tongue -low blood counts - this medicine may decrease the number of white blood cells, red blood cells and platelets. You may be at increased risk for infections and bleeding. -signs of infection - fever or chills, cough, sore throat, pain or difficulty passing urine -signs of decreased platelets or bleeding - bruising, pinpoint red spots on the skin, black, tarry stools, blood in the urine -signs of decreased red blood cells - unusually weak or tired, fainting spells, lightheadedness -breathing problems -chest pain -mouth sores -nausea and vomiting -pain, swelling, redness at site where injected -pain, tingling, numbness in the hands or feet -stomach pain -swelling of ankles, feet, hands -unusual bleeding Side effects that usually do not require medical attention (report to your doctor or health care professional if they continue or are  bothersome): -constipation -diarrhea -hair loss -loss of appetite -stomach upset This list may not describe all possible side effects. Call your doctor for medical advice about side effects. You may report side effects to FDA at 1-800-FDA-1088. Where should I keep my medicine? This drug is given in a hospital or clinic and will not be stored at home. NOTE: This sheet is a summary. It may not cover all possible information. If you have questions about this medicine, talk to your doctor, pharmacist, or health care provider.  2018 Elsevier/Gold Standard (2007-11-08 18:45:54)

## 2018-07-18 NOTE — Telephone Encounter (Signed)
Gave avs and calendar ° °

## 2018-07-18 NOTE — Addendum Note (Signed)
Addended by: Lurline Del C on: 07/18/2018 01:00 PM   Modules accepted: Orders

## 2018-07-24 NOTE — Progress Notes (Signed)
Monica [name]  [phone] [phone]     ID: Monica [name] DOB: 19-Aug-1970  MR#: [id number]  TJQ#:[id number]  Patient Care Team: Monica Stalker, PA-C as PCP - General (Family Medicine) Monica Luna, MD as Consulting Physician (General Surgery) Monica [name], Monica Dad, MD as Consulting Physician (Oncology) Monica Rudd, MD as Consulting Physician (Radiation Oncology) Monica Louis, MD as Consulting Physician (Obstetrics and Gynecology) Monica Dresser, MD as Consulting Physician (Cardiology) OTHER MD: [facility]  CHIEF COMPLAINT: triple positive breast cancer  CURRENT TREATMENT: Completed adjuvant chemo-immunotherapy, continuing trastuzumab; adjuvant radiation pending  HISTORY OF CURRENT ILLNESS: From the original intake note:  Monica [name] (pronounced "uric") palpated a right supraclavicular mass and felt tenderness after stretching one day. She brought this to her PA's attention.  The patient underwent bilateral diagnostic mammography with tomography and right breast ultrasonography at [facility] on 12/20/2017 showing: breast density category C. There was a suspicious palpable mass at the 1 o'clock position upper inner quadrant measuring 1.4 x 1.3 x 1.3 cm located 12 cm from the nipple. There was an additional mass at the 8 o'clock radiant measuring 1.2 x 1.0 x 1.4 cm, which likely represented a cyst. Ultrasound evaluation of the right axilla demonstrates no evidence of lymphadenopathy  Accordingly on 12/29/2017 she proceeded to biopsy of the right breast area in question. The pathology from this procedure showed ([id number]): Invasive ductal carcinoma, grade III. Prognostic indicators significant for: estrogen receptor, 80% positive and progesterone receptor, 70% positive, both with strong staining intensity. Proliferation marker Ki67 at 80%. HER2 amplified with ratios HER2/CEP17 signals 3.44 and average HER2 copies per cell  10.65.  The patient's subsequent history is as detailed below.  INTERVAL HISTORY: Monica [name] returns today for follow-up and treatment of her triple positive breast cancer. She is accompanied by her son.  The patient completed 6 cycles of carboplatin, docetaxel, trastuzumab, and pertuzumab on 07/18/2018. She had a small issue with her mouth; "It felt like I burnt my tongue." This has since resolved.  The patient continues with trastuzumab.  Since her last visit here, she has not undergone any additional studies.     REVIEW OF SYSTEMS: Monica [name] is intermittent fasting. Her hair is coming back.  She has not noted any bald spots.  Yesterday, she took a 30 minute walk with the dog, but admits that since the holidays she has not been as regular with her exercise routines. The patient denies unusual headaches, visual changes, nausea, vomiting, or dizziness. There has been no unusual cough, phlegm production, or pleurisy. This been no change in bowel or bladder habits. The patient denies unexplained fatigue or unexplained weight loss, bleeding, rash, or fever. A detailed review of systems was otherwise noncontributory.    PAST MEDICAL HISTORY: Past Medical History:  Diagnosis Date  . Arthritis    lower back  . Cancer [facility])    right breast cancer  . Family history of breast cancer   She notes that she had a heart murmur as a child, which she grew out of.   PAST SURGICAL HISTORY: Past Surgical History:  Procedure Laterality Date  . ABDOMINAL HYSTERECTOMY    . BREAST LUMPECTOMY WITH RADIOACTIVE SEED AND SENTINEL LYMPH NODE BIOPSY Right 02/16/2018   Procedure: RIGHT BREAST LUMPECTOMY WITH RADIOACTIVE SEED AND SENTINEL LYMPH NODE BIOPSY;  Surgeon: Monica Luna, MD;  Location: [place];  Service: General;  Laterality: Right;  . HYMENECTOMY    . PORTACATH PLACEMENT Right 02/16/2018   Procedure:  INSERTION PORT-A-CATH;  Surgeon: Monica Luna, MD;  Location: [place];   Service: General;  Laterality: Right;  . RE-EXCISION OF BREAST LUMPECTOMY Right 02/22/2018   Procedure: RE-EXCISION OF RIGHT  BREAST LUMPECTOMY;  Surgeon: Monica Luna, MD;  Location: [place];  Service: General;  Laterality: Right;  . REPAIR VAGINAL CUFF N/A 02/07/2017   Procedure: REPAIR VAGINAL CUFF;  Surgeon: Monica Dawley, MD;  Location: [facility];  Service: Gynecology;  Laterality: N/A;  . ROBOTIC ASSISTED TOTAL HYSTERECTOMY WITH SALPINGECTOMY Left 01/20/2017   Procedure: ROBOTIC ASSISTED TOTAL HYSTERECTOMY WITH SALPINGECTOMY;  Surgeon: Monica Louis, MD;  Location: [facility];  Service: Gynecology;  Laterality: Left;  Partial Hysterectomy without BSO  FAMILY HISTORY Family History  Problem Relation Age of Onset  . Lung cancer Maternal Grandfather   . Esophageal cancer Paternal Grandfather 47  . Breast cancer Cousin 72   As of July 2019, the patient's father is alive at 4. The patient's mother is also alive at 26. The patient has 1 brother and 5 sisters. There was a maternal grandfather diagnosed with lung cancer at 66. There was a paternal grandfather diagnosed with esophageal cancer at 50. The patient's father had a pre-cancerous esophageal finding at age 59. There was also a maternal 1st cousin diagnosed with metastatic breast cancer at age 68.    GYNECOLOGIC HISTORY:  Patient's last menstrual period was 12/21/2016 (exact date). Menarche: 48 years old Age at first live birth: 48 years old She is GXP2. She is status post partial hysterectomy without BSO in 2018 She never used HRT. She used oral contraception over 21 years ago with no complications.   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 The patient is separated from her husband, Monica [name]. The patient's son Monica [name] age 55 is in the [employer] and is being used stationed in [place] for 3 years beginning January 2020. He plans on working in the railroad  industry after he returns. The patient's daughter Monica [name] age 86, works at [employer].      ADVANCED DIRECTIVES: Not in place   HEALTH MAINTENANCE: Social History   Tobacco Use  . Smoking status: Never Smoker  . Smokeless tobacco: Never Used  Substance Use Topics  . Alcohol use: Yes    Comment: occ  . Drug use: No     Colonoscopy:   PAP: 2018/ prior to hysterectomy  Bone density:   No Known Allergies  Current Outpatient Medications  Medication Sig Dispense Refill  . Acetaminophen (TYLENOL PO) Take by mouth 3 (three) times daily. 2 tabs as needed for aches and pains    . ibuprofen (ADVIL,MOTRIN) 800 MG tablet Take 1 tablet (800 mg total) by mouth every 8 (eight) hours as needed. 30 tablet 0  . lidocaine-prilocaine (EMLA) cream Apply to affected area once 30 g 3  . loratadine (CLARITIN) 10 MG tablet Take 10 mg by mouth daily.    Marland Kitchen NAPROXEN PO Take by mouth as needed. 1 tab as needed for aches and pain    . Probiotic Product (PROBIOTIC PO) Take by mouth daily.    . tamoxifen (NOLVADEX) 20 MG tablet Take 1 tablet (20 mg total) by mouth daily for 30 days. 90 tablet 12  . venlafaxine XR (EFFEXOR-XR) 37.5 MG 24 hr capsule Take 1 capsule (37.5 mg total) by mouth daily with breakfast. 90 capsule 4   No current facility-administered medications for this visit.     OBJECTIVE: Middle-aged  white woman in no acute distress  Vitals:   07/25/18 1000  BP: (!) 135/97  Pulse: 62  Resp: 18  Temp: 98.6 F (37 C)  SpO2: 100%     Body mass index is 26.94 kg/m.   Wt Readings from Last 3 Encounters:  07/25/18 174 lb 9.6 oz (79.2 kg)  07/18/18 165 lb 8 oz (75.1 kg)  06/20/18 169 lb 9.6 oz (76.9 kg)  ECOG FS:0  Sclerae unicteric, pupils round and equal No cervical or supraclavicular adenopathy Lungs no rales or rhonchi Heart regular rate and rhythm Abd soft, nontender, positive bowel sounds MSK no focal spinal tenderness, no upper extremity lymphedema Neuro: nonfocal, well  oriented, appropriate affect Breasts: The right breast has undergone lumpectomy.  There is no evidence of local recurrence or residual disease.  Left breast is benign.  Both axillae are benign.  LAB RESULTS:  CMP     Component Value Date/Time   NA 144 07/18/2018 1038   K 4.2 07/18/2018 1038   CL 107 07/18/2018 1038   CO2 26 07/18/2018 1038   GLUCOSE 75 07/18/2018 1038   BUN 12 07/18/2018 1038   CREATININE 0.73 07/18/2018 1038   CALCIUM 9.8 07/18/2018 1038   PROT 6.9 07/18/2018 1038   ALBUMIN 3.9 07/18/2018 1038   AST 17 07/18/2018 1038   ALT 18 07/18/2018 1038   ALKPHOS 65 07/18/2018 1038   BILITOT 0.3 07/18/2018 1038   GFRNONAA >60 07/18/2018 1038   GFRAA >60 07/18/2018 1038    No results found for: TOTALPROTELP, ALBUMINELP, A1GS, A2GS, BETS, BETA2SER, GAMS, MSPIKE, SPEI  No results found for: KPAFRELGTCHN, LAMBDASER, KAPLAMBRATIO  Lab Results  Component Value Date   WBC 18.7 (H) 07/25/2018   NEUTROABS 15.5 (H) 07/25/2018   HGB 12.4 07/25/2018   HCT 38.8 07/25/2018   MCV 101.6 (H) 07/25/2018   PLT 237 07/25/2018    '@LASTCHEMISTRY' @  No results found for: LABCA2  No components found for: VEHMCN470  No results for input(s): INR in the last 168 hours.  No results found for: LABCA2  No results found for: JGG836  No results found for: OQH476  No results found for: LYY503  No results found for: CA2729  No components found for: HGQUANT  No results found for: CEA1 / No results found for: CEA1   No results found for: AFPTUMOR  No results found for: CHROMOGRNA  No results found for: PSA1  Appointment on 07/25/2018  Component Date Value Ref Range Status  . WBC Count 07/25/2018 18.7* 4.0 - 10.5 K/uL Final  . RBC 07/25/2018 3.82* 3.87 - 5.11 MIL/uL Final  . Hemoglobin 07/25/2018 12.4  12.0 - 15.0 g/dL Final  . HCT 07/25/2018 38.8  36.0 - 46.0 % Final  . MCV 07/25/2018 101.6* 80.0 - 100.0 fL Final  . MCH 07/25/2018 32.5  26.0 - 34.0 pg Final  . MCHC  07/25/2018 32.0  30.0 - 36.0 g/dL Final  . RDW 07/25/2018 11.4* 11.5 - 15.5 % Final  . Platelet Count 07/25/2018 237  150 - 400 K/uL Final  . nRBC 07/25/2018 0.0  0.0 - 0.2 % Final  . Neutrophils Relative % 07/25/2018 83  % Final  . Neutro Abs 07/25/2018 15.5* 1.7 - 7.7 K/uL Final  . Lymphocytes Relative 07/25/2018 11  % Final  . Lymphs Abs 07/25/2018 2.1  0.7 - 4.0 K/uL Final  . Monocytes Relative 07/25/2018 5  % Final  . Monocytes Absolute 07/25/2018 0.9  0.1 - 1.0 K/uL Final  . Eosinophils  Relative 07/25/2018 0  % Final  . Eosinophils Absolute 07/25/2018 0.1  0.0 - 0.5 K/uL Final  . Basophils Relative 07/25/2018 0  % Final  . Basophils Absolute 07/25/2018 0.1  0.0 - 0.1 K/uL Final  . Immature Granulocytes 07/25/2018 1  % Final  . Abs Immature Granulocytes 07/25/2018 0.19* 0.00 - 0.07 K/uL Final   Performed at [facility], [place] [address], [place], [place] [address]    (this displays the last labs from the last 3 days)  No results found for: TOTALPROTELP, ALBUMINELP, A1GS, A2GS, BETS, BETA2SER, GAMS, MSPIKE, SPEI (this displays SPEP labs)  No results found for: KPAFRELGTCHN, LAMBDASER, KAPLAMBRATIO (kappa/lambda light chains)  No results found for: HGBA, HGBA2QUANT, HGBFQUANT, HGBSQUAN (Hemoglobinopathy evaluation)   No results found for: LDH  No results found for: IRON, TIBC, IRONPCTSAT (Iron and TIBC)  No results found for: FERRITIN  Urinalysis No results found for: COLORURINE, APPEARANCEUR, LABSPEC, PHURINE, GLUCOSEU, HGBUR, BILIRUBINUR, KETONESUR, PROTEINUR, UROBILINOGEN, NITRITE, LEUKOCYTESUR   STUDIES: Due for repeat echocardiogram in March  ELIGIBLE FOR AVAILABLE RESEARCH PROTOCOL: BCEP  ASSESSMENT: 48 y.o.  [place], [place] woman status post right breast upper inner quadrant biopsy 12/29/2017, for a clinical T1c N0, stage IA invasive ductal carcinoma, triple positive, with an MIB-1 of 80%.  (1) genetics testing 02/02/2018 though the  CancerNext gene panel offered by [facility] showed no deleterious mutations in  APC, ATM, BARD1, BMPR1A,BRCA1, BRCA2, BRIP1, CDH1, CDK4, CDKN2A, CHEK2, DICER1, HOXB13, MLH1, MRE11A, MSH2, MSH6, MUTYH, NBN, NF1, PALB2, PMS2, POLD1, POLE, PTEN, RAD50, RAD51C, RAD51D, SMAD4, SMARCA4, STK11 and TP53 (sequencing and deletion/duplication); EPCAM and GREM1 (deletion/duplication only).  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>A) )   (2) right lumpectomy and sentinel lymph node sampling 02/16/2018 showed a pT2 pN0, stage IB invasive ductal carcinoma, grade 3, with a positive inferior margin; a total of 5 lymph nodes were removed  (a) additional surgery 02/27/2018 cleared the margins  (3) started carboplatin, docetaxel, trastuzumab and pertuzumab 03/11/2018, repeated every 21 days x 6, last dose 07/18/2018.    (a) Pertuzumab omitted after cycle 1 due to diarrhea.  (b) Gemcitabine substituted for Docetaxel starting with cycle 5 due to peripheral neuropathy  (4) continue trastuzumab to total 6 months (through February 2020).  (a) echocardiogram 01/21/2018 showed an ejection fraction in the 55-60% range  (b) repeat echocardiogram 06/14/2018 showed an ejection fraction in the 60-65%  (5) adjuvant radiation to follow  (6) to start tamoxifen at the completion of local treatment  (a) the patient is status post hysterectomy, not bilateral salpingo-oophorectomy  (b) Hor[place] and estradiol levels June 2020   PLAN:  Zeniya has completed her chemotherapy.  She will complete her trastuzumab treatments at the end of February.  She likely will have one last echocardiogram in March after that.  She is already scheduled to meet with radiation oncology next week.  I anticipate she will be treated through February.  2 weeks after completing radiation treatments she will start tamoxifen.  I will see her again in June.  If she tolerates tamoxifen well the plan will be to continue  antiestrogens a minimum of 5 years.  Before the June visit she will also have [place] and estradiol levels drawn.  Clinically however she is now in menopause, with significant hot flashes.  Accordingly I discussed venlafaxine with her.  We discussed the possible toxicities, side effects and complications of this agent.  She is agreeable to starting and I  have placed the prescription for her.  She knows to call for any other issues that may develop before the next visit.  Dwon Sky, Monica Dad, MD  07/25/18 10:38 AM Medical Oncology and Hematology [facility] [address] [place], [place] [address] Tel. [phone]    Fax. [phone]  I, Jacqualyn Posey am acting as a Education administrator for Chauncey Cruel, MD.   I, Lurline Del MD, have reviewed the above documentation for accuracy and completeness, and I agree with the above.

## 2018-07-25 ENCOUNTER — Inpatient Hospital Stay: Payer: Managed Care, Other (non HMO)

## 2018-07-25 ENCOUNTER — Inpatient Hospital Stay (HOSPITAL_BASED_OUTPATIENT_CLINIC_OR_DEPARTMENT_OTHER): Payer: Managed Care, Other (non HMO) | Admitting: Oncology

## 2018-07-25 VITALS — BP 135/97 | HR 62 | Temp 98.6°F | Resp 18 | Ht 67.5 in | Wt 174.6 lb

## 2018-07-25 DIAGNOSIS — C50211 Malignant neoplasm of upper-inner quadrant of right female breast: Secondary | ICD-10-CM

## 2018-07-25 DIAGNOSIS — Z170 Estrogen receptor positive status [ER+]: Secondary | ICD-10-CM | POA: Diagnosis not present

## 2018-07-25 DIAGNOSIS — Z510 Encounter for antineoplastic radiation therapy: Secondary | ICD-10-CM | POA: Diagnosis not present

## 2018-07-25 DIAGNOSIS — Z90710 Acquired absence of both cervix and uterus: Secondary | ICD-10-CM

## 2018-07-25 DIAGNOSIS — Z803 Family history of malignant neoplasm of breast: Secondary | ICD-10-CM

## 2018-07-25 LAB — CMP (CANCER CENTER ONLY)
ALT: 27 U/L (ref 0–44)
AST: 19 U/L (ref 15–41)
Albumin: 4.1 g/dL (ref 3.5–5.0)
Alkaline Phosphatase: 102 U/L (ref 38–126)
Anion gap: 10 (ref 5–15)
BUN: 12 mg/dL (ref 6–20)
CO2: 27 mmol/L (ref 22–32)
Calcium: 10 mg/dL (ref 8.9–10.3)
Chloride: 103 mmol/L (ref 98–111)
Creatinine: 0.75 mg/dL (ref 0.44–1.00)
GFR, Est AFR Am: >60 mL/min (ref >60)
GFR, Estimated: >60 mL/min (ref >60)
Glucose, Bld: 90 mg/dL (ref 70–99)
Potassium: 4.1 mmol/L (ref 3.5–5.1)
Sodium: 140 mmol/L (ref 135–145)
Total Bilirubin: 0.3 mg/dL (ref 0.3–1.2)
Total Protein: 7 g/dL (ref 6.5–8.1)

## 2018-07-25 LAB — CBC WITH DIFFERENTIAL (CANCER CENTER ONLY)
Abs Immature Granulocytes: 0.19 10*3/uL — ABNORMAL HIGH (ref 0.00–0.07)
Basophils Absolute: 0.1 10*3/uL (ref 0.0–0.1)
Basophils Relative: 0 %
Eosinophils Absolute: 0.1 10*3/uL (ref 0.0–0.5)
Eosinophils Relative: 0 %
HCT: 38.8 % (ref 36.0–46.0)
Hemoglobin: 12.4 g/dL (ref 12.0–15.0)
Immature Granulocytes: 1 %
Lymphocytes Relative: 11 %
Lymphs Abs: 2.1 10*3/uL (ref 0.7–4.0)
MCH: 32.5 pg (ref 26.0–34.0)
MCHC: 32 g/dL (ref 30.0–36.0)
MCV: 101.6 fL — ABNORMAL HIGH (ref 80.0–100.0)
Monocytes Absolute: 0.9 10*3/uL (ref 0.1–1.0)
Monocytes Relative: 5 %
Neutro Abs: 15.5 10*3/uL — ABNORMAL HIGH (ref 1.7–7.7)
Neutrophils Relative %: 83 %
Platelet Count: 237 10*3/uL (ref 150–400)
RBC: 3.82 MIL/uL — ABNORMAL LOW (ref 3.87–5.11)
RDW: 11.4 % — ABNORMAL LOW (ref 11.5–15.5)
WBC Count: 18.7 10*3/uL — ABNORMAL HIGH (ref 4.0–10.5)
nRBC: 0 % (ref 0.0–0.2)

## 2018-07-25 MED ORDER — VENLAFAXINE HCL ER 37.5 MG PO CP24: 37.5000 mg | ORAL_CAPSULE | Freq: Every day | ORAL | 4 refills | Status: DC

## 2018-07-25 MED ORDER — TAMOXIFEN CITRATE 20 MG PO TABS: 20.0000 mg | ORAL_TABLET | Freq: Every day | ORAL | 12 refills | Status: AC

## 2018-08-02 ENCOUNTER — Other Ambulatory Visit: Payer: Self-pay

## 2018-08-02 ENCOUNTER — Ambulatory Visit
Admission: RE | Admit: 2018-08-02 | Discharge: 2018-08-02 | Disposition: A | Discharge status: Home / Self Care | Payer: Managed Care, Other (non HMO) | Source: Ambulatory Visit | Attending: Radiation Oncology | Admitting: Radiation Oncology

## 2018-08-02 ENCOUNTER — Encounter: Payer: Self-pay | Admitting: *Deleted

## 2018-08-02 ENCOUNTER — Inpatient Hospital Stay: Payer: Managed Care, Other (non HMO) | Admitting: *Deleted

## 2018-08-02 ENCOUNTER — Encounter: Payer: Self-pay | Admitting: Radiation Oncology

## 2018-08-02 VITALS — BP 132/91 | HR 64 | Temp 98.6°F | Resp 20 | Ht 67.75 in | Wt 170.4 lb

## 2018-08-02 DIAGNOSIS — C50211 Malignant neoplasm of upper-inner quadrant of right female breast: Secondary | ICD-10-CM

## 2018-08-02 DIAGNOSIS — Z170 Estrogen receptor positive status [ER+]: Secondary | ICD-10-CM

## 2018-08-02 DIAGNOSIS — M129 Arthropathy, unspecified: Secondary | ICD-10-CM | POA: Diagnosis not present

## 2018-08-02 DIAGNOSIS — Z79899 Other long term (current) drug therapy: Secondary | ICD-10-CM | POA: Diagnosis not present

## 2018-08-02 DIAGNOSIS — Z5111 Encounter for antineoplastic chemotherapy: Secondary | ICD-10-CM | POA: Insufficient documentation

## 2018-08-02 DIAGNOSIS — Z510 Encounter for antineoplastic radiation therapy: Secondary | ICD-10-CM | POA: Diagnosis not present

## 2018-08-02 DIAGNOSIS — Z7689 Persons encountering health services in other specified circumstances: Secondary | ICD-10-CM | POA: Insufficient documentation

## 2018-08-02 DIAGNOSIS — Z803 Family history of malignant neoplasm of breast: Secondary | ICD-10-CM | POA: Diagnosis not present

## 2018-08-02 NOTE — Progress Notes (Signed)
  Radiation Oncology         [phone] ________________________________  Name: Monica [name] MRN: [id number]  Date: 08/02/2018  DOB: 06/19/1971  Optical Surface Tracking Plan:  Since intensity modulated radiotherapy (IMRT) and 3D conformal radiation treatment methods are predicated on accurate and precise positioning for treatment, intrafraction motion monitoring is medically necessary to ensure accurate and safe treatment delivery.  The ability to quantify intrafraction motion without excessive ionizing radiation dose can only be performed with optical surface tracking. Accordingly, surface imaging offers the opportunity to obtain 3D measurements of patient position throughout IMRT and 3D treatments without excessive radiation exposure.  I am ordering optical surface tracking for this patient's upcoming course of radiotherapy. ________________________________  [name], MD 08/02/2018 3:39 PM    Reference:   Particia Jasper, et al. Surface imaging-based analysis of intrafraction motion for breast radiotherapy patients.Journal of [place], n. 6, nov. 2014. ISSN 71696789.   Available at: <http://www.jacmp.org/index.php/jacmp/article/view/4957>.

## 2018-08-02 NOTE — Addendum Note (Signed)
Encounter addended by: Rico Sheehan, LPN on: 3/54/5625 6:38 AM  Actions taken: Charge Capture section accepted

## 2018-08-02 NOTE — Progress Notes (Signed)
  Radiation Oncology         [phone] ________________________________  Name: Monica [name] MRN: [id number]  Date: 08/02/2018  DOB: 1970-08-20  DIAGNOSIS:     ICD-10-CM   1. Malignant neoplasm of upper-inner quadrant of right breast in female, estrogen receptor positive ([place]) C50.211    Z17.0      SIMULATION AND TREATMENT PLANNING NOTE  The patient presented for simulation prior to beginning her course of radiation treatment for her diagnosis of right-sided breast cancer. The patient was placed in a supine position on a breast board. A customized vac-lock bag was constructed and this complex treatment device will be used on a daily basis during her treatment. In this fashion, a CT scan was obtained through the chest area and an isocenter was placed near the chest wall within the breast.  The patient will be planned to receive a course of radiation initially to a dose of 50.4 Gy. This will consist of a whole breast radiotherapy technique. To accomplish this, 2 customized blocks have been designed which will correspond to medial and lateral whole breast tangent fields. This treatment will be accomplished at 1.8 Gy per fraction. A forward planning technique will also be evaluated to determine if this approach improves the plan. It is anticipated that the patient will then receive a 10 Gy boost to the seroma cavity which has been contoured. This will be accomplished at 2 Gy per fraction.   This initial treatment will consist of a 3-D conformal technique. The seroma has been contoured as the primary target structure. Additionally, dose volume histograms of both this target as well as the lungs and heart will also be evaluated. Such an approach is necessary to ensure that the target area is adequately covered while the nearby critical  normal structures are adequately spared.  Plan:  The final anticipated total dose therefore will correspond to 60.4  Gy.    _______________________________   Jodelle Gross, MD, PhD

## 2018-08-02 NOTE — Progress Notes (Signed)
[place] Psychosocial Distress Screening Clinical Social Work  Clinical Social Work was referred by distress screening protocol.  The patient scored a 7 on the Psychosocial Distress Thermometer which indicates moderate distress. Clinical Social Worker met with patient in [place] office to assess for distress and other psychosocial needs.  Patient identified her major concerns as financial and work.  Patients concerns addressed.  Please see previous CSW note.       ONCBCN DISTRESS SCREENING 08/02/2018  Distress experienced in past week (1-10) 7  Practical problem type Housing;Insurance;Work/school  Family Problem type Partner;Children  Emotional problem type Depression;Boredom  Physical Problem type Sleep/insomnia  Referral to clinical social work Yes     Johnnye Lana, MSW, LCSW, OSW-C Clinical Social Worker [facility] [phone]     ;

## 2018-08-02 NOTE — Progress Notes (Signed)
[facility] Clinical Social Work  Clinical Social Worker met with patient in office at [facility] to review financial assistance applications.  CSW and patient reviewed applications for [employer], [employer] Fund, and Cancer Care.  Once applications are completed and all information is returned CSW will submit applications.  CSW Cancer Care application today.     , MSW, LCSW, OSW-C Clinical Social Worker [facility] [phone]            

## 2018-08-02 NOTE — Progress Notes (Signed)
Radiation Oncology         [phone] ________________________________  Name: Monica [name]        MRN: [id number]  Date of Service: 08/02/2018 DOB: 1970/12/04  GU:[id number], Loma Sousa, PA-C  Magrinat, [name], MD     REFERRING PHYSICIAN: Magrinat, [name], MD   DIAGNOSIS: The encounter diagnosis was Malignant neoplasm of upper-inner quadrant of right breast in female, estrogen receptor positive ([place]).   HISTORY OF PRESENT ILLNESS: Monica [name] is a 48 y.o. female originally seen in the multidisciplinary breast clinic for a new diagnosis of right breast cancer. The patient was noted to have a palpable mass along the right upper aspect of the chest. She had a mammogram which did not reveal any visible abnormalities a targeted right breast ultrasound revealed a 1.4 x 1.3 x 1.3 cm mass at 1:00. Her axilla was negative for adenopathy. A biopsy on 12/29/17 revealing a grade 3 invasive ductal carcinoma, ER/PR positive, HER2 amplified with a ratio of 3.44, and a Ki67 of 80%. She proceeded with right breast lumpectomy on 02/16/2018 which revealed a grade 3 invasive ductal carcinoma measuring 2.3 cm with high-grade DCIS.  Her invasive disease was less than 1 mm to the anterior margin, DCIS was focally present as well as the anterior margin and broadly less than 1 mm from the superior margin LVSI was noted.  Additional right excision of the anterior margin and superior margin was negative for additional malignancy.  Additional lateral and medial margins were also taken and no evidence was seen of any cancer.  Along the inferior margin there was high-grade DCIS present at the new margin, and of the 5 lymph nodes sampled, none contained any disease.  On 02/22/2018 she underwent reexcision of the inferior margin and focal fibrocystic changes and lobular neoplasia was noted without any residual carcinoma.  She went on to receive adjuvant chemotherapy between March 11, 2018 and completed this course on 07/18/2018.  She  is to continue Herceptin, and to start tamoxifen following radiotherapy.  She comes today to review the rationale for adjuvant radiation.    PREVIOUS RADIATION THERAPY: No   PAST MEDICAL HISTORY:  Past Medical History:  Diagnosis Date  . Arthritis    lower back  . Cancer [facility])    right breast cancer  . Family history of breast cancer        PAST SURGICAL HISTORY: Past Surgical History:  Procedure Laterality Date  . ABDOMINAL HYSTERECTOMY    . BREAST LUMPECTOMY WITH RADIOACTIVE SEED AND SENTINEL LYMPH NODE BIOPSY Right 02/16/2018   Procedure: RIGHT BREAST LUMPECTOMY WITH RADIOACTIVE SEED AND SENTINEL LYMPH NODE BIOPSY;  Surgeon: [name], MD;  Location: [facility];  Service: General;  Laterality: Right;  . HYMENECTOMY    . PORTACATH PLACEMENT Right 02/16/2018   Procedure: INSERTION PORT-A-CATH;  Surgeon: [name], MD;  Location: [facility];  Service: General;  Laterality: Right;  . RE-EXCISION OF BREAST LUMPECTOMY Right 02/22/2018   Procedure: RE-EXCISION OF RIGHT  BREAST LUMPECTOMY;  Surgeon: [name], MD;  Location: [facility];  Service: General;  Laterality: Right;  . REPAIR VAGINAL CUFF N/A 02/07/2017   Procedure: REPAIR VAGINAL CUFF;  Surgeon: Ena Dawley, MD;  Location: [facility];  Service: Gynecology;  Laterality: N/A;  . ROBOTIC ASSISTED TOTAL HYSTERECTOMY WITH SALPINGECTOMY Left 01/20/2017   Procedure: ROBOTIC ASSISTED TOTAL HYSTERECTOMY WITH SALPINGECTOMY;  Surgeon: [name], MD;  Location: [facility];  Service: Gynecology;  Laterality: Left;  FAMILY HISTORY:  Family History  Problem Relation Age of Onset  . Lung cancer Maternal Grandfather   . Esophageal cancer Paternal Grandfather 6  . Breast cancer Cousin 31     SOCIAL HISTORY:  reports that she has never smoked. She has never used smokeless tobacco. She reports current alcohol use. She reports that she does not use drugs. The patient is separated  and lives in [place]. She has lived in [place] for 5 years. She has children who are 17 and 21. She works as an [employer] loss adjuster.    ALLERGIES: Patient has no known allergies.   MEDICATIONS:  Current Outpatient Medications  Medication Sig Dispense Refill  . Acetaminophen (TYLENOL PO) Take by mouth 3 (three) times daily. 2 tabs as needed for aches and pains    . ibuprofen (ADVIL,MOTRIN) 800 MG tablet Take 1 tablet (800 mg total) by mouth every 8 (eight) hours as needed. 30 tablet 0  . lidocaine-prilocaine (EMLA) cream Apply to affected area once 30 g 3  . loratadine (CLARITIN) 10 MG tablet Take 10 mg by mouth daily.    Marland Kitchen NAPROXEN PO Take by mouth as needed. 1 tab as needed for aches and pain    . Probiotic Product (PROBIOTIC PO) Take by mouth daily.    . tamoxifen (NOLVADEX) 20 MG tablet Take 1 tablet (20 mg total) by mouth daily for 30 days. 90 tablet 12  . venlafaxine XR (EFFEXOR-XR) 37.5 MG 24 hr capsule Take 1 capsule (37.5 mg total) by mouth daily with breakfast. 90 capsule 4   No current facility-administered medications for this visit.      REVIEW OF SYSTEMS: On review of systems, the patient reports that she is doing well overall. She reports she did quite well with chemotherapy. She denies any neuropathy in fingertips or toes at this time. She denies any chest pain, shortness of breath, cough, fevers, chills, night sweats, unintended weight changes. She denies any bowel or bladder disturbances, and denies abdominal pain, nausea or vomiting. She denies any new musculoskeletal or joint aches or pains. A complete review of systems is obtained and is otherwise negative.     PHYSICAL EXAM:  Wt Readings from Last 3 Encounters:  07/25/18 174 lb 9.6 oz (79.2 kg)  07/18/18 165 lb 8 oz (75.1 kg)  06/20/18 169 lb 9.6 oz (76.9 kg)   Temp Readings from Last 3 Encounters:  07/25/18 98.6 F (37 C) (Oral)  07/18/18 98.5 F (36.9 C) (Oral)  06/20/18 98.5 F (36.9 C) (Oral)   BP  Readings from Last 3 Encounters:  07/25/18 (!) 135/97  07/18/18 114/80  06/20/18 121/86   Pulse Readings from Last 3 Encounters:  07/25/18 62  07/18/18 66  06/20/18 69     In general this is a well appearing Caucasian female in no acute distress. She is alert and oriented x4 and appropriate throughout the examination. HEENT reveals that the patient is normocephalic, atraumatic. EOMs are intact.  Skin is intact without any evidence of gross lesions. Cardiovascular exam reveals a regular rate and rhythm, no clicks rubs or murmurs are auscultated. Cardiopulmonary assessment is negative for acute distress and she exhibits normal effort. Her right lumpectomy site is intact and high in the right breast, about 5 cm below her right sided PAC.    ECOG = 0  0 - Asymptomatic (Fully active, able to carry on all predisease activities without restriction)  1 - Symptomatic but completely ambulatory (Restricted in physically strenuous activity but ambulatory and  able to carry out work of a light or sedentary nature. For example, light housework, office work)  2 - Symptomatic, <50% in bed during the day (Ambulatory and capable of all self care but unable to carry out any work activities. Up and about more than 50% of waking hours)  3 - Symptomatic, >50% in bed, but not bedbound (Capable of only limited self-care, confined to bed or chair 50% or more of waking hours)  4 - Bedbound (Completely disabled. Cannot carry on any self-care. Totally confined to bed or chair)  5 - Death   Eustace Pen MM, Creech RH, Tormey DC, et al. [phone]). "Toxicity and response criteria of the [facility]". [place] Oncol. 5 (6): 649-55    LABORATORY DATA:  Lab Results  Component Value Date   WBC 18.7 (H) 07/25/2018   HGB 12.4 07/25/2018   HCT 38.8 07/25/2018   MCV 101.6 (H) 07/25/2018   PLT 237 07/25/2018   Lab Results  Component Value Date   NA 140 07/25/2018   K 4.1 07/25/2018   CL 103  07/25/2018   CO2 27 07/25/2018   Lab Results  Component Value Date   ALT 27 07/25/2018   AST 19 07/25/2018   ALKPHOS 102 07/25/2018   BILITOT 0.3 07/25/2018      RADIOGRAPHY: No results found.     IMPRESSION/PLAN: 1. Stage IB, [id number] grade 3, triple positive invasive ductal carcinoma of the right breast. Dr. [name] discusses the final pathology findings and reviews her course. She has healed well and completed her systemic chemotherapy. She will continue Herceptin. She is ready to proceed with external radiotherapy to the breast followed by antiestrogen therapy. We reviewed the rationale for adjuvant radiotherapy. We discussed the risks, benefits, short, and long term effects of radiotherapy, and the patient is interested in proceeding. Dr. [name] discusses the delivery and logistics of radiotherapy and anticipates a course of 6 1/2 weeks of radiotherapy. Written consent is obtained and placed in the chart, a copy was provided to the patient. She will simulate today following this appointment and we anticipate starting radiation the first week of February 2020.  In a visit lasting 25 minutes, greater than 50% of the time was spent face to face discussing her case, and coordinating the patient's care.  The above documentation reflects my direct findings during this shared patient visit. Please see the separate note by Dr. [name] on this date for the remainder of the patient's plan of care.    Carola Rhine, PAC

## 2018-08-05 DIAGNOSIS — Z510 Encounter for antineoplastic radiation therapy: Secondary | ICD-10-CM | POA: Diagnosis not present

## 2018-08-08 ENCOUNTER — Inpatient Hospital Stay: Payer: Managed Care, Other (non HMO)

## 2018-08-08 ENCOUNTER — Other Ambulatory Visit: Payer: Self-pay | Admitting: Oncology

## 2018-08-08 ENCOUNTER — Ambulatory Visit (HOSPITAL_BASED_OUTPATIENT_CLINIC_OR_DEPARTMENT_OTHER): Payer: Managed Care, Other (non HMO) | Admitting: Medical

## 2018-08-08 VITALS — BP 142/80 | HR 66 | Temp 98.3°F | Resp 18

## 2018-08-08 DIAGNOSIS — Z95828 Presence of other vascular implants and grafts: Secondary | ICD-10-CM

## 2018-08-08 DIAGNOSIS — C50211 Malignant neoplasm of upper-inner quadrant of right female breast: Secondary | ICD-10-CM

## 2018-08-08 DIAGNOSIS — Z170 Estrogen receptor positive status [ER+]: Secondary | ICD-10-CM

## 2018-08-08 DIAGNOSIS — Z510 Encounter for antineoplastic radiation therapy: Secondary | ICD-10-CM | POA: Diagnosis not present

## 2018-08-08 DIAGNOSIS — H00014 Hordeolum externum left upper eyelid: Secondary | ICD-10-CM

## 2018-08-08 LAB — CMP (CANCER CENTER ONLY)
ALT: 21 U/L (ref 0–44)
AST: 15 U/L (ref 15–41)
Albumin: 3.8 g/dL (ref 3.5–5.0)
Alkaline Phosphatase: 63 U/L (ref 38–126)
Anion gap: 8 (ref 5–15)
BUN: 11 mg/dL (ref 6–20)
CO2: 29 mmol/L (ref 22–32)
Calcium: 9.3 mg/dL (ref 8.9–10.3)
Chloride: 105 mmol/L (ref 98–111)
Creatinine: 0.7 mg/dL (ref 0.44–1.00)
GFR, Est AFR Am: >60 mL/min (ref >60)
GFR, Estimated: >60 mL/min (ref >60)
Glucose, Bld: 93 mg/dL (ref 70–99)
Potassium: 3.8 mmol/L (ref 3.5–5.1)
Sodium: 142 mmol/L (ref 135–145)
Total Bilirubin: 0.3 mg/dL (ref 0.3–1.2)
Total Protein: 6.6 g/dL (ref 6.5–8.1)

## 2018-08-08 LAB — CBC WITH DIFFERENTIAL (CANCER CENTER ONLY)
Abs Immature Granulocytes: 0.06 10*3/uL (ref 0.00–0.07)
Basophils Absolute: 0 10*3/uL (ref 0.0–0.1)
Basophils Relative: 0 %
Eosinophils Absolute: 0 10*3/uL (ref 0.0–0.5)
Eosinophils Relative: 1 %
HCT: 34.8 % — ABNORMAL LOW (ref 36.0–46.0)
Hemoglobin: 11.2 g/dL — ABNORMAL LOW (ref 12.0–15.0)
Immature Granulocytes: 1 %
Lymphocytes Relative: 18 %
Lymphs Abs: 1.4 10*3/uL (ref 0.7–4.0)
MCH: 32.5 pg (ref 26.0–34.0)
MCHC: 32.2 g/dL (ref 30.0–36.0)
MCV: 100.9 fL — ABNORMAL HIGH (ref 80.0–100.0)
Monocytes Absolute: 0.6 10*3/uL (ref 0.1–1.0)
Monocytes Relative: 8 %
Neutro Abs: 5.8 10*3/uL (ref 1.7–7.7)
Neutrophils Relative %: 72 %
Platelet Count: 233 10*3/uL (ref 150–400)
RBC: 3.45 MIL/uL — ABNORMAL LOW (ref 3.87–5.11)
RDW: 11.9 % (ref 11.5–15.5)
WBC Count: 7.9 10*3/uL (ref 4.0–10.5)
nRBC: 0 % (ref 0.0–0.2)

## 2018-08-08 MED ORDER — DIPHENHYDRAMINE HCL 12.5 MG/5ML PO ELIX
ORAL_SOLUTION | ORAL | Status: AC
  Filled 2018-08-08: qty 5

## 2018-08-08 MED ORDER — ACETAMINOPHEN 325 MG PO TABS
ORAL_TABLET | ORAL | Status: AC
  Filled 2018-08-08: qty 2

## 2018-08-08 MED ORDER — SODIUM CHLORIDE 0.9% FLUSH
10.0000 mL | Freq: Once | INTRAVENOUS | Status: AC
  Administered 2018-08-08: 10 mL
  Filled 2018-08-08: qty 10

## 2018-08-08 MED ORDER — HEPARIN SOD (PORK) LOCK FLUSH 100 UNIT/ML IV SOLN
500.0000 [IU] | Freq: Once | INTRAVENOUS | Status: AC | PRN
  Administered 2018-08-08: 500 [IU]
  Filled 2018-08-08: qty 5

## 2018-08-08 MED ORDER — SODIUM CHLORIDE 0.9 % IV SOLN
Freq: Once | INTRAVENOUS | Status: AC
  Administered 2018-08-08: 13:00:00 via INTRAVENOUS
  Filled 2018-08-08: qty 250

## 2018-08-08 MED ORDER — SODIUM CHLORIDE 0.9% FLUSH
10.0000 mL | INTRAVENOUS | Status: DC | PRN
  Administered 2018-08-08: 10 mL
  Filled 2018-08-08: qty 10

## 2018-08-08 MED ORDER — ACETAMINOPHEN 325 MG PO TABS
650.0000 mg | ORAL_TABLET | Freq: Once | ORAL | Status: AC
  Administered 2018-08-08: 650 mg via ORAL

## 2018-08-08 MED ORDER — TRASTUZUMAB CHEMO 150 MG IV SOLR
450.0000 mg | Freq: Once | INTRAVENOUS | Status: AC
  Administered 2018-08-08: 450 mg via INTRAVENOUS
  Filled 2018-08-08: qty 21.43

## 2018-08-08 MED ORDER — DIPHENHYDRAMINE HCL 12.5 MG/5ML PO ELIX
12.5000 mg | ORAL_SOLUTION | Freq: Once | ORAL | Status: AC
  Administered 2018-08-08: 12.5 mg via ORAL

## 2018-08-08 NOTE — Patient Instructions (Signed)
[facility] Discharge Instructions for Patients Receiving Chemotherapy  Today you received the following chemotherapy agents Herceptin  To help prevent nausea and vomiting after your treatment, we encourage you to take your nausea medication as directed   If you develop nausea and vomiting that is not controlled by your nausea medication, call the clinic.   BELOW ARE SYMPTOMS THAT SHOULD BE REPORTED IMMEDIATELY:  *FEVER GREATER THAN 100.5 F  *CHILLS WITH OR WITHOUT FEVER  NAUSEA AND VOMITING THAT IS NOT CONTROLLED WITH YOUR NAUSEA MEDICATION  *UNUSUAL SHORTNESS OF BREATH  *UNUSUAL BRUISING OR BLEEDING  TENDERNESS IN MOUTH AND THROAT WITH OR WITHOUT PRESENCE OF ULCERS  *URINARY PROBLEMS  *BOWEL PROBLEMS  UNUSUAL RASH Items with * indicate a potential emergency and should be followed up as soon as possible.  Feel free to call the clinic should you have any questions or concerns. The clinic phone number is [phone].  Please show the CHEMO ALERT CARD at check-in to the Emergency Department and triage nurse.   

## 2018-08-11 NOTE — Progress Notes (Signed)
The patient was seen in infusion. She believes that she is developing a sty of the upper left eyelid. She denies matting or visual changes. She was told to use warm moist compresses to her eye as needed and to either contact our office or her PCP should her condition worsen. She expresses understanding and agreement with this plan.  Sandi Mealy, MHS, PA-C Physician Assistant

## 2018-08-15 ENCOUNTER — Ambulatory Visit
Admission: RE | Admit: 2018-08-15 | Discharge: 2018-08-15 | Disposition: A | Discharge status: Home / Self Care | Payer: Managed Care, Other (non HMO) | Source: Ambulatory Visit | Attending: Radiation Oncology | Admitting: Radiation Oncology

## 2018-08-15 DIAGNOSIS — Z510 Encounter for antineoplastic radiation therapy: Secondary | ICD-10-CM | POA: Insufficient documentation

## 2018-08-15 DIAGNOSIS — Z170 Estrogen receptor positive status [ER+]: Secondary | ICD-10-CM | POA: Insufficient documentation

## 2018-08-15 DIAGNOSIS — C50211 Malignant neoplasm of upper-inner quadrant of right female breast: Secondary | ICD-10-CM | POA: Insufficient documentation

## 2018-08-15 DIAGNOSIS — Z79899 Other long term (current) drug therapy: Secondary | ICD-10-CM | POA: Diagnosis not present

## 2018-08-15 DIAGNOSIS — R232 Flushing: Secondary | ICD-10-CM | POA: Diagnosis not present

## 2018-08-15 DIAGNOSIS — Z90710 Acquired absence of both cervix and uterus: Secondary | ICD-10-CM | POA: Diagnosis not present

## 2018-08-15 DIAGNOSIS — Z5112 Encounter for antineoplastic immunotherapy: Secondary | ICD-10-CM | POA: Diagnosis not present

## 2018-08-16 ENCOUNTER — Ambulatory Visit
Admission: RE | Admit: 2018-08-16 | Discharge: 2018-08-16 | Disposition: A | Discharge status: Home / Self Care | Payer: Managed Care, Other (non HMO) | Source: Ambulatory Visit | Attending: Radiation Oncology | Admitting: Radiation Oncology

## 2018-08-16 DIAGNOSIS — Z5112 Encounter for antineoplastic immunotherapy: Secondary | ICD-10-CM | POA: Diagnosis not present

## 2018-08-17 ENCOUNTER — Ambulatory Visit
Admission: RE | Admit: 2018-08-17 | Discharge: 2018-08-17 | Disposition: A | Discharge status: Home / Self Care | Payer: Managed Care, Other (non HMO) | Source: Ambulatory Visit | Attending: Radiation Oncology | Admitting: Radiation Oncology

## 2018-08-17 DIAGNOSIS — Z5112 Encounter for antineoplastic immunotherapy: Secondary | ICD-10-CM | POA: Diagnosis not present

## 2018-08-18 ENCOUNTER — Ambulatory Visit
Admission: RE | Admit: 2018-08-18 | Discharge: 2018-08-18 | Disposition: A | Discharge status: Home / Self Care | Payer: Managed Care, Other (non HMO) | Source: Ambulatory Visit | Attending: Radiation Oncology | Admitting: Radiation Oncology

## 2018-08-18 DIAGNOSIS — Z5112 Encounter for antineoplastic immunotherapy: Secondary | ICD-10-CM | POA: Diagnosis not present

## 2018-08-19 ENCOUNTER — Ambulatory Visit
Admission: RE | Admit: 2018-08-19 | Discharge: 2018-08-19 | Disposition: A | Discharge status: Home / Self Care | Payer: Managed Care, Other (non HMO) | Source: Ambulatory Visit | Attending: Radiation Oncology | Admitting: Radiation Oncology

## 2018-08-19 DIAGNOSIS — Z5112 Encounter for antineoplastic immunotherapy: Secondary | ICD-10-CM | POA: Diagnosis not present

## 2018-08-22 ENCOUNTER — Ambulatory Visit
Admission: RE | Admit: 2018-08-22 | Discharge: 2018-08-22 | Disposition: A | Discharge status: Home / Self Care | Payer: Managed Care, Other (non HMO) | Source: Ambulatory Visit | Attending: Radiation Oncology | Admitting: Radiation Oncology

## 2018-08-22 DIAGNOSIS — Z5112 Encounter for antineoplastic immunotherapy: Secondary | ICD-10-CM | POA: Diagnosis not present

## 2018-08-23 ENCOUNTER — Ambulatory Visit
Admission: RE | Admit: 2018-08-23 | Discharge: 2018-08-23 | Disposition: A | Discharge status: Home / Self Care | Payer: Managed Care, Other (non HMO) | Source: Ambulatory Visit | Attending: Radiation Oncology | Admitting: Radiation Oncology

## 2018-08-23 DIAGNOSIS — Z5112 Encounter for antineoplastic immunotherapy: Secondary | ICD-10-CM | POA: Diagnosis not present

## 2018-08-24 ENCOUNTER — Ambulatory Visit
Admission: RE | Admit: 2018-08-24 | Discharge: 2018-08-24 | Disposition: A | Discharge status: Home / Self Care | Payer: Managed Care, Other (non HMO) | Source: Ambulatory Visit | Attending: Radiation Oncology | Admitting: Radiation Oncology

## 2018-08-24 DIAGNOSIS — Z5112 Encounter for antineoplastic immunotherapy: Secondary | ICD-10-CM | POA: Diagnosis not present

## 2018-08-25 ENCOUNTER — Ambulatory Visit
Admission: RE | Admit: 2018-08-25 | Discharge: 2018-08-25 | Disposition: A | Discharge status: Home / Self Care | Payer: Managed Care, Other (non HMO) | Source: Ambulatory Visit | Attending: Radiation Oncology | Admitting: Radiation Oncology

## 2018-08-25 DIAGNOSIS — Z5112 Encounter for antineoplastic immunotherapy: Secondary | ICD-10-CM | POA: Diagnosis not present

## 2018-08-26 ENCOUNTER — Ambulatory Visit
Admission: RE | Admit: 2018-08-26 | Discharge: 2018-08-26 | Disposition: A | Discharge status: Home / Self Care | Payer: Managed Care, Other (non HMO) | Source: Ambulatory Visit | Attending: Radiation Oncology | Admitting: Radiation Oncology

## 2018-08-26 DIAGNOSIS — Z5112 Encounter for antineoplastic immunotherapy: Secondary | ICD-10-CM | POA: Diagnosis not present

## 2018-08-26 DIAGNOSIS — Z170 Estrogen receptor positive status [ER+]: Secondary | ICD-10-CM

## 2018-08-26 DIAGNOSIS — C50211 Malignant neoplasm of upper-inner quadrant of right female breast: Secondary | ICD-10-CM

## 2018-08-26 MED ORDER — RADIAPLEXRX EX GEL
Freq: Once | CUTANEOUS | Status: AC
  Administered 2018-08-26: 18:00:00 via TOPICAL

## 2018-08-26 MED ORDER — ALRA NON-METALLIC DEODORANT (RAD-ONC)
1.0000 "application " | Freq: Once | TOPICAL | Status: AC
  Administered 2018-08-26: 1 via TOPICAL

## 2018-08-26 NOTE — Progress Notes (Signed)
Pt here for patient teaching.  Pt given Radiation and You booklet, skin care instructions, Alra deodorant and Radiaplex gel.  Reviewed areas of pertinence such as fatigue, hair loss, skin changes, breast tenderness and breast swelling . Pt able to give teach back of to pat skin and use unscented/gentle soap,apply Radiaplex bid, avoid applying anything to skin within 4 hours of treatment, avoid wearing an under wire bra and to use an electric razor if they must shave. Pt verbalizes understanding of information given and will contact nursing with any questions or concerns.     Poppi Scantling M. Raul Winterhalter RN, BSN      

## 2018-08-29 ENCOUNTER — Inpatient Hospital Stay (HOSPITAL_BASED_OUTPATIENT_CLINIC_OR_DEPARTMENT_OTHER): Payer: Managed Care, Other (non HMO) | Admitting: Adult Health

## 2018-08-29 ENCOUNTER — Encounter: Payer: Self-pay | Admitting: Adult Health

## 2018-08-29 ENCOUNTER — Ambulatory Visit
Admission: RE | Admit: 2018-08-29 | Discharge: 2018-08-29 | Disposition: A | Discharge status: Home / Self Care | Payer: Managed Care, Other (non HMO) | Source: Ambulatory Visit | Attending: Radiation Oncology | Admitting: Radiation Oncology

## 2018-08-29 ENCOUNTER — Inpatient Hospital Stay: Payer: Managed Care, Other (non HMO) | Attending: Oncology

## 2018-08-29 ENCOUNTER — Encounter: Payer: Self-pay | Admitting: Radiation Oncology

## 2018-08-29 ENCOUNTER — Inpatient Hospital Stay: Payer: Managed Care, Other (non HMO)

## 2018-08-29 VITALS — BP 137/87 | HR 61 | Temp 98.4°F | Resp 18 | Ht 67.75 in | Wt 171.8 lb

## 2018-08-29 DIAGNOSIS — C50211 Malignant neoplasm of upper-inner quadrant of right female breast: Secondary | ICD-10-CM

## 2018-08-29 DIAGNOSIS — Z79899 Other long term (current) drug therapy: Secondary | ICD-10-CM | POA: Insufficient documentation

## 2018-08-29 DIAGNOSIS — Z5112 Encounter for antineoplastic immunotherapy: Secondary | ICD-10-CM | POA: Diagnosis not present

## 2018-08-29 DIAGNOSIS — R232 Flushing: Secondary | ICD-10-CM | POA: Diagnosis not present

## 2018-08-29 DIAGNOSIS — Z170 Estrogen receptor positive status [ER+]: Secondary | ICD-10-CM

## 2018-08-29 DIAGNOSIS — Z90710 Acquired absence of both cervix and uterus: Secondary | ICD-10-CM

## 2018-08-29 LAB — CMP (CANCER CENTER ONLY)
ALT: 12 U/L (ref 0–44)
AST: 16 U/L (ref 15–41)
Albumin: 3.9 g/dL (ref 3.5–5.0)
Alkaline Phosphatase: 60 U/L (ref 38–126)
Anion gap: 9 (ref 5–15)
BUN: 9 mg/dL (ref 6–20)
CO2: 26 mmol/L (ref 22–32)
Calcium: 9.5 mg/dL (ref 8.9–10.3)
Chloride: 106 mmol/L (ref 98–111)
Creatinine: 0.78 mg/dL (ref 0.44–1.00)
GFR, Est AFR Am: >60 mL/min (ref >60)
GFR, Estimated: >60 mL/min (ref >60)
Glucose, Bld: 84 mg/dL (ref 70–99)
Potassium: 4 mmol/L (ref 3.5–5.1)
Sodium: 141 mmol/L (ref 135–145)
Total Bilirubin: 0.4 mg/dL (ref 0.3–1.2)
Total Protein: 6.9 g/dL (ref 6.5–8.1)

## 2018-08-29 LAB — CBC WITH DIFFERENTIAL (CANCER CENTER ONLY)
Abs Immature Granulocytes: 0.01 10*3/uL (ref 0.00–0.07)
Basophils Absolute: 0 10*3/uL (ref 0.0–0.1)
Basophils Relative: 1 %
Eosinophils Absolute: 0.1 10*3/uL (ref 0.0–0.5)
Eosinophils Relative: 2 %
HCT: 37.5 % (ref 36.0–46.0)
Hemoglobin: 12 g/dL (ref 12.0–15.0)
Immature Granulocytes: 0 %
Lymphocytes Relative: 19 %
Lymphs Abs: 1 10*3/uL (ref 0.7–4.0)
MCH: 31.9 pg (ref 26.0–34.0)
MCHC: 32 g/dL (ref 30.0–36.0)
MCV: 99.7 fL (ref 80.0–100.0)
Monocytes Absolute: 0.6 10*3/uL (ref 0.1–1.0)
Monocytes Relative: 11 %
Neutro Abs: 3.4 10*3/uL (ref 1.7–7.7)
Neutrophils Relative %: 67 %
Platelet Count: 203 10*3/uL (ref 150–400)
RBC: 3.76 MIL/uL — ABNORMAL LOW (ref 3.87–5.11)
RDW: 11.2 % — ABNORMAL LOW (ref 11.5–15.5)
WBC Count: 5.1 10*3/uL (ref 4.0–10.5)
nRBC: 0 % (ref 0.0–0.2)

## 2018-08-29 MED ORDER — TRASTUZUMAB CHEMO 150 MG IV SOLR
450.0000 mg | Freq: Once | INTRAVENOUS | Status: AC
  Administered 2018-08-29: 450 mg via INTRAVENOUS
  Filled 2018-08-29: qty 21.43

## 2018-08-29 MED ORDER — ACETAMINOPHEN 325 MG PO TABS
ORAL_TABLET | ORAL | Status: AC
  Filled 2018-08-29: qty 1

## 2018-08-29 MED ORDER — SODIUM CHLORIDE 0.9% FLUSH
10.0000 mL | INTRAVENOUS | Status: DC | PRN
  Administered 2018-08-29: 10 mL
  Filled 2018-08-29: qty 10

## 2018-08-29 MED ORDER — ACETAMINOPHEN 325 MG PO TABS
650.0000 mg | ORAL_TABLET | Freq: Once | ORAL | Status: AC
  Administered 2018-08-29: 650 mg via ORAL

## 2018-08-29 MED ORDER — SODIUM CHLORIDE 0.9 % IV SOLN
Freq: Once | INTRAVENOUS | Status: AC
  Administered 2018-08-29: 10:00:00 via INTRAVENOUS
  Filled 2018-08-29: qty 250

## 2018-08-29 MED ORDER — DIPHENHYDRAMINE HCL 12.5 MG/5ML PO ELIX
ORAL_SOLUTION | ORAL | Status: AC
  Filled 2018-08-29: qty 5

## 2018-08-29 MED ORDER — DIPHENHYDRAMINE HCL 12.5 MG/5ML PO ELIX
12.5000 mg | ORAL_SOLUTION | Freq: Once | ORAL | Status: AC
  Administered 2018-08-29: 12.5 mg via ORAL

## 2018-08-29 MED ORDER — HEPARIN SOD (PORK) LOCK FLUSH 100 UNIT/ML IV SOLN
500.0000 [IU] | Freq: Once | INTRAVENOUS | Status: AC | PRN
  Administered 2018-08-29: 500 [IU]
  Filled 2018-08-29: qty 5

## 2018-08-29 NOTE — Patient Instructions (Signed)
[facility] Discharge Instructions for Patients Receiving Chemotherapy  Today you received the following chemotherapy agents Herceptin  To help prevent nausea and vomiting after your treatment, we encourage you to take your nausea medication as directed   If you develop nausea and vomiting that is not controlled by your nausea medication, call the clinic.   BELOW ARE SYMPTOMS THAT SHOULD BE REPORTED IMMEDIATELY:  *FEVER GREATER THAN 100.5 F  *CHILLS WITH OR WITHOUT FEVER  NAUSEA AND VOMITING THAT IS NOT CONTROLLED WITH YOUR NAUSEA MEDICATION  *UNUSUAL SHORTNESS OF BREATH  *UNUSUAL BRUISING OR BLEEDING  TENDERNESS IN MOUTH AND THROAT WITH OR WITHOUT PRESENCE OF ULCERS  *URINARY PROBLEMS  *BOWEL PROBLEMS  UNUSUAL RASH Items with * indicate a potential emergency and should be followed up as soon as possible.  Feel free to call the clinic should you have any questions or concerns. The clinic phone number is [phone].  Please show the CHEMO ALERT CARD at check-in to the Emergency Department and triage nurse.   

## 2018-08-29 NOTE — Progress Notes (Signed)
I met with [name] today and discussed the [employer] grant.  She would like to apply and she will bring in her income info.   °

## 2018-08-29 NOTE — Progress Notes (Signed)
[place]  [phone] [phone]     ID: Monica [name] DOB: 12/18/1970  MR#: [id number]  HFW#:[id number]  Patient Care Team: [name], PA-C as PCP - General (Family Medicine) [name], MD as Consulting Physician (General Surgery) Magrinat, [name], MD as Consulting Physician (Oncology) [name], MD as Consulting Physician (Radiation Oncology) [name], MD as Consulting Physician (Obstetrics and Gynecology) Larey Dresser, MD as Consulting Physician (Cardiology) OTHER MD: [facility]  CHIEF COMPLAINT: triple positive breast cancer  CURRENT TREATMENT: Completed adjuvant chemo-immunotherapy, continuing trastuzumab; adjuvant radiation pending  HISTORY OF CURRENT ILLNESS: From the original intake note:  Monica [name] (pronounced "uric") palpated a right supraclavicular mass and felt tenderness after stretching one day. She brought this to her PA's attention.  The patient underwent bilateral diagnostic mammography with tomography and right breast ultrasonography at [facility] on 12/20/2017 showing: breast density category C. There was a suspicious palpable mass at the 1 o'clock position upper inner quadrant measuring 1.4 x 1.3 x 1.3 cm located 12 cm from the nipple. There was an additional mass at the 8 o'clock radiant measuring 1.2 x 1.0 x 1.4 cm, which likely represented a cyst. Ultrasound evaluation of the right axilla demonstrates no evidence of lymphadenopathy  Accordingly on 12/29/2017 she proceeded to biopsy of the right breast area in question. The pathology from this procedure showed ([id number]): Invasive ductal carcinoma, grade III. Prognostic indicators significant for: estrogen receptor, 80% positive and progesterone receptor, 70% positive, both with strong staining intensity. Proliferation marker Ki67 at 80%. HER2 amplified with ratios HER2/CEP17 signals 3.44 and average HER2 copies per cell  10.65.  The patient's subsequent history is as detailed below.  INTERVAL HISTORY: Monica [name] returns today for follow-up and treatment of her triple positive breast cancer. She is unaccompanied today.  The patient completed 6 cycles of carboplatin, docetaxel, trastuzumab, and pertuzumab on 07/18/2018. She is now completing her final dose of Trastuzumab today.  She is tolerating the treatment well.  She did have h/o hot flashes.  She has tried Gabapentin which didn't work, and then Venlafaxine, but she didn't like how it made her feel.  She prefers to deal with the hot flashes instead.  Monica [name] is also undergoing adjuvant radiation.  She will complete this in March of this year.  She is tolerating this well and has some mild skin redness, but nothing that she notes as being bothersome.     REVIEW OF SYSTEMS: Monica [name] feels well today.  She continues to work Wednesday through Fridays.  She is looking for a new job.  She is having difficulty with her boss at her current job.  She denies any headaches, vision changes, mouth ulcerations, dysphagia, indigestion.  She is without nausea, vomiting, bowel/bladder changes.  She hasn't noted chest pain, palpitations, cough, or shortness of breath.  Her port has been working well.  A detailed ROS was otherwise non contributory.     PAST MEDICAL HISTORY: Past Medical History:  Diagnosis Date  . Arthritis    lower back  . Cancer [facility])    right breast cancer  . Family history of breast cancer   She notes that she had a heart murmur as a child, which she grew out of.   PAST SURGICAL HISTORY: Past Surgical History:  Procedure Laterality Date  . ABDOMINAL HYSTERECTOMY    . BREAST LUMPECTOMY WITH RADIOACTIVE SEED AND SENTINEL LYMPH NODE BIOPSY Right 02/16/2018   Procedure: RIGHT BREAST LUMPECTOMY WITH RADIOACTIVE SEED AND  SENTINEL LYMPH NODE BIOPSY;  Surgeon: [name], MD;  Location: [place];  Service: General;  Laterality: Right;  .  HYMENECTOMY    . PORTACATH PLACEMENT Right 02/16/2018   Procedure: INSERTION PORT-A-CATH;  Surgeon: [name], MD;  Location: [place];  Service: General;  Laterality: Right;  . RE-EXCISION OF BREAST LUMPECTOMY Right 02/22/2018   Procedure: RE-EXCISION OF RIGHT  BREAST LUMPECTOMY;  Surgeon: [name], MD;  Location: [place];  Service: General;  Laterality: Right;  . REPAIR VAGINAL CUFF N/A 02/07/2017   Procedure: REPAIR VAGINAL CUFF;  Surgeon: Ena Dawley, MD;  Location: [facility];  Service: Gynecology;  Laterality: N/A;  . ROBOTIC ASSISTED TOTAL HYSTERECTOMY WITH SALPINGECTOMY Left 01/20/2017   Procedure: ROBOTIC ASSISTED TOTAL HYSTERECTOMY WITH SALPINGECTOMY;  Surgeon: [name], MD;  Location: [facility];  Service: Gynecology;  Laterality: Left;  Partial Hysterectomy without BSO  FAMILY HISTORY Family History  Problem Relation Age of Onset  . Lung cancer Maternal Grandfather   . Esophageal cancer Paternal Grandfather 23  . Breast cancer Cousin 31   As of July 2019, the patient's father is alive at 57. The patient's mother is also alive at 74. The patient has 1 brother and 5 sisters. There was a maternal grandfather diagnosed with lung cancer at 16. There was a paternal grandfather diagnosed with esophageal cancer at 98. The patient's father had a pre-cancerous esophageal finding at age 57. There was also a maternal 1st cousin diagnosed with metastatic breast cancer at age 63.    GYNECOLOGIC HISTORY:  Patient's last menstrual period was 12/21/2016 (exact date). Menarche: 48 years old Age at first live birth: 48 years old She is GXP2. She is status post partial hysterectomy without BSO in 2018 She never used HRT. She used oral contraception over 21 years ago with no complications.   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 The patient is separated from her husband, Monica [name]. The  patient's son Monica [name] age 56 is in the [employer] and is being used stationed in [place] for 3 years beginning January 2020. He plans on working in the railroad industry after he returns. The patient's daughter Monica [name] age 51, works at [employer].      ADVANCED DIRECTIVES: Not in place   HEALTH MAINTENANCE: Social History   Tobacco Use  . Smoking status: Never Smoker  . Smokeless tobacco: Never Used  Substance Use Topics  . Alcohol use: Yes    Comment: occ  . Drug use: No     Colonoscopy:   PAP: 2018/ prior to hysterectomy  Bone density:   No Known Allergies  Current Outpatient Medications  Medication Sig Dispense Refill  . Acetaminophen (TYLENOL PO) Take by mouth 3 (three) times daily. 2 tabs as needed for aches and pains    . ibuprofen (ADVIL,MOTRIN) 800 MG tablet Take 1 tablet (800 mg total) by mouth every 8 (eight) hours as needed. 30 tablet 0  . lidocaine-prilocaine (EMLA) cream Apply to affected area once 30 g 3  . loratadine (CLARITIN) 10 MG tablet Take 10 mg by mouth daily.    Marland Kitchen NAPROXEN PO Take by mouth as needed. 1 tab as needed for aches and pain    . Probiotic Product (PROBIOTIC PO) Take by mouth daily.    Marland Kitchen venlafaxine XR (EFFEXOR-XR) 37.5 MG 24 hr capsule Take 1 capsule (37.5 mg total) by mouth daily with breakfast. (Patient not taking: Reported on  08/29/2018) 90 capsule 4   No current facility-administered medications for this visit.     OBJECTIVE:   Vitals:   08/29/18 0839  BP: 137/87  Pulse: 61  Resp: 18  Temp: 98.4 F (36.9 C)  SpO2: 100%     Body mass index is 26.32 kg/m.   Wt Readings from Last 3 Encounters:  08/29/18 171 lb 12.8 oz (77.9 kg)  08/02/18 170 lb 6.4 oz (77.3 kg)  07/25/18 174 lb 9.6 oz (79.2 kg)  ECOG FS:1 GENERAL: Patient is a well appearing female in no acute distress HEENT:  Sclerae anicteric.  Oropharynx clear and moist. No ulcerations or evidence of oropharyngeal candidiasis. Neck is supple.  NODES:  No cervical,  supraclavicular, or axillary lymphadenopathy palpated.  BREAST EXAM:  Right breast s/p lumpectomy, slight erythema from adjuvant radiation, no sign of local recurrence, left breast benign LUNGS:  Clear to auscultation bilaterally.  No wheezes or rhonchi. HEART:  Regular rate and rhythm. No murmur appreciated. ABDOMEN:  Soft, nontender.  Positive, normoactive bowel sounds. No organomegaly palpated. MSK:  No focal spinal tenderness to palpation. Full range of motion bilaterally in the upper extremities. EXTREMITIES:  No peripheral edema.   SKIN:  Clear with no obvious rashes or skin changes. No nail dyscrasia. NEURO:  Nonfocal. Well oriented.  Appropriate affect.    LAB RESULTS:  CMP     Component Value Date/Time   NA 142 08/08/2018 1213   K 3.8 08/08/2018 1213   CL 105 08/08/2018 1213   CO2 29 08/08/2018 1213   GLUCOSE 93 08/08/2018 1213   BUN 11 08/08/2018 1213   CREATININE 0.70 08/08/2018 1213   CALCIUM 9.3 08/08/2018 1213   PROT 6.6 08/08/2018 1213   ALBUMIN 3.8 08/08/2018 1213   AST 15 08/08/2018 1213   ALT 21 08/08/2018 1213   ALKPHOS 63 08/08/2018 1213   BILITOT 0.3 08/08/2018 1213   GFRNONAA >60 08/08/2018 1213   GFRAA >60 08/08/2018 1213    No results found for: TOTALPROTELP, ALBUMINELP, A1GS, A2GS, BETS, BETA2SER, GAMS, MSPIKE, SPEI  No results found for: KPAFRELGTCHN, LAMBDASER, KAPLAMBRATIO  Lab Results  Component Value Date   WBC 5.1 08/29/2018   NEUTROABS 3.4 08/29/2018   HGB 12.0 08/29/2018   HCT 37.5 08/29/2018   MCV 99.7 08/29/2018   PLT 203 08/29/2018    _0 @  No results found for: LABCA2  No components found for: EQASTM196  No results for input(s): INR in the last 168 hours.  No results found for: LABCA2  No results found for: QIW979  No results found for: GXQ119  No results found for: ERD408  No results found for: CA2729  No components found for: HGQUANT  No results found for: CEA1 / No results found for: CEA1   No  results found for: AFPTUMOR  No results found for: CHROMOGRNA  No results found for: PSA1  Appointment on 08/29/2018  Component Date Value Ref Range Status  . WBC Count 08/29/2018 5.1  4.0 - 10.5 K/uL Final  . RBC 08/29/2018 3.76* 3.87 - 5.11 MIL/uL Final  . Hemoglobin 08/29/2018 12.0  12.0 - 15.0 g/dL Final  . HCT 08/29/2018 37.5  36.0 - 46.0 % Final  . MCV 08/29/2018 99.7  80.0 - 100.0 fL Final  . MCH 08/29/2018 31.9  26.0 - 34.0 pg Final  . MCHC 08/29/2018 32.0  30.0 - 36.0 g/dL Final  . RDW 08/29/2018 11.2* 11.5 - 15.5 % Final  . Platelet Count 08/29/2018 203  150 -  400 K/uL Final  . nRBC 08/29/2018 0.0  0.0 - 0.2 % Final  . Neutrophils Relative % 08/29/2018 67  % Final  . Neutro Abs 08/29/2018 3.4  1.7 - 7.7 K/uL Final  . Lymphocytes Relative 08/29/2018 19  % Final  . Lymphs Abs 08/29/2018 1.0  0.7 - 4.0 K/uL Final  . Monocytes Relative 08/29/2018 11  % Final  . Monocytes Absolute 08/29/2018 0.6  0.1 - 1.0 K/uL Final  . Eosinophils Relative 08/29/2018 2  % Final  . Eosinophils Absolute 08/29/2018 0.1  0.0 - 0.5 K/uL Final  . Basophils Relative 08/29/2018 1  % Final  . Basophils Absolute 08/29/2018 0.0  0.0 - 0.1 K/uL Final  . Immature Granulocytes 08/29/2018 0  % Final  . Abs Immature Granulocytes 08/29/2018 0.01  0.00 - 0.07 K/uL Final   Performed at [facility], [place] [address] [place], [place] [address]    (this displays the last labs from the last 3 days)  No results found for: TOTALPROTELP, ALBUMINELP, A1GS, A2GS, BETS, BETA2SER, GAMS, MSPIKE, SPEI (this displays SPEP labs)  No results found for: KPAFRELGTCHN, LAMBDASER, KAPLAMBRATIO (kappa/lambda light chains)  No results found for: HGBA, HGBA2QUANT, HGBFQUANT, HGBSQUAN (Hemoglobinopathy evaluation)   No results found for: LDH  No results found for: IRON, TIBC, IRONPCTSAT (Iron and TIBC)  No results found for: FERRITIN  Urinalysis No results found for: COLORURINE, APPEARANCEUR,  LABSPEC, PHURINE, GLUCOSEU, HGBUR, BILIRUBINUR, KETONESUR, PROTEINUR, UROBILINOGEN, NITRITE, LEUKOCYTESUR   STUDIES: Due for repeat echocardiogram in March  ELIGIBLE FOR AVAILABLE RESEARCH PROTOCOL: BCEP  ASSESSMENT: 48 y.o.  [place], [place] woman status post right breast upper inner quadrant biopsy 12/29/2017, for a clinical T1c N0, stage IA invasive ductal carcinoma, triple positive, with an MIB-1 of 80%.  (1) genetics testing 02/02/2018 though the CancerNext gene panel offered by [facility] showed no deleterious mutations in  APC, ATM, BARD1, BMPR1A,BRCA1, BRCA2, BRIP1, CDH1, CDK4, CDKN2A, CHEK2, DICER1, HOXB13, MLH1, MRE11A, MSH2, MSH6, MUTYH, NBN, NF1, PALB2, PMS2, POLD1, POLE, PTEN, RAD50, RAD51C, RAD51D, SMAD4, SMARCA4, STK11 and TP53 (sequencing and deletion/duplication); EPCAM and GREM1 (deletion/duplication only).  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>A) )   (2) right lumpectomy and sentinel lymph node sampling 02/16/2018 showed a pT2 pN0, stage IB invasive ductal carcinoma, grade 3, with a positive inferior margin; a total of 5 lymph nodes were removed  (a) additional surgery 02/27/2018 cleared the margins  (3) started carboplatin, docetaxel, trastuzumab and pertuzumab 03/11/2018, repeated every 21 days x 6, last dose 07/18/2018.    (a) Pertuzumab omitted after cycle 1 due to diarrhea.  (b) Gemcitabine substituted for Docetaxel starting with cycle 5 due to peripheral neuropathy  (4) continue trastuzumab to total 6 months (through February 2020).  (a) echocardiogram 01/21/2018 showed an ejection fraction in the 55-60% range  (b) repeat echocardiogram 06/14/2018 showed an ejection fraction in the 60-65%  (5) adjuvant radiation to complete on 09/28/2018  (6) to start tamoxifen at the completion of local treatment  (a) the patient is status post hysterectomy, not bilateral salpingo-oophorectomy  (b) Blum and estradiol levels June  2020   PLAN:  Janie is doing well today. She has no sign of recurrence.  She will complete her final treatment with Trastuzumab today.  She has tolerated this well.  Her labs are stable and we reviewed this with her in detail.  We talked about her hot flashes.  She will forego any medication treatment of these since  they have caused more side effects than benefit.  We reviewed non pharmacologic interventions, such as avoiding alcohol, hot beverages, caffeine, spicy foods.  We also reviewed that acupuncture may be beneficial.    Flower has three bottles of Tamoxifen and will start taking Tamoxifen 13m daily after she completes radiation.  [facility] to be done at her next lab appointment in June.  I went ahead and sent Dr. CBrantley Stagea message about getting her port out in April at her request.    She will return and see Dr. MJana Hakimin 12/2017.  She knows to call for any other issues that may develop before the next visit.  A total of (30) minutes of face-to-face time was spent with this patient with greater than 50% of that time in counseling and care-coordination.   LWilber Bihari NP 08/29/18 9:26 AM Medical Oncology and Hematology [facility] [address]Tel. [phone]   Fax. [phone]

## 2018-08-30 ENCOUNTER — Telehealth: Payer: Self-pay | Admitting: Adult Health

## 2018-08-30 ENCOUNTER — Ambulatory Visit
Admission: RE | Admit: 2018-08-30 | Discharge: 2018-08-30 | Disposition: A | Discharge status: Home / Self Care | Payer: Managed Care, Other (non HMO) | Source: Ambulatory Visit | Attending: Radiation Oncology | Admitting: Radiation Oncology

## 2018-08-30 DIAGNOSIS — Z5112 Encounter for antineoplastic immunotherapy: Secondary | ICD-10-CM | POA: Diagnosis not present

## 2018-08-30 NOTE — Telephone Encounter (Signed)
No los °

## 2018-08-31 ENCOUNTER — Ambulatory Visit
Admission: RE | Admit: 2018-08-31 | Discharge: 2018-08-31 | Disposition: A | Discharge status: Home / Self Care | Payer: Managed Care, Other (non HMO) | Source: Ambulatory Visit | Attending: Radiation Oncology | Admitting: Radiation Oncology

## 2018-08-31 DIAGNOSIS — Z5112 Encounter for antineoplastic immunotherapy: Secondary | ICD-10-CM | POA: Diagnosis not present

## 2018-09-01 ENCOUNTER — Ambulatory Visit
Admission: RE | Admit: 2018-09-01 | Discharge: 2018-09-01 | Disposition: A | Discharge status: Home / Self Care | Payer: Managed Care, Other (non HMO) | Source: Ambulatory Visit | Attending: Radiation Oncology | Admitting: Radiation Oncology

## 2018-09-01 DIAGNOSIS — Z5112 Encounter for antineoplastic immunotherapy: Secondary | ICD-10-CM | POA: Diagnosis not present

## 2018-09-02 ENCOUNTER — Ambulatory Visit
Admission: RE | Admit: 2018-09-02 | Discharge: 2018-09-02 | Disposition: A | Discharge status: Home / Self Care | Payer: Managed Care, Other (non HMO) | Source: Ambulatory Visit | Attending: Radiation Oncology | Admitting: Radiation Oncology

## 2018-09-02 DIAGNOSIS — Z5112 Encounter for antineoplastic immunotherapy: Secondary | ICD-10-CM | POA: Diagnosis not present

## 2018-09-05 ENCOUNTER — Ambulatory Visit
Admission: RE | Admit: 2018-09-05 | Discharge: 2018-09-05 | Disposition: A | Discharge status: Home / Self Care | Payer: Managed Care, Other (non HMO) | Source: Ambulatory Visit | Attending: Radiation Oncology | Admitting: Radiation Oncology

## 2018-09-05 DIAGNOSIS — Z5112 Encounter for antineoplastic immunotherapy: Secondary | ICD-10-CM | POA: Diagnosis not present

## 2018-09-06 ENCOUNTER — Ambulatory Visit
Admission: RE | Admit: 2018-09-06 | Discharge: 2018-09-06 | Disposition: A | Discharge status: Home / Self Care | Payer: Managed Care, Other (non HMO) | Source: Ambulatory Visit | Attending: Radiation Oncology | Admitting: Radiation Oncology

## 2018-09-06 DIAGNOSIS — Z5112 Encounter for antineoplastic immunotherapy: Secondary | ICD-10-CM | POA: Diagnosis not present

## 2018-09-07 ENCOUNTER — Ambulatory Visit
Admission: RE | Admit: 2018-09-07 | Discharge: 2018-09-07 | Disposition: A | Discharge status: Home / Self Care | Payer: Managed Care, Other (non HMO) | Source: Ambulatory Visit | Attending: Radiation Oncology | Admitting: Radiation Oncology

## 2018-09-07 DIAGNOSIS — Z5112 Encounter for antineoplastic immunotherapy: Secondary | ICD-10-CM | POA: Diagnosis not present

## 2018-09-08 ENCOUNTER — Ambulatory Visit
Admission: RE | Admit: 2018-09-08 | Discharge: 2018-09-08 | Disposition: A | Discharge status: Home / Self Care | Payer: Managed Care, Other (non HMO) | Source: Ambulatory Visit | Attending: Radiation Oncology | Admitting: Radiation Oncology

## 2018-09-08 DIAGNOSIS — Z5112 Encounter for antineoplastic immunotherapy: Secondary | ICD-10-CM | POA: Diagnosis not present

## 2018-09-08 MED FILL — OSELTAMIVIR PHOSPHATE 75 MG: 75 | 7 days supply | Qty: 7 | Fill #0

## 2018-09-09 ENCOUNTER — Ambulatory Visit
Admission: RE | Admit: 2018-09-09 | Discharge: 2018-09-09 | Disposition: A | Discharge status: Home / Self Care | Payer: Managed Care, Other (non HMO) | Source: Ambulatory Visit | Attending: Radiation Oncology | Admitting: Radiation Oncology

## 2018-09-09 DIAGNOSIS — Z5112 Encounter for antineoplastic immunotherapy: Secondary | ICD-10-CM | POA: Diagnosis not present

## 2018-09-12 ENCOUNTER — Ambulatory Visit
Admission: RE | Admit: 2018-09-12 | Discharge: 2018-09-12 | Disposition: A | Discharge status: Home / Self Care | Payer: Managed Care, Other (non HMO) | Source: Ambulatory Visit | Attending: Radiation Oncology | Admitting: Radiation Oncology

## 2018-09-12 DIAGNOSIS — Z170 Estrogen receptor positive status [ER+]: Secondary | ICD-10-CM | POA: Diagnosis not present

## 2018-09-12 DIAGNOSIS — C50211 Malignant neoplasm of upper-inner quadrant of right female breast: Secondary | ICD-10-CM | POA: Insufficient documentation

## 2018-09-12 DIAGNOSIS — Z510 Encounter for antineoplastic radiation therapy: Secondary | ICD-10-CM | POA: Insufficient documentation

## 2018-09-13 ENCOUNTER — Ambulatory Visit
Admission: RE | Admit: 2018-09-13 | Discharge: 2018-09-13 | Disposition: A | Discharge status: Home / Self Care | Payer: Managed Care, Other (non HMO) | Source: Ambulatory Visit | Attending: Radiation Oncology | Admitting: Radiation Oncology

## 2018-09-13 DIAGNOSIS — Z510 Encounter for antineoplastic radiation therapy: Secondary | ICD-10-CM | POA: Diagnosis not present

## 2018-09-14 ENCOUNTER — Ambulatory Visit
Admission: RE | Admit: 2018-09-14 | Discharge: 2018-09-14 | Disposition: A | Discharge status: Home / Self Care | Payer: Managed Care, Other (non HMO) | Source: Ambulatory Visit | Attending: Radiation Oncology | Admitting: Radiation Oncology

## 2018-09-14 DIAGNOSIS — Z510 Encounter for antineoplastic radiation therapy: Secondary | ICD-10-CM | POA: Diagnosis not present

## 2018-09-15 ENCOUNTER — Ambulatory Visit
Admission: RE | Admit: 2018-09-15 | Discharge: 2018-09-15 | Disposition: A | Discharge status: Home / Self Care | Payer: Managed Care, Other (non HMO) | Source: Ambulatory Visit | Attending: Radiation Oncology | Admitting: Radiation Oncology

## 2018-09-15 DIAGNOSIS — Z510 Encounter for antineoplastic radiation therapy: Secondary | ICD-10-CM | POA: Diagnosis not present

## 2018-09-16 ENCOUNTER — Ambulatory Visit
Admission: RE | Admit: 2018-09-16 | Discharge: 2018-09-16 | Disposition: A | Discharge status: Home / Self Care | Payer: Managed Care, Other (non HMO) | Source: Ambulatory Visit | Attending: Radiation Oncology | Admitting: Radiation Oncology

## 2018-09-16 DIAGNOSIS — Z510 Encounter for antineoplastic radiation therapy: Secondary | ICD-10-CM | POA: Diagnosis not present

## 2018-09-19 ENCOUNTER — Ambulatory Visit
Admission: RE | Admit: 2018-09-19 | Discharge: 2018-09-19 | Disposition: A | Discharge status: Home / Self Care | Payer: Managed Care, Other (non HMO) | Source: Ambulatory Visit | Attending: Radiation Oncology | Admitting: Radiation Oncology

## 2018-09-19 DIAGNOSIS — Z510 Encounter for antineoplastic radiation therapy: Secondary | ICD-10-CM | POA: Diagnosis not present

## 2018-09-20 ENCOUNTER — Ambulatory Visit
Admission: RE | Admit: 2018-09-20 | Discharge: 2018-09-20 | Disposition: A | Discharge status: Home / Self Care | Payer: Managed Care, Other (non HMO) | Source: Ambulatory Visit | Attending: Radiation Oncology | Admitting: Radiation Oncology

## 2018-09-20 DIAGNOSIS — Z510 Encounter for antineoplastic radiation therapy: Secondary | ICD-10-CM | POA: Diagnosis not present

## 2018-09-21 ENCOUNTER — Ambulatory Visit
Admission: RE | Admit: 2018-09-21 | Discharge: 2018-09-21 | Disposition: A | Discharge status: Home / Self Care | Payer: Managed Care, Other (non HMO) | Source: Ambulatory Visit | Attending: Radiation Oncology | Admitting: Radiation Oncology

## 2018-09-21 DIAGNOSIS — Z510 Encounter for antineoplastic radiation therapy: Secondary | ICD-10-CM | POA: Diagnosis not present

## 2018-09-22 ENCOUNTER — Ambulatory Visit
Admission: RE | Admit: 2018-09-22 | Discharge: 2018-09-22 | Disposition: A | Discharge status: Home / Self Care | Payer: Managed Care, Other (non HMO) | Source: Ambulatory Visit | Attending: Radiation Oncology | Admitting: Radiation Oncology

## 2018-09-22 DIAGNOSIS — Z510 Encounter for antineoplastic radiation therapy: Secondary | ICD-10-CM | POA: Diagnosis not present

## 2018-09-23 ENCOUNTER — Ambulatory Visit
Admission: RE | Admit: 2018-09-23 | Discharge: 2018-09-23 | Disposition: A | Discharge status: Home / Self Care | Payer: Managed Care, Other (non HMO) | Source: Ambulatory Visit | Attending: Radiation Oncology | Admitting: Radiation Oncology

## 2018-09-23 DIAGNOSIS — Z510 Encounter for antineoplastic radiation therapy: Secondary | ICD-10-CM | POA: Diagnosis not present

## 2018-09-26 ENCOUNTER — Ambulatory Visit
Admission: RE | Admit: 2018-09-26 | Discharge: 2018-09-26 | Disposition: A | Discharge status: Home / Self Care | Payer: Managed Care, Other (non HMO) | Source: Ambulatory Visit | Attending: Radiation Oncology | Admitting: Radiation Oncology

## 2018-09-26 DIAGNOSIS — Z510 Encounter for antineoplastic radiation therapy: Secondary | ICD-10-CM | POA: Diagnosis not present

## 2018-09-27 ENCOUNTER — Other Ambulatory Visit: Payer: Self-pay

## 2018-09-27 ENCOUNTER — Ambulatory Visit
Admission: RE | Admit: 2018-09-27 | Discharge: 2018-09-27 | Disposition: A | Discharge status: Home / Self Care | Payer: Managed Care, Other (non HMO) | Source: Ambulatory Visit | Attending: Radiation Oncology | Admitting: Radiation Oncology

## 2018-09-27 DIAGNOSIS — Z510 Encounter for antineoplastic radiation therapy: Secondary | ICD-10-CM | POA: Diagnosis not present

## 2018-09-28 ENCOUNTER — Other Ambulatory Visit: Payer: Self-pay

## 2018-09-28 ENCOUNTER — Encounter: Payer: Self-pay | Admitting: Radiation Oncology

## 2018-09-28 ENCOUNTER — Ambulatory Visit
Admission: RE | Admit: 2018-09-28 | Discharge: 2018-09-28 | Disposition: A | Discharge status: Home / Self Care | Payer: Managed Care, Other (non HMO) | Source: Ambulatory Visit | Attending: Radiation Oncology | Admitting: Radiation Oncology

## 2018-09-28 DIAGNOSIS — Z510 Encounter for antineoplastic radiation therapy: Secondary | ICD-10-CM | POA: Diagnosis not present

## 2018-10-06 NOTE — Progress Notes (Signed)
  Radiation Oncology         [phone]) [phone] ________________________________  Name: Monica [name] MRN: [id number]  Date: 09/28/2018  DOB: 1971/04/24  End of Treatment Note  Diagnosis:   47 y.o. female with Stage IB, pT2N0M0 grade 3, triple positive invasive ductal carcinoma of the right breast  Indication for treatment:  Curative       Radiation treatment dates:   08/15/2018 - 09/28/2018  Site/dose:   The patient initially received a dose of 50.4 Gy in 28 fractions to the right breast using whole-breast tangent fields. This was delivered using a 3-D conformal technique. The patient then received a boost to the seroma. This delivered an additional 10 Gy in 5 fractions using 12E, 9E electrons with a special teletherapy technique. The total dose was 60.4 Gy.  Narrative: The patient tolerated radiation treatment relatively well.   The patient had some expected skin irritation as she progressed during treatment. She has hyperpigmentation over the whole right breast with dryness and dry peeling. Moist desquamation was not present at the end of treatment. She continues to use Radiaplex gel as prescribed. She also noted mild fatigue and occasional shooting pains in her breast.   Plan: The patient has completed radiation treatment. The patient will return to radiation oncology clinic for routine followup in one month. I advised the patient to call or return sooner if they have any questions or concerns related to their recovery or treatment. ________________________________  Jodelle Gross, M.D., Ph.D.  This document serves as a record of services personally performed by [name], MD. It was created on his behalf by Rae Lips, a trained medical scribe. The creation of this record is based on the scribe's personal observations and the provider's statements to them. This document has been checked and approved by the attending provider.

## 2018-11-17 ENCOUNTER — Telehealth: Payer: Self-pay | Admitting: Radiation Oncology

## 2018-11-17 NOTE — Telephone Encounter (Signed)
  Radiation Oncology         [phone]) [phone] ________________________________  Name: Monica [name] MRN: [id number]  Date of Service: 11/17/2018  DOB: [birth year]  Post Treatment Telephone Note  Diagnosis:  Stage IB,pT2N0M0 grade 3, triple positive invasive ductal carcinoma of the right breast  Interval Since Last Radiation:  7 weeks   08/15/2018 - 09/28/2018: The patient initially received a dose of 50.4 Gy in 28 fractions to the right breast using whole-breast tangent fields. This was delivered using a 3-D conformal technique. The patient then received a boost to the seroma. This delivered an additional 10 Gy in 5 fractions using 12E, 9E electrons with a special teletherapy technique. The total dose was 60.4 Gy.  Narrative:  The patient was contacted today for routine follow-up. During treatment she did very well with radiotherapy and did not have significant desquamation. She reports she is doing well and her skin change has nearly resolved. She uses typical hydrocortisone cream as needed for itching. No other complaints are noted.  Impression/Plan: 1. Stage IB,pT2N0M0 grade 3, triple positive invasive ductal carcinoma of the right breast. The patient has been doing well since completion of radiotherapy. We discussed that we would be happy to continue to follow her as needed, but she will also continue to follow up with Dr. Jana Hakim in medical oncology. She was counseled on skin care as well as measures to avoid sun exposure to this area.  2. Survivorship. We discussed the importance of survivorship and she will be scheduled for this in the near future.     Carola Rhine, PAC

## 2018-11-17 NOTE — Telephone Encounter (Signed)
-----   Message from Hayden Pedro, [place] sent at 10/12/2018 12:09 PM EDT ----- Regarding: 11/21/18 1 month follow up

## 2018-11-21 ENCOUNTER — Ambulatory Visit: Payer: Managed Care, Other (non HMO) | Admitting: Radiation Oncology

## 2018-12-21 ENCOUNTER — Telehealth: Payer: Self-pay | Admitting: *Deleted

## 2018-12-21 NOTE — Telephone Encounter (Signed)
Received call from patient stating she needed to change her appointment with Dr. Jana Hakim because she will be out of town.  New appointment confirmed for 12/23/18 at 8am for labs and 830am with Dr. Jana Hakim.

## 2018-12-22 NOTE — Progress Notes (Signed)
[place]  [phone] [phone]     ID: Monica [name] DOB: March 31, 1971  MR#: [id number]  OEV#:[id number]  Patient Care Team: [name], PA-C as PCP - General (Family Medicine) [name], MD as Consulting Physician (General Surgery) Haley Roza, [name], MD as Consulting Physician (Oncology) [name], MD as Consulting Physician (Radiation Oncology) [name], MD as Consulting Physician (Obstetrics and Gynecology) Larey Dresser, MD as Consulting Physician (Cardiology) OTHER MD: [facility]   CHIEF COMPLAINT: triple positive breast cancer  CURRENT TREATMENT: Tamoxifen   HISTORY OF CURRENT ILLNESS: From the original intake note:  Monica [name] (pronounced "uric") palpated a right supraclavicular mass and felt tenderness after stretching one day. She brought this to her PA's attention.  The patient underwent bilateral diagnostic mammography with tomography and right breast ultrasonography at [facility] on 12/20/2017 showing: breast density category C. There was a suspicious palpable mass at the 1 o'clock position upper inner quadrant measuring 1.4 x 1.3 x 1.3 cm located 12 cm from the nipple. There was an additional mass at the 8 o'clock radiant measuring 1.2 x 1.0 x 1.4 cm, which likely represented a cyst. Ultrasound evaluation of the right axilla demonstrates no evidence of lymphadenopathy  Accordingly on 12/29/2017 she proceeded to biopsy of the right breast area in question. The pathology from this procedure showed ([id number]): Invasive ductal carcinoma, grade III. Prognostic indicators significant for: estrogen receptor, 80% positive and progesterone receptor, 70% positive, both with strong staining intensity. Proliferation marker Ki67 at 80%. HER2 amplified with ratios HER2/CEP17 signals 3.44 and average HER2 copies per cell 10.65.  We are obtaining [place] and estradiol levels today to assess her menopausal  status  The patient's subsequent history is as detailed below.   INTERVAL HISTORY: Monica [name] was seen today for follow-up and treatment of her triple positive breast cancer.  She continues on tamoxifen. She began her tamoxifen around the beginning of April. She notes that she is having some weird symptoms. She is having some joint pain and tenderness, primarily in her left knee and right shoulder. She says that it doesn't feel what she thought joint pain would feel like, and that it feels more like a bruise would. Her hair is growing back, but she says that it is still thinning. She has not had any hot flashes or vaginal wetness. She is considering stopping the tamoxifen because she does not like taking pills and because of the joint issues she is having.   She completed adjuvant radiation lasting from 08/15/2018 to 09/28/2018. Site/dose: The patient initially received a dose of 50.4 Gy in 28 fractions to the right breast using whole-breast tangent fields. This was delivered using a 3-D conformal technique. The patient then received a boost to the seroma. This delivered an additional 10 Gy in 5 fractions using 12E, 9E electrons with a special teletherapy technique. The total dose was 60.4 Gy.  She states that radiation went well. She had mild fatigue and continued working through radiation. Her skin did well and she had no peeling, however she said that she was very diligent with her creme application. She does still have some occasional itching, which she will apply some cream to help soothe.  Since her last visit here, she has not undergone any additional studies.    She is scheduled to have her port removed on 02/04/2019.    REVIEW OF SYSTEMS: Monica [name] recently got a new job within the same profession. She says that her new .  For exercise, she continues to walk her dog. She also does yoga once or twice a week. She notes some breast tenderness around the area where they drained a cyst. The patient  denies unusual headaches, visual changes, nausea, vomiting, or dizziness. There has been no unusual cough, phlegm production, or pleurisy. This been no change in bowel or bladder habits. The patient denies unexplained fatigue or unexplained weight loss, bleeding, rash, or fever. A detailed review of systems was otherwise noncontributory.    PAST MEDICAL HISTORY: Past Medical History:  Diagnosis Date  . Arthritis    lower back  . Cancer [facility])    right breast cancer  . Family history of breast cancer   She notes that she had a heart murmur as a child, which she grew out of.   PAST SURGICAL HISTORY: Past Surgical History:  Procedure Laterality Date  . ABDOMINAL HYSTERECTOMY    . BREAST LUMPECTOMY WITH RADIOACTIVE SEED AND SENTINEL LYMPH NODE BIOPSY Right 02/16/2018   Procedure: RIGHT BREAST LUMPECTOMY WITH RADIOACTIVE SEED AND SENTINEL LYMPH NODE BIOPSY;  Surgeon: [name], MD;  Location: [place];  Service: General;  Laterality: Right;  . HYMENECTOMY    . PORTACATH PLACEMENT Right 02/16/2018   Procedure: INSERTION PORT-A-CATH;  Surgeon: [name], MD;  Location: [place];  Service: General;  Laterality: Right;  . RE-EXCISION OF BREAST LUMPECTOMY Right 02/22/2018   Procedure: RE-EXCISION OF RIGHT  BREAST LUMPECTOMY;  Surgeon: [name], MD;  Location: [place];  Service: General;  Laterality: Right;  . REPAIR VAGINAL CUFF N/A 02/07/2017   Procedure: REPAIR VAGINAL CUFF;  Surgeon: Ena Dawley, MD;  Location: [facility];  Service: Gynecology;  Laterality: N/A;  . ROBOTIC ASSISTED TOTAL HYSTERECTOMY WITH SALPINGECTOMY Left 01/20/2017   Procedure: ROBOTIC ASSISTED TOTAL HYSTERECTOMY WITH SALPINGECTOMY;  Surgeon: [name], MD;  Location: [facility];  Service: Gynecology;  Laterality: Left;  Partial Hysterectomy without BSO  FAMILY HISTORY Family History  Problem Relation Age of Onset  . Lung cancer Maternal Grandfather   .  Esophageal cancer Paternal Grandfather 68  . Breast cancer Cousin 63   As of July 2019, the patient's father is alive at 17. The patient's mother is also alive at 2. The patient has 1 brother and 5 sisters. There was a maternal grandfather diagnosed with lung cancer at 34. There was a paternal grandfather diagnosed with esophageal cancer at 53. The patient's father had a pre-cancerous esophageal finding at age 47. There was also a maternal 1st cousin diagnosed with metastatic breast cancer at age 12.    GYNECOLOGIC HISTORY:  Patient's last menstrual period was 12/21/2016 (exact date). Menarche: 48 years old Age at first live birth: 48 years old She is GXP2. She is status post partial hysterectomy without BSO in 2018 She never used HRT. She used oral contraception over 21 years ago with no complications.   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 The patient is separated from her husband, [name]. The patient's son [name], age 27, is in the [employer] and is being stationed in [place] for 3 years beginning January 2020. He plans on working in the railroad industry after he returns. The patient's daughter [name], age 24, works at a [employer].     ADVANCED DIRECTIVES: Not in place   HEALTH MAINTENANCE: Social History   Tobacco Use  . Smoking status: Never Smoker  . Smokeless tobacco: Never Used  Substance Use Topics  . Alcohol use: Yes    Comment: occ  . Drug use: No     Colonoscopy:   PAP: 2018/ prior to hysterectomy  Bone density:   No Known Allergies  Current Outpatient Medications  Medication Sig Dispense Refill  . Acetaminophen (TYLENOL PO) Take by mouth 3 (three) times daily. 2 tabs as needed for aches and pains    . ibuprofen (ADVIL,MOTRIN) 800 MG tablet Take 1 tablet (800 mg total) by mouth every 8 (eight) hours as needed. 30 tablet 0  . lidocaine-prilocaine (EMLA) cream Apply to affected  area once 30 g 3  . loratadine (CLARITIN) 10 MG tablet Take 10 mg by mouth daily.    Marland Kitchen NAPROXEN PO Take by mouth as needed. 1 tab as needed for aches and pain    . Probiotic Product (PROBIOTIC PO) Take by mouth daily.    . tamoxifen (NOLVADEX) 20 MG tablet Take 20 mg by mouth daily.    Marland Kitchen venlafaxine XR (EFFEXOR-XR) 37.5 MG 24 hr capsule Take 1 capsule (37.5 mg total) by mouth daily with breakfast. (Patient not taking: Reported on 08/29/2018) 90 capsule 4   No current facility-administered medications for this visit.     OBJECTIVE: Young appearing white woman in no acute distress  Vitals:   12/23/18 0845  BP: 121/80  Pulse: (!) 59  Resp: 20  Temp: 97.8 F (36.6 C)  SpO2: 100%     Body mass index is 26.73 kg/m.   Wt Readings from Last 3 Encounters:  12/23/18 174 lb 8 oz (79.2 kg)  08/29/18 171 lb 12.8 oz (77.9 kg)  08/02/18 170 lb 6.4 oz (77.3 kg)  ECOG FS:1  Sclerae unicteric, EOMs intact No cervical or supraclavicular adenopathy Lungs no rales or rhonchi Heart regular rate and rhythm Abd soft, nontender, positive bowel sounds MSK no focal spinal tenderness, no upper extremity lymphedema Neuro: nonfocal, well oriented, appropriate affect Breasts: The right breast is status post lumpectomy and radiation.  The skin is minimally hyperpigmented.  The cosmetic result is good.  There is no evidence of residual or recurrent disease.  The left breast is benign.  Both axillae are benign.    LAB RESULTS:  CMP     Component Value Date/Time   NA 141 08/29/2018 0833   K 4.0 08/29/2018 0833   CL 106 08/29/2018 0833   CO2 26 08/29/2018 0833   GLUCOSE 84 08/29/2018 0833   BUN 9 08/29/2018 0833   CREATININE 0.78 08/29/2018 0833   CALCIUM 9.5 08/29/2018 0833   PROT 6.9 08/29/2018 0833   ALBUMIN 3.9 08/29/2018 0833   AST 16 08/29/2018 0833   ALT 12 08/29/2018 0833   ALKPHOS 60 08/29/2018 0833   BILITOT 0.4 08/29/2018 0833   GFRNONAA >60 08/29/2018 0833   GFRAA >60 08/29/2018  0833    No results found for: TOTALPROTELP, ALBUMINELP, A1GS, A2GS, BETS, BETA2SER, GAMS, MSPIKE, SPEI  No results found for: KPAFRELGTCHN, LAMBDASER, [facility]  Lab Results  Component Value Date   WBC 5.0 12/23/2018   NEUTROABS 3.4 12/23/2018   HGB 13.1 12/23/2018   HCT 40.4 12/23/2018   MCV 93.7 12/23/2018   PLT 213 12/23/2018    _0 @  No results found for: LABCA2  No components found for: FBPZWC585  No results for input(s): INR in the last 168 hours.  No results found for: LABCA2  No results found for: IDP824  No results found for: MPN361  No results found for: WER154  No results  found for: CA2729  No components found for: HGQUANT  No results found for: CEA1 / No results found for: CEA1   No results found for: AFPTUMOR  No results found for: CHROMOGRNA  No results found for: PSA1  Appointment on 12/23/2018  Component Date Value Ref Range Status  . WBC Count 12/23/2018 5.0  4.0 - 10.5 K/uL Final  . RBC 12/23/2018 4.31  3.87 - 5.11 MIL/uL Final  . Hemoglobin 12/23/2018 13.1  12.0 - 15.0 g/dL Final  . HCT 12/23/2018 40.4  36.0 - 46.0 % Final  . MCV 12/23/2018 93.7  80.0 - 100.0 fL Final  . MCH 12/23/2018 30.4  26.0 - 34.0 pg Final  . MCHC 12/23/2018 32.4  30.0 - 36.0 g/dL Final  . RDW 12/23/2018 11.7  11.5 - 15.5 % Final  . Platelet Count 12/23/2018 213  150 - 400 K/uL Final  . nRBC 12/23/2018 0.0  0.0 - 0.2 % Final  . Neutrophils Relative % 12/23/2018 67  % Final  . Neutro Abs 12/23/2018 3.4  1.7 - 7.7 K/uL Final  . Lymphocytes Relative 12/23/2018 20  % Final  . Lymphs Abs 12/23/2018 1.0  0.7 - 4.0 K/uL Final  . Monocytes Relative 12/23/2018 10  % Final  . Monocytes Absolute 12/23/2018 0.5  0.1 - 1.0 K/uL Final  . Eosinophils Relative 12/23/2018 2  % Final  . Eosinophils Absolute 12/23/2018 0.1  0.0 - 0.5 K/uL Final  . Basophils Relative 12/23/2018 1  % Final  . Basophils Absolute 12/23/2018 0.0  0.0 - 0.1 K/uL Final  . Immature  Granulocytes 12/23/2018 0  % Final  . Abs Immature Granulocytes 12/23/2018 0.02  0.00 - 0.07 K/uL Final   Performed at [facility], [place] [address] [place], [place] [address]    (this displays the last labs from the last 3 days)  No results found for: TOTALPROTELP, ALBUMINELP, A1GS, A2GS, BETS, BETA2SER, GAMS, MSPIKE, SPEI (this displays SPEP labs)  No results found for: KPAFRELGTCHN, LAMBDASER, KAPLAMBRATIO (kappa/lambda light chains)  No results found for: HGBA, HGBA2QUANT, HGBFQUANT, HGBSQUAN (Hemoglobinopathy evaluation)   No results found for: LDH  No results found for: IRON, TIBC, IRONPCTSAT (Iron and TIBC)  No results found for: FERRITIN  Urinalysis No results found for: COLORURINE, APPEARANCEUR, LABSPEC, PHURINE, GLUCOSEU, HGBUR, BILIRUBINUR, KETONESUR, PROTEINUR, UROBILINOGEN, NITRITE, LEUKOCYTESUR   STUDIES: No results found.   ELIGIBLE FOR AVAILABLE RESEARCH PROTOCOL: BCEP  ASSESSMENT: 48 y.o.  [place], [place] woman status post right breast upper inner quadrant biopsy 12/29/2017, for a clinical T1c N0, stage IA invasive ductal carcinoma, triple positive, with an MIB-1 of 80%.  (1) genetics testing 02/02/2018 though the CancerNext gene panel offered by [facility] showed no deleterious mutations in  APC, ATM, BARD1, BMPR1A,BRCA1, BRCA2, BRIP1, CDH1, CDK4, CDKN2A, CHEK2, DICER1, HOXB13, MLH1, MRE11A, MSH2, MSH6, MUTYH, NBN, NF1, PALB2, PMS2, POLD1, POLE, PTEN, RAD50, RAD51C, RAD51D, SMAD4, SMARCA4, STK11 and TP53 (sequencing and deletion/duplication); EPCAM and GREM1 (deletion/duplication only).  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>A) )   (2) right lumpectomy and sentinel lymph node sampling 02/16/2018 showed a pT2 pN0, stage IB invasive ductal carcinoma, grade 3, with a positive inferior margin; a total of 5 lymph nodes were removed  (a) additional surgery 02/27/2018 cleared the margins  (3) started  carboplatin, docetaxel, trastuzumab and pertuzumab 03/11/2018, repeated every 21 days x 6, last dose 07/18/2018.    (a) Pertuzumab omitted after cycle 1 due to diarrhea.  (  b) Gemcitabine substituted for Docetaxel starting with cycle 5 due to peripheral neuropathy  (4) continued trastuzumab to total 6 months (through February 2020).  (a) echocardiogram 01/21/2018 showed an ejection fraction in the 55-60% range  (b) repeat echocardiogram 06/14/2018 showed an ejection fraction in the 60-65%  (5) adjuvant radiation 08/15/2018 - 09/28/2018 Site/dose:   The patient initially received a dose of 50.4 Gy in 28 fractions to the right breast using whole-breast tangent fields. This was delivered using a 3-D conformal technique. The patient then received a boost to the seroma. This delivered an additional 10 Gy in 5 fractions using 12E, 9E electrons with a special teletherapy technique. The total dose was 60.4 Gy.   (6) started tamoxifen March 2020  (a) the patient is status post hysterectomy, wihtout bilateral salpingo-oophorectomy  (b) FSH and estradiol levels June 2020   PLAN:  Monica [name] is getting close to a year out from definitive surgery for her breast cancer, with no evidence of disease recurrence.  This is favorable.  She is having some issues with tamoxifen and she admits that she just generally does not like to take pills.  We discussed her prognosis in general and she understands that if her risk of recurrence outside the breast is 30% then the treatment she has received so far would drop that to about 10%, and tamoxifen would bring it down to 5%.  If her risk is 45% then the treatment she has received so far would bring it down to 15% and tamoxifen would lower that further to something between 7 and 8%  She understands that were talking about metastatic recurrence which means incurable breast cancer  Tamoxifen also decreases her risk of her developing another breast cancer in either breast.   That risk is approximately 1 %/year otherwise  She does find these numbers motivating as indeed they should be.  We then discussed the symptoms she is having which are mostly local symptoms in the left knee and right shoulder.  This is not likely to be due to tamoxifen.  I think she would benefit from some physical therapy and I will place that referral for her  In fact she is tolerating tamoxifen remarkably well with no hot flashes or other side effects that I can attribute to that drug.  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  I will then see her in early December and then I will see her next year in late July after her next mammogram.  She knows to call for any other issue that may develop before the next visit.   Wyett Narine, [name], MD  12/23/18 9:13 AM Medical Oncology and Hematology [facility] [address] [place], [place] [address] Tel. [phone]    Fax. [phone]  I, Jacqualyn Posey am acting as a Education administrator for Chauncey Cruel, MD.   I, Lurline Del MD, have reviewed the above documentation for accuracy and completeness, and I agree with the above.

## 2018-12-23 ENCOUNTER — Inpatient Hospital Stay: Payer: 59 | Attending: Oncology

## 2018-12-23 ENCOUNTER — Inpatient Hospital Stay (HOSPITAL_BASED_OUTPATIENT_CLINIC_OR_DEPARTMENT_OTHER): Payer: 59 | Admitting: Oncology

## 2018-12-23 ENCOUNTER — Other Ambulatory Visit: Payer: Self-pay

## 2018-12-23 VITALS — BP 121/80 | HR 59 | Temp 97.8°F | Resp 20 | Ht 67.75 in | Wt 174.5 lb

## 2018-12-23 DIAGNOSIS — Z170 Estrogen receptor positive status [ER+]: Secondary | ICD-10-CM

## 2018-12-23 DIAGNOSIS — C50211 Malignant neoplasm of upper-inner quadrant of right female breast: Secondary | ICD-10-CM

## 2018-12-23 DIAGNOSIS — Z79810 Long term (current) use of selective estrogen receptor modulators (SERMs): Secondary | ICD-10-CM | POA: Diagnosis not present

## 2018-12-23 DIAGNOSIS — Z923 Personal history of irradiation: Secondary | ICD-10-CM | POA: Insufficient documentation

## 2018-12-23 LAB — CBC WITH DIFFERENTIAL (CANCER CENTER ONLY)
Abs Immature Granulocytes: 0.02 10*3/uL (ref 0.00–0.07)
Basophils Absolute: 0 10*3/uL (ref 0.0–0.1)
Basophils Relative: 1 %
Eosinophils Absolute: 0.1 10*3/uL (ref 0.0–0.5)
Eosinophils Relative: 2 %
HCT: 40.4 % (ref 36.0–46.0)
Hemoglobin: 13.1 g/dL (ref 12.0–15.0)
Immature Granulocytes: 0 %
Lymphocytes Relative: 20 %
Lymphs Abs: 1 10*3/uL (ref 0.7–4.0)
MCH: 30.4 pg (ref 26.0–34.0)
MCHC: 32.4 g/dL (ref 30.0–36.0)
MCV: 93.7 fL (ref 80.0–100.0)
Monocytes Absolute: 0.5 10*3/uL (ref 0.1–1.0)
Monocytes Relative: 10 %
Neutro Abs: 3.4 10*3/uL (ref 1.7–7.7)
Neutrophils Relative %: 67 %
Platelet Count: 213 10*3/uL (ref 150–400)
RBC: 4.31 MIL/uL (ref 3.87–5.11)
RDW: 11.7 % (ref 11.5–15.5)
WBC Count: 5 10*3/uL (ref 4.0–10.5)
nRBC: 0 % (ref 0.0–0.2)

## 2018-12-23 LAB — CMP (CANCER CENTER ONLY)
ALT: 15 U/L (ref 0–44)
AST: 17 U/L (ref 15–41)
Albumin: 3.9 g/dL (ref 3.5–5.0)
Alkaline Phosphatase: 42 U/L (ref 38–126)
Anion gap: 9 (ref 5–15)
BUN: 11 mg/dL (ref 6–20)
CO2: 26 mmol/L (ref 22–32)
Calcium: 9.3 mg/dL (ref 8.9–10.3)
Chloride: 105 mmol/L (ref 98–111)
Creatinine: 0.85 mg/dL (ref 0.44–1.00)
GFR, Est AFR Am: >60 mL/min (ref >60)
GFR, Estimated: >60 mL/min (ref >60)
Glucose, Bld: 103 mg/dL — ABNORMAL HIGH (ref 70–99)
Potassium: 4.2 mmol/L (ref 3.5–5.1)
Sodium: 140 mmol/L (ref 135–145)
Total Bilirubin: 0.6 mg/dL (ref 0.3–1.2)
Total Protein: 6.9 g/dL (ref 6.5–8.1)

## 2018-12-24 LAB — FOLLICLE STIMULATING HORMONE: FSH: 18.3 m[IU]/mL

## 2018-12-26 ENCOUNTER — Ambulatory Visit: Payer: 59 | Attending: Oncology | Admitting: Physical Therapy

## 2018-12-26 ENCOUNTER — Other Ambulatory Visit: Payer: Self-pay

## 2018-12-26 ENCOUNTER — Telehealth: Payer: Self-pay | Admitting: Oncology

## 2018-12-26 ENCOUNTER — Encounter: Payer: Self-pay | Admitting: Physical Therapy

## 2018-12-26 DIAGNOSIS — R293 Abnormal posture: Secondary | ICD-10-CM | POA: Diagnosis present

## 2018-12-26 DIAGNOSIS — M25562 Pain in left knee: Secondary | ICD-10-CM | POA: Diagnosis present

## 2018-12-26 DIAGNOSIS — C50211 Malignant neoplasm of upper-inner quadrant of right female breast: Secondary | ICD-10-CM | POA: Insufficient documentation

## 2018-12-26 DIAGNOSIS — Z170 Estrogen receptor positive status [ER+]: Secondary | ICD-10-CM | POA: Insufficient documentation

## 2018-12-26 DIAGNOSIS — M25662 Stiffness of left knee, not elsewhere classified: Secondary | ICD-10-CM | POA: Insufficient documentation

## 2018-12-26 DIAGNOSIS — M25611 Stiffness of right shoulder, not elsewhere classified: Secondary | ICD-10-CM | POA: Diagnosis present

## 2018-12-26 NOTE — Telephone Encounter (Signed)
I left a message regarding schedule  

## 2018-12-26 NOTE — Therapy (Signed)
[place], [place], [address] Phone: [phone]   Fax:  [phone]  Physical Therapy Evaluation  Patient Details  Name: Monica [name] MRN: [id number] Date of Birth: [birth year] Referring Provider (PT): Dr. Lurline Del   Encounter Date: 12/26/2018  PT End of Session - 12/26/18 1410    Visit Number  1    Number of Visits  2    Date for PT Re-Evaluation  01/23/19    PT Start Time  1300    PT Stop Time  1400    PT Time Calculation (min)  60 min    Activity Tolerance  Patient tolerated treatment well    Behavior During Therapy  [facility] for tasks assessed/performed       Past Medical History:  Diagnosis Date  . Arthritis    lower back  . Cancer [facility])    right breast cancer  . Family history of breast cancer     Past Surgical History:  Procedure Laterality Date  . ABDOMINAL HYSTERECTOMY    . BREAST LUMPECTOMY WITH RADIOACTIVE SEED AND SENTINEL LYMPH NODE BIOPSY Right 02/16/2018   Procedure: RIGHT BREAST LUMPECTOMY WITH RADIOACTIVE SEED AND SENTINEL LYMPH NODE BIOPSY;  Surgeon: [name], MD;  Location: [place];  Service: General;  Laterality: Right;  . HYMENECTOMY    . PORTACATH PLACEMENT Right 02/16/2018   Procedure: INSERTION PORT-A-CATH;  Surgeon: [name], MD;  Location: [place];  Service: General;  Laterality: Right;  . RE-EXCISION OF BREAST LUMPECTOMY Right 02/22/2018   Procedure: RE-EXCISION OF RIGHT  BREAST LUMPECTOMY;  Surgeon: [name], MD;  Location: [place];  Service: General;  Laterality: Right;  . REPAIR VAGINAL CUFF N/A 02/07/2017   Procedure: REPAIR VAGINAL CUFF;  Surgeon: Ena Dawley, MD;  Location: [place] [facility];  Service: Gynecology;  Laterality: N/A;  . ROBOTIC ASSISTED TOTAL HYSTERECTOMY WITH SALPINGECTOMY Left 01/20/2017   Procedure: ROBOTIC ASSISTED TOTAL HYSTERECTOMY WITH SALPINGECTOMY;  Surgeon: [name],  MD;  Location: [place] [facility];  Service: Gynecology;  Laterality: Left;    There were no vitals filed for this visit.   Subjective Assessment - 12/26/18 1310    Subjective  Patient reports she has been having left knee and right shoulder pain since she began Tamoxifen at the beginning of April. Her pain began shortly after she began Tamoxifen. She wonders if climbing staris alot at work brought on her knee pain.She reports right shoulder and left knee ain both feel like a bruise.    Pertinent History  Right lumpectomy and sentinel node biopsy (0/5 nodes negative) 02/16/2018. Chemo for triple positive breast cancer 03/11/2018-08/08/2018. radiation 08/15/2018-09/28/2018. Currently on Tamoxifen.    Patient Stated Goals  Climb in and out of high bed, kneel, squatting. Decrease shoulder tenderness.    Currently in Pain?  Yes    Pain Score  6     Pain Location  Knee    Pain Orientation  Left    Pain Descriptors / Indicators  Tender    Pain Type  Acute pain    Pain Onset  More than a month ago    Pain Frequency  Intermittent    Aggravating Factors   Kneeling, squatting, getting in/out of bed    Pain Relieving Factors  nothing    Multiple Pain Sites  Yes    Pain Score  8    Pain Location  Shoulder    Pain Orientation  Right    Pain Descriptors /  Indicators  Tender    Pain Type  Acute pain    Pain Onset  More than a month ago    Pain Frequency  Intermittent    Aggravating Factors   Touching shoulder    Pain Relieving Factors  Not touching shoulder         [facility] PT Assessment - 12/26/18 0001      Assessment   Medical Diagnosis  Right shoulder and left knee pain    Referring Provider (PT)  Dr. Sarajane Jews Magrinat    Onset Date/Surgical Date  12/26/18    Hand Dominance  Left    Prior Therapy  yes but not for this      Precautions   Precautions  Other (comment)    Precaution Comments  right arm lymphedema risk      Restrictions   Weight Bearing Restrictions  No      Balance Screen   Has the  patient fallen in the past 6 months  No    Has the patient had a decrease in activity level because of a fear of falling?   No    Is the patient reluctant to leave their home because of a fear of falling?   No      Home Environment   Living Environment  Private residence    Living Arrangements  Children   29 y.o. daughter   Available Help at Discharge  Family      Prior Function   Level of Independence  Independent    Vocation  Full time employment    Physiological scientist    Leisure  Walks daily 30 min      Cognition   Overall Cognitive Status  Within Functional Limits for tasks assessed      Posture/Postural Control   Posture/Postural Control  Postural limitations    Postural Limitations  Rounded Shoulders;Forward head      ROM / Strength   AROM / PROM / Strength  AROM;Strength      AROM   Overall AROM Comments  Bilateral shoulders are WNL    AROM Assessment Site  Shoulder;Knee    Right/Left Shoulder  Right;Left    Right/Left Knee  Right;Left    Right Knee Extension  5    Right Knee Flexion  5    Left Knee Extension  5    Left Knee Flexion  5      Strength   Overall Strength Comments  Left hip adduction 4/5; all other BLE strength 5/5      Palpation   Patella mobility  Normal mobility left patela    Palpation comment  Tender to palpation right anterior shoulder with tightness in horizontal abduction indicating tight anterior capsule. Tender to palpation left medial condyle left femur.      Special Tests    Special Tests  Rotator Cuff Impingement    Rotator Cuff Impingment tests  Empty Can test;Speed's test      Empty Can test   Findings  Negative    Side  Right      Speed's test   Findings  Negative    Side  Right        LYMPHEDEMA/ONCOLOGY QUESTIONNAIRE - 12/26/18 1323      Type   Cancer Type  Right breast cancer      Surgeries   Lumpectomy Date  02/16/18    Sentinel Lymph Node Biopsy Date  02/16/18    Other Surgery Date  02/22/18  Number Lymph Nodes Removed  5      Treatment   Active Chemotherapy Treatment  No    Past Chemotherapy Treatment  Yes    Date  08/08/18    Active Radiation Treatment  No    Past Radiation Treatment  Yes    Date  09/28/18    Body Site  right breast    Current Hormone Treatment  Yes    Date  10/12/18    Drug Name  Tamoxifen    Past Hormone Therapy  No      What other symptoms do you have   Are you Having Heaviness or Tightness  Yes    Are you having Pain  Yes    Are you having pitting edema  No    Is it Hard or Difficult finding clothes that fit  No    Do you have infections  No    Is there Decreased scar mobility  Yes    Stemmer Sign  No      Lymphedema Assessments   Lymphedema Assessments  Upper extremities          Quick Dash - 12/26/18 0001    Open a tight or new jar  No difficulty    Do heavy household chores (wash walls, wash floors)  No difficulty    Carry a shopping bag or briefcase  No difficulty    Wash your back  No difficulty    Use a knife to cut food  No difficulty    Recreational activities in which you take some force or impact through your arm, shoulder, or hand (golf, hammering, tennis)  No difficulty    During the past week, to what extent has your arm, shoulder or hand problem interfered with your normal social activities with family, friends, neighbors, or groups?  Not at all    During the past week, to what extent has your arm, shoulder or hand problem limited your work or other regular daily activities  Not at all    Arm, shoulder, or hand pain.  Mild    Tingling (pins and needles) in your arm, shoulder, or hand  None    Difficulty Sleeping  No difficulty    DASH Score  2.27 %        Objective measurements completed on examination: See above findings.              PT Education - 12/26/18 1409    Education Details  Shoulder stretches for anterior capsule; quad stretches and hip adduction strengthening    Person(s) Educated   Patient    Methods  Explanation;Demonstration;Handout    Comprehension  Returned demonstration;Verbalized understanding          PT Long Term Goals - 12/26/18 1418      PT LONG TERM GOAL #1   Title  Patient will be independent with her home exericse program related to shoulder stretching and knee stretch/strength.    Time  4    Period  Weeks    Status  New      PT LONG TERM GOAL #2   Title  Patient will report her pain has decreased by >/= 40% since her eval to tolerate daily tasks with less difficulty.    Time  4    Period  Weeks    Status  New             Plan - 12/26/18 1411    Clinical Impression Statement  Patient is  done with breast cancer treatment including surgery, chemo, and radiation. She began tamoxifen in April 2020 and reports since that time, she has right shoulder and left knee tenderness. After doing special test, ROM and strength assessments, it does not appear to be joint related. She did have some tightness and tenderness present and exercises were prescribed t address those deficits. We discussed the importance of a regular exercise program to strengthen her joints and reduce her risk of a recurrence. She verbalized good understanding of that and reports she plans to begin walking more and swimming some. The plan is for her to try the HEP for 2 weeks and then call or email me with results. If she continues to have persistent pain, we can consider physcial therapy focused on soft tissue work and exercise progression.    Stability/Clinical Decision Making  Stable/Uncomplicated    Clinical Decision Making  Low    Rehab Potential  Excellent    PT Frequency  1x / week    PT Duration  --   To be seen 1 more time if needed   PT Treatment/Interventions  ADLs/Self Care Home Management;Therapeutic exercise;Patient/family education;Manual techniques    PT Next Visit Plan  Pt will contact me in 2 weeks to let me know if she needs to return to PT. If she does, consider  soft tissue work for medial and posterior left knee and also right anterior shoulder.    PT Home Exercise Plan  Quad stretch, hip adduction strengthening, anterior shoulder stretch    Consulted and Agree with Plan of Care  Patient       Patient will benefit from skilled therapeutic intervention in order to improve the following deficits and impairments:  Pain, Postural dysfunction  Visit Diagnosis: Malignant neoplasm of upper-inner quadrant of right breast in female, estrogen receptor positive ([place]) - Plan: PT plan of care cert/re-cert  Abnormal posture - Plan: PT plan of care cert/re-cert  Stiffness of right shoulder, not elsewhere classified - Plan: PT plan of care cert/re-cert  Acute pain of left knee - Plan: PT plan of care cert/re-cert  Stiffness of left knee, not elsewhere classified - Plan: PT plan of care cert/re-cert     Problem List Patient Active Problem List   Diagnosis Date Noted  . Port-A-Cath in place 03/08/2018  . Genetic testing 02/03/2018  . Family history of breast cancer 01/12/2018  . Malignant neoplasm of upper-inner quadrant of right breast in female, estrogen receptor positive ([place]) 01/05/2018  . Vaginal bleeding 02/07/2017  . Anemia 02/07/2017  . Orthostasis 02/07/2017  . S/P laparoscopic hysterectomy 01/20/2017   Annia Friendly, PT 12/26/18 2:34 PM  [place], [place], [address] Phone: [phone]   Fax:  [phone]  Name: Monica [name] MRN: [id number] Date of Birth: Feb 25, 1971

## 2018-12-26 NOTE — Patient Instructions (Signed)
HIP: Adduction - Side-Lying    Lie on side with top leg crossed in front of bottom leg. Raise bottom leg, keep knee straight. _10__ reps per set, _2_ sets per day, _7__ days per week   Copyright  VHI. All rights reserved.   Quad Stretch    Reach back and grasp residual limb. Pull toward buttocks until a stretch is felt in front of thigh. Hold __10__ seconds. Repeat __3__ times. Do __2   Copyright  VHI. All rights reserved.  Stretching: Quadriceps (Standing)    Pull right heel toward buttock until stretch is felt in front of thigh. Hold _10__ seconds. Repeat __3__ times per set. Do __1__ sets per session. Do __2__ sessions per day.  http://orth.exer.us/655   Copyright  VHI. All rights reserved.  _ sessions per day.  Copyright  VHI. All rights reserved.  Scapular Retraction (Standing)    With arms at sides, pinch shoulder blades together. Repeat __10__ times per set. Do _1___ sets per session. Do __2__ sessions per day.  http://orth.exer.us/945   Copyright  VHI. All rights reserved.  CHEST: Doorway, Bilateral - Standing    Standing in doorway, place hands on wall with elbows bent at shoulder height. Lean forward. Hold _10__ seconds. _3__ reps per set, __2_ sets per day, __7_ days per week  Copyright  VHI. All rights reserved.  Shoulder Roll    Move shoulders forward, up, back, then down. Continue circling shoulders backward _10__ times. Repeat, circling shoulders forward. Do _3-5__ times per day.  Copyright  VHI. All rights reserved.

## 2018-12-28 LAB — ESTRADIOL, ULTRA SENS: Estradiol, Sensitive: 470.4 pg/mL

## 2019-01-05 ENCOUNTER — Telehealth: Payer: Self-pay | Admitting: Adult Health

## 2019-01-05 NOTE — Telephone Encounter (Signed)
Scheduled appt per 6/24 sch message- pt aware of appt date and time   

## 2019-01-09 ENCOUNTER — Ambulatory Visit: Payer: Managed Care, Other (non HMO) | Admitting: Oncology

## 2019-01-09 ENCOUNTER — Other Ambulatory Visit: Payer: Managed Care, Other (non HMO)

## 2019-01-10 ENCOUNTER — Other Ambulatory Visit: Payer: Self-pay

## 2019-01-10 ENCOUNTER — Ambulatory Visit
Admission: RE | Admit: 2019-01-10 | Discharge: 2019-01-10 | Disposition: A | Discharge status: Home / Self Care | Payer: Managed Care, Other (non HMO) | Source: Ambulatory Visit | Attending: Oncology | Admitting: Oncology

## 2019-01-10 DIAGNOSIS — Z170 Estrogen receptor positive status [ER+]: Secondary | ICD-10-CM

## 2019-01-10 DIAGNOSIS — C50211 Malignant neoplasm of upper-inner quadrant of right female breast: Secondary | ICD-10-CM

## 2019-01-10 HISTORY — DX: Personal history of antineoplastic chemotherapy: Z92.21

## 2019-01-10 HISTORY — DX: Personal history of irradiation: Z92.3

## 2019-01-10 IMAGING — MG DIGITAL DIAGNOSTIC BILATERAL MAMMOGRAM WITH TOMO AND CAD
6 of 9 series · 6 of 25 positions shown · non-contrast
Comparison: Previous exam(s).

CLINICAL DATA: History of RIGHT breast cancer in an [H8] status
post and radiation therapy.

EXAM:
DIGITAL DIAGNOSTIC BILATERAL MAMMOGRAM WITH CAD AND TOMO

[R MLO]
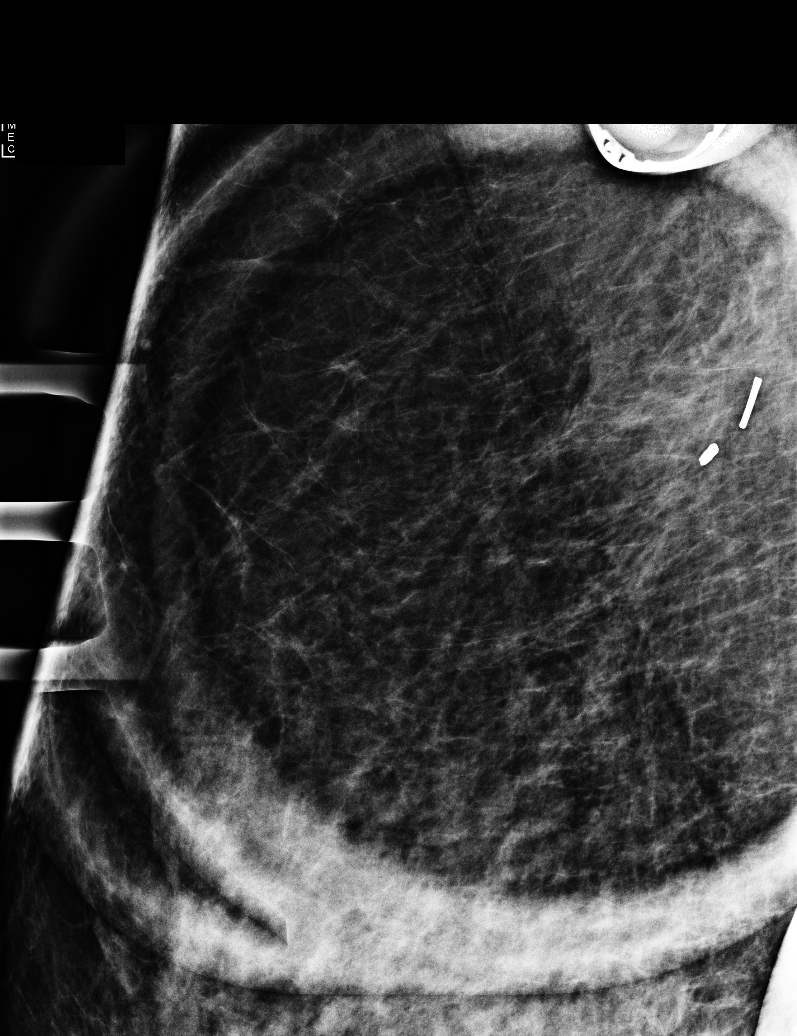

[R MLO synth-2D]
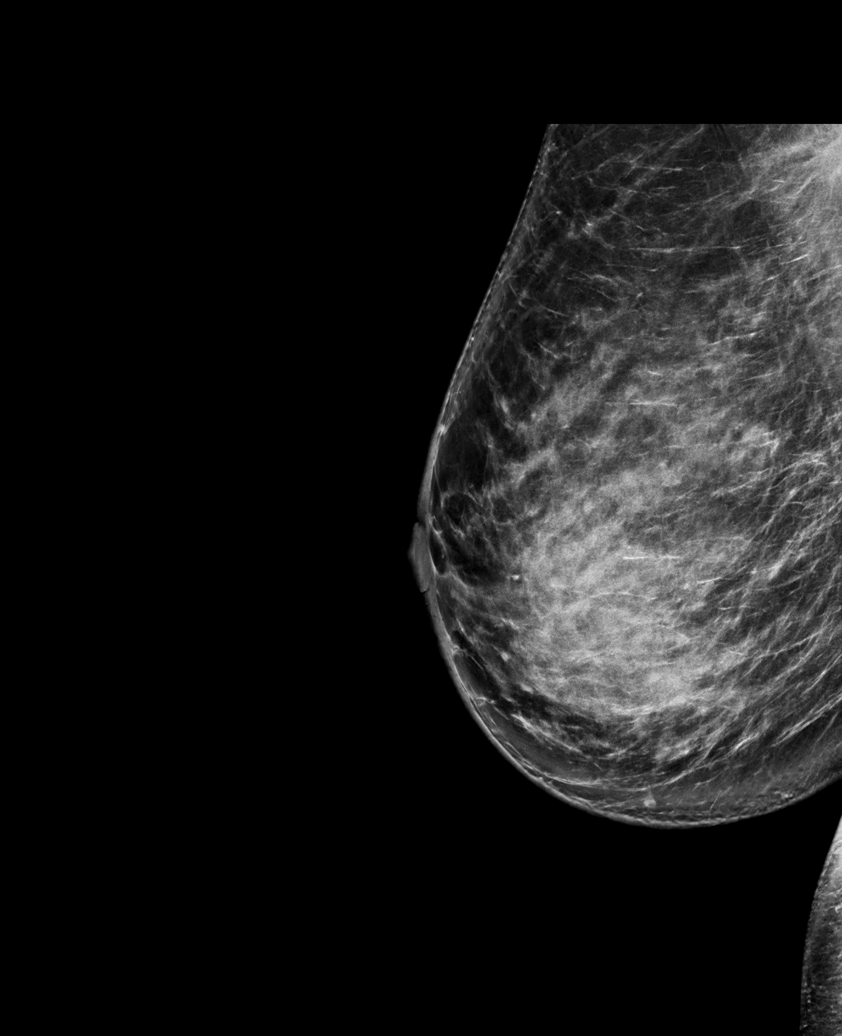

[L CC synth-2D]
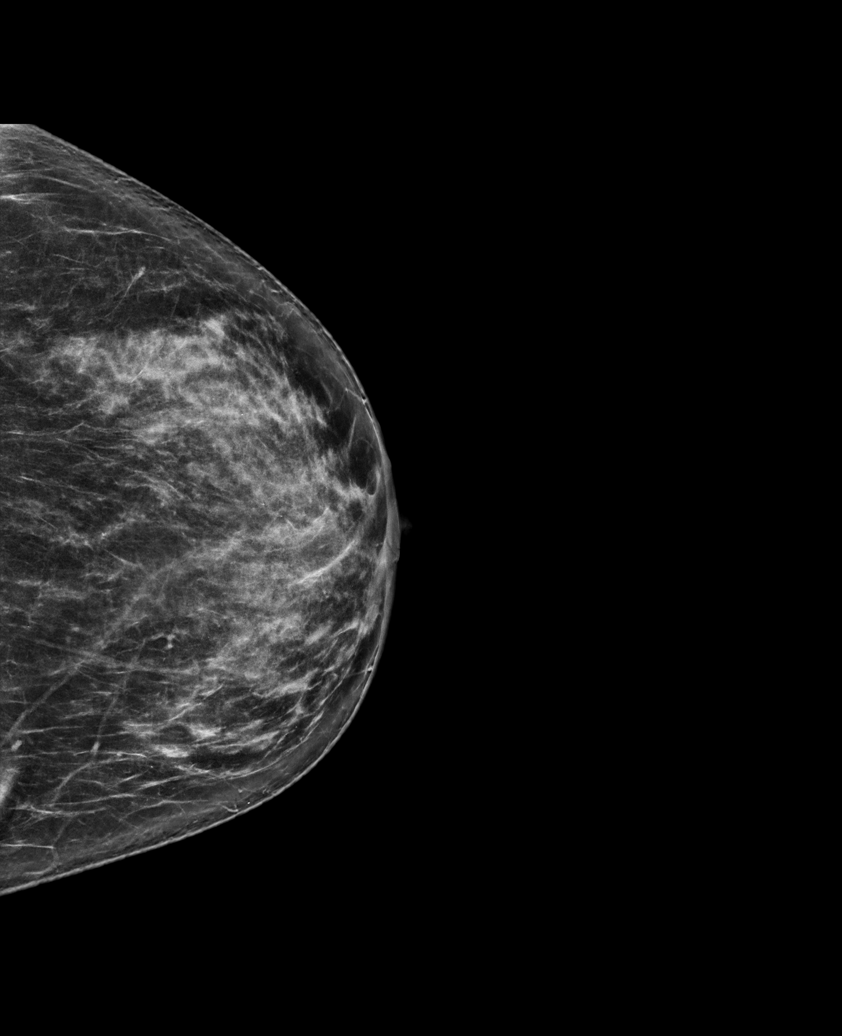

[R CC synth-2D]
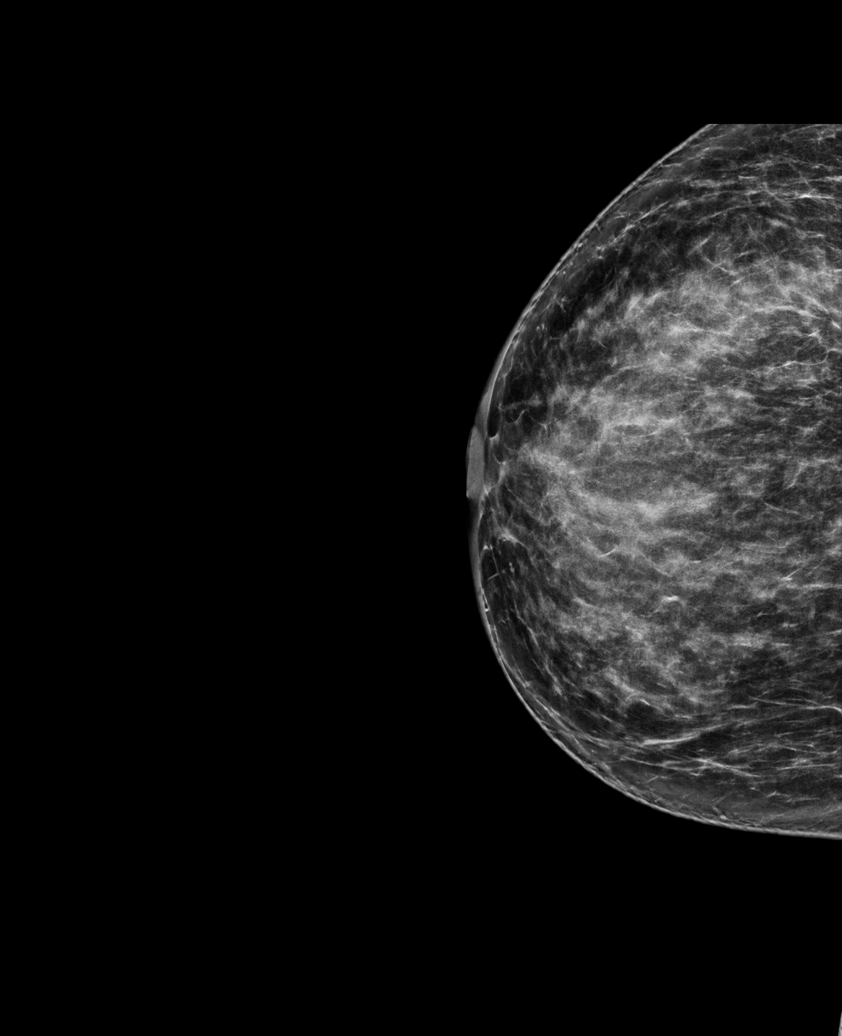

[L MLO synth-2D]
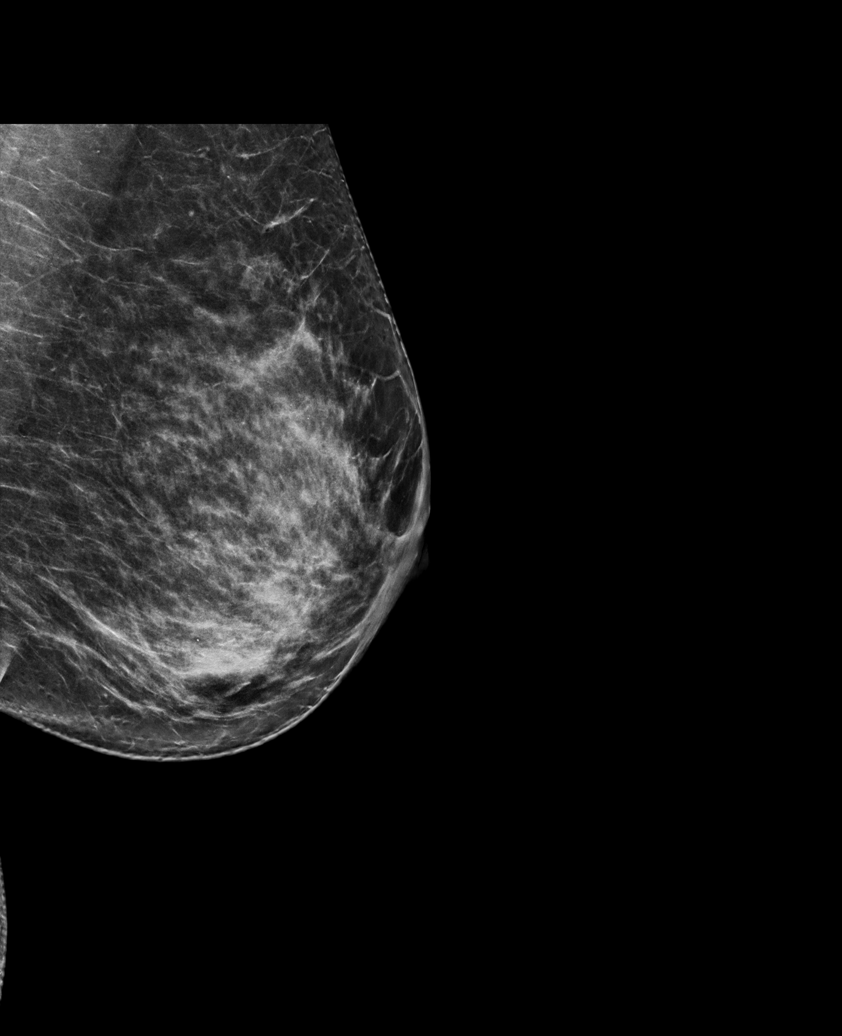

[R CC tomo · tomo slice 40/79.0]
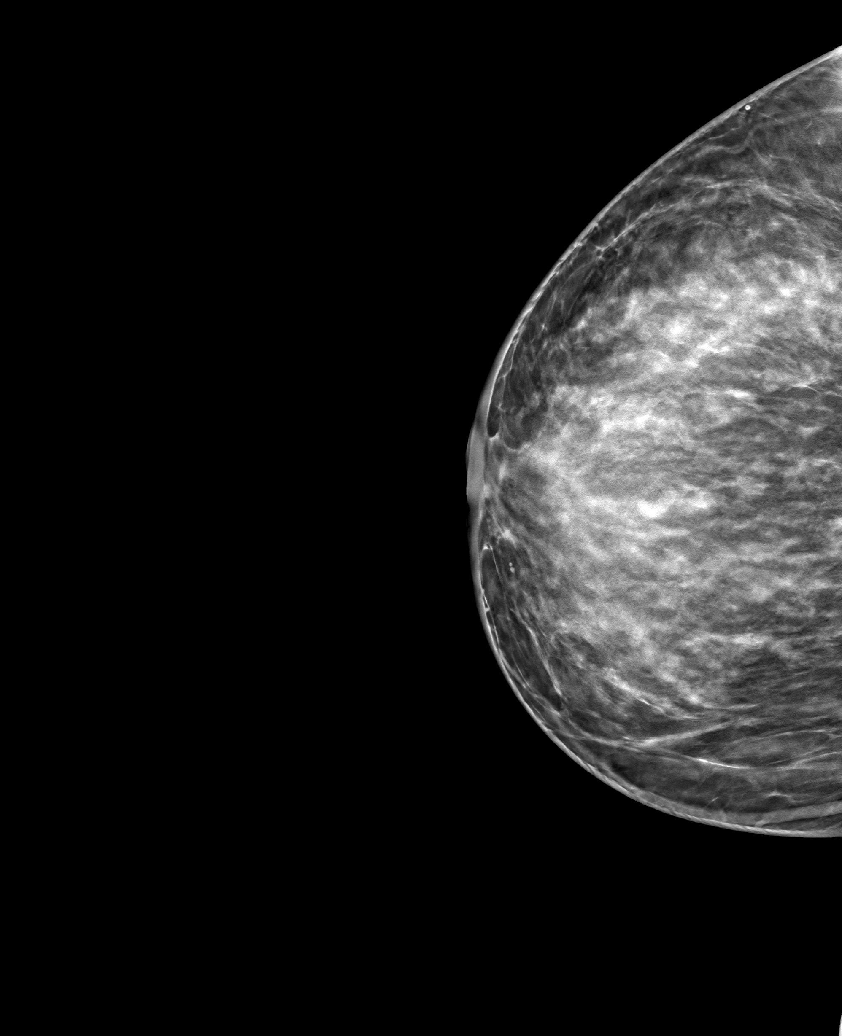

[6 of 25 positions shown; findings below may reference images not displayed]

ACR Breast Density Category c: The breast tissue is heterogeneously
dense, which may obscure small masses.
FINDINGS: There are expected postsurgical changes within the upper RIGHT
breast. There are no new dominant masses, suspicious calcifications
or secondary signs of malignancy within either breast.

Mammographic images were processed with CAD.
IMPRESSION: No evidence of malignancy within either breast. Expected
postsurgical changes within the RIGHT breast.

RECOMMENDATION:
Bilateral diagnostic mammogram in 1 year.

I have discussed the findings and recommendations with the patient.
Results were also provided in writing at the conclusion of the
visit. If applicable, a reminder letter will be sent to the patient
regarding the next appointment.

BI-RADS CATEGORY  2: Benign.

## 2019-01-23 ENCOUNTER — Telehealth: Payer: Self-pay | Admitting: *Deleted

## 2019-01-23 ENCOUNTER — Other Ambulatory Visit: Payer: Self-pay | Admitting: *Deleted

## 2019-01-23 DIAGNOSIS — C50211 Malignant neoplasm of upper-inner quadrant of right female breast: Secondary | ICD-10-CM

## 2019-01-23 DIAGNOSIS — Z170 Estrogen receptor positive status [ER+]: Secondary | ICD-10-CM

## 2019-01-23 NOTE — Telephone Encounter (Signed)
Added lab/reserach f/u to 9/15 S[facility] visit. Date/time/pt aware per 7/13 schedule message.

## 2019-03-28 ENCOUNTER — Encounter: Payer: Self-pay | Admitting: *Deleted

## 2019-03-28 ENCOUNTER — Encounter: Payer: Self-pay | Admitting: Adult Health

## 2019-03-28 ENCOUNTER — Inpatient Hospital Stay: Payer: 59 | Attending: Oncology | Admitting: Adult Health

## 2019-03-28 ENCOUNTER — Inpatient Hospital Stay: Payer: 59 | Admitting: *Deleted

## 2019-03-28 ENCOUNTER — Inpatient Hospital Stay: Payer: 59

## 2019-03-28 ENCOUNTER — Other Ambulatory Visit: Payer: Self-pay

## 2019-03-28 VITALS — BP 104/70 | HR 52 | Temp 98.0°F | Resp 18 | Ht 66.75 in | Wt 172.7 lb

## 2019-03-28 DIAGNOSIS — C50211 Malignant neoplasm of upper-inner quadrant of right female breast: Secondary | ICD-10-CM

## 2019-03-28 DIAGNOSIS — Z170 Estrogen receptor positive status [ER+]: Secondary | ICD-10-CM

## 2019-03-28 DIAGNOSIS — Z79810 Long term (current) use of selective estrogen receptor modulators (SERMs): Secondary | ICD-10-CM | POA: Diagnosis not present

## 2019-03-28 LAB — CBC WITH DIFFERENTIAL (CANCER CENTER ONLY)
Abs Immature Granulocytes: 0.02 10*3/uL (ref 0.00–0.07)
Basophils Absolute: 0 10*3/uL (ref 0.0–0.1)
Basophils Relative: 1 %
Eosinophils Absolute: 0.1 10*3/uL (ref 0.0–0.5)
Eosinophils Relative: 1 %
HCT: 38.9 % (ref 36.0–46.0)
Hemoglobin: 13 g/dL (ref 12.0–15.0)
Immature Granulocytes: 0 %
Lymphocytes Relative: 21 %
Lymphs Abs: 1.2 10*3/uL (ref 0.7–4.0)
MCH: 31.3 pg (ref 26.0–34.0)
MCHC: 33.4 g/dL (ref 30.0–36.0)
MCV: 93.7 fL (ref 80.0–100.0)
Monocytes Absolute: 0.5 10*3/uL (ref 0.1–1.0)
Monocytes Relative: 9 %
Neutro Abs: 3.9 10*3/uL (ref 1.7–7.7)
Neutrophils Relative %: 68 %
Platelet Count: 212 10*3/uL (ref 150–400)
RBC: 4.15 MIL/uL (ref 3.87–5.11)
RDW: 11.4 % — ABNORMAL LOW (ref 11.5–15.5)
WBC Count: 5.7 10*3/uL (ref 4.0–10.5)
nRBC: 0 % (ref 0.0–0.2)

## 2019-03-28 LAB — CMP (CANCER CENTER ONLY)
ALT: 14 U/L (ref 0–44)
AST: 20 U/L (ref 15–41)
Albumin: 4.2 g/dL (ref 3.5–5.0)
Alkaline Phosphatase: 39 U/L (ref 38–126)
Anion gap: 8 (ref 5–15)
BUN: 13 mg/dL (ref 6–20)
CO2: 27 mmol/L (ref 22–32)
Calcium: 9.3 mg/dL (ref 8.9–10.3)
Chloride: 106 mmol/L (ref 98–111)
Creatinine: 0.82 mg/dL (ref 0.44–1.00)
GFR, Est AFR Am: >60 mL/min (ref >60)
GFR, Estimated: >60 mL/min (ref >60)
Glucose, Bld: 95 mg/dL (ref 70–99)
Potassium: 4.2 mmol/L (ref 3.5–5.1)
Sodium: 141 mmol/L (ref 135–145)
Total Bilirubin: 0.5 mg/dL (ref 0.3–1.2)
Total Protein: 6.9 g/dL (ref 6.5–8.1)

## 2019-03-28 LAB — RESEARCH LABS

## 2019-03-28 NOTE — Progress Notes (Signed)
CLINIC:  Survivorship   REASON FOR VISIT:  Routine follow-up post-treatment for a recent history of breast cancer.  BRIEF ONCOLOGIC HISTORY:  Oncology History  Malignant neoplasm of upper-inner quadrant of right breast in female, estrogen receptor positive ([place])  12/29/2017 Initial Diagnosis   [place] woman status post right breast upper inner quadrant biopsy, for a clinical T1c N0, stage IA invasive ductal carcinoma, triple positive, with an MIB-1 of 80%.     Genetic Testing   Negative genetic testing on [employer]'s CancerNext 34 gene panel. 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 It should not be used to make medical management decisions.   The CancerNext gene panel offered by [employer] includes sequencing and rearrangement analysis for the following 32 genes:   APC, ATM, BARD1, BMPR1A, BRCA1, BRCA2, BRIP1, CDH1, CDK4, CDKN2A, CHEK2, DICER1, EPCAM, GREM1, HOXB13MLH1, MRE11A, MSH2, MSH6, MUTYH, NBN, NF1, PALB2, PMS2, POLD1, POLE, PTEN, RAD50, RAD51D, SMAD4, SMARCA4, STK11, and TP53.   The report date is 02/02/2018.   02/16/2018 Surgery   right lumpectomy and sentinel lymph node sampling showed a pT2 pN0, stage IB invasive ductal carcinoma, grade 3, with a positive inferior margin; a total of 5 lymph nodes were removed             (a) additional surgery 02/27/2018 cleared the margins   02/2018 - 08/2018 Adjuvant Chemotherapy    started carboplatin, docetaxel, trastuzumab and pertuzumab 03/11/2018, repeated every 21 days x 6, last dose 07/18/2018.               (a) Pertuzumab omitted after cycle 1 due to diarrhea.             (b) Gemcitabine substituted for Docetaxel starting with cycle 5 due to peripheral neuropathy   continued trastuzumab to total 6 months (through February 2020).             (a) echocardiogram 01/21/2018 showed an ejection fraction in the 55-60% range             (b) repeat echocardiogram 06/14/2018 showed an ejection fraction  in the 60-65%     08/15/2018 - 09/28/2018 Radiation Therapy   adjuvant radiation Site/dose:   The patient initially received a dose of 50.4 Gy in 28 fractions to the right breast using whole-breast tangent fields. This was delivered using a 3-D conformal technique. The patient then received a boost to the seroma. This delivered an additional 10 Gy in 5 fractions using 12E, 9E electrons with a special teletherapy technique. The total dose was 60.4 Gy.    09/2018 -  Anti-estrogen oral therapy   started tamoxifen March 2020             (a) the patient is status post hysterectomy, wihtout bilateral salpingo-oophorectomy             (b) [place] and estradiol levels June 2020     INTERVAL HISTORY:  Ms. Scruggs presents to the [facility] today for our initial meeting to review her survivorship care plan detailing her treatment course for breast cancer, as well as monitoring long-term side effects of that treatment, education regarding health maintenance, screening, and overall wellness and health promotion.     Overall, Ms. Holben reports feeling quite well.  She cotninues on Tamoxifen daily with good tolerance.  She has occ. Arthralgias, but these are manageable.  She has changed jobs and is much happier with her new career.     REVIEW OF SYSTEMS:  Review of Systems  Constitutional: Negative for appetite change, fatigue, fever and unexpected weight change.  HENT:   Negative for hearing loss, lump/mass, sore throat and trouble swallowing.   Eyes: Negative for eye problems and icterus.  Respiratory: Negative for chest tightness, cough and shortness of breath.   Cardiovascular: Negative for chest pain, leg swelling and palpitations.  Gastrointestinal: Negative for abdominal distention, abdominal pain, constipation, diarrhea and nausea.  Endocrine: Negative for hot flashes.  Genitourinary: Negative for difficulty urinating.   Musculoskeletal: Negative for arthralgias.  Skin: Negative for  itching and rash.  Neurological: Negative for dizziness, extremity weakness, headaches and numbness.  Hematological: Negative for adenopathy. Does not bruise/bleed easily.  Psychiatric/Behavioral: Negative for depression. The patient is not nervous/anxious.   Breast: Denies any new nodularity, masses, tenderness, nipple changes, or nipple discharge.      ONCOLOGY TREATMENT TEAM:  1. Surgeon:  Dr. Brantley Stage at [facility] 2. Medical Oncologist: Dr. Jana Hakim  3. Radiation Oncologist: Dr. [name]    PAST MEDICAL/SURGICAL HISTORY:  Past Medical History:  Diagnosis Date  . Arthritis    lower back  . Cancer [facility])    right breast cancer  . Family history of breast cancer   . Personal history of chemotherapy   . Personal history of radiation therapy    Past Surgical History:  Procedure Laterality Date  . ABDOMINAL HYSTERECTOMY    . BREAST LUMPECTOMY Right   . BREAST LUMPECTOMY WITH RADIOACTIVE SEED AND SENTINEL LYMPH NODE BIOPSY Right 02/16/2018   Procedure: RIGHT BREAST LUMPECTOMY WITH RADIOACTIVE SEED AND SENTINEL LYMPH NODE BIOPSY;  Surgeon: [name], MD;  Location: [place];  Service: General;  Laterality: Right;  . HYMENECTOMY    . PORTACATH PLACEMENT Right 02/16/2018   Procedure: INSERTION PORT-A-CATH;  Surgeon: [name], MD;  Location: [place];  Service: General;  Laterality: Right;  . RE-EXCISION OF BREAST LUMPECTOMY Right 02/22/2018   Procedure: RE-EXCISION OF RIGHT  BREAST LUMPECTOMY;  Surgeon: [name], MD;  Location: [place];  Service: General;  Laterality: Right;  . REPAIR VAGINAL CUFF N/A 02/07/2017   Procedure: REPAIR VAGINAL CUFF;  Surgeon: Ena Dawley, MD;  Location: [place] [facility];  Service: Gynecology;  Laterality: N/A;  . ROBOTIC ASSISTED TOTAL HYSTERECTOMY WITH SALPINGECTOMY Left 01/20/2017   Procedure: ROBOTIC ASSISTED TOTAL HYSTERECTOMY WITH SALPINGECTOMY;  Surgeon: [name], MD;   Location: [place] [facility];  Service: Gynecology;  Laterality: Left;     ALLERGIES:  No Known Allergies   CURRENT MEDICATIONS:  Outpatient Encounter Medications as of 03/28/2019  Medication Sig  . Acetaminophen (TYLENOL PO) Take by mouth as needed. 2 tabs as needed for aches and pains   . ibuprofen (ADVIL,MOTRIN) 800 MG tablet Take 1 tablet (800 mg total) by mouth every 8 (eight) hours as needed. (Patient taking differently: Take 800 mg by mouth as needed. )  . loratadine (CLARITIN) 10 MG tablet Take 10 mg by mouth daily as needed.   Marland Kitchen NAPROXEN PO Take by mouth as needed. 1 tab as needed for aches and pain  . Probiotic Product (PROBIOTIC PO) Take by mouth daily.  . tamoxifen (NOLVADEX) 20 MG tablet Take 20 mg by mouth daily.  . [DISCONTINUED] lidocaine-prilocaine (EMLA) cream Apply to affected area once (Patient not taking: Reported on 12/26/2018)  . [DISCONTINUED] venlafaxine XR (EFFEXOR-XR) 37.5 MG 24 hr capsule Take 1 capsule (37.5 mg total) by mouth daily with breakfast. (Patient not taking: Reported on 08/29/2018)   No facility-administered encounter  medications on file as of 03/28/2019.      ONCOLOGIC FAMILY HISTORY:  Family History  Problem Relation Age of Onset  . Lung cancer Maternal Grandfather   . Esophageal cancer Paternal Grandfather 4  . Breast cancer Cousin 31     GENETIC COUNSELING/TESTING: See above  SOCIAL HISTORY:  Social History   Socioeconomic History  . Marital status: Legally Separated    Spouse name: Not on file  . Number of children: Not on file  . Years of education: Not on file  . Highest education level: Not on file  Occupational History  . Not on file  Social Needs  . Financial resource strain: Not on file  . Food insecurity    Worry: Not on file    Inability: Not on file  . Transportation needs    Medical: Not on file    Non-medical: Not on file  Tobacco Use  . Smoking status: Never Smoker  . Smokeless tobacco: Never Used  Substance and  Sexual Activity  . Alcohol use: Yes    Comment: occ  . Drug use: No  . Sexual activity: Not Currently    Birth control/protection: Surgical  Lifestyle  . Physical activity    Days per week: Not on file    Minutes per session: Not on file  . Stress: Not on file  Relationships  . Social Herbalist on phone: Not on file    Gets together: Not on file    Attends religious service: Not on file    Active member of club or organization: Not on file    Attends meetings of clubs or organizations: Not on file    Relationship status: Not on file  . Intimate partner violence    Fear of current or ex partner: Not on file    Emotionally abused: Not on file    Physically abused: Not on file    Forced sexual activity: Not on file  Other Topics Concern  . Not on file  Social History Narrative  . Not on file      PHYSICAL EXAMINATION:  Vital Signs:   Vitals:   03/28/19 1013 03/28/19 1014  BP: 106/75 104/70  Pulse: (!) 52 (!) 52  Resp: 18   Temp: 98 F (36.7 C)   SpO2: 100%    Filed Weights   03/28/19 1013  Weight: 172 lb 11.2 oz (78.3 kg)   General: Well-nourished, well-appearing female in no acute distress.  She is unaccompanied today.   HEENT: Head is normocephalic.  Pupils equal and reactive to light. Conjunctivae clear without exudate.  Sclerae anicteric. Oral mucosa is pink, moist.  Oropharynx is pink without lesions or erythema.  Lymph: No cervical, supraclavicular, or infraclavicular lymphadenopathy noted on palpation.  Cardiovascular: Regular rate and rhythm.Marland Kitchen Respiratory: Clear to auscultation bilaterally. Chest expansion symmetric; breathing non-labored.  Breasts: right breast s/p lumpectomy and radiation, no sign of local recurrence, left breast benign GI: Abdomen soft and round; non-tender, non-distended. Bowel sounds normoactive.  GU: Deferred.  Neuro: No focal deficits. Steady gait.  Psych: Mood and affect normal and appropriate for situation.   Extremities: No edema. MSK: No focal spinal tenderness to palpation.  Full range of motion in bilateral upper extremities Skin: Warm and dry.  LABORATORY DATA:  None for this visit.  DIAGNOSTIC IMAGING:  None for this visit.      ASSESSMENT AND PLAN:  Ms.. Helzer is a pleasant 48 y.o. female with Stage IB right breast  invasive ductal carcinoma, ER+/PR+/HER2+, diagnosed in 12/2017, treated with lumpectomy, adjuvant radiation therapy, and anti-estrogen therapy with Tamoxifen beginning in 09/2018.  She presents to the Survivorship Clinic for our initial meeting and routine follow-up post-completion of treatment for breast cancer.    1. Stage IB right breast cancer:  Ms. Mclaurin is continuing to recover from definitive treatment for breast cancer. She will follow-up with her medical oncologist, Dr. Jana Hakim in 06/2019 with history and physical exam per surveillance protocol.  She will continue her anti-estrogen therapy with Tamoxifen. Thus far, she is tolerating the Tamoxifen well, with minimal side effects.  Today, a comprehensive survivorship care plan and treatment summary was reviewed with the patient today detailing her breast cancer diagnosis, treatment course, potential late/long-term effects of treatment, appropriate follow-up care with recommendations for the future, and patient education resources.  A copy of this summary, along with a letter will be sent to the patient's primary care provider via mail/fax/In Basket message after today's visit.    2. Bone health:  Given Ms. Kubisiak's history of breast cancer, she is at risk for bone demineralization.  She is taking Tamoxifen daily and I counseled her that it has a protective effect on the bones.  She was given education on specific activities to promote bone health.  3. Cancer screening:  Due to Ms. Ahlgren's history and her age, she should receive screening for skin cancers, colon cancer, and gynecologic cancers.  The information and  recommendations are listed on the patient's comprehensive care plan/treatment summary and were reviewed in detail with the patient.    4. Health maintenance and wellness promotion: Ms. Fahey was encouraged to consume 5-7 servings of fruits and vegetables per day. We reviewed the "Nutrition Rainbow" handout, as well as the handout "Take Control of Your Health and Reduce Your Cancer Risk" from the [employer].  She was also encouraged to engage in moderate to vigorous exercise for 30 minutes per day most days of the week. We discussed the [employer] fitness program, which is designed for cancer survivors to help them become more physically fit after cancer treatments.  She was instructed to limit her alcohol consumption and continue to abstain from tobacco use.     5. Support services/counseling: It is not uncommon for this period of the patient's cancer care trajectory to be one of many emotions and stressors.  We discussed an opportunity for her to participate in the next session of [facility] ("Finding Your New Normal") support group series designed for patients after they have completed treatment.   Ms. Vandenboom was encouraged to take advantage of our many other support services programs, support groups, and/or counseling in coping with her new life as a cancer survivor after completing anti-cancer treatment.  She was offered support today through active listening and expressive supportive counseling.  She was given information regarding our available services and encouraged to contact me with any questions or for help enrolling in any of our support group/programs.    Dispo:   -Return to cancer center in 3 months for f/u with Dr. Jana Hakim  -Mammogram due in 12/2019 -She is welcome to return back to the Survivorship Clinic at any time; no additional follow-up needed at this time.  -Consider referral back to survivorship as a long-term survivor for continued surveillance  A total of (30)  minutes of face-to-face time was spent with this patient with greater than 50% of that time in counseling and care-coordination.   Gardenia Phlegm, NP Survivorship  [place] [phone]   Note: PRIMARY CARE PROVIDER [name], [place] [phone]

## 2019-03-28 NOTE — Research (Signed)
03/28/2019 at 11:25am - [id number] month 12 study notes- The pt was into the cancer clinic today for her month 12 UpBEAT visit and her routine follow up with Dr. [name] NP, Gardenia Phlegm.  The pt was given her self-administered month 12 questionnaires to complete upon arrival to the cancer center.  The research nurse reviewed the completed questionnaire packet for completion and accuracy.  The nurse reviewed page 10 and screened the pt's responses for depression.  The pt's score was a 9, therefore no formal intervention is required per the protocol.  The pt confirmed that she had been fasting overnight for her month 12 labs.  The pt's research labs were drawn and shipped today.  The pt then met with the research nurse and completed her physical testing (6 minute walk, disability measures, etc).  The research nurse also reviewed with the pt her current medication list.  The pt denies starting any new cardiovascular medication.  The pt then had her waist measurement obtained, and it was 39 inches.  The pt then had her vitals and height and weight obtained prior to seeing the NP for her physical exam.  The pt met with the research assistant, Farris Has, and completed her month 12 neurocognitive booklet.  The pt was thanked for completing this important study visit.  The pt was given her gift card for completing all of her month 12 assessments.  The pt is aware that her next on-study visit will be at her month 24 visit in August 2021.  The research nurse will enter all of her month 12 data in [employer].   Brion Aliment RN, BSN, CCRP Clinical Research Nurse 03/28/2019 11:34 AM

## 2019-03-29 ENCOUNTER — Telehealth: Payer: Self-pay | Admitting: Oncology

## 2019-03-29 NOTE — Telephone Encounter (Signed)
I left a message regarding schedule  

## 2019-06-12 ENCOUNTER — Other Ambulatory Visit: Payer: Self-pay

## 2019-06-12 ENCOUNTER — Other Ambulatory Visit: Payer: Self-pay | Admitting: *Deleted

## 2019-06-12 DIAGNOSIS — C50211 Malignant neoplasm of upper-inner quadrant of right female breast: Secondary | ICD-10-CM

## 2019-06-12 DIAGNOSIS — Z170 Estrogen receptor positive status [ER+]: Secondary | ICD-10-CM

## 2019-06-13 ENCOUNTER — Other Ambulatory Visit: Payer: Self-pay

## 2019-06-13 ENCOUNTER — Inpatient Hospital Stay: Payer: 59 | Attending: Oncology

## 2019-06-13 DIAGNOSIS — Z923 Personal history of irradiation: Secondary | ICD-10-CM | POA: Diagnosis not present

## 2019-06-13 DIAGNOSIS — E559 Vitamin D deficiency, unspecified: Secondary | ICD-10-CM | POA: Insufficient documentation

## 2019-06-13 DIAGNOSIS — C50211 Malignant neoplasm of upper-inner quadrant of right female breast: Secondary | ICD-10-CM | POA: Diagnosis not present

## 2019-06-13 DIAGNOSIS — Z79810 Long term (current) use of selective estrogen receptor modulators (SERMs): Secondary | ICD-10-CM | POA: Diagnosis not present

## 2019-06-13 DIAGNOSIS — Z170 Estrogen receptor positive status [ER+]: Secondary | ICD-10-CM | POA: Diagnosis not present

## 2019-06-13 LAB — CBC WITH DIFFERENTIAL (CANCER CENTER ONLY)
Abs Immature Granulocytes: 0.02 10*3/uL (ref 0.00–0.07)
Basophils Absolute: 0 10*3/uL (ref 0.0–0.1)
Basophils Relative: 1 %
Eosinophils Absolute: 0 10*3/uL (ref 0.0–0.5)
Eosinophils Relative: 1 %
HCT: 41 % (ref 36.0–46.0)
Hemoglobin: 13.3 g/dL (ref 12.0–15.0)
Immature Granulocytes: 0 %
Lymphocytes Relative: 18 %
Lymphs Abs: 1 10*3/uL (ref 0.7–4.0)
MCH: 31.4 pg (ref 26.0–34.0)
MCHC: 32.4 g/dL (ref 30.0–36.0)
MCV: 96.7 fL (ref 80.0–100.0)
Monocytes Absolute: 0.4 10*3/uL (ref 0.1–1.0)
Monocytes Relative: 8 %
Neutro Abs: 4 10*3/uL (ref 1.7–7.7)
Neutrophils Relative %: 72 %
Platelet Count: 224 10*3/uL (ref 150–400)
RBC: 4.24 MIL/uL (ref 3.87–5.11)
RDW: 11.6 % (ref 11.5–15.5)
WBC Count: 5.5 10*3/uL (ref 4.0–10.5)
nRBC: 0 % (ref 0.0–0.2)

## 2019-06-13 LAB — CMP (CANCER CENTER ONLY)
ALT: 19 U/L (ref 0–44)
AST: 21 U/L (ref 15–41)
Albumin: 4.1 g/dL (ref 3.5–5.0)
Alkaline Phosphatase: 38 U/L (ref 38–126)
Anion gap: 10 (ref 5–15)
BUN: 10 mg/dL (ref 6–20)
CO2: 28 mmol/L (ref 22–32)
Calcium: 9.4 mg/dL (ref 8.9–10.3)
Chloride: 102 mmol/L (ref 98–111)
Creatinine: 0.8 mg/dL (ref 0.44–1.00)
GFR, Est AFR Am: >60 mL/min (ref >60)
GFR, Estimated: >60 mL/min (ref >60)
Glucose, Bld: 101 mg/dL — ABNORMAL HIGH (ref 70–99)
Potassium: 3.8 mmol/L (ref 3.5–5.1)
Sodium: 140 mmol/L (ref 135–145)
Total Bilirubin: 0.6 mg/dL (ref 0.3–1.2)
Total Protein: 7.2 g/dL (ref 6.5–8.1)

## 2019-06-13 LAB — VITAMIN D 25 HYDROXY (VIT D DEFICIENCY, FRACTURES): Vit D, 25-Hydroxy: 27.22 ng/mL — ABNORMAL LOW (ref 30–100)

## 2019-06-13 LAB — TSH: TSH: 1.808 u[IU]/mL (ref 0.308–3.960)

## 2019-06-13 LAB — T4, FREE: Free T4: 1.06 ng/dL (ref 0.61–1.12)

## 2019-06-14 ENCOUNTER — Other Ambulatory Visit: Payer: Self-pay | Admitting: Oncology

## 2019-06-14 ENCOUNTER — Encounter: Payer: Self-pay | Admitting: Oncology

## 2019-06-20 ENCOUNTER — Ambulatory Visit: Payer: Managed Care, Other (non HMO) | Admitting: Adult Health

## 2019-06-20 ENCOUNTER — Other Ambulatory Visit: Payer: Self-pay

## 2019-06-20 ENCOUNTER — Encounter: Payer: Self-pay | Admitting: Adult Health

## 2019-06-20 ENCOUNTER — Inpatient Hospital Stay (HOSPITAL_BASED_OUTPATIENT_CLINIC_OR_DEPARTMENT_OTHER): Payer: 59 | Admitting: Adult Health

## 2019-06-20 VITALS — BP 128/73 | HR 58 | Temp 97.2°F | Resp 18 | Ht 66.75 in | Wt 170.4 lb

## 2019-06-20 DIAGNOSIS — C50211 Malignant neoplasm of upper-inner quadrant of right female breast: Secondary | ICD-10-CM | POA: Diagnosis not present

## 2019-06-20 DIAGNOSIS — Z170 Estrogen receptor positive status [ER+]: Secondary | ICD-10-CM | POA: Diagnosis not present

## 2019-06-20 NOTE — Progress Notes (Signed)
Monica [name]  [phone] [phone]     ID: Monica [name] DOB: 1971/03/28  MR#: [id number]  IDP#:[id number]  Patient Care Team: [name], PA-C as PCP - General (Family Medicine) [name], MD as Consulting Physician (General Surgery) Magrinat, [name], MD as Consulting Physician (Oncology) [name], MD as Consulting Physician (Radiation Oncology) [name], MD as Consulting Physician (Obstetrics and Gynecology) Larey Dresser, MD as Consulting Physician (Cardiology) OTHER MD: [facility] D[facility]   CHIEF COMPLAINT: triple positive breast cancer  CURRENT TREATMENT: Tamoxifen   HISTORY OF CURRENT ILLNESS: From the original intake note:  Monica [name] (pronounced "uric") palpated a right supraclavicular mass and felt tenderness after stretching one day. She brought this to her PA's attention.  The patient underwent bilateral diagnostic mammography with tomography and right breast ultrasonography at [facility] on 12/20/2017 showing: breast density category C. There was a suspicious palpable mass at the 1 o'clock position upper inner quadrant measuring 1.4 x 1.3 x 1.3 cm located 12 cm from the nipple. There was an additional mass at the 8 o'clock radiant measuring 1.2 x 1.0 x 1.4 cm, which likely represented a cyst. Ultrasound evaluation of the right axilla demonstrates no evidence of lymphadenopathy  Accordingly on 12/29/2017 she proceeded to biopsy of the right breast area in question. The pathology from this procedure showed ([id number]): Invasive ductal carcinoma, grade III. Prognostic indicators significant for: estrogen receptor, 80% positive and progesterone receptor, 70% positive, both with strong staining intensity. Proliferation marker Ki67 at 80%. HER2 amplified with ratios HER2/CEP17 signals 3.44 and average HER2 copies per cell 10.65.  We are obtaining Monica [name] and estradiol levels today to assess her menopausal  status  The patient's subsequent history is as detailed below.   INTERVAL HISTORY: Monica [name] is here for f/u of her triple positive breast cancer.  Her mammogram was completed on 01/10/2019 and showed no evidence of malignancy and breast density category C.   She continues on Tamoxifen daily and has mild arthralgias, but these are minimal.  She denies hot flashes or vaginal wetness.    REVIEW OF SYSTEMS: Monica [name] is feeling well today.  She has no new issues today.  We talked about vitamin recommendations last week briefly when she was here.  She does have a split nail on her first digit of her right thumb.  She is exercising regularly by walking her dog, and occasional light weight lifting.  She denies any new issues today.    Monica [name] denies any fever, chills, chest pain, palpitations, cough, bowel/bladder issues, nausea, vomiting, headaches, or any other concerns.  A detailed ROS was otherwise non contributory.     PAST MEDICAL HISTORY: Past Medical History:  Diagnosis Date  . Arthritis    lower back  . Cancer [facility])    right breast cancer  . Family history of breast cancer   . Personal history of chemotherapy   . Personal history of radiation therapy   She notes that she had a heart murmur as a child, which she grew out of.   PAST SURGICAL HISTORY: Past Surgical History:  Procedure Laterality Date  . ABDOMINAL HYSTERECTOMY    . BREAST LUMPECTOMY Right   . BREAST LUMPECTOMY WITH RADIOACTIVE SEED AND SENTINEL LYMPH NODE BIOPSY Right 02/16/2018   Procedure: RIGHT BREAST LUMPECTOMY WITH RADIOACTIVE SEED AND SENTINEL LYMPH NODE BIOPSY;  Surgeon: [name], MD;  Location: [facility];  Service: General;  Laterality: Right;  . HYMENECTOMY    .  PORTACATH PLACEMENT Right 02/16/2018   Procedure: INSERTION PORT-A-CATH;  Surgeon: [name], MD;  Location: [facility];  Service: General;  Laterality: Right;  . RE-EXCISION OF BREAST LUMPECTOMY Right 02/22/2018    Procedure: RE-EXCISION OF RIGHT  BREAST LUMPECTOMY;  Surgeon: [name], MD;  Location: [facility];  Service: General;  Laterality: Right;  . REPAIR VAGINAL CUFF N/A 02/07/2017   Procedure: REPAIR VAGINAL CUFF;  Surgeon: Ena Dawley, MD;  Location: [facility];  Service: Gynecology;  Laterality: N/A;  . ROBOTIC ASSISTED TOTAL HYSTERECTOMY WITH SALPINGECTOMY Left 01/20/2017   Procedure: ROBOTIC ASSISTED TOTAL HYSTERECTOMY WITH SALPINGECTOMY;  Surgeon: [name], MD;  Location: [facility];  Service: Gynecology;  Laterality: Left;  Partial Hysterectomy without BSO  FAMILY HISTORY Family History  Problem Relation Age of Onset  . Lung cancer Maternal Grandfather   . Esophageal cancer Paternal Grandfather 49  . Breast cancer Cousin 9   As of July 2019, the patient's father is alive at 53. The patient's mother is also alive at 66. The patient has 1 brother and 5 sisters. There was a maternal grandfather diagnosed with lung cancer at 29. There was a paternal grandfather diagnosed with esophageal cancer at 69. The patient's father had a pre-cancerous esophageal finding at age 61. There was also a maternal 1st cousin diagnosed with metastatic breast cancer at age 35.    GYNECOLOGIC HISTORY:  Patient's last menstrual period was 12/21/2016 (exact date). Menarche: 48 years old Age at first live birth: 48 years old She is GXP2. She is status post partial hysterectomy without BSO in 2018 She never used HRT. She used oral contraception over 21 years ago with no complications.   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 The patient is separated from her husband, Monica [name]. The patient's son Monica [name], age 40, is in the [place] [employer] and is being stationed in [place] for 3 years beginning January 2020. He plans on working in the railroad industry after he returns. The patient's daughter Monica [name], age 16, works at a [employer].     ADVANCED DIRECTIVES: Not in place   HEALTH MAINTENANCE: Social History   Tobacco Use  . Smoking status: Never Smoker  . Smokeless tobacco: Never Used  Substance Use Topics  . Alcohol use: Yes    Comment: occ  . Drug use: No     Colonoscopy:   PAP: 2018/ prior to hysterectomy  Bone density:   No Known Allergies  Current Outpatient Medications  Medication Sig Dispense Refill  . Acetaminophen (TYLENOL PO) Take by mouth as needed. 2 tabs as needed for aches and pains     . ibuprofen (ADVIL,MOTRIN) 800 MG tablet Take 1 tablet (800 mg total) by mouth every 8 (eight) hours as needed. (Patient taking differently: Take 800 mg by mouth as needed. ) 30 tablet 0  . loratadine (CLARITIN) 10 MG tablet Take 10 mg by mouth daily as needed.     Marland Kitchen NAPROXEN PO Take by mouth as needed. 1 tab as needed for aches and pain    . Probiotic Product (PROBIOTIC PO) Take by mouth daily.    . tamoxifen (NOLVADEX) 20 MG tablet Take 20 mg by mouth daily.     No current facility-administered medications for this visit.     OBJECTIVE:  Vitals:   06/20/19 0811  BP: 128/73  Pulse: (!) 58  Resp: 18  Temp: (!) 97.2 F (36.2 C)  SpO2:  100%     Body mass index is 26.89 kg/m.   Wt Readings from Last 3 Encounters:  06/20/19 170 lb 6.4 oz (77.3 kg)  03/28/19 172 lb 11.2 oz (78.3 kg)  12/23/18 174 lb 8 oz (79.2 kg)  ECOG FS:1 GENERAL: Patient is a well appearing female in no acute distress HEENT:  Sclerae anicteric.  Oropharynx clear and moist. No ulcerations or evidence of oropharyngeal candidiasis. Neck is supple.  NODES:  No cervical, supraclavicular, or axillary lymphadenopathy palpated.  BREAST EXAM:  Right breast s/p lumpectomy and radiation, no sign of local recurrence left breast benign LUNGS:  Clear to auscultation bilaterally.  No wheezes or rhonchi. HEART:  Regular rate and rhythm. No murmur appreciated. ABDOMEN:  Soft, nontender.  Positive, normoactive bowel sounds. No  organomegaly palpated. MSK:  No focal spinal tenderness to palpation. Full range of motion bilaterally in the upper extremities. EXTREMITIES:  No peripheral edema.   SKIN:  Clear with no obvious rashes or skin changes. No nail dyscrasia. NEURO:  Nonfocal. Well oriented.  Appropriate affect.     LAB RESULTS:  CMP     Component Value Date/Time   NA 140 06/13/2019 1138   K 3.8 06/13/2019 1138   CL 102 06/13/2019 1138   CO2 28 06/13/2019 1138   GLUCOSE 101 (H) 06/13/2019 1138   BUN 10 06/13/2019 1138   CREATININE 0.80 06/13/2019 1138   CALCIUM 9.4 06/13/2019 1138   PROT 7.2 06/13/2019 1138   ALBUMIN 4.1 06/13/2019 1138   AST 21 06/13/2019 1138   ALT 19 06/13/2019 1138   ALKPHOS 38 06/13/2019 1138   BILITOT 0.6 06/13/2019 1138   GFRNONAA >60 06/13/2019 1138   GFRAA >60 06/13/2019 1138    No results found for: TOTALPROTELP, ALBUMINELP, A1GS, A2GS, BETS, BETA2SER, GAMS, MSPIKE, SPEI  No results found for: KPAFRELGTCHN, LAMBDASER, KAPLAMBRATIO  Lab Results  Component Value Date   WBC 5.5 06/13/2019   NEUTROABS 4.0 06/13/2019   HGB 13.3 06/13/2019   HCT 41.0 06/13/2019   MCV 96.7 06/13/2019   PLT 224 06/13/2019    _0 @  No results found for: LABCA2  No components found for: [id number]  No results for input(s): INR in the last 168 hours.  No results found for: LABCA2  No results found for: WYO378  No results found for: HYI502  No results found for: DXA128  No results found for: CA2729  No components found for: HGQUANT  No results found for: CEA1 / No results found for: CEA1   No results found for: AFPTUMOR  No results found for: CHROMOGRNA  No results found for: PSA1  No visits with results within 3 Day(s) from this visit.  Latest known visit with results is:  Appointment on 06/13/2019  Component Date Value Ref Range Status  . WBC Count 06/13/2019 5.5  4.0 - 10.5 K/uL Final  . RBC 06/13/2019 4.24  3.87 - 5.11 MIL/uL Final  . Hemoglobin  06/13/2019 13.3  12.0 - 15.0 g/dL Final  . HCT 06/13/2019 41.0  36.0 - 46.0 % Final  . MCV 06/13/2019 96.7  80.0 - 100.0 fL Final  . MCH 06/13/2019 31.4  26.0 - 34.0 pg Final  . MCHC 06/13/2019 32.4  30.0 - 36.0 g/dL Final  . RDW 06/13/2019 11.6  11.5 - 15.5 % Final  . Platelet Count 06/13/2019 224  150 - 400 K/uL Final  . nRBC 06/13/2019 0.0  0.0 - 0.2 % Final  . Neutrophils Relative % 06/13/2019 72  %  Final  . Neutro Abs 06/13/2019 4.0  1.7 - 7.7 K/uL Final  . Lymphocytes Relative 06/13/2019 18  % Final  . Lymphs Abs 06/13/2019 1.0  0.7 - 4.0 K/uL Final  . Monocytes Relative 06/13/2019 8  % Final  . Monocytes Absolute 06/13/2019 0.4  0.1 - 1.0 K/uL Final  . Eosinophils Relative 06/13/2019 1  % Final  . Eosinophils Absolute 06/13/2019 0.0  0.0 - 0.5 K/uL Final  . Basophils Relative 06/13/2019 1  % Final  . Basophils Absolute 06/13/2019 0.0  0.0 - 0.1 K/uL Final  . Immature Granulocytes 06/13/2019 0  % Final  . Abs Immature Granulocytes 06/13/2019 0.02  0.00 - 0.07 K/uL Final   Performed at [facility], [place] [address] [place], [place] [address]  . Sodium 06/13/2019 140  135 - 145 mmol/L Final  . Potassium 06/13/2019 3.8  3.5 - 5.1 mmol/L Final  . Chloride 06/13/2019 102  98 - 111 mmol/L Final  . CO2 06/13/2019 28  22 - 32 mmol/L Final  . Glucose, Bld 06/13/2019 101* 70 - 99 mg/dL Final  . BUN 06/13/2019 10  6 - 20 mg/dL Final  . Creatinine 06/13/2019 0.80  0.44 - 1.00 mg/dL Final  . Calcium 06/13/2019 9.4  8.9 - 10.3 mg/dL Final  . Total Protein 06/13/2019 7.2  6.5 - 8.1 g/dL Final  . Albumin 06/13/2019 4.1  3.5 - 5.0 g/dL Final  . AST 06/13/2019 21  15 - 41 U/L Final  . ALT 06/13/2019 19  0 - 44 U/L Final  . Alkaline Phosphatase 06/13/2019 38  38 - 126 U/L Final  . Total Bilirubin 06/13/2019 0.6  0.3 - 1.2 mg/dL Final  . GFR, Est Non Af Am 06/13/2019 >60  >60 mL/min Final  . GFR, Est AFR Am 06/13/2019 >60  >60 mL/min Final  . Anion gap 06/13/2019 10  5  - 15 Final   Performed at [facility], [place] [address], [place], [place] [address]  . Vit D, 25-Hydroxy 06/13/2019 27.22* 30 - 100 ng/mL Final   Comment: (NOTE) Vitamin D deficiency has been defined by the [place] practice guideline as a level of serum 25-OH  vitamin D less than 20 ng/mL (1,2). The Endocrine Society went on to  further define vitamin D insufficiency as a level between 21 and 29  ng/mL (2). 1. IOM (Institute of Medicine). 2010. Dietary reference intakes for  calcium and D. [place]: The [employer]. 2. Holick MF, Binkley Sarpy, Bischoff-Ferrari HA, et al. Evaluation,  treatment, and prevention of vitamin D deficiency: an Endocrine  Society clinical practice guideline, JCEM. 2011 Jul; 96(7): 1911-30. Performed at [facility], [place] [address], [place], [place] [address]   . TSH 06/13/2019 1.808  0.308 - 3.960 uIU/mL Final   Performed at [facility], [place] [address] [place], [place] [address]  . Free T4 06/13/2019 1.06  0.61 - 1.12 ng/dL Final   Comment: (NOTE) Biotin ingestion may interfere with free T4 tests. If the results are inconsistent with the TSH level, previous test results, or the clinical presentation, then consider biotin interference. If needed, order repeat testing after stopping biotin. Performed at [facility], [place] [address], [place], [place] [address]     (this displays the last labs from the last 3 days)  No results found for: TOTALPROTELP, ALBUMINELP, A1GS, A2GS, BETS, BETA2SER, GAMS, MSPIKE, SPEI (this  displays SPEP labs)  No results found for: KPAFRELGTCHN, LAMBDASER, KAPLAMBRATIO (kappa/lambda light chains)  No results found for: HGBA, HGBA2QUANT, HGBFQUANT, HGBSQUAN (Hemoglobinopathy evaluation)   No results found for: LDH  No results found for: IRON, TIBC, IRONPCTSAT (Iron and TIBC)  No results found for: FERRITIN   Urinalysis No results found for: COLORURINE, APPEARANCEUR, LABSPEC, PHURINE, GLUCOSEU, HGBUR, BILIRUBINUR, KETONESUR, PROTEINUR, UROBILINOGEN, NITRITE, LEUKOCYTESUR   STUDIES:  CLINICAL DATA:  History of RIGHT breast cancer in an 2019 status post and radiation therapy.  EXAM: DIGITAL DIAGNOSTIC BILATERAL MAMMOGRAM WITH CAD AND TOMO  COMPARISON:  Previous exam(s).  ACR Breast Density Category c: The breast tissue is heterogeneously dense, which may obscure small masses.  FINDINGS: There are expected postsurgical changes within the upper RIGHT breast. There are no new dominant masses, suspicious calcifications or secondary signs of malignancy within either breast.  Mammographic images were processed with CAD.  IMPRESSION: No evidence of malignancy within either breast. Expected postsurgical changes within the RIGHT breast.  RECOMMENDATION: Bilateral diagnostic mammogram in 1 year.  I have discussed the findings and recommendations with the patient. Results were also provided in writing at the conclusion of the visit. If applicable, a reminder letter will be sent to the patient regarding the next appointment.  BI-RADS CATEGORY  2: Benign.   Electronically Signed   By: Franki Cabot M.D.   On: 01/10/2019 10:11  ELIGIBLE FOR AVAILABLE RESEARCH PROTOCOL: BCEP  ASSESSMENT: 48 y.o.  Monica [name], Monica [name] woman status post right breast upper inner quadrant biopsy 12/29/2017, for a clinical T1c N0, stage IA invasive ductal carcinoma, triple positive, with an MIB-1 of 80%.  (1) genetics testing 02/02/2018 though the CancerNext gene panel offered by [facility] showed no deleterious mutations in  APC, ATM, BARD1, BMPR1A,BRCA1, BRCA2, BRIP1, CDH1, CDK4, CDKN2A, CHEK2, DICER1, HOXB13, MLH1, MRE11A, MSH2, MSH6, MUTYH, NBN, NF1, PALB2, PMS2, POLD1, POLE, PTEN, RAD50, RAD51C, RAD51D, SMAD4, SMARCA4, STK11 and TP53 (sequencing and deletion/duplication); EPCAM and GREM1  (deletion/duplication only).  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>A) )   (2) right lumpectomy and sentinel lymph node sampling 02/16/2018 showed a pT2 pN0, stage IB invasive ductal carcinoma, grade 3, with a positive inferior margin; a total of 5 lymph nodes were removed  (a) additional surgery 02/27/2018 cleared the margins  (3) started carboplatin, docetaxel, trastuzumab and pertuzumab 03/11/2018, repeated every 21 days x 6, last dose 07/18/2018.    (a) Pertuzumab omitted after cycle 1 due to diarrhea.  (b) Gemcitabine substituted for Docetaxel starting with cycle 5 due to peripheral neuropathy  (4) continued trastuzumab to total 6 months (through February 2020).  (a) echocardiogram 01/21/2018 showed an ejection fraction in the 55-60% range  (b) repeat echocardiogram 06/14/2018 showed an ejection fraction in the 60-65%  (5) adjuvant radiation 08/15/2018 - 09/28/2018 Site/dose:   The patient initially received a dose of 50.4 Gy in 28 fractions to the right breast using whole-breast tangent fields. This was delivered using a 3-D conformal technique. The patient then received a boost to the seroma. This delivered an additional 10 Gy in 5 fractions using 12E, 9E electrons with a special teletherapy technique. The total dose was 60.4 Gy.   (6) started tamoxifen March 2020  (a) the patient is status post hysterectomy, wihtout bilateral salpingo-oophorectomy  (b) Goff and estradiol levels June 2020 show she is pre menopausaul  (c) will recheck 06/2020   PLAN:  Maleyah is doing well today.  She has no clinical or radiographic  sign of breast cancer recurrence.  She continues on Tamoxifen daily with good tolerance.  She will continue on this.    She does have vitamin d deficiency.  She will check her supplement today and let us know how much she is taking so we can either adjust or send in a prescription.  She will return in 3 months for recheck of her level.   Her mammogram is due in 12/2019; orders placed today.    She will continue with healthy diet and exercise.  We talked about that in detail.  For her nail I recommended a protein nail strengthening polish to apply.    Mckaela will see Dr. Brantley Stage in 6 months, and Dr. Jana Hakim in 1 year.  She was recommended to continue with the appropriate pandemic precautions. She knows to call for any questions that may arise between now and her next appointment.  We are happy to see her sooner if needed.  A total of (30) minutes of face-to-face time was spent with this patient with greater than 50% of that time in counseling and care-coordination.  Wilber Bihari, NP  06/20/19 8:25 AM Medical Oncology and Hematology [facility] [address], [place] [address] Tel. [phone]    Fax. [phone]

## 2019-06-21 ENCOUNTER — Telehealth: Payer: Self-pay | Admitting: Adult Health

## 2019-06-21 ENCOUNTER — Encounter: Payer: Self-pay | Admitting: Adult Health

## 2019-06-21 NOTE — Telephone Encounter (Signed)
I left a message regarding schedule  

## 2019-07-03 ENCOUNTER — Other Ambulatory Visit: Payer: Managed Care, Other (non HMO)

## 2019-07-04 ENCOUNTER — Encounter: Payer: Self-pay | Admitting: Adult Health

## 2019-07-05 ENCOUNTER — Telehealth: Payer: Self-pay

## 2019-07-05 ENCOUNTER — Encounter: Payer: Self-pay | Admitting: Adult Health

## 2019-07-05 NOTE — Telephone Encounter (Signed)
TC to Pt returning message from my chart. Pt. Inquiring about getting vaccination for covid 19. Per Dr. Andrey Spearman for Pt to get vaccination. Pt. Verbalized understanding.

## 2019-08-02 NOTE — Telephone Encounter (Signed)
No entry 

## 2019-09-15 ENCOUNTER — Other Ambulatory Visit: Payer: Self-pay | Admitting: *Deleted

## 2019-09-18 ENCOUNTER — Inpatient Hospital Stay: Payer: 59 | Attending: Oncology

## 2019-09-18 ENCOUNTER — Other Ambulatory Visit: Payer: Self-pay

## 2019-09-18 DIAGNOSIS — Z170 Estrogen receptor positive status [ER+]: Secondary | ICD-10-CM | POA: Insufficient documentation

## 2019-09-18 DIAGNOSIS — C50211 Malignant neoplasm of upper-inner quadrant of right female breast: Secondary | ICD-10-CM | POA: Insufficient documentation

## 2019-09-18 LAB — VITAMIN D 25 HYDROXY (VIT D DEFICIENCY, FRACTURES): Vit D, 25-Hydroxy: 35.5 ng/mL (ref 30–100)

## 2019-09-19 ENCOUNTER — Telehealth: Payer: Self-pay | Admitting: *Deleted

## 2019-09-19 NOTE — Telephone Encounter (Signed)
Per Wilber Bihari, NP, called to make pt aware that her Vitamin D is improving and continue on supplementations. Voice message was left of pt personal cell. Advised that if there were any other concerns or questions to call office.

## 2019-10-05 ENCOUNTER — Other Ambulatory Visit: Payer: Self-pay | Admitting: Adult Health

## 2019-10-05 ENCOUNTER — Encounter: Payer: Self-pay | Admitting: Adult Health

## 2019-10-05 DIAGNOSIS — C50211 Malignant neoplasm of upper-inner quadrant of right female breast: Secondary | ICD-10-CM

## 2019-10-05 DIAGNOSIS — Z170 Estrogen receptor positive status [ER+]: Secondary | ICD-10-CM

## 2019-10-05 MED ORDER — TAMOXIFEN CITRATE 20 MG PO TABS: 20.0000 mg | ORAL_TABLET | Freq: Every day | ORAL | 3 refills | Status: DC

## 2019-11-28 ENCOUNTER — Encounter: Payer: Self-pay | Admitting: Adult Health

## 2020-01-11 ENCOUNTER — Ambulatory Visit
Admission: RE | Admit: 2020-01-11 | Discharge: 2020-01-11 | Disposition: A | Discharge status: Home / Self Care | Payer: No Typology Code available for payment source | Source: Ambulatory Visit | Attending: Adult Health | Admitting: Adult Health

## 2020-01-11 ENCOUNTER — Other Ambulatory Visit: Payer: Self-pay

## 2020-01-11 DIAGNOSIS — Z170 Estrogen receptor positive status [ER+]: Secondary | ICD-10-CM

## 2020-01-11 DIAGNOSIS — C50211 Malignant neoplasm of upper-inner quadrant of right female breast: Secondary | ICD-10-CM

## 2020-01-11 IMAGING — MG DIGITAL DIAGNOSTIC BILAT W/ TOMO W/ CAD
6 of 9 series · 6 of 25 positions shown · non-contrast
Comparison: Previous exam(s).

CLINICAL DATA: Status post right lumpectomy, radiation therapy
chemotherapy breast cancer in [KW].

EXAM:
DIGITAL DIAGNOSTIC BILATERAL MAMMOGRAM WITH TOMO AND CAD

[R MLO]
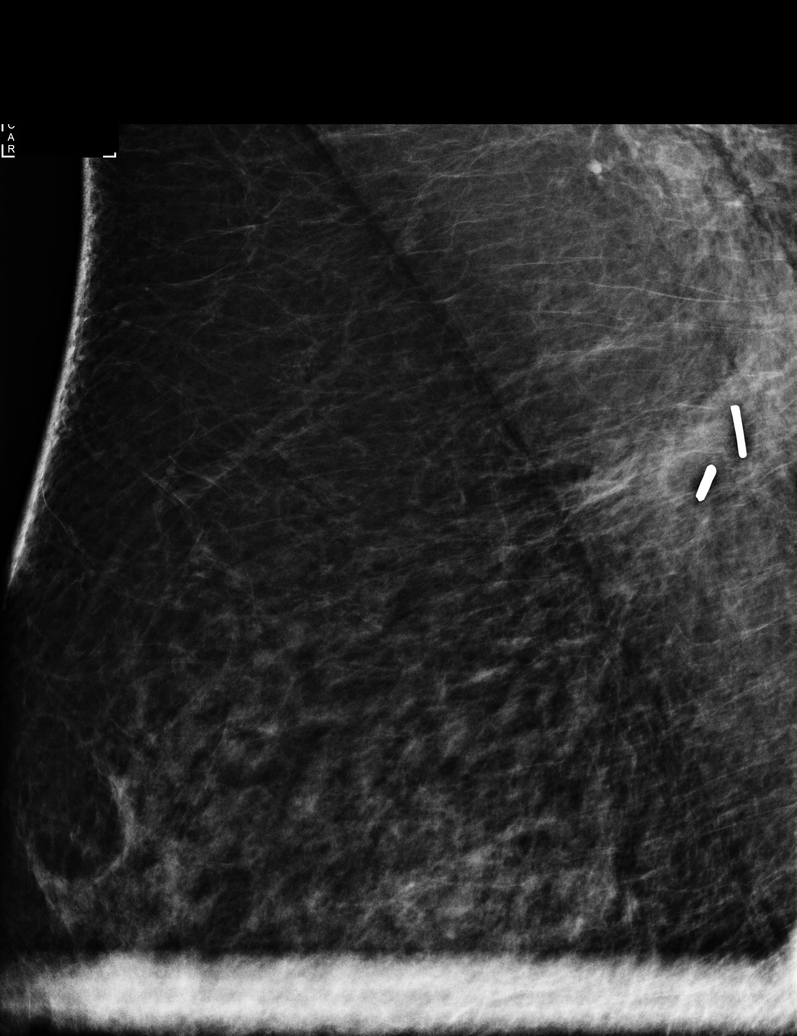

[R CC synth-2D]
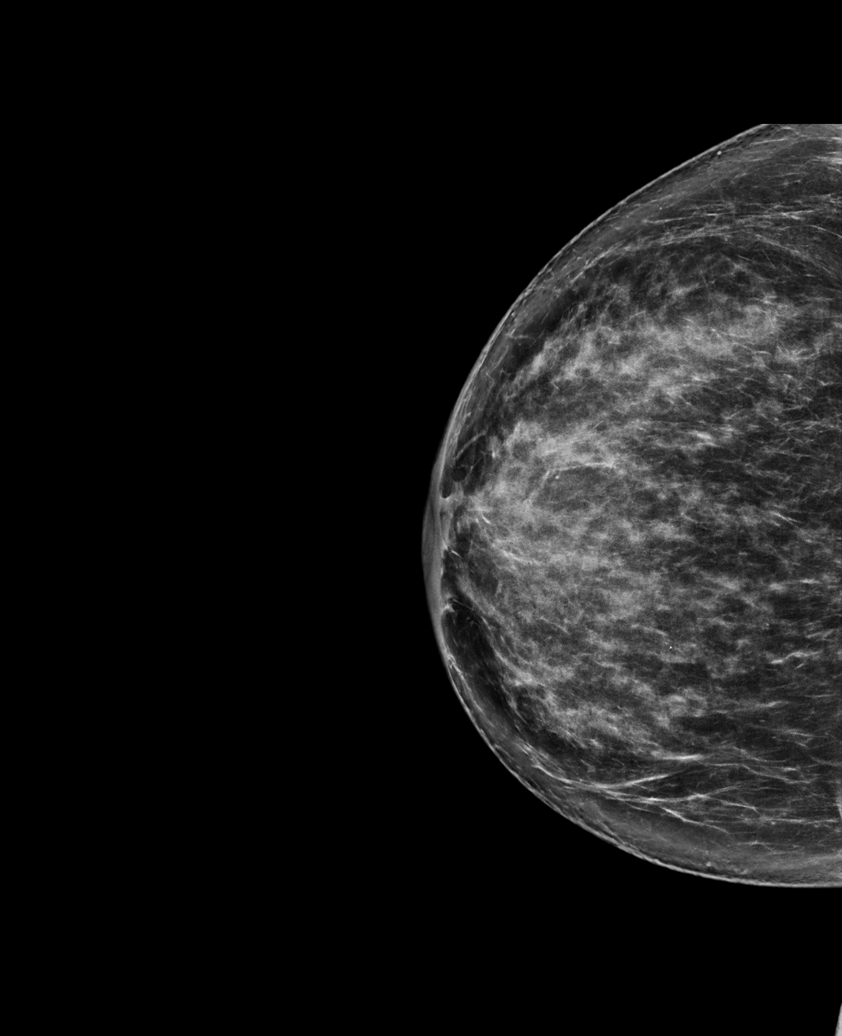

[L CC synth-2D]
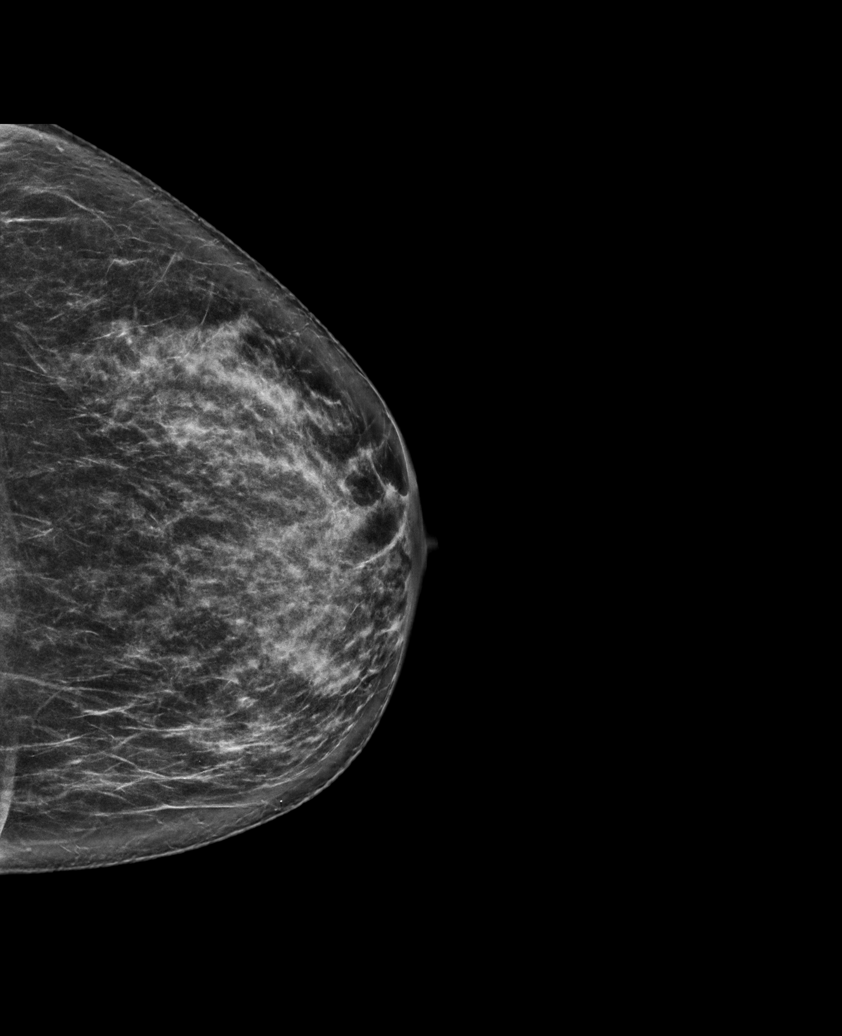

[R MLO synth-2D]
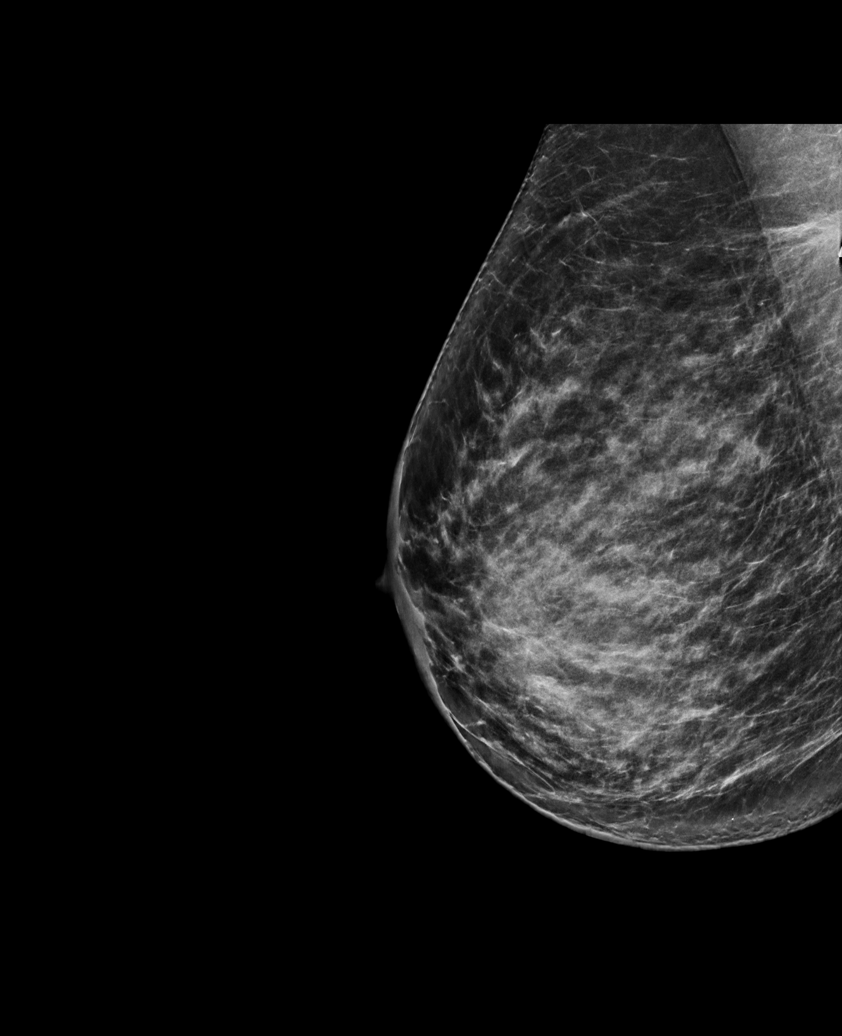

[L MLO synth-2D]
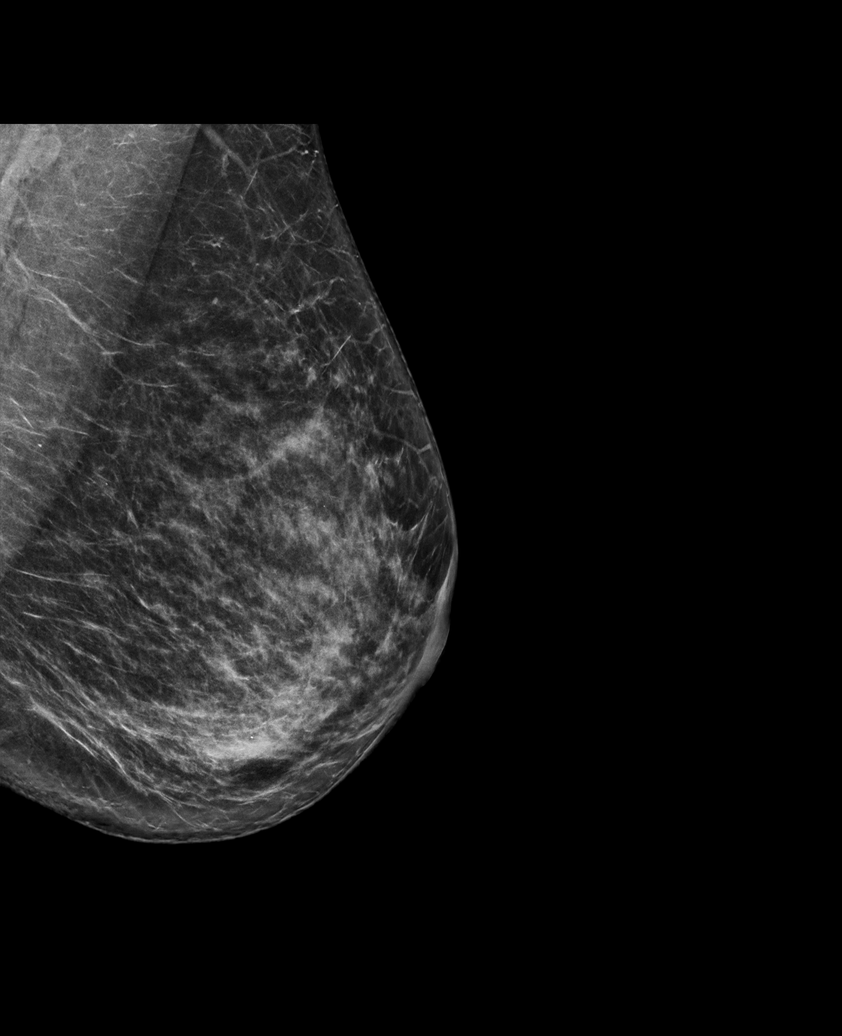

[R MLO tomo · tomo slice 43/84.0]
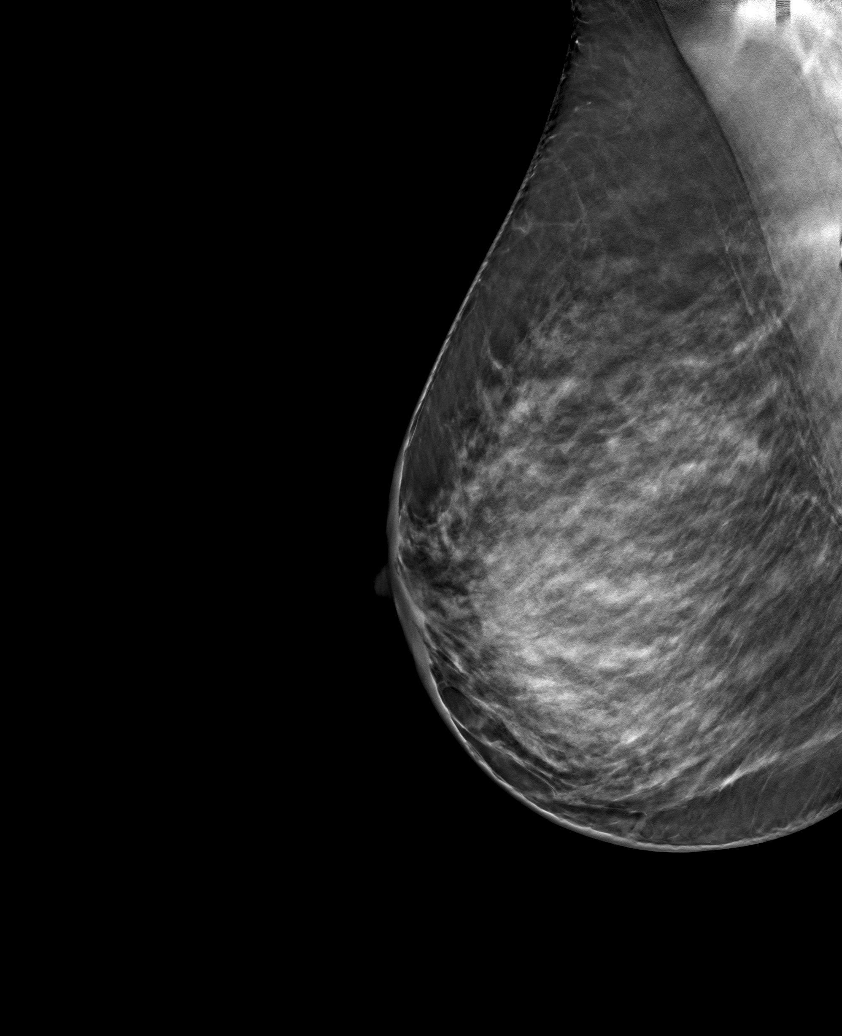

[6 of 25 positions shown; findings below may reference images not displayed]

ACR Breast Density Category c: The breast tissue is heterogeneously
dense, which may obscure small masses.
FINDINGS: Stable post lumpectomy changes on the right. No interval findings
suspicious for malignancy in either breast.

Mammographic images were processed with CAD.
IMPRESSION: No evidence of malignancy.

RECOMMENDATION:
Bilateral diagnostic mammogram in 1 year.

I have discussed the findings and recommendations with the patient.
If applicable, a reminder letter will be sent to the patient
regarding the next appointment.

BI-RADS CATEGORY  2: Benign.

## 2020-02-15 ENCOUNTER — Other Ambulatory Visit (HOSPITAL_COMMUNITY): Payer: Self-pay | Admitting: Hematology and Oncology

## 2020-02-15 ENCOUNTER — Other Ambulatory Visit (HOSPITAL_COMMUNITY): Payer: Self-pay | Admitting: Family Medicine

## 2020-02-15 ENCOUNTER — Other Ambulatory Visit: Payer: Self-pay | Admitting: *Deleted

## 2020-02-15 DIAGNOSIS — Z170 Estrogen receptor positive status [ER+]: Secondary | ICD-10-CM

## 2020-02-15 DIAGNOSIS — C50211 Malignant neoplasm of upper-inner quadrant of right female breast: Secondary | ICD-10-CM

## 2020-02-15 DIAGNOSIS — Z006 Encounter for examination for normal comparison and control in clinical research program: Secondary | ICD-10-CM

## 2020-03-29 ENCOUNTER — Telehealth: Payer: Self-pay

## 2020-03-29 NOTE — Telephone Encounter (Signed)
UPBEAT [id number] - UNDERSTANDING and PREDICTING BREAST CANCER EVENTS AFTER TREATMENT  03/29/2020 1252PM  OUTGOING CALL: Outgoing call to [place] introducing myself as a Marine scientist for [facility], seeking to inform her of a new 19-month questionnaire recently added to the UPBEAT study and the option to send questionnaires via email should she desire to do so. Requested direct call back to my number. Current plan is to await call back and attempt to reach [place] again next week prior to her scheduled 25-month visit.  Dionne Bucy. Sharlett Iles, BSN, RN, CIC 03/29/2020 12:55 PM

## 2020-04-04 ENCOUNTER — Telehealth: Payer: Self-pay | Admitting: *Deleted

## 2020-04-04 NOTE — Telephone Encounter (Signed)
Called patient and LVM with date and time reminding her of upcoming lab appointment, fast 3 hours prior to labs being drawn and to wear comfortable clothes for walking and ROM exercises.

## 2020-04-05 ENCOUNTER — Inpatient Hospital Stay: Payer: No Typology Code available for payment source

## 2020-04-05 ENCOUNTER — Other Ambulatory Visit: Payer: Self-pay

## 2020-04-05 ENCOUNTER — Inpatient Hospital Stay: Payer: No Typology Code available for payment source | Attending: Oncology

## 2020-04-05 ENCOUNTER — Ambulatory Visit (HOSPITAL_COMMUNITY): Admission: RE | Admit: 2020-04-05 | Payer: Self-pay | Source: Ambulatory Visit

## 2020-04-05 DIAGNOSIS — Z170 Estrogen receptor positive status [ER+]: Secondary | ICD-10-CM

## 2020-04-05 DIAGNOSIS — C50211 Malignant neoplasm of upper-inner quadrant of right female breast: Secondary | ICD-10-CM

## 2020-04-05 LAB — RESEARCH LABS

## 2020-04-05 NOTE — Research (Signed)
UPBEAT [id number] - UNDERSTANDING and PREDICTING BREAST CANCER EVENTS AFTER TREATMENT  04/05/2020 [id number]  Monica [name] presents to the cancer center this morning to complete 7-month assessments for the [id number] UPBEAT study.  LABS: Mandatory and optional labs are collected per protocol: [place] tolerates without complaint.  CARDIAC MRI: The cardiac MRI originally scheduled for today must be rescheduled due to scheduling conflicts with the MRI department. The number to call to reschedule is provided to Monica [name] and she states she will call for an appointment but wanted to come in and complete the other assessments for today.  VITAL SIGNS/HEIGHT/WEIGHT: Vital signs, height and weight are collected by this nurse per study protocol.  CONSENT ADDENDUM (NEW QUESTIONNAIRE): The UPBEAT study has a new social questionnaire requiring consent addendum. The consent addendum script (protocol version date 02/15/20, [place] active date 03/22/20) is read to Monica [name] and she consents to completing the self-administered questionnaire today during her 55-month visit (no to email).   NEUROCOGNITIVE TESTING: The neurocognitive tests are administered by trained clinical research specialist Remer Macho.  QUESTIONNAIRES: Remaining required questionnaires are completed by Monica [name]; all documents are checked for completeness. Monica [name] denies any cardiovascular events since her last visit.  MED REVIEW: Monica [name] reviews her current medication list and verifies as complete and accurate.  PHYSICAL ASSESSMENT: The required 59-month physical assessment tests are completed by this nurse; Monica [name] completes all tests without complaint.  GIFT CARD: Monica [name] is advised that her 3-month gift card will be distributed upon completion of the 56-month cardiac MRI: she verbalizes understanding.  Monica [name] is thanked for her time and continued participation in the UPBEAT study. A business card with my direct contact information is provided and she is encouraged to  contact either myself or [name] for any needs or questions: she verbalizes understanding.   Current plan is to monitor for rescheduling of the required cardiac MRI and provide a gift card upon completion.  Monica [name]. Sharlett Iles, BSN, RN, CIC 04/05/2020 11:37 AM

## 2020-04-08 ENCOUNTER — Encounter: Payer: Self-pay | Admitting: Adult Health

## 2020-04-09 ENCOUNTER — Telehealth: Payer: Self-pay

## 2020-04-09 DIAGNOSIS — Z170 Estrogen receptor positive status [ER+]: Secondary | ICD-10-CM

## 2020-04-09 DIAGNOSIS — C50211 Malignant neoplasm of upper-inner quadrant of right female breast: Secondary | ICD-10-CM

## 2020-04-09 NOTE — Telephone Encounter (Signed)
UPBEAT [id number] - UNDERSTANDING and PREDICTING BREAST CANCER EVENTS AFTER TREATMENT  04/09/2020 13:54PM  INCOMING CALL: Voicemail message received from [place] stating her cardiac MRI is scheduled for tomorrow. Current plan is to meet Christyanna upon completion of cMRI tomorrow with her $25 gift card per study protocol for completion of 49-month activities.   Dionne Bucy. Sharlett Iles, BSN, RN, CIC 04/09/2020 2:19 PM

## 2020-04-10 ENCOUNTER — Ambulatory Visit (HOSPITAL_COMMUNITY)
Admission: RE | Admit: 2020-04-10 | Discharge: 2020-04-10 | Disposition: A | Discharge status: Home / Self Care | Payer: Self-pay | Source: Ambulatory Visit | Attending: Hematology and Oncology | Admitting: Hematology and Oncology

## 2020-04-10 ENCOUNTER — Other Ambulatory Visit: Payer: Self-pay

## 2020-04-10 DIAGNOSIS — Z006 Encounter for examination for normal comparison and control in clinical research program: Secondary | ICD-10-CM | POA: Insufficient documentation

## 2020-04-10 DIAGNOSIS — Z170 Estrogen receptor positive status [ER+]: Secondary | ICD-10-CM

## 2020-04-10 DIAGNOSIS — C50211 Malignant neoplasm of upper-inner quadrant of right female breast: Secondary | ICD-10-CM

## 2020-04-10 NOTE — Research (Signed)
UPBEAT [id number] - UNDERSTANDING and PREDICTING BREAST CANCER EVENTS AFTER TREATMENT  04/10/2020 14:00PM  Monica [name] presents to the [facility] unaccompanied this afternoon to complete the required 66-month cardiac MRI for the [id number] UPBEAT study.  CARDIAC MRI: The cardiac MRI is completed as planned and Monica [name] tolerates the procedure without complaint.   GIFT CARD: Upon completion of MRI, Monica [name] is provided a $25 gift card per study protocol for participation: Monica [name] signs for receipt of the card.   Monica [name] is thanked for her time and continued participation in the [place] study and she is encouraged to contact either myself or [name] for any needs or questions: she verbalizes understanding.   Clinical research nurse [name] is updated on Monica [name]'s status.  Dionne Bucy. Sharlett Iles, BSN, RN, CIC 04/10/2020 2:21 PM

## 2020-04-25 ENCOUNTER — Telehealth: Payer: Self-pay | Admitting: *Deleted

## 2020-04-25 NOTE — Telephone Encounter (Signed)
Late entry note from 04/24/20.  Research nurse received pt's [facility] MRI clinical report on 04/24/20.  Research nurse notified Dr. Jana Hakim about the pt's report findings of "negative for alert findings".  Then, the nurse attempted to reach the pt by phone to notify her about the MRI clinical report.  The nurse left the pt a voice message informing her that her cardiac MRI was "negative for alert findings".  The pt was thanked for her support and compliance in the study over the past 2 years.  The pt was informed that she is now in study follow up with yearly phone calls.  The pt was told that Carol Ada, [place] Coordinator, will conduct her yearly UpBeat calls.  The pt was told to call the research nurse if she has any questions or concerns about the study.  Brion Aliment RN, BSN, CCRP Clinical Research Nurse 04/25/2020 12:02 PM

## 2020-06-19 NOTE — Progress Notes (Addendum)
[facility]  [phone] [phone]     ID: Monica [name] DOB: 1971-03-03  MR#: [id number]  TGG#:[id number]  Patient Care Team: [name], PA-C as PCP - General (Family Medicine) [name], MD as Consulting Physician (General Surgery) Donyell Carrell, [name], MD as Consulting Physician (Oncology) [name], MD as Consulting Physician (Radiation Oncology) [name], MD as Consulting Physician (Obstetrics and Gynecology) Larey Dresser, MD as Consulting Physician (Cardiology) OTHER MD: [facility]   CHIEF COMPLAINT: triple positive breast cancer  CURRENT TREATMENT: Tamoxifen   INTERVAL HISTORY: Monica [name] is here for follow up of her triple positive breast cancer.  She continues on Tamoxifen.  She tolerates that with no significant side effects  Since her last visit, she underwent bilateral diagnostic mammography with tomography at [facility] on 01/11/2020 showing: breast density category C; no evidence of malignancy in either breast.  She also underwent cardiac MRI on 04/10/2020 as part of the Upbeat research study. This was negative for alert findings.   REVIEW OF SYSTEMS: Monica [name] tells me her social life has improved and that is a very positive thing.  Also her son has sent her a form so she can travel to [place] and be with him and his wife this Christmas.  It has caused her to gain a little bit of weight.  For exercise she walks about twice a week about 30 minutes at a time.  She has a couple of skin changes in her right breast she wanted me to look at today.  A detailed review of systems today was otherwise entirely negative   COVID 19 VACCINATION STATUS: Status post Moderna x2, no booster as of December 2021   HISTORY OF CURRENT ILLNESS: From the original intake note:  Monica [name] (pronounced "uric") palpated a right supraclavicular mass and felt tenderness after stretching one day. She brought this to her  PA's attention.  The patient underwent bilateral diagnostic mammography with tomography and right breast ultrasonography at [facility] on 12/20/2017 showing: breast density category C. There was a suspicious palpable mass at the 1 o'clock position upper inner quadrant measuring 1.4 x 1.3 x 1.3 cm located 12 cm from the nipple. There was an additional mass at the 8 o'clock radiant measuring 1.2 x 1.0 x 1.4 cm, which likely represented a cyst. Ultrasound evaluation of the right axilla demonstrates no evidence of lymphadenopathy  Accordingly on 12/29/2017 she proceeded to biopsy of the right breast area in question. The pathology from this procedure showed ([id number]): Invasive ductal carcinoma, grade III. Prognostic indicators significant for: estrogen receptor, 80% positive and progesterone receptor, 70% positive, both with strong staining intensity. Proliferation marker Ki67 at 80%. HER2 amplified with ratios HER2/CEP17 signals 3.44 and average HER2 copies per cell 10.65.  We are obtaining [place] and estradiol levels today to assess her menopausal status  The patient's subsequent history is as detailed below.   PAST MEDICAL HISTORY: Past Medical History:  Diagnosis Date  . Arthritis    lower back  . Cancer [facility])    right breast cancer  . Family history of breast cancer   . Personal history of chemotherapy   . Personal history of radiation therapy   She notes that she had a heart murmur as a child, which she grew out of.    PAST SURGICAL HISTORY: Past Surgical History:  Procedure Laterality Date  . ABDOMINAL HYSTERECTOMY    . BREAST LUMPECTOMY Right   . BREAST LUMPECTOMY WITH RADIOACTIVE SEED  AND SENTINEL LYMPH NODE BIOPSY Right 02/16/2018   Procedure: RIGHT BREAST LUMPECTOMY WITH RADIOACTIVE SEED AND SENTINEL LYMPH NODE BIOPSY;  Surgeon: [name], MD;  Location: [facility];  Service: General;  Laterality: Right;  . HYMENECTOMY    . PORTACATH PLACEMENT Right  02/16/2018   Procedure: INSERTION PORT-A-CATH;  Surgeon: [name], MD;  Location: [facility];  Service: General;  Laterality: Right;  . RE-EXCISION OF BREAST LUMPECTOMY Right 02/22/2018   Procedure: RE-EXCISION OF RIGHT  BREAST LUMPECTOMY;  Surgeon: [name], MD;  Location: [facility];  Service: General;  Laterality: Right;  . REPAIR VAGINAL CUFF N/A 02/07/2017   Procedure: REPAIR VAGINAL CUFF;  Surgeon: Ena Dawley, MD;  Location: [facility];  Service: Gynecology;  Laterality: N/A;  . ROBOTIC ASSISTED TOTAL HYSTERECTOMY WITH SALPINGECTOMY Left 01/20/2017   Procedure: ROBOTIC ASSISTED TOTAL HYSTERECTOMY WITH SALPINGECTOMY;  Surgeon: [name], MD;  Location: [facility];  Service: Gynecology;  Laterality: Left;  Partial Hysterectomy without BSO   FAMILY HISTORY Family History  Problem Relation Age of Onset  . Lung cancer Maternal Grandfather   . Esophageal cancer Paternal Grandfather 80  . Breast cancer Cousin 25   As of July 2019, the patient's father is alive at 45. The patient's mother is also alive at 42. The patient has 1 brother and 5 sisters. There was a maternal grandfather diagnosed with lung cancer at 90. There was a paternal grandfather diagnosed with esophageal cancer at 27. The patient's father had a pre-cancerous esophageal finding at age 39. There was also a maternal 1st cousin diagnosed with metastatic breast cancer at age 7.    GYNECOLOGIC HISTORY:  Patient's last menstrual period was 12/21/2016 (exact date). Menarche: 49 years old Age at first live birth: 49 years old She is GXP2. She is status post partial hysterectomy without BSO in 2018 She never used HRT. She used oral contraception over 21 years ago with no complications.   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 The patient is separated from her husband, Monica [name]. The patient's son Monica [name] is  in the [employer] and is stationed in [place] (for 3 years beginning January 2020). He plans on working in the railroad industry after he returns. The patient's daughter Monica [name], age 78, works at [employer], maintenance    [place]: Not in place; at the 06/20/2020 visit the patient was given the appropriate documents to complete and notarized at her discretion   HEALTH MAINTENANCE: Social History   Tobacco Use  . Smoking status: Never Smoker  . Smokeless tobacco: Never Used  Vaping Use  . Vaping Use: Some days  Substance Use Topics  . Alcohol use: Yes    Comment: occ  . Drug use: No     Colonoscopy:   PAP: 2018/ prior to hysterectomy  Bone density:   No Known Allergies  Current Outpatient Medications  Medication Sig Dispense Refill  . Acetaminophen (TYLENOL PO) Take by mouth as needed. 2 tabs as needed for aches and pains     . cholecalciferol (VITAMIN D3) 25 MCG (1000 UNIT) tablet Take 1 tablet (1,000 Units total) by mouth daily.    Marland Kitchen ibuprofen (ADVIL,MOTRIN) 800 MG tablet Take 1 tablet (800 mg total) by mouth every 8 (eight) hours as needed. (Patient taking differently: Take 800 mg by mouth as needed. ) 30 tablet 0  . loratadine (CLARITIN) 10 MG tablet Take 10 mg by mouth daily  as needed.     Marland Kitchen NAPROXEN PO Take by mouth as needed. 1 tab as needed for aches and pain    . Probiotic Product (PROBIOTIC PO) Take by mouth daily.    . tamoxifen (NOLVADEX) 20 MG tablet Take 1 tablet (20 mg total) by mouth daily. 90 tablet 4   No current facility-administered medications for this visit.    OBJECTIVE: [name] in no acute distress  Vitals:   06/20/20 0859  BP: 120/82  Pulse: (!) 56  Resp: 18  Temp: 97.8 F (36.6 C)  SpO2: 100%     Body mass index is 28.67 kg/m.   Wt Readings from Last 3 Encounters:  06/20/20 181 lb 11.2 oz (82.4 kg)  06/20/19 170 lb 6.4 oz (77.3 kg)  03/28/19 172 lb 11.2 oz (78.3 kg)  ECOG FS:1  Sclerae unicteric, EOMs intact Wearing a mask No cervical  or supraclavicular adenopathy Lungs no rales or rhonchi Heart regular rate and rhythm Abd soft, nontender, positive bowel sounds MSK no focal spinal tenderness, no upper extremity lymphedema Neuro: nonfocal, well oriented, appropriate affect Breasts: The right breast is status post lumpectomy and radiation.  One of the "spots" that she is concerned about is a slightly blocked skin gland.  The other one looks like a small keloid in the lateral aspect of the right breast.  The left breast and both axillae are benign   LAB RESULTS:  CMP     Component Value Date/Time   NA 140 06/13/2019 1138   K 3.8 06/13/2019 1138   CL 102 06/13/2019 1138   CO2 28 06/13/2019 1138   GLUCOSE 101 (H) 06/13/2019 1138   BUN 10 06/13/2019 1138   CREATININE 0.80 06/13/2019 1138   CALCIUM 9.4 06/13/2019 1138   PROT 7.2 06/13/2019 1138   ALBUMIN 4.1 06/13/2019 1138   AST 21 06/13/2019 1138   ALT 19 06/13/2019 1138   ALKPHOS 38 06/13/2019 1138   BILITOT 0.6 06/13/2019 1138   GFRNONAA >60 06/13/2019 1138   GFRAA >60 06/13/2019 1138    Lab Results  Component Value Date   WBC 6.3 06/20/2020   NEUTROABS 4.3 06/20/2020   HGB 13.0 06/20/2020   HCT 40.5 06/20/2020   MCV 94.0 06/20/2020   PLT 230 06/20/2020    No results found for: LABCA2  No components found for: HENIDP824  No results for input(s): INR in the last 168 hours.  No results found for: LABCA2  No results found for: MPN361  No results found for: WER154  No results found for: MGQ676  No results found for: CA2729  No components found for: HGQUANT  No results found for: CEA1 / No results found for: CEA1   No results found for: AFPTUMOR  No results found for: CHROMOGRNA  No results found for: PSA1  Appointment on 06/20/2020  Component Date Value Ref Range Status  . WBC Count 06/20/2020 6.3  4.0 - 10.5 K/uL Final  . RBC 06/20/2020 4.31  3.87 - 5.11 MIL/uL Final  . Hemoglobin 06/20/2020 13.0  12.0 - 15.0 g/dL Final  . HCT  06/20/2020 40.5  36.0 - 46.0 % Final  . MCV 06/20/2020 94.0  80.0 - 100.0 fL Final  . MCH 06/20/2020 30.2  26.0 - 34.0 pg Final  . MCHC 06/20/2020 32.1  30.0 - 36.0 g/dL Final  . RDW 06/20/2020 11.9  11.5 - 15.5 % Final  . Platelet Count 06/20/2020 230  150 - 400 K/uL Final  . nRBC 06/20/2020 0.0  0.0 - 0.2 % Final  . Neutrophils Relative % 06/20/2020 67  % Final  . Neutro Abs 06/20/2020 4.3  1.7 - 7.7 K/uL Final  . Lymphocytes Relative 06/20/2020 20  % Final  . Lymphs Abs 06/20/2020 1.3  0.7 - 4.0 K/uL Final  . Monocytes Relative 06/20/2020 9  % Final  . Monocytes Absolute 06/20/2020 0.6  0.1 - 1.0 K/uL Final  . Eosinophils Relative 06/20/2020 2  % Final  . Eosinophils Absolute 06/20/2020 0.1  0.0 - 0.5 K/uL Final  . Basophils Relative 06/20/2020 1  % Final  . Basophils Absolute 06/20/2020 0.1  0.0 - 0.1 K/uL Final  . Immature Granulocytes 06/20/2020 1  % Final  . Abs Immature Granulocytes 06/20/2020 0.03  0.00 - 0.07 K/uL Final   Performed at [facility], [place] [address] [place], [place] [address]    (this displays the last labs from the last 3 days)  No results found for: TOTALPROTELP, ALBUMINELP, A1GS, A2GS, BETS, BETA2SER, GAMS, MSPIKE, SPEI (this displays SPEP labs)  No results found for: KPAFRELGTCHN, LAMBDASER, KAPLAMBRATIO (kappa/lambda light chains)  No results found for: HGBA, HGBA2QUANT, HGBFQUANT, HGBSQUAN (Hemoglobinopathy evaluation)   No results found for: LDH  No results found for: IRON, TIBC, IRONPCTSAT (Iron and TIBC)  No results found for: FERRITIN  Urinalysis No results found for: COLORURINE, APPEARANCEUR, LABSPEC, PHURINE, GLUCOSEU, HGBUR, BILIRUBINUR, KETONESUR, PROTEINUR, UROBILINOGEN, NITRITE, LEUKOCYTESUR   STUDIES: No results found.    ELIGIBLE FOR AVAILABLE RESEARCH PROTOCOL: BCEP  ASSESSMENT: 49 y.o.  Monica [name], Monica [name] status post right breast upper inner quadrant biopsy 12/29/2017, for a clinical T1c N0, stage  IA invasive ductal carcinoma, triple positive, with an MIB-1 of 80%.  (1) genetics testing 02/02/2018 though the CancerNext gene panel offered by [facility] showed no deleterious mutations in  APC, ATM, BARD1, BMPR1A,BRCA1, BRCA2, BRIP1, CDH1, CDK4, CDKN2A, CHEK2, DICER1, HOXB13, MLH1, MRE11A, MSH2, MSH6, MUTYH, NBN, NF1, PALB2, PMS2, POLD1, POLE, PTEN, RAD50, RAD51C, RAD51D, SMAD4, SMARCA4, STK11 and TP53 (sequencing and deletion/duplication); EPCAM and GREM1 (deletion/duplication only).  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>A) )   (2) right lumpectomy and sentinel lymph node sampling 02/16/2018 showed a pT2 pN0, stage IB invasive ductal carcinoma, grade 3, with a positive inferior margin; a total of 5 lymph nodes were removed  (a) additional surgery 02/27/2018 cleared the margins  (3) started carboplatin, docetaxel, trastuzumab and pertuzumab 03/11/2018, repeated every 21 days x 6, last dose 07/18/2018.    (a) Pertuzumab omitted after cycle 1 due to diarrhea.  (b) Gemcitabine substituted for docetaxel starting with cycle 5 due to peripheral neuropathy  (4) continued trastuzumab to total 6 months (through February 2020).  (a) echocardiogram 01/21/2018 showed an ejection fraction in the 55-60% range  (b) repeat echocardiogram 06/14/2018 showed an ejection fraction in the 60-65%  (5) adjuvant radiation 08/15/2018 - 09/28/2018 Site/dose:   The patient initially received a dose of 50.4 Gy in 28 fractions to the right breast using whole-breast tangent fields. This was delivered using a 3-D conformal technique. The patient then received a boost to the seroma. This delivered an additional 10 Gy in 5 fractions using 12E, 9E electrons with a special teletherapy technique. The total dose was 60.4 Gy.   (6) started tamoxifen March 2020  (a) the patient is status post hysterectomy, wihtout bilateral salpingo-oophorectomy  (b) [place] and estradiol levels June 2020 show she  is pre menopausaul  (c) will recheck 06/2020  PLAN: Calisha is now a little over 2 years out from definitive surgery for breast cancer with no evidence of disease recurrence.  This is very favorable.  She is tolerating tamoxifen well and the plan is to continue that a total of 5 years.  She will have her next mammogram in July.  She will see me in August.  Before that visit we will repeat an [place] estradiol and vitamin D level a couple of days before the visit so we will have the results at that time  She knows to call for any other issue admittable before then  Total encounter time 25 minutes.Sarajane Jews C. Arvine Clayburn, MD  06/20/20 9:18 AM Medical Oncology and Hematology [place], [place] [address] Tel. [phone]    Fax. [phone]   I, Wilburn Mylar, am acting as scribe for Dr. [name]. Donold Marotto.  I, Lurline Del MD, have reviewed the above documentation for accuracy and completeness, and I agree with the above.    *Total Encounter Time as defined by the Centers for Medicare and Medicaid Services includes, in addition to the face-to-face time of a patient visit (documented in the note above) non-face-to-face time: obtaining and reviewing outside history, ordering and reviewing medications, tests or procedures, care coordination (communications with other health care professionals or caregivers) and documentation in the medical record.

## 2020-06-20 ENCOUNTER — Other Ambulatory Visit: Payer: Self-pay

## 2020-06-20 ENCOUNTER — Ambulatory Visit: Payer: Managed Care, Other (non HMO)

## 2020-06-20 ENCOUNTER — Inpatient Hospital Stay: Payer: No Typology Code available for payment source | Attending: Oncology | Admitting: Oncology

## 2020-06-20 ENCOUNTER — Inpatient Hospital Stay: Payer: No Typology Code available for payment source

## 2020-06-20 VITALS — BP 120/82 | HR 56 | Temp 97.8°F | Resp 18 | Ht 66.75 in | Wt 181.7 lb

## 2020-06-20 DIAGNOSIS — Z803 Family history of malignant neoplasm of breast: Secondary | ICD-10-CM | POA: Insufficient documentation

## 2020-06-20 DIAGNOSIS — C50211 Malignant neoplasm of upper-inner quadrant of right female breast: Secondary | ICD-10-CM

## 2020-06-20 DIAGNOSIS — Z90710 Acquired absence of both cervix and uterus: Secondary | ICD-10-CM | POA: Diagnosis not present

## 2020-06-20 DIAGNOSIS — Z801 Family history of malignant neoplasm of trachea, bronchus and lung: Secondary | ICD-10-CM | POA: Insufficient documentation

## 2020-06-20 DIAGNOSIS — Z171 Estrogen receptor negative status [ER-]: Secondary | ICD-10-CM | POA: Diagnosis not present

## 2020-06-20 DIAGNOSIS — Z923 Personal history of irradiation: Secondary | ICD-10-CM | POA: Diagnosis not present

## 2020-06-20 DIAGNOSIS — Z79810 Long term (current) use of selective estrogen receptor modulators (SERMs): Secondary | ICD-10-CM | POA: Diagnosis not present

## 2020-06-20 DIAGNOSIS — Z9221 Personal history of antineoplastic chemotherapy: Secondary | ICD-10-CM | POA: Insufficient documentation

## 2020-06-20 DIAGNOSIS — Z170 Estrogen receptor positive status [ER+]: Secondary | ICD-10-CM

## 2020-06-20 DIAGNOSIS — Z800 Family history of malignant neoplasm of digestive organs: Secondary | ICD-10-CM | POA: Diagnosis not present

## 2020-06-20 LAB — CBC WITH DIFFERENTIAL (CANCER CENTER ONLY)
Abs Immature Granulocytes: 0.03 10*3/uL (ref 0.00–0.07)
Basophils Absolute: 0.1 10*3/uL (ref 0.0–0.1)
Basophils Relative: 1 %
Eosinophils Absolute: 0.1 10*3/uL (ref 0.0–0.5)
Eosinophils Relative: 2 %
HCT: 40.5 % (ref 36.0–46.0)
Hemoglobin: 13 g/dL (ref 12.0–15.0)
Immature Granulocytes: 1 %
Lymphocytes Relative: 20 %
Lymphs Abs: 1.3 10*3/uL (ref 0.7–4.0)
MCH: 30.2 pg (ref 26.0–34.0)
MCHC: 32.1 g/dL (ref 30.0–36.0)
MCV: 94 fL (ref 80.0–100.0)
Monocytes Absolute: 0.6 10*3/uL (ref 0.1–1.0)
Monocytes Relative: 9 %
Neutro Abs: 4.3 10*3/uL (ref 1.7–7.7)
Neutrophils Relative %: 67 %
Platelet Count: 230 10*3/uL (ref 150–400)
RBC: 4.31 MIL/uL (ref 3.87–5.11)
RDW: 11.9 % (ref 11.5–15.5)
WBC Count: 6.3 10*3/uL (ref 4.0–10.5)
nRBC: 0 % (ref 0.0–0.2)

## 2020-06-20 LAB — CMP (CANCER CENTER ONLY)
ALT: 14 U/L (ref 0–44)
AST: 19 U/L (ref 15–41)
Albumin: 3.9 g/dL (ref 3.5–5.0)
Alkaline Phosphatase: 40 U/L (ref 38–126)
Anion gap: 9 (ref 5–15)
BUN: 11 mg/dL (ref 6–20)
CO2: 26 mmol/L (ref 22–32)
Calcium: 9.5 mg/dL (ref 8.9–10.3)
Chloride: 104 mmol/L (ref 98–111)
Creatinine: 0.81 mg/dL (ref 0.44–1.00)
GFR, Estimated: >60 mL/min (ref >60)
Glucose, Bld: 95 mg/dL (ref 70–99)
Potassium: 4 mmol/L (ref 3.5–5.1)
Sodium: 139 mmol/L (ref 135–145)
Total Bilirubin: 0.5 mg/dL (ref 0.3–1.2)
Total Protein: 7.1 g/dL (ref 6.5–8.1)

## 2020-06-20 MED ORDER — TAMOXIFEN CITRATE 20 MG PO TABS: 20.0000 mg | ORAL_TABLET | Freq: Every day | ORAL | 4 refills | Status: DC

## 2020-06-21 ENCOUNTER — Telehealth: Payer: Self-pay | Admitting: Oncology

## 2020-06-21 LAB — FOLLICLE STIMULATING HORMONE: FSH: 28.6 m[IU]/mL

## 2020-06-21 NOTE — Telephone Encounter (Signed)
Scheduled appts per 12/9 los. Left voicemail with appt dates and times.  

## 2020-06-26 LAB — ESTRADIOL, ULTRA SENS: Estradiol, Sensitive: 385.3 pg/mL

## 2020-07-13 HISTORY — PX: TOTAL HIP ARTHROPLASTY: SHX124

## 2020-09-13 ENCOUNTER — Ambulatory Visit
Admission: RE | Admit: 2020-09-13 | Discharge: 2020-09-13 | Disposition: A | Discharge status: Home / Self Care | Payer: No Typology Code available for payment source | Source: Ambulatory Visit | Attending: Chiropractic Medicine | Admitting: Chiropractic Medicine

## 2020-09-13 ENCOUNTER — Other Ambulatory Visit: Payer: Self-pay | Admitting: Chiropractic Medicine

## 2020-09-13 DIAGNOSIS — M542 Cervicalgia: Secondary | ICD-10-CM

## 2020-09-13 DIAGNOSIS — G8929 Other chronic pain: Secondary | ICD-10-CM

## 2020-09-13 DIAGNOSIS — R519 Headache, unspecified: Secondary | ICD-10-CM

## 2020-09-13 IMAGING — CR DG THORACIC SPINE 3V
3 series · 3 of 3 positions shown · non-contrast
Comparison: None.

CLINICAL DATA: Chronic back pain

EXAM:
THORACIC SPINE - 3 VIEWS

[w thoracic spine ap]
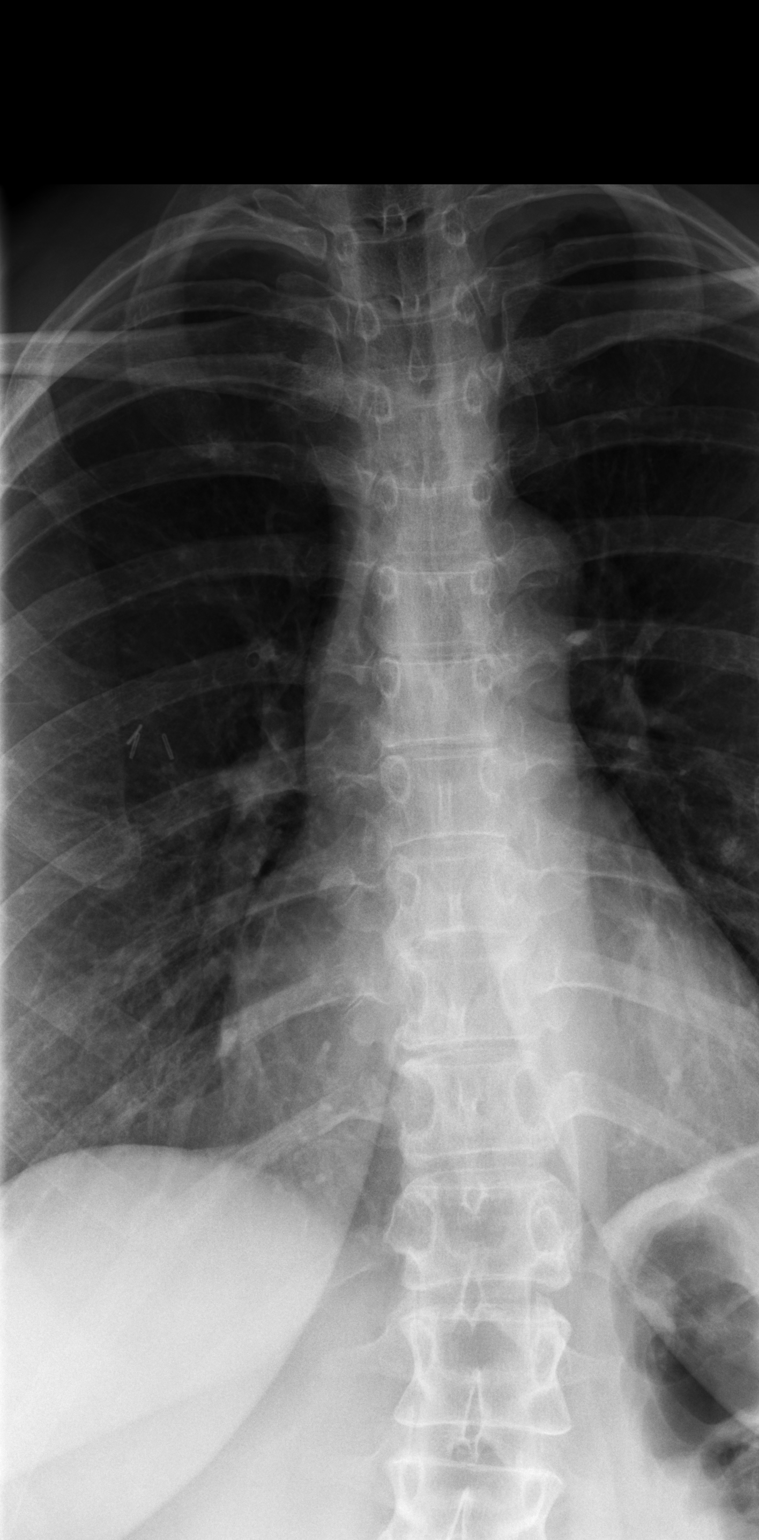

[w thoracic spine lat]
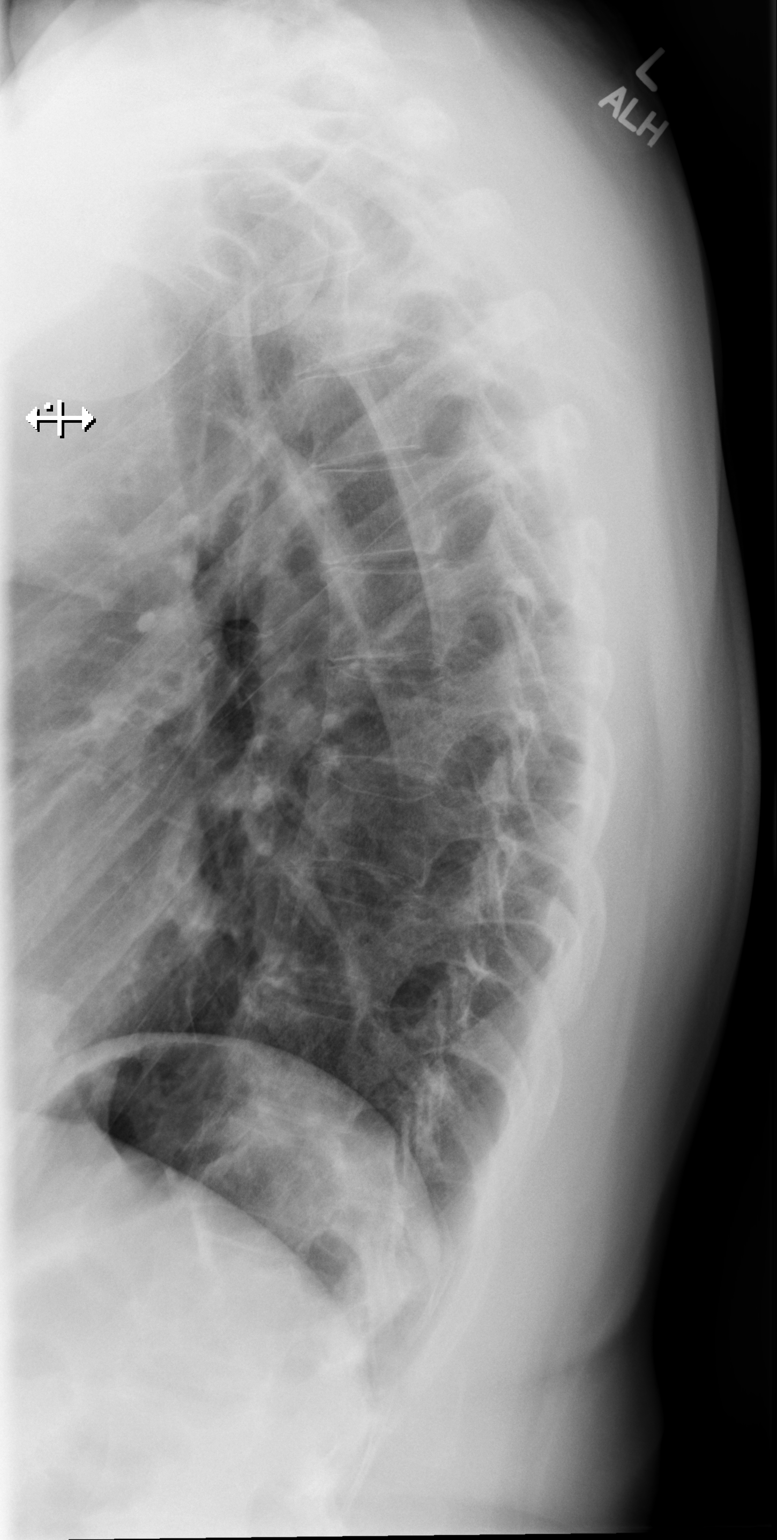

[w thoracic swimmers]
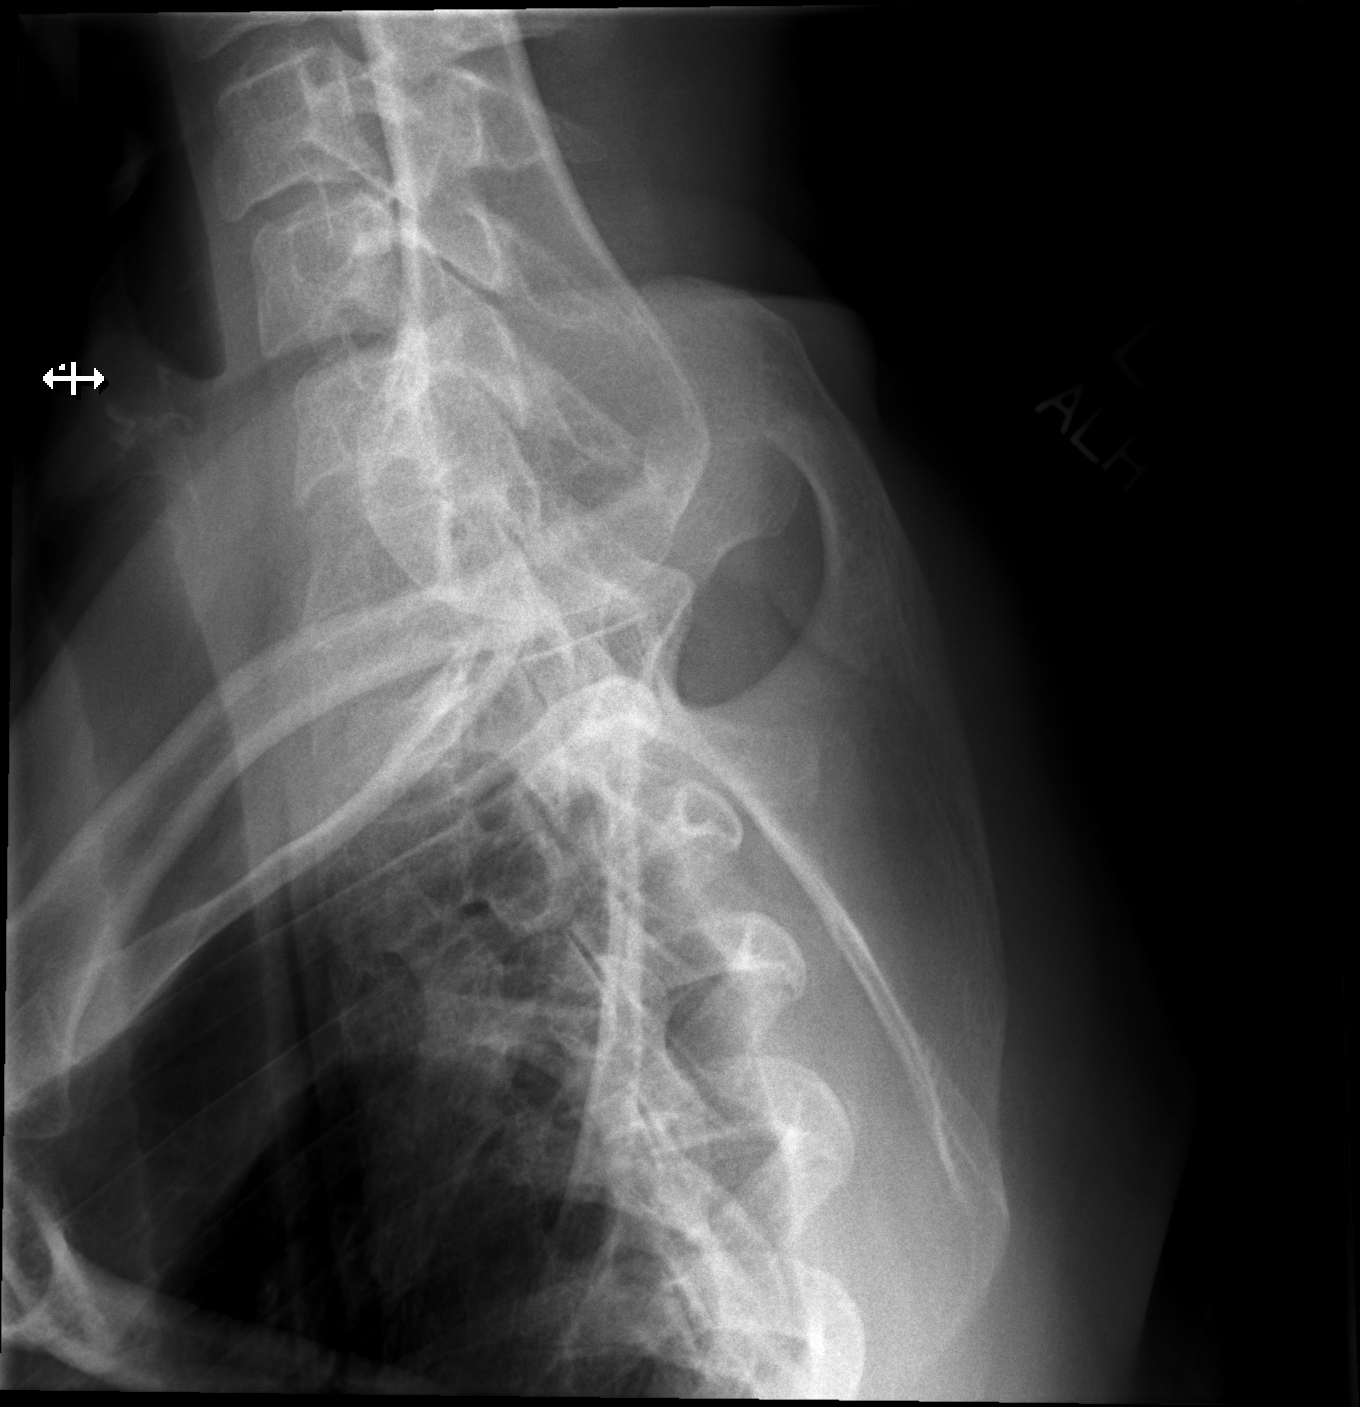

[3 of 3 positions shown; findings below may reference images not displayed]

FINDINGS: There is no evidence of thoracic spine fracture. Alignment is
normal. No other significant bone abnormalities are identified.
IMPRESSION: Negative.

## 2020-09-13 IMAGING — CR DG LUMBAR SPINE COMPLETE 4+V
5 series · 5 of 5 positions shown · non-contrast
Comparison: None.

CLINICAL DATA: Chronic back pain

EXAM:
LUMBAR SPINE - COMPLETE 4+ VIEW

[w lumbar spine ap]
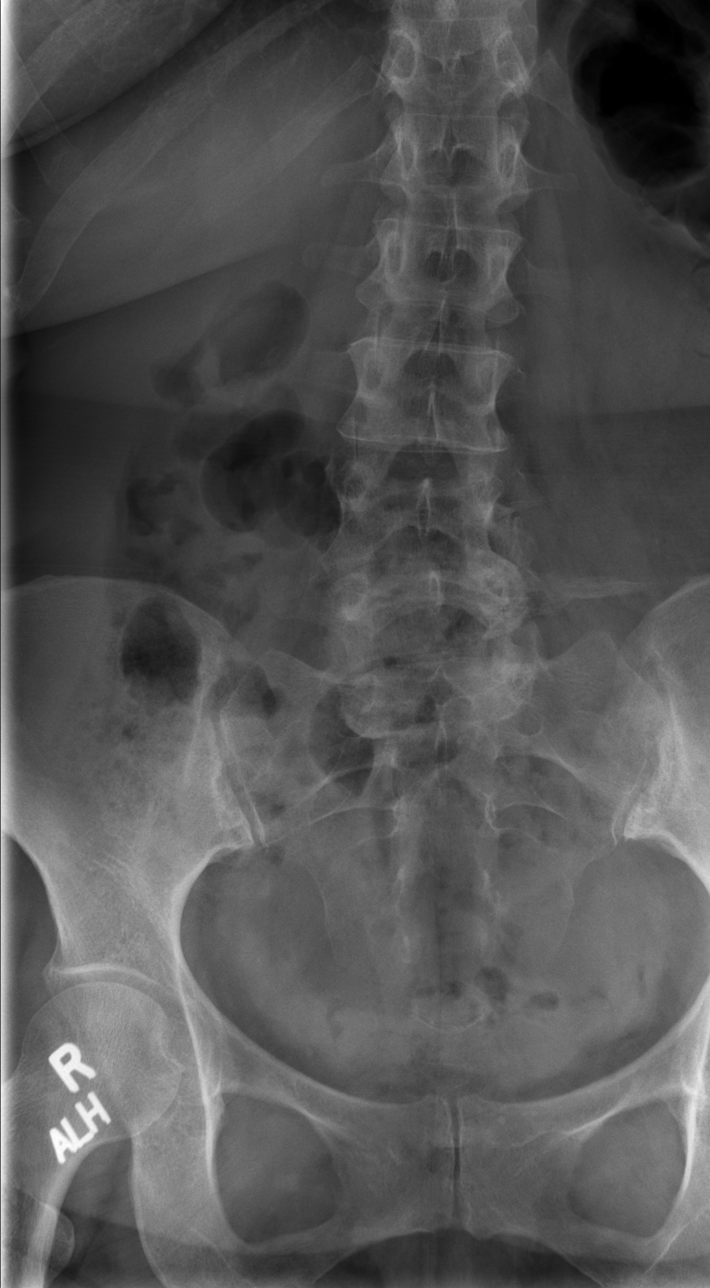

[w lumbar spine obl (1 of 2)]
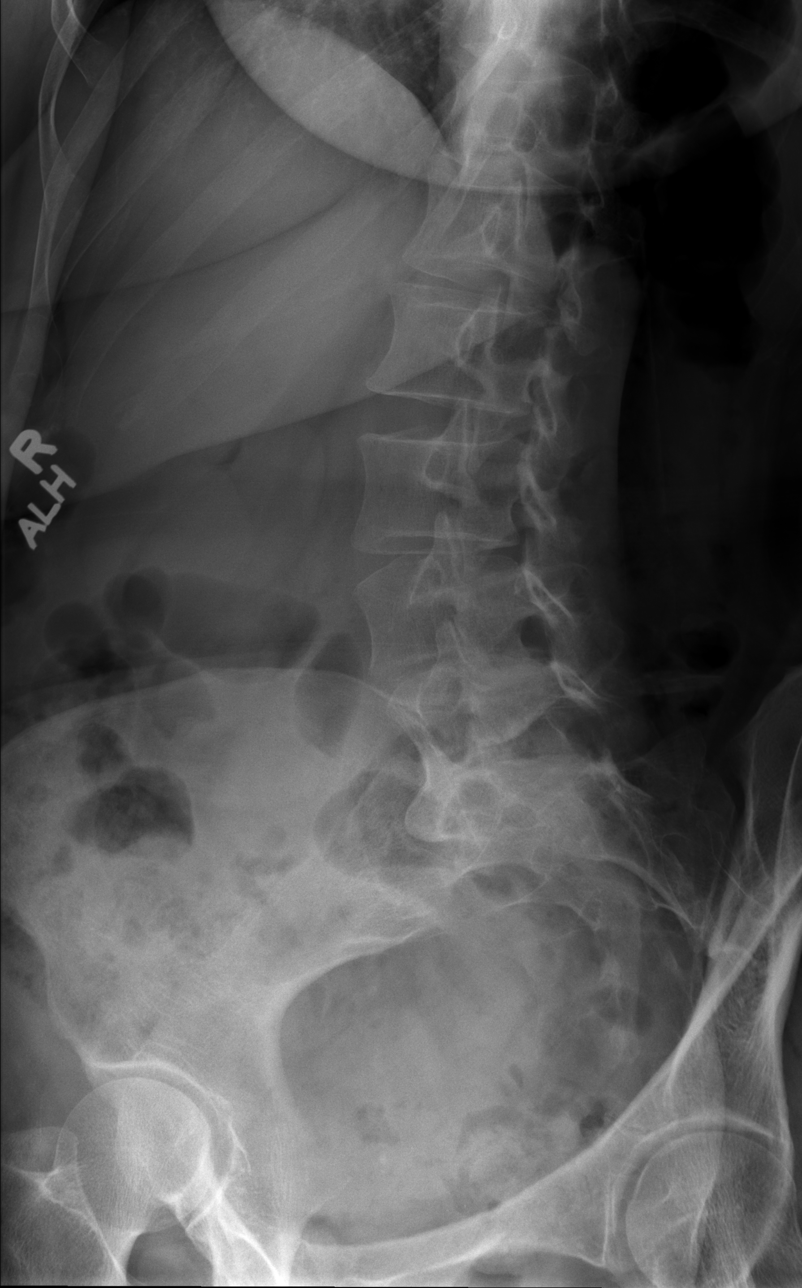

[w lumbar spine obl (2 of 2)]
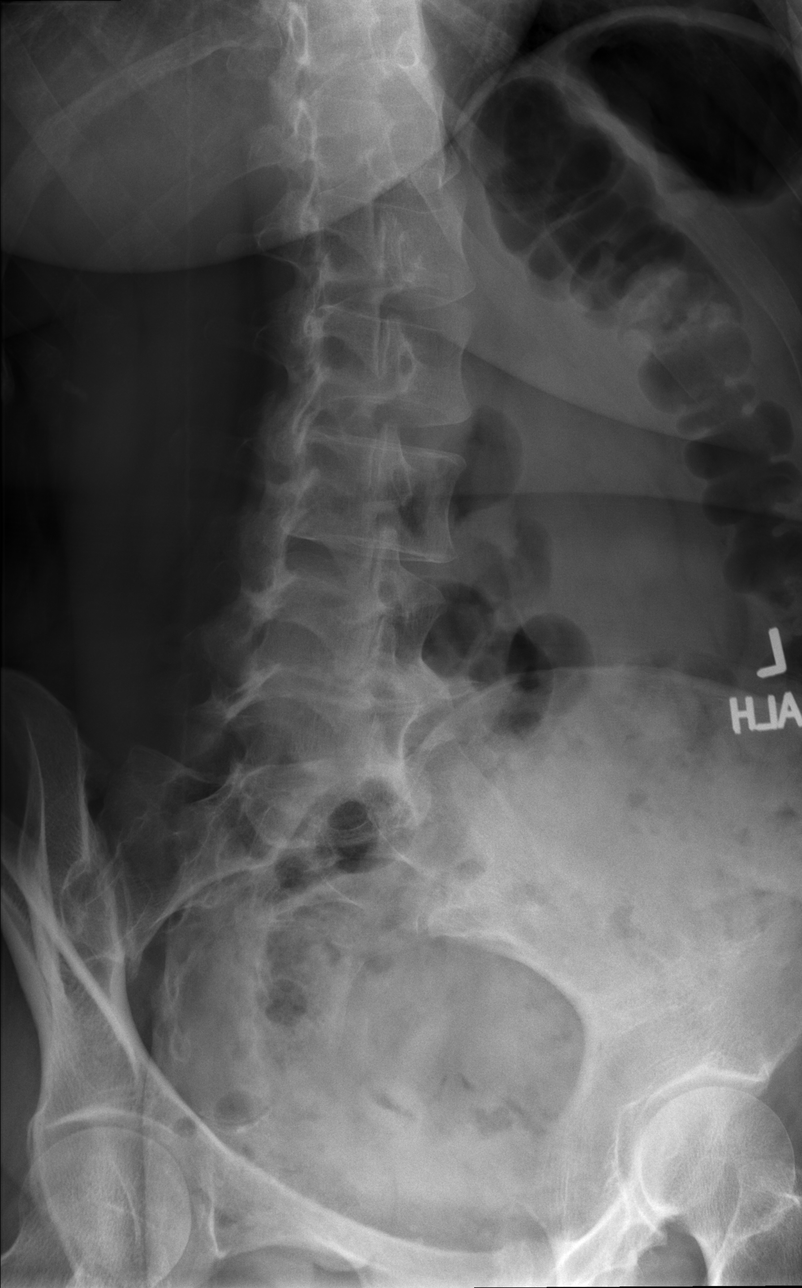

[w lumbar spine lat]
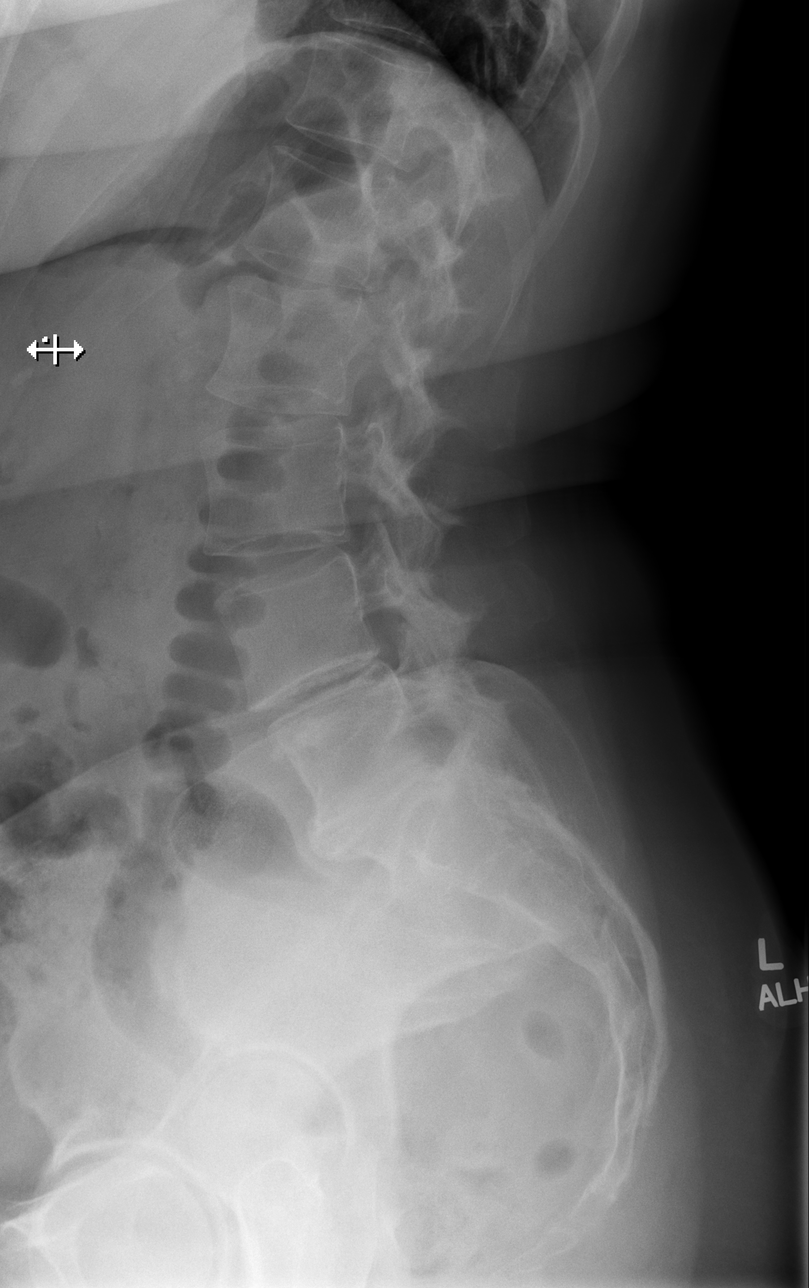

[w lumbar l-5 s-1 spot]
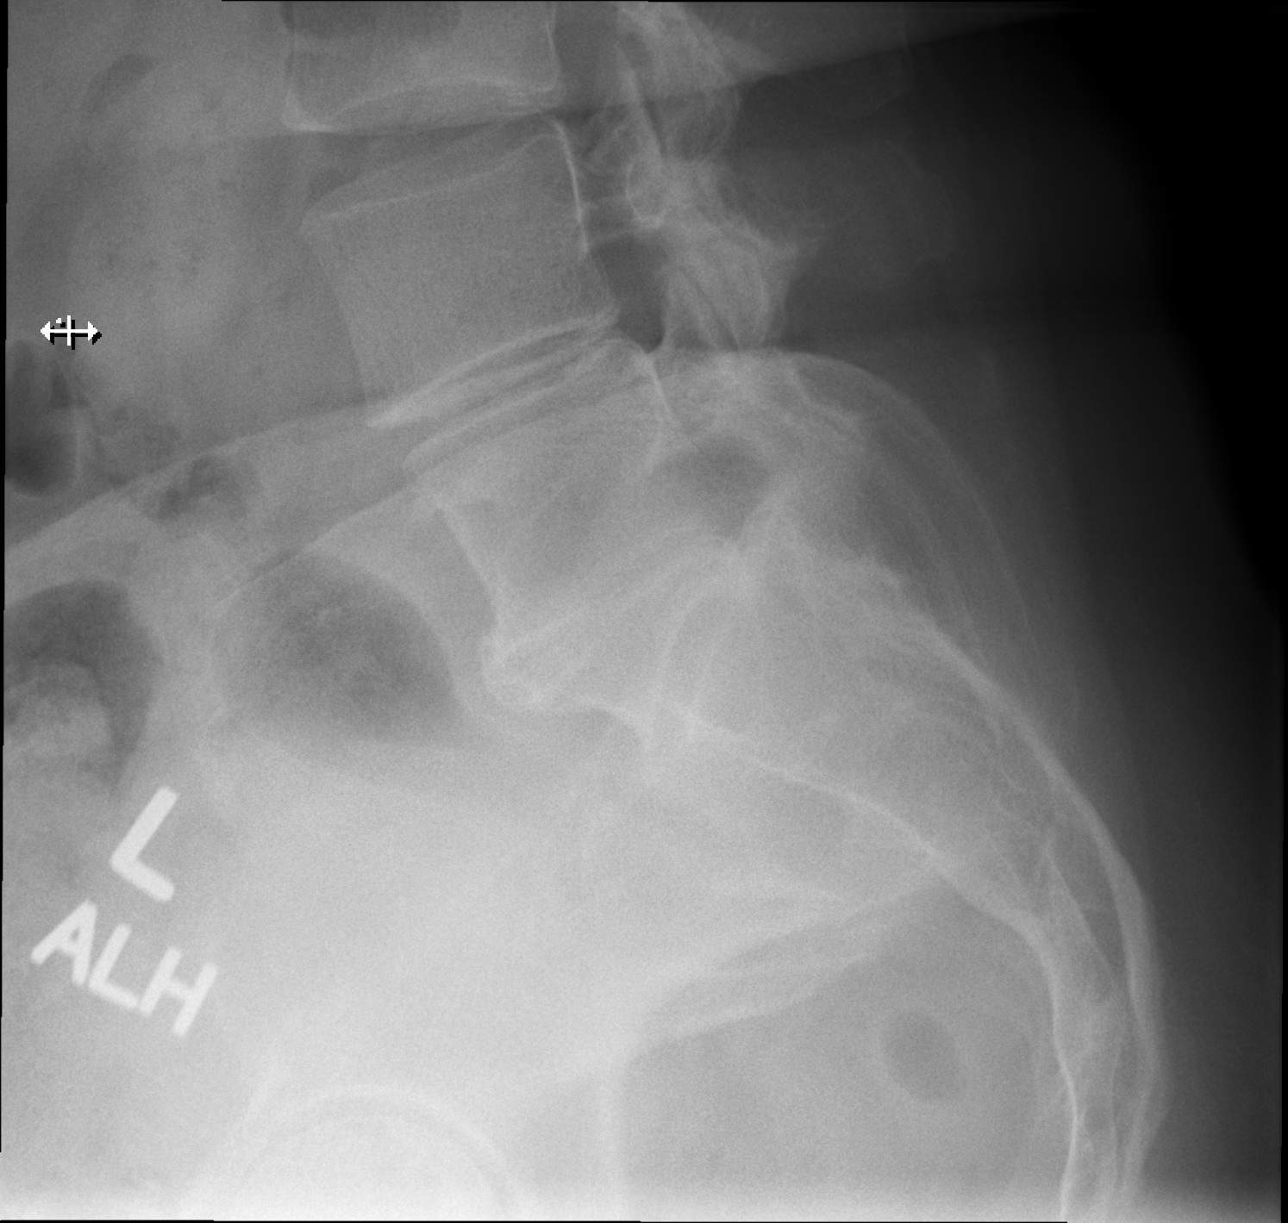

[5 of 5 positions shown; findings below may reference images not displayed]

FINDINGS: Normal lumbar lordosis. Mild lumbar dextrocurvature, apex right at
L4. There is no acute fracture or listhesis of the lumbar spine.
Vertebral body height is preserved. There is intervertebral disc
space narrowing and endplate remodeling at L4-5 and L5-S1 in keeping
with changes of moderate degenerative disc disease. Oblique views
demonstrate no evidence of pars defect. The paraspinal soft tissues
are unremarkable.
IMPRESSION: Moderate degenerative disc disease L4-S1.

## 2020-09-13 IMAGING — CR DG CERVICAL SPINE COMPLETE 4+V
5 series · 5 of 5 positions shown · non-contrast
Comparison: None.

CLINICAL DATA: Chronic neck pain

EXAM:
CERVICAL SPINE - COMPLETE 4+ VIEW

[w cervical spine lat]
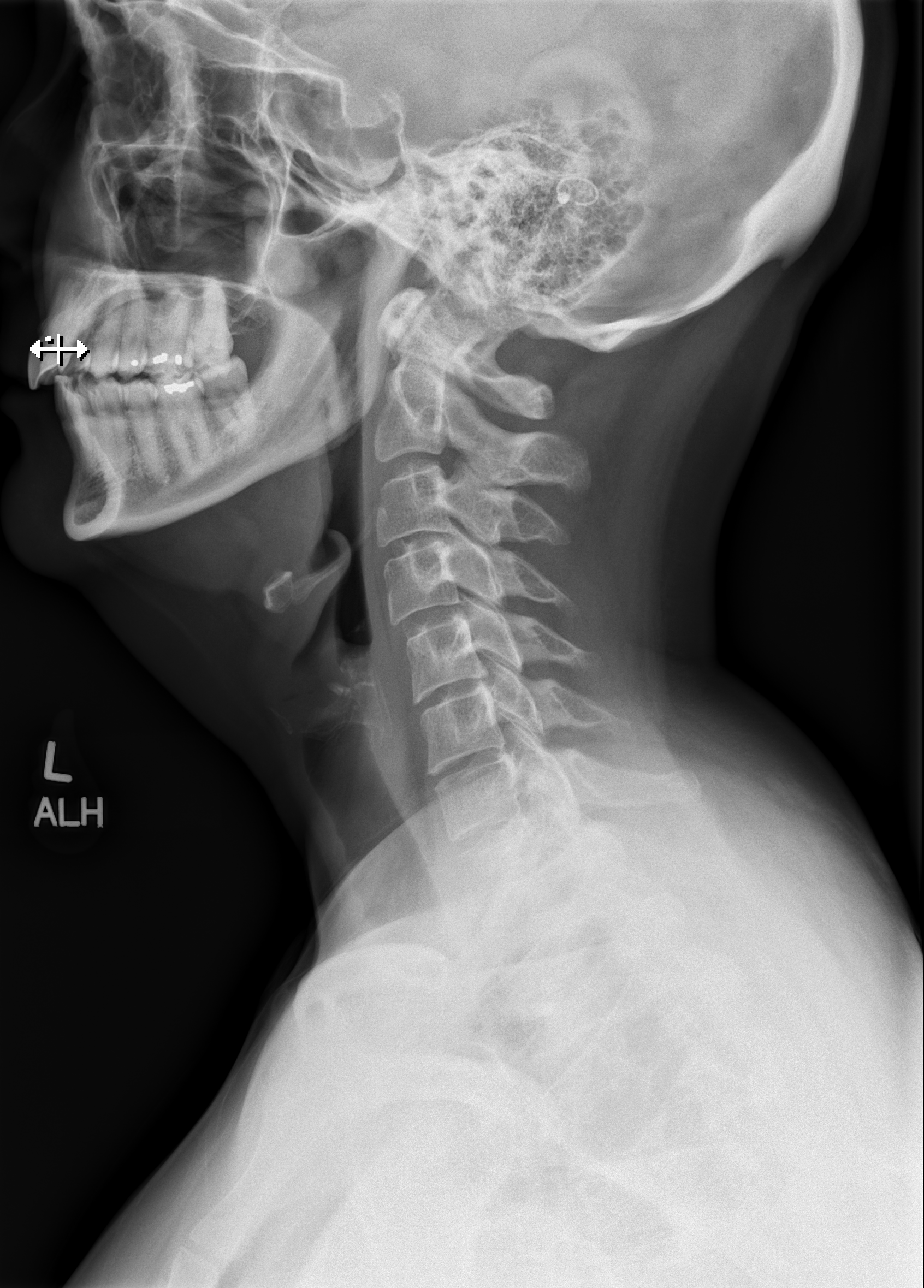

[w cervical spine ap_obl (1 of 2)]
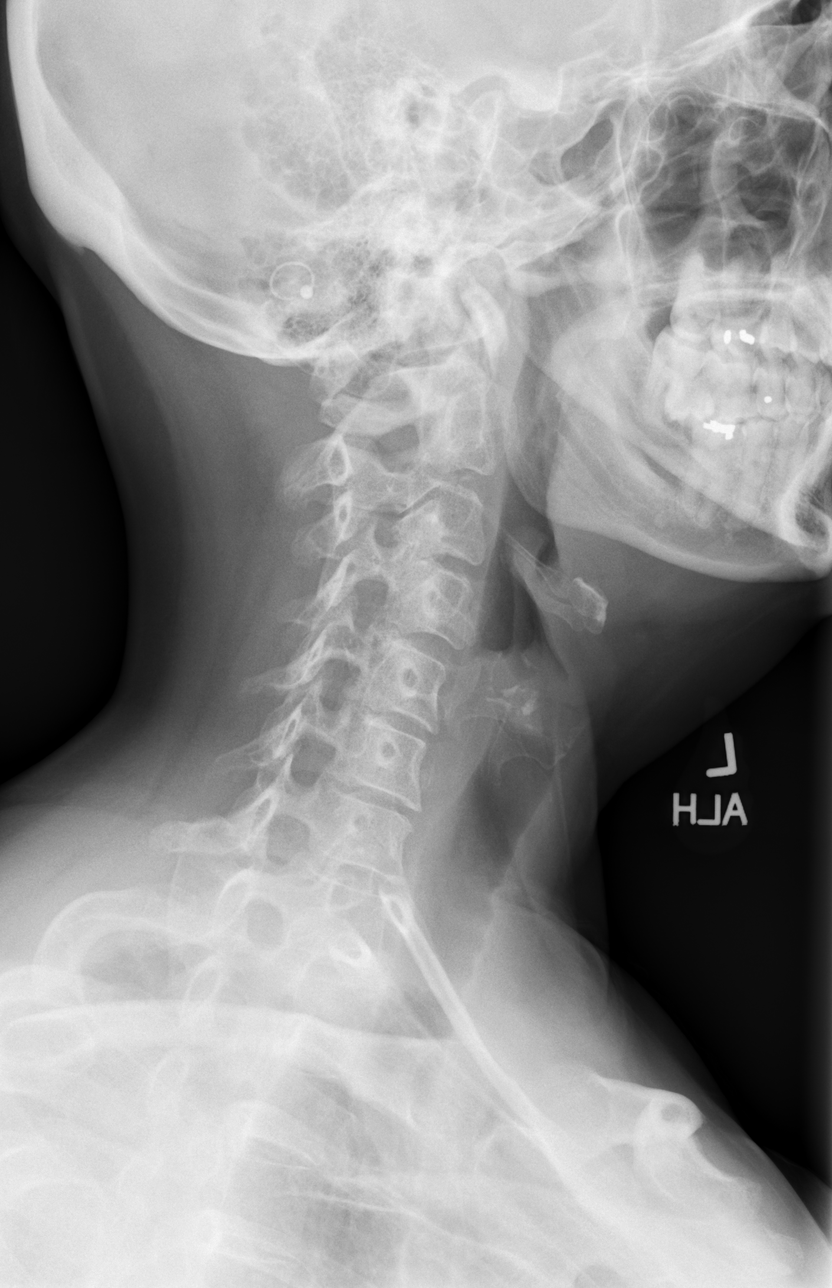

[w cervical spine ap_obl (2 of 2)]
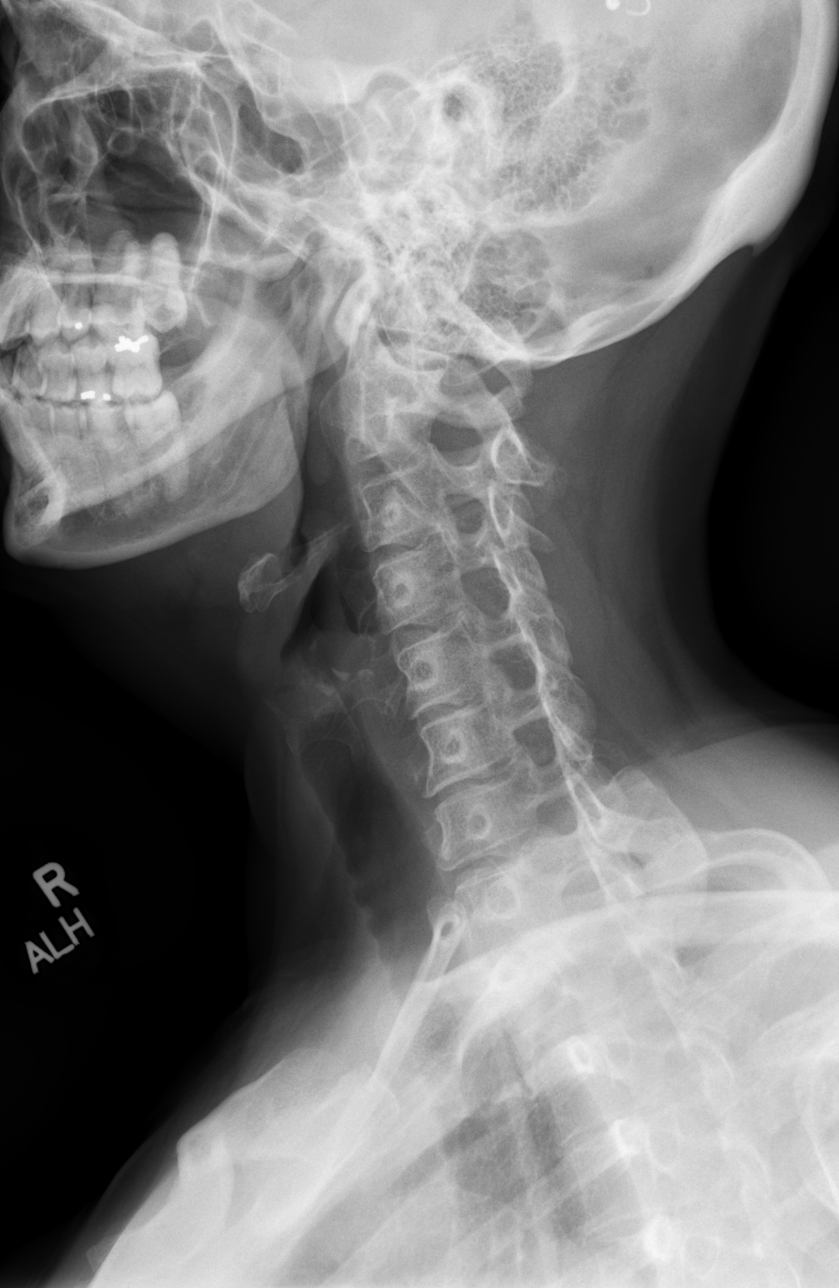

[w cervical spine ap]
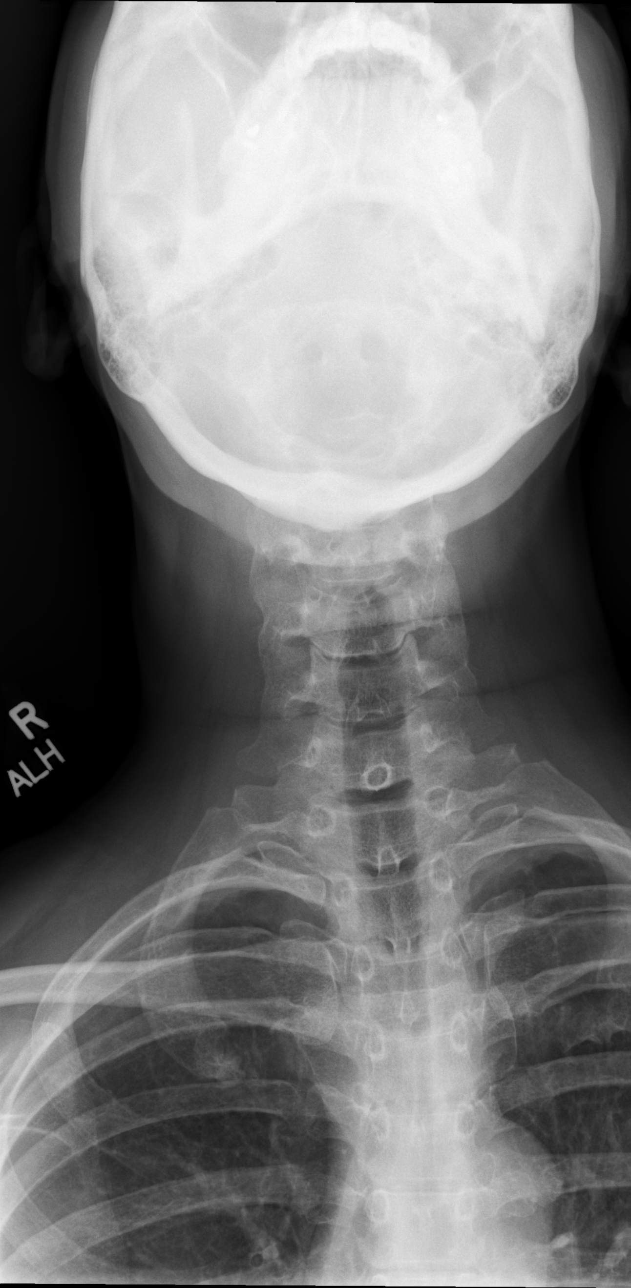

[w cervical spine odontoid]
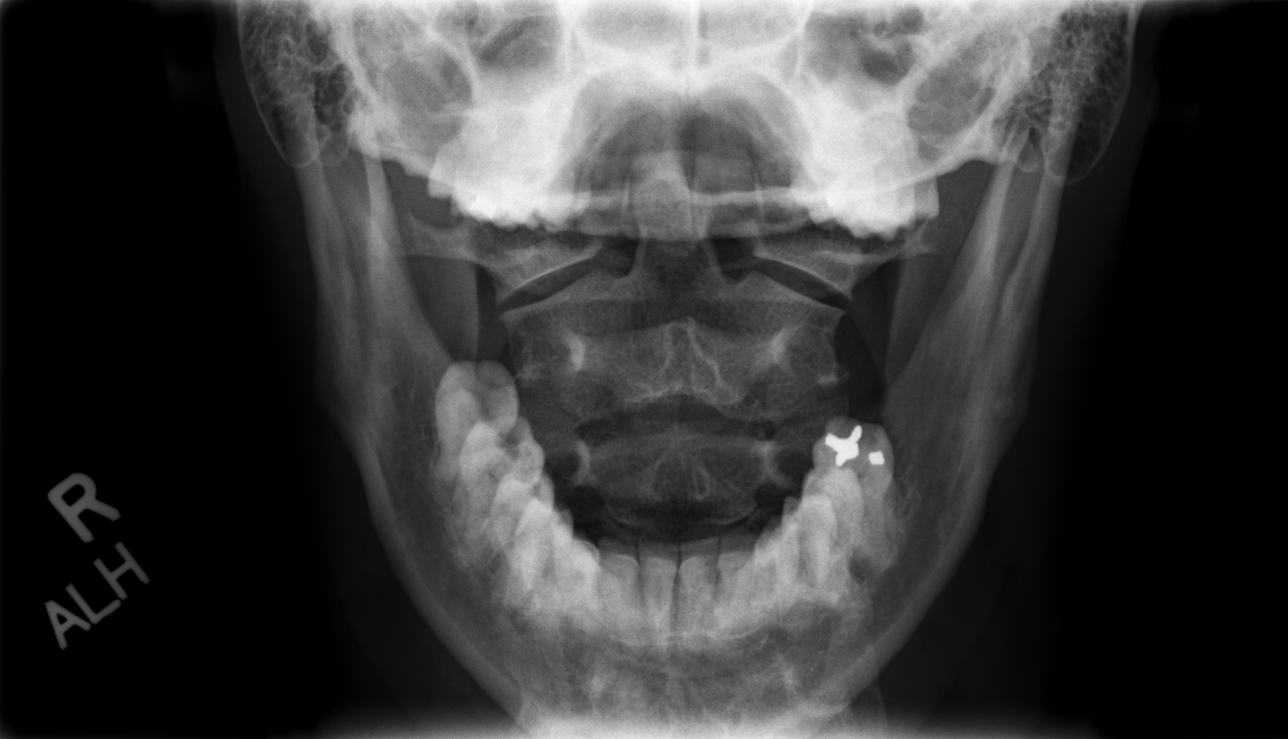

[5 of 5 positions shown; findings below may reference images not displayed]

FINDINGS: There is normal cervical lordosis. No acute fracture or listhesis of
the cervical spine. Vertebral body height and intervertebral disc
heights are preserved. The prevertebral soft tissues are not
thickened. The spinal canal is widely patent. Oblique views
demonstrates wide patency of the neural foramen bilaterally.
IMPRESSION: Negative cervical spine radiographs.

## 2020-09-13 IMAGING — CR DG PELVIS 1-2V
1 series · 1 of 1 positions shown · non-contrast
Comparison: None.

CLINICAL DATA: Pelvic pain

EXAM:
PELVIS - 1-2 VIEW

[w pelvis upright]
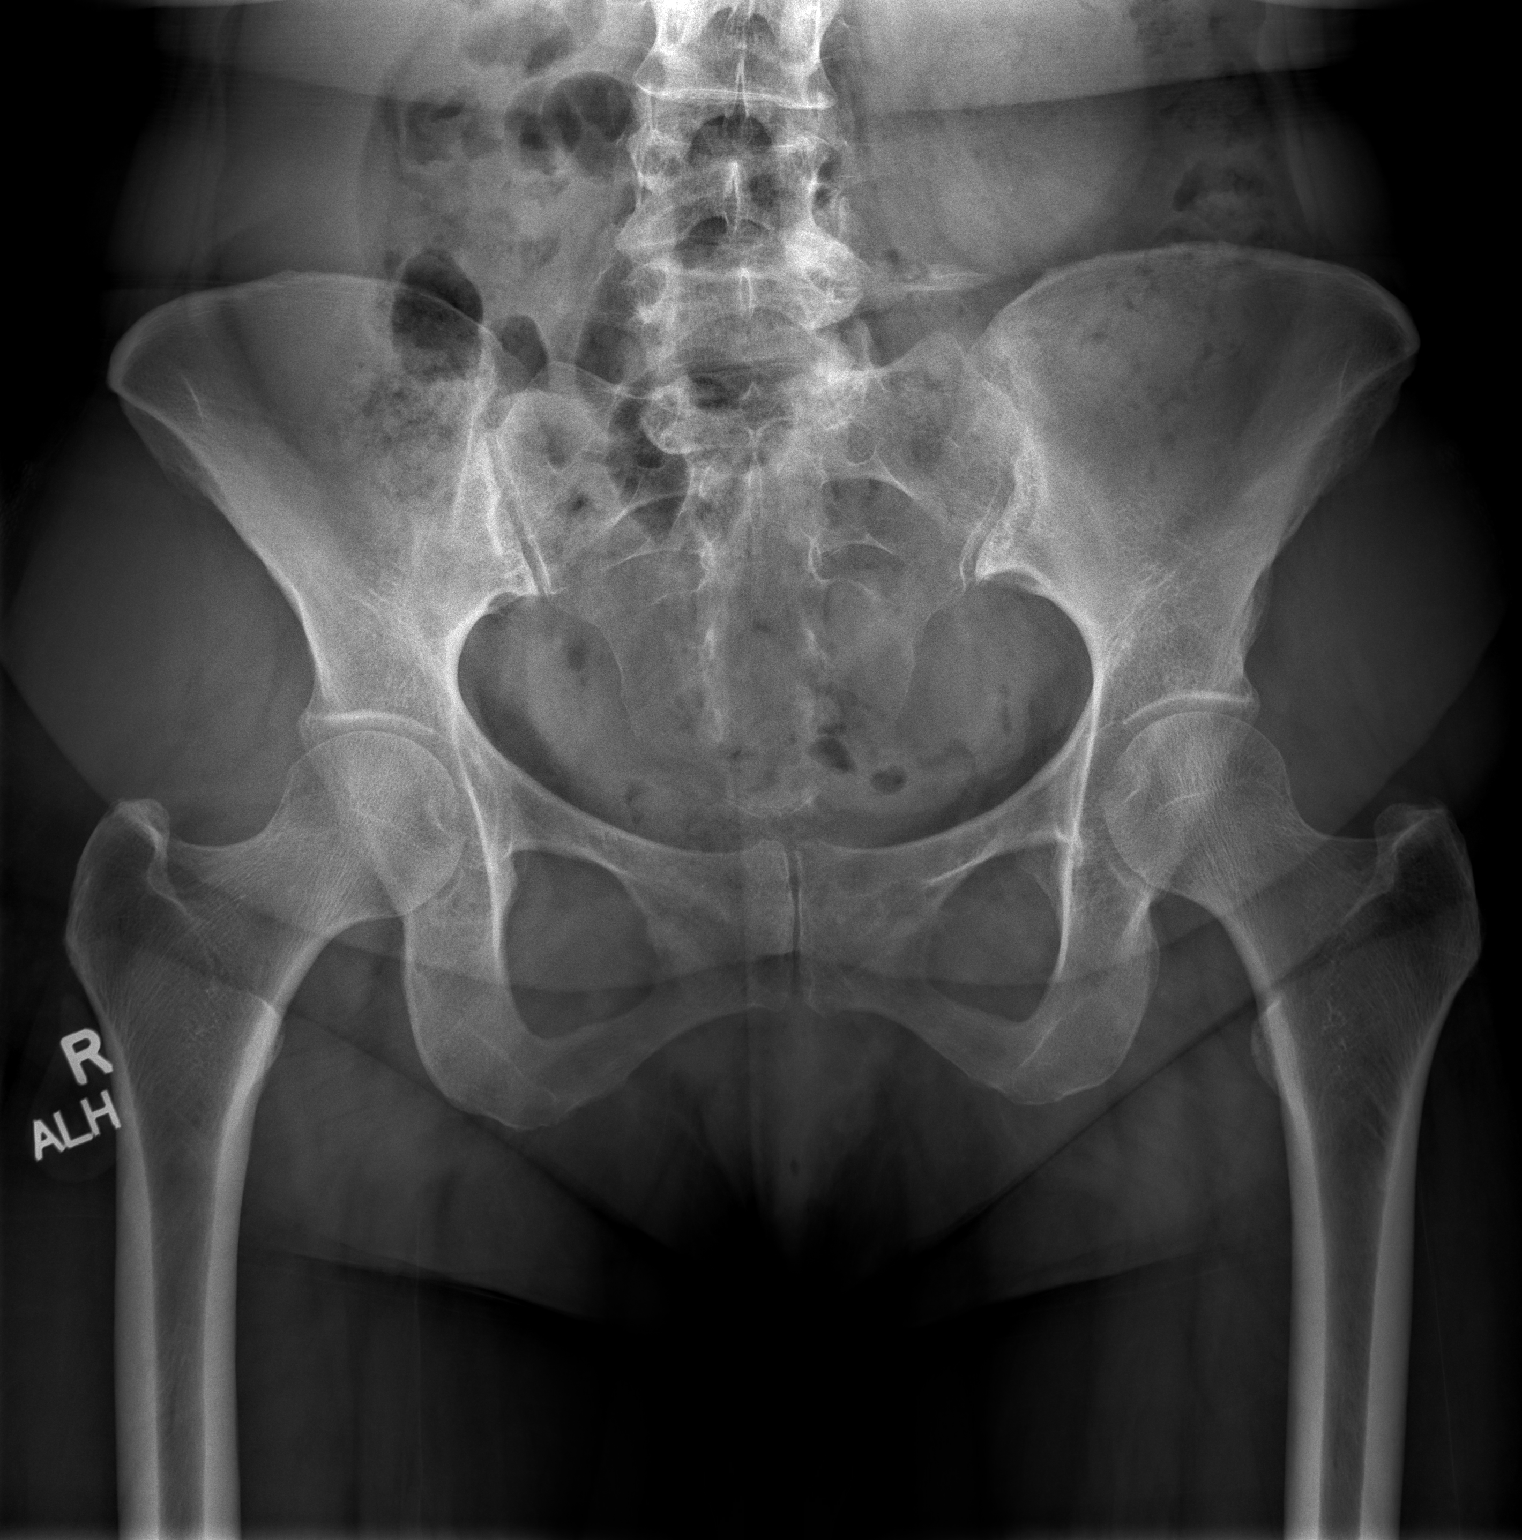

[1 of 1 positions shown; findings below may reference images not displayed]

FINDINGS: There is no evidence of pelvic fracture or diastasis. No pelvic bone
lesions are seen.
IMPRESSION: Negative.

## 2020-11-15 ENCOUNTER — Other Ambulatory Visit: Payer: Self-pay | Admitting: Chiropractic Medicine

## 2020-11-15 ENCOUNTER — Ambulatory Visit
Admission: RE | Admit: 2020-11-15 | Discharge: 2020-11-15 | Disposition: A | Discharge status: Home / Self Care | Payer: No Typology Code available for payment source | Source: Ambulatory Visit | Attending: Chiropractic Medicine | Admitting: Chiropractic Medicine

## 2020-11-15 ENCOUNTER — Other Ambulatory Visit: Payer: Self-pay

## 2020-11-15 DIAGNOSIS — R0781 Pleurodynia: Secondary | ICD-10-CM

## 2020-11-15 DIAGNOSIS — R059 Cough, unspecified: Secondary | ICD-10-CM

## 2020-11-15 DIAGNOSIS — R0789 Other chest pain: Secondary | ICD-10-CM

## 2020-11-15 IMAGING — CR DG CHEST 2V
2 series · 2 of 2 positions shown · non-contrast
Comparison: [DATE]

CLINICAL DATA: Cough history of breast cancer

EXAM:
CHEST - 2 VIEW

[w chest pa]
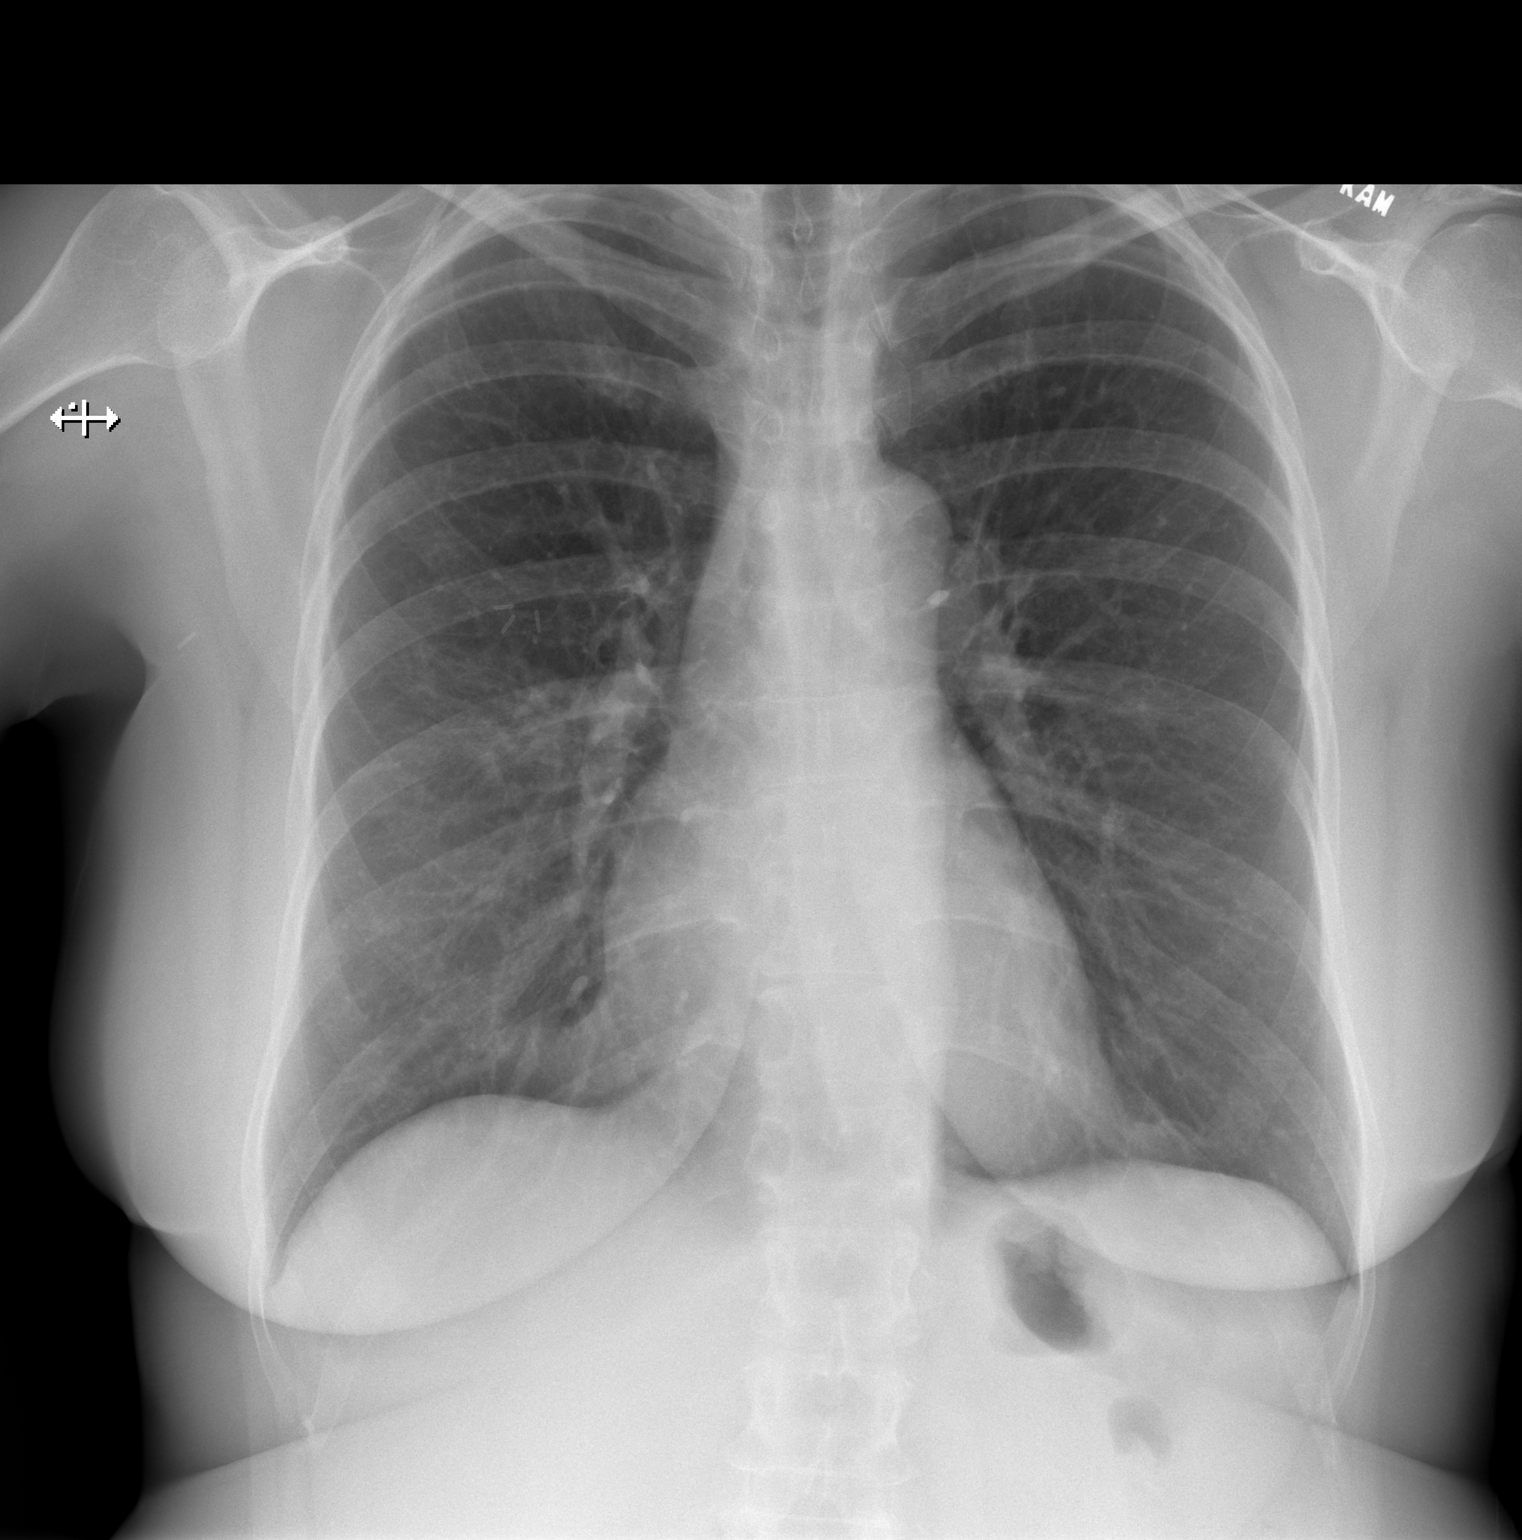

[w chest lat]
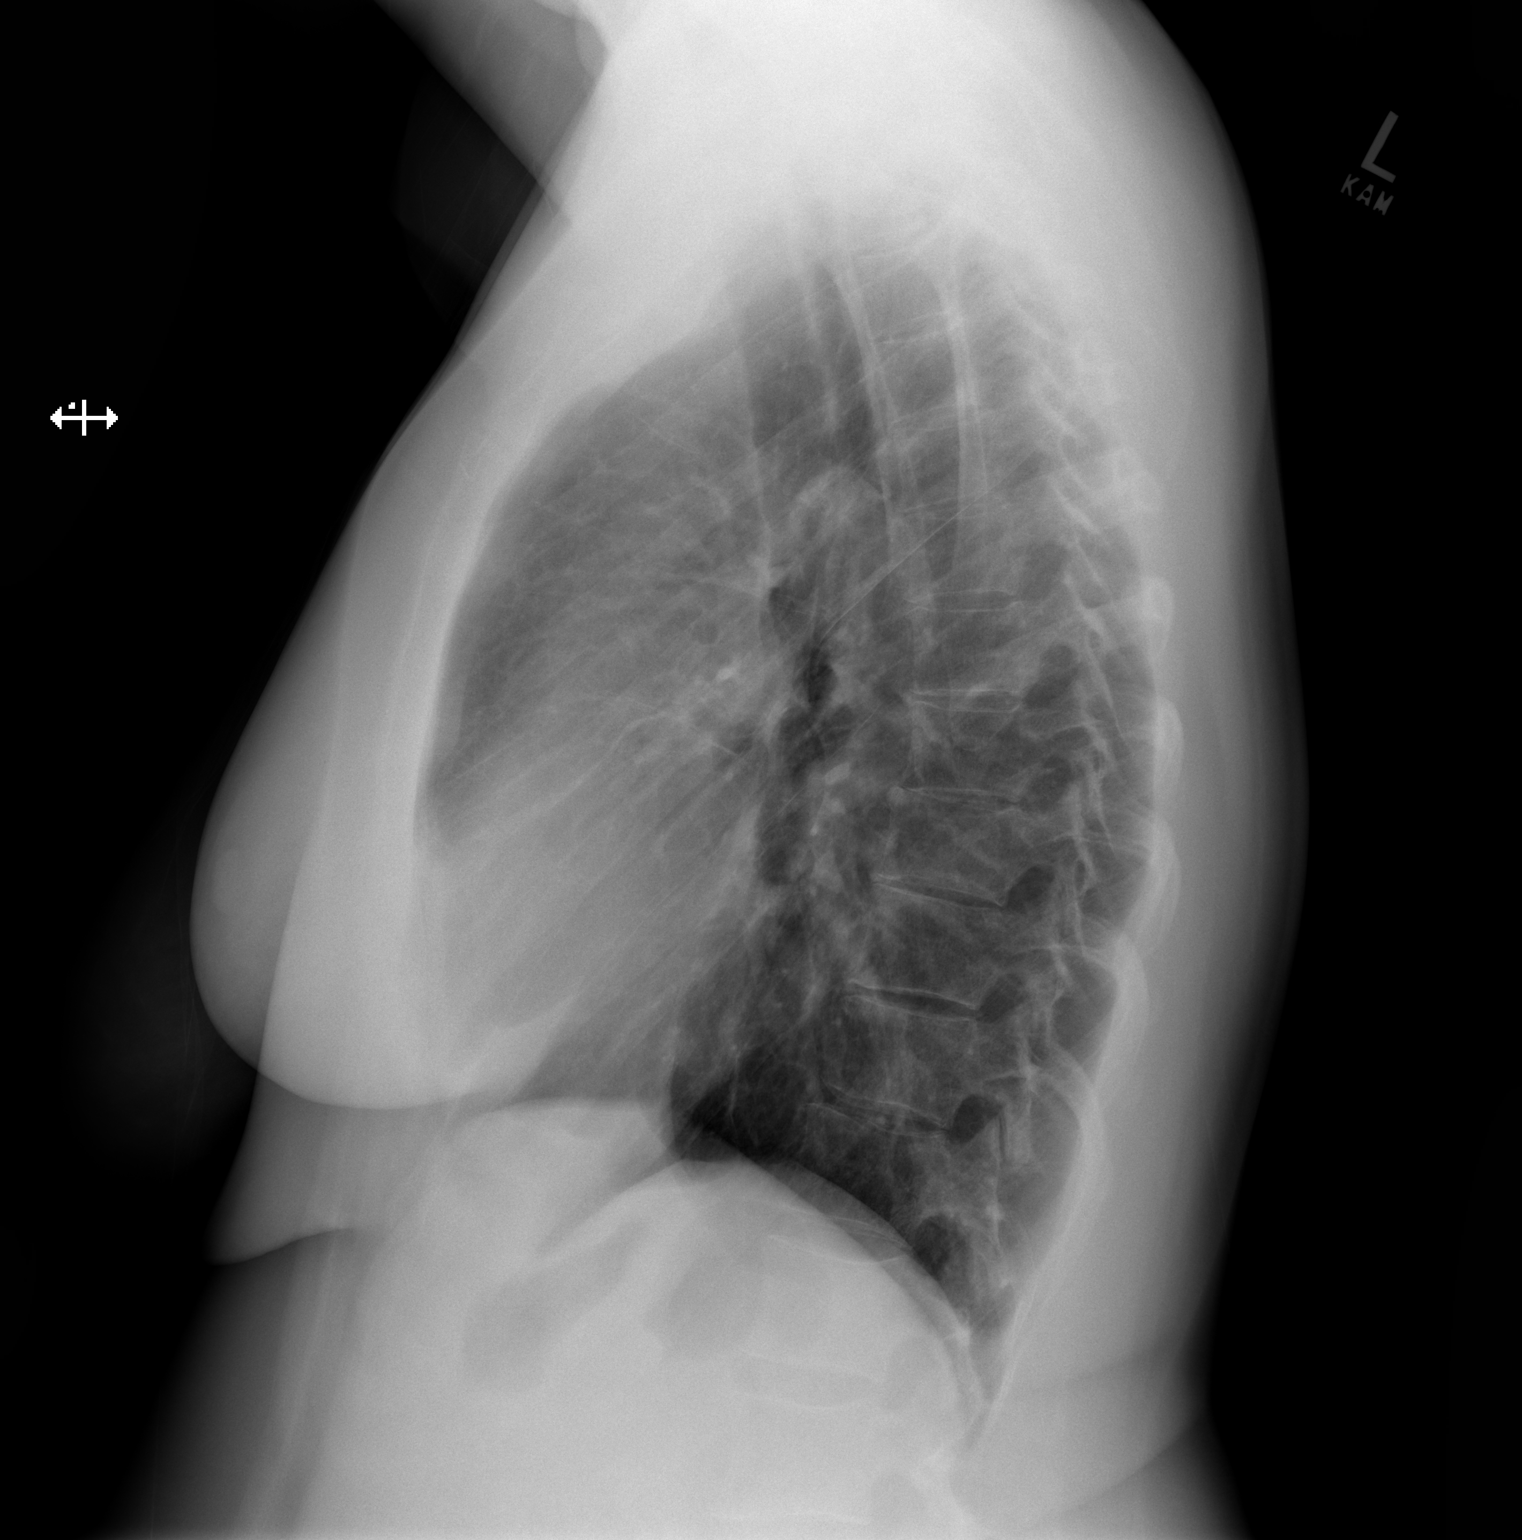

[2 of 2 positions shown; findings below may reference images not displayed]

FINDINGS: The heart size and mediastinal contours are within normal limits.
Both lungs are clear. The visualized skeletal structures are
unremarkable. Postsurgical changes of the right chest.
IMPRESSION: No active cardiopulmonary disease.

## 2020-11-30 ENCOUNTER — Emergency Department (HOSPITAL_COMMUNITY): Payer: Self-pay

## 2020-11-30 ENCOUNTER — Other Ambulatory Visit: Payer: Self-pay | Admitting: Hematology

## 2020-11-30 ENCOUNTER — Inpatient Hospital Stay (HOSPITAL_COMMUNITY): Payer: Self-pay

## 2020-11-30 ENCOUNTER — Inpatient Hospital Stay (HOSPITAL_COMMUNITY)
Admission: EM | Admit: 2020-11-30 | Discharge: 2020-12-10 | DRG: 026 | Disposition: A | Discharge status: Home / Self Care | Payer: Self-pay | Attending: Neurological Surgery | Admitting: Neurological Surgery

## 2020-11-30 ENCOUNTER — Encounter (HOSPITAL_COMMUNITY): Payer: Self-pay

## 2020-11-30 ENCOUNTER — Other Ambulatory Visit: Payer: Self-pay

## 2020-11-30 DIAGNOSIS — N200 Calculus of kidney: Secondary | ICD-10-CM | POA: Diagnosis present

## 2020-11-30 DIAGNOSIS — C7801 Secondary malignant neoplasm of right lung: Secondary | ICD-10-CM | POA: Diagnosis present

## 2020-11-30 DIAGNOSIS — Z1379 Encounter for other screening for genetic and chromosomal anomalies: Secondary | ICD-10-CM

## 2020-11-30 DIAGNOSIS — L299 Pruritus, unspecified: Secondary | ICD-10-CM | POA: Diagnosis not present

## 2020-11-30 DIAGNOSIS — Z800 Family history of malignant neoplasm of digestive organs: Secondary | ICD-10-CM

## 2020-11-30 DIAGNOSIS — Z170 Estrogen receptor positive status [ER+]: Secondary | ICD-10-CM

## 2020-11-30 DIAGNOSIS — C7951 Secondary malignant neoplasm of bone: Secondary | ICD-10-CM

## 2020-11-30 DIAGNOSIS — Z853 Personal history of malignant neoplasm of breast: Secondary | ICD-10-CM

## 2020-11-30 DIAGNOSIS — T402X5A Adverse effect of other opioids, initial encounter: Secondary | ICD-10-CM | POA: Diagnosis not present

## 2020-11-30 DIAGNOSIS — C801 Malignant (primary) neoplasm, unspecified: Secondary | ICD-10-CM

## 2020-11-30 DIAGNOSIS — M5431 Sciatica, right side: Secondary | ICD-10-CM | POA: Diagnosis present

## 2020-11-30 DIAGNOSIS — Z79810 Long term (current) use of selective estrogen receptor modulators (SERMs): Secondary | ICD-10-CM

## 2020-11-30 DIAGNOSIS — Z9221 Personal history of antineoplastic chemotherapy: Secondary | ICD-10-CM

## 2020-11-30 DIAGNOSIS — Z1501 Genetic susceptibility to malignant neoplasm of breast: Secondary | ICD-10-CM

## 2020-11-30 DIAGNOSIS — M8458XA Pathological fracture in neoplastic disease, other specified site, initial encounter for fracture: Secondary | ICD-10-CM | POA: Diagnosis present

## 2020-11-30 DIAGNOSIS — C50211 Malignant neoplasm of upper-inner quadrant of right female breast: Secondary | ICD-10-CM

## 2020-11-30 DIAGNOSIS — Z803 Family history of malignant neoplasm of breast: Secondary | ICD-10-CM

## 2020-11-30 DIAGNOSIS — C7802 Secondary malignant neoplasm of left lung: Secondary | ICD-10-CM | POA: Diagnosis present

## 2020-11-30 DIAGNOSIS — I951 Orthostatic hypotension: Secondary | ICD-10-CM

## 2020-11-30 DIAGNOSIS — N939 Abnormal uterine and vaginal bleeding, unspecified: Secondary | ICD-10-CM

## 2020-11-30 DIAGNOSIS — Z95828 Presence of other vascular implants and grafts: Secondary | ICD-10-CM

## 2020-11-30 DIAGNOSIS — G893 Neoplasm related pain (acute) (chronic): Secondary | ICD-10-CM | POA: Diagnosis present

## 2020-11-30 DIAGNOSIS — T380X5A Adverse effect of glucocorticoids and synthetic analogues, initial encounter: Secondary | ICD-10-CM | POA: Diagnosis not present

## 2020-11-30 DIAGNOSIS — Z923 Personal history of irradiation: Secondary | ICD-10-CM

## 2020-11-30 DIAGNOSIS — C7931 Secondary malignant neoplasm of brain: Principal | ICD-10-CM

## 2020-11-30 DIAGNOSIS — C7989 Secondary malignant neoplasm of other specified sites: Secondary | ICD-10-CM

## 2020-11-30 DIAGNOSIS — Z515 Encounter for palliative care: Secondary | ICD-10-CM

## 2020-11-30 DIAGNOSIS — Z90710 Acquired absence of both cervix and uterus: Secondary | ICD-10-CM

## 2020-11-30 DIAGNOSIS — Z20822 Contact with and (suspected) exposure to covid-19: Secondary | ICD-10-CM | POA: Diagnosis present

## 2020-11-30 DIAGNOSIS — D72828 Other elevated white blood cell count: Secondary | ICD-10-CM | POA: Diagnosis not present

## 2020-11-30 DIAGNOSIS — M8448XA Pathological fracture, other site, initial encounter for fracture: Secondary | ICD-10-CM

## 2020-11-30 DIAGNOSIS — Z801 Family history of malignant neoplasm of trachea, bronchus and lung: Secondary | ICD-10-CM

## 2020-11-30 DIAGNOSIS — D496 Neoplasm of unspecified behavior of brain: Secondary | ICD-10-CM | POA: Diagnosis present

## 2020-11-30 DIAGNOSIS — M479 Spondylosis, unspecified: Secondary | ICD-10-CM | POA: Diagnosis present

## 2020-11-30 DIAGNOSIS — R443 Hallucinations, unspecified: Secondary | ICD-10-CM | POA: Diagnosis not present

## 2020-11-30 DIAGNOSIS — C799 Secondary malignant neoplasm of unspecified site: Secondary | ICD-10-CM

## 2020-11-30 DIAGNOSIS — Z79899 Other long term (current) drug therapy: Secondary | ICD-10-CM

## 2020-11-30 DIAGNOSIS — C787 Secondary malignant neoplasm of liver and intrahepatic bile duct: Secondary | ICD-10-CM | POA: Diagnosis present

## 2020-11-30 LAB — CBC WITH DIFFERENTIAL/PLATELET
Abs Immature Granulocytes: 0.06 10*3/uL (ref 0.00–0.07)
Basophils Absolute: 0.1 10*3/uL (ref 0.0–0.1)
Basophils Relative: 1 %
Eosinophils Absolute: 0.1 10*3/uL (ref 0.0–0.5)
Eosinophils Relative: 2 %
HCT: 36.6 % (ref 36.0–46.0)
Hemoglobin: 11.9 g/dL — ABNORMAL LOW (ref 12.0–15.0)
Immature Granulocytes: 1 %
Lymphocytes Relative: 17 %
Lymphs Abs: 1.5 10*3/uL (ref 0.7–4.0)
MCH: 30.3 pg (ref 26.0–34.0)
MCHC: 32.5 g/dL (ref 30.0–36.0)
MCV: 93.1 fL (ref 80.0–100.0)
Monocytes Absolute: 1 10*3/uL (ref 0.1–1.0)
Monocytes Relative: 12 %
Neutro Abs: 5.9 10*3/uL (ref 1.7–7.7)
Neutrophils Relative %: 67 %
Platelets: 253 10*3/uL (ref 150–400)
RBC: 3.93 MIL/uL (ref 3.87–5.11)
RDW: 11.7 % (ref 11.5–15.5)
WBC: 8.7 10*3/uL (ref 4.0–10.5)
nRBC: 0 % (ref 0.0–0.2)

## 2020-11-30 LAB — URINALYSIS, ROUTINE W REFLEX MICROSCOPIC
Bilirubin Urine: NEGATIVE
Glucose, UA: NEGATIVE mg/dL
Hgb urine dipstick: NEGATIVE
Ketones, ur: NEGATIVE mg/dL
Nitrite: NEGATIVE
Protein, ur: NEGATIVE mg/dL
Specific Gravity, Urine: 1.009 (ref 1.005–1.030)
pH: 5 (ref 5.0–8.0)

## 2020-11-30 LAB — COMPREHENSIVE METABOLIC PANEL
ALT: 34 U/L (ref 0–44)
AST: 39 U/L (ref 15–41)
Albumin: 3.5 g/dL (ref 3.5–5.0)
Alkaline Phosphatase: 85 U/L (ref 38–126)
Anion gap: 7 (ref 5–15)
BUN: 16 mg/dL (ref 6–20)
CO2: 25 mmol/L (ref 22–32)
Calcium: 9.6 mg/dL (ref 8.9–10.3)
Chloride: 105 mmol/L (ref 98–111)
Creatinine, Ser: 0.8 mg/dL (ref 0.44–1.00)
GFR, Estimated: >60 mL/min (ref >60)
Glucose, Bld: 94 mg/dL (ref 70–99)
Potassium: 3.9 mmol/L (ref 3.5–5.1)
Sodium: 137 mmol/L (ref 135–145)
Total Bilirubin: 0.4 mg/dL (ref 0.3–1.2)
Total Protein: 6.9 g/dL (ref 6.5–8.1)

## 2020-11-30 LAB — RESP PANEL BY RT-PCR (FLU A&B, COVID) ARPGX2
Influenza A by PCR: NEGATIVE
Influenza B by PCR: NEGATIVE
SARS Coronavirus 2 by RT PCR: NEGATIVE

## 2020-11-30 IMAGING — MR MR HEAD WO/W CM
15 of 27 series · 28 of 48 positions shown · IV contrast (gadavist)
Comparison: None.

CLINICAL DATA: Metastatic breast cancer, staging

EXAM:
MRI HEAD WITHOUT AND WITH CONTRAST
TECHNIQUE: Multiplanar, multiecho pulse sequences of the brain and surrounding
structures were obtained without and with intravenous contrast.
CONTRAST:  8mL GADAVIST GADOBUTROL 1 MMOL/ML IV SOLN

[Series 5: DWI · axial · 3.0mm · 1.36mm/px · z∈[-70,+94]mm · 3 of 110 slices shown (1 of 2)]
[im 1/110]
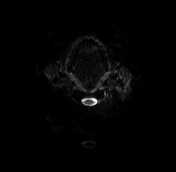
[im 55/110]
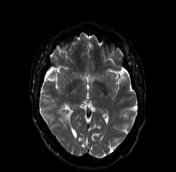
[im 110/110]
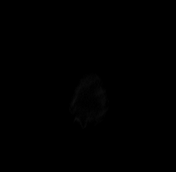

[Series 6: DWI · axial · 3.0mm · 1.36mm/px · 1 of 56 slices shown (2 of 2)]
[im 1/56]
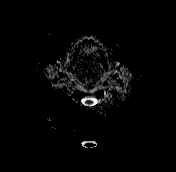

[Series 7: T1 · sagittal · 5.0mm · 0.75mm/px · 1 of 27 slices shown (1 of 4)]
[im 1/27]
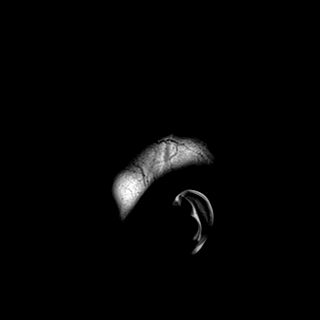

[Series 8: T2 · axial · 5.0mm · 0.62mm/px · 1 of 26 slices shown (1 of 2)]
[im 1/26]
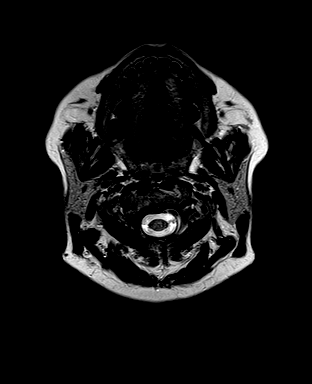

[Series 10: swi_images · axial · 3.0mm · 0.75mm/px · z∈[-98,+114]mm · 2 of 72 slices shown]
[im 1/72]
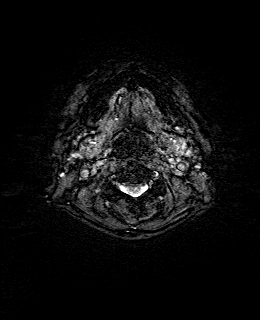
[im 72/72]
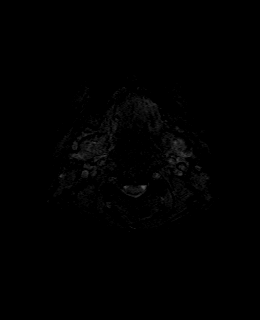

[Series 11: cor dwi_tracew · coronal · 5.0mm · 1.53mm/px · 1 of 66 slices shown]
[im 1/66]
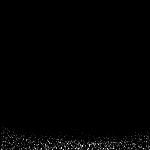

[Series 12: cor dwi_adc · coronal · 5.0mm · 1.53mm/px · 1 of 32 slices shown]
[im 1/32]
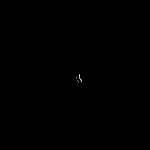

[Series 13: T2 · coronal · 5.0mm · 0.57mm/px · 1 of 33 slices shown (2 of 2)]
[im 1/33]
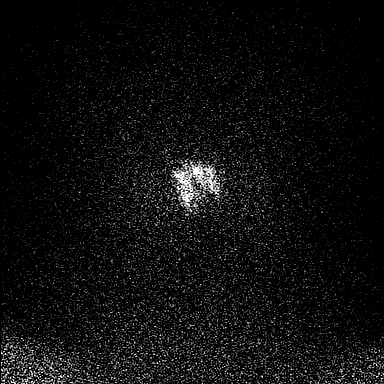

[Series 18: FLAIR · axial · 3.0mm · 0.75mm/px · 1 of 52 slices shown]
[im 1/52]
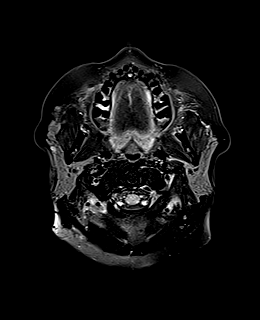

[Series 19: T1 · axial · 1.0mm · 0.94mm/px · z∈[-78,+79]mm · 5 of 160 slices shown (2 of 4)]
[im 1/160]
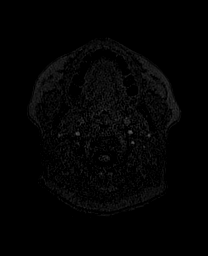
[im 40/160]
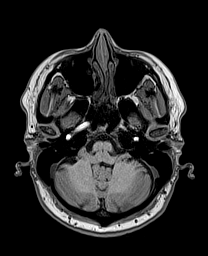
[im 80/160]
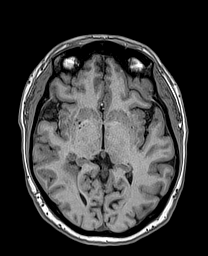
[im 120/160]
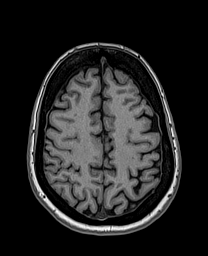
[im 160/160]
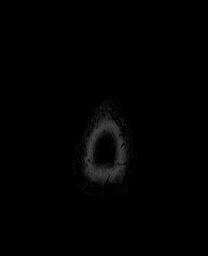

[Series 24: T1 post-contrast · coronal · 5.0mm · 0.43mm/px · 1 of 33 slices shown (1 of 3)]
[im 1/33]
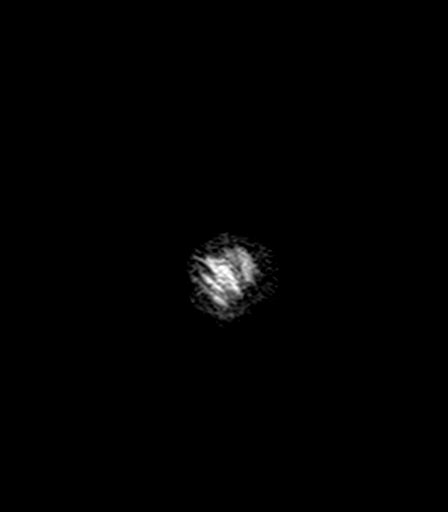

[Series 25: T1 post-contrast · axial · 1.0mm · 0.94mm/px · z∈[-70,+85]mm · 6 of 160 slices shown (2 of 3)]
[im 1/160]
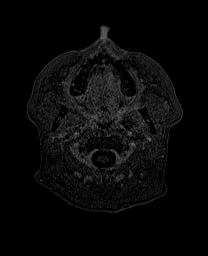
[im 32/160]
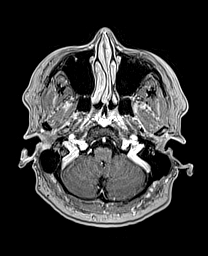
[im 64/160]
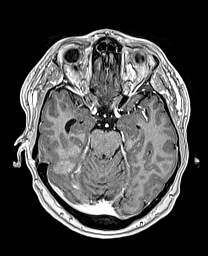
[im 96/160]
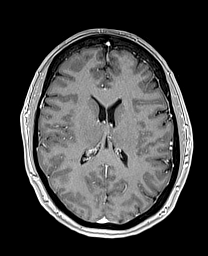
[im 128/160]
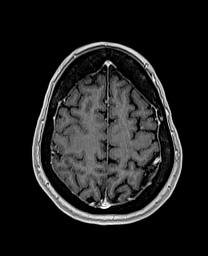
[im 160/160]
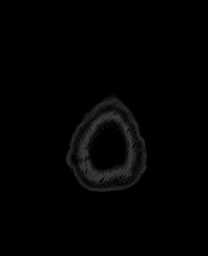

[Series 26: T1 · sagittal · 4.0mm · 0.94mm/px · 1 of 35 slices shown (3 of 4)]
[im 1/35]
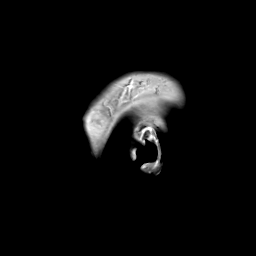

[Series 27: T1 · coronal · 4.0mm · 0.94mm/px · 2 of 45 slices shown (4 of 4)]
[im 1/45]
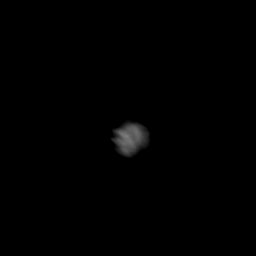
[im 45/45]
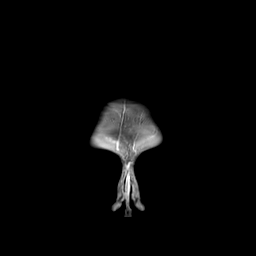

[Series 28: T1 post-contrast · sagittal · 5.0mm · 0.75mm/px · 1 of 26 slices shown (3 of 3)]
[im 1/26]
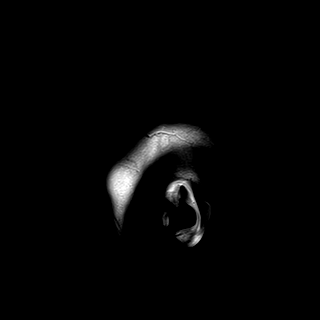

[28 of 48 positions shown; findings below may reference images not displayed]

FINDINGS: Brain: Multiple enhancing lesions the brain compatible with
metastatic disease.

Solid enhancing lesion left posterior cerebellum 10 mm axial image
48.

Mixed cystic and solid lesion in the right posterior temporal lobe
measures 38 x 19 mm on axial images. Mild surrounding edema. Mild
associated hemorrhage.

18 mm enhancing and partially cystic mass in the right parietal lobe
with mild hemorrhage

3 mm enhancing lesion left frontal cortex axial image 128

Ventricle size normal. No midline shift. No acute or chronic
ischemic changes.

Vascular: Normal arterial flow voids

Skull and upper cervical spine: Enhancing lesions in the frontal
bone bilaterally compatible with metastatic disease. Enhancing in
the left lateral parietal bone.

Sinuses/Orbits: Paranasal sinuses clear.  Negative orbit

Other: None
IMPRESSION: Multiple metastatic lesions in the brain. The largest lesion is in
the right posterior temporal lobe and shows cystic change and
hemorrhage. Additional lesions as above.

No midline shift.

Metastatic disease to the calvarium.

## 2020-11-30 IMAGING — CT CT RENAL STONE PROTOCOL
2 of 4 series · 11 of 46 positions shown, 12 images · non-contrast
Comparison: Only chest x-rays are available for comparison.
COMPARISON: Only chest x-rays are available for comparison.

Addendum:
CLINICAL DATA: RIGHT flank pain, kidney stone suspected.

EXAM:
CT ABDOMEN AND PELVIS WITHOUT CONTRAST
TECHNIQUE: Multidetector CT imaging of the abdomen and pelvis was performed
following the standard protocol without IV contrast.

[Series 2: axial st · axial · 0.84mm/px · z∈[-636,-216]mm · 8 of 103 slices shown, 9 images]
[im 13/103  soft-tissue]
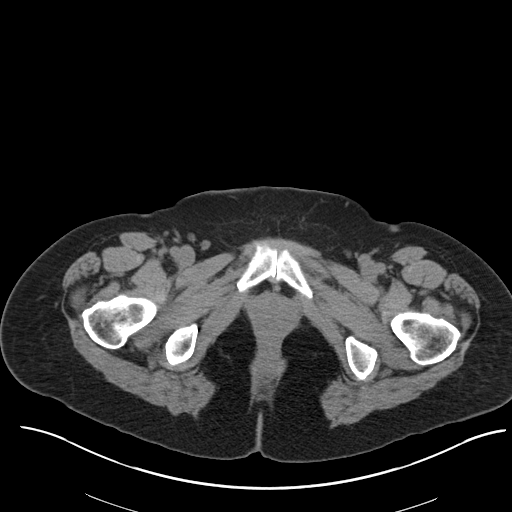
[im 13/103  bone]
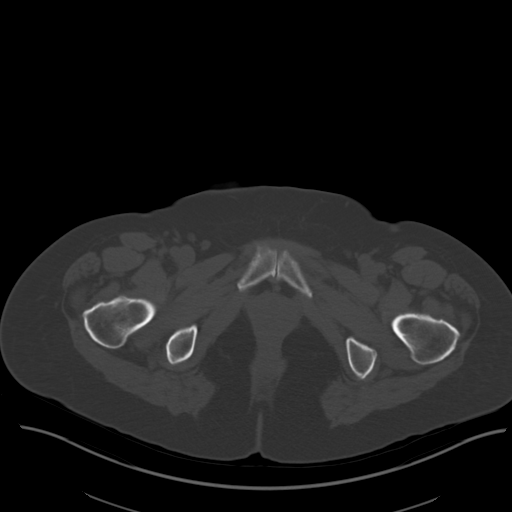
[im 25/103  soft-tissue]
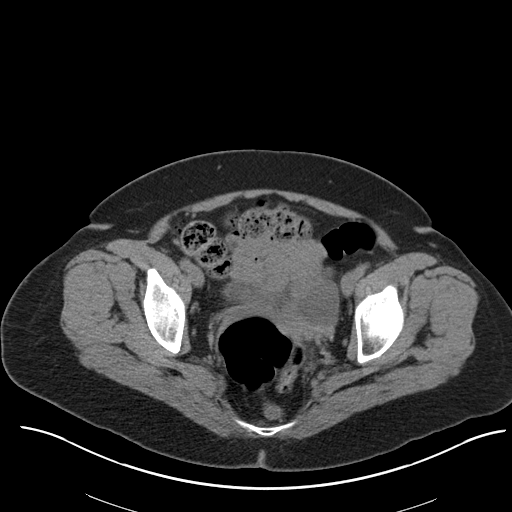
[im 37/103  soft-tissue]
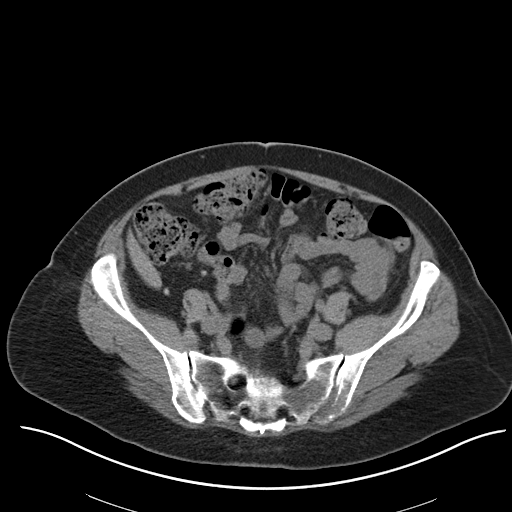
[im 49/103  soft-tissue]
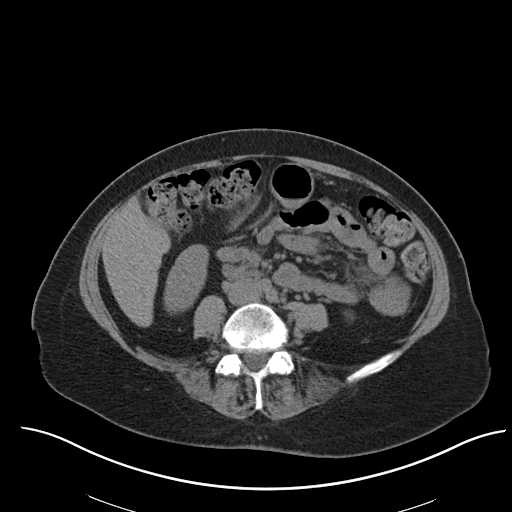
[im 61/103  soft-tissue]
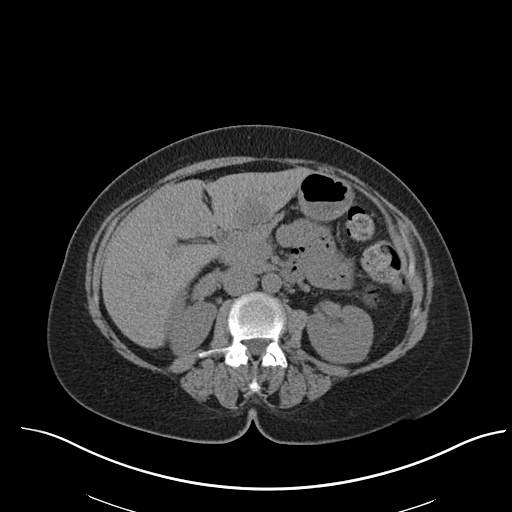
[im 73/103  soft-tissue]
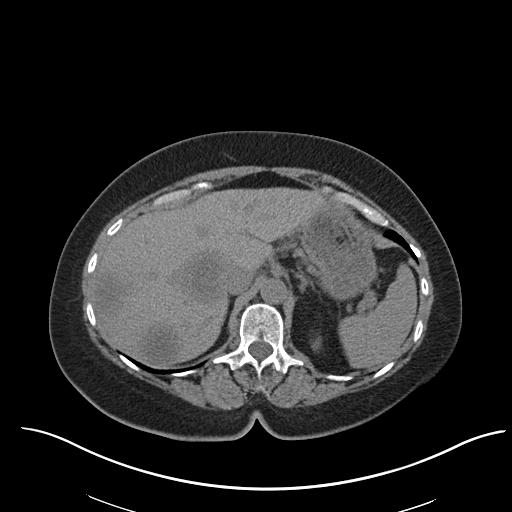
[im 85/103  soft-tissue]
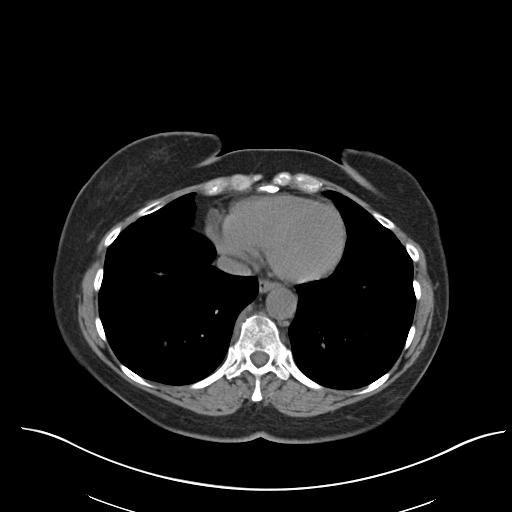
[im 97/103  soft-tissue]
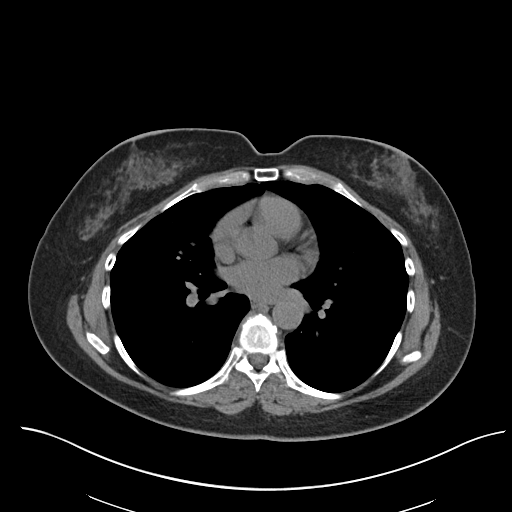

[Series 5: coronal · coronal · 0.85mm/px · 3 of 158 slices shown]
[im 53/158  soft-tissue]
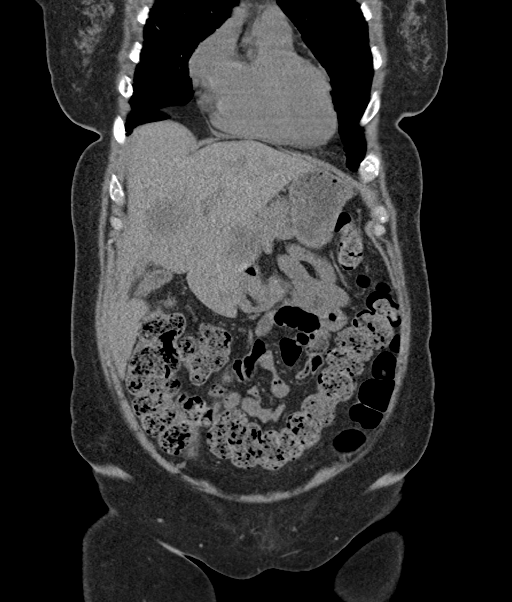
[im 70/158  soft-tissue]
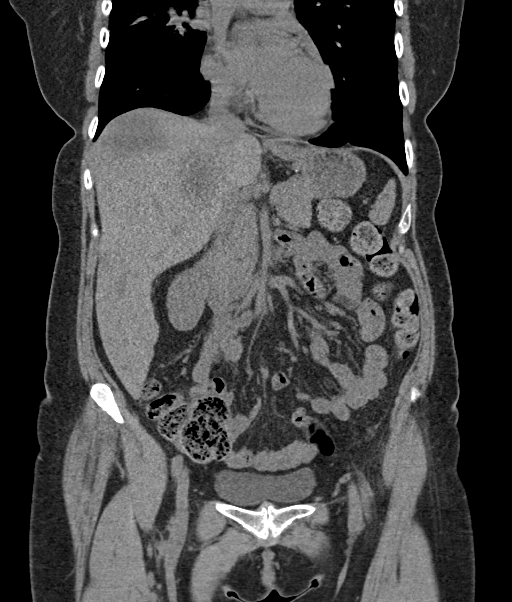
[im 88/158  soft-tissue]
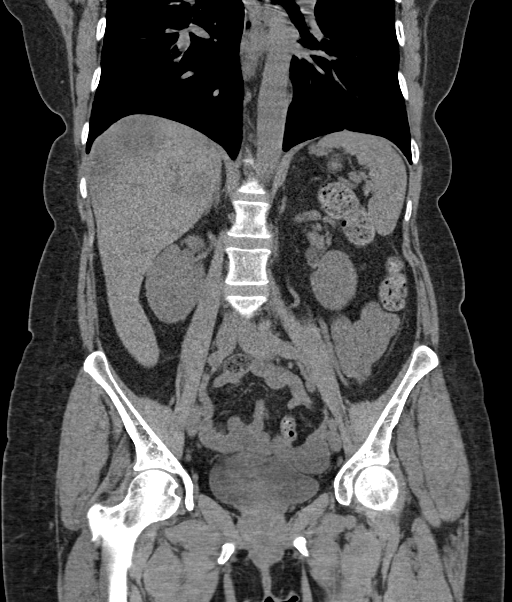

[11 of 46 positions shown; findings below may reference images not displayed]

FINDINGS: Lower chest: Spiculated nodular area in the RIGHT lower lobe (image
[DATE]) 1.6 x 1.5 cm. Septal thickening extending to the periphery of
the RIGHT chest in the RIGHT middle lobe. Additional spiculated
nodule 12 mm (image 44/4) RIGHT lower lobe.

Smaller nodule at the periphery measuring 3-4 mm. No effusion. No
consolidation.

Hepatobiliary: Large hepatic masses. Largest in the RIGHT lobe
(image [DATE] 5.8 x 5.6 cm.

Central hepatic mass (image 32/2 4.5 x 3.5 cm.

LEFT lobe lesion (image 42/22.4 cm. Additional lesions scattered
throughout the liver, at least 10 total lesions. No pericholecystic
stranding.

Pancreas: Pancreas with normal contour.  No sign of inflammation.

Spleen: Spleen normal in size and contour.

Adrenals/Urinary Tract: Adrenal glands are normal.

Nephrolithiasis in the lower pole of the LEFT kidney 3 mm (image
50/2) smooth renal contours. No hydronephrosis. No perinephric
stranding. Urinary bladder under distended.

Stomach/Bowel: Stomach is unremarkable. Small bowel normal caliber.
Appendix is normal. No acute colonic process.

Vascular/Lymphatic: Normal caliber of the abdominal aorta and IVC.
There is no gastrohepatic or hepatoduodenal ligament
lymphadenopathy. No retroperitoneal or mesenteric lymphadenopathy.

No pelvic sidewall lymphadenopathy.

Reproductive: Post hysterectomy. Cystic area in the LEFT adnexa
measuring 4.2 x 3.2 cm in axial dimension and 4.0 cm in greatest
craniocaudal extent.

Other: No ascites.  No free air.

Musculoskeletal: Diffuse bony metastatic disease with lytic
features. For instance, in the LEFT posterior iliac with destructive
features on image 65 of series 2 is a 3.1 cm lytic focus.a this is
associated with pathologic fracture of the posterior LEFT iliac.

Lytic lesions are seen to varying degrees throughout the thoracic
and the lumbar spine nearly every level of the spine is involved.
Also with a large sacral metastasis on image 63 of series 2
measuring 3.6 x 3.6 cm. Mild loss of height due to pathologic
fracture at the T9 vertebral level approximately 10-15% loss of
height. Lytic metastases at the remaining levels of the thoracic
spine with mild loss of height at T12 as well likely due to
pathologic endplate fracture.

At the T10 level there is suggestion of soft tissue extending
posteriorly into the central canal, difficult to determine on
noncontrast evaluation but with posterior cortical destruction and
potential extension is much as 7 mm into the central canal posterior
to the T10 level.

Multilevel metastatic disease in the lumbar spine as well largest
lesion approximately 1.5 cm in the L3 vertebral level. Pathologic
fracture of the superior endplate on the RIGHT at L4 with endplate
concavity, mild endplate concavity at this level.
IMPRESSION: 1. Diffuse bony metastatic disease with lytic features and
associated pathologic fractures in the thoracic, lumbar spine and in
the pelvis.
2. Potential soft tissue extension into the central canal at the T10
level. Correlate with symptoms and suggest MRI for further
assessment as warranted.
3. Spiculated nodules in the chest with morphologic features that
would be more suggestive of bronchogenic neoplasm but given
multiplicity could also be seen with metastatic disease.
4. Widespread metastatic disease in the liver.
5. Large hepatic masses, at least 10 total lesions. Findings are
concerning for metastatic disease.
6. Cystic area in the LEFT adnexa measuring 4.2 x 3.2 cm in axial
dimension and 4.0 cm in greatest craniocaudal extent. This is
indeterminate, standard follow-up recommendations may not apply in
this instance. Normally no follow-up would be recommended, however
in the current context could consider follow-up sonogram for further
evaluation on a nonemergent basis.
7. Pathologic fracture of the superior endplate on the RIGHT at L4
with endplate concavity, mild endplate concavity at this level.
8. Nonobstructive LEFT nephrolithiasis.
9. Aortic atherosclerosis.

ADDENDUM:
These results were called by telephone at the time of interpretation
on [DATE] at [DATE] to provider ASTRYD , who
verbally acknowledged these results.

*** End of Addendum ***
FINDINGS: Lower chest: Spiculated nodular area in the RIGHT lower lobe (image
[DATE]) 1.6 x 1.5 cm. Septal thickening extending to the periphery of
the RIGHT chest in the RIGHT middle lobe. Additional spiculated
nodule 12 mm (image 44/4) RIGHT lower lobe.

Smaller nodule at the periphery measuring 3-4 mm. No effusion. No
consolidation.

Hepatobiliary: Large hepatic masses. Largest in the RIGHT lobe
(image [DATE] 5.8 x 5.6 cm.

Central hepatic mass (image 32/2 4.5 x 3.5 cm.

LEFT lobe lesion (image 42/22.4 cm. Additional lesions scattered
throughout the liver, at least 10 total lesions. No pericholecystic
stranding.

Pancreas: Pancreas with normal contour.  No sign of inflammation.

Spleen: Spleen normal in size and contour.

Adrenals/Urinary Tract: Adrenal glands are normal.

Nephrolithiasis in the lower pole of the LEFT kidney 3 mm (image
50/2) smooth renal contours. No hydronephrosis. No perinephric
stranding. Urinary bladder under distended.

Stomach/Bowel: Stomach is unremarkable. Small bowel normal caliber.
Appendix is normal. No acute colonic process.

Vascular/Lymphatic: Normal caliber of the abdominal aorta and IVC.
There is no gastrohepatic or hepatoduodenal ligament
lymphadenopathy. No retroperitoneal or mesenteric lymphadenopathy.

No pelvic sidewall lymphadenopathy.

Reproductive: Post hysterectomy. Cystic area in the LEFT adnexa
measuring 4.2 x 3.2 cm in axial dimension and 4.0 cm in greatest
craniocaudal extent.

Other: No ascites.  No free air.

Musculoskeletal: Diffuse bony metastatic disease with lytic
features. For instance, in the LEFT posterior iliac with destructive
features on image 65 of series 2 is a 3.1 cm lytic focus.a this is
associated with pathologic fracture of the posterior LEFT iliac.

Lytic lesions are seen to varying degrees throughout the thoracic
and the lumbar spine nearly every level of the spine is involved.
Also with a large sacral metastasis on image 63 of series 2
measuring 3.6 x 3.6 cm. Mild loss of height due to pathologic
fracture at the T9 vertebral level approximately 10-15% loss of
height. Lytic metastases at the remaining levels of the thoracic
spine with mild loss of height at T12 as well likely due to
pathologic endplate fracture.

At the T10 level there is suggestion of soft tissue extending
posteriorly into the central canal, difficult to determine on
noncontrast evaluation but with posterior cortical destruction and
potential extension is much as 7 mm into the central canal posterior
to the T10 level.

Multilevel metastatic disease in the lumbar spine as well largest
lesion approximately 1.5 cm in the L3 vertebral level. Pathologic
fracture of the superior endplate on the RIGHT at L4 with endplate
concavity, mild endplate concavity at this level.
IMPRESSION: 1. Diffuse bony metastatic disease with lytic features and
associated pathologic fractures in the thoracic, lumbar spine and in
the pelvis.
2. Potential soft tissue extension into the central canal at the T10
level. Correlate with symptoms and suggest MRI for further
assessment as warranted.
3. Spiculated nodules in the chest with morphologic features that
would be more suggestive of bronchogenic neoplasm but given
multiplicity could also be seen with metastatic disease.
4. Widespread metastatic disease in the liver.
5. Large hepatic masses, at least 10 total lesions. Findings are
concerning for metastatic disease.
6. Cystic area in the LEFT adnexa measuring 4.2 x 3.2 cm in axial
dimension and 4.0 cm in greatest craniocaudal extent. This is
indeterminate, standard follow-up recommendations may not apply in
this instance. Normally no follow-up would be recommended, however
in the current context could consider follow-up sonogram for further
evaluation on a nonemergent basis.
7. Pathologic fracture of the superior endplate on the RIGHT at L4
with endplate concavity, mild endplate concavity at this level.
8. Nonobstructive LEFT nephrolithiasis.
9. Aortic atherosclerosis.

## 2020-11-30 IMAGING — CT CT CHEST-ABD-PELV W/ CM
2 of 5 series · 12 of 36 positions shown, 14 images · IV contrast (OMNIPAQUE)
Comparison: [DATE] unenhanced CT abdomen/pelvis.

CLINICAL DATA: Inpatient. Widespread metastatic disease to the
brain, bones, liver and lungs. History of breast cancer. Right
abdominal pain.

EXAM:
CT CHEST, ABDOMEN, AND PELVIS WITH CONTRAST
TECHNIQUE: Multidetector CT imaging of the chest, abdomen and pelvis was
performed following the standard protocol during bolus
administration of intravenous contrast.
CONTRAST:  75mL OMNIPAQUE IOHEXOL 300 MG/ML  SOLN

[Series 2: cap with · axial · 0.78mm/px · z∈[+1041,+1566]mm · 9 of 133 slices shown, 11 images]
[im 14/133  mediastinal]
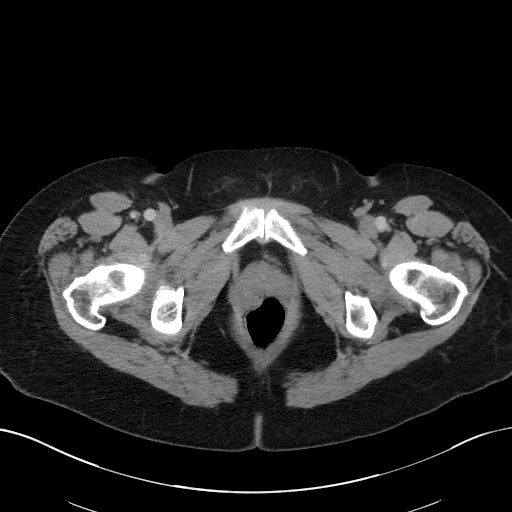
[im 14/133  bone]
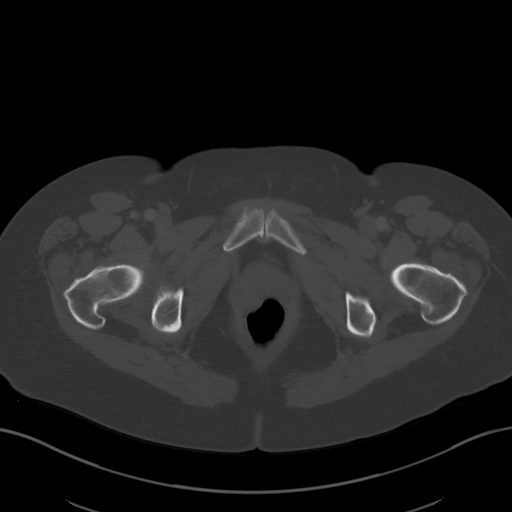
[im 27/133  mediastinal]
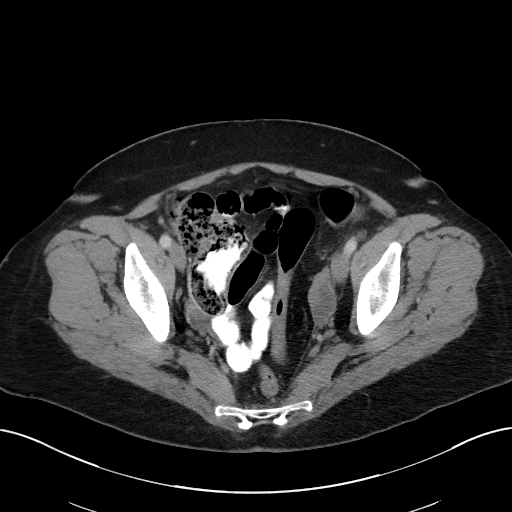
[im 40/133  mediastinal]
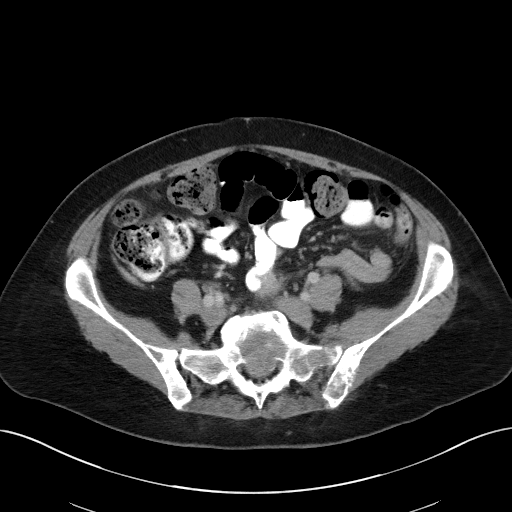
[im 53/133  mediastinal]
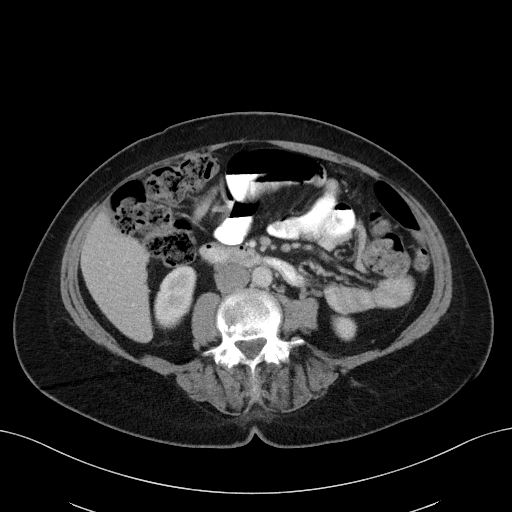
[im 67/133  mediastinal]
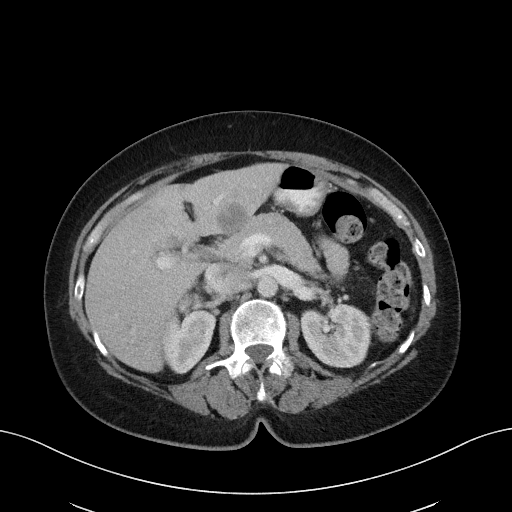
[im 80/133  mediastinal]
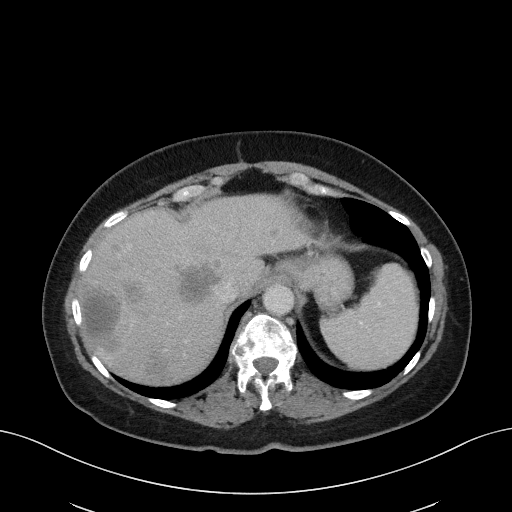
[im 93/133  mediastinal]
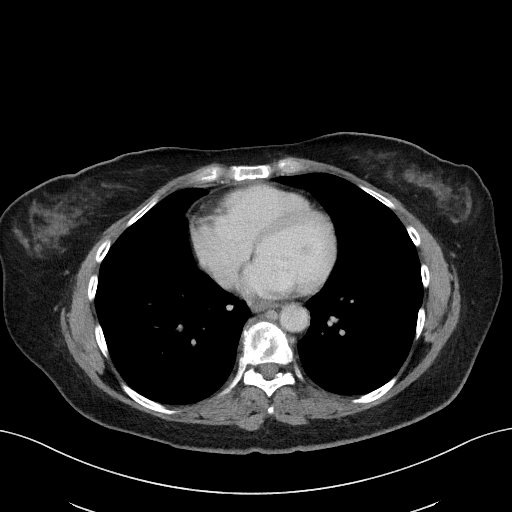
[im 106/133  mediastinal]
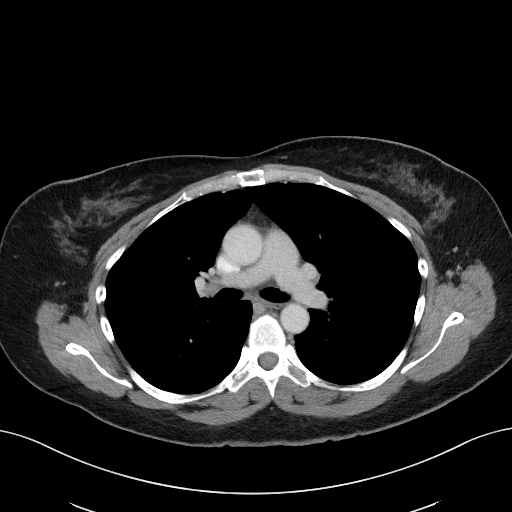
[im 119/133  mediastinal]
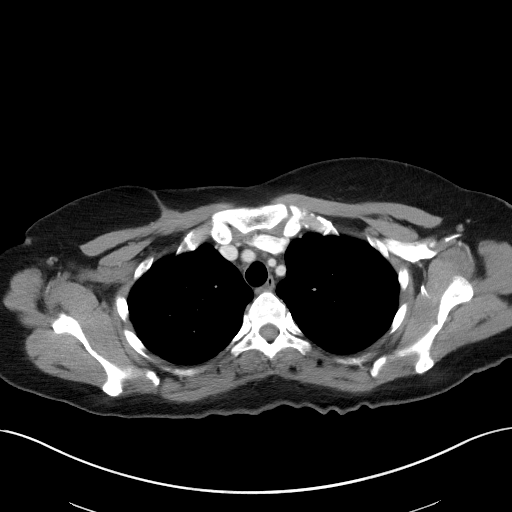
[im 119/133  bone]
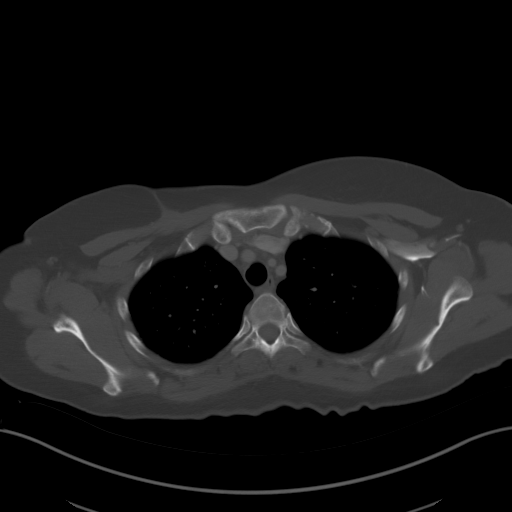

[Series 5: coronals · coronal · 0.68mm/px · 3 of 137 slices shown]
[im 28/137  mediastinal]
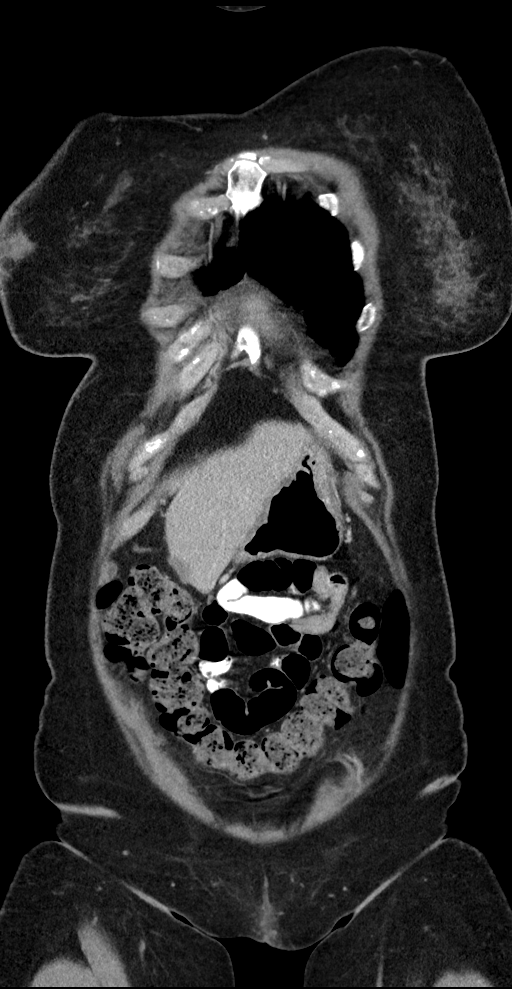
[im 55/137  mediastinal]
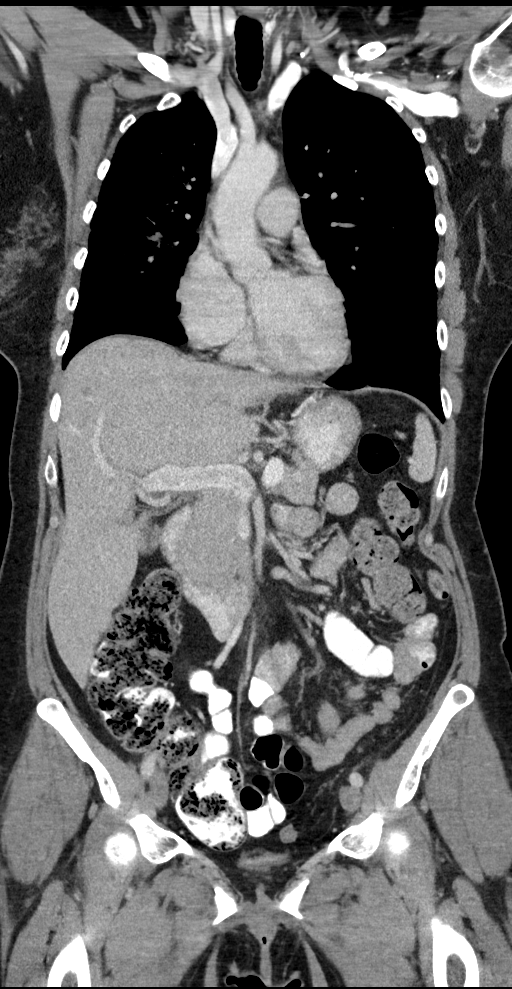
[im 82/137  mediastinal]
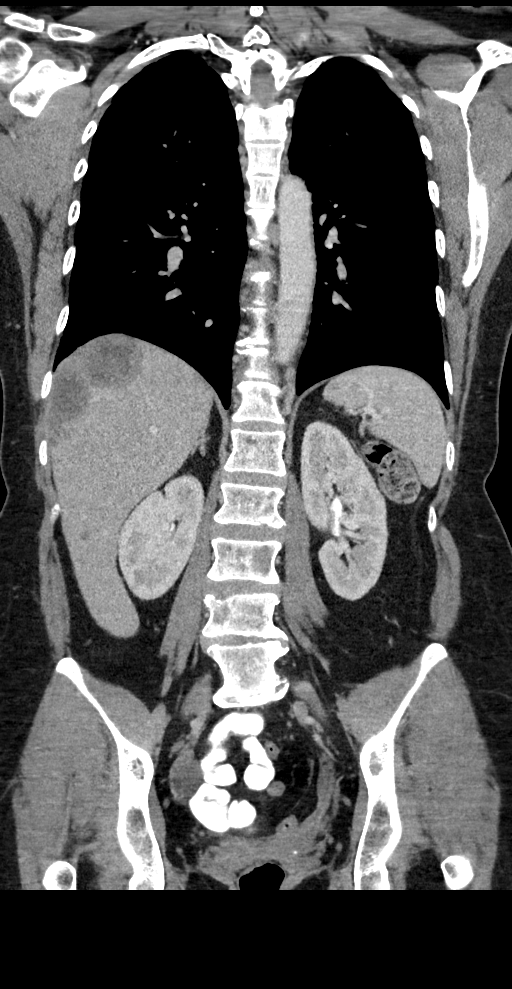

[12 of 36 positions shown; findings below may reference images not displayed]

FINDINGS: CT CHEST FINDINGS

Cardiovascular: Normal heart size. No significant pericardial
effusion/thickening. Great vessels are normal in course and caliber.
No central pulmonary emboli.

Mediastinum/Nodes: Subcentimeter hypodense left thyroid nodule. Not
clinically significant; no follow-up imaging recommended (ref: [HOSPITAL]. [DATE]): 143-50). Unremarkable esophagus.
Surgical clip in the right axilla. No pathologically enlarged
axillary nodes. No mediastinal or hilar adenopathy.

Lungs/Pleura: No pneumothorax. No pleural effusion. Several (greater
than 15) irregular solid pulmonary nodules scattered throughout the
right greater than left lungs, largest 2.2 cm in the central right
middle lobe (series 4/image 79), 1.5 cm in the anterior right lower
lobe (series 4/image 105) and 0.7 cm in the superior segment right
lower lobe (series 4/image 76). Minimal patchy subpleural
reticulation in the anterior mid to upper right lung, compatible
with minimal radiation fibrosis. Mild platelike atelectasis in the
right middle lobe peripheral to the central lesion.

Musculoskeletal: Widespread lytic osseous lesions in the thoracic
spine with associated mild pathologic fractures of the T9, T11 and
superior T12 vertebral bodies. Faint lytic lesion in the left
manubrium. Mild thoracic spondylosis.

CT ABDOMEN PELVIS FINDINGS

Hepatobiliary: Numerous (greater than 10) hypoenhancing liver masses
scattered throughout the liver, largest 6.6 x 5.6 cm at the right
liver dome (series 2/image 49), 5.0 x 4.0 cm in the central right
liver adjacent to the IVC (series 2/image 59) and 4.2 x 2.8 cm in
the segment 4 left liver (series 2/image 63). Normal gallbladder
with no radiopaque cholelithiasis. No biliary ductal dilatation.

Pancreas: Normal, with no mass or duct dilation.

Spleen: Normal size. No mass.

Adrenals/Urinary Tract: Normal adrenals. Subcentimeter hypodense
anterior interpolar right renal cortical lesion is too small to
characterize. Otherwise normal kidneys, with no hydronephrosis.
Normal collapsed bladder.

Stomach/Bowel: Normal non-distended stomach. Normal caliber small
bowel with no small bowel wall thickening. Normal appendix. Oral
contrast transits to the colon. Minimal sigmoid diverticulosis with
no large bowel wall thickening or significant pericolonic fat
stranding.

Vascular/Lymphatic: Normal caliber abdominal aorta. Patent portal,
splenic, hepatic and renal veins. No pathologically enlarged lymph
nodes in the abdomen or pelvis.

Reproductive: Status post hysterectomy, with no abnormal findings at
the vaginal cuff. No right adnexal mass. Left adnexal 3.8 cm simple
cyst (series 2/image 111). No follow-up imaging recommended. Note:
This recommendation does not apply to premenarchal patients and to
those with increased risk (genetic, family history, elevated tumor
markers or other high-risk factors) of ovarian cancer. Reference:
JACR [DATE]):[id number]

Other: No pneumoperitoneum, ascites or focal fluid collection.

Musculoskeletal: Widespread scattered lytic osseous lesions to
lumbar vertebral bodies, most prominent at L3 with mild pathologic
L2 vertebral compression fracture. Scattered lytic osseous lesions
in the upper sacrum and left greater than right medial iliac bones
bilaterally. Moderate degenerative disc disease in the lower lumbar
spine.
IMPRESSION: 1. Widespread metastatic disease to the lungs, bones and liver as
detailed, probably due to recurrent breast cancer. No definite
separate potential primary neoplasm identified.
2. Mild pathologic T9, T11, T12 and L2 vertebral compression
fractures.
3. Minimal sigmoid diverticulosis.

## 2020-11-30 IMAGING — MR MR TOTAL SPINE METS SCREENING
20 of 31 series · 22 of 48 positions shown · IV contrast (agent unspecified)
Comparison: None.
COMPARISON: None.

Addendum:
CLINICAL DATA: Staging of unknown primary cancer

EXAM:
MRI TOTAL SPINE WITHOUT AND WITH CONTRAST
TECHNIQUE: Multisequence MR imaging of the spine from the cervical spine to the
sacrum was performed prior to and following IV contrast
administration for evaluation of spinal metastatic disease.

[Series 16: T1 · sagittal · 4.0mm · 1.15mm/px · 1 of 11 slices shown (1 of 6)]
[im 1/11]
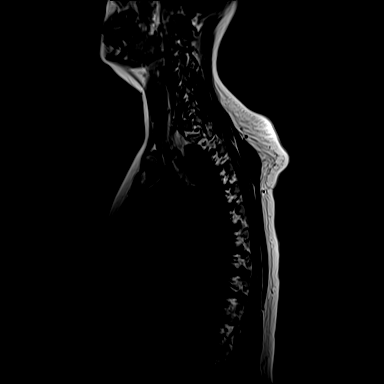

[Series 17: T1 · sagittal · 4.0mm · 1.15mm/px · 1 of 11 slices shown (2 of 6)]
[im 1/11]
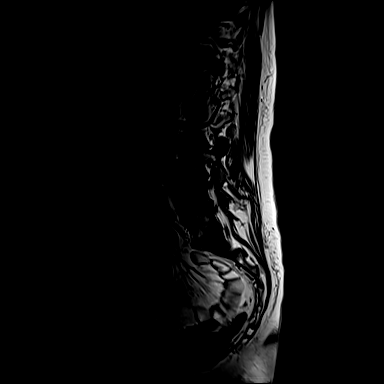

[Series 18: T1 · sagittal · 4.0mm · 1.19mm/px · 1 of 19 slices shown (3 of 6)]
[im 1/19]
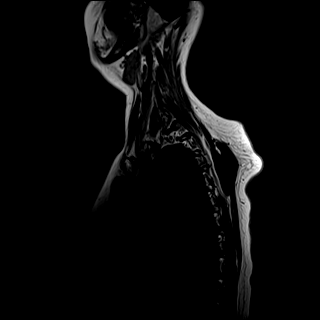

[Series 19: T1 · sagittal · 4.0mm · 1.19mm/px · 1 of 20 slices shown (4 of 6)]
[im 1/20]
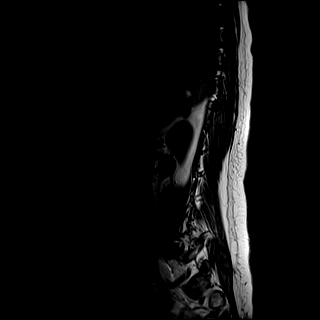

[Series 20: T1 · sagittal · 4.0mm · 1.19mm/px · 1 of 19 slices shown (5 of 6)]
[im 1/19]
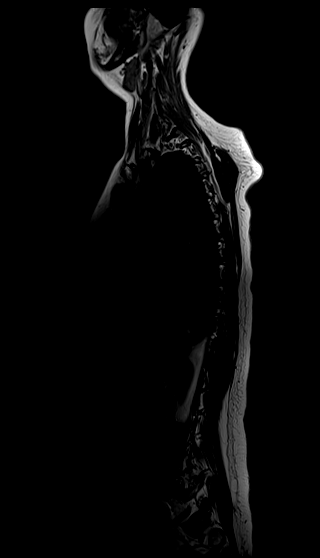

[Series 21: T1 · sagittal · 4.0mm · 1.19mm/px · 1 of 19 slices shown (6 of 6)]
[im 1/19]
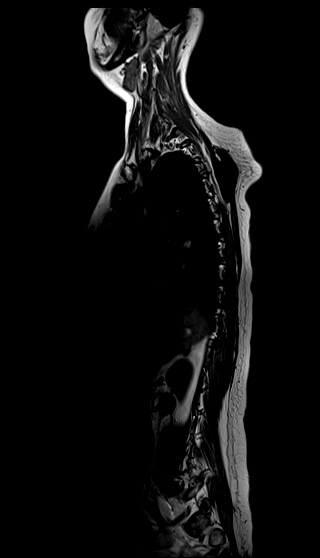

[Series 22: STIR · sagittal · 4.0mm · 1.19mm/px · 1 of 19 slices shown (1 of 2)]
[im 1/19]
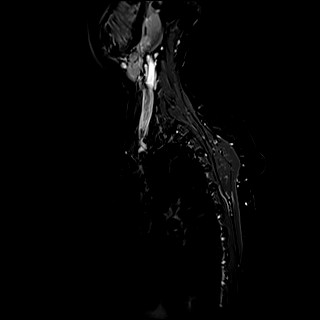

[Series 23: STIR · sagittal · 4.0mm · 1.19mm/px · 1 of 20 slices shown (2 of 2)]
[im 1/20]
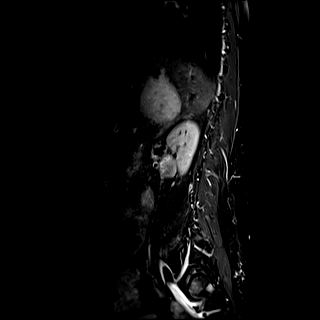

[Series 24: upper lower sag · sagittal · 4.4mm · 1.19mm/px · 1 of 19 slices shown (1 of 2)]
[im 1/19]
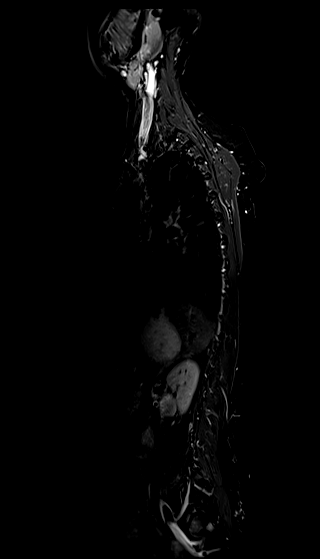

[Series 25: upper lower sag · sagittal · 4.4mm · 1.19mm/px · 1 of 19 slices shown (2 of 2)]
[im 1/19]
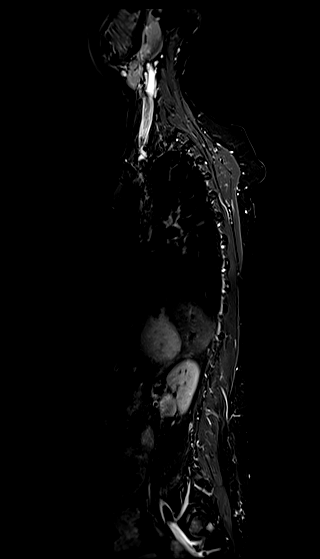

[Series 26: T1 fat-sat post-contrast · sagittal · 4.0mm · 1.19mm/px · 1 of 19 slices shown (1 of 2)]
[im 1/19]
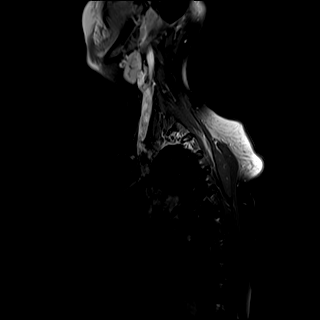

[Series 27: T1 fat-sat post-contrast · sagittal · 4.0mm · 1.19mm/px · 1 of 20 slices shown (2 of 2)]
[im 1/20]
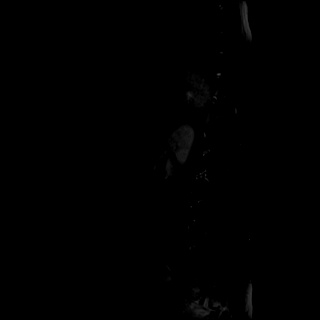

[Series 28: T1 fat-sat · sagittal · 4.0mm · 1.19mm/px · 1 of 19 slices shown (1 of 2)]
[im 1/19]
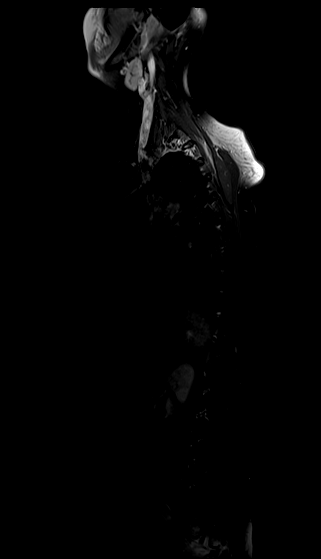

[Series 29: T1 fat-sat · sagittal · 4.0mm · 1.19mm/px · 1 of 19 slices shown (2 of 2)]
[im 1/19]
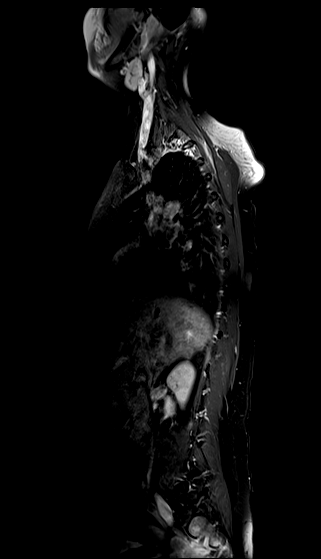

[Series 30: T2 post-contrast · sagittal · 4.0mm · 0.99mm/px · 1 of 19 slices shown (1 of 5)]
[im 1/19]
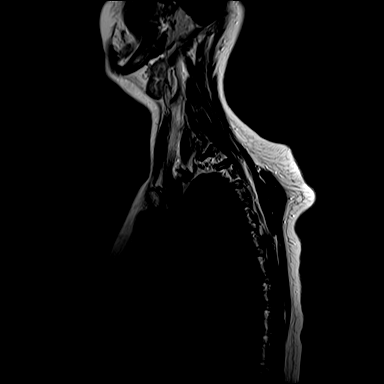

[Series 31: T2 post-contrast · sagittal · 4.0mm · 0.99mm/px · 1 of 20 slices shown (2 of 5)]
[im 1/20]
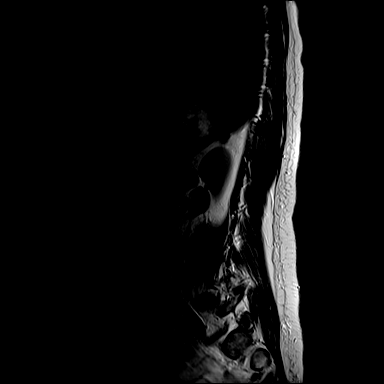

[Series 32: T2 post-contrast · sagittal · 4.0mm · 0.99mm/px · 1 of 19 slices shown (3 of 5)]
[im 1/19]
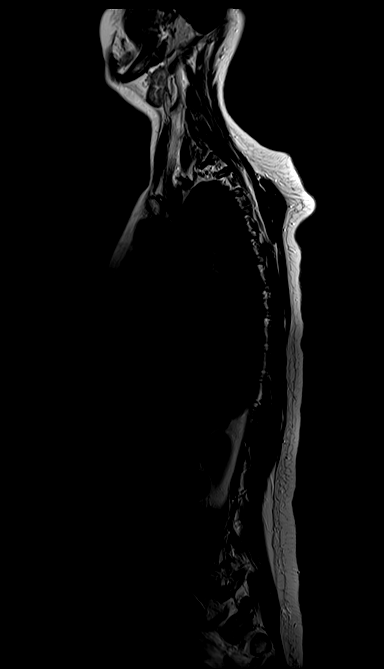

[Series 33: T2 post-contrast · sagittal · 4.0mm · 0.99mm/px · 1 of 19 slices shown (4 of 5)]
[im 1/19]
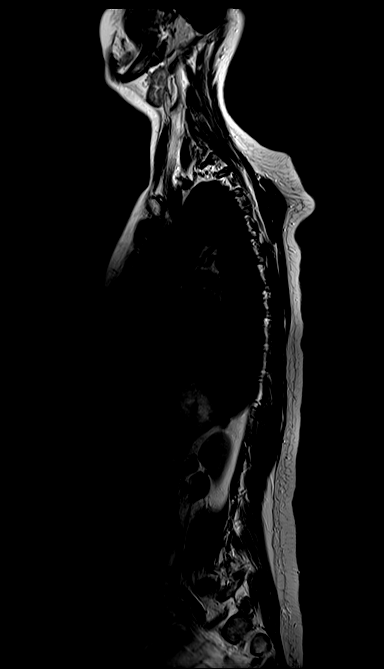

[Series 34: T1 post-contrast · axial · 4.0mm · 0.62mm/px · z∈[-499,-306]mm · 2 of 40 slices shown]
[im 1/40]
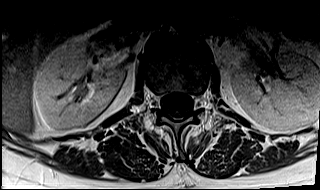
[im 40/40]
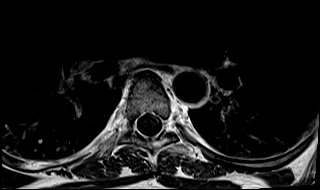

[Series 35: T2 post-contrast · axial · 4.0mm · 0.62mm/px · z∈[-499,-306]mm · 2 of 40 slices shown (5 of 5)]
[im 1/40]
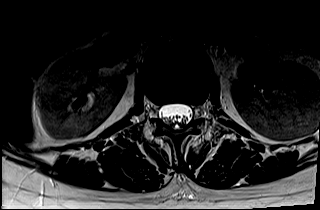
[im 40/40]
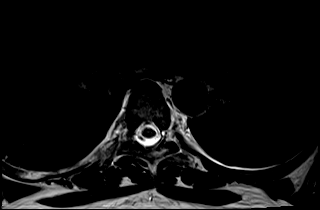

[22 of 48 positions shown; findings below may reference images not displayed]

FINDINGS: There are numerous metastatic lesions throughout the spine. There
are lesions at most levels, but the largest lesions are at T9, T10,
T11, T12, L2, L3 and S1. There is no compression fracture. There is
no abnormal epidural contrast enhancement. Spinal cord parenchyma is
normal.

Localizer images show multiple lesions within the liver. There is a
nodular mass at the right lung base.
IMPRESSION: 1. Numerous metastatic lesions of the cervical, thoracic and lumbar
spine. The greatest burden of lesions are at the T9 level and below,
but there are lesions as high as C3.
2. No pathologic fracture.
3. No epidural mass.
4. Likely metastatic lesions within the liver and at the right lung
base, better evaluated on earlier CT.

ADDENDUM:
Further review of this study with comparison to the CT of the chest,
abdomen and pelvis shows pathologic fractures at T9 and T12. At T9,
there is approximately 30 percent central height loss with violation
of the superior endplate. No retropulsion. At T12, there is
depression of the superior endplate measuring 6 mm. There is no
retropulsion. Any edema is difficult to separate from the abnormal
metastatic bone marrow.

At L2, there is a defect in the inferior endplate that is equivocal
but could indicate a minimally displaced pathologic endplate
fracture. There is mild height loss at T11 without a clear fracture.

*** End of Addendum ***
FINDINGS: There are numerous metastatic lesions throughout the spine. There
are lesions at most levels, but the largest lesions are at T9, T10,
T11, T12, L2, L3 and S1. There is no compression fracture. There is
no abnormal epidural contrast enhancement. Spinal cord parenchyma is
normal.

Localizer images show multiple lesions within the liver. There is a
nodular mass at the right lung base.
IMPRESSION: 1. Numerous metastatic lesions of the cervical, thoracic and lumbar
spine. The greatest burden of lesions are at the T9 level and below,
but there are lesions as high as C3.
2. No pathologic fracture.
3. No epidural mass.
4. Likely metastatic lesions within the liver and at the right lung
base, better evaluated on earlier CT.

## 2020-11-30 MED ORDER — TRAMADOL HCL 50 MG PO TABS
50.0000 mg | ORAL_TABLET | Freq: Four times a day (QID) | ORAL | Status: DC | PRN
  Administered 2020-11-30 – 2020-12-03 (×8): 50 mg via ORAL
  Filled 2020-11-30 (×8): qty 1

## 2020-11-30 MED ORDER — IOHEXOL 9 MG/ML PO SOLN
ORAL | Status: AC
  Filled 2020-11-30: qty 1000

## 2020-11-30 MED ORDER — GADOBUTROL 1 MMOL/ML IV SOLN
8.0000 mL | Freq: Once | INTRAVENOUS | Status: AC | PRN
  Administered 2020-11-30: 8 mL via INTRAVENOUS

## 2020-11-30 MED ORDER — ENOXAPARIN SODIUM 40 MG/0.4ML IJ SOSY
40.0000 mg | PREFILLED_SYRINGE | INTRAMUSCULAR | Status: DC
  Administered 2020-11-30 – 2020-12-04 (×4): 40 mg via SUBCUTANEOUS
  Filled 2020-11-30 (×4): qty 0.4

## 2020-11-30 MED ORDER — SODIUM CHLORIDE 0.9 % IV BOLUS
1000.0000 mL | Freq: Once | INTRAVENOUS | Status: AC
  Administered 2020-11-30: 1000 mL via INTRAVENOUS

## 2020-11-30 MED ORDER — TIZANIDINE HCL 4 MG PO TABS
4.0000 mg | ORAL_TABLET | Freq: Three times a day (TID) | ORAL | Status: DC | PRN
  Administered 2020-11-30 – 2020-12-10 (×10): 4 mg via ORAL
  Filled 2020-11-30 (×11): qty 1

## 2020-11-30 MED ORDER — DEXAMETHASONE 4 MG PO TABS
4.0000 mg | ORAL_TABLET | Freq: Two times a day (BID) | ORAL | Status: DC
  Administered 2020-12-01 – 2020-12-10 (×17): 4 mg via ORAL
  Filled 2020-11-30 (×18): qty 1

## 2020-11-30 MED ORDER — ACETAMINOPHEN 650 MG RE SUPP
650.0000 mg | Freq: Four times a day (QID) | RECTAL | Status: DC | PRN
Start: 2020-11-30 — End: 2020-12-10

## 2020-11-30 MED ORDER — ACETAMINOPHEN 325 MG PO TABS
650.0000 mg | ORAL_TABLET | Freq: Four times a day (QID) | ORAL | Status: DC | PRN
  Administered 2020-12-06 – 2020-12-07 (×2): 650 mg via ORAL
  Filled 2020-11-30 (×2): qty 2

## 2020-11-30 MED ORDER — POLYETHYLENE GLYCOL 3350 17 G PO PACK: 17.0000 g | PACK | Freq: Every day | ORAL | Status: DC | PRN

## 2020-11-30 MED ORDER — SODIUM CHLORIDE (PF) 0.9 % IJ SOLN
INTRAMUSCULAR | Status: AC
  Filled 2020-11-30: qty 50

## 2020-11-30 MED ORDER — TAMOXIFEN CITRATE 10 MG PO TABS
20.0000 mg | ORAL_TABLET | Freq: Every day | ORAL | Status: DC
  Administered 2020-11-30 – 2020-12-01 (×2): 20 mg via ORAL
  Filled 2020-11-30 (×3): qty 2

## 2020-11-30 MED ORDER — IOHEXOL 300 MG/ML  SOLN
75.0000 mL | Freq: Once | INTRAMUSCULAR | Status: AC | PRN
  Administered 2020-11-30: 75 mL via INTRAVENOUS

## 2020-11-30 MED ORDER — IOHEXOL 9 MG/ML PO SOLN
500.0000 mL | ORAL | Status: AC
  Administered 2020-11-30: 500 mL via ORAL

## 2020-11-30 MED ORDER — MORPHINE SULFATE (PF) 2 MG/ML IV SOLN
2.0000 mg | INTRAVENOUS | Status: DC | PRN
  Administered 2020-12-01 (×2): 2 mg via INTRAVENOUS
  Filled 2020-11-30 (×2): qty 1

## 2020-11-30 NOTE — ED Triage Notes (Addendum)
Hx of sciatica, saw chiropractor in early march ; intermittent back pain to R flank/mid back for 3-4 weeks since adjustment at chiropractor. Pain now radiating to groin. Using tizanidine , flomax , and tramadol without much relief.

## 2020-11-30 NOTE — H&P (Addendum)
History and Physical        Hospital Admission Note Date: 11/30/2020  Patient name: Monica [name] Medical record number: [id number] Date of birth: [birth year] Age: 50 y.o. Gender: female  PCP: [name], PA-C    Chief Complaint    Chief Complaint  Patient presents with  . Back Pain  . 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 Jana Hakim) who presented to the ED with right-sided back and flank pain for 3 months which acutely worsened last night.  In February she started exercising again and then began having some back pain, which she sought out care by a chiropractor for around that time. She continued to have intermittent muscle spasms in her right mid back. States that for the past week she had right flank pain with radiation to her groin but no radiation down her leg.  She went to a walk-in clinic yesterday and had a urine sample that showed trace blood without signs of UTI and was prescribed Flomax, tramadol, Toradol on tizanidine for concern of a kidney stone and was finally able to have some pain relief.  Unfortunately, in the middle the night she attempted to roll over and had acutely worsened pain and was unable to ambulate due to the pain. She had to crawl to use the bathroom. She called her daughter who came over to give her pain medications which did help but because she had a kidney stone that worsened she decided to come to the ED for eval.  Admits to night sweats which is not new.  Denies any fall or trauma, denies urinary or bowel complaints or any neurologic changes. She now has to ambulate with a cane due to the pain. Otherwise denies any complaints.   ED Course: Afebrile,  hemodynamically stable, on room air. Notable Labs: UA with trace leukocytes and rare bacteria, 6-10 WBC.  Otherwise labs unremarkable. Notable Imaging: CT renal stone study- findings concerning for widespread metastasis: Diffuse bony metastatic disease with lytic features and pathologic fractures of the thoracic and lumbar spine and pelvis; potential soft tissue extension into the central canal at T10 level, spiculated nodules in the chest, widespread metastatic disease in the liver, large hepatic masses (at least 10 total lesions), left adnexal cyst, pathologic fracture of the superior endplate on the right at L4, nonobstructive left nephrolithiasis. Patient received 1 L NS bolus.    Vitals:   11/30/20 1318 11/30/20 1415  BP: 118/82 140/84  Pulse: (!) 58 93  Resp: 18 18  Temp:    SpO2: 100% 100%     Review of Systems:  Review of Systems  All other systems reviewed and are negative.   Medical/Social/Family History   Past Medical History: Past Medical History:  Diagnosis Date  . Arthritis    lower back  . Cancer [facility])    right breast cancer  . Family history of breast cancer   . Personal history of chemotherapy   . Personal history of radiation therapy     Past Surgical History:  Procedure Laterality Date  . ABDOMINAL HYSTERECTOMY    . BREAST LUMPECTOMY Right   . BREAST LUMPECTOMY WITH RADIOACTIVE SEED AND SENTINEL LYMPH NODE BIOPSY Right 02/16/2018   Procedure: RIGHT BREAST LUMPECTOMY WITH RADIOACTIVE SEED AND SENTINEL LYMPH NODE BIOPSY;  Surgeon: [name], MD;  Location: [place];  Service: General;  Laterality: Right;  . HYMENECTOMY    . PORTACATH PLACEMENT Right 02/16/2018   Procedure: INSERTION PORT-A-CATH;  Surgeon: [name], MD;  Location: [place];  Service: General;  Laterality: Right;  . RE-EXCISION OF BREAST LUMPECTOMY Right 02/22/2018   Procedure: RE-EXCISION OF RIGHT  BREAST LUMPECTOMY;  Surgeon: [name], MD;   Location: [place];  Service: General;  Laterality: Right;  . REPAIR VAGINAL CUFF N/A 02/07/2017   Procedure: REPAIR VAGINAL CUFF;  Surgeon: Ena Dawley, MD;  Location: [facility];  Service: Gynecology;  Laterality: N/A;  . ROBOTIC ASSISTED TOTAL HYSTERECTOMY WITH SALPINGECTOMY Left 01/20/2017   Procedure: ROBOTIC ASSISTED TOTAL HYSTERECTOMY WITH SALPINGECTOMY;  Surgeon: [name], MD;  Location: [facility];  Service: Gynecology;  Laterality: Left;    Medications: Prior to Admission medications   Medication Sig Start Date End Date Taking? Authorizing Provider  Acetaminophen (TYLENOL PO) Take by mouth as needed. 2 tabs as needed for aches and pains     [provider]  cholecalciferol (VITAMIN D3) 25 MCG (1000 UNIT) tablet Take 1 tablet (1,000 Units total) by mouth daily. 06/20/20   Magrinat, [name], MD  ibuprofen (ADVIL,MOTRIN) 800 MG tablet Take 1 tablet (800 mg total) by mouth every 8 (eight) hours as needed. Patient taking differently: Take 800 mg by mouth as needed.  02/16/18   Cornett, [name], MD  loratadine (CLARITIN) 10 MG tablet Take 10 mg by mouth daily as needed.     [provider]  NAPROXEN PO Take by mouth as needed. 1 tab as needed for aches and pain    [provider]  Probiotic Product (PROBIOTIC PO) Take by mouth daily.    [provider]  tamoxifen (NOLVADEX) 20 MG tablet Take 1 tablet (20 mg total) by mouth daily. 06/20/20   Magrinat, [name], MD  tiZANidine (ZANAFLEX) 4 MG tablet Take 4 mg by mouth 3 (three) times daily as needed for pain. 11/22/20   [provider]    Allergies:  No Known Allergies  Social History:  reports that she has never smoked. She has never used smokeless tobacco. She reports current alcohol use. She reports that she does not use drugs.  Family History: Family History  Problem Relation Age of Onset  . Lung cancer Maternal Grandfather   . Esophageal cancer Paternal Grandfather 45  .  Breast cancer Cousin 31     Objective   Physical Exam: Blood pressure 140/84, pulse 93, temperature 98.8 F (37.1 C), temperature source Oral, resp. rate 18, height 5\' 6"  (1.676 m), weight 82.1 kg, last menstrual period 12/21/2016, SpO2 100 %.  Physical Exam Vitals and nursing note reviewed.  Constitutional:      Appearance: Normal appearance.  HENT:     Head: Normocephalic and atraumatic.  Eyes:     Conjunctiva/sclera: Conjunctivae normal.  Cardiovascular:     Rate and Rhythm: Normal rate and regular rhythm.  Pulmonary:     Effort: Pulmonary effort is normal.     Breath sounds: Normal breath sounds.  Abdominal:     General: Abdomen is flat.     Palpations: Abdomen is soft.  Musculoskeletal:  General: No swelling.     Comments: Midline tenderness of the lumbar vertebra Lumbar musculature tender to palpation  Skin:    Coloration: Skin is not jaundiced or pale.  Neurological:     Mental Status: She is alert.     Comments: Negative straight leg raise test bilaterally but severe pain with active right hip flexion  Psychiatric:        Mood and Affect: Mood normal.        Behavior: Behavior normal.     LABS on Admission: I have personally reviewed all the labs and imaging below    Basic Metabolic Panel: Recent Labs  Lab 11/30/20 1153  NA 137  K 3.9  CL 105  CO2 25  GLUCOSE 94  BUN 16  CREATININE 0.80  CALCIUM 9.6   Liver Function Tests: Recent Labs  Lab 11/30/20 1153  AST 39  ALT 34  ALKPHOS 85  BILITOT 0.4  PROT 6.9  ALBUMIN 3.5   No results for input(s): LIPASE, AMYLASE in the last 168 hours. No results for input(s): AMMONIA in the last 168 hours. CBC: Recent Labs  Lab 11/30/20 1153  WBC 8.7  NEUTROABS 5.9  HGB 11.9*  HCT 36.6  MCV 93.1  PLT 253   Cardiac Enzymes: No results for input(s): CKTOTAL, CKMB, CKMBINDEX, TROPONINI in the last 168 hours. BNP: Invalid input(s): POCBNP CBG: No results for input(s): GLUCAP in the last 168  hours.  Radiological Exams on Admission:  CT Renal Stone Study  Addendum Date: 11/30/2020   ADDENDUM REPORT: 11/30/2020 13:49 ADDENDUM: These results were called by telephone at the time of interpretation on 11/30/2020 at 1:48 pm to provider Sherol Dade , who verbally acknowledged these results. Electronically Signed   By: Zetta Bills M.D.   On: 11/30/2020 13:49   Result Date: 11/30/2020 CLINICAL DATA:  RIGHT flank pain, kidney stone suspected. EXAM: CT ABDOMEN AND PELVIS WITHOUT CONTRAST TECHNIQUE: Multidetector CT imaging of the abdomen and pelvis was performed following the standard protocol without IV contrast. COMPARISON:  Only chest x-rays are available for comparison. FINDINGS: Lower chest: Spiculated nodular area in the RIGHT lower lobe (image 9/4) 1.6 x 1.5 cm. Septal thickening extending to the periphery of the RIGHT chest in the RIGHT middle lobe. Additional spiculated nodule 12 mm (image 44/4) RIGHT lower lobe. Smaller nodule at the periphery measuring 3-4 mm. No effusion. No consolidation. Hepatobiliary: Large hepatic masses. Largest in the RIGHT lobe (image 25/2 5.8 x 5.6 cm. Central hepatic mass (image 32/2 4.5 x 3.5 cm. LEFT lobe lesion (image 42/22.4 cm. Additional lesions scattered throughout the liver, at least 10 total lesions. No pericholecystic stranding. Pancreas: Pancreas with normal contour.  No sign of inflammation. Spleen: Spleen normal in size and contour. Adrenals/Urinary Tract: Adrenal glands are normal. Nephrolithiasis in the lower pole of the LEFT kidney 3 mm (image 50/2) smooth renal contours. No hydronephrosis. No perinephric stranding. Urinary bladder under distended. Stomach/Bowel: Stomach is unremarkable. Small bowel normal caliber. Appendix is normal. No acute colonic process. Vascular/Lymphatic: Normal caliber of the abdominal aorta and IVC. There is no gastrohepatic or hepatoduodenal ligament lymphadenopathy. No retroperitoneal or mesenteric lymphadenopathy.  No pelvic sidewall lymphadenopathy. Reproductive: Post hysterectomy. Cystic area in the LEFT adnexa measuring 4.2 x 3.2 cm in axial dimension and 4.0 cm in greatest craniocaudal extent. Other: No ascites.  No free air. Musculoskeletal: Diffuse bony metastatic disease with lytic features. For instance, in the LEFT posterior iliac with destructive features on image 65 of series 2  is a 3.1 cm lytic focus.a this is associated with pathologic fracture of the posterior LEFT iliac. Lytic lesions are seen to varying degrees throughout the thoracic and the lumbar spine nearly every level of the spine is involved. Also with a large sacral metastasis on image 63 of series 2 measuring 3.6 x 3.6 cm. Mild loss of height due to pathologic fracture at the T9 vertebral level approximately 10-15% loss of height. Lytic metastases at the remaining levels of the thoracic spine with mild loss of height at T12 as well likely due to pathologic endplate fracture. At the T10 level there is suggestion of soft tissue extending posteriorly into the central canal, difficult to determine on noncontrast evaluation but with posterior cortical destruction and potential extension is much as 7 mm into the central canal posterior to the T10 level. Multilevel metastatic disease in the lumbar spine as well largest lesion approximately 1.5 cm in the L3 vertebral level. Pathologic fracture of the superior endplate on the RIGHT at L4 with endplate concavity, mild endplate concavity at this level. IMPRESSION: 1. Diffuse bony metastatic disease with lytic features and associated pathologic fractures in the thoracic, lumbar spine and in the pelvis. 2. Potential soft tissue extension into the central canal at the T10 level. Correlate with symptoms and suggest MRI for further assessment as warranted. 3. Spiculated nodules in the chest with morphologic features that would be more suggestive of bronchogenic neoplasm but given multiplicity could also be seen with  metastatic disease. 4. 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 5. Large hepatic masses, at least 10 total lesions. Findings are concerning for metastatic disease. 6. Cystic area in the LEFT adnexa measuring 4.2 x 3.2 cm in axial dimension and 4.0 cm in greatest craniocaudal extent. This is indeterminate, standard follow-up recommendations may not apply in this instance. Normally no follow-up would be recommended, however in the current context could consider follow-up sonogram for further evaluation on a nonemergent basis. 7. Pathologic fracture of the superior endplate on the RIGHT at L4 with endplate concavity, mild endplate concavity at this level. 8. Nonobstructive LEFT nephrolithiasis. 9. Aortic atherosclerosis. Electronically Signed: By: Zetta Bills M.D. On: 11/30/2020 13:16      EKG: Not done   A & P   Active Problems:   * No active hospital problems. *   1. Acute on chronic back pain secondary to newly found diffuse bony metastatic disease and L4 pathologic fracture with history of right sided breast cancer a. Findings not suggestive of cauda equina syndrome b. On tamoxifen c. Per Oncology: MRI Brain, Cervical and thoracolumbar spine, CT chest/abd/pelvis for cancer staging. Also dexamethasone 4 mg with breakfast and lunch. Tumor markers ordered d. IR consult for possible vertebroplasty and biopsy e. Will continue her home tramadol and tizanadine as she had reasonable pain improvement with this and add on PRN morphine for severe pain f. Palliative care consult to further assist with cancer related pain and further support at DC g. PT eval  2. Widespread lesions concerning for newly found metastases with history of triple positive invasive ductal carcinoma of the right breast a. As above: diffuse bony metastatic disease with lytic features and pathologic fractures of the thoracic and lumbar spine and pelvis; potential soft tissue extension into the central canal at T10 level,  spiculated nodules in the chest, widespread metastatic disease in the liver, large hepatic masses (at least 10 total lesions), left adnexal cyst, pathologic fracture of the superior endplate on the right at L4 b. Oncology  and palliative consulted    DVT prophylaxis: lovenox   Code Status: Prior  Diet: regular Family Communication: Admission, patients condition and plan of care including tests being ordered have been discussed with the patient who indicates understanding and agrees with the plan and Code Status. Patient's boyfriend at bedside was updated  Disposition Plan: The appropriate patient status for this patient is INPATIENT. Inpatient status is judged to be reasonable and necessary in order to provide the required intensity of service to ensure the patient's safety. The patient's presenting symptoms, physical exam findings, and initial radiographic and laboratory data in the context of their chronic comorbidities is felt to place them at high risk for further clinical deterioration. Furthermore, it is not anticipated that the patient will be medically stable for discharge from the hospital within 2 midnights of admission. The following factors support the patient status of inpatient.   " The patient's presenting symptoms include back pain. " The worrisome physical exam findings include back pain. " The initial radiographic and laboratory data are worrisome because of as above. " The chronic co-morbidities include as above.   * I certify that at the point of admission it is my clinical judgment that the patient will require inpatient hospital care spanning beyond 2 midnights from the point of admission due to high intensity of service, high risk for further deterioration and high frequency of surveillance required.*      The medical decision making on this patient was of high complexity and the patient is at high risk for clinical deterioration, therefore this is a level 3   admission.  Consultants  . Oncology . IR . palliative  Procedures  . none  Time Spent on Admission: 73 minutes    Harold Hedge, DO [facility]  11/30/2020, 3:04 PM

## 2020-11-30 NOTE — Progress Notes (Addendum)
Oncology short note  Patient is a wonderful 50 year old lady with prior history of stage Ib triple positive breast cancer treated by Dr. Jana Hakim with chemotherapy plus HER2 directed therapy and adjuvant HER2 therapy in 2019/20.  She has been on tamoxifen for adjuvant endocrine treatment which she has been taking compliantly.  Presents with new widely metastatic malignancy with unknown primary.  Primary is likely breast though she does have couple of spiculated lesions in the lung and had a left adnexal cyst which could suggest other primary sites. Patient has significant burden of bone metastases in her spine with few areas of pathologic fracture. Also has concern for potential soft tissue extension into the central canal at T10.  Recommendations -Would recommend CT chest abdomen pelvis with IV contrast for initial staging of metastatic malignancy likely with breast primary. -MRI cervical lumbar and thoracic spine to evaluate for actionable disease with regards to possible impending cord compression and to define areas that might need to be addressed with radiation or vertebroplasty for pain management. -Tumor markers including CA 125, CA 27 29 and CA 15-3 have been ordered -Please admit to hospital medicine for pain management and as primary inpatient service. -Would recommend consideration of dexamethasone 4 mg with breakfast and lunch for pain control in addition to other pain medications. -IR consult forbx of liver lesion for tissue diagnosis and molecular testing.  Full consult note to follow Dr. Jana Hakim will follow up on Monday.  Sullivan Lone MD MS Hematology/Oncology Physician [place]

## 2020-11-30 NOTE — ED Provider Notes (Signed)
[facility] DEPT Provider Note   CSN: [id number] Arrival date & time: 11/30/20  1028     History Chief Complaint  Patient presents with  . Back Pain  . Flank Pain    Monica [name] is a 50 y.o. female with medical history significant for right breast cancer, arthritis.  HPI Patient presents to emergency room today with chief complaint of right-sided back and flank pain x4 weeks.  Patient states she has history of right-sided sciatica that was treated by the chiropractor with adjustments x2 months ago.  Ever since then she has continued to have intermittent muscle spasms she feels like located in that same area of her right mid back.  She states for the last week she has had right flank pain that started radiating to her groin.  She went to walk-in clinic at PCP office and was seen yesterday.  She said she had a urine sample that showed trace blood without signs of urinary tract infection.  She was prescribed Flomax, tramadol, Toradol and tizanidine.  She states after taking those yesterday afternoon she was able to have some pain relief and able to go to the movies.  She states she went to bed feeling okay however in the middle of the night attempted to roll over and had worsening pain.  She was unable to get up and ambulate, she states she had to crawl to use the bathroom.  She called her daughter who was able to come over and get her another dose of medications.  She states since taking those she has been able to move however still having severe pain.  She denies any history of kidney stones.  She sexually active with 1 female partner.  No history of STIs.  No concern about them today.  She states she was given a clean bill of health from oncologist x5 months ago.  She denies any recent weight loss.  She does have night sweats, this is not new.  She denies any fever, chills, nausea, emesis, numbness, weakness, tingling, gross hematuria, dysuria or urinary frequency.   Denies any recent fall or trauma.  Patient does admit to history of bulging disks in her back found on MRI years ago.  She has not been seen by an orthopedist or neurosurgeon for this and recommended.    Past Medical History:  Diagnosis Date  . Arthritis    lower back  . Cancer [facility])    right breast cancer  . Family history of breast cancer   . Personal history of chemotherapy   . Personal history of radiation therapy     Patient Active Problem List   Diagnosis Date Noted  . Port-A-Cath in place 03/08/2018  . Genetic testing 02/03/2018  . Family history of breast cancer 01/12/2018  . Malignant neoplasm of upper-inner quadrant of right breast in female, estrogen receptor positive ([place]) 01/05/2018  . Vaginal bleeding 02/07/2017  . Anemia 02/07/2017  . Orthostasis 02/07/2017  . S/P laparoscopic hysterectomy 01/20/2017    Past Surgical History:  Procedure Laterality Date  . ABDOMINAL HYSTERECTOMY    . BREAST LUMPECTOMY Right   . BREAST LUMPECTOMY WITH RADIOACTIVE SEED AND SENTINEL LYMPH NODE BIOPSY Right 02/16/2018   Procedure: RIGHT BREAST LUMPECTOMY WITH RADIOACTIVE SEED AND SENTINEL LYMPH NODE BIOPSY;  Surgeon: [name], MD;  Location: [facility];  Service: General;  Laterality: Right;  . HYMENECTOMY    . PORTACATH PLACEMENT Right 02/16/2018   Procedure: INSERTION PORT-A-CATH;  Surgeon: Cornett,  [name], MD;  Location: [facility];  Service: General;  Laterality: Right;  . RE-EXCISION OF BREAST LUMPECTOMY Right 02/22/2018   Procedure: RE-EXCISION OF RIGHT  BREAST LUMPECTOMY;  Surgeon: [name], MD;  Location: [facility];  Service: General;  Laterality: Right;  . REPAIR VAGINAL CUFF N/A 02/07/2017   Procedure: REPAIR VAGINAL CUFF;  Surgeon: Ena Dawley, MD;  Location: [facility];  Service: Gynecology;  Laterality: N/A;  . ROBOTIC ASSISTED TOTAL HYSTERECTOMY WITH SALPINGECTOMY Left 01/20/2017   Procedure: ROBOTIC ASSISTED TOTAL  HYSTERECTOMY WITH SALPINGECTOMY;  Surgeon: [name], MD;  Location: [facility];  Service: Gynecology;  Laterality: Left;     OB History    Gravida  2   Para  2   Term      Preterm      AB      Living        SAB      IAB      Ectopic      Multiple      Live Births              Family History  Problem Relation Age of Onset  . Lung cancer Maternal Grandfather   . Esophageal cancer Paternal Grandfather 59  . Breast cancer Cousin 9    Social History   Tobacco Use  . Smoking status: Never Smoker  . Smokeless tobacco: Never Used  Vaping Use  . Vaping Use: Some days  Substance Use Topics  . Alcohol use: Yes    Comment: occ  . Drug use: No    Home Medications Prior to Admission medications   Medication Sig Start Date End Date Taking? Authorizing Provider  Acetaminophen (TYLENOL PO) Take by mouth as needed. 2 tabs as needed for aches and pains     [provider]  cholecalciferol (VITAMIN D3) 25 MCG (1000 UNIT) tablet Take 1 tablet (1,000 Units total) by mouth daily. 06/20/20   Magrinat, [name], MD  ibuprofen (ADVIL,MOTRIN) 800 MG tablet Take 1 tablet (800 mg total) by mouth every 8 (eight) hours as needed. Patient taking differently: Take 800 mg by mouth as needed.  02/16/18   Cornett, [name], MD  loratadine (CLARITIN) 10 MG tablet Take 10 mg by mouth daily as needed.     [provider]  NAPROXEN PO Take by mouth as needed. 1 tab as needed for aches and pain    [provider]  Probiotic Product (PROBIOTIC PO) Take by mouth daily.    [provider]  tamoxifen (NOLVADEX) 20 MG tablet Take 1 tablet (20 mg total) by mouth daily. 06/20/20   Magrinat, [name], MD  tiZANidine (ZANAFLEX) 4 MG tablet Take 4 mg by mouth 3 (three) times daily as needed for pain. 11/22/20   [provider]    Allergies    Patient has no known allergies.  Review of Systems   Review of Systems All other systems are reviewed and are negative  for acute change except as noted in the HPI.  Physical Exam Updated Vital Signs BP (!) 148/80 (BP Location: Left Arm)   Pulse 89   Temp 98.8 F (37.1 C) (Oral)   Resp 16   Ht 5\' 6"  (1.676 m)   Wt 82.1 kg   LMP 12/21/2016 (Exact Date)   SpO2 100%   BMI 29.21 kg/m   Physical Exam Vitals and nursing note reviewed.  Constitutional:      General: She is not in acute  distress.    Appearance: She is not ill-appearing.  HENT:     Head: Normocephalic and atraumatic.     Right Ear: Tympanic membrane and external ear normal.     Left Ear: Tympanic membrane and external ear normal.     Nose: Nose normal.     Mouth/Throat:     Mouth: Mucous membranes are moist.     Pharynx: Oropharynx is clear.  Eyes:     General: No scleral icterus.       Right eye: No discharge.        Left eye: No discharge.     Extraocular Movements: Extraocular movements intact.     Conjunctiva/sclera: Conjunctivae normal.     Pupils: Pupils are equal, round, and reactive to light.  Neck:     Vascular: No JVD.  Cardiovascular:     Rate and Rhythm: Normal rate and regular rhythm.     Pulses: Normal pulses.          Radial pulses are 2+ on the right side and 2+ on the left side.     Heart sounds: Normal heart sounds.  Pulmonary:     Comments: Lungs clear to auscultation in all fields. Symmetric chest rise. No wheezing, rales, or rhonchi. Abdominal:     Tenderness: There is right CVA tenderness.     Comments: Abdomen is soft, non-distended, and non-tender in all quadrants. No rigidity, no guarding. No peritoneal signs.  Musculoskeletal:     Cervical back: Normal range of motion.       Back:     Comments: Tender to palpation as depicted in image above. No midline step off or deformity. Positive straight leg raise test on the right.  Negative straight leg raises on the left.   No cervical, thoracic, or lumbar spinal tenderness to palpation. No paraspinal tenderness. No step offs, crepitus or deformity  palpated.   Skin:    General: Skin is warm and dry.     Capillary Refill: Capillary refill takes less than 2 seconds.  Neurological:     Mental Status: She is oriented to person, place, and time.     GCS: GCS eye subscore is 4. GCS verbal subscore is 5. GCS motor subscore is 6.     Comments: Fluent speech, no facial droop.  Sensation grossly intact to light touch in the lower extremities bilaterally. No saddle anesthesias. Strength 5/5 with flexion and extension at the bilateral hips, knees, and ankles.    Psychiatric:        Behavior: Behavior normal.     ED Results / Procedures / Treatments   Labs (all labs ordered are listed, but only abnormal results are displayed) Labs Reviewed  URINALYSIS, ROUTINE W REFLEX MICROSCOPIC - Abnormal; Notable for the following components:      Result Value   APPearance HAZY (*)    Leukocytes,Ua TRACE (*)    Bacteria, UA RARE (*)    All other components within normal limits  CBC WITH DIFFERENTIAL/PLATELET - Abnormal; Notable for the following components:   Hemoglobin 11.9 (*)    All other components within normal limits  RESP PANEL BY RT-PCR (FLU A&B, COVID) ARPGX2  COMPREHENSIVE METABOLIC PANEL  CANCER ANTIGEN 15-3  CA 125  CANCER ANTIGEN 27.29    EKG None  Radiology CT Renal Stone Study  Addendum Date: 11/30/2020   ADDENDUM REPORT: 11/30/2020 13:49 ADDENDUM: These results were called by telephone at the time of interpretation on 11/30/2020 at 1:48 pm to provider [facility]  [email or web address] , who verbally acknowledged these results. Electronically Signed   By: Zetta Bills M.D.   On: 11/30/2020 13:49   Result Date: 11/30/2020 CLINICAL DATA:  RIGHT flank pain, kidney stone suspected. EXAM: CT ABDOMEN AND PELVIS WITHOUT CONTRAST TECHNIQUE: Multidetector CT imaging of the abdomen and pelvis was performed following the standard protocol without IV contrast. COMPARISON:  Only chest x-rays are available for comparison. FINDINGS: Lower chest:  Spiculated nodular area in the RIGHT lower lobe (image 9/4) 1.6 x 1.5 cm. Septal thickening extending to the periphery of the RIGHT chest in the RIGHT middle lobe. Additional spiculated nodule 12 mm (image 44/4) RIGHT lower lobe. Smaller nodule at the periphery measuring 3-4 mm. No effusion. No consolidation. Hepatobiliary: Large hepatic masses. Largest in the RIGHT lobe (image 25/2 5.8 x 5.6 cm. Central hepatic mass (image 32/2 4.5 x 3.5 cm. LEFT lobe lesion (image 42/22.4 cm. Additional lesions scattered throughout the liver, at least 10 total lesions. No pericholecystic stranding. Pancreas: Pancreas with normal contour.  No sign of inflammation. Spleen: Spleen normal in size and contour. Adrenals/Urinary Tract: Adrenal glands are normal. Nephrolithiasis in the lower pole of the LEFT kidney 3 mm (image 50/2) smooth renal contours. No hydronephrosis. No perinephric stranding. Urinary bladder under distended. Stomach/Bowel: Stomach is unremarkable. Small bowel normal caliber. Appendix is normal. No acute colonic process. Vascular/Lymphatic: Normal caliber of the abdominal aorta and IVC. There is no gastrohepatic or hepatoduodenal ligament lymphadenopathy. No retroperitoneal or mesenteric lymphadenopathy. No pelvic sidewall lymphadenopathy. Reproductive: Post hysterectomy. Cystic area in the LEFT adnexa measuring 4.2 x 3.2 cm in axial dimension and 4.0 cm in greatest craniocaudal extent. Other: No ascites.  No free air. Musculoskeletal: Diffuse bony metastatic disease with lytic features. For instance, in the LEFT posterior iliac with destructive features on image 65 of series 2 is a 3.1 cm lytic focus.a this is associated with pathologic fracture of the posterior LEFT iliac. Lytic lesions are seen to varying degrees throughout the thoracic and the lumbar spine nearly every level of the spine is involved. Also with a large sacral metastasis on image 63 of series 2 measuring 3.6 x 3.6 cm. Mild loss of height due to  pathologic fracture at the T9 vertebral level approximately 10-15% loss of height. Lytic metastases at the remaining levels of the thoracic spine with mild loss of height at T12 as well likely due to pathologic endplate fracture. At the T10 level there is suggestion of soft tissue extending posteriorly into the central canal, difficult to determine on noncontrast evaluation but with posterior cortical destruction and potential extension is much as 7 mm into the central canal posterior to the T10 level. Multilevel metastatic disease in the lumbar spine as well largest lesion approximately 1.5 cm in the L3 vertebral level. Pathologic fracture of the superior endplate on the RIGHT at L4 with endplate concavity, mild endplate concavity at this level. IMPRESSION: 1. Diffuse bony metastatic disease with lytic features and associated pathologic fractures in the thoracic, lumbar spine and in the pelvis. 2. Potential soft tissue extension into the central canal at the T10 level. Correlate with symptoms and suggest MRI for further assessment as warranted. 3. Spiculated nodules in the chest with morphologic features that would be more suggestive of bronchogenic neoplasm but given multiplicity could also be seen with metastatic disease. 4. 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 5. Large hepatic masses, at least 10 total lesions. Findings are concerning for metastatic disease. 6. Cystic area in the LEFT adnexa measuring 4.2 x  3.2 cm in axial dimension and 4.0 cm in greatest craniocaudal extent. This is indeterminate, standard follow-up recommendations may not apply in this instance. Normally no follow-up would be recommended, however in the current context could consider follow-up sonogram for further evaluation on a nonemergent basis. 7. Pathologic fracture of the superior endplate on the RIGHT at L4 with endplate concavity, mild endplate concavity at this level. 8. Nonobstructive LEFT nephrolithiasis. 9. Aortic  atherosclerosis. Electronically Signed: By: Zetta Bills M.D. On: 11/30/2020 13:16    Procedures Procedures   Medications Ordered in ED Medications  sodium chloride 0.9 % bolus 1,000 mL (0 mLs Intravenous Stopped 11/30/20 1425)    ED Course  I have reviewed the triage vital signs and the nursing notes.  Pertinent labs & imaging results that were available during my care of the patient were reviewed by me and considered in my medical decision making (see chart for details).    MDM Rules/Calculators/A&P                          History provided by patient with additional history obtained from chart review.    Presenting with back and flank pain.  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  Patient is well-appearing, no acute distress.  She is having difficulty ambulating because of the severe pain.  On exam she has tenderness to palpation of thoracic and lumbar paraspinal areas.  No midline tenderness step-off or deformity.  No saddle anesthesia.  Sensation is intact to bilateral lower extremities. CT renal obtained as kidney stone in the differential diagnosis.  Also with her cancer history have to consider metastatic disease.  CBC and CMP overall unremarkable.  UA shows no RBCs or blood.  She does have trace leukocytes and 6-10 WBCs.  She declines need for any pain medicine here. CT scan is concerning for metastatic disease.  She has multiple lesions seen in lungs, liver, left adnexa.  She has multiple fractures in thoracic, lumbar and pelvis.  Likely pathologic.  Discussed with radiologist who is unable to tell which the primary is if this is a recurrence of the breast cancer or primary lung or adnexa. Consulted on-call oncologist Dr. Irene Limbo who viewed imaging and is recommending hospital admission for MRI of full spine, biopsies and possible IR kyphoplasty.  Oncology will see the patient in consult.  I have updated patient on plan of care, she and significant  other are agreeable with plan of care.  COVID test ordered. Spoke with Dr. Neysa Bonito with hospitalist service who agrees to assume care of patient and bring into the hospital for further evaluation and management.     Portions of this note were generated with [employer]. Dictation errors may occur despite best attempts at proofreading.  Final Clinical Impression(s) / ED Diagnoses Final diagnoses:  Metastatic malignant neoplasm, unspecified site [facility])    Rx / DC Orders ED Discharge Orders    None       Lewanda Rife 11/30/20 1510    Daleen Bo, MD 12/01/20 [phone]

## 2020-12-01 DIAGNOSIS — G893 Neoplasm related pain (acute) (chronic): Secondary | ICD-10-CM

## 2020-12-01 DIAGNOSIS — Z7189 Other specified counseling: Secondary | ICD-10-CM

## 2020-12-01 DIAGNOSIS — C787 Secondary malignant neoplasm of liver and intrahepatic bile duct: Secondary | ICD-10-CM

## 2020-12-01 DIAGNOSIS — R52 Pain, unspecified: Secondary | ICD-10-CM

## 2020-12-01 DIAGNOSIS — C7931 Secondary malignant neoplasm of brain: Secondary | ICD-10-CM

## 2020-12-01 DIAGNOSIS — C801 Malignant (primary) neoplasm, unspecified: Secondary | ICD-10-CM

## 2020-12-01 DIAGNOSIS — C7951 Secondary malignant neoplasm of bone: Secondary | ICD-10-CM

## 2020-12-01 DIAGNOSIS — C50911 Malignant neoplasm of unspecified site of right female breast: Secondary | ICD-10-CM

## 2020-12-01 DIAGNOSIS — C7989 Secondary malignant neoplasm of other specified sites: Secondary | ICD-10-CM

## 2020-12-01 DIAGNOSIS — Z515 Encounter for palliative care: Secondary | ICD-10-CM

## 2020-12-01 DIAGNOSIS — Z170 Estrogen receptor positive status [ER+]: Secondary | ICD-10-CM

## 2020-12-01 DIAGNOSIS — C7800 Secondary malignant neoplasm of unspecified lung: Secondary | ICD-10-CM

## 2020-12-01 DIAGNOSIS — C799 Secondary malignant neoplasm of unspecified site: Secondary | ICD-10-CM

## 2020-12-01 LAB — HIV ANTIBODY (ROUTINE TESTING W REFLEX): HIV Screen 4th Generation wRfx: NONREACTIVE

## 2020-12-01 LAB — BASIC METABOLIC PANEL
Anion gap: 5 (ref 5–15)
BUN: 11 mg/dL (ref 6–20)
CO2: 29 mmol/L (ref 22–32)
Calcium: 9.6 mg/dL (ref 8.9–10.3)
Chloride: 107 mmol/L (ref 98–111)
Creatinine, Ser: 0.74 mg/dL (ref 0.44–1.00)
GFR, Estimated: >60 mL/min (ref >60)
Glucose, Bld: 93 mg/dL (ref 70–99)
Potassium: 4.3 mmol/L (ref 3.5–5.1)
Sodium: 141 mmol/L (ref 135–145)

## 2020-12-01 LAB — CBC
HCT: 36.9 % (ref 36.0–46.0)
Hemoglobin: 11.8 g/dL — ABNORMAL LOW (ref 12.0–15.0)
MCH: 30.2 pg (ref 26.0–34.0)
MCHC: 32 g/dL (ref 30.0–36.0)
MCV: 94.4 fL (ref 80.0–100.0)
Platelets: 259 10*3/uL (ref 150–400)
RBC: 3.91 MIL/uL (ref 3.87–5.11)
RDW: 11.8 % (ref 11.5–15.5)
WBC: 7.9 10*3/uL (ref 4.0–10.5)
nRBC: 0 % (ref 0.0–0.2)

## 2020-12-01 MED ORDER — PANTOPRAZOLE SODIUM 40 MG PO TBEC
40.0000 mg | DELAYED_RELEASE_TABLET | Freq: Every day | ORAL | Status: DC
  Administered 2020-12-01 – 2020-12-10 (×9): 40 mg via ORAL
  Filled 2020-12-01 (×9): qty 1

## 2020-12-01 MED ORDER — HYDROMORPHONE HCL 1 MG/ML IJ SOLN
1.0000 mg | INTRAMUSCULAR | Status: DC | PRN
  Administered 2020-12-02 – 2020-12-05 (×15): 1 mg via INTRAVENOUS
  Filled 2020-12-01 (×16): qty 1

## 2020-12-01 MED ORDER — LIDOCAINE 5 % EX PTCH
1.0000 | MEDICATED_PATCH | CUTANEOUS | Status: DC
  Administered 2020-12-01 – 2020-12-08 (×8): 1 via TRANSDERMAL
  Filled 2020-12-01 (×11): qty 1

## 2020-12-01 MED ORDER — SENNA 8.6 MG PO TABS
1.0000 | ORAL_TABLET | Freq: Every day | ORAL | Status: DC
  Administered 2020-12-01 – 2020-12-03 (×3): 8.6 mg via ORAL
  Filled 2020-12-01 (×3): qty 1

## 2020-12-01 NOTE — Progress Notes (Signed)
PROGRESS NOTE    Monica [name]  BPZ:[id number] DOB: 1970-07-20 DOA: 11/30/2020 PCP: [name], PA-C    Brief Narrative:  Mrs. Prete was admitted to the hospital with working diagnosis of acute on chronic back pain due to newly diagnosed diffuse bony spine metastatic disease, with lytic pathologic fractures (thoracic,lumbar and pelvis) in the setting of right breast cancer.  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  She presented with right-sided back pain/flank pain for about 3 months, worsening over the last week but more severe over the last about 16 hours that prompted her to come to the hospital.  Apparently she was diagnosed with nephrolithiasis and received oral analgesics as an outpatient.  Despite outpatient medical therapy she continued to have severe pain to the point where she was not able to ambulate.  On her initial physical examination blood pressure 118/82, heart rate 58, respirate 18, oxygen saturation 100%.  Her lungs are clear to auscultation bilaterally, heart S1-S2, present, rhythmic, soft abdomen, no lower extremity edema, midline tenderness of the lumbar vertebrae, lumbar muscular tender to palpation.  Negative straight leg raising bilaterally but severe pain with active right hip flexion.  Sodium 137, potassium 3.9, chloride 105, bicarb 25, glucose 94, BUN 16, creatinine 0.8, white count 8.7, hemoglobin 0.9, hematocrit 36.6, platelets 253. SARS COVID-19 negative.  Urinalysis specific gravity 1.009, 0-5 red cells, 6-10 white cells.  Renal CT scan with diffuse bony metastatic disease with lytic features and associated pathologic fractures in the thoracic, lumbar spine and pelvis.  Spiculated nodules within the chest.  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  Large hepatic mass at least 10 total lesions.  Cystic area in left adnexa.   Pathologic fracture of the superior endplate on the right L4 with endplate concavity, mild endplate concavity at this level.   Nonobstructive left nephrolithiasis.  Brain MRI with multiple metastatic lesions in the brain, the largest cyst in the right posterior temporal lobe, show cystic change and hemorrhage.  No midline shift.  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  Assessment & Plan:   Active Problems:   Cancer related pain   Bone metastases ([place])   Metastatic malignant neoplasm ([place])   Brain metastases ([place])   1. Triple positive breast cancer stage 1B, now with evidence of diffuse metastatic disease to the spine (thoracic-lumbar and pelvis), brain, liver and lungs. Plan to continue pain control with acetaminophen and tramadol. Change morphine to hydromorphone IV as needed for sever pain.  Continue with systemic steroids.   Follow up with radiation oncology, neuro-oncology and oncology recommendations.   Patient will likely need radiation therapy.  2. DVT and GI prophylaxis. Enoxaparin and pantoprazole.    Patient continue to be at high risk for worsening pain.   Status is: Inpatient  Remains inpatient appropriate because:IV treatments appropriate due to intensity of illness or inability to take PO   Dispo: The patient is from: Home              Anticipated d/c is to: Home              Patient currently is not medically stable to d/c.   Difficult to place patient No    DVT prophylaxis: enoxaparin   Code Status:   full  Family Communication:  No family at the bedside       Consultants:   Oncology    Subjective: Patient continue to have back  pain, worse with movement, no nausea or vomiting, no chest pain or dyspnea.   Objective: Vitals:   11/30/20 1638 11/30/20 2004 11/30/20 2315 12/01/20 0337  BP: (!) 150/90 126/84 122/74 130/77  Pulse: 62 68 (!) 59 66  Resp: 16 18 18 17   Temp: 98.2 F (36.8 C) 98.1 F (36.7 C) 97.8 F (36.6 C) 98.2 F (36.8 C)   TempSrc: Oral Oral Oral Oral  SpO2: 99% 95% 98% 96%  Weight: 83.2 kg     Height: 5\' 6"  (1.676 m)       Intake/Output Summary (Last 24 hours) at 12/01/2020 1325 Last data filed at 11/30/2020 1425 Gross per 24 hour  Intake 2430.9 ml  Output --  Net 2430.9 ml   Filed Weights   11/30/20 1043 11/30/20 1638  Weight: 82.1 kg 83.2 kg    Examination:   General: Not in pain or dyspnea, deconditioned  Neurology: Awake and alert, non focal  E ENT: no pallor, no icterus, oral mucosa moist Cardiovascular: No JVD. S1-S2 present, rhythmic, no gallops, rubs, or murmurs. No lower extremity edema. Pulmonary: positive breath sounds bilaterally, adequate air movement, no wheezing, rhonchi or rales. Gastrointestinal. Abdomen soft and non tender Skin. No rashes Musculoskeletal: tender to palpation at the lower spine region.      Data Reviewed: I have personally reviewed following labs and imaging studies  CBC: Recent Labs  Lab 11/30/20 1153 12/01/20 0700  WBC 8.7 7.9  NEUTROABS 5.9  --   HGB 11.9* 11.8*  HCT 36.6 36.9  MCV 93.1 94.4  PLT 253 517   Basic Metabolic Panel: Recent Labs  Lab 11/30/20 1153 12/01/20 0700  NA 137 141  K 3.9 4.3  CL 105 107  CO2 25 29  GLUCOSE 94 93  BUN 16 11  CREATININE 0.80 0.74  CALCIUM 9.6 9.6   GFR: Estimated Creatinine Clearance: 92.5 mL/min (by C-G formula based on SCr of 0.74 mg/dL). Liver Function Tests: Recent Labs  Lab 11/30/20 1153  AST 39  ALT 34  ALKPHOS 85  BILITOT 0.4  PROT 6.9  ALBUMIN 3.5   No results for input(s): LIPASE, AMYLASE in the last 168 hours. No results for input(s): AMMONIA in the last 168 hours. Coagulation Profile: No results for input(s): INR, PROTIME in the last 168 hours. Cardiac Enzymes: No results for input(s): CKTOTAL, CKMB, CKMBINDEX, TROPONINI in the last 168 hours. BNP (last 3 results) No results for input(s): PROBNP in the last 8760 hours. HbA1C: No results for input(s): HGBA1C in the last  72 hours. CBG: No results for input(s): GLUCAP in the last 168 hours. Lipid Profile: No results for input(s): CHOL, HDL, LDLCALC, TRIG, CHOLHDL, LDLDIRECT in the last 72 hours. Thyroid Function Tests: No results for input(s): TSH, T4TOTAL, FREET4, T3FREE, THYROIDAB in the last 72 hours. Anemia Panel: No results for input(s): VITAMINB12, FOLATE, FERRITIN, TIBC, IRON, RETICCTPCT in the last 72 hours.    Radiology Studies: I have reviewed all of the imaging during this hospital visit personally     Scheduled Meds: . dexamethasone  4 mg Oral BID WC  . enoxaparin (LOVENOX) injection  40 mg Subcutaneous Q24H  . tamoxifen  20 mg Oral Daily   Continuous Infusions:   LOS: 1 day        Saragrace Selke Gerome Apley, MD

## 2020-12-01 NOTE — Progress Notes (Signed)
Monica [name]   HEMATOLOGY/ONCOLOGY INPATIENT PROGRESS NOTE  Date of Service: 12/01/2020  Inpatient Attending: .Arrien, Jimmy Picket,*   SUBJECTIVE  Patient notes that her back pain is better today.  No headaches.  No new focal neurological deficits.  Is concerned about overall in good spirits.  We discussed in detail all her imaging studies including CT chest abdomen pelvis, MRI brain and MRI of her total spine.  All her questions related to this were answered in details. We discussed need for consulting radiation oncology in light of her significant brain metastatic disease and significant burden of spinal mets ,which she is agreeable with.   OBJECTIVE:  NAD  PHYSICAL EXAMINATION: . Vitals:   11/30/20 1638 11/30/20 2004 11/30/20 2315 12/01/20 0337  BP: (!) 150/90 126/84 122/74 130/77  Pulse: 62 68 (!) 59 66  Resp: 16 18 18 17   Temp: 98.2 F (36.8 C) 98.1 F (36.7 C) 97.8 F (36.6 C) 98.2 F (36.8 C)  TempSrc: Oral Oral Oral Oral  SpO2: 99% 95% 98% 96%  Weight: 183 lb 6.4 oz (83.2 kg)     Height: 5\' 6"  (1.676 m)      Filed Weights   11/30/20 1043 11/30/20 1638  Weight: 181 lb (82.1 kg) 183 lb 6.4 oz (83.2 kg)   .Body mass index is 29.6 kg/m.  GENERAL:alert, in no acute distress and comfortable SKIN: skin color, texture, turgor are normal, no rashes or significant lesions EYES: normal, conjunctiva are pink and non-injected, sclera clear OROPHARYNX:no exudate, no erythema and lips, buccal mucosa, and tongue normal  NECK: supple, no JVD, thyroid normal size, non-tender, without nodularity LYMPH:  no palpable lymphadenopathy in the cervical, axillary or inguinal LUNGS: clear to auscultation with normal respiratory effort HEART: regular rate & rhythm,  no murmurs and no lower extremity edema ABDOMEN: abdomen soft, non-tender, normoactive bowel sounds  Musculoskeletal: no cyanosis of digits and no clubbing  PSYCH: alert & oriented x 3 with fluent speech NEURO: no focal  motor/sensory deficits  MEDICAL HISTORY:  Past Medical History:  Diagnosis Date  . Arthritis    lower back  . Cancer [facility])    right breast cancer  . Family history of breast cancer   . Personal history of chemotherapy   . Personal history of radiation therapy     SURGICAL HISTORY: Past Surgical History:  Procedure Laterality Date  . ABDOMINAL HYSTERECTOMY    . BREAST LUMPECTOMY Right   . BREAST LUMPECTOMY WITH RADIOACTIVE SEED AND SENTINEL LYMPH NODE BIOPSY Right 02/16/2018   Procedure: RIGHT BREAST LUMPECTOMY WITH RADIOACTIVE SEED AND SENTINEL LYMPH NODE BIOPSY;  Surgeon: [name], MD;  Location: [place];  Service: General;  Laterality: Right;  . HYMENECTOMY    . PORTACATH PLACEMENT Right 02/16/2018   Procedure: INSERTION PORT-A-CATH;  Surgeon: [name], MD;  Location: [place];  Service: General;  Laterality: Right;  . RE-EXCISION OF BREAST LUMPECTOMY Right 02/22/2018   Procedure: RE-EXCISION OF RIGHT  BREAST LUMPECTOMY;  Surgeon: [name], MD;  Location: [place];  Service: General;  Laterality: Right;  . REPAIR VAGINAL CUFF N/A 02/07/2017   Procedure: REPAIR VAGINAL CUFF;  Surgeon: Ena Dawley, MD;  Location: [facility];  Service: Gynecology;  Laterality: N/A;  . ROBOTIC ASSISTED TOTAL HYSTERECTOMY WITH SALPINGECTOMY Left 01/20/2017   Procedure: ROBOTIC ASSISTED TOTAL HYSTERECTOMY WITH SALPINGECTOMY;  Surgeon: [name], MD;  Location: [facility];  Service: Gynecology;  Laterality: Left;    SOCIAL HISTORY: Social History  Socioeconomic History  . Marital status: Legally Separated    Spouse name: Not on file  . Number of children: Not on file  . Years of education: Not on file  . Highest education level: Not on file  Occupational History  . Not on file  Tobacco Use  . Smoking status: Never Smoker  . Smokeless tobacco: Never Used  Vaping Use  . Vaping Use: Some days  Substance and Sexual Activity  .  Alcohol use: Yes    Comment: occ  . Drug use: No  . Sexual activity: Not Currently    Birth control/protection: Surgical  Other Topics Concern  . Not on file  Social History Narrative  . Not on file   Social Determinants of Health   Financial Resource Strain: Not on file  Food Insecurity: Not on file  Transportation Needs: Not on file  Physical Activity: Not on file  Stress: Not on file  Social Connections: Not on file  Intimate Partner Violence: Not on file    FAMILY HISTORY: Family History  Problem Relation Age of Onset  . Lung cancer Maternal Grandfather   . Esophageal cancer Paternal Grandfather 52  . Breast cancer Cousin 31    ALLERGIES:  has No Known Allergies.  MEDICATIONS:  Scheduled Meds: . dexamethasone  4 mg Oral BID WC  . enoxaparin (LOVENOX) injection  40 mg Subcutaneous Q24H  . tamoxifen  20 mg Oral Daily   Continuous Infusions: PRN Meds:.acetaminophen **OR** acetaminophen, morphine injection, polyethylene glycol, tiZANidine, traMADol  REVIEW OF SYSTEMS:    10 Point review of Systems was done is negative except as noted above.   LABORATORY DATA:  I have reviewed the data as listed  . CBC Latest Ref Rng & Units 12/01/2020 11/30/2020 06/20/2020  WBC 4.0 - 10.5 K/uL 7.9 8.7 6.3  Hemoglobin 12.0 - 15.0 g/dL 11.8(L) 11.9(L) 13.0  Hematocrit 36.0 - 46.0 % 36.9 36.6 40.5  Platelets 150 - 400 K/uL 259 253 230    . CMP Latest Ref Rng & Units 12/01/2020 11/30/2020 06/20/2020  Glucose 70 - 99 mg/dL 93 94 95  BUN 6 - 20 mg/dL 11 16 11   Creatinine 0.44 - 1.00 mg/dL 0.74 0.80 0.81  Sodium 135 - 145 mmol/L 141 137 139  Potassium 3.5 - 5.1 mmol/L 4.3 3.9 4.0  Chloride 98 - 111 mmol/L 107 105 104  CO2 22 - 32 mmol/L 29 25 26   Calcium 8.9 - 10.3 mg/dL 9.6 9.6 9.5  Total Protein 6.5 - 8.1 g/dL - 6.9 7.1  Total Bilirubin 0.3 - 1.2 mg/dL - 0.4 0.5  Alkaline Phos 38 - 126 U/L - 85 40  AST 15 - 41 U/L - 39 19  ALT 0 - 44 U/L - 34 14     RADIOGRAPHIC  STUDIES: I have personally reviewed the radiological images as listed and agreed with the findings in the report. DG Chest 2 View  Result Date: 11/15/2020 CLINICAL DATA:  Cough history of breast cancer EXAM: CHEST - 2 VIEW COMPARISON:  02/16/2018 FINDINGS: The heart size and mediastinal contours are within normal limits. Both lungs are clear. The visualized skeletal structures are unremarkable. Postsurgical changes of the right chest. IMPRESSION: No active cardiopulmonary disease. Electronically Signed   By: Donavan Foil M.D.   On: 11/15/2020 16:26   MR BRAIN W WO CONTRAST  Result Date: 11/30/2020 CLINICAL DATA:  Metastatic breast cancer, staging EXAM: MRI HEAD WITHOUT AND WITH CONTRAST TECHNIQUE: Multiplanar, multiecho pulse sequences of the brain and  surrounding structures were obtained without and with intravenous contrast. CONTRAST:  49mL GADAVIST GADOBUTROL 1 MMOL/ML IV SOLN COMPARISON:  None. FINDINGS: Brain: Multiple enhancing lesions the brain compatible with metastatic disease. Solid enhancing lesion left posterior cerebellum 10 mm axial image 48. Mixed cystic and solid lesion in the right posterior temporal lobe measures 38 x 19 mm on axial images. Mild surrounding edema. Mild associated hemorrhage. 18 mm enhancing and partially cystic mass in the right parietal lobe with mild hemorrhage 3 mm enhancing lesion left frontal cortex axial image 128 Ventricle size normal. No midline shift. No acute or chronic ischemic changes. Vascular: Normal arterial flow voids Skull and upper cervical spine: Enhancing lesions in the frontal bone bilaterally compatible with metastatic disease. Enhancing in the left lateral parietal bone. Sinuses/Orbits: Paranasal sinuses clear.  Negative orbit Other: None IMPRESSION: Multiple metastatic lesions in the brain. The largest lesion is in the right posterior temporal lobe and shows cystic change and hemorrhage. Additional lesions as above. No midline shift. Metastatic  disease to the calvarium. Electronically Signed   By: Franchot Gallo M.D.   On: 11/30/2020 18:57   CT CHEST ABDOMEN PELVIS W CONTRAST  Result Date: 11/30/2020 CLINICAL DATA:  Inpatient. Widespread metastatic disease to the brain, bones, liver and lungs. History of breast cancer. Right abdominal pain. EXAM: CT CHEST, ABDOMEN, AND PELVIS WITH CONTRAST TECHNIQUE: Multidetector CT imaging of the chest, abdomen and pelvis was performed following the standard protocol during bolus administration of intravenous contrast. CONTRAST:  24mL OMNIPAQUE IOHEXOL 300 MG/ML  SOLN COMPARISON:  11/30/2020 unenhanced CT abdomen/pelvis. FINDINGS: CT CHEST FINDINGS Cardiovascular: Normal heart size. No significant pericardial effusion/thickening. Great vessels are normal in course and caliber. No central pulmonary emboli. Mediastinum/Nodes: Subcentimeter hypodense left thyroid nodule. Not clinically significant; no follow-up imaging recommended (ref: J Am Coll Radiol. 2015 Feb;12(2): 143-50). Unremarkable esophagus. Surgical clip in the right axilla. No pathologically enlarged axillary nodes. No mediastinal or hilar adenopathy. Lungs/Pleura: No pneumothorax. No pleural effusion. Several (greater than 15) irregular solid pulmonary nodules scattered throughout the right greater than left lungs, largest 2.2 cm in the central right middle lobe (series 4/image 79), 1.5 cm in the anterior right lower lobe (series 4/image 105) and 0.7 cm in the superior segment right lower lobe (series 4/image 76). Minimal patchy subpleural reticulation in the anterior mid to upper right lung, compatible with minimal radiation fibrosis. Mild platelike atelectasis in the right middle lobe peripheral to the central lesion. Musculoskeletal: Widespread lytic osseous lesions in the thoracic spine with associated mild pathologic fractures of the T9, T11 and superior T12 vertebral bodies. Faint lytic lesion in the left manubrium. Mild thoracic spondylosis. CT  ABDOMEN PELVIS FINDINGS Hepatobiliary: Numerous (greater than 10) hypoenhancing liver masses scattered throughout the liver, largest 6.6 x 5.6 cm at the right liver dome (series 2/image 49), 5.0 x 4.0 cm in the central right liver adjacent to the IVC (series 2/image 59) and 4.2 x 2.8 cm in the segment 4 left liver (series 2/image 63). Normal gallbladder with no radiopaque cholelithiasis. No biliary ductal dilatation. Pancreas: Normal, with no mass or duct dilation. Spleen: Normal size. No mass. Adrenals/Urinary Tract: Normal adrenals. Subcentimeter hypodense anterior interpolar right renal cortical lesion is too small to characterize. Otherwise normal kidneys, with no hydronephrosis. Normal collapsed bladder. Stomach/Bowel: Normal non-distended stomach. Normal caliber small bowel with no small bowel wall thickening. Normal appendix. Oral contrast transits to the colon. Minimal sigmoid diverticulosis with no large bowel wall thickening or significant pericolonic fat stranding. Vascular/Lymphatic:  Normal caliber abdominal aorta. Patent portal, splenic, hepatic and renal veins. No pathologically enlarged lymph nodes in the abdomen or pelvis. Reproductive: Status post hysterectomy, with no abnormal findings at the vaginal cuff. No right adnexal mass. Left adnexal 3.8 cm simple cyst (series 2/image 111). No follow-up imaging recommended. Note: This recommendation does not apply to premenarchal patients and to those with increased risk (genetic, family history, elevated tumor markers or other high-risk factors) of ovarian cancer. Reference: JACR 2020 Feb; 17(2):248-254 Other: No pneumoperitoneum, ascites or focal fluid collection. Musculoskeletal: Widespread scattered lytic osseous lesions to lumbar vertebral bodies, most prominent at L3 with mild pathologic L2 vertebral compression fracture. Scattered lytic osseous lesions in the upper sacrum and left greater than right medial iliac bones bilaterally. Moderate  degenerative disc disease in the lower lumbar spine. IMPRESSION: 1. Widespread metastatic disease to the lungs, bones and liver as detailed, probably due to recurrent breast cancer. No definite separate potential primary neoplasm identified. 2. Mild pathologic T9, T11, T12 and L2 vertebral compression fractures. 3. Minimal sigmoid diverticulosis. Electronically Signed   By: Ilona Sorrel M.D.   On: 11/30/2020 19:23   CT Renal Stone Study  Addendum Date: 11/30/2020   ADDENDUM REPORT: 11/30/2020 13:49 ADDENDUM: These results were called by telephone at the time of interpretation on 11/30/2020 at 1:48 pm to provider Sherol Dade , who verbally acknowledged these results. Electronically Signed   By: Zetta Bills M.D.   On: 11/30/2020 13:49   Result Date: 11/30/2020 CLINICAL DATA:  RIGHT flank pain, kidney stone suspected. EXAM: CT ABDOMEN AND PELVIS WITHOUT CONTRAST TECHNIQUE: Multidetector CT imaging of the abdomen and pelvis was performed following the standard protocol without IV contrast. COMPARISON:  Only chest x-rays are available for comparison. FINDINGS: Lower chest: Spiculated nodular area in the RIGHT lower lobe (image 9/4) 1.6 x 1.5 cm. Septal thickening extending to the periphery of the RIGHT chest in the RIGHT middle lobe. Additional spiculated nodule 12 mm (image 44/4) RIGHT lower lobe. Smaller nodule at the periphery measuring 3-4 mm. No effusion. No consolidation. Hepatobiliary: Large hepatic masses. Largest in the RIGHT lobe (image 25/2 5.8 x 5.6 cm. Central hepatic mass (image 32/2 4.5 x 3.5 cm. LEFT lobe lesion (image 42/22.4 cm. Additional lesions scattered throughout the liver, at least 10 total lesions. No pericholecystic stranding. Pancreas: Pancreas with normal contour.  No sign of inflammation. Spleen: Spleen normal in size and contour. Adrenals/Urinary Tract: Adrenal glands are normal. Nephrolithiasis in the lower pole of the LEFT kidney 3 mm (image 50/2) smooth renal contours. No  hydronephrosis. No perinephric stranding. Urinary bladder under distended. Stomach/Bowel: Stomach is unremarkable. Small bowel normal caliber. Appendix is normal. No acute colonic process. Vascular/Lymphatic: Normal caliber of the abdominal aorta and IVC. There is no gastrohepatic or hepatoduodenal ligament lymphadenopathy. No retroperitoneal or mesenteric lymphadenopathy. No pelvic sidewall lymphadenopathy. Reproductive: Post hysterectomy. Cystic area in the LEFT adnexa measuring 4.2 x 3.2 cm in axial dimension and 4.0 cm in greatest craniocaudal extent. Other: No ascites.  No free air. Musculoskeletal: Diffuse bony metastatic disease with lytic features. For instance, in the LEFT posterior iliac with destructive features on image 65 of series 2 is a 3.1 cm lytic focus.a this is associated with pathologic fracture of the posterior LEFT iliac. Lytic lesions are seen to varying degrees throughout the thoracic and the lumbar spine nearly every level of the spine is involved. Also with a large sacral metastasis on image 63 of series 2 measuring 3.6 x 3.6 cm. Mild loss  of height due to pathologic fracture at the T9 vertebral level approximately 10-15% loss of height. Lytic metastases at the remaining levels of the thoracic spine with mild loss of height at T12 as well likely due to pathologic endplate fracture. At the T10 level there is suggestion of soft tissue extending posteriorly into the central canal, difficult to determine on noncontrast evaluation but with posterior cortical destruction and potential extension is much as 7 mm into the central canal posterior to the T10 level. Multilevel metastatic disease in the lumbar spine as well largest lesion approximately 1.5 cm in the L3 vertebral level. Pathologic fracture of the superior endplate on the RIGHT at L4 with endplate concavity, mild endplate concavity at this level. IMPRESSION: 1. Diffuse bony metastatic disease with lytic features and associated pathologic  fractures in the thoracic, lumbar spine and in the pelvis. 2. Potential soft tissue extension into the central canal at the T10 level. Correlate with symptoms and suggest MRI for further assessment as warranted. 3. Spiculated nodules in the chest with morphologic features that would be more suggestive of bronchogenic neoplasm but given multiplicity could also be seen with metastatic disease. 4. 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 5. Large hepatic masses, at least 10 total lesions. Findings are concerning for metastatic disease. 6. Cystic area in the LEFT adnexa measuring 4.2 x 3.2 cm in axial dimension and 4.0 cm in greatest craniocaudal extent. This is indeterminate, standard follow-up recommendations may not apply in this instance. Normally no follow-up would be recommended, however in the current context could consider follow-up sonogram for further evaluation on a nonemergent basis. 7. Pathologic fracture of the superior endplate on the RIGHT at L4 with endplate concavity, mild endplate concavity at this level. 8. Nonobstructive LEFT nephrolithiasis. 9. Aortic atherosclerosis. Electronically Signed: By: Zetta Bills M.D. On: 11/30/2020 13:16   MR TOTAL SPINE METS SCREENING  Result Date: 11/30/2020 CLINICAL DATA:  Staging of unknown primary cancer EXAM: MRI TOTAL SPINE WITHOUT AND WITH CONTRAST TECHNIQUE: Multisequence MR imaging of the spine from the cervical spine to the sacrum was performed prior to and following IV contrast administration for evaluation of spinal metastatic disease. COMPARISON:  None. FINDINGS: There are numerous metastatic lesions throughout the spine. There are lesions at most levels, but the largest lesions are at T9, T10, T11, T12, L2, L3 and S1. There is no compression fracture. There is no abnormal epidural contrast enhancement. Spinal cord parenchyma is normal. Localizer images show multiple lesions within the liver. There is a nodular mass at the right lung base.  IMPRESSION: 1. Numerous metastatic lesions of the cervical, thoracic and lumbar spine. The greatest burden of lesions are at the T9 level and below, but there are lesions as high as C3. 2. No pathologic fracture. 3. No epidural mass. 4. Likely metastatic lesions within the liver and at the right lung base, better evaluated on earlier CT. Electronically Signed   By: Ulyses Jarred M.D.   On: 11/30/2020 19:11    ASSESSMENT & PLAN:    Patient is a wonderful 50 yo female with H/o Triple Positive Breast cancer Stage IB diagnosed in 02/2018 now with concern for metastatic disease in the bones, liver and lungs.  1) Likely Metastatic triple positive breast cancer with bone, liver and lung metastases. 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 She is s/p lumpectomy with SNB on 02/16/2018 showing Stage IB disease,grade 3 with a positive inferior margin. She had additional surgery on 02/27/2018.  She was treated with Carboplatin, docetaxel, trastuzumab and pertuzumab08/30/2019, repeated every 21 days x 6, last dose 07/18/2018.   Pertuzumab omitted after cycle 1 due to diarrhea.  Gemcitabinesubstituted for docetaxel starting with cycle 5 due to peripheral neuropathy             continued trastuzumabto total 6 months (through February 2020).              adjuvant radiation 08/15/2018 - 09/28/2018              started tamoxifenMarch 2020 the patient is status post hysterectomy, without bilateral salpingo-oophorectomy FSH and estradiol levels June 2020 show she is pre menopausal  4) extensive spinal metastases without any obviously reported pathologic fracture or cord  compression on MRI MRI spine: Numerous metastatic lesions of the cervical, thoracic and lumbar spine. The greatest burden of lesions are at the T9 level and below, but there are lesions as high as C3. 2. No pathologic fracture. 3. No epidural mass.  5) multiple lung metastases 6) multiple extensive liver metastases 7) Multiple brain metastases --without significant edema and some with hemorrhagic changes.  PLAN -Tumor markers including CA 125, CA 27 29 and CA 15-3 have been ordered -results pending. -In light of her multiple brain metastases with some hemorrhagic change would recommend consulting radiation oncology for consideration of possible SRS/WBRT. -Would also recommend consulting neuro oncology for input regarding management of brain metastases and continued monitoring and management of possible postradiation brain changes/radiation necrosis. -IR consultation for biopsy possibly of her large liver lesion for tissue diagnosis and adequate sampling for additional molecular testing. -I discussed her CT chest abdomen pelvis with her which makes metastatic triple positive breast cancer most likely primary and there is no other clear evidence of different primary site at this time. -We discussed MRI of the brain and MRI T-spine. -Continue pain management as per hospital medicine. -Would continue dexamethasone 4 mg p.o. with breakfast and lunch especially with consideration of brain metastases and possible radiation. -We will likely need bone directed therapies to reduce skeletal related events. No current dental issues -I have messaged Dr. Jana Hakim and the breast cancer navigator.  Definitive systemic therapy recommendations based on final tissue diagnosis. -I anticipate that if the patient is doing well she might discharge after radiation oncology plan and her IR biopsy.   I spent 25 minutes counseling the patient face to face. The total time spent in the appointment was 35 minutes and  more than 50% was on counseling and direct patient cares.    Sullivan Lone MD [place] AAHIVMS [facility] Hematology/Oncology Physician [facility]  (Office):       [phone] (Work cell):  [phone] (Fax):           [phone]  12/01/2020 11:05 AM

## 2020-12-01 NOTE — Consult Note (Signed)
Chief Complaint: Patient was seen in consultation today for liver lesion biopsy.  Referring Physician(s): Laure Kidney, Gautam  Supervising Physician: Jacqulynn Cadet  Patient Status: [facility] - In-pt  History of Present Illness: Monica [name] is a 50 y.o. female with a past medical history significant for right breast cancer s/p lumpectomy who presented to [facility] ED on 5/22 with complaints of back pain for approximately 1 month that had acutely worsened and required her to use a cane for ambulation. She was found to have imaging findings concerning for widespread metastasis including diffuse bony metastatic diease with lytic features, pathology fracture of the thoracic and lumbar spine as well as the pelvis, pulmonary nodules and multiple liver lesions. IR was asked to see patient for liver lesion biopsy as well as possible vertebroplasty due to CT noting pathologic fractures.  Patient seen in her room today, she is laying on her side - she tells me that it's pretty difficult to find a comfortable position in bed due to pain but with pain medication she is able to tolerate laying on her side for awhile. When she stands she has excruciating pain which causes her difficulty walking, she has had so much trouble with walking that she now uses a cane at home. She also has been having trouble completing nearly all of her ADLs due to having to hold herself up with one hand while doing most tasks (I.e. washing dishes, turning over in bed, going from laying to sitting to standing, etc). The pain is mostly in her mid to lower back area and has been steadily worsening for about a month now. She understands that she has metastatic disease and that we've been asked to perform a biopsy to further direct her care. We also discussed OsteoCool ablation for the pathologic fractures at T9 and T12 which is something she is interested in as well.  Past Medical History:  Diagnosis Date  . Arthritis    lower back   . Cancer [facility])    right breast cancer  . Family history of breast cancer   . Personal history of chemotherapy   . Personal history of radiation therapy     Past Surgical History:  Procedure Laterality Date  . ABDOMINAL HYSTERECTOMY    . BREAST LUMPECTOMY Right   . BREAST LUMPECTOMY WITH RADIOACTIVE SEED AND SENTINEL LYMPH NODE BIOPSY Right 02/16/2018   Procedure: RIGHT BREAST LUMPECTOMY WITH RADIOACTIVE SEED AND SENTINEL LYMPH NODE BIOPSY;  Surgeon: [name], MD;  Location: [place];  Service: General;  Laterality: Right;  . HYMENECTOMY    . PORTACATH PLACEMENT Right 02/16/2018   Procedure: INSERTION PORT-A-CATH;  Surgeon: [name], MD;  Location: [place];  Service: General;  Laterality: Right;  . RE-EXCISION OF BREAST LUMPECTOMY Right 02/22/2018   Procedure: RE-EXCISION OF RIGHT  BREAST LUMPECTOMY;  Surgeon: [name], MD;  Location: [place];  Service: General;  Laterality: Right;  . REPAIR VAGINAL CUFF N/A 02/07/2017   Procedure: REPAIR VAGINAL CUFF;  Surgeon: Ena Dawley, MD;  Location: [place] [facility];  Service: Gynecology;  Laterality: N/A;  . ROBOTIC ASSISTED TOTAL HYSTERECTOMY WITH SALPINGECTOMY Left 01/20/2017   Procedure: ROBOTIC ASSISTED TOTAL HYSTERECTOMY WITH SALPINGECTOMY;  Surgeon: [name], MD;  Location: [place] [facility];  Service: Gynecology;  Laterality: Left;    Allergies: Patient has no known allergies.  Medications: Prior to Admission medications   Medication Sig Start Date End Date Taking? Authorizing Provider  acetaminophen (TYLENOL) 500 MG tablet Take 500  mg by mouth every 6 (six) hours as needed for moderate pain.   Yes [provider]  Biotin 70017 MCG TABS Take 10,000 mg by mouth daily.   Yes [provider]  cholecalciferol (VITAMIN D3) 25 MCG (1000 UNIT) tablet Take 1 tablet (1,000 Units total) by mouth daily. 06/20/20  Yes Magrinat, Valentino Hue, MD  Ferrous Sulfate (IRON) 325 (65 Fe)  MG TABS Take 325 mg by mouth daily.   Yes [provider]  ibuprofen (ADVIL) 200 MG tablet Take 800 mg by mouth every 6 (six) hours as needed for mild pain.   Yes [provider]  ketorolac (TORADOL) 10 MG tablet Take 10 mg by mouth 2 (two) times daily as needed for moderate pain.   Yes [provider]  loratadine (CLARITIN) 10 MG tablet Take 10 mg by mouth daily as needed for allergies.   Yes [provider]  Maca Root (MACA PO) Take 1 tablet by mouth daily.   Yes [provider]  naproxen sodium (ALEVE) 220 MG tablet Take 440 mg by mouth daily as needed (pain).   Yes [provider]  Olopatadine HCl (PATADAY OP) Place 1 drop into both eyes daily.   Yes [provider]  tamoxifen (NOLVADEX) 20 MG tablet Take 1 tablet (20 mg total) by mouth daily. 06/20/20  Yes Magrinat, Valentino Hue, MD  tamsulosin (FLOMAX) 0.4 MG CAPS capsule Take 0.4 mg by mouth daily. 11/29/20  Yes [provider]  tiZANidine (ZANAFLEX) 4 MG tablet Take 4 mg by mouth 3 (three) times daily as needed for pain. 11/22/20  Yes [provider]  traMADol (ULTRAM) 50 MG tablet Take 50 mg by mouth 2 (two) times daily as needed for severe pain. 11/29/20  Yes [provider]  TURMERIC CURCUMIN PO Take 1 tablet by mouth daily.   Yes [provider]  ibuprofen (ADVIL,MOTRIN) 800 MG tablet Take 1 tablet (800 mg total) by mouth every 8 (eight) hours as needed. Patient not taking: No sig reported 02/16/18   Harriette Bouillon, MD     Family History  Problem Relation Age of Onset  . Lung cancer Maternal Grandfather   . Esophageal cancer Paternal Grandfather 75  . Breast cancer Cousin 41    Social History   Socioeconomic History  . Marital status: Legally Separated    Spouse name: Not on file  . Number of children: Not on file  . Years of education: Not on file  . Highest education level: Not on file  Occupational History  . Not on file  Tobacco  Use  . Smoking status: Never Smoker  . Smokeless tobacco: Never Used  Vaping Use  . Vaping Use: Some days  Substance and Sexual Activity  . Alcohol use: Yes    Comment: occ  . Drug use: No  . Sexual activity: Not Currently    Birth control/protection: Surgical  Other Topics Concern  . Not on file  Social History Narrative  . Not on file   Social Determinants of Health   Financial Resource Strain: Not on file  Food Insecurity: Not on file  Transportation Needs: Not on file  Physical Activity: Not on file  Stress: Not on file  Social Connections: Not on file     Review of Systems: A 12 point ROS discussed and pertinent positives are indicated in the HPI above.  All other systems are negative.  Review of Systems  Constitutional: Positive for activity change (Difficult to walk/complete any basic  tasks or chores due to pain) and fatigue. Negative for appetite change, chills and fever.  Respiratory: Negative for cough and shortness of breath.   Cardiovascular: Negative for chest pain.  Gastrointestinal: Negative for abdominal pain, nausea and vomiting.  Genitourinary: Negative for hematuria.  Musculoskeletal: Positive for back pain and gait problem.  Neurological: Negative for dizziness, tremors, weakness, numbness and headaches.    Vital Signs: BP 130/77 (BP Location: Left Arm)   Pulse 66   Temp 98.2 F (36.8 C) (Oral)   Resp 17   Ht 5\' 6"  (1.676 m)   Wt 183 lb 6.4 oz (83.2 kg)   LMP 12/21/2016 (Exact Date)   SpO2 96%   BMI 29.60 kg/m   Physical Exam Vitals and nursing note reviewed.  Constitutional:      General: She is not in acute distress. HENT:     Head: Normocephalic.     Mouth/Throat:     Mouth: Mucous membranes are moist.     Pharynx: Oropharynx is clear. No oropharyngeal exudate or posterior oropharyngeal erythema.  Cardiovascular:     Rate and Rhythm: Normal rate and regular rhythm.  Pulmonary:     Effort: Pulmonary effort is normal.     Breath  sounds: Normal breath sounds.  Abdominal:     General: There is no distension.     Palpations: Abdomen is soft.     Tenderness: There is no abdominal tenderness.  Musculoskeletal:     Comments: (+) point tenderness from lower rib cage area to just above sacrum which roughly corresponds to areas of bony metastases seen on imaging. No tenderness of upper thoracic or cervical spine. No sacral tenderness.  Skin:    General: Skin is warm and dry.  Neurological:     Mental Status: She is alert and oriented to person, place, and time.  Psychiatric:        Mood and Affect: Mood normal.        Behavior: Behavior normal.        Thought Content: Thought content normal.        Judgment: Judgment normal.      MD Evaluation Airway: WNL Heart: WNL Abdomen: WNL Chest/ Lungs: WNL ASA  Classification: 3 Mallampati/Airway Score: One   Imaging: DG Chest 2 View  Result Date: 11/15/2020 CLINICAL DATA:  Cough history of breast cancer EXAM: CHEST - 2 VIEW COMPARISON:  02/16/2018 FINDINGS: The heart size and mediastinal contours are within normal limits. Both lungs are clear. The visualized skeletal structures are unremarkable. Postsurgical changes of the right chest. IMPRESSION: No active cardiopulmonary disease. Electronically Signed   By: Donavan Foil M.D.   On: 11/15/2020 16:26   MR BRAIN W WO CONTRAST  Result Date: 11/30/2020 CLINICAL DATA:  Metastatic breast cancer, staging EXAM: MRI HEAD WITHOUT AND WITH CONTRAST TECHNIQUE: Multiplanar, multiecho pulse sequences of the brain and surrounding structures were obtained without and with intravenous contrast. CONTRAST:  85mL GADAVIST GADOBUTROL 1 MMOL/ML IV SOLN COMPARISON:  None. FINDINGS: Brain: Multiple enhancing lesions the brain compatible with metastatic disease. Solid enhancing lesion left posterior cerebellum 10 mm axial image 48. Mixed cystic and solid lesion in the right posterior temporal lobe measures 38 x 19 mm on axial images. Mild  surrounding edema. Mild associated hemorrhage. 18 mm enhancing and partially cystic mass in the right parietal lobe with mild hemorrhage 3 mm enhancing lesion left frontal cortex axial image 128 Ventricle size normal. No midline shift. No acute or chronic ischemic changes. Vascular: Normal arterial  flow voids Skull and upper cervical spine: Enhancing lesions in the frontal bone bilaterally compatible with metastatic disease. Enhancing in the left lateral parietal bone. Sinuses/Orbits: Paranasal sinuses clear.  Negative orbit Other: None IMPRESSION: Multiple metastatic lesions in the brain. The largest lesion is in the right posterior temporal lobe and shows cystic change and hemorrhage. Additional lesions as above. No midline shift. 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 Electronically Signed   By: Franchot Gallo M.D.   On: 11/30/2020 18:57   CT CHEST ABDOMEN PELVIS W CONTRAST  Result Date: 11/30/2020 CLINICAL DATA:  Inpatient. Widespread metastatic disease to the brain, bones, liver and lungs. History of breast cancer. Right abdominal pain. EXAM: CT CHEST, ABDOMEN, AND PELVIS WITH CONTRAST TECHNIQUE: Multidetector CT imaging of the chest, abdomen and pelvis was performed following the standard protocol during bolus administration of intravenous contrast. CONTRAST:  75mL OMNIPAQUE IOHEXOL 300 MG/ML  SOLN COMPARISON:  11/30/2020 unenhanced CT abdomen/pelvis. FINDINGS: CT CHEST FINDINGS Cardiovascular: Normal heart size. No significant pericardial effusion/thickening. Great vessels are normal in course and caliber. No central pulmonary emboli. Mediastinum/Nodes: Subcentimeter hypodense left thyroid nodule. Not clinically significant; no follow-up imaging recommended (ref: J Am Coll Radiol. 2015 Feb;12(2): 143-50). Unremarkable esophagus. Surgical clip in the right axilla. No pathologically enlarged axillary nodes. No mediastinal or hilar adenopathy. Lungs/Pleura: No pneumothorax. No pleural effusion. Several  (greater than 15) irregular solid pulmonary nodules scattered throughout the right greater than left lungs, largest 2.2 cm in the central right middle lobe (series 4/image 79), 1.5 cm in the anterior right lower lobe (series 4/image 105) and 0.7 cm in the superior segment right lower lobe (series 4/image 76). Minimal patchy subpleural reticulation in the anterior mid to upper right lung, compatible with minimal radiation fibrosis. Mild platelike atelectasis in the right middle lobe peripheral to the central lesion. Musculoskeletal: Widespread lytic osseous lesions in the thoracic spine with associated mild pathologic fractures of the T9, T11 and superior T12 vertebral bodies. Faint lytic lesion in the left manubrium. Mild thoracic spondylosis. CT ABDOMEN PELVIS FINDINGS Hepatobiliary: Numerous (greater than 10) hypoenhancing liver masses scattered throughout the liver, largest 6.6 x 5.6 cm at the right liver dome (series 2/image 49), 5.0 x 4.0 cm in the central right liver adjacent to the IVC (series 2/image 59) and 4.2 x 2.8 cm in the segment 4 left liver (series 2/image 63). Normal gallbladder with no radiopaque cholelithiasis. No biliary ductal dilatation. Pancreas: Normal, with no mass or duct dilation. Spleen: Normal size. No mass. Adrenals/Urinary Tract: Normal adrenals. Subcentimeter hypodense anterior interpolar right renal cortical lesion is too small to characterize. Otherwise normal kidneys, with no hydronephrosis. Normal collapsed bladder. Stomach/Bowel: Normal non-distended stomach. Normal caliber small bowel with no small bowel wall thickening. Normal appendix. Oral contrast transits to the colon. Minimal sigmoid diverticulosis with no large bowel wall thickening or significant pericolonic fat stranding. Vascular/Lymphatic: Normal caliber abdominal aorta. Patent portal, splenic, hepatic and renal veins. No pathologically enlarged lymph nodes in the abdomen or pelvis. Reproductive: Status post  hysterectomy, with no abnormal findings at the vaginal cuff. No right adnexal mass. Left adnexal 3.8 cm simple cyst (series 2/image 111). No follow-up imaging recommended. Note: This recommendation does not apply to premenarchal patients and to those with increased risk (genetic, family history, elevated tumor markers or other high-risk factors) of ovarian cancer. Reference: JACR 2020 Feb; 17(2):248-254 Other: No pneumoperitoneum, ascites or focal fluid collection. Musculoskeletal: Widespread scattered lytic osseous lesions to lumbar vertebral bodies, most prominent at L3 with mild pathologic  L2 vertebral compression fracture. Scattered lytic osseous lesions in the upper sacrum and left greater than right medial iliac bones bilaterally. Moderate degenerative disc disease in the lower lumbar spine. IMPRESSION: 1. Widespread metastatic disease to the lungs, bones and liver as detailed, probably due to recurrent breast cancer. No definite separate potential primary neoplasm identified. 2. Mild pathologic T9, T11, T12 and L2 vertebral compression fractures. 3. Minimal sigmoid diverticulosis. Electronically Signed   By: Ilona Sorrel M.D.   On: 11/30/2020 19:23   CT Renal Stone Study  Addendum Date: 11/30/2020   ADDENDUM REPORT: 11/30/2020 13:49 ADDENDUM: These results were called by telephone at the time of interpretation on 11/30/2020 at 1:48 pm to provider Sherol Dade , who verbally acknowledged these results. Electronically Signed   By: Zetta Bills M.D.   On: 11/30/2020 13:49   Result Date: 11/30/2020 CLINICAL DATA:  RIGHT flank pain, kidney stone suspected. EXAM: CT ABDOMEN AND PELVIS WITHOUT CONTRAST TECHNIQUE: Multidetector CT imaging of the abdomen and pelvis was performed following the standard protocol without IV contrast. COMPARISON:  Only chest x-rays are available for comparison. FINDINGS: Lower chest: Spiculated nodular area in the RIGHT lower lobe (image 9/4) 1.6 x 1.5 cm. Septal thickening  extending to the periphery of the RIGHT chest in the RIGHT middle lobe. Additional spiculated nodule 12 mm (image 44/4) RIGHT lower lobe. Smaller nodule at the periphery measuring 3-4 mm. No effusion. No consolidation. Hepatobiliary: Large hepatic masses. Largest in the RIGHT lobe (image 25/2 5.8 x 5.6 cm. Central hepatic mass (image 32/2 4.5 x 3.5 cm. LEFT lobe lesion (image 42/22.4 cm. Additional lesions scattered throughout the liver, at least 10 total lesions. No pericholecystic stranding. Pancreas: Pancreas with normal contour.  No sign of inflammation. Spleen: Spleen normal in size and contour. Adrenals/Urinary Tract: Adrenal glands are normal. Nephrolithiasis in the lower pole of the LEFT kidney 3 mm (image 50/2) smooth renal contours. No hydronephrosis. No perinephric stranding. Urinary bladder under distended. Stomach/Bowel: Stomach is unremarkable. Small bowel normal caliber. Appendix is normal. No acute colonic process. Vascular/Lymphatic: Normal caliber of the abdominal aorta and IVC. There is no gastrohepatic or hepatoduodenal ligament lymphadenopathy. No retroperitoneal or mesenteric lymphadenopathy. No pelvic sidewall lymphadenopathy. Reproductive: Post hysterectomy. Cystic area in the LEFT adnexa measuring 4.2 x 3.2 cm in axial dimension and 4.0 cm in greatest craniocaudal extent. Other: No ascites.  No free air. Musculoskeletal: Diffuse bony metastatic disease with lytic features. For instance, in the LEFT posterior iliac with destructive features on image 65 of series 2 is a 3.1 cm lytic focus.a this is associated with pathologic fracture of the posterior LEFT iliac. Lytic lesions are seen to varying degrees throughout the thoracic and the lumbar spine nearly every level of the spine is involved. Also with a large sacral metastasis on image 63 of series 2 measuring 3.6 x 3.6 cm. Mild loss of height due to pathologic fracture at the T9 vertebral level approximately 10-15% loss of height. Lytic  metastases at the remaining levels of the thoracic spine with mild loss of height at T12 as well likely due to pathologic endplate fracture. At the T10 level there is suggestion of soft tissue extending posteriorly into the central canal, difficult to determine on noncontrast evaluation but with posterior cortical destruction and potential extension is much as 7 mm into the central canal posterior to the T10 level. Multilevel metastatic disease in the lumbar spine as well largest lesion approximately 1.5 cm in the L3 vertebral level. Pathologic fracture of the  superior endplate on the RIGHT at L4 with endplate concavity, mild endplate concavity at this level. IMPRESSION: 1. Diffuse bony metastatic disease with lytic features and associated pathologic fractures in the thoracic, lumbar spine and in the pelvis. 2. Potential soft tissue extension into the central canal at the T10 level. Correlate with symptoms and suggest MRI for further assessment as warranted. 3. Spiculated nodules in the chest with morphologic features that would be more suggestive of bronchogenic neoplasm but given multiplicity could also be seen with metastatic disease. 4. 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 5. Large hepatic masses, at least 10 total lesions. Findings are concerning for metastatic disease. 6. Cystic area in the LEFT adnexa measuring 4.2 x 3.2 cm in axial dimension and 4.0 cm in greatest craniocaudal extent. This is indeterminate, standard follow-up recommendations may not apply in this instance. Normally no follow-up would be recommended, however in the current context could consider follow-up sonogram for further evaluation on a nonemergent basis. 7. Pathologic fracture of the superior endplate on the RIGHT at L4 with endplate concavity, mild endplate concavity at this level. 8. Nonobstructive LEFT nephrolithiasis. 9. Aortic atherosclerosis. Electronically Signed: By: Zetta Bills M.D. On: 11/30/2020 13:16   MR TOTAL  SPINE METS SCREENING  Result Date: 11/30/2020 CLINICAL DATA:  Staging of unknown primary cancer EXAM: MRI TOTAL SPINE WITHOUT AND WITH CONTRAST TECHNIQUE: Multisequence MR imaging of the spine from the cervical spine to the sacrum was performed prior to and following IV contrast administration for evaluation of spinal metastatic disease. COMPARISON:  None. FINDINGS: There are numerous metastatic lesions throughout the spine. There are lesions at most levels, but the largest lesions are at T9, T10, T11, T12, L2, L3 and S1. There is no compression fracture. There is no abnormal epidural contrast enhancement. Spinal cord parenchyma is normal. Localizer images show multiple lesions within the liver. There is a nodular mass at the right lung base. IMPRESSION: 1. Numerous metastatic lesions of the cervical, thoracic and lumbar spine. The greatest burden of lesions are at the T9 level and below, but there are lesions as high as C3. 2. No pathologic fracture. 3. No epidural mass. 4. Likely metastatic lesions within the liver and at the right lung base, better evaluated on earlier CT. Electronically Signed   By: Ulyses Jarred M.D.   On: 11/30/2020 19:11    Labs:  CBC: Recent Labs    06/20/20 0839 11/30/20 1153 12/01/20 0700  WBC 6.3 8.7 7.9  HGB 13.0 11.9* 11.8*  HCT 40.5 36.6 36.9  PLT 230 253 259    COAGS: No results for input(s): INR, APTT in the last 8760 hours.  BMP: Recent Labs    06/20/20 0839 11/30/20 1153 12/01/20 0700  NA 139 137 141  K 4.0 3.9 4.3  CL 104 105 107  CO2 26 25 29   GLUCOSE 95 94 93  BUN 11 16 11   CALCIUM 9.5 9.6 9.6  CREATININE 0.81 0.80 0.74  GFRNONAA >60 >60 >60    LIVER FUNCTION TESTS: Recent Labs    06/20/20 0839 11/30/20 1153  BILITOT 0.5 0.4  AST 19 39  ALT 14 34  ALKPHOS 40 85  PROT 7.1 6.9  ALBUMIN 3.9 3.5    TUMOR MARKERS: No results for input(s): AFPTM, CEA, CA199, CHROMGRNA in the last 8760 hours.  Assessment and Plan:  50 y/o F with  history of right breast cancer s/p lumpectomy who presented to [facility] ED yesterday with worsening back pain x 1 month. The pain  had become so intense that she required a cane to walk. She was found to have diffuse metastatic disease of unknown primary including multiple liver metastases and pathologic fractures of the thoracic and lumbar spine. IR was asked to see patient for possible vertebroplasty as well as liver lesion biopsy.  Patient approved for liver lesion biopsy - will tentatively plan for procedure tomorrow (5/23) pending any emergent procedures. Patient to be NPO at midnight, hold anticoagulation until post procedure, AM labs pending.   She has also been approved for T9 and T12 OsteCool ablation for pain management. On exam today she is experiencing point tenderness which corresponds to these areas and she has been having progressive difficulty completing her ADLs (now requires cane to ambulate, needs to hold herself up with one hand to do any task, needs to pull herself up from laying to sitting to standing) which she did not have before. Pain medications have been somewhat effective but they do cause some sedation. She is interested in this procedure - we will coordinate timing for this and follow up with patient.  Risks and benefits of liver lesion biopsy was discussed with the patient and/or patient's family including, but not limited to bleeding, infection, damage to adjacent structures or low yield requiring additional tests.  All of the questions were answered and there is agreement to proceed.  Consent signed and in chart.   Thank you for this interesting consult.  I greatly enjoyed meeting Noela Macbeth and look forward to participating in their care.  A copy of this report was sent to the requesting provider on this date.  Electronically Signed: Joaquim Nam, PA-C 12/01/2020, 11:27 AM   I spent a total of 40 Minutes  in face to face in clinical consultation, greater than  50% of which was counseling/coordinating care for liver lesion biopsy/possible OsteoCool ablation.

## 2020-12-01 NOTE — Consult Note (Signed)
Marland Kitchen    HEMATOLOGY/ONCOLOGY CONSULTATION NOTE  Date of Service: 12/01/2020  Patient Care Team: [name], PA-C as PCP - General (Family Medicine) [name], MD as Consulting Physician (General Surgery) Magrinat, [name], MD as Consulting Physician (Oncology) [name], MD as Consulting Physician (Radiation Oncology) [name], MD as Consulting Physician (Obstetrics and Gynecology) Larey Dresser, MD as Consulting Physician (Cardiology)  CHIEF COMPLAINTS/PURPOSE OF CONSULTATION:  Newly noted metastatic malignancy -- likely metastatic breast cancer.   HISTORY OF PRESENTING ILLNESS:   Monica [name] is a wonderful 50 y.o. female who has a h/o triple positive Stage Ib breast cancer who followed with Dr Jana Hakim for oncologic cares. 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 She had additional surgery on 02/27/2018.  She was treated with Carboplatin, docetaxel, trastuzumab and pertuzumab 03/11/2018, repeated every 21 days x 6, last dose 07/18/2018.                Pertuzumab omitted after cycle 1 due to diarrhea.              Gemcitabine substituted for docetaxel starting with cycle 5 due to peripheral neuropathy             continued trastuzumab to total 6 months (through February 2020).   adjuvant radiation 08/15/2018 - 09/28/2018   started tamoxifen March 2020             the patient is status post hysterectomy, without bilateral salpingo-oophorectomy             FSH and estradiol levels June 2020 show she is pre menopausal  Patient continues to be on tamoxifen. Patient notes that she has been having significant back pain in multiple places in her back over the last 4-5 months which she attributed to arthritis. She has been following with a chiropracter for adjustments. X ray of Cervical /thoracic and  lumbar spine were done on 09/13/2020 and showed moderate degenerative disease L4-S1. Her pain in the mid and lower back got much worse and she had difficulty with ambulation due to her mid and lower back pain. Her OTC pain medications and even tramadol were not adequate for pain control and so she came in. She thought is could have been a "bad adjustment" or a kidney stone.  CT renal protocol in ED showed " 1. Diffuse bony metastatic disease with lytic features and associated pathologic fractures in the thoracic, lumbar spine and in the pelvis. 2. Potential soft tissue extension into the central canal at the T10 level. Correlate with symptoms and suggest MRI for further assessment as warranted. 3. Spiculated nodules in the chest with morphologic features that would be more suggestive of bronchogenic neoplasm but given multiplicity could also be seen with metastatic disease. 4. 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 5. Large hepatic masses, at least 10 total lesions. Findings are concerning for metastatic disease. 6. Cystic area in the LEFT adnexa measuring 4.2 x 3.2 cm in axial dimension and 4.0 cm in greatest craniocaudal extent. This is indeterminate, standard follow-up recommendations may not apply in this instance. Normally no follow-up would be recommended, however in the current context could consider follow-up sonogram for further evaluation on a nonemergent basis. 7. Pathologic fracture of the superior endplate on the RIGHT at L4 with endplate concavity, mild endplate concavity at this level. 8.  Nonobstructive LEFT nephrolithiasis.".  ROS - no bowel or bladder incontinence. -Some pain in the left buttocks -no lower extremity loss of sensation or motor weakness. - no headaches. -Does note RUQ abdominal discomfort -no overt chest pain or shortness of breath. -does note good compliance with her tamoxifen -no significant weight loss. -concerned about not having medical  insurance -- is between jobs.  MEDICAL HISTORY:  Past Medical History:  Diagnosis Date  . Arthritis    lower back  . Cancer [facility])    right breast cancer  . Family history of breast cancer   . Personal history of chemotherapy   . Personal history of radiation therapy     SURGICAL HISTORY: Past Surgical History:  Procedure Laterality Date  . ABDOMINAL HYSTERECTOMY    . BREAST LUMPECTOMY Right   . BREAST LUMPECTOMY WITH RADIOACTIVE SEED AND SENTINEL LYMPH NODE BIOPSY Right 02/16/2018   Procedure: RIGHT BREAST LUMPECTOMY WITH RADIOACTIVE SEED AND SENTINEL LYMPH NODE BIOPSY;  Surgeon: [name], MD;  Location: [place];  Service: General;  Laterality: Right;  . HYMENECTOMY    . PORTACATH PLACEMENT Right 02/16/2018   Procedure: INSERTION PORT-A-CATH;  Surgeon: [name], MD;  Location: [place];  Service: General;  Laterality: Right;  . RE-EXCISION OF BREAST LUMPECTOMY Right 02/22/2018   Procedure: RE-EXCISION OF RIGHT  BREAST LUMPECTOMY;  Surgeon: [name], MD;  Location: [place];  Service: General;  Laterality: Right;  . REPAIR VAGINAL CUFF N/A 02/07/2017   Procedure: REPAIR VAGINAL CUFF;  Surgeon: Ena Dawley, MD;  Location: [facility];  Service: Gynecology;  Laterality: N/A;  . ROBOTIC ASSISTED TOTAL HYSTERECTOMY WITH SALPINGECTOMY Left 01/20/2017   Procedure: ROBOTIC ASSISTED TOTAL HYSTERECTOMY WITH SALPINGECTOMY;  Surgeon: [name], MD;  Location: [facility] [facility];  Service: Gynecology;  Laterality: Left;    SOCIAL HISTORY: Social History   Socioeconomic History  . Marital status: Legally Separated    Spouse name: Not on file  . Number of children: Not on file  . Years of education: Not on file  . Highest education level: Not on file  Occupational History  . Not on file  Tobacco Use  . Smoking status: Never Smoker  . Smokeless tobacco: Never Used  Vaping Use  . Vaping Use: Some days  Substance and Sexual Activity   . Alcohol use: Yes    Comment: occ  . Drug use: No  . Sexual activity: Not Currently    Birth control/protection: Surgical  Other Topics Concern  . Not on file  Social History Narrative  . Not on file   Social Determinants of Health   Financial Resource Strain: Not on file  Food Insecurity: Not on file  Transportation Needs: Not on file  Physical Activity: Not on file  Stress: Not on file  Social Connections: Not on file  Intimate Partner Violence: Not on file    FAMILY HISTORY: Family History  Problem Relation Age of Onset  . Lung cancer Maternal Grandfather   . Esophageal cancer Paternal Grandfather 19  . Breast cancer Cousin 31    ALLERGIES:  has No Known Allergies.  MEDICATIONS:  Current Facility-Administered Medications  Medication Dose Route Frequency Provider Last Rate Last Admin  . acetaminophen (TYLENOL) tablet 650 mg  650 mg Oral Q6H PRN Harold Hedge, MD       Or  . acetaminophen (TYLENOL) suppository 650 mg  650 mg Rectal Q6H PRN Harold Hedge, MD      .  dexamethasone (DECADRON) tablet 4 mg  4 mg Oral BID WC Harold Hedge, MD   4 mg at 12/01/20 0950  . enoxaparin (LOVENOX) injection 40 mg  40 mg Subcutaneous Q24H Harold Hedge, MD   40 mg at 11/30/20 2100  . morphine 2 MG/ML injection 2 mg  2 mg Intravenous Q2H PRN Harold Hedge, MD   2 mg at 12/01/20 [id number]  . polyethylene glycol (MIRALAX / GLYCOLAX) packet 17 g  17 g Oral Daily PRN Harold Hedge, MD      . tamoxifen (NOLVADEX) tablet 20 mg  20 mg Oral Daily Harold Hedge, MD   20 mg at 11/30/20 2049  . tiZANidine (ZANAFLEX) tablet 4 mg  4 mg Oral TID PRN Harold Hedge, MD   4 mg at 12/01/20 0950  . traMADol (ULTRAM) tablet 50 mg  50 mg Oral Q6H PRN Harold Hedge, MD   50 mg at 12/01/20 [id number]    REVIEW OF SYSTEMS:    10 Point review of Systems was done is negative except as noted above.  PHYSICAL EXAMINATION: ECOG PERFORMANCE STATUS: 2 - Symptomatic, <50% confined to bed  . Vitals:    11/30/20 2315 12/01/20 0337  BP: 122/74 130/77  Pulse: (!) 59 66  Resp: 18 17  Temp: 97.8 F (36.6 C) 98.2 F (36.8 C)  SpO2: 98% 96%   Filed Weights   11/30/20 1043 11/30/20 1638  Weight: 181 lb (82.1 kg) 183 lb 6.4 oz (83.2 kg)   .Body mass index is 29.6 kg/m.  GENERAL:alert, in mild distress due to back pain and emotional upheaval from new findings. SKIN: no acute rashes, no significant lesions EYES: conjunctiva are pink and non-injected, sclera anicteric OROPHARYNX: MMM, no exudates, no oropharyngeal erythema or ulceration NECK: supple, no JVD LYMPH:  no palpable lymphadenopathy in the cervical, axillary or inguinal regions LUNGS: clear to auscultation b/l with normal respiratory effort HEART: regular rate & rhythm ABDOMEN:  normoactive bowel sounds , non tender, not distended. Extremity: no pedal edema PSYCH: alert & oriented x 3 with fluent speech NEURO: no focal motor/sensory deficits  LABORATORY DATA:  I have reviewed the data as listed  . CBC Latest Ref Rng & Units 12/01/2020 11/30/2020 06/20/2020  WBC 4.0 - 10.5 K/uL 7.9 8.7 6.3  Hemoglobin 12.0 - 15.0 g/dL 11.8(L) 11.9(L) 13.0  Hematocrit 36.0 - 46.0 % 36.9 36.6 40.5  Platelets 150 - 400 K/uL 259 253 230    . CMP Latest Ref Rng & Units 12/01/2020 11/30/2020 06/20/2020  Glucose 70 - 99 mg/dL 93 94 95  BUN 6 - 20 mg/dL 11 16 11   Creatinine 0.44 - 1.00 mg/dL 0.74 0.80 0.81  Sodium 135 - 145 mmol/L 141 137 139  Potassium 3.5 - 5.1 mmol/L 4.3 3.9 4.0  Chloride 98 - 111 mmol/L 107 105 104  CO2 22 - 32 mmol/L 29 25 26   Calcium 8.9 - 10.3 mg/dL 9.6 9.6 9.5  Total Protein 6.5 - 8.1 g/dL - 6.9 7.1  Total Bilirubin 0.3 - 1.2 mg/dL - 0.4 0.5  Alkaline Phos 38 - 126 U/L - 85 40  AST 15 - 41 U/L - 39 19  ALT 0 - 44 U/L - 34 14     RADIOGRAPHIC STUDIES: I have personally reviewed the radiological images as listed and agreed with the findings in the report.  CT Renal Stone Study  Addendum Date: 11/30/2020    ADDENDUM REPORT: 11/30/2020 13:49 ADDENDUM: These results were called by  telephone at the time of interpretation on 11/30/2020 at 1:48 pm to provider Sherol Dade , who verbally acknowledged these results. Electronically Signed   By: Zetta Bills M.D.   On: 11/30/2020 13:49   Result Date: 11/30/2020 CLINICAL DATA:  RIGHT flank pain, kidney stone suspected. EXAM: CT ABDOMEN AND PELVIS WITHOUT CONTRAST TECHNIQUE: Multidetector CT imaging of the abdomen and pelvis was performed following the standard protocol without IV contrast. COMPARISON:  Only chest x-rays are available for comparison. FINDINGS: Lower chest: Spiculated nodular area in the RIGHT lower lobe (image 9/4) 1.6 x 1.5 cm. Septal thickening extending to the periphery of the RIGHT chest in the RIGHT middle lobe. Additional spiculated nodule 12 mm (image 44/4) RIGHT lower lobe. Smaller nodule at the periphery measuring 3-4 mm. No effusion. No consolidation. Hepatobiliary: Large hepatic masses. Largest in the RIGHT lobe (image 25/2 5.8 x 5.6 cm. Central hepatic mass (image 32/2 4.5 x 3.5 cm. LEFT lobe lesion (image 42/22.4 cm. Additional lesions scattered throughout the liver, at least 10 total lesions. No pericholecystic stranding. Pancreas: Pancreas with normal contour.  No sign of inflammation. Spleen: Spleen normal in size and contour. Adrenals/Urinary Tract: Adrenal glands are normal. Nephrolithiasis in the lower pole of the LEFT kidney 3 mm (image 50/2) smooth renal contours. No hydronephrosis. No perinephric stranding. Urinary bladder under distended. Stomach/Bowel: Stomach is unremarkable. Small bowel normal caliber. Appendix is normal. No acute colonic process. Vascular/Lymphatic: Normal caliber of the abdominal aorta and IVC. There is no gastrohepatic or hepatoduodenal ligament lymphadenopathy. No retroperitoneal or mesenteric lymphadenopathy. No pelvic sidewall lymphadenopathy. Reproductive: Post hysterectomy. Cystic area in the LEFT  adnexa measuring 4.2 x 3.2 cm in axial dimension and 4.0 cm in greatest craniocaudal extent. Other: No ascites.  No free air. Musculoskeletal: Diffuse bony metastatic disease with lytic features. For instance, in the LEFT posterior iliac with destructive features on image 65 of series 2 is a 3.1 cm lytic focus.a this is associated with pathologic fracture of the posterior LEFT iliac. Lytic lesions are seen to varying degrees throughout the thoracic and the lumbar spine nearly every level of the spine is involved. Also with a large sacral metastasis on image 63 of series 2 measuring 3.6 x 3.6 cm. Mild loss of height due to pathologic fracture at the T9 vertebral level approximately 10-15% loss of height. Lytic metastases at the remaining levels of the thoracic spine with mild loss of height at T12 as well likely due to pathologic endplate fracture. At the T10 level there is suggestion of soft tissue extending posteriorly into the central canal, difficult to determine on noncontrast evaluation but with posterior cortical destruction and potential extension is much as 7 mm into the central canal posterior to the T10 level. Multilevel metastatic disease in the lumbar spine as well largest lesion approximately 1.5 cm in the L3 vertebral level. Pathologic fracture of the superior endplate on the RIGHT at L4 with endplate concavity, mild endplate concavity at this level. IMPRESSION: 1. Diffuse bony metastatic disease with lytic features and associated pathologic fractures in the thoracic, lumbar spine and in the pelvis. 2. Potential soft tissue extension into the central canal at the T10 level. Correlate with symptoms and suggest MRI for further assessment as warranted. 3. Spiculated nodules in the chest with morphologic features that would be more suggestive of bronchogenic neoplasm but given multiplicity could also be seen with metastatic disease. 4. 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 5. Large hepatic masses, at  least 10 total lesions. Findings are  concerning for metastatic disease. 6. Cystic area in the LEFT adnexa measuring 4.2 x 3.2 cm in axial dimension and 4.0 cm in greatest craniocaudal extent. This is indeterminate, standard follow-up recommendations may not apply in this instance. Normally no follow-up would be recommended, however in the current context could consider follow-up sonogram for further evaluation on a nonemergent basis. 7. Pathologic fracture of the superior endplate on the RIGHT at L4 with endplate concavity, mild endplate concavity at this level. 8. Nonobstructive LEFT nephrolithiasis. 9. Aortic atherosclerosis. Electronically Signed: By: Zetta Bills M.D. On: 11/30/2020 13:16    ASSESSMENT & PLAN:   Patient is a wonderful 50 yo female with H/o Triple Positive Breast cancer Stage IB diagnosed in 02/2018 now with concern for metastatic disease in the bones, liver and lungs.  1) Likely Metastatic triple positive breast cancer with bone, liver and lung metastases. 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 She is s/p lumpectomy with SNB on 02/16/2018 showing Stage IB disease,grade 3 with a positive inferior margin. She had additional surgery on 02/27/2018.  She was treated with Carboplatin, docetaxel, trastuzumab and pertuzumab 03/11/2018, repeated every 21 days x 6, last dose 07/18/2018.                Pertuzumab omitted after cycle 1 due to diarrhea.              Gemcitabine substituted for docetaxel starting with cycle 5 due to peripheral neuropathy             continued trastuzumab to total 6 months (through February 2020).   adjuvant radiation 08/15/2018 - 09/28/2018   started tamoxifen March 2020              the patient is status post hysterectomy, without bilateral salpingo-oophorectomy             FSH and estradiol levels June 2020 show she is pre menopausal  PLAN --Would recommend CT chest abdomen pelvis with IV contrast for initial staging of metastatic malignancy likely with breast primary. -MRI cervical lumbar and thoracic spine to evaluate for actionable disease with regards to possible impending cord compression and to define areas that might need to be addressed with radiation or vertebroplasty for pain management. -MRI brain to complete staging. -Tumor markers including CA 125, CA 27 29 and CA 15-3 have been ordered -Please admit to hospital medicine for pain management and as primary inpatient service. -Would recommend consideration of dexamethasone 4 mg with breakfast and lunch for pain control in addition to other pain medications. -IR consult forbx of liver lesion for tissue diagnosis and molecular testing.  Dr. Jana Hakim will follow up on Monday.  All of the patients questions were answered with apparent satisfaction. The patient knows to call the clinic with any problems, questions or concerns.  I spent 60 minutes counseling the patient face to face. The total time spent in the appointment was 80 minutes and more than 50% was on counseling and direct patient cares, co-ordination of cares with ED and hospital medicine.    Sullivan Lone MD [place] AAHIVMS [facility] Hematology/Oncology Physician [facility]  (Office):       [phone] (Work cell):  [phone] (Fax):  [phone]      

## 2020-12-01 NOTE — Plan of Care (Signed)
Care plan initiated. Kjones RN 

## 2020-12-01 NOTE — Evaluation (Signed)
Physical Therapy One Time Evaluation Patient Details Name: Monica [name] MRN: [id number] DOB: 06/23/1971 Today's Date: 12/01/2020   History of Present Illness  50 year old female with past medical history of triple positive right sided invasive ductal carcinoma breast cancer s/p right lumpectomy and currently on tamoxifen (follows with Dr. Jana Hakim) and admitted for Acute on chronic back pain secondary to newly found diffuse bony metastatic disease and L4 pathologic fracture with history of right sided breast cancer and Widespread lesions concerning for newly found metastases with history of triple positive invasive ductal carcinoma of the right breast  Clinical Impression  Patient evaluated by Physical Therapy with no further acute PT needs identified. All education has been completed and the patient has no further questions.  Pt reports right groin pain and states she feels it may be due to an overstretch injury from yoga.  Pt encouraged to only ambulate for exercise at this time. Pt plans to obtain RW, BSC, and tub bench for her apartment bathroom on her own due to being in-between insurance coverage.  Pt agreeable to ambulate with RW and staff or significant other during acute stay. See below for any follow-up Physical Therapy or equipment needs. PT is signing off. Thank you for this referral.     Follow Up Recommendations No PT follow up    Equipment Recommendations  Rolling walker with 5" wheels;3in1 (PT) (pt plans to obtain these)    Recommendations for Other Services       Precautions / Restrictions Precautions Precautions: Back Precaution Comments: for comfort      Mobility  Bed Mobility Overal bed mobility: Modified Independent             General bed mobility comments: pt already performing log roll technique    Transfers Overall transfer level: Needs assistance Equipment used: Rolling walker (2 wheeled) Transfers: Sit to/from Stand Sit to Stand: Supervision             Ambulation/Gait Ambulation/Gait assistance: Supervision Gait Distance (Feet): 350 Feet Assistive device: Rolling walker (2 wheeled) Gait Pattern/deviations: Step-through pattern;Decreased stride length     General Gait Details: steady with RW, reports right groin pain with ambulating and therefore using RW for pain relief  Stairs            Wheelchair Mobility    Modified Rankin (Stroke Patients Only)       Balance                                             Pertinent Vitals/Pain Pain Assessment: Faces Faces Pain Scale: Hurts even more Pain Location: c/o pain in right groin Pain Descriptors / Indicators: Sore Pain Intervention(s): RN gave pain meds during session;Repositioned;Monitored during session    Home Living Family/patient expects to be discharged to:: Private residence Living Arrangements: Alone Available Help at Discharge: Family;Available PRN/intermittently (significant other) Type of Home: Apartment       Home Layout: One level Home Equipment: None      Prior Function Level of Independence: Independent         Comments: still works     Journalist, newspaper        Extremity/Trunk Assessment        Lower Extremity Assessment Lower Extremity Assessment: Generalized weakness;RLE deficits/detail RLE Deficits / Details: reports groin pain with movement and ambulating, pt reports she feels as though she "overstretched" muscle  during yoga       Communication   Communication: No difficulties  Cognition Arousal/Alertness: Awake/alert Behavior During Therapy: WFL for tasks assessed/performed Overall Cognitive Status: Within Functional Limits for tasks assessed                                        General Comments      Exercises     Assessment/Plan    PT Assessment Patent does not need any further PT services  PT Problem List         PT Treatment Interventions      PT Goals (Current  goals can be found in the Care Plan section)  Acute Rehab PT Goals PT Goal Formulation: All assessment and education complete, DC therapy    Frequency     Barriers to discharge        Co-evaluation               AM-PAC PT "6 Clicks" Mobility  Outcome Measure Help needed turning from your back to your side while in a flat bed without using bedrails?: None Help needed moving from lying on your back to sitting on the side of a flat bed without using bedrails?: None Help needed moving to and from a bed to a chair (including a wheelchair)?: A Little Help needed standing up from a chair using your arms (e.g., wheelchair or bedside chair)?: A Little Help needed to walk in hospital room?: A Little Help needed climbing 3-5 steps with a railing? : A Little 6 Click Score: 20    End of Session   Activity Tolerance: Patient tolerated treatment well Patient left: in chair;with call bell/phone within reach;with family/visitor present;with nursing/sitter in room Nurse Communication: Mobility status      Time: 1829-9371 PT Time Calculation (min) (ACUTE ONLY): 22 min   Charges:   PT Evaluation $PT Eval Low Complexity: 1 Low         Kati PT, DPT Acute Rehabilitation Services Pager: [phone] Office: [place] 12/01/2020, 12:41 PM

## 2020-12-01 NOTE — Consult Note (Signed)
Consultation Note Date: 12/01/2020   Patient Name: Monica [name]  DOB: November 05, 1970  MRN: [id number]  Age / Sex: 50 y.o., female  PCP: Monica Stalker, PA-C Referring Physician: Tawni [name]  Reason for Consultation: Establishing goals of care and Pain control  HPI/Patient Profile: 50 y.o. 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   Clinical Assessment and Goals of Care: I met today with Monica [name]. She is awake, alert and in good spirits. She has good understanding of concern for metastatic disease and is processing this information. She shares about her experience with breast cancer treatment with 8 months of chemotherapy and how she learned different ways to cope and what to expect and living her life around her good days. She acknowledges that this will be a different experience but she does find some comfort in knowing more about what to expect since she has been through cancer treatment previously. 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 She notes that she has supportive friends and family. She is hopeful to pursue further testing/biopsy and gain more information to develop treatment plan.   We discussed her pain and she notes history of right rib/flank pain previously but this has moved more to her mid back with severe deep aching/knotting pain bilaterally around ~T9 area. She has known degenerative disc disorder so she was pursuing chiropractor support and back brace and even potential kidney stone treatment until pain became debilitating triggering this hospitalization. She reports good  relief from steroids and especially combination of steroids, muscle relaxer, and tramadol. She reports no relief with morphine. We discussed potential for radiation therapy options. She also has some pain in groin muscle that seemed to be pulled/strained trying to stretch. Overall her functional status is much improved on current regimen. She does awaken in early hours of morning ~3am in pain and we will add Lidoderm patch at night to try and assist. She has not had BM since Friday so I will add senokot. She denies headaches, vision changes, confusion. She does note on occasion she will forget the word she is trying to say but difficult to determine if this is from being overwhelmed with everything going on or related to brain mets.   All questions/concerns addressed. Emotional support provided.   Primary Decision Maker PATIENT    SUMMARY OF RECOMMENDATIONS   - Focus on introduction and support and symptom management today - Will follow up tomorrow for further discussion and consideration of advance directives  Code Status/Advance Care Planning:  Full code   Symptom Management:   Back pain:  Good relief from steroids, muscle relaxer, tramadol.   Adding Lidoderm patch.   As needed dilaudid - if this is effective could consider transition to po  regimen.   Bowel Regimen: Senokot daily.   Palliative Prophylaxis:   Bowel Regimen and Frequent Pain Assessment  Additional Recommendations (Limitations, Scope, Preferences):  Full Scope Treatment  Prognosis:   Overall poor with newly discovered metastatic cancer. Prognosis determinant on response to treatment.   Discharge Planning: To Be Determined      Primary Diagnoses: Present on Admission: . Cancer related pain   I have reviewed the medical record, interviewed the patient and family, and examined the patient. The following aspects are pertinent.  Past Medical History:  Diagnosis Date  . Arthritis    lower back  .  Cancer [facility])    right breast cancer  . Family history of breast cancer   . Personal history of chemotherapy   . Personal history of radiation therapy    Social History   Socioeconomic History  . Marital status: Legally Separated    Spouse name: Not on file  . Number of children: Not on file  . Years of education: Not on file  . Highest education level: Not on file  Occupational History  . Not on file  Tobacco Use  . Smoking status: Never Smoker  . Smokeless tobacco: Never Used  Vaping Use  . Vaping Use: Some days  Substance and Sexual Activity  . Alcohol use: Yes    Comment: occ  . Drug use: No  . Sexual activity: Not Currently    Birth control/protection: Surgical  Other Topics Concern  . Not on file  Social History Narrative  . Not on file   Social Determinants of Health   Financial Resource Strain: Not on file  Food Insecurity: Not on file  Transportation Needs: Not on file  Physical Activity: Not on file  Stress: Not on file  Social Connections: Not on file   Family History  Problem Relation Age of Onset  . Lung cancer Maternal Grandfather   . Esophageal cancer Paternal Grandfather 35  . Breast cancer Cousin 31   Scheduled Meds: . dexamethasone  4 mg Oral BID WC  . enoxaparin (LOVENOX) injection  40 mg Subcutaneous Q24H  . lidocaine  1 patch Transdermal Q24H  . pantoprazole  40 mg Oral Daily  . senna  1 tablet Oral Daily  . tamoxifen  20 mg Oral Daily   Continuous Infusions: PRN Meds:.acetaminophen **OR** acetaminophen, HYDROmorphone (DILAUDID) injection, polyethylene glycol, tiZANidine, traMADol No Known Allergies Review of Systems  Constitutional: Positive for activity change. Negative for appetite change.  Respiratory: Negative for shortness of breath.   Musculoskeletal: Positive for back pain.  Neurological: Positive for weakness.  Psychiatric/Behavioral: Negative for confusion.    Physical Exam Vitals and nursing note reviewed.   Constitutional:      General: She is not in acute distress.    Appearance: Normal appearance. She is well-developed and well-groomed.  Cardiovascular:     Rate and Rhythm: Normal rate.  Pulmonary:     Effort: Pulmonary effort is normal. No tachypnea, accessory muscle usage or respiratory distress.  Abdominal:     General: Abdomen is flat.  Neurological:     Mental Status: She is alert and oriented to person, place, and time.     Vital Signs: BP (!) 119/93 (BP Location: Left Arm)   Pulse 64   Temp 98.4 F (36.9 C) (Oral)   Resp 17   Ht 5' 6" (1.676 m)   Wt 83.2 kg   LMP 12/21/2016 (Exact Date)   SpO2 97%   BMI 29.60 kg/m  Pain  Scale: 0-10   Pain Score: 6    SpO2: SpO2: 97 % O2 Device:SpO2: 97 % O2 Flow Rate: .   IO: Intake/output summary: No intake or output data in the 24 hours ending 12/01/20 1625  LBM:   Baseline Weight: Weight: 82.1 kg Most recent weight: Weight: 83.2 kg     Palliative Assessment/Data:     Time In: 1500 Time Out: 1630 Time Total: 90 min Greater than 50%  of this time was spent counseling and coordinating care related to the above assessment and plan.  Signed by: Vinie Sill, NP Palliative Medicine Team Pager # [phone] (M-F 8a-5p) Team Phone # [phone] (Nights/Weekends)

## 2020-12-02 ENCOUNTER — Ambulatory Visit (HOSPITAL_COMMUNITY)
Admit: 2020-12-02 | Discharge: 2020-12-02 | Disposition: A | Discharge status: Home / Self Care | Payer: Self-pay | Attending: Radiation Oncology | Admitting: Radiation Oncology

## 2020-12-02 ENCOUNTER — Other Ambulatory Visit: Payer: Self-pay | Admitting: Neurological Surgery

## 2020-12-02 ENCOUNTER — Inpatient Hospital Stay (HOSPITAL_COMMUNITY): Payer: Self-pay

## 2020-12-02 ENCOUNTER — Ambulatory Visit
Admit: 2020-12-02 | Discharge: 2020-12-02 | Disposition: A | Discharge status: Home / Self Care | Payer: No Typology Code available for payment source | Attending: Radiation Oncology | Admitting: Radiation Oncology

## 2020-12-02 DIAGNOSIS — R109 Unspecified abdominal pain: Secondary | ICD-10-CM

## 2020-12-02 DIAGNOSIS — C799 Secondary malignant neoplasm of unspecified site: Secondary | ICD-10-CM

## 2020-12-02 DIAGNOSIS — M549 Dorsalgia, unspecified: Secondary | ICD-10-CM

## 2020-12-02 DIAGNOSIS — T402X5A Adverse effect of other opioids, initial encounter: Secondary | ICD-10-CM

## 2020-12-02 DIAGNOSIS — L299 Pruritus, unspecified: Secondary | ICD-10-CM

## 2020-12-02 HISTORY — PX: IR US GUIDE BX ASP/DRAIN: IMG2392

## 2020-12-02 LAB — CBC
HCT: 37.1 % (ref 36.0–46.0)
Hemoglobin: 12.1 g/dL (ref 12.0–15.0)
MCH: 30.1 pg (ref 26.0–34.0)
MCHC: 32.6 g/dL (ref 30.0–36.0)
MCV: 92.3 fL (ref 80.0–100.0)
Platelets: 274 10*3/uL (ref 150–400)
RBC: 4.02 MIL/uL (ref 3.87–5.11)
RDW: 11.6 % (ref 11.5–15.5)
WBC: 12.2 10*3/uL — ABNORMAL HIGH (ref 4.0–10.5)
nRBC: 0 % (ref 0.0–0.2)

## 2020-12-02 LAB — PROTIME-INR
INR: 1 (ref 0.8–1.2)
Prothrombin Time: 13 seconds (ref 11.4–15.2)

## 2020-12-02 IMAGING — US IR US GUIDANCE
1 series · 5 of 5 positions shown · non-contrast
Comparison: none

INDICATION: Liver lesions, suspect metastatic breast cancer

[Series 1: ir (id) (id)/(id)/(id) · 5 of 5 slices shown]
[im 1/5]
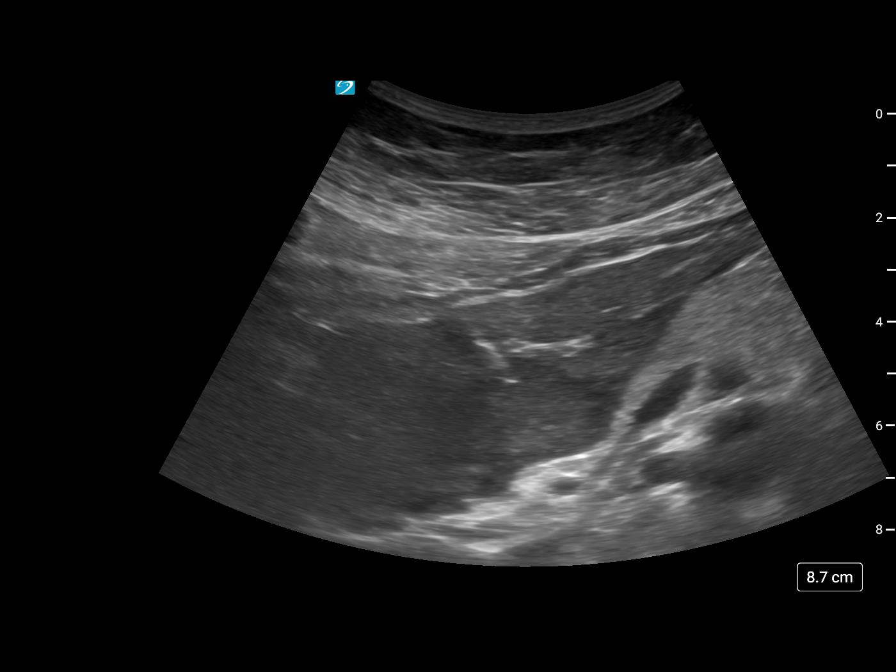
[im 2/5]
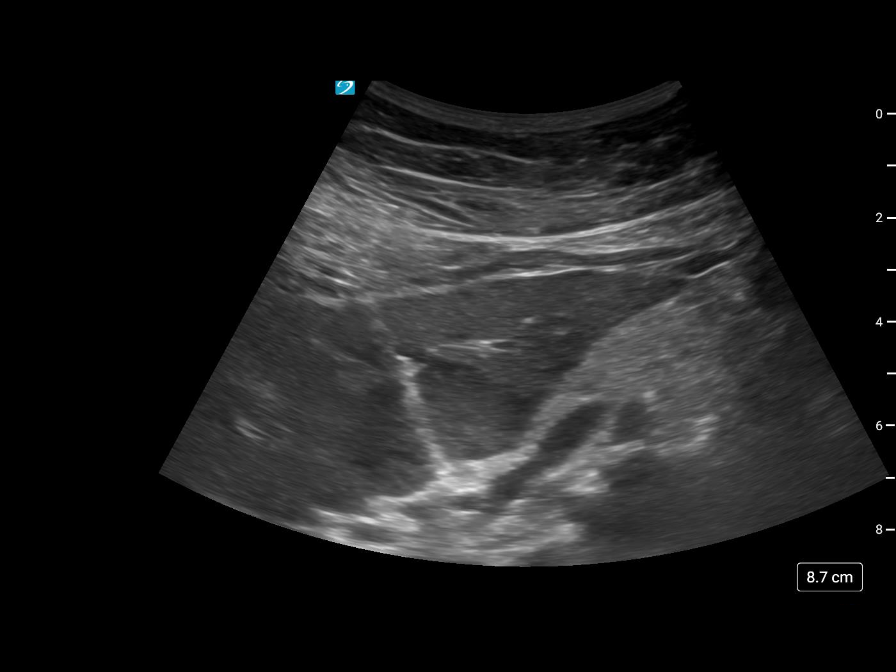
[im 3/5]
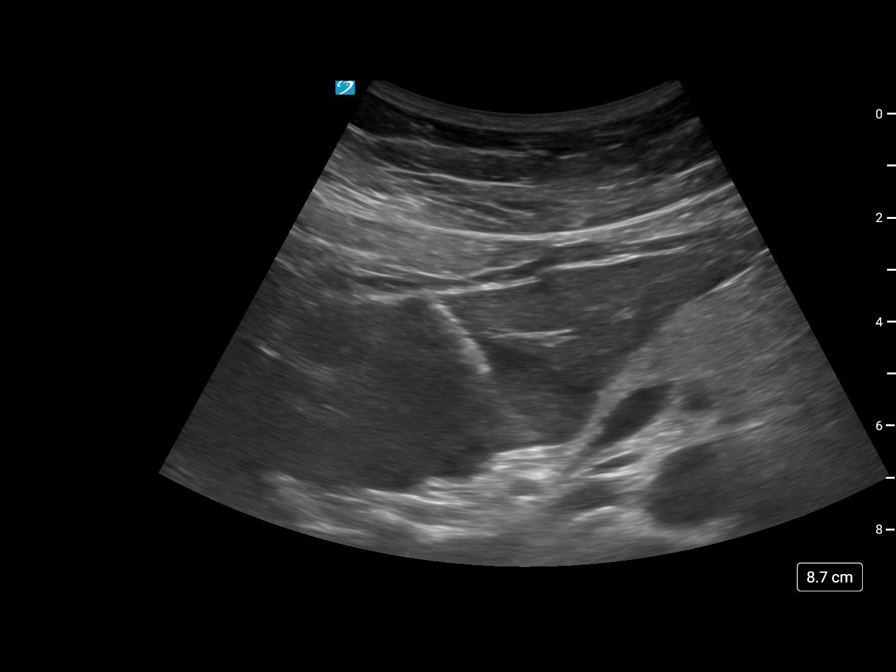
[im 4/5]
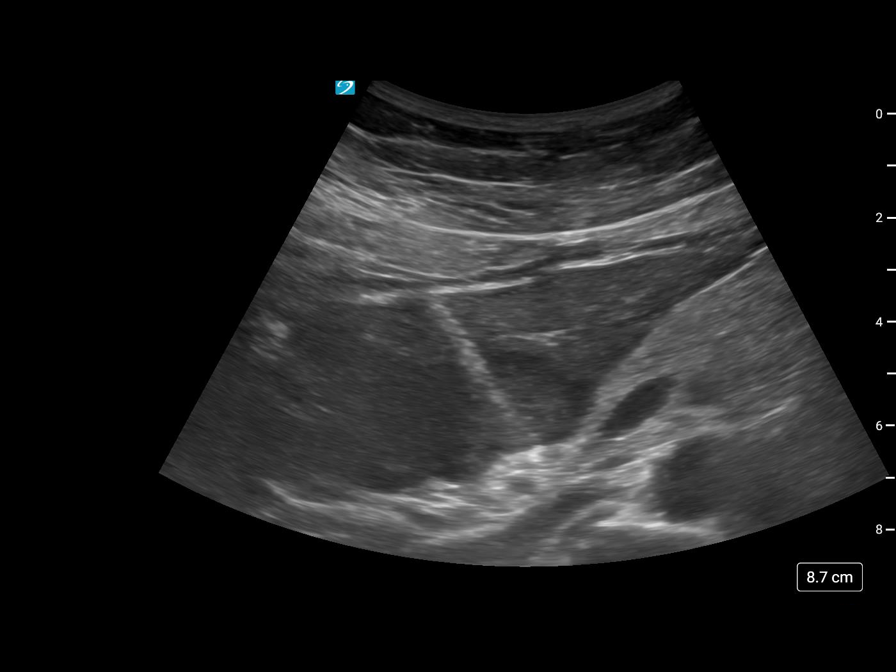
[im 5/5]
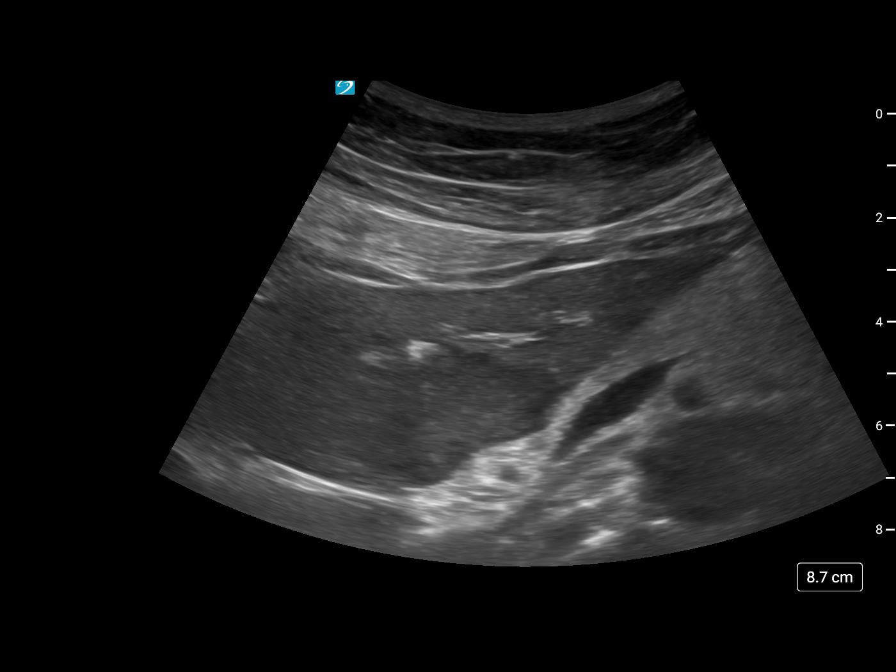

[5 of 5 positions shown; findings below may reference images not displayed]

EXAM:
ULTRASOUND LEFT LIVER METASTASIS 18 GAUGE CORE BIOPSY

MEDICATIONS:
1% LIDOCAINE LOCAL

ANESTHESIA/SEDATION:
Moderate (conscious) sedation was employed during this procedure. A
total of Versed 2.0 mg and Fentanyl 100 mcg was administered
intravenously.

Moderate Sedation Time: 10 minutes. The patient's level of
consciousness and vital signs were monitored continuously by
radiology nursing throughout the procedure under my direct
supervision.

FLUOROSCOPY TIME:  Fluoroscopy Time: None.

COMPLICATIONS:
None immediate.

PROCEDURE:
Informed written consent was obtained from the patient after a
thorough discussion of the procedural risks, benefits and
alternatives. All questions were addressed. Maximal Sterile Barrier
Technique was utilized including caps, mask, sterile gowns, sterile
gloves, sterile drape, hand hygiene and skin antiseptic. A timeout
was performed prior to the initiation of the procedure.

Previous imaging reviewed. Preliminary ultrasound performed. A left
hepatic lobe hypoechoic solid lesion was localized and marked.

Under sterile conditions and local anesthesia, a 17 gauge coaxial
guide was advanced to the lesion. Needle position confirmed with
ultrasound. Images obtained for documentation. Through the access,
18 gauge core biopsies obtained. These were placed in formalin.
Sampling was intact and non fragmented. Needle tract occluded with
Gel-Foam. No immediate complication. Postprocedure imaging
demonstrates no hemorrhage or hematoma.
IMPRESSION: Successful ultrasound left hepatic metastasis 18 gauge core biopsy

## 2020-12-02 IMAGING — MR MR HEAD WO/W CM
11 of 14 series · 23 of 48 positions shown · IV contrast (8 ML gad)
Comparison: [DATE]

CLINICAL DATA: Metastatic breast cancer

EXAM:
MRI HEAD WITHOUT AND WITH CONTRAST
TECHNIQUE: Multiplanar, multiecho pulse sequences of the brain and surrounding
structures were obtained without and with intravenous contrast.
CONTRAST:  8mL GADAVIST GADOBUTROL 1 MMOL/ML IV SOLN

[Series 2: FLAIR · sagittal · 3.0mm · 0.47mm/px · 2 of 43 slices shown (1 of 2)]
[im 1/43]
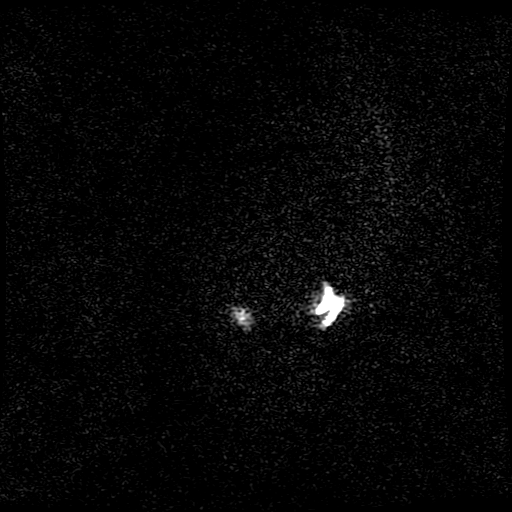
[im 43/43]
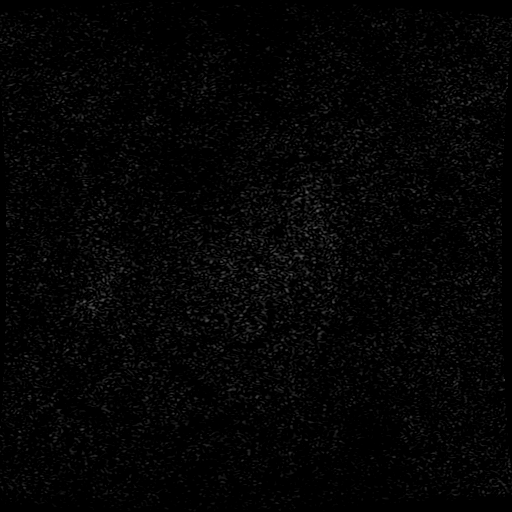

[Series 3: DWI · axial · 3.0mm · 0.94mm/px · z∈[-80,+109]mm · 3 of 128 slices shown]
[im 1/128]
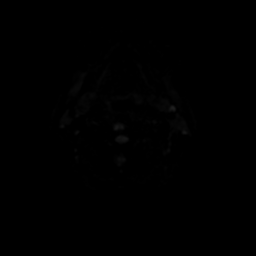
[im 64/128]
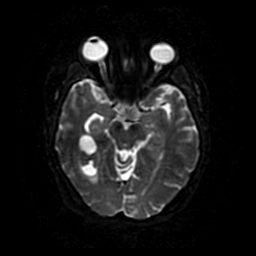
[im 128/128]
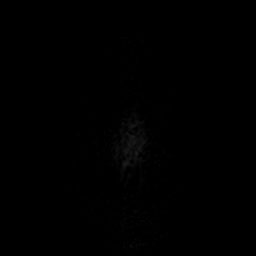

[Series 4: FLAIR · axial · 3.0mm · 0.47mm/px · 1 of 62 slices shown (2 of 2)]
[im 1/62]
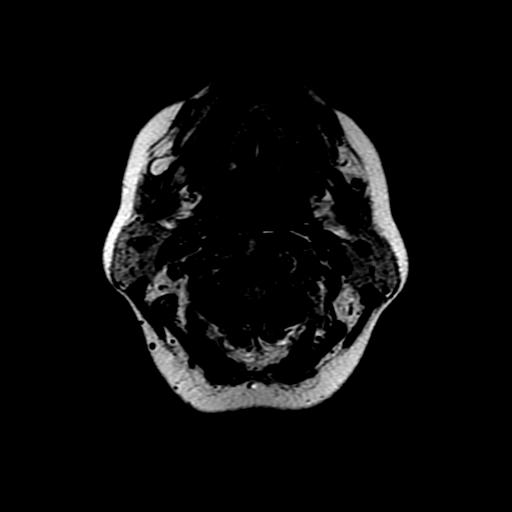

[Series 5: SWI · axial · 3.0mm · 0.47mm/px · z∈[-58,+114]mm · 3 of 116 slices shown (1 of 2)]
[im 1/116]
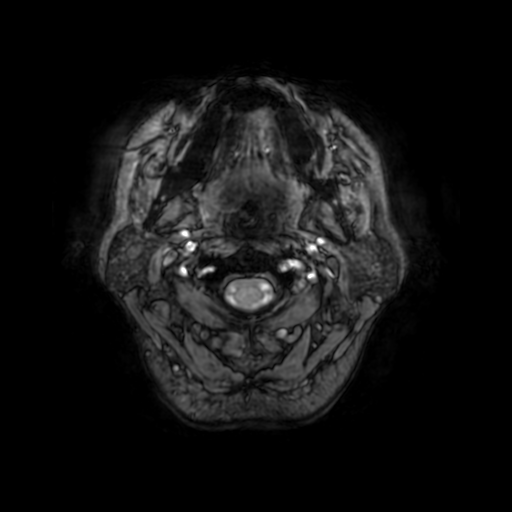
[im 58/116]
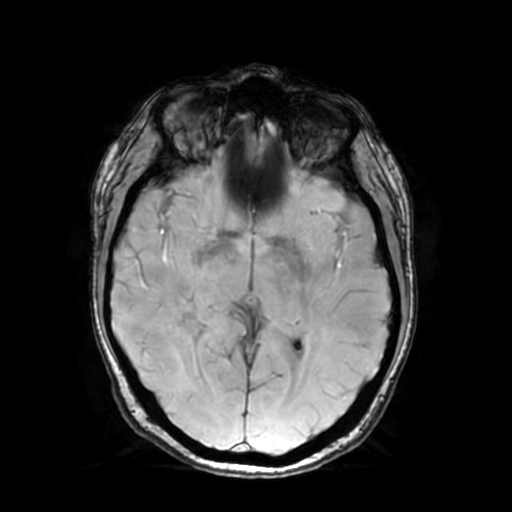
[im 116/116]
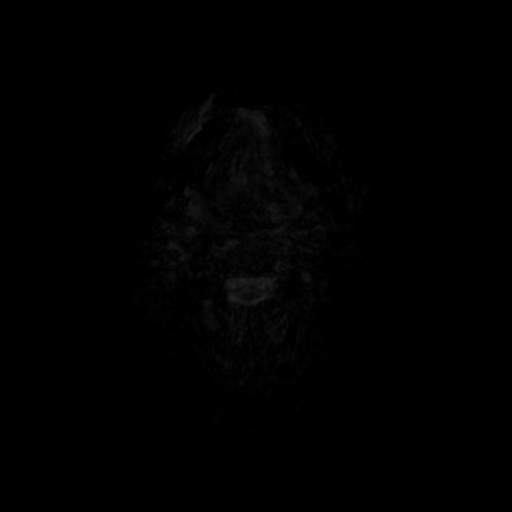

[Series 7: T2 post-contrast · coronal · 3.0mm · 0.39mm/px · 1 of 56 slices shown (1 of 2)]
[im 1/56]
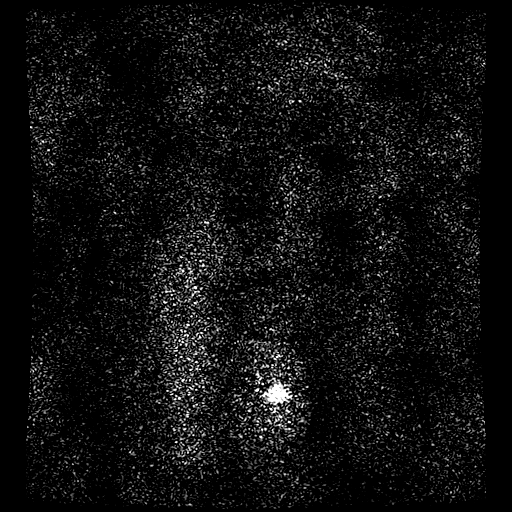

[Series 8: T2 post-contrast · axial · 5.0mm · 0.47mm/px · 1 of 32 slices shown (2 of 2)]
[im 1/32]
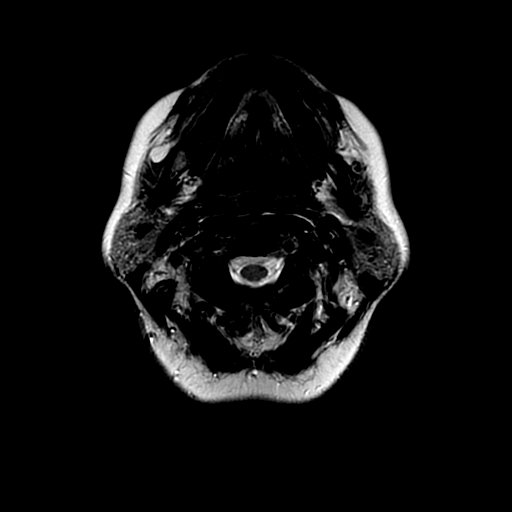

[Series 9: T1 post-contrast · coronal · 3.0mm · 0.39mm/px · 1 of 56 slices shown (1 of 2)]
[im 1/56]
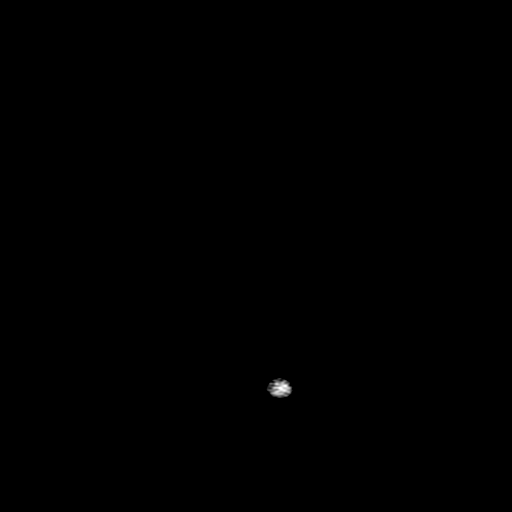

[Series 10: FLAIR post-contrast · sagittal · 3.0mm · 0.47mm/px · 1 of 43 slices shown]
[im 1/43]
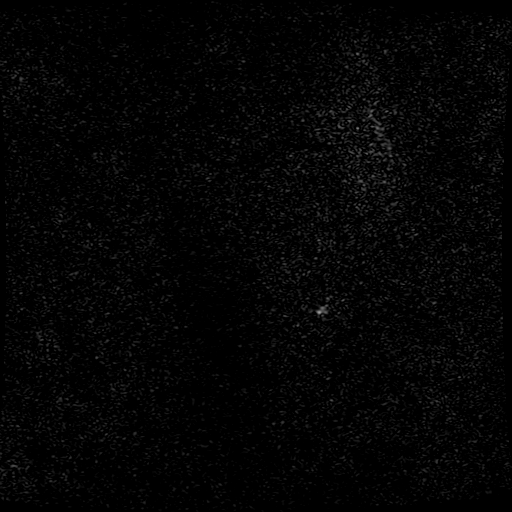

[Series 350: ADC · axial · 3.0mm · 0.94mm/px · z∈[-80,+109]mm · 2 of 64 slices shown]
[im 1/64]
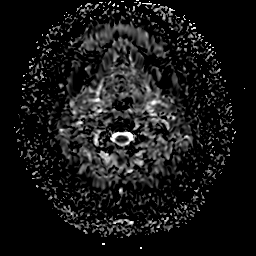
[im 64/64]
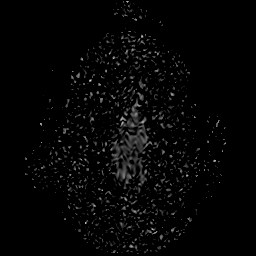

[Series 500: SWI · axial · 3.0mm · 0.47mm/px · 1 of 115 slices shown (2 of 2)]
[im 1/115]
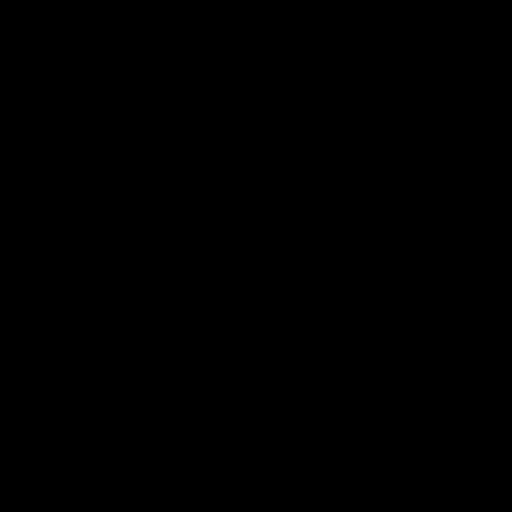

[Series 1100: T1 post-contrast · axial · 0.9mm · 0.50mm/px · z∈[-122,+133]mm · 7 of 301 slices shown (2 of 2)]
[im 1/301]
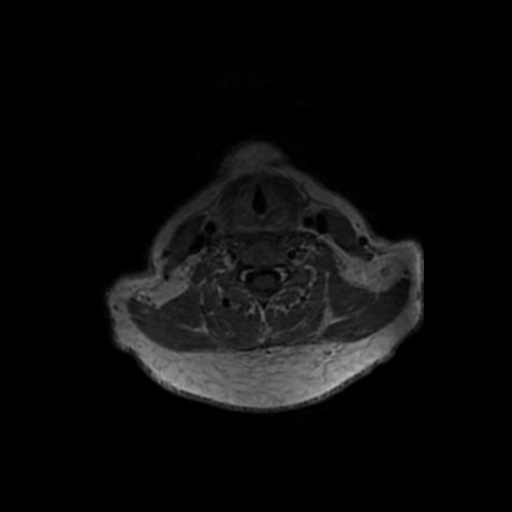
[im 51/301]
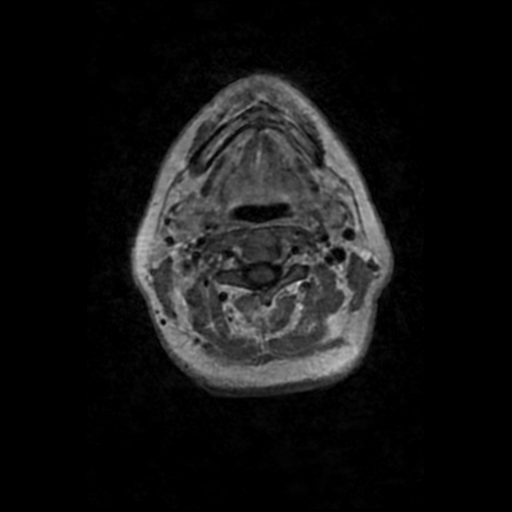
[im 101/301]
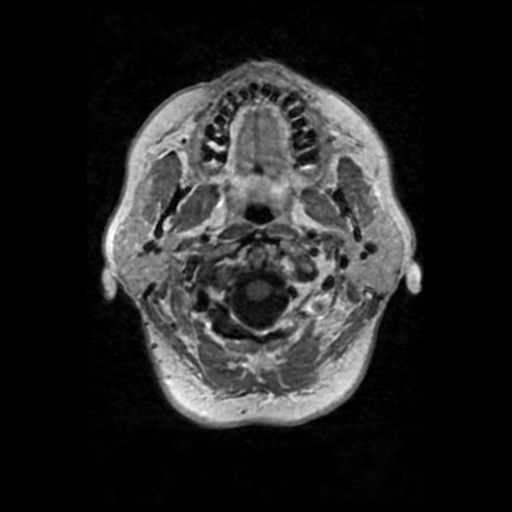
[im 151/301]
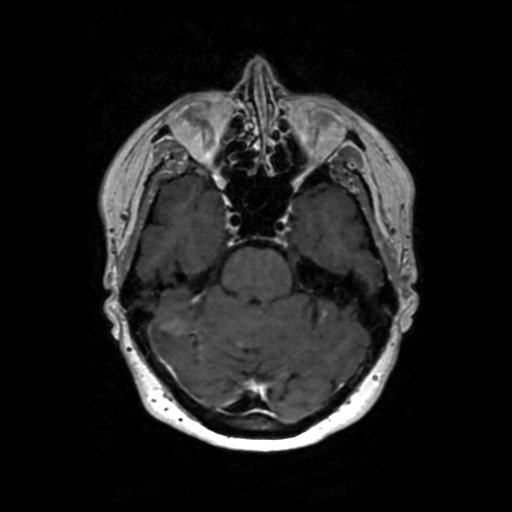
[im 201/301]
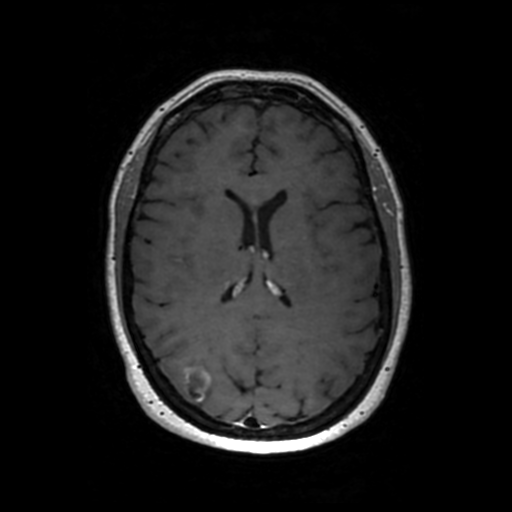
[im 251/301]
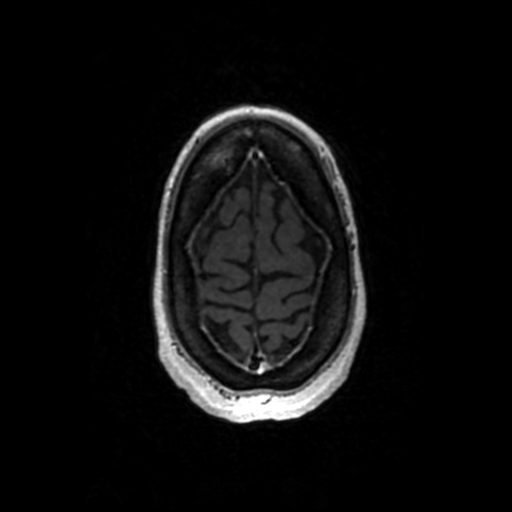
[im 301/301]
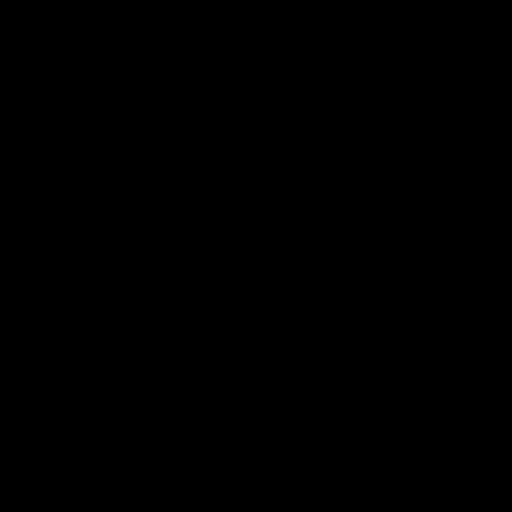

[23 of 48 positions shown; findings below may reference images not displayed]

FINDINGS: Brain: Over the short interval, no change in multiple metastatic
lesions identified on the prior study. Additional lesions are
identified, some of which may be artifactual. Enhancing foci
identified on series [TE], images 238, 231, 225, 199, 187, 166, 150,
135.

There is no acute infarction or new hemorrhage. No worsening of mass
effect. No hydrocephalus.

Vascular: Major vessel flow voids at the skull base are preserved.

Skull and upper cervical spine: Patchy areas of low T1 marrow signal
in the calvarium and visualized upper cervical spine reflecting
metastatic disease.

Sinuses/Orbits: Paranasal sinuses are aerated. Orbits are
unremarkable.

Other: Sella is unremarkable.  Mastoid air cells are clear.
IMPRESSION: Multiple brain metastases as detailed above and annotated on series

## 2020-12-02 MED ORDER — MIDAZOLAM HCL 2 MG/2ML IJ SOLN
INTRAMUSCULAR | Status: AC | PRN
  Administered 2020-12-02: 1 mg via INTRAVENOUS

## 2020-12-02 MED ORDER — FENTANYL CITRATE (PF) 100 MCG/2ML IJ SOLN
INTRAMUSCULAR | Status: AC | PRN
  Administered 2020-12-02: 50 ug via INTRAVENOUS

## 2020-12-02 MED ORDER — MIDAZOLAM HCL 2 MG/2ML IJ SOLN
INTRAMUSCULAR | Status: AC
  Filled 2020-12-02: qty 4

## 2020-12-02 MED ORDER — GELATIN ABSORBABLE 12-7 MM EX MISC
CUTANEOUS | Status: AC | PRN
Start: 2020-12-02 — End: 2020-12-02
  Administered 2020-12-02: 1

## 2020-12-02 MED ORDER — FENTANYL CITRATE (PF) 100 MCG/2ML IJ SOLN
INTRAMUSCULAR | Status: AC
  Filled 2020-12-02: qty 2

## 2020-12-02 MED ORDER — GADOBUTROL 1 MMOL/ML IV SOLN
8.0000 mL | Freq: Once | INTRAVENOUS | Status: AC | PRN
  Administered 2020-12-02: 8 mL via INTRAVENOUS

## 2020-12-02 MED ORDER — LIDOCAINE-EPINEPHRINE 1 %-1:100000 IJ SOLN
INTRAMUSCULAR | Status: AC
  Filled 2020-12-02: qty 1

## 2020-12-02 MED ORDER — LACTATED RINGERS IV BOLUS
1000.0000 mL | Freq: Once | INTRAVENOUS | Status: AC
  Administered 2020-12-02: 1000 mL via INTRAVENOUS

## 2020-12-02 MED ORDER — GELATIN ABSORBABLE 12-7 MM EX MISC
CUTANEOUS | Status: AC
  Filled 2020-12-02: qty 1

## 2020-12-02 MED ORDER — LIDOCAINE-EPINEPHRINE 1 %-1:100000 IJ SOLN
INTRAMUSCULAR | Status: AC | PRN
Start: 2020-12-02 — End: 2020-12-02
  Administered 2020-12-02: 10 mL

## 2020-12-02 MED ORDER — POLYETHYLENE GLYCOL 3350 17 G PO PACK
17.0000 g | PACK | Freq: Every day | ORAL | Status: DC
  Administered 2020-12-02 – 2020-12-10 (×5): 17 g via ORAL
  Filled 2020-12-02 (×7): qty 1

## 2020-12-02 NOTE — Consult Note (Signed)
Neurosurgery Consultation  Reason for Consult: Brain tumor Referring Physician: Delbert Phenix  CC: back pain  HPI: This is a 50 y.o. LEFT HANDED woman w/ a h/o breast Ca that presents with back pain, workup showed some bone metastases so she was admitted for further workup. Metastatic workup showed multiple bone lesions with liver and lung lesions as well as new brain mets. She denies any focal changes in vision, just some mild diffuse blurry vision that she states she usually gets before her prescription gets increased. No h/o seizures, no new weakness, numbness, or parasthesias, no recent change in bowel or bladder function. She is left handed, paints / writes w/ L hand. She does endorse some word finding difficulty over the past week or so, mainly dysnomia. 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   ROS: A 14 point ROS was performed and is negative except as noted in the HPI.   PMHx:  Past Medical History:  Diagnosis Date  . Arthritis    lower back  . Cancer [facility])    right breast cancer  . Family history of breast cancer   . Personal history of chemotherapy   . Personal history of radiation therapy    FamHx:  Family History  Problem Relation Age of Onset  . Lung cancer Maternal Grandfather   . Esophageal cancer Paternal Grandfather 61  . Breast cancer Cousin 31   SocHx:  reports that she has never smoked. She has never used smokeless tobacco. She reports current alcohol use. She reports that she does not use drugs.  Exam: Vital signs in last 24 hours: Temp:  [98.1 F (36.7 C)-98.6 F (37 C)] 98.1 F (36.7 C) (05/23 0606) Pulse Rate:  [64-72] 71 (05/23 0606) Resp:  [18] 18 (05/23 0606) BP: (113-143)/(83-94) 143/94 (05/23 0606) SpO2:  [97 %-99 %] 99 % (05/23 0606) General: Awake, alert, cooperative, lying in bed in NAD Head: Normocephalic and atruamatic HEENT: Neck supple Pulmonary:  breathing room air comfortably, no evidence of increased work of breathing Cardiac: RRR Abdomen: S NT ND Extremities: Warm and well perfused x4 Neuro: AOx3, PERRL, EOMI, FS Strength 5/5 x4, SILTx4, no drift Speech fluent with normal content   Assessment and Plan: 50 y.o. woman w/ h/o breast Ca, now with suspected recurrence with brain metastases. MRI brain personally reviewed, which shows 4 lesions - 1cm cerebellar, 3.8cm R posterior temporal lobe, 34mm L frontal, 1mm R parietal.   -discussed w/ the patient, I recommend SRS of the 4 lesions with resection of the large lesion given that it's symptomatic and >3cm. Given her speech complaints and clear left hand dominance, will need to be done awake with mapping. We discussed this at length, she is comfortable with the plan and wishes to proceed. No history of airway issues, no high risk airway features or history of severe anxiety, speech is excellent, good candidate for awake resection. Plan at this point is [facility] Thursday 5/26 and surgery Friday 5/27. -please call with any concerns or questions  Judith Part, MD 12/02/20 12:25 PM [facility]

## 2020-12-02 NOTE — Progress Notes (Signed)
Patient ID: Monica [name], female   DOB: [birth year], 50 y.o.   MRN: [id number] Insurance authorization is required for possible T9/T12 osteocool ablation on pt. At this time pt has no insurance to cover this procedure but her new insurance reportedly starts in June. We can reassess for coverage of procedure once insurance has been established.

## 2020-12-02 NOTE — Consult Note (Signed)
Radiation Oncology         [phone] ________________________________  Name: Monica [name]        MRN: [id number]  Date of Service: 12/02/20 DOB: Mar 09, 1971  RD:EYCXKGY, Myrtha Mantis    REFERRING PHYSICIAN: Dr. Jana Hakim   DIAGNOSIS: The primary encounter diagnosis was Metastatic malignant neoplasm, unspecified site [facility]). 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   HISTORY OF PRESENT ILLNESS: Monica [name] is a 50 y.o. female seen at the request of Dr. Irene Limbo and Dr. Jana Hakim with a history of Stage IB,pT2N0M0 grade 3, triple positive invasive ductal carcinoma of the right breast.  The patient was originally diagnosed in 2019, completed neoadjuvant chemotherapy followed by lumpectomy and adjuvant radiotherapy who was remained on antiestrogen therapy.  Unfortunately she started having back pain and was being evaluated by chiropractor since February 2022.  Her pain has progressed and she went to the emergency department on 11/30/2020, there is CT with renal protocol revealed nodular changes in the right lower lobe and septal thickening extending to the right middle lobe, large hepatic masses and a 3 mm kidney stone in the lower pole of the left kidney, cystic area in the left adnexa and lytic lesions throughout the thoracic and lumbar seen at nearly every level with a large sacral metastasis measuring 3.6 cm.  Given these findings she also underwent further imaging including an MRI of the brain which shows multiple metastatic lesions the largest in the right posterior temporal lobe with  cystic change and hemorrhage measuring up to 3.8 cm, a 10 mm left posterior cerebellum lesion was seen, and 18 mm enhancing partially cystic lesion in the right parietal lobe, and a 3 mm enhancing lesion in the left frontal cortex was seen CT of the chest abdomen pelvis with contrast showed widespread disease to the lungs bones and liver, her disease in the spine accompanied by compression fracture were seen at T9, T11, T12, and L2.  A total MRI of the spine showed pathologic fractures of T8-9, T12, possibly a pathologic endplate fracture at L2 and mild height loss at T11 without clear fracture.  No cord compression was identified however.  She was started on dexamethasone 4 mg twice daily, she has been working with palliative medicine as well and is using fentanyl, Dilaudid, Lidoderm, Zanaflex and Ultram.  Her pain has been better controlled today per the records.  She is contacted to discuss preoperative stereotactic radiosurgery, and palliative radiotherapy to her spine.  Today she has also gone for a liver biopsy.    PREVIOUS RADIATION THERAPY: Yes   08/15/2018 - 09/28/2018: The patient initially received a dose of 50.4 Gy in 28 fractions to the rightbreast using whole-breast tangent fields. This was delivered using a 3-D conformal technique. The patient then received a boost to the seroma. This delivered an additional 10 Gy in 5 fractions using12E, 9Eelectrons with a special teletherapy technique. The total dose was 60.4 Gy.   PAST MEDICAL HISTORY:  Past Medical History:  Diagnosis Date  . Arthritis    lower back  . Cancer [facility])    right breast cancer  . Family history of breast cancer   . Personal history of chemotherapy   .  Personal history of radiation therapy        PAST SURGICAL HISTORY: Past Surgical History:  Procedure Laterality Date  . ABDOMINAL HYSTERECTOMY    . BREAST LUMPECTOMY Right   . BREAST LUMPECTOMY WITH RADIOACTIVE SEED AND SENTINEL LYMPH NODE BIOPSY Right  02/16/2018   Procedure: RIGHT BREAST LUMPECTOMY WITH RADIOACTIVE SEED AND SENTINEL LYMPH NODE BIOPSY;  Surgeon: [name], MD;  Location: [place];  Service: General;  Laterality: Right;  . HYMENECTOMY    . PORTACATH PLACEMENT Right 02/16/2018   Procedure: INSERTION PORT-A-CATH;  Surgeon: [name], MD;  Location: [place];  Service: General;  Laterality: Right;  . RE-EXCISION OF BREAST LUMPECTOMY Right 02/22/2018   Procedure: RE-EXCISION OF RIGHT  BREAST LUMPECTOMY;  Surgeon: [name], MD;  Location: [place];  Service: General;  Laterality: Right;  . REPAIR VAGINAL CUFF N/A 02/07/2017   Procedure: REPAIR VAGINAL CUFF;  Surgeon: Ena Dawley, MD;  Location: [facility];  Service: Gynecology;  Laterality: N/A;  . ROBOTIC ASSISTED TOTAL HYSTERECTOMY WITH SALPINGECTOMY Left 01/20/2017   Procedure: ROBOTIC ASSISTED TOTAL HYSTERECTOMY WITH SALPINGECTOMY;  Surgeon: [name], MD;  Location: [facility];  Service: Gynecology;  Laterality: Left;     FAMILY HISTORY:  Family History  Problem Relation Age of Onset  . Lung cancer Maternal Grandfather   . Esophageal cancer Paternal Grandfather 43  . Breast cancer Cousin 31     SOCIAL HISTORY:  reports that she has never smoked. She has never used smokeless tobacco. She reports current alcohol use. She reports that she does not use drugs. The patient is separated and lives in [place]. She works as an Chartered loss adjuster.    ALLERGIES: Patient has no known allergies.   MEDICATIONS:  Current Facility-Administered Medications  Medication Dose Route Frequency Provider Last Rate Last Admin  . acetaminophen (TYLENOL) tablet 650 mg  650 mg Oral Q6H PRN Harold Hedge, MD       Or  . acetaminophen (TYLENOL) suppository 650 mg  650 mg Rectal Q6H PRN Harold Hedge, MD      . dexamethasone (DECADRON) tablet 4 mg  4 mg Oral BID WC Harold Hedge, MD   4 mg at 12/02/20 1158  . enoxaparin  (LOVENOX) injection 40 mg  40 mg Subcutaneous Q24H Magrinat, [name], MD   40 mg at 11/30/20 2100  . fentaNYL (SUBLIMAZE) 100 MCG/2ML injection           . fentaNYL (SUBLIMAZE) injection   Intravenous PRN Greggory Keen, MD   50 mcg at 12/02/20 1412  . gelatin adsorbable (GELFOAM/SURGIFOAM) 12-7 MM sponge 12-7 mm           . HYDROmorphone (DILAUDID) injection 1 mg  1 mg Intravenous Q2H PRN Arrien, Jimmy Picket, MD   1 mg at 12/02/20 0726  . lidocaine (LIDODERM) 5 % 1 patch  1 patch Transdermal [id number] Pershing Proud, NP   1 patch at 12/01/20 2130  . lidocaine-EPINEPHrine (XYLOCAINE W/EPI) 1 %-1:100000 (with pres) injection           . lidocaine-EPINEPHrine (XYLOCAINE W/EPI) 1 %-1:100000 (with pres) injection    PRN Greggory Keen, MD   10 mL at 12/02/20 1413  . midazolam (VERSED) 2 MG/2ML injection           . midazolam (VERSED) injection   Intravenous PRN Greggory Keen, MD   1 mg at 12/02/20 1412  . pantoprazole (PROTONIX) EC tablet 40 mg  40 mg  Oral Daily Arrien, Jimmy Picket, MD   40 mg at 12/02/20 0954  . polyethylene glycol (MIRALAX / GLYCOLAX) packet 17 g  17 g Oral Daily PRN Harold Hedge, MD      . senna [facility]) tablet 8.6 mg  1 tablet Oral Daily Vinie Sill C, NP   8.6 mg at 12/02/20 0954  . tiZANidine (ZANAFLEX) tablet 4 mg  4 mg Oral TID PRN Harold Hedge, MD   4 mg at 12/02/20 0607  . traMADol (ULTRAM) tablet 50 mg  50 mg Oral Q6H PRN Harold Hedge, MD   50 mg at 12/02/20 7425     REVIEW OF SYSTEMS: On review of systems, the patient reports that she is doing fairly well. Her pain is better controlled this afternoon and she reports her pain is at the small of her back and below. She does have pain that radiates into her groin and into the mid thigh. She denies pain in her genital or perineal area or down the back of her leg(s). No complaints of headaches, nausea, or vomiting are verbalized.     PHYSICAL EXAM:  Wt Readings from Last 3 Encounters:  11/30/20 183 lb 6.4  oz (83.2 kg)  06/20/20 181 lb 11.2 oz (82.4 kg)  06/20/19 170 lb 6.4 oz (77.3 kg)   Temp Readings from Last 3 Encounters:  12/02/20 98.1 F (36.7 C) (Oral)  06/20/20 97.8 F (36.6 C) (Tympanic)  06/20/19 (!) 97.2 F (36.2 C) (Temporal)   BP Readings from Last 3 Encounters:  12/02/20 (!) 143/94  06/20/20 120/82  06/20/19 128/73   Pulse Readings from Last 3 Encounters:  12/02/20 63  06/20/20 (!) 56  06/20/19 (!) 58   Pain Assessment Pain Score: 6 /10  Unable to assess due to encounter type.  ECOG = 3  0 - Asymptomatic (Fully active, able to carry on all predisease activities without restriction)  1 - Symptomatic but completely ambulatory (Restricted in physically strenuous activity but ambulatory and able to carry out work of a light or sedentary nature. For example, light housework, office work)  2 - Symptomatic, <50% in bed during the day (Ambulatory and capable of all self care but unable to carry out any work activities. Up and about more than 50% of waking hours)  3 - Symptomatic, >50% in bed, but not bedbound (Capable of only limited self-care, confined to bed or chair 50% or more of waking hours)  4 - Bedbound (Completely disabled. Cannot carry on any self-care. Totally confined to bed or chair)  5 - Death   Eustace Pen MM, Creech RH, Tormey DC, et al. [phone]). "Toxicity and response criteria of the [facility] Group". [place] Oncol. 5 (6): 649-55    LABORATORY DATA:  Lab Results  Component Value Date   WBC 12.2 (H) 12/02/2020   HGB 12.1 12/02/2020   HCT 37.1 12/02/2020   MCV 92.3 12/02/2020   PLT 274 12/02/2020   Lab Results  Component Value Date   NA 141 12/01/2020   K 4.3 12/01/2020   CL 107 12/01/2020   CO2 29 12/01/2020   Lab Results  Component Value Date   ALT 34 11/30/2020   AST 39 11/30/2020   ALKPHOS 85 11/30/2020   BILITOT 0.4 11/30/2020      RADIOGRAPHY: DG Chest 2 View  Result Date: 11/15/2020 CLINICAL DATA:  Cough  history of breast cancer EXAM: CHEST - 2 VIEW COMPARISON:  02/16/2018 FINDINGS: The heart size and mediastinal  contours are within normal limits. Both lungs are clear. The visualized skeletal structures are unremarkable. Postsurgical changes of the right chest. IMPRESSION: No active cardiopulmonary disease. Electronically Signed   By: Donavan Foil M.D.   On: 11/15/2020 16:26   MR BRAIN W WO CONTRAST  Result Date: 11/30/2020 CLINICAL DATA:  Metastatic breast cancer, staging EXAM: MRI HEAD WITHOUT AND WITH CONTRAST TECHNIQUE: Multiplanar, multiecho pulse sequences of the brain and surrounding structures were obtained without and with intravenous contrast. CONTRAST:  19mL GADAVIST GADOBUTROL 1 MMOL/ML IV SOLN COMPARISON:  None. FINDINGS: Brain: Multiple enhancing lesions the brain compatible with metastatic disease. Solid enhancing lesion left posterior cerebellum 10 mm axial image 48. Mixed cystic and solid lesion in the right posterior temporal lobe measures 38 x 19 mm on axial images. Mild surrounding edema. Mild associated hemorrhage. 18 mm enhancing and partially cystic mass in the right parietal lobe with mild hemorrhage 3 mm enhancing lesion left frontal cortex axial image 128 Ventricle size normal. No midline shift. No acute or chronic ischemic changes. Vascular: Normal arterial flow voids Skull and upper cervical spine: Enhancing lesions in the frontal bone bilaterally compatible with metastatic disease. Enhancing in the left lateral parietal bone. Sinuses/Orbits: Paranasal sinuses clear.  Negative orbit Other: None IMPRESSION: Multiple metastatic lesions in the brain. The largest lesion is in the right posterior temporal lobe and shows cystic change and hemorrhage. Additional lesions as above. No midline shift. 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 Electronically Signed   By: Franchot Gallo M.D.   On: 11/30/2020 18:57   CT CHEST ABDOMEN PELVIS W CONTRAST  Result Date: 11/30/2020 CLINICAL DATA:   Inpatient. Widespread metastatic disease to the brain, bones, liver and lungs. History of breast cancer. Right abdominal pain. EXAM: CT CHEST, ABDOMEN, AND PELVIS WITH CONTRAST TECHNIQUE: Multidetector CT imaging of the chest, abdomen and pelvis was performed following the standard protocol during bolus administration of intravenous contrast. CONTRAST:  54mL OMNIPAQUE IOHEXOL 300 MG/ML  SOLN COMPARISON:  11/30/2020 unenhanced CT abdomen/pelvis. FINDINGS: CT CHEST FINDINGS Cardiovascular: Normal heart size. No significant pericardial effusion/thickening. Great vessels are normal in course and caliber. No central pulmonary emboli. Mediastinum/Nodes: Subcentimeter hypodense left thyroid nodule. Not clinically significant; no follow-up imaging recommended (ref: J Am Coll Radiol. 2015 Feb;12(2): 143-50). Unremarkable esophagus. Surgical clip in the right axilla. No pathologically enlarged axillary nodes. No mediastinal or hilar adenopathy. Lungs/Pleura: No pneumothorax. No pleural effusion. Several (greater than 15) irregular solid pulmonary nodules scattered throughout the right greater than left lungs, largest 2.2 cm in the central right middle lobe (series 4/image 79), 1.5 cm in the anterior right lower lobe (series 4/image 105) and 0.7 cm in the superior segment right lower lobe (series 4/image 76). Minimal patchy subpleural reticulation in the anterior mid to upper right lung, compatible with minimal radiation fibrosis. Mild platelike atelectasis in the right middle lobe peripheral to the central lesion. Musculoskeletal: Widespread lytic osseous lesions in the thoracic spine with associated mild pathologic fractures of the T9, T11 and superior T12 vertebral bodies. Faint lytic lesion in the left manubrium. Mild thoracic spondylosis. CT ABDOMEN PELVIS FINDINGS Hepatobiliary: Numerous (greater than 10) hypoenhancing liver masses scattered throughout the liver, largest 6.6 x 5.6 cm at the right liver dome (series  2/image 49), 5.0 x 4.0 cm in the central right liver adjacent to the IVC (series 2/image 59) and 4.2 x 2.8 cm in the segment 4 left liver (series 2/image 63). Normal gallbladder with no radiopaque cholelithiasis. No biliary ductal dilatation. Pancreas: Normal,  with no mass or duct dilation. Spleen: Normal size. No mass. Adrenals/Urinary Tract: Normal adrenals. Subcentimeter hypodense anterior interpolar right renal cortical lesion is too small to characterize. Otherwise normal kidneys, with no hydronephrosis. Normal collapsed bladder. Stomach/Bowel: Normal non-distended stomach. Normal caliber small bowel with no small bowel wall thickening. Normal appendix. Oral contrast transits to the colon. Minimal sigmoid diverticulosis with no large bowel wall thickening or significant pericolonic fat stranding. Vascular/Lymphatic: Normal caliber abdominal aorta. Patent portal, splenic, hepatic and renal veins. No pathologically enlarged lymph nodes in the abdomen or pelvis. Reproductive: Status post hysterectomy, with no abnormal findings at the vaginal cuff. No right adnexal mass. Left adnexal 3.8 cm simple cyst (series 2/image 111). No follow-up imaging recommended. Note: This recommendation does not apply to premenarchal patients and to those with increased risk (genetic, family history, elevated tumor markers or other high-risk factors) of ovarian cancer. Reference: JACR 2020 Feb; 17(2):248-254 Other: No pneumoperitoneum, ascites or focal fluid collection. Musculoskeletal: Widespread scattered lytic osseous lesions to lumbar vertebral bodies, most prominent at L3 with mild pathologic L2 vertebral compression fracture. Scattered lytic osseous lesions in the upper sacrum and left greater than right medial iliac bones bilaterally. Moderate degenerative disc disease in the lower lumbar spine. IMPRESSION: 1. Widespread metastatic disease to the lungs, bones and liver as detailed, probably due to recurrent breast cancer. No  definite separate potential primary neoplasm identified. 2. Mild pathologic T9, T11, T12 and L2 vertebral compression fractures. 3. Minimal sigmoid diverticulosis. Electronically Signed   By: Ilona Sorrel M.D.   On: 11/30/2020 19:23   CT Renal Stone Study  Addendum Date: 11/30/2020   ADDENDUM REPORT: 11/30/2020 13:49 ADDENDUM: These results were called by telephone at the time of interpretation on 11/30/2020 at 1:48 pm to provider Sherol Dade , who verbally acknowledged these results. Electronically Signed   By: Zetta Bills M.D.   On: 11/30/2020 13:49   Result Date: 11/30/2020 CLINICAL DATA:  RIGHT flank pain, kidney stone suspected. EXAM: CT ABDOMEN AND PELVIS WITHOUT CONTRAST TECHNIQUE: Multidetector CT imaging of the abdomen and pelvis was performed following the standard protocol without IV contrast. COMPARISON:  Only chest x-rays are available for comparison. FINDINGS: Lower chest: Spiculated nodular area in the RIGHT lower lobe (image 9/4) 1.6 x 1.5 cm. Septal thickening extending to the periphery of the RIGHT chest in the RIGHT middle lobe. Additional spiculated nodule 12 mm (image 44/4) RIGHT lower lobe. Smaller nodule at the periphery measuring 3-4 mm. No effusion. No consolidation. Hepatobiliary: Large hepatic masses. Largest in the RIGHT lobe (image 25/2 5.8 x 5.6 cm. Central hepatic mass (image 32/2 4.5 x 3.5 cm. LEFT lobe lesion (image 42/22.4 cm. Additional lesions scattered throughout the liver, at least 10 total lesions. No pericholecystic stranding. Pancreas: Pancreas with normal contour.  No sign of inflammation. Spleen: Spleen normal in size and contour. Adrenals/Urinary Tract: Adrenal glands are normal. Nephrolithiasis in the lower pole of the LEFT kidney 3 mm (image 50/2) smooth renal contours. No hydronephrosis. No perinephric stranding. Urinary bladder under distended. Stomach/Bowel: Stomach is unremarkable. Small bowel normal caliber. Appendix is normal. No acute colonic  process. Vascular/Lymphatic: Normal caliber of the abdominal aorta and IVC. There is no gastrohepatic or hepatoduodenal ligament lymphadenopathy. No retroperitoneal or mesenteric lymphadenopathy. No pelvic sidewall lymphadenopathy. Reproductive: Post hysterectomy. Cystic area in the LEFT adnexa measuring 4.2 x 3.2 cm in axial dimension and 4.0 cm in greatest craniocaudal extent. Other: No ascites.  No free air. Musculoskeletal: Diffuse bony metastatic disease with lytic features.  For instance, in the LEFT posterior iliac with destructive features on image 65 of series 2 is a 3.1 cm lytic focus.a this is associated with pathologic fracture of the posterior LEFT iliac. Lytic lesions are seen to varying degrees throughout the thoracic and the lumbar spine nearly every level of the spine is involved. Also with a large sacral metastasis on image 63 of series 2 measuring 3.6 x 3.6 cm. Mild loss of height due to pathologic fracture at the T9 vertebral level approximately 10-15% loss of height. Lytic metastases at the remaining levels of the thoracic spine with mild loss of height at T12 as well likely due to pathologic endplate fracture. At the T10 level there is suggestion of soft tissue extending posteriorly into the central canal, difficult to determine on noncontrast evaluation but with posterior cortical destruction and potential extension is much as 7 mm into the central canal posterior to the T10 level. Multilevel metastatic disease in the lumbar spine as well largest lesion approximately 1.5 cm in the L3 vertebral level. Pathologic fracture of the superior endplate on the RIGHT at L4 with endplate concavity, mild endplate concavity at this level. IMPRESSION: 1. Diffuse bony metastatic disease with lytic features and associated pathologic fractures in the thoracic, lumbar spine and in the pelvis. 2. Potential soft tissue extension into the central canal at the T10 level. Correlate with symptoms and suggest MRI for  further assessment as warranted. 3. Spiculated nodules in the chest with morphologic features that would be more suggestive of bronchogenic neoplasm but given multiplicity could also be seen with metastatic disease. 4. 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 5. Large hepatic masses, at least 10 total lesions. Findings are concerning for metastatic disease. 6. Cystic area in the LEFT adnexa measuring 4.2 x 3.2 cm in axial dimension and 4.0 cm in greatest craniocaudal extent. This is indeterminate, standard follow-up recommendations may not apply in this instance. Normally no follow-up would be recommended, however in the current context could consider follow-up sonogram for further evaluation on a nonemergent basis. 7. Pathologic fracture of the superior endplate on the RIGHT at L4 with endplate concavity, mild endplate concavity at this level. 8. Nonobstructive LEFT nephrolithiasis. 9. Aortic atherosclerosis. Electronically Signed: By: Zetta Bills M.D. On: 11/30/2020 13:16   MR TOTAL SPINE METS SCREENING  Addendum Date: 12/01/2020   ADDENDUM REPORT: 12/01/2020 19:31 ADDENDUM: Further review of this study with comparison to the CT of the chest, abdomen and pelvis shows pathologic fractures at T9 and T12. At T9, there is approximately 30 percent central height loss with violation of the superior endplate. No retropulsion. At T12, there is depression of the superior endplate measuring 6 mm. There is no retropulsion. Any edema is difficult to separate from the abnormal metastatic bone marrow. At L2, there is a defect in the inferior endplate that is equivocal but could indicate a minimally displaced pathologic endplate fracture. There is mild height loss at T11 without a clear fracture. Electronically Signed   By: Ulyses Jarred M.D.   On: 12/01/2020 19:31   Result Date: 12/01/2020 CLINICAL DATA:  Staging of unknown primary cancer EXAM: MRI TOTAL SPINE WITHOUT AND WITH CONTRAST TECHNIQUE: Multisequence MR  imaging of the spine from the cervical spine to the sacrum was performed prior to and following IV contrast administration for evaluation of spinal metastatic disease. COMPARISON:  None. FINDINGS: There are numerous metastatic lesions throughout the spine. There are lesions at most levels, but the largest lesions are at T9, T10, T11,  T12, L2, L3 and S1. There is no compression fracture. There is no abnormal epidural contrast enhancement. Spinal cord parenchyma is normal. Localizer images show multiple lesions within the liver. There is a nodular mass at the right lung base. IMPRESSION: 1. Numerous metastatic lesions of the cervical, thoracic and lumbar spine. The greatest burden of lesions are at the T9 level and below, but there are lesions as high as C3. 2. No pathologic fracture. 3. No epidural mass. 4. Likely metastatic lesions within the liver and at the right lung base, better evaluated on earlier CT. Electronically Signed: By: Ulyses Jarred M.D. On: 11/30/2020 19:11       IMPRESSION/PLAN: 1. Recurrent Metastatic Stage IB,pT2N0M0 grade 3, triple positive invasive ductal carcinoma of the right breast with bone, liver, and brain disease. Dr. [name] has reviewed her imaging and discussed her case with her inpatient and oncology team.  Unfortunately it appears that the patient has widespread disease, and Dr. [name] would recommend proceeding with palliative radiotherapy to her spine and stereotactic radiosurgery [facility]).  It appears that she may be a good candidate for preoperative SRS as a single fraction followed by craniotomy to resect the largest lesion in the temporal lobe. We discussed the risks, benefits, short, and long term effects of radiotherapy, as well as the curative intent to the brain, and palliative intent to the spine, and the patient is interested in proceeding. Dr. [name] discusses the delivery and logistics of radiotherapy and anticipates a course of a single fraction prior to surgery for SRS  to the brain, and a course of 2-3 weeks of radiotherapy to the spine.  She will simulate tomorrow morning, and begin therapy to the spine on Wednesday.  We discussed the logistics of needing a 3T MRI to plan treatment to the brain, Dr. Zada Finders is aware of her situation as well and has already outlined the plan as well for preoperative SRS.  She will meet back with Dr. Jana Hakim to discuss additional systemic treatment after immunohistochemistry is performed on her biopsy to the liver.      Carola Rhine, [facility]   **Disclaimer: This note was dictated with voice recognition software. Similar sounding words can inadvertently be transcribed and this note may contain transcription errors which may not have been corrected upon publication of note.**

## 2020-12-02 NOTE — Progress Notes (Signed)
Monica [name]   DOB:1970-08-19   UJ#:[id number]   NFA#:[id number]  Subjective:  Monica [name] has been experiencing back pain since February 2022.  She sought chiropractic help for this and it seemed to work but more recently the pain became poorly controlled and she saw primary care who set her up for a kidney stone scan.  This did not show kidney stones but did show multiple bone lesions.  She was admitted 11/29/2020 and work-up so far has shown multiple bone lesions, liver lesions, lung lesions, and involvement of the central nervous system.  Liver biopsy is pending today.  Note that she continue to work through 11/29/2020.  This morning she is alert, comfortable and as always very forward looking and making plans.  She says her pain is currently well controlled but she is getting some itching.  She apparently pulled a muscle in the right groin doing some stretching exercises last week and that is interfering with her walking but she says she is able to get around using her walker to some extent.  She is concerned because she lives in a second floor apartment with no elevator.    Her daughter lives in [place] but her son is still stationed in [place] and may need some papers so he can come to visit her at this time.  She also had a Zoom meeting with her family last night and "everyone is on board".   Objective: Monica [name] examined in bed Vitals:   12/01/20 2147 12/02/20 0606  BP: 113/83 (!) 143/94  Pulse: 72 71  Resp: 18 18  Temp: 98.6 F (37 C) 98.1 F (36.7 C)  SpO2: 98% 99%    Body mass index is 29.6 kg/m.  Intake/Output Summary (Last 24 hours) at 12/02/2020 0853 Last data filed at 12/02/2020 0646 Gross per 24 hour  Intake 270 ml  Output --  Net 270 ml     Sclerae unicteric  Lungs no rales or wheezes--auscultated anterolaterally  Heart regular rate and rhythm  Abdomen soft, +BS  Neuro no motor or sensory deficit noted  Breast exam: Deferred  CBG (last 3)  No results for  input(s): GLUCAP in the last 72 hours.   Labs:  Lab Results  Component Value Date   WBC 12.2 (H) 12/02/2020   HGB 12.1 12/02/2020   HCT 37.1 12/02/2020   MCV 92.3 12/02/2020   PLT 274 12/02/2020   NEUTROABS 5.9 11/30/2020    '@LASTCHEMISTRY' @  Urine Studies No results for input(s): UHGB, CRYS in the last 72 hours.  Invalid input(s): UACOL, UAPR, USPG, UPH, UTP, UGL, UKET, UBIL, UNIT, UROB, ULEU, UEPI, UWBC, URBC, UBAC, CAST, UCOM, BILUA  Basic Metabolic Panel: Recent Labs  Lab 11/30/20 1153 12/01/20 0700  NA 137 141  K 3.9 4.3  CL 105 107  CO2 25 29  GLUCOSE 94 93  BUN 16 11  CREATININE 0.80 0.74  CALCIUM 9.6 9.6   GFR Estimated Creatinine Clearance: 92.5 mL/min (by C-G formula based on SCr of 0.74 mg/dL). Liver Function Tests: Recent Labs  Lab 11/30/20 1153  AST 39  ALT 34  ALKPHOS 85  BILITOT 0.4  PROT 6.9  ALBUMIN 3.5   No results for input(s): LIPASE, AMYLASE in the last 168 hours. No results for input(s): AMMONIA in the last 168 hours. Coagulation profile Recent Labs  Lab 12/02/20 0612  INR 1.0    CBC: Recent Labs  Lab 11/30/20 1153 12/01/20 0700 12/02/20 0612  WBC 8.7 7.9 12.2*  NEUTROABS 5.9  --   --  HGB 11.9* 11.8* 12.1  HCT 36.6 36.9 37.1  MCV 93.1 94.4 92.3  PLT 253 259 274   Cardiac Enzymes: No results for input(s): CKTOTAL, CKMB, CKMBINDEX, TROPONINI in the last 168 hours. BNP: Invalid input(s): POCBNP CBG: No results for input(s): GLUCAP in the last 168 hours. D-Dimer No results for input(s): DDIMER in the last 72 hours. Hgb A1c No results for input(s): HGBA1C in the last 72 hours. Lipid Profile No results for input(s): CHOL, HDL, LDLCALC, TRIG, CHOLHDL, LDLDIRECT in the last 72 hours. Thyroid function studies No results for input(s): TSH, T4TOTAL, T3FREE, THYROIDAB in the last 72 hours.  Invalid input(s): FREET3 Anemia work up No results for input(s): VITAMINB12, FOLATE, FERRITIN, TIBC, IRON, RETICCTPCT in the last  72 hours. Microbiology Recent Results (from the past 240 hour(s))  Resp Panel by RT-PCR (Flu A&B, Covid) Nasopharyngeal Swab     Status: None   Collection Time: 11/30/20  3:13 PM   Specimen: Nasopharyngeal Swab; Nasopharyngeal(NP) swabs in vial transport medium  Result Value Ref Range Status   SARS Coronavirus 2 by RT PCR NEGATIVE NEGATIVE Final    Comment: (NOTE) SARS-CoV-2 target nucleic acids are NOT DETECTED.  The SARS-CoV-2 RNA is generally detectable in upper respiratory specimens during the acute phase of infection. The lowest concentration of SARS-CoV-2 viral copies this assay can detect is 138 copies/mL. A negative result does not preclude SARS-Cov-2 infection and should not be used as the sole basis for treatment or other patient management decisions. A negative result may occur with  improper specimen collection/handling, submission of specimen other than nasopharyngeal swab, presence of viral mutation(s) within the areas targeted by this assay, and inadequate number of viral copies(<138 copies/mL). A negative result must be combined with clinical observations, patient history, and epidemiological information. The expected result is Negative.  Fact Sheet for Patients:  EntrepreneurPulse.com.au  Fact Sheet for Healthcare Providers:  IncredibleEmployment.be  This test is no t yet approved or cleared by the [place] FDA and  has been authorized for detection and/or diagnosis of SARS-CoV-2 by FDA under an Emergency Use Authorization (EUA). This EUA will remain  in effect (meaning this test can be used) for the duration of the COVID-19 declaration under Section 564(b)(1) of the Act, 21 U.S.C.section 360bbb-3(b)(1), unless the authorization is terminated  or revoked sooner.       Influenza A by PCR NEGATIVE NEGATIVE Final   Influenza B by PCR NEGATIVE NEGATIVE Final    Comment: (NOTE) The Xpert Xpress SARS-CoV-2/FLU/RSV plus  assay is intended as an aid in the diagnosis of influenza from Nasopharyngeal swab specimens and should not be used as a sole basis for treatment. Nasal washings and aspirates are unacceptable for Xpert Xpress SARS-CoV-2/FLU/RSV testing.  Fact Sheet for Patients: EntrepreneurPulse.com.au  Fact Sheet for Healthcare Providers: IncredibleEmployment.be  This test is not yet approved or cleared by the [place] FDA and has been authorized for detection and/or diagnosis of SARS-CoV-2 by FDA under an Emergency Use Authorization (EUA). This EUA will remain in effect (meaning this test can be used) for the duration of the COVID-19 declaration under Section 564(b)(1) of the Act, 21 U.S.C. section 360bbb-3(b)(1), unless the authorization is terminated or revoked.  Performed at [facility], [place] [address] [place], [place] [address]       Studies:  MR BRAIN W WO CONTRAST  Result Date: 11/30/2020 CLINICAL DATA:  Metastatic breast cancer, staging EXAM: MRI HEAD WITHOUT AND WITH CONTRAST TECHNIQUE: Multiplanar, multiecho pulse sequences of  the brain and surrounding structures were obtained without and with intravenous contrast. CONTRAST:  61m GADAVIST GADOBUTROL 1 MMOL/ML IV SOLN COMPARISON:  None. FINDINGS: Brain: Multiple enhancing lesions the brain compatible with metastatic disease. Solid enhancing lesion left posterior cerebellum 10 mm axial image 48. Mixed cystic and solid lesion in the right posterior temporal lobe measures 38 x 19 mm on axial images. Mild surrounding edema. Mild associated hemorrhage. 18 mm enhancing and partially cystic mass in the right parietal lobe with mild hemorrhage 3 mm enhancing lesion left frontal cortex axial image 128 Ventricle size normal. No midline shift. No acute or chronic ischemic changes. Vascular: Normal arterial flow voids Skull and upper cervical spine: Enhancing lesions in the frontal bone  bilaterally compatible with metastatic disease. Enhancing in the left lateral parietal bone. Sinuses/Orbits: Paranasal sinuses clear.  Negative orbit Other: None IMPRESSION: Multiple metastatic lesions in the brain. The largest lesion is in the right posterior temporal lobe and shows cystic change and hemorrhage. Additional lesions as above. No midline shift. 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 Electronically Signed   By: CFranchot GalloM.D.   On: 11/30/2020 18:57   CT CHEST ABDOMEN PELVIS W CONTRAST  Result Date: 11/30/2020 CLINICAL DATA:  Inpatient. Widespread metastatic disease to the brain, bones, liver and lungs. History of breast cancer. Right abdominal pain. EXAM: CT CHEST, ABDOMEN, AND PELVIS WITH CONTRAST TECHNIQUE: Multidetector CT imaging of the chest, abdomen and pelvis was performed following the standard protocol during bolus administration of intravenous contrast. CONTRAST:  78mOMNIPAQUE IOHEXOL 300 MG/ML  SOLN COMPARISON:  11/30/2020 unenhanced CT abdomen/pelvis. FINDINGS: CT CHEST FINDINGS Cardiovascular: Normal heart size. No significant pericardial effusion/thickening. Great vessels are normal in course and caliber. No central pulmonary emboli. Mediastinum/Nodes: Subcentimeter hypodense left thyroid nodule. Not clinically significant; no follow-up imaging recommended (ref: J Am Coll Radiol. 2015 Feb;12(2): 143-50). Unremarkable esophagus. Surgical clip in the right axilla. No pathologically enlarged axillary nodes. No mediastinal or hilar adenopathy. Lungs/Pleura: No pneumothorax. No pleural effusion. Several (greater than 15) irregular solid pulmonary nodules scattered throughout the right greater than left lungs, largest 2.2 cm in the central right middle lobe (series 4/image 79), 1.5 cm in the anterior right lower lobe (series 4/image 105) and 0.7 cm in the superior segment right lower lobe (series 4/image 76). Minimal patchy subpleural reticulation in the anterior mid to upper right  lung, compatible with minimal radiation fibrosis. Mild platelike atelectasis in the right middle lobe peripheral to the central lesion. Musculoskeletal: Widespread lytic osseous lesions in the thoracic spine with associated mild pathologic fractures of the T9, T11 and superior T12 vertebral bodies. Faint lytic lesion in the left manubrium. Mild thoracic spondylosis. CT ABDOMEN PELVIS FINDINGS Hepatobiliary: Numerous (greater than 10) hypoenhancing liver masses scattered throughout the liver, largest 6.6 x 5.6 cm at the right liver dome (series 2/image 49), 5.0 x 4.0 cm in the central right liver adjacent to the IVC (series 2/image 59) and 4.2 x 2.8 cm in the segment 4 left liver (series 2/image 63). Normal gallbladder with no radiopaque cholelithiasis. No biliary ductal dilatation. Pancreas: Normal, with no mass or duct dilation. Spleen: Normal size. No mass. Adrenals/Urinary Tract: Normal adrenals. Subcentimeter hypodense anterior interpolar right renal cortical lesion is too small to characterize. Otherwise normal kidneys, with no hydronephrosis. Normal collapsed bladder. Stomach/Bowel: Normal non-distended stomach. Normal caliber small bowel with no small bowel wall thickening. Normal appendix. Oral contrast transits to the colon. Minimal sigmoid diverticulosis with no large bowel wall thickening or significant pericolonic  fat stranding. Vascular/Lymphatic: Normal caliber abdominal aorta. Patent portal, splenic, hepatic and renal veins. No pathologically enlarged lymph nodes in the abdomen or pelvis. Reproductive: Status post hysterectomy, with no abnormal findings at the vaginal cuff. No right adnexal mass. Left adnexal 3.8 cm simple cyst (series 2/image 111). No follow-up imaging recommended. Note: This recommendation does not apply to premenarchal patients and to those with increased risk (genetic, family history, elevated tumor markers or other high-risk factors) of ovarian cancer. Reference: JACR 2020 Feb;  17(2):248-254 Other: No pneumoperitoneum, ascites or focal fluid collection. Musculoskeletal: Widespread scattered lytic osseous lesions to lumbar vertebral bodies, most prominent at L3 with mild pathologic L2 vertebral compression fracture. Scattered lytic osseous lesions in the upper sacrum and left greater than right medial iliac bones bilaterally. Moderate degenerative disc disease in the lower lumbar spine. IMPRESSION: 1. Widespread metastatic disease to the lungs, bones and liver as detailed, probably due to recurrent breast cancer. No definite separate potential primary neoplasm identified. 2. Mild pathologic T9, T11, T12 and L2 vertebral compression fractures. 3. Minimal sigmoid diverticulosis. Electronically Signed   By: Ilona Sorrel M.D.   On: 11/30/2020 19:23   CT Renal Stone Study  Addendum Date: 11/30/2020   ADDENDUM REPORT: 11/30/2020 13:49 ADDENDUM: These results were called by telephone at the time of interpretation on 11/30/2020 at 1:48 pm to provider Sherol Dade , who verbally acknowledged these results. Electronically Signed   By: Zetta Bills M.D.   On: 11/30/2020 13:49   Result Date: 11/30/2020 CLINICAL DATA:  RIGHT flank pain, kidney stone suspected. EXAM: CT ABDOMEN AND PELVIS WITHOUT CONTRAST TECHNIQUE: Multidetector CT imaging of the abdomen and pelvis was performed following the standard protocol without IV contrast. COMPARISON:  Only chest x-rays are available for comparison. FINDINGS: Lower chest: Spiculated nodular area in the RIGHT lower lobe (image 9/4) 1.6 x 1.5 cm. Septal thickening extending to the periphery of the RIGHT chest in the RIGHT middle lobe. Additional spiculated nodule 12 mm (image 44/4) RIGHT lower lobe. Smaller nodule at the periphery measuring 3-4 mm. No effusion. No consolidation. Hepatobiliary: Large hepatic masses. Largest in the RIGHT lobe (image 25/2 5.8 x 5.6 cm. Central hepatic mass (image 32/2 4.5 x 3.5 cm. LEFT lobe lesion (image 42/22.4 cm.  Additional lesions scattered throughout the liver, at least 10 total lesions. No pericholecystic stranding. Pancreas: Pancreas with normal contour.  No sign of inflammation. Spleen: Spleen normal in size and contour. Adrenals/Urinary Tract: Adrenal glands are normal. Nephrolithiasis in the lower pole of the LEFT kidney 3 mm (image 50/2) smooth renal contours. No hydronephrosis. No perinephric stranding. Urinary bladder under distended. Stomach/Bowel: Stomach is unremarkable. Small bowel normal caliber. Appendix is normal. No acute colonic process. Vascular/Lymphatic: Normal caliber of the abdominal aorta and IVC. There is no gastrohepatic or hepatoduodenal ligament lymphadenopathy. No retroperitoneal or mesenteric lymphadenopathy. No pelvic sidewall lymphadenopathy. Reproductive: Post hysterectomy. Cystic area in the LEFT adnexa measuring 4.2 x 3.2 cm in axial dimension and 4.0 cm in greatest craniocaudal extent. Other: No ascites.  No free air. Musculoskeletal: Diffuse bony metastatic disease with lytic features. For instance, in the LEFT posterior iliac with destructive features on image 65 of series 2 is a 3.1 cm lytic focus.a this is associated with pathologic fracture of the posterior LEFT iliac. Lytic lesions are seen to varying degrees throughout the thoracic and the lumbar spine nearly every level of the spine is involved. Also with a large sacral metastasis on image 63 of series 2 measuring 3.6 x 3.6  cm. Mild loss of height due to pathologic fracture at the T9 vertebral level approximately 10-15% loss of height. Lytic metastases at the remaining levels of the thoracic spine with mild loss of height at T12 as well likely due to pathologic endplate fracture. At the T10 level there is suggestion of soft tissue extending posteriorly into the central canal, difficult to determine on noncontrast evaluation but with posterior cortical destruction and potential extension is much as 7 mm into the central canal  posterior to the T10 level. Multilevel metastatic disease in the lumbar spine as well largest lesion approximately 1.5 cm in the L3 vertebral level. Pathologic fracture of the superior endplate on the RIGHT at L4 with endplate concavity, mild endplate concavity at this level. IMPRESSION: 1. Diffuse bony metastatic disease with lytic features and associated pathologic fractures in the thoracic, lumbar spine and in the pelvis. 2. Potential soft tissue extension into the central canal at the T10 level. Correlate with symptoms and suggest MRI for further assessment as warranted. 3. Spiculated nodules in the chest with morphologic features that would be more suggestive of bronchogenic neoplasm but given multiplicity could also be seen with metastatic disease. 4. 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 5. Large hepatic masses, at least 10 total lesions. Findings are concerning for metastatic disease. 6. Cystic area in the LEFT adnexa measuring 4.2 x 3.2 cm in axial dimension and 4.0 cm in greatest craniocaudal extent. This is indeterminate, standard follow-up recommendations may not apply in this instance. Normally no follow-up would be recommended, however in the current context could consider follow-up sonogram for further evaluation on a nonemergent basis. 7. Pathologic fracture of the superior endplate on the RIGHT at L4 with endplate concavity, mild endplate concavity at this level. 8. Nonobstructive LEFT nephrolithiasis. 9. Aortic atherosclerosis. Electronically Signed: By: Zetta Bills M.D. On: 11/30/2020 13:16   MR TOTAL SPINE METS SCREENING  Addendum Date: 12/01/2020   ADDENDUM REPORT: 12/01/2020 19:31 ADDENDUM: Further review of this study with comparison to the CT of the chest, abdomen and pelvis shows pathologic fractures at T9 and T12. At T9, there is approximately 30 percent central height loss with violation of the superior endplate. No retropulsion. At T12, there is depression of the superior  endplate measuring 6 mm. There is no retropulsion. Any edema is difficult to separate from the abnormal metastatic bone marrow. At L2, there is a defect in the inferior endplate that is equivocal but could indicate a minimally displaced pathologic endplate fracture. There is mild height loss at T11 without a clear fracture. Electronically Signed   By: Ulyses Jarred M.D.   On: 12/01/2020 19:31   Result Date: 12/01/2020 CLINICAL DATA:  Staging of unknown primary cancer EXAM: MRI TOTAL SPINE WITHOUT AND WITH CONTRAST TECHNIQUE: Multisequence MR imaging of the spine from the cervical spine to the sacrum was performed prior to and following IV contrast administration for evaluation of spinal metastatic disease. COMPARISON:  None. FINDINGS: There are numerous metastatic lesions throughout the spine. There are lesions at most levels, but the largest lesions are at T9, T10, T11, T12, L2, L3 and S1. There is no compression fracture. There is no abnormal epidural contrast enhancement. Spinal cord parenchyma is normal. Localizer images show multiple lesions within the liver. There is a nodular mass at the right lung base. IMPRESSION: 1. Numerous metastatic lesions of the cervical, thoracic and lumbar spine. The greatest burden of lesions are at the T9 level and below, but there are lesions as high as  C3. 2. No pathologic fracture. 3. No epidural mass. 4. Likely metastatic lesions within the liver and at the right lung base, better evaluated on earlier CT. Electronically Signed: By: Ulyses Jarred M.D. On: 11/30/2020 19:11    Assessment: 50 y.o. Monica [name] admitted 11/29/2020 with evidence of widespread metastatic disease  (1) genetics testing 02/02/2018 though the CancerNext gene panel offered by [facility] showed no deleterious mutations in  APC, ATM, BARD1, BMPR1A,BRCA1, BRCA2, BRIP1, CDH1, CDK4, CDKN2A, CHEK2, DICER1, HOXB13, MLH1, MRE11A, MSH2, MSH6, MUTYH, NBN, NF1, PALB2, PMS2, POLD1, POLE, PTEN, RAD50,  RAD51C, RAD51D, SMAD4, SMARCA4, STK11 and TP53 (sequencing and deletion/duplication); EPCAM and GREM1 (deletion/duplication only).             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               (a) Pertuzumab omitted after cycle 1 due to diarrhea.             (b) Gemcitabine substituted for docetaxel starting with cycle 5 due to peripheral neuropathy  (4) continued trastuzumab to total 6 months (through February 2020).             (a) echocardiogram 01/21/2018 showed an ejection fraction in the 55-60% range             (b) repeat echocardiogram 06/14/2018 showed an ejection fraction in the 60-65%  (5) adjuvant radiation 08/15/2018 - 09/28/2018 Site/dose:The patient initially received a dose of 50.4 Gy in 28 fractions to the rightbreast using whole-breast tangent fields. This was delivered using a 3-D conformal technique. The patient then received a boost to the seroma. This delivered an additional 10 Gy in 5 fractions using12E, 9Eelectrons with a special teletherapy technique. The total dose was 60.4 Gy.   (6) started tamoxifen March 2020             (a) the patient is status post hysterectomy, wihtout bilateral salpingo-oophorectomy             (b) [place] and estradiol levels June 2020 show she is pre menopausaul  METASTATIC DISEASE: (7) presenting 11/29/2020 with progressive back pain:  (a) non-contrast CT abd/pelvis (renal stone study) 11/29/2020 shows bone metastases  (b) CT chest/abd/pelvis with contrast 11/30/2020 shows multiple large liver masses,  multiple pulmonary nodules, and widespread lytic bone lesions  (c) MRI brain shows at least 4 brain lesions, largest 3.8 cm  (d) total spinal MRI 11/30/2020 shows pathologic fractures at T9, T12, possibly L2, as well as numerous other spine lesions, but no epidural enhancement or cord compression  (e) liver biopsy 12/02/2020    Plan:  Monica [name] is aware that all her studies are consistent with widespread metastatic disease most likely from her breast cancer.  She understands that stage IV breast cancer is not curable.  It is treatable, and the treatment will depend on whether the cancer is still HER2 positive, and /or estrogen receptor positive.  The systemic treatment of HER2 positive disease generally focuses on immunotherapy; the systemic treatment of her estrogen receptor positive  disease would include antiestrogens and a CDK 4, 6 inhibitor; the systemic treatment of triple negative disease includes chemotherapy and immunotherapy.  She is scheduled for liver biopsy today.  We should have those results by Thursday and that will help determine our treatment plan  In addition to that she has central nervous system involvement.  She understands that most of the treatments I have to offer do not cross the blood-brain barrier.  She will need radiation for this and I have placed a radiation oncology consult to begin that discussion.  She is very forward-looking and is already thinking about disability application and how to make it at home--she lives in a second floor apartment with no elevator.  Her pain is currently much better controlled but she is developing itching and her pain medications may need to be changed as a result.  She will need*a bowel prophylaxis program.  We also discussed healthcare power of attorney issues and she could complete those papers during this admission.  There is a consult pending from palliative care and they could assist with these issues as well.  I will follow closely  with you during the admission   Chauncey Cruel, MD 12/02/2020  8:53 AM Medical Oncology and Hematology [facility] [address] [place], [place] [address] Tel. [phone]    Fax. [phone]

## 2020-12-02 NOTE — Progress Notes (Signed)
PROGRESS NOTE    Monica [name]  ZJI:[id number] DOB: 08/25/1970 DOA: 11/30/2020 PCP: [name], PA-C    Brief Narrative:  Monica [name] was admitted to the hospital with working diagnosis of acute on chronic back pain due to newly diagnosed diffuse bony spine metastatic disease, with lytic pathologic fractures (thoracic,lumbar and pelvis) in the setting of right breast cancer.  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  She presented with right-sided back pain/flank pain for about 3 months, worsening over the last week but more severe over the last about 16 hours that prompted her to come to the hospital.  Apparently she was diagnosed with nephrolithiasis and received oral analgesics as an outpatient.  Despite outpatient medical therapy she continued to have severe pain to the point where she was not able to ambulate.  On her initial physical examination blood pressure 118/82, heart rate 58, respirate 18, oxygen saturation 100%.  Her lungs are clear to auscultation bilaterally, heart S1-S2, present, rhythmic, soft abdomen, no lower extremity edema, midline tenderness of the lumbar vertebrae, lumbar muscular tender to palpation.  Negative straight leg raising bilaterally but severe pain with active right hip flexion.  Sodium 137, potassium 3.9, chloride 105, bicarb 25, glucose 94, BUN 16, creatinine 0.8, white count 8.7, hemoglobin 0.9, hematocrit 36.6, platelets 253. SARS COVID-19 negative.  Urinalysis specific gravity 1.009, 0-5 red cells, 6-10 white cells.  Renal CT scan with diffuse bony metastatic disease with lytic features and associated pathologic fractures in the thoracic, lumbar spine and pelvis.  Spiculated nodules within the chest.  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  Large hepatic mass at least 10 total lesions.  Cystic area in left adnexa.   Pathologic fracture of the superior endplate on the right L4 with endplate concavity, mild endplate concavity at this level.   Nonobstructive left nephrolithiasis.  Brain MRI with multiple metastatic lesions in the brain, the largest cyst in the right posterior temporal lobe, show cystic change and hemorrhage.  No midline shift.  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  Patient has been placed on analgesics.  Underwent liver biopsy for further diagnostics.  Plan for SRS to the brain on 09/26 and surgical resection of the largest lesion on 09/27.    Assessment & Plan:   Active Problems:   Cancer related pain   Bone metastases ([place])   Metastatic malignant neoplasm ([place])   Brain metastases ([place])     1. Triple positive breast cancer stage 1B, now with evidence of diffuse metastatic disease to the spine (thoracic-lumbar and pelvis), brain, liver and lungs. Back pain is controlled with acetaminophen, tizanidine and tramadol.  Continue with as needed hydromorphone IV for sever pain.  Tolerating well systemic steroids.   Patient had liver biopsy today for further diagnostics and possible treatment plan per Oncology Dr Jana Hakim.   Pending brain MRI with contrast.  For her brain lesion plan for resection prior SRS per radiation oncology.  No headache, no nausea or vomiting,    2. DVT and GI prophylaxis. Continue pantoprazole.    Status is: Inpatient  Remains inpatient appropriate because:Inpatient level of care appropriate due to severity of illness   Dispo: The patient is from: Home              Anticipated d/c is to: Home              Patient currently is not medically stable to d/c.  Difficult to place patient No   DVT prophylaxis: scd  / enoxaparin Code Status:   full  Family Communication:  No family at the bedside    Consultants:   Neurosurgery  IR  Oncology   Procedures:   Liver biopsy       Subjective: Patient reports pain controlled with  analgesics, no nausea or vomiting, no headache. No chest pain or dyspnea.   Objective: Vitals:   12/02/20 1406 12/02/20 1410 12/02/20 1415 12/02/20 1420  BP:  115/73 95/68 94/61   Pulse: 63 62 (!) 58 (!) 59  Resp: 16 11 10  (!) 9  Temp:      TempSrc:      SpO2: 97% 94% 98% 99%  Weight:      Height:        Intake/Output Summary (Last 24 hours) at 12/02/2020 1438 Last data filed at 12/02/2020 1236 Gross per 24 hour  Intake 270 ml  Output --  Net 270 ml   Filed Weights   11/30/20 1043 11/30/20 1638  Weight: 82.1 kg 83.2 kg    Examination:   General: Not in pain or dyspnea  Neurology: Awake and alert, non focal  E ENT: no pallor, no icterus, oral mucosa moist Cardiovascular: No JVD. S1-S2 present, rhythmic, no gallops, rubs, or murmurs. No lower extremity edema. Pulmonary: positive breath sounds bilaterally, Gastrointestinal. Abdomen soft and non tender Skin. No rashes Musculoskeletal: no joint deformities     Data Reviewed: I have personally reviewed following labs and imaging studies  CBC: Recent Labs  Lab 11/30/20 1153 12/01/20 0700 12/02/20 0612  WBC 8.7 7.9 12.2*  NEUTROABS 5.9  --   --   HGB 11.9* 11.8* 12.1  HCT 36.6 36.9 37.1  MCV 93.1 94.4 92.3  PLT 253 259 606   Basic Metabolic Panel: Recent Labs  Lab 11/30/20 1153 12/01/20 0700  NA 137 141  K 3.9 4.3  CL 105 107  CO2 25 29  GLUCOSE 94 93  BUN 16 11  CREATININE 0.80 0.74  CALCIUM 9.6 9.6   GFR: Estimated Creatinine Clearance: 92.5 mL/min (by C-G formula based on SCr of 0.74 mg/dL). Liver Function Tests: Recent Labs  Lab 11/30/20 1153  AST 39  ALT 34  ALKPHOS 85  BILITOT 0.4  PROT 6.9  ALBUMIN 3.5   No results for input(s): LIPASE, AMYLASE in the last 168 hours. No results for input(s): AMMONIA in the last 168 hours. Coagulation Profile: Recent Labs  Lab 12/02/20 0612  INR 1.0   Cardiac Enzymes: No results for input(s): CKTOTAL, CKMB, CKMBINDEX, TROPONINI in the last 168  hours. BNP (last 3 results) No results for input(s): PROBNP in the last 8760 hours. HbA1C: No results for input(s): HGBA1C in the last 72 hours. CBG: No results for input(s): GLUCAP in the last 168 hours. Lipid Profile: No results for input(s): CHOL, HDL, LDLCALC, TRIG, CHOLHDL, LDLDIRECT in the last 72 hours. Thyroid Function Tests: No results for input(s): TSH, T4TOTAL, FREET4, T3FREE, THYROIDAB in the last 72 hours. Anemia Panel: No results for input(s): VITAMINB12, FOLATE, FERRITIN, TIBC, IRON, RETICCTPCT in the last 72 hours.    Radiology Studies: I have reviewed all of the imaging during this hospital visit personally     Scheduled Meds: . dexamethasone  4 mg Oral BID WC  . enoxaparin (LOVENOX) injection  40 mg Subcutaneous Q24H  . fentaNYL      . gelatin adsorbable      . lidocaine  1 patch Transdermal Q24H  .  lidocaine-EPINEPHrine      . midazolam      . pantoprazole  40 mg Oral Daily  . polyethylene glycol  17 g Oral Daily  . senna  1 tablet Oral Daily   Continuous Infusions:   LOS: 2 days        Merric Yost Gerome Apley, MD

## 2020-12-02 NOTE — Progress Notes (Signed)
Palliative:  HPI: 50 y.o. female  with past medical history of right breast cancer (follows [facility]), arthritis in back admitted on 11/30/2020 with weakness, back pain, flank pain and found to have metastatic disease to bone, liver, lung, brain.   I met today at [facility] again. We reviewed plans for [facility] Thursday followed by surgical resection Friday. Brenda states that she is intrigued by this process but not really concerned. She trusts her medical team to develop the right plan for her. She does feel good that there are plans to begin treatment and biopsy done and that she is not sent home with lots of outpatient appointments to have to follow up and wait for a plan. She is aware that this is not curable but she is hopeful for results from treatment to give her time and allow her to have more time and hopeful to have improved quality of life so she can continue to enjoy painting, kayaking, etc.   She reports continued good relief of pain. She does complain of some mild itching but this is tolerable for her. She reports continued good relief of her back pain but did awaken with headache last night. No changes needed to pain regimen at this time. She has still not had bowel movement. I will add scheduled Miralax to take after she has completed bedrest after liver biopsy.   We also discussed HCPOA/Living Will and the importance of completing. Ethelyne reports that Dr. Jana Hakim gave her these materials last year but she has just put this off. She knows this is something important for her to complete. We discussed that she should choose her HCPOA to be the person that she trusts to make the decisions she would make for herself. I encouraged that her HCPOA should be a person that she can talk to about her thoughts, feelings, wishes along the way so they are able to make decisions on her behalf based on her own wishes. Dilynn received this advice and would like to consider her options further. I did recommend that we  solidify HCPOA prior to her treatment and surgery at the end of the week as she has 2 adult children, a significant other, and 7 siblings.   All questions/concerns addressed. Emotional support provided.   Exam: Alert, oriented. No distress. Breathing regular, unlabored. Abd flat, soft, tender post biopsy. Moves all extremities.   Plan: - Continues in good spirits.  - Hopeful to pursue treatments with hopes of improvement in quantity of life and maintaining quality of life.  - Consider her wishes for HCPOA.   [place], NP Palliative Medicine Team Pager [phone] (Please see [email or web address] for schedule) Team Phone [phone]    Greater than 50%  of this time was spent counseling and coordinating care related to the above assessment and plan

## 2020-12-02 NOTE — Procedures (Signed)
Interventional Radiology Procedure Note  Procedure: US BX LEFT LIVER MET    Complications: None  Estimated Blood Loss:  MIN  Findings: 18 G CORES X 3    M. TREVOR , MD    

## 2020-12-03 ENCOUNTER — Encounter: Payer: Self-pay | Admitting: Radiation Oncology

## 2020-12-03 ENCOUNTER — Ambulatory Visit (HOSPITAL_COMMUNITY): Payer: No Typology Code available for payment source

## 2020-12-03 ENCOUNTER — Ambulatory Visit
Admit: 2020-12-03 | Discharge: 2020-12-03 | Disposition: A | Discharge status: Home / Self Care | Payer: Self-pay | Source: Ambulatory Visit | Attending: Radiation Oncology | Admitting: Radiation Oncology

## 2020-12-03 DIAGNOSIS — Z510 Encounter for antineoplastic radiation therapy: Secondary | ICD-10-CM | POA: Insufficient documentation

## 2020-12-03 DIAGNOSIS — C50211 Malignant neoplasm of upper-inner quadrant of right female breast: Secondary | ICD-10-CM | POA: Insufficient documentation

## 2020-12-03 DIAGNOSIS — C7931 Secondary malignant neoplasm of brain: Secondary | ICD-10-CM

## 2020-12-03 LAB — CANCER ANTIGEN 27.29

## 2020-12-03 LAB — CA 125: Cancer Antigen (CA) 125: 205 U/mL — ABNORMAL HIGH (ref 0.0–38.1)

## 2020-12-03 LAB — CANCER ANTIGEN 15-3: CA 15-3: 255 U/mL — ABNORMAL HIGH (ref 0.0–25.0)

## 2020-12-03 MED ORDER — HYDROMORPHONE HCL 1 MG/ML IJ SOLN
1.0000 mg | Freq: Once | INTRAMUSCULAR | Status: AC
  Administered 2020-12-03: 1 mg via INTRAVENOUS
  Filled 2020-12-03: qty 1

## 2020-12-03 NOTE — Progress Notes (Addendum)
PROGRESS NOTE    Monica [name]  ZOX:[id number] DOB: [birth year] DOA: 11/30/2020 PCP: [name], PA-C    Brief Narrative:  Mrs. Monica [name] admitted to the hospital with working diagnosis of acute on chronic back pain due to newly diagnosed diffuse bonyspinemetastatic disease,with lyticpathologic fractures (thoracic,lumbar and pelvis)in the setting of right breast cancer.  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 She presented with right-sided back pain/flank pain for about 3 months, worsening over the last week but more severe over the last about 16 hours that prompted her to come to the hospital. Apparently she was diagnosed with nephrolithiasis and received oral analgesics as an outpatient. Despite outpatient medical therapy she continued to have severe pain to the point where she was not able to ambulate. On her initial physical examination blood pressure 118/82, heart rate 58, respirate 18, oxygen saturation 100%. Her lungs are clear to auscultation bilaterally, heart S1-S2, present, rhythmic, soft abdomen, no lower extremity edema, midline tenderness of the lumbar vertebrae, lumbar muscular tender to palpation. Negative straight leg raising bilaterally but severe pain with active right hip flexion.  Sodium 137, potassium 3.9, chloride 105, bicarb 25, glucose 94, BUN 16, creatinine 0.8, white count 8.7, hemoglobin 0.9, hematocrit 36.6, platelets 253. SARS COVID-19 negative.  Urinalysis specific gravity 1.009, 0-5 red cells, 6-10 white cells.  Renal CT scan with diffuse bony metastatic disease with lytic features and associated pathologic fractures in the thoracic, lumbar spine and pelvis. Spiculated nodules within the chest. 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 Large hepatic mass at least 10 total lesions. Cystic area in left adnexa.  Pathologic fracture of the superior endplate on the right L4 with endplate concavity, mild endplate concavity at this level.  Nonobstructive left nephrolithiasis.  Brain MRI with multiple metastatic lesions in the brain, the largest cyst in the right posterior temporal lobe, show cystic change and hemorrhage. No midline shift. 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  Patient has been placed on analgesics.  Underwent liver biopsy for further diagnostics 05/23.   Plan for SRS to the brain on 09/26 and surgical resection of the largest lesion on 09/27.     Assessment & Plan:   Active Problems:   Cancer related pain   Bone metastases ([place])   Metastatic malignant neoplasm ([place])   Brain metastases ([place])     1. Triple positive breast cancer stage 1B, now with evidence of diffuse metastatic disease to the spine (thoracic-lumbar and pelvis), brain, liver and lungs. Brain MRI with multiple brain metastasis.   Continue pain control with acetaminophen, tizanidine and tramadol.  On hydromorphone IV for sever pain.  Continue with systemic steroids. Marland Kitchen   Sp liver biopsy, pending pathology.   Plan for large brain lesion resection prior SRS per radiation oncology.   2. Reactive leukocytosis. Wbc 12,2 likely due to steroids, no indication for antibiotic therapy.    3. DVT and GI prophylaxis. On enoxaparin and pantoprazole      Status is: Inpatient  Remains inpatient appropriate because:Inpatient level of care appropriate due to severity of illness   Dispo: The patient is from: Home              Anticipated d/c is to: Home              Patient currently is not medically stable to d/c.   Difficult to place patient No   DVT prophylaxis: Enoxaparin   Code Status:  full  Family Communication:  No family at the bedside     Consultants:   Neurosurgery   Oncology  Radiation Oncology   IR   Procedures:   Liver biopsy     Subjective: Patient with positive  back pain, improved with analgesics, no nausea or vomiting, no chest pain or dyspnea, no headache.   Objective: Vitals:   12/03/20 0229 12/03/20 0623 12/03/20 0943 12/03/20 1325  BP: 125/77 134/82 137/87 136/85  Pulse: (!) 54 (!) 55 67 (!) 57  Resp: 17 17 17 16   Temp: 98.2 F (36.8 C) 97.7 F (36.5 C) 98.4 F (36.9 C) 98.2 F (36.8 C)  TempSrc: Oral Oral Oral Oral  SpO2: 99% 98% 99% 98%  Weight:  85.1 kg    Height:        Intake/Output Summary (Last 24 hours) at 12/03/2020 1647 Last data filed at 12/03/2020 1326 Gross per 24 hour  Intake 1960 ml  Output --  Net 1960 ml   Filed Weights   11/30/20 1043 11/30/20 1638 12/03/20 0623  Weight: 82.1 kg 83.2 kg 85.1 kg    Examination:   General: Not in pain or dyspnea, deconditioned  Neurology: Awake and alert, non focal  E ENT: mild pallor, no icterus, oral mucosa moist Cardiovascular: No JVD. S1-S2 present, rhythmic, no gallops, rubs, or murmurs. No lower extremity edema. Pulmonary: positive breath sounds bilaterally, adequate air movement, no wheezing, rhonchi or rales. Gastrointestinal. Abdomen soft and non tender Skin. No rashes Musculoskeletal: no joint deformities     Data Reviewed: I have personally reviewed following labs and imaging studies  CBC: Recent Labs  Lab 11/30/20 1153 12/01/20 0700 12/02/20 0612  WBC 8.7 7.9 12.2*  NEUTROABS 5.9  --   --   HGB 11.9* 11.8* 12.1  HCT 36.6 36.9 37.1  MCV 93.1 94.4 92.3  PLT 253 259 299   Basic Metabolic Panel: Recent Labs  Lab 11/30/20 1153 12/01/20 0700  NA 137 141  K 3.9 4.3  CL 105 107  CO2 25 29  GLUCOSE 94 93  BUN 16 11  CREATININE 0.80 0.74  CALCIUM 9.6 9.6   GFR: Estimated Creatinine Clearance: 93.5 mL/min (by C-G formula based on SCr of 0.74 mg/dL). Liver Function Tests: Recent Labs  Lab 11/30/20 1153  AST 39  ALT 34  ALKPHOS 85  BILITOT 0.4  PROT 6.9  ALBUMIN 3.5   No results for input(s): LIPASE, AMYLASE in the last 168 hours. No  results for input(s): AMMONIA in the last 168 hours. Coagulation Profile: Recent Labs  Lab 12/02/20 0612  INR 1.0   Cardiac Enzymes: No results for input(s): CKTOTAL, CKMB, CKMBINDEX, TROPONINI in the last 168 hours. BNP (last 3 results) No results for input(s): PROBNP in the last 8760 hours. HbA1C: No results for input(s): HGBA1C in the last 72 hours. CBG: No results for input(s): GLUCAP in the last 168 hours. Lipid Profile: No results for input(s): CHOL, HDL, LDLCALC, TRIG, CHOLHDL, LDLDIRECT in the last 72 hours. Thyroid Function Tests: No results for input(s): TSH, T4TOTAL, FREET4, T3FREE, THYROIDAB in the last 72 hours. Anemia Panel: No results for input(s): VITAMINB12, FOLATE, FERRITIN, TIBC, IRON, RETICCTPCT in the last 72 hours.    Radiology Studies: I have reviewed all of the imaging during this hospital visit personally     Scheduled Meds: . dexamethasone  4 mg Oral BID WC  . enoxaparin (LOVENOX) injection  40 mg Subcutaneous Q24H  . lidocaine  1 patch Transdermal Q24H  .  pantoprazole  40 mg Oral Daily  . polyethylene glycol  17 g Oral Daily  . senna  1 tablet Oral Daily   Continuous Infusions:   LOS: 3 days        Mariadejesus Cade Gerome Apley, MD

## 2020-12-03 NOTE — Progress Notes (Signed)
I spoke with the patient this morning after Dr. Zada Finders and Dr. [name] have reviewed her 3T MRI scan. Dr. Posey Pronto feels that there are 10 lesions in the brain, two of which are not definitively characteristic of disease in images 231 and 232, so the count is 8 lesions that are felt to represent disease. The doctors are in agreement that preoperative SRS is still an option versus whole brain radiation. The patient is interested in proceeding with preoperative SRS and is aware of the possible need to come back and with SRS salvage other small sites that could progress in the future in order to avoid the toxicities of whole brain radiation. We will proceed with simulation today to her brain, and on Thursday Mark and Start her spine. Her son is currently stationed in [place] with the [employer] and will be reaching out to me  for a letter to the [employer] so he can be released from duty on leave to come home and be with her during her recovery.     Carola Rhine, PAC

## 2020-12-04 ENCOUNTER — Other Ambulatory Visit: Payer: Self-pay | Admitting: Radiation Therapy

## 2020-12-04 MED ORDER — OXYCODONE HCL 5 MG PO TABS
10.0000 mg | ORAL_TABLET | ORAL | Status: DC | PRN
  Administered 2020-12-04 – 2020-12-08 (×8): 10 mg via ORAL
  Filled 2020-12-04 (×11): qty 2

## 2020-12-04 MED ORDER — SENNOSIDES-DOCUSATE SODIUM 8.6-50 MG PO TABS
2.0000 | ORAL_TABLET | Freq: Two times a day (BID) | ORAL | Status: DC
Start: 2020-12-04 — End: 2020-12-10
  Administered 2020-12-04 – 2020-12-10 (×12): 2 via ORAL
  Filled 2020-12-04 (×12): qty 2

## 2020-12-04 MED ORDER — ALPRAZOLAM 0.25 MG PO TABS
0.2500 mg | ORAL_TABLET | Freq: Every day | ORAL | Status: DC | PRN
  Administered 2020-12-05 – 2020-12-09 (×4): 0.25 mg via ORAL
  Filled 2020-12-04 (×4): qty 1

## 2020-12-04 MED ORDER — MORPHINE SULFATE (PF) 4 MG/ML IV SOLN
4.0000 mg | Freq: Once | INTRAVENOUS | Status: AC | PRN
  Administered 2020-12-05: 4 mg via INTRAVENOUS
  Filled 2020-12-04: qty 1

## 2020-12-04 NOTE — Plan of Care (Signed)
  Problem: Education: Goal: Knowledge of General Education information will improve Description: Including pain rating scale, medication(s)/side effects and non-pharmacologic comfort measures Outcome: Progressing   Problem: Health Behavior/Discharge Planning: Goal: Ability to manage health-related needs will improve Outcome: Progressing   Problem: Clinical Measurements: Goal: Ability to maintain clinical measurements within normal limits will improve Outcome: Progressing Goal: Will remain free from infection Outcome: Progressing   Problem: Activity: Goal: Risk for activity intolerance will decrease Outcome: Progressing   Problem: Nutrition: Goal: Adequate nutrition will be maintained Outcome: Progressing   Problem: Coping: Goal: Level of anxiety will decrease Outcome: Progressing   Problem: Pain Managment: Goal: General experience of comfort will improve Outcome: Progressing   Problem: Safety: Goal: Ability to remain free from injury will improve Outcome: Progressing

## 2020-12-04 NOTE — Progress Notes (Signed)
Pt alert and aware sitting up in bed. I introduced myself and explained the AD process to her. I answered all her questions and completed the paperwork. The chaplain offered caring and supportive presence and blessings.Marland Kitchen

## 2020-12-04 NOTE — Progress Notes (Signed)
Monica [name]   DOB:04/08/1971   YQ#:[id number]   GLO#:[id number]  Subjective:  Monica [name] is very pleased with her NSU/XRT team and is preparing herself mentally for the upcoming surgery. She still has pain and weakness in the R leg limiting mobility. Still has significant itching, likely from the dilaudid. Pain otherwise well-controlled. Has discussed HCPOA with son and plans to make him her 50. She is wondering if she will need a hospital bed when she goes home late next week-- always planning ahead! Had one BM yesterday "finally"  Objective: White woman examined in bed Vitals:   12/03/20 2218 12/04/20 0600  BP: 108/63 137/86  Pulse: (!) 54 (!) 54  Resp: 17 17  Temp: 98.4 F (36.9 C) 98.2 F (36.8 C)  SpO2: 97% 99%    Body mass index is 29.11 kg/m.  Intake/Output Summary (Last 24 hours) at 12/04/2020 0807 Last data filed at 12/03/2020 1326 Gross per 24 hour  Intake 480 ml  Output --  Net 480 ml      CBG (last 3)  No results for input(s): GLUCAP in the last 72 hours.   Labs:  Lab Results  Component Value Date   WBC 12.2 (H) 12/02/2020   HGB 12.1 12/02/2020   HCT 37.1 12/02/2020   MCV 92.3 12/02/2020   PLT 274 12/02/2020   NEUTROABS 5.9 11/30/2020    '@LASTCHEMISTRY' @  Urine Studies No results for input(s): UHGB, CRYS in the last 72 hours.  Invalid input(s): UACOL, UAPR, USPG, UPH, UTP, UGL, UKET, UBIL, UNIT, UROB, ULEU, UEPI, UWBC, URBC, UBAC, CAST, UCOM, BILUA  Basic Metabolic Panel: Recent Labs  Lab 11/30/20 1153 12/01/20 0700  NA 137 141  K 3.9 4.3  CL 105 107  CO2 25 29  GLUCOSE 94 93  BUN 16 11  CREATININE 0.80 0.74  CALCIUM 9.6 9.6   GFR Estimated Creatinine Clearance: 91.7 mL/min (by C-G formula based on SCr of 0.74 mg/dL). Liver Function Tests: Recent Labs  Lab 11/30/20 1153  AST 39  ALT 34  ALKPHOS 85  BILITOT 0.4  PROT 6.9  ALBUMIN 3.5   No results for input(s): LIPASE, AMYLASE in the last 168 hours. No results for input(s): AMMONIA in  the last 168 hours. Coagulation profile Recent Labs  Lab 12/02/20 0612  INR 1.0    CBC: Recent Labs  Lab 11/30/20 1153 12/01/20 0700 12/02/20 0612  WBC 8.7 7.9 12.2*  NEUTROABS 5.9  --   --   HGB 11.9* 11.8* 12.1  HCT 36.6 36.9 37.1  MCV 93.1 94.4 92.3  PLT 253 259 274   Cardiac Enzymes: No results for input(s): CKTOTAL, CKMB, CKMBINDEX, TROPONINI in the last 168 hours. BNP: Invalid input(s): POCBNP CBG: No results for input(s): GLUCAP in the last 168 hours. D-Dimer No results for input(s): DDIMER in the last 72 hours. Hgb A1c No results for input(s): HGBA1C in the last 72 hours. Lipid Profile No results for input(s): CHOL, HDL, LDLCALC, TRIG, CHOLHDL, LDLDIRECT in the last 72 hours. Thyroid function studies No results for input(s): TSH, T4TOTAL, T3FREE, THYROIDAB in the last 72 hours.  Invalid input(s): FREET3 Anemia work up No results for input(s): VITAMINB12, FOLATE, FERRITIN, TIBC, IRON, RETICCTPCT in the last 72 hours. Microbiology Recent Results (from the past 240 hour(s))  Resp Panel by RT-PCR (Flu A&B, Covid) Nasopharyngeal Swab     Status: None   Collection Time: 11/30/20  3:13 PM   Specimen: Nasopharyngeal Swab; Nasopharyngeal(NP) swabs in vial transport medium  Result Value Ref Range  Status   SARS Coronavirus 2 by RT PCR NEGATIVE NEGATIVE Final    Comment: (NOTE) SARS-CoV-2 target nucleic acids are NOT DETECTED.  The SARS-CoV-2 RNA is generally detectable in upper respiratory specimens during the acute phase of infection. The lowest concentration of SARS-CoV-2 viral copies this assay can detect is 138 copies/mL. A negative result does not preclude SARS-Cov-2 infection and should not be used as the sole basis for treatment or other patient management decisions. A negative result may occur with  improper specimen collection/handling, submission of specimen other than nasopharyngeal swab, presence of viral mutation(s) within the areas targeted by this  assay, and inadequate number of viral copies(<138 copies/mL). A negative result must be combined with clinical observations, patient history, and epidemiological information. The expected result is Negative.  Fact Sheet for Patients:  EntrepreneurPulse.com.au  Fact Sheet for Healthcare Providers:  IncredibleEmployment.be  This test is no t yet approved or cleared by the [place] FDA and  has been authorized for detection and/or diagnosis of SARS-CoV-2 by FDA under an Emergency Use Authorization (EUA). This EUA will remain  in effect (meaning this test can be used) for the duration of the COVID-19 declaration under Section 564(b)(1) of the Act, 21 U.S.C.section 360bbb-3(b)(1), unless the authorization is terminated  or revoked sooner.       Influenza A by PCR NEGATIVE NEGATIVE Final   Influenza B by PCR NEGATIVE NEGATIVE Final    Comment: (NOTE) The Xpert Xpress SARS-CoV-2/FLU/RSV plus assay is intended as an aid in the diagnosis of influenza from Nasopharyngeal swab specimens and should not be used as a sole basis for treatment. Nasal washings and aspirates are unacceptable for Xpert Xpress SARS-CoV-2/FLU/RSV testing.  Fact Sheet for Patients: EntrepreneurPulse.com.au  Fact Sheet for Healthcare Providers: IncredibleEmployment.be  This test is not yet approved or cleared by the [place] FDA and has been authorized for detection and/or diagnosis of SARS-CoV-2 by FDA under an Emergency Use Authorization (EUA). This EUA will remain in effect (meaning this test can be used) for the duration of the COVID-19 declaration under Section 564(b)(1) of the Act, 21 U.S.C. section 360bbb-3(b)(1), unless the authorization is terminated or revoked.  Performed at [facility], [place] [address] [place], [place] [address]       Studies:  MR BRAIN W WO CONTRAST  Result Date:  12/02/2020 CLINICAL DATA:  Metastatic breast cancer EXAM: MRI HEAD WITHOUT AND WITH CONTRAST TECHNIQUE: Multiplanar, multiecho pulse sequences of the brain and surrounding structures were obtained without and with intravenous contrast. CONTRAST:  57m GADAVIST GADOBUTROL 1 MMOL/ML IV SOLN COMPARISON:  11/30/2020 FINDINGS: Brain: Over the short interval, no change in multiple metastatic lesions identified on the prior study. Additional lesions are identified, some of which may be artifactual. Enhancing foci identified on series 1100, images 238, 231, 225, 199, 187, 166, 150, 135. There is no acute infarction or new hemorrhage. No worsening of mass effect. No hydrocephalus. Vascular: Major vessel flow voids at the skull base are preserved. Skull and upper cervical spine: Patchy areas of low T1 marrow signal in the calvarium and visualized upper cervical spine reflecting metastatic disease. Sinuses/Orbits: Paranasal sinuses are aerated. Orbits are unremarkable. Other: Sella is unremarkable.  Mastoid air cells are clear. IMPRESSION: Multiple brain metastases as detailed above and annotated on series 1100. Electronically Signed   By: PMacy MisM.D.   On: 12/02/2020 20:35   IR [place] Bx Asp/Drain  Result Date: 12/02/2020 INDICATION: Liver lesions, suspect metastatic breast cancer EXAM:  ULTRASOUND LEFT LIVER METASTASIS 18 GAUGE CORE BIOPSY MEDICATIONS: 1% LIDOCAINE LOCAL ANESTHESIA/SEDATION: Moderate (conscious) sedation was employed during this procedure. A total of Versed 2.0 mg and Fentanyl 100 mcg was administered intravenously. Moderate Sedation Time: 10 minutes. The patient's level of consciousness and vital signs were monitored continuously by radiology nursing throughout the procedure under my direct supervision. FLUOROSCOPY TIME:  Fluoroscopy Time: None. COMPLICATIONS: None immediate. PROCEDURE: Informed written consent was obtained from the patient after a thorough discussion of the procedural risks,  benefits and alternatives. All questions were addressed. Maximal Sterile Barrier Technique was utilized including caps, mask, sterile gowns, sterile gloves, sterile drape, hand hygiene and skin antiseptic. A timeout was performed prior to the initiation of the procedure. Previous imaging reviewed. Preliminary ultrasound performed. A left hepatic lobe hypoechoic solid lesion was localized and marked. Under sterile conditions and local anesthesia, a 17 gauge coaxial guide was advanced to the lesion. Needle position confirmed with ultrasound. Images obtained for documentation. Through the access, 18 gauge core biopsies obtained. These were placed in formalin. Sampling was intact and non fragmented. Needle tract occluded with Gel-Foam. No immediate complication. Postprocedure imaging demonstrates no hemorrhage or hematoma. IMPRESSION: Successful ultrasound left hepatic metastasis 18 gauge core biopsy Electronically Signed   By: Jerilynn Mages.  Shick M.D.   On: 12/02/2020 16:07    Assessment: 50 y.o. [place] woman admitted 11/29/2020 with evidence of widespread metastatic disease  (1) genetics testing 02/02/2018 though the CancerNext gene panel offered by [facility] showed no deleterious mutations in  APC, ATM, BARD1, BMPR1A,BRCA1, BRCA2, BRIP1, CDH1, CDK4, CDKN2A, CHEK2, DICER1, HOXB13, MLH1, MRE11A, MSH2, MSH6, MUTYH, NBN, NF1, PALB2, PMS2, POLD1, POLE, PTEN, RAD50, RAD51C, RAD51D, SMAD4, SMARCA4, STK11 and TP53 (sequencing and deletion/duplication); EPCAM and GREM1 (deletion/duplication only).             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               (a) Pertuzumab omitted after cycle 1 due to diarrhea.             (b) Gemcitabine substituted for docetaxel starting with cycle 5 due to peripheral neuropathy  (4) continued trastuzumab to total 6 months (through February 2020).             (a) echocardiogram 01/21/2018 showed an ejection fraction in the 55-60% range             (b) repeat echocardiogram 06/14/2018 showed an ejection fraction in the 60-65%  (5) adjuvant radiation 08/15/2018 - 09/28/2018 Site/dose:The patient initially received a dose of 50.4 Gy in 28 fractions to the rightbreast using whole-breast tangent fields. This was delivered using a 3-D conformal technique. The patient then received a boost to the seroma. This delivered an additional 10 Gy in 5 fractions using12E, 9Eelectrons with a special teletherapy technique. The total dose was 60.4 Gy.   (6) started tamoxifen March 2020             (a) the patient is status post hysterectomy, wihtout bilateral salpingo-oophorectomy             (  b) [place] and estradiol levels June 2020 show she is pre menopausaul  METASTATIC DISEASE: (7) presenting 11/29/2020 with progressive back pain:  (a) non-contrast CT abd/pelvis (renal stone study) 11/29/2020 shows bone metastases  (b) CT chest/abd/pelvis with contrast 11/30/2020 shows multiple large liver masses, multiple pulmonary nodules, and widespread lytic bone lesions  (c) MRI brain shows at least 4 brain lesions, largest 3.8 cm  (d) total spinal MRI 11/30/2020 shows pathologic fractures at T9, T12, possibly L2, as well as numerous other spine lesions, but no epidural enhancement or cord compression  (e) liver biopsy 12/02/2020 shows adenocarcinoma, prognostic panel pending  (f) pre-op repeat MRI 12/01/2020 shows additional brain lesions, at least 8 of which will be targets for SRS  (8) CNS treatment:  (a) SRS  (b) surgery  (9) systemic treatment:  prognostic panel pending  (10) advanced directives-- plans to name son as HCPOA; will place Chaplain consult to facilitate    Plan:  Brailey is still itchy, likely from the IV dilaudid which she is receiving every few hours. I will write for oxycodone and asked her to request it first for breakthrough pain (she says morphine was not working for her earlier). If she does not get relief from that she will request the dilaudid. Hopefully we can transition her to a pain regimen that will work at home and not cause the itching problem  Reviewed bowel prophylaxsis.  She thinks she may need a hospital bed at home and is preparing a room in her house for that. We will have to see how she responds to SRS/surgery but I assured her we could obtain the bed on short notice if needed  Appreciate the excellent care pt is receiving. Will follow with you   [place] Cruel, MD 12/04/2020  8:07 AM Medical Oncology and Hematology [facility] [address] [place], [place] [address] Tel. [phone]    Fax. [phone]

## 2020-12-04 NOTE — Progress Notes (Signed)
Palliative:  HPI: 50 y.o.femalewith past medical history of right breast cancer (follows Magrinat), arthritis in backadmitted on 5/21/2022with weakness, back pain, flank pain and found to have metastatic disease to bone, liver, lung, brain.   I met today again with Monica [name]. She is reading when I enter her room. She continues to be in good spirits. Her pain continues to be well managed and she is utilizing IV pain meds and admits that she agrees with medical advice to allow treatment of her pain appropriately and not to try and tough it out. She is having some itching from IV dilaudid but still has good relief from dilaudid. We discussed a one time trial of IV morphine (higher dose than previously when it was ineffective for relief) so we know what she tolerates best if she needs it over the coming days. She notes some relief from oxycodone but notes it takes longer to kick in. We discussed again that I am hopeful that she will have good relief from radiation treatment.   We discussed upcoming [facility] Thursday and open resection Friday. [name] appears to be coping well and feels as prepared as possible going into these procedures. I will add Xanax daily as needed as she notes she did have some anxiety with simulation in preparation for [facility] and she may benefit from this tomorrow prior to [facility].   Louise has completed HCPOA with her son as primary HCPOA and her father as secondary. She is hopeful for good results from [facility], surgery, and back radiation to improve both her quality and quantity of life. I did share that she continues to be very functional and this makes me optimistic for her tolerance and recovery from interventions ahead. I wish her the best and my thoughts and prayers are with her over the coming days.   All questions/concerns addressed. Emotional support provided.   Exam: Alert, oriented. No distress. Breathing regular, unlabored. Abd soft, flat. LBM 12/03/20. Moves all extremities.    Plan: - Continues in good spirits.  - Hopeful to pursue treatments with hopes of improvement in quantity of life and maintaining quality of life.  - Trial of morphine to see if better tolerated than dilaudid. Agree with oxycodone that can used at home.   [place], NP Palliative Medicine Team Pager [phone] (Please see [email or web address] for schedule) Team Phone [phone]    Greater than 50%  of this time was spent counseling and coordinating care related to the above assessment and plan

## 2020-12-04 NOTE — Progress Notes (Signed)
PROGRESS NOTE    Clarence Dunsmore  EUM:[id number] DOB: Jun 17, 1971 DOA: 11/30/2020 PCP: [name], PA-C    Brief Narrative:  [name] was diagnosed with early stage breast cancer in 2019, treated with lumpectomy, followed by chemotherapy, XRT and started tamoxifen in March 2020. -Was admitted with worsening right-sided back and flank pain for 3 months, imaging on admission noted extensive bony metastasis, large multiple liver masses, multiple pulmonary lung nodules and widespread lytic bony lesions, MRI brain noted at least 4 brain lesions, largest being 3.8 cm, MRI total spine noted numerous spine lesions without epidural enhancement or cord compression -Liver biopsy 5/23 and IR noted adenocarcinoma -Neurosurgery and radiation oncology were consulted  -Plan for [facility] on 5/26 followed by surgical resection of the largest brain metastasis on 5/27 at [facility]   Assessment & Plan:    Widespread metastatic breast cancer New brain metastasis -Liver biopsy 5/23 consistent with adenocarcinoma -Continue dexamethasone -Radiation oncology and neurosurgery following  -plan for [facility] 5/26 -Plan for surgical resection of largest brain lesion (3.8 cm right posterior temporal lobe) on Friday by Dr. Zada Finders  Reactive leukocytosis. -likely due to steroids  DVT prophylaxis: Lovenox CODE STATUS full code Family communication: No family at bedside  Remains inpatient appropriate because:Inpatient level of care appropriate due to severity of illness  Dispo: The patient is from: Home              Anticipated d/c is to: Home              Patient currently is not medically stable to d/c.   Difficult to place patient No  Consultants:   Neurosurgery   Oncology  Radiation Oncology   IR   Procedures:   Liver biopsy     Subjective: -Feels okay overall, no specific complaints, occasionally prepared for [facility] tomorrow followed by surgery Friday  Objective: Vitals:   12/03/20 1325 12/03/20  2218 12/04/20 0600 12/04/20 1407  BP: 136/85 108/63 137/86 134/83  Pulse: (!) 57 (!) 54 (!) 54 (!) 58  Resp: 16 17 17 16   Temp: 98.2 F (36.8 C) 98.4 F (36.9 C) 98.2 F (36.8 C) 98.3 F (36.8 C)  TempSrc: Oral Oral Oral Oral  SpO2: 98% 97% 99% 94%  Weight:   81.8 kg   Height:        Intake/Output Summary (Last 24 hours) at 12/04/2020 1511 Last data filed at 12/04/2020 1433 Gross per 24 hour  Intake 120 ml  Output --  Net 120 ml   Filed Weights   11/30/20 1638 12/03/20 0623 12/04/20 0600  Weight: 83.2 kg 85.1 kg 81.8 kg    Examination:   General: Pleasant Young female, sitting up in bed, AAOx3, no distress HEENT: No JVD CVS: S1-S2, regular rate rhythm Lungs: Clear bilaterally Abdomen: Soft, nontender, bowel sounds present Extremities: No edema Neuro: Moves all extremities, no localizing signs   Data Reviewed: I have personally reviewed following labs and imaging studies  CBC: Recent Labs  Lab 11/30/20 1153 12/01/20 0700 12/02/20 0612  WBC 8.7 7.9 12.2*  NEUTROABS 5.9  --   --   HGB 11.9* 11.8* 12.1  HCT 36.6 36.9 37.1  MCV 93.1 94.4 92.3  PLT 253 259 540   Basic Metabolic Panel: Recent Labs  Lab 11/30/20 1153 12/01/20 0700  NA 137 141  K 3.9 4.3  CL 105 107  CO2 25 29  GLUCOSE 94 93  BUN 16 11  CREATININE 0.80 0.74  CALCIUM 9.6 9.6   GFR: Estimated Creatinine  Clearance: 91.7 mL/min (by C-G formula based on SCr of 0.74 mg/dL). Liver Function Tests: Recent Labs  Lab 11/30/20 1153  AST 39  ALT 34  ALKPHOS 85  BILITOT 0.4  PROT 6.9  ALBUMIN 3.5   No results for input(s): LIPASE, AMYLASE in the last 168 hours. No results for input(s): AMMONIA in the last 168 hours. Coagulation Profile: Recent Labs  Lab 12/02/20 0612  INR 1.0   Cardiac Enzymes: No results for input(s): CKTOTAL, CKMB, CKMBINDEX, TROPONINI in the last 168 hours. BNP (last 3 results) No results for input(s): PROBNP in the last 8760 hours. HbA1C: No results for input(s):  HGBA1C in the last 72 hours. CBG: No results for input(s): GLUCAP in the last 168 hours. Lipid Profile: No results for input(s): CHOL, HDL, LDLCALC, TRIG, CHOLHDL, LDLDIRECT in the last 72 hours. Thyroid Function Tests: No results for input(s): TSH, T4TOTAL, FREET4, T3FREE, THYROIDAB in the last 72 hours. Anemia Panel: No results for input(s): VITAMINB12, FOLATE, FERRITIN, TIBC, IRON, RETICCTPCT in the last 72 hours.  Scheduled Meds: . dexamethasone  4 mg Oral BID WC  . enoxaparin (LOVENOX) injection  40 mg Subcutaneous Q24H  . lidocaine  1 patch Transdermal Q24H  . pantoprazole  40 mg Oral Daily  . polyethylene glycol  17 g Oral Daily  . senna-docusate  2 tablet Oral BID   Continuous Infusions:   LOS: 4 days        Domenic Polite, MD

## 2020-12-05 ENCOUNTER — Encounter: Payer: Self-pay | Admitting: Radiation Oncology

## 2020-12-05 ENCOUNTER — Ambulatory Visit: Payer: Self-pay | Admitting: Radiation Oncology

## 2020-12-05 ENCOUNTER — Ambulatory Visit
Admit: 2020-12-05 | Discharge: 2020-12-05 | Disposition: A | Discharge status: Home / Self Care | Payer: Self-pay | Attending: Radiation Oncology | Admitting: Radiation Oncology

## 2020-12-05 ENCOUNTER — Encounter: Payer: Self-pay | Admitting: Oncology

## 2020-12-05 DIAGNOSIS — C50211 Malignant neoplasm of upper-inner quadrant of right female breast: Secondary | ICD-10-CM

## 2020-12-05 DIAGNOSIS — Z515 Encounter for palliative care: Secondary | ICD-10-CM

## 2020-12-05 LAB — BASIC METABOLIC PANEL
Anion gap: 11 (ref 5–15)
BUN: 16 mg/dL (ref 6–20)
CO2: 28 mmol/L (ref 22–32)
Calcium: 9.6 mg/dL (ref 8.9–10.3)
Chloride: 101 mmol/L (ref 98–111)
Creatinine, Ser: 0.58 mg/dL (ref 0.44–1.00)
GFR, Estimated: >60 mL/min (ref >60)
Glucose, Bld: 86 mg/dL (ref 70–99)
Potassium: 3.7 mmol/L (ref 3.5–5.1)
Sodium: 140 mmol/L (ref 135–145)

## 2020-12-05 LAB — CBC
HCT: 36.9 % (ref 36.0–46.0)
Hemoglobin: 12.1 g/dL (ref 12.0–15.0)
MCH: 30 pg (ref 26.0–34.0)
MCHC: 32.8 g/dL (ref 30.0–36.0)
MCV: 91.6 fL (ref 80.0–100.0)
Platelets: 284 10*3/uL (ref 150–400)
RBC: 4.03 MIL/uL (ref 3.87–5.11)
RDW: 11.5 % (ref 11.5–15.5)
WBC: 13 10*3/uL — ABNORMAL HIGH (ref 4.0–10.5)
nRBC: 0 % (ref 0.0–0.2)

## 2020-12-05 MED ORDER — HYDROMORPHONE HCL 1 MG/ML IJ SOLN
1.0000 mg | INTRAMUSCULAR | Status: AC | PRN
  Administered 2020-12-05 – 2020-12-06 (×2): 1 mg via INTRAVENOUS
  Filled 2020-12-05 (×2): qty 1

## 2020-12-05 MED ORDER — MORPHINE SULFATE (PF) 2 MG/ML IV SOLN
2.0000 mg | INTRAVENOUS | Status: DC | PRN
  Administered 2020-12-05 – 2020-12-06 (×6): 2 mg via INTRAVENOUS
  Filled 2020-12-05 (×6): qty 1

## 2020-12-05 NOTE — Progress Notes (Signed)
Palliative Medicine Progress Note  Per Alicia Parker NP Palliative Note->HPI:50 y.o.femalewith past medical history of right breast cancer (follows Magrinat), arthritis in backadmitted on 5/21/2022with weakness, back pain, flank pain and found to have metastatic disease to bone, liver, lung, brain.  Subjective: Medical records reviewed. Met at the bedside with Lorre to follow up on pain management and adverse effects. She reports moderate pain today, which she attributes to a leg molding procedure that she just completed. She remains in good spirits however, smiling and stating "I am going to laugh through this." We discussed yesterday's plan to try IV morphine for comparison of itching to IV dilaudid. Akiah confirms that she received 3 doses of IV dilaudid overnight and has not yet tried the IV morphine. She believes the itching is gradually improving and is tolerable given the adequate pain relief with IV dilaudid. She is willing to try the IV morphine today and reassess side effects tomorrow.   Discussed upcoming SRS and open resection. Jazzlyn speaks highly of her excellent support system and feels ready to proceed, hoping for the best possible outcomes. Emotional support provided.  Questions and concerns addressed. PMT will continue to support holistically.   Exam:  Alert and oriented x3. Pleasant, in no acute distress. Normal respiratory effort on room air. Moves all extremities spontaneously. Skin is warm and dry.  Plan: -Continue full code/full scope -Start trial of IV morphine today and continue to assess for toleration of adverse effects compared to IV dilaudid -Ongoing psychosocial and emotional support   15 minutes Greater than 50% of this time was spent in counseling and coordinating care related to the above assessment and plan.   , PA-C Palliative Medicine Team Team phone # [phone]  Thank you for allowing the Palliative Medicine Team to assist in  the care of this patient. Please utilize secure chat with additional questions, if there is no response within 30 minutes please call the above phone number.  Palliative Medicine Team providers are available by phone from 7am to 7pm daily and can be reached through the team cell phone.  Should this patient require assistance outside of these hours, please call the patient's attending physician. 

## 2020-12-05 NOTE — Progress Notes (Signed)
PROGRESS NOTE    Monica [name]  OJJ:[id number] DOB: [birth year] DOA: 11/30/2020 PCP: Monica Stalker, PA-C    Brief Narrative:  [name] was diagnosed with early stage breast cancer in 2019, treated with lumpectomy, followed by chemotherapy, XRT and started tamoxifen in March 2020. -Was admitted with worsening right-sided back and flank pain for 3 months, imaging on admission noted extensive bony metastasis, large multiple liver masses, multiple pulmonary lung nodules and widespread lytic bony lesions, MRI brain noted at least 4 brain lesions, largest being 3.8 cm, MRI total spine noted numerous spine lesions without epidural enhancement or cord compression -Liver biopsy 5/23 and IR noted adenocarcinoma -Neurosurgery and radiation oncology were consulted  -Plan for [facility] on 5/26 followed by surgical resection of the largest brain metastasis on 5/27 at [facility]   Assessment & Plan:    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 Zada [name]  Reactive leukocytosis. -likely due to steroids, afebrile and nontoxic  DVT prophylaxis: Discontinue Lovenox CODE STATUS full code Family communication: No family at bedside  Remains inpatient appropriate because:Inpatient level of care appropriate due to severity of illness  Dispo: The patient is from: Home              Anticipated d/c is to: Home              Patient currently is not medically stable to d/c.   Difficult to place patient No  Consultants:   Neurosurgery   Oncology  Radiation Oncology   IR   Procedures:   Liver biopsy   SRS today, radiation oncology pending    Subjective: -Feels okay overall, waiting for SRS  today -No specific complaints  Objective: Vitals:   12/04/20 1407 12/04/20 1830 12/04/20 2206 12/05/20 0557  BP: 134/83 (!) 145/104 109/64 120/72  Pulse: (!) 58 (!) 59 (!) 51 (!) 50  Resp: 16 16 17 17   Temp: 98.3 F (36.8 C) 98.2 F (36.8 C) 98.2 F (36.8 C) 98.2 F (36.8 C)  TempSrc: Oral Oral Oral Oral  SpO2: 94% 95% 96% 98%  Weight:    81.4 kg  Height:        Intake/Output Summary (Last 24 hours) at 12/05/2020 1246 Last data filed at 12/04/2020 1433 Gross per 24 hour  Intake 120 ml  Output --  Net 120 ml   Filed Weights   12/03/20 0623 12/04/20 0600 12/05/20 0557  Weight: 85.1 kg 81.8 kg 81.4 kg    Examination:   General: Pleasant young female sitting up in bed, AAOx3, no distress HEENT: No JVD, no icterus CVS: S1-S2, regular rate rhythm Lungs: Clear bilaterally Abdomen: Soft, nontender, bowel sounds present Extremities: No edema Neuro: Painful movement of right hip, flexion  Data Reviewed: I have personally reviewed following labs and imaging studies  CBC: Recent Labs  Lab 11/30/20 1153 12/01/20 0700 12/02/20 0612 12/05/20 0515  WBC 8.7 7.9 12.2* 13.0*  NEUTROABS 5.9  --   --   --   HGB 11.9* 11.8* 12.1 12.1  HCT 36.6 36.9 37.1 36.9  MCV 93.1 94.4 92.3 91.6  PLT 253 259 274 937   Basic Metabolic Panel: Recent Labs  Lab 11/30/20 1153 12/01/20 0700 12/05/20 0515  NA 137 141 140  K 3.9 4.3 3.7  CL 105 107 101  CO2 25 29 28   GLUCOSE 94 93 86  BUN 16 11 16   CREATININE 0.80 0.74 0.58  CALCIUM 9.6 9.6 9.6   GFR: Estimated Creatinine Clearance: 91.5 mL/min (by C-G formula based on SCr of 0.58 mg/dL). Liver Function Tests: Recent Labs  Lab 11/30/20 1153  AST 39  ALT 34  ALKPHOS 85  BILITOT 0.4  PROT 6.9  ALBUMIN 3.5   No results for input(s): LIPASE, AMYLASE in the last 168 hours. No results for input(s): AMMONIA in the last 168 hours. Coagulation Profile: Recent Labs  Lab 12/02/20 0612  INR 1.0   Cardiac Enzymes: No results for  input(s): CKTOTAL, CKMB, CKMBINDEX, TROPONINI in the last 168 hours. BNP (last 3 results) No results for input(s): PROBNP in the last 8760 hours. HbA1C: No results for input(s): HGBA1C in the last 72 hours. CBG: No results for input(s): GLUCAP in the last 168 hours. Lipid Profile: No results for input(s): CHOL, HDL, LDLCALC, TRIG, CHOLHDL, LDLDIRECT in the last 72 hours. Thyroid Function Tests: No results for input(s): TSH, T4TOTAL, FREET4, T3FREE, THYROIDAB in the last 72 hours. Anemia Panel: No results for input(s): VITAMINB12, FOLATE, FERRITIN, TIBC, IRON, RETICCTPCT in the last 72 hours.  Scheduled Meds: . dexamethasone  4 mg Oral BID WC  . enoxaparin (LOVENOX) injection  40 mg Subcutaneous Q24H  . lidocaine  1 patch Transdermal Q24H  . pantoprazole  40 mg Oral Daily  . polyethylene glycol  17 g Oral Daily  . senna-docusate  2 tablet Oral BID   Continuous Infusions:   LOS: 5 days    Domenic Polite, MD

## 2020-12-05 NOTE — Anesthesia Preprocedure Evaluation (Addendum)
Anesthesia Evaluation  Patient identified by MRN, date of birth, ID band Patient awake    Reviewed: Allergy & Precautions, NPO status , Patient's Chart, lab work & pertinent test results  History of Anesthesia Complications Negative for: history of anesthetic complications  Airway Mallampati: I  TM Distance: >3 FB Neck ROM: Full    Dental  (+) Dental Advisory Given, Teeth Intact   Pulmonary neg pulmonary ROS,    breath sounds clear to auscultation       Cardiovascular (-) angina Rhythm:Regular Rate:Normal  '19 ECHO: EF60-65%, no significnat valvular abnormalities   Neuro/Psych 4 large brain mets    GI/Hepatic negative GI ROS, Liver mets   Endo/Other  negative endocrine ROS  Renal/GU negative Renal ROS     Musculoskeletal  (+) Arthritis , Widespread bony mets   Abdominal   Peds  Hematology negative hematology ROS (+)   Anesthesia Other Findings Breast cancer: surgery, chemo, ,XRT  Reproductive/Obstetrics                            Anesthesia Physical Anesthesia Plan  ASA: III  Anesthesia Plan:    Post-op Pain Management:    Induction: Intravenous  PONV Risk Score and Plan: 2 and Ondansetron, Dexamethasone and Treatment may vary due to age or medical condition  Airway Management Planned: Natural Airway and Simple Face Mask  Additional Equipment: Arterial line  Intra-op Plan:   Post-operative Plan:   Informed Consent: I have reviewed the patients History and Physical, chart, labs and discussed the procedure including the risks, benefits and alternatives for the proposed anesthesia with the patient or authorized representative who has indicated his/her understanding and acceptance.     Dental advisory given  Plan Discussed with: CRNA and Surgeon  Anesthesia Plan Comments:        Anesthesia Quick Evaluation

## 2020-12-06 ENCOUNTER — Inpatient Hospital Stay (HOSPITAL_COMMUNITY): Payer: Self-pay | Admitting: Certified Registered Nurse Anesthetist

## 2020-12-06 ENCOUNTER — Encounter: Payer: Self-pay | Admitting: Oncology

## 2020-12-06 ENCOUNTER — Encounter (HOSPITAL_COMMUNITY)
Admission: EM | Disposition: A | Discharge status: Home / Self Care | Payer: Self-pay | Source: Home / Self Care | Attending: Neurological Surgery

## 2020-12-06 ENCOUNTER — Ambulatory Visit: Payer: Self-pay

## 2020-12-06 DIAGNOSIS — D496 Neoplasm of unspecified behavior of brain: Secondary | ICD-10-CM | POA: Diagnosis present

## 2020-12-06 HISTORY — PX: APPLICATION OF CRANIAL NAVIGATION: SHX6578

## 2020-12-06 HISTORY — PX: CRANIOTOMY: SHX93

## 2020-12-06 LAB — CBC
HCT: 37.1 % (ref 36.0–46.0)
Hemoglobin: 12.3 g/dL (ref 12.0–15.0)
MCH: 29.9 pg (ref 26.0–34.0)
MCHC: 33.2 g/dL (ref 30.0–36.0)
MCV: 90 fL (ref 80.0–100.0)
Platelets: 323 10*3/uL (ref 150–400)
RBC: 4.12 MIL/uL (ref 3.87–5.11)
RDW: 11.5 % (ref 11.5–15.5)
WBC: 14.1 10*3/uL — ABNORMAL HIGH (ref 4.0–10.5)
nRBC: 0 % (ref 0.0–0.2)

## 2020-12-06 LAB — BASIC METABOLIC PANEL
Anion gap: 7 (ref 5–15)
BUN: 17 mg/dL (ref 6–20)
CO2: 29 mmol/L (ref 22–32)
Calcium: 9.6 mg/dL (ref 8.9–10.3)
Chloride: 102 mmol/L (ref 98–111)
Creatinine, Ser: 0.78 mg/dL (ref 0.44–1.00)
GFR, Estimated: >60 mL/min (ref >60)
Glucose, Bld: 95 mg/dL (ref 70–99)
Potassium: 3.8 mmol/L (ref 3.5–5.1)
Sodium: 138 mmol/L (ref 135–145)

## 2020-12-06 LAB — PROTIME-INR
INR: 1 (ref 0.8–1.2)
Prothrombin Time: 13.1 seconds (ref 11.4–15.2)

## 2020-12-06 LAB — MRSA PCR SCREENING: MRSA by PCR: NEGATIVE

## 2020-12-06 LAB — PREPARE RBC (CROSSMATCH)

## 2020-12-06 SURGERY — CRANIOTOMY TUMOR EXCISION
Anesthesia: Monitor Anesthesia Care | Site: Head | Laterality: Right

## 2020-12-06 MED ORDER — 0.9 % SODIUM CHLORIDE (POUR BTL) OPTIME
TOPICAL | Status: DC | PRN
  Administered 2020-12-06 (×4): 1000 mL

## 2020-12-06 MED ORDER — LIDOCAINE 2% (20 MG/ML) 5 ML SYRINGE
INTRAMUSCULAR | Status: AC
  Filled 2020-12-06: qty 5

## 2020-12-06 MED ORDER — ONDANSETRON HCL 4 MG/2ML IJ SOLN
INTRAMUSCULAR | Status: AC
  Filled 2020-12-06: qty 2

## 2020-12-06 MED ORDER — THROMBIN 20000 UNITS EX SOLR: CUTANEOUS | Status: DC | PRN

## 2020-12-06 MED ORDER — POLYETHYLENE GLYCOL 3350 17 G PO PACK: 17.0000 g | PACK | Freq: Every day | ORAL | Status: DC | PRN

## 2020-12-06 MED ORDER — LEVETIRACETAM IN NACL 500 MG/100ML IV SOLN
500.0000 mg | INTRAVENOUS | Status: AC
  Administered 2020-12-06: 500 mg via INTRAVENOUS
  Filled 2020-12-06: qty 100

## 2020-12-06 MED ORDER — FENTANYL CITRATE (PF) 250 MCG/5ML IJ SOLN
INTRAMUSCULAR | Status: AC
  Filled 2020-12-06: qty 5

## 2020-12-06 MED ORDER — PROPOFOL 10 MG/ML IV BOLUS
INTRAVENOUS | Status: AC
  Filled 2020-12-06: qty 20

## 2020-12-06 MED ORDER — DOCUSATE SODIUM 100 MG PO CAPS
100.0000 mg | ORAL_CAPSULE | Freq: Two times a day (BID) | ORAL | Status: DC
  Administered 2020-12-06 – 2020-12-10 (×8): 100 mg via ORAL
  Filled 2020-12-06 (×8): qty 1

## 2020-12-06 MED ORDER — OXYCODONE HCL 5 MG PO TABS
5.0000 mg | ORAL_TABLET | Freq: Once | ORAL | Status: AC | PRN
  Administered 2020-12-06: 5 mg via ORAL

## 2020-12-06 MED ORDER — THROMBIN 20000 UNITS EX SOLR
CUTANEOUS | Status: AC
  Filled 2020-12-06: qty 20000

## 2020-12-06 MED ORDER — MIDAZOLAM HCL 2 MG/2ML IJ SOLN: 0.5000 mg | Freq: Once | INTRAMUSCULAR | Status: DC | PRN

## 2020-12-06 MED ORDER — HEPARIN SODIUM (PORCINE) 5000 UNIT/ML IJ SOLN
5000.0000 [IU] | Freq: Three times a day (TID) | INTRAMUSCULAR | Status: DC
  Administered 2020-12-08 – 2020-12-10 (×6): 5000 [IU] via SUBCUTANEOUS
  Filled 2020-12-06 (×6): qty 1

## 2020-12-06 MED ORDER — DEXAMETHASONE SODIUM PHOSPHATE 10 MG/ML IJ SOLN
INTRAMUSCULAR | Status: AC
  Filled 2020-12-06: qty 1

## 2020-12-06 MED ORDER — SODIUM CHLORIDE 0.9 % IV SOLN
0.0125 ug/kg/min | INTRAVENOUS | Status: AC
  Administered 2020-12-06: .05 ug/kg/min via INTRAVENOUS
  Filled 2020-12-06: qty 2000

## 2020-12-06 MED ORDER — DEXMEDETOMIDINE HCL IN NACL 400 MCG/100ML IV SOLN
INTRAVENOUS | Status: DC | PRN
  Administered 2020-12-06: .1 ug/kg/h via INTRAVENOUS

## 2020-12-06 MED ORDER — PROPOFOL 10 MG/ML IV BOLUS
INTRAVENOUS | Status: DC | PRN
  Administered 2020-12-06 (×2): 30 mg via INTRAVENOUS

## 2020-12-06 MED ORDER — HYDROMORPHONE HCL 1 MG/ML IJ SOLN
1.0000 mg | INTRAMUSCULAR | Status: DC | PRN
  Administered 2020-12-06 – 2020-12-08 (×9): 1 mg via INTRAVENOUS
  Filled 2020-12-06 (×9): qty 1

## 2020-12-06 MED ORDER — CEFAZOLIN SODIUM-DEXTROSE 2-4 GM/100ML-% IV SOLN
2.0000 g | Freq: Three times a day (TID) | INTRAVENOUS | Status: AC
  Administered 2020-12-06 (×2): 2 g via INTRAVENOUS
  Filled 2020-12-06 (×2): qty 100

## 2020-12-06 MED ORDER — FENTANYL CITRATE (PF) 100 MCG/2ML IJ SOLN
25.0000 ug | INTRAMUSCULAR | Status: DC | PRN
  Administered 2020-12-06 (×3): 50 ug via INTRAVENOUS

## 2020-12-06 MED ORDER — ONDANSETRON HCL 4 MG/2ML IJ SOLN
INTRAMUSCULAR | Status: DC | PRN
  Administered 2020-12-06: 4 mg via INTRAVENOUS

## 2020-12-06 MED ORDER — SODIUM CHLORIDE 0.9 % IV SOLN: INTRAVENOUS | Status: DC | PRN

## 2020-12-06 MED ORDER — OXYCODONE HCL 5 MG PO TABS
ORAL_TABLET | ORAL | Status: AC
  Filled 2020-12-06: qty 1

## 2020-12-06 MED ORDER — MEPERIDINE HCL 25 MG/ML IJ SOLN: 6.2500 mg | INTRAMUSCULAR | Status: DC | PRN

## 2020-12-06 MED ORDER — CHLORHEXIDINE GLUCONATE CLOTH 2 % EX PADS: 6.0000 | MEDICATED_PAD | Freq: Every day | CUTANEOUS | Status: DC

## 2020-12-06 MED ORDER — CHLORHEXIDINE GLUCONATE CLOTH 2 % EX PADS: 6.0000 | MEDICATED_PAD | Freq: Once | CUTANEOUS | Status: DC

## 2020-12-06 MED ORDER — DEXMEDETOMIDINE (PRECEDEX) IN NS 20 MCG/5ML (4 MCG/ML) IV SYRINGE
PREFILLED_SYRINGE | INTRAVENOUS | Status: DC | PRN
  Administered 2020-12-06: 4 ug via INTRAVENOUS

## 2020-12-06 MED ORDER — ONDANSETRON HCL 4 MG/2ML IJ SOLN
4.0000 mg | INTRAMUSCULAR | Status: DC | PRN
  Administered 2020-12-06 – 2020-12-10 (×11): 4 mg via INTRAVENOUS
  Filled 2020-12-06 (×11): qty 2

## 2020-12-06 MED ORDER — DEXAMETHASONE SODIUM PHOSPHATE 10 MG/ML IJ SOLN
INTRAMUSCULAR | Status: DC | PRN
  Administered 2020-12-06: 10 mg via INTRAVENOUS

## 2020-12-06 MED ORDER — LIDOCAINE-EPINEPHRINE 1 %-1:100000 IJ SOLN
INTRAMUSCULAR | Status: AC
  Filled 2020-12-06: qty 3

## 2020-12-06 MED ORDER — FENTANYL CITRATE (PF) 100 MCG/2ML IJ SOLN
INTRAMUSCULAR | Status: AC
  Filled 2020-12-06: qty 2

## 2020-12-06 MED ORDER — ONDANSETRON HCL 4 MG PO TABS
4.0000 mg | ORAL_TABLET | ORAL | Status: DC | PRN
  Administered 2020-12-08 – 2020-12-09 (×2): 4 mg via ORAL
  Filled 2020-12-06 (×2): qty 1

## 2020-12-06 MED ORDER — PHENYLEPHRINE 40 MCG/ML (10ML) SYRINGE FOR IV PUSH (FOR BLOOD PRESSURE SUPPORT)
PREFILLED_SYRINGE | INTRAVENOUS | Status: DC | PRN
  Administered 2020-12-06: 40 ug via INTRAVENOUS

## 2020-12-06 MED ORDER — PROMETHAZINE HCL 12.5 MG PO TABS
12.5000 mg | ORAL_TABLET | ORAL | Status: DC | PRN
Start: 2020-12-06 — End: 2020-12-10
  Administered 2020-12-06: 25 mg via ORAL
  Filled 2020-12-06: qty 1

## 2020-12-06 MED ORDER — BACITRACIN ZINC 500 UNIT/GM EX OINT
TOPICAL_OINTMENT | CUTANEOUS | Status: AC
  Filled 2020-12-06: qty 28.35

## 2020-12-06 MED ORDER — BACITRACIN ZINC 500 UNIT/GM EX OINT
TOPICAL_OINTMENT | CUTANEOUS | Status: DC | PRN
  Administered 2020-12-06 (×2): 1 via TOPICAL

## 2020-12-06 MED ORDER — OXYCODONE HCL 5 MG/5ML PO SOLN: 5.0000 mg | Freq: Once | ORAL | Status: AC | PRN

## 2020-12-06 MED ORDER — LIDOCAINE-EPINEPHRINE 1 %-1:100000 IJ SOLN
INTRAMUSCULAR | Status: DC | PRN
  Administered 2020-12-06: 28 mL

## 2020-12-06 MED ORDER — THROMBIN 5000 UNITS EX SOLR
CUTANEOUS | Status: AC
  Filled 2020-12-06: qty 10000

## 2020-12-06 MED ORDER — PROMETHAZINE HCL 25 MG/ML IJ SOLN: 6.2500 mg | INTRAMUSCULAR | Status: DC | PRN

## 2020-12-06 MED ORDER — THROMBIN 5000 UNITS EX SOLR
CUTANEOUS | Status: AC
  Filled 2020-12-06: qty 15000

## 2020-12-06 MED ORDER — THROMBIN 5000 UNITS EX SOLR: OROMUCOSAL | Status: DC | PRN

## 2020-12-06 MED ORDER — MIDAZOLAM HCL 2 MG/2ML IJ SOLN
INTRAMUSCULAR | Status: AC
  Filled 2020-12-06: qty 2

## 2020-12-06 MED ORDER — CEFAZOLIN SODIUM-DEXTROSE 2-4 GM/100ML-% IV SOLN
2.0000 g | INTRAVENOUS | Status: AC
  Administered 2020-12-06: 2 g via INTRAVENOUS
  Filled 2020-12-06: qty 100

## 2020-12-06 MED ORDER — DEXMEDETOMIDINE HCL IN NACL 400 MCG/100ML IV SOLN
INTRAVENOUS | Status: AC
  Filled 2020-12-06: qty 200

## 2020-12-06 MED ORDER — LABETALOL HCL 5 MG/ML IV SOLN
10.0000 mg | INTRAVENOUS | Status: DC | PRN
  Administered 2020-12-06: 10 mg via INTRAVENOUS
  Filled 2020-12-06: qty 4

## 2020-12-06 SURGICAL SUPPLY — 90 items
BAND RUBBER #18 3X1/16 STRL (MISCELLANEOUS) ×6 IMPLANT
BENZOIN TINCTURE PRP APPL 2/3 (GAUZE/BANDAGES/DRESSINGS) IMPLANT
BLADE CLIPPER SURG (BLADE) ×3 IMPLANT
BLADE SAW GIGLI 16 STRL (MISCELLANEOUS) IMPLANT
BLADE SURG 15 STRL LF DISP TIS (BLADE) IMPLANT
BLADE SURG 15 STRL SS (BLADE)
BNDG GAUZE ELAST 4 BULKY (GAUZE/BANDAGES/DRESSINGS) IMPLANT
BNDG STRETCH 4X75 STRL LF (GAUZE/BANDAGES/DRESSINGS) IMPLANT
BUR ACORN 9.0 PRECISION (BURR) ×3 IMPLANT
BUR ROUND FLUTED 4 SOFT TCH (BURR) IMPLANT
BUR SPIRAL ROUTER 2.3 (BUR) ×3 IMPLANT
CANISTER SUCT 3000ML PPV (MISCELLANEOUS) ×6 IMPLANT
CATH VENTRIC 35X38 W/TROCAR LG (CATHETERS) IMPLANT
CLIP VESOCCLUDE MED 6/CT (CLIP) IMPLANT
CNTNR URN SCR LID CUP LEK RST (MISCELLANEOUS) ×2 IMPLANT
CONT SPEC 4OZ STRL OR WHT (MISCELLANEOUS) ×1
COVER MAYO STAND STRL (DRAPES) IMPLANT
COVER WAND RF STERILE (DRAPES) IMPLANT
DECANTER SPIKE VIAL GLASS SM (MISCELLANEOUS) ×3 IMPLANT
DISP BIPOLAR PROBE CORTI STIMU (MISCELLANEOUS) ×1
DRAIN SUBARACHNOID (WOUND CARE) IMPLANT
DRAPE HALF SHEET 40X57 (DRAPES) ×3 IMPLANT
DRAPE MICROSCOPE LEICA (MISCELLANEOUS) ×3 IMPLANT
DRAPE NEUROLOGICAL W/INCISE (DRAPES) ×3 IMPLANT
DRAPE STERI IOBAN 125X83 (DRAPES) IMPLANT
DRAPE SURG 17X23 STRL (DRAPES) IMPLANT
DRAPE WARM FLUID 44X44 (DRAPES) ×3 IMPLANT
DRSG ADAPTIC 3X8 NADH LF (GAUZE/BANDAGES/DRESSINGS) IMPLANT
DRSG TELFA 3X8 NADH (GAUZE/BANDAGES/DRESSINGS) IMPLANT
DURAPREP 6ML APPLICATOR 50/CS (WOUND CARE) ×3 IMPLANT
ELECT REM PT RETURN 9FT ADLT (ELECTROSURGICAL) ×3
ELECTRODE REM PT RTRN 9FT ADLT (ELECTROSURGICAL) ×2 IMPLANT
EVACUATOR 1/8 PVC DRAIN (DRAIN) IMPLANT
EVACUATOR SILICONE 100CC (DRAIN) IMPLANT
FORCEPS BIPOLAR SPETZLER 8 1.0 (NEUROSURGERY SUPPLIES) ×6 IMPLANT
GAUZE 4X4 16PLY RFD (DISPOSABLE) IMPLANT
GAUZE SPONGE 4X4 12PLY STRL (GAUZE/BANDAGES/DRESSINGS) IMPLANT
GLOVE BIO SURGEON STRL SZ7 (GLOVE) ×3 IMPLANT
GLOVE BIOGEL PI IND STRL 7.5 (GLOVE) ×4 IMPLANT
GLOVE BIOGEL PI INDICATOR 7.5 (GLOVE) ×2
GLOVE ECLIPSE 7.5 STRL STRAW (GLOVE) ×6 IMPLANT
GLOVE EXAM NITRILE LRG STRL (GLOVE) IMPLANT
GLOVE EXAM NITRILE XS STR PU (GLOVE) IMPLANT
GLOVE SURG UNDER POLY LF SZ7 (GLOVE) IMPLANT
GOWN STRL REUS W/ TWL LRG LVL3 (GOWN DISPOSABLE) ×6 IMPLANT
GOWN STRL REUS W/ TWL XL LVL3 (GOWN DISPOSABLE) IMPLANT
GOWN STRL REUS W/TWL 2XL LVL3 (GOWN DISPOSABLE) IMPLANT
GOWN STRL REUS W/TWL LRG LVL3 (GOWN DISPOSABLE) ×3
GOWN STRL REUS W/TWL XL LVL3 (GOWN DISPOSABLE)
HEMOSTAT POWDER KIT SURGIFOAM (HEMOSTASIS) ×3 IMPLANT
HEMOSTAT SURGICEL 2X14 (HEMOSTASIS) ×3 IMPLANT
IV NS 1000ML (IV SOLUTION)
IV NS 1000ML BAXH (IV SOLUTION) IMPLANT
KIT BASIN OR (CUSTOM PROCEDURE TRAY) ×3 IMPLANT
KIT DRAIN CSF ACCUDRAIN (MISCELLANEOUS) IMPLANT
KIT TURNOVER KIT B (KITS) ×3 IMPLANT
MARKER SPHERE PSV REFLC 13MM (MARKER) ×6 IMPLANT
NEEDLE HYPO 22GX1.5 SAFETY (NEEDLE) ×6 IMPLANT
NEEDLE SPNL 18GX3.5 QUINCKE PK (NEEDLE) IMPLANT
NS IRRIG 1000ML POUR BTL (IV SOLUTION) ×12 IMPLANT
PACK CRANIOTOMY CUSTOM (CUSTOM PROCEDURE TRAY) ×3 IMPLANT
PATTIES SURGICAL .25X.25 (GAUZE/BANDAGES/DRESSINGS) IMPLANT
PATTIES SURGICAL .5 X.5 (GAUZE/BANDAGES/DRESSINGS) IMPLANT
PATTIES SURGICAL .5 X3 (DISPOSABLE) IMPLANT
PATTIES SURGICAL 1/4 X 3 (GAUZE/BANDAGES/DRESSINGS) IMPLANT
PATTIES SURGICAL 1X1 (DISPOSABLE) IMPLANT
PIN MAYFIELD SKULL DISP (PIN) ×3 IMPLANT
PLATE DOUBLE Y CMF 6H (Plate) ×9 IMPLANT
PROBE BIPOLAR CORTI STIM NCS (MISCELLANEOUS) ×2 IMPLANT
SCREW UNIII AXS SD 1.5X4 (Screw) ×33 IMPLANT
SPECIMEN JAR SMALL (MISCELLANEOUS) IMPLANT
SPONGE NEURO XRAY DETECT 1X3 (DISPOSABLE) IMPLANT
SPONGE SURGIFOAM ABS GEL 100 (HEMOSTASIS) ×3 IMPLANT
STAPLER VISISTAT 35W (STAPLE) ×3 IMPLANT
STRIP PLATINUM NCS 1X4 (MISCELLANEOUS) ×1
STRIP PLATINUM NCS 1X4 NCS (MISCELLANEOUS) ×2 IMPLANT
SUT ETHILON 3 0 FSL (SUTURE) IMPLANT
SUT ETHILON 3 0 PS 1 (SUTURE) IMPLANT
SUT MNCRL AB 3-0 PS2 18 (SUTURE) ×3 IMPLANT
SUT MON AB 3-0 SH 27 (SUTURE)
SUT MON AB 3-0 SH27 (SUTURE) IMPLANT
SUT NURALON 4 0 TR CR/8 (SUTURE) ×3 IMPLANT
SUT SILK 0 TIES 10X30 (SUTURE) IMPLANT
SUT VIC AB 2-0 CP2 18 (SUTURE) ×3 IMPLANT
TOWEL GREEN STERILE (TOWEL DISPOSABLE) ×3 IMPLANT
TOWEL GREEN STERILE FF (TOWEL DISPOSABLE) ×3 IMPLANT
TRAY FOLEY MTR SLVR 16FR STAT (SET/KITS/TRAYS/PACK) ×3 IMPLANT
TUBE CONNECTING 12X1/4 (SUCTIONS) ×3 IMPLANT
UNDERPAD 30X36 HEAVY ABSORB (UNDERPADS AND DIAPERS) IMPLANT
WATER STERILE IRR 1000ML POUR (IV SOLUTION) ×3 IMPLANT

## 2020-12-06 NOTE — Anesthesia Procedure Notes (Signed)
Arterial Line Insertion Start/End5/27/2022 7:44 AM, 12/06/2020 7:54 AM Performed by: Duane Boston, MD  Patient location: Pre-op. Preanesthetic checklist: patient identified, IV checked, site marked, risks and benefits discussed, surgical consent, monitors and equipment checked, pre-op evaluation, timeout performed and anesthesia consent Lidocaine 1% used for infiltration brachial was placed Catheter size: 20 Fr Hand hygiene performed  and maximum sterile barriers used   Attempts: 1 Procedure performed using ultrasound guided technique. Ultrasound Notes:anatomy identified, no ultrasound evidence of intravascular and/or intraneural injection and image(s) printed for medical record Following insertion, dressing applied and Biopatch. Post procedure assessment: normal and unchanged  Patient tolerated the procedure well with no immediate complications.

## 2020-12-06 NOTE — Transfer of Care (Signed)
Immediate Anesthesia Transfer of Care Note  Patient: Monica [name]  Procedure(s) Performed: Right sided awake craniotomy for tumor resection (Right Head) APPLICATION OF CRANIAL NAVIGATION (N/A )  Patient Location: PACU  Anesthesia Type:MAC  Level of Consciousness: awake, alert , oriented and patient cooperative  Airway & Oxygen Therapy: Patient Spontanous Breathing and Patient connected to nasal cannula oxygen  Post-op Assessment: Report given to RN, Post -op Vital signs reviewed and stable, Patient moving all extremities X 4 and Patient able to stick tongue midline  Post vital signs: Reviewed and stable  Last Vitals:  Vitals Value Taken Time  BP 115/81 12/06/20 1103  Temp    Pulse 62 12/06/20 1105  Resp 16 12/06/20 1105  SpO2 99 % 12/06/20 1105  Vitals shown include unvalidated device data.  Last Pain:  Vitals:   12/06/20 0645  TempSrc:   PainSc: 3       Patients Stated Pain Goal: 2 (02/11/22 3612)  Complications: No complications documented.

## 2020-12-06 NOTE — Anesthesia Procedure Notes (Signed)
Procedure Name: MAC Date/Time: 12/06/2020 8:57 AM Performed by: Kathryne Hitch, CRNA Pre-anesthesia Checklist: Patient identified, Emergency Drugs available, Suction available and Patient being monitored Patient Re-evaluated:Patient Re-evaluated prior to induction Oxygen Delivery Method: Nasal cannula Induction Type: IV induction Placement Confirmation: positive ETCO2 Dental Injury: Teeth and Oropharynx as per pre-operative assessment

## 2020-12-06 NOTE — Progress Notes (Signed)
Neurosurgery Service Progress Note  Subjective: No acute events overnight, no new complaints   Objective: Vitals:   12/05/20 1429 12/05/20 1939 12/05/20 2334 12/06/20 0321  BP: 131/87 (!) 148/94 121/83 112/80  Pulse: 70 76 60 64  Resp: 16 17 18 18   Temp: 98 F (36.7 C) 99.3 F (37.4 C) 98.6 F (37 C) 98.2 F (36.8 C)  TempSrc: Oral Oral Oral Oral  SpO2: 96% 98% 98% 98%  Weight:      Height:        Physical Exam: AOx3, PERRL, EOMI, FS, TM, Strength 5/5 x4, SILTx4, no drift  Assessment & Plan: 50 y.o. LEFT HANDED woman w/ recurrent breast Ca with brain metastases s/p preop SRS, now presents for awake craniotomy and resection of R posterior temporal tumor.  -discussed r/b/a with pt, wishes to proceed, OR this morning for resection -4[facility] ICU post-op  Judith Part  12/06/20 7:07 AM

## 2020-12-06 NOTE — Brief Op Note (Signed)
12/06/2020  11:04 AM  PATIENT:  Monica [name]  50 y.o. female  PRE-OPERATIVE DIAGNOSIS:  Brain Mass  POST-OPERATIVE DIAGNOSIS:  Brain Mass  PROCEDURE:  Procedure(s) with comments: Right sided awake craniotomy for tumor resection (Right) APPLICATION OF CRANIAL NAVIGATION (N/A) - RM 20  SURGEON:  Surgeon(s) and Role:    * Gidget Quizhpi, Joyice Faster, MD - Primary  PHYSICIAN ASSISTANT:   ASSISTANTS: none   ANESTHESIA:   general  EBL:  150 mL   BLOOD ADMINISTERED:none  DRAINS: none   LOCAL MEDICATIONS USED:  LIDOCAINE   SPECIMEN:  Source of Specimen:  left temporal brain tumor  DISPOSITION OF SPECIMEN:  PATHOLOGY  COUNTS:  YES  TOURNIQUET:  * No tourniquets in log *  DICTATION: .Note written in EPIC  PLAN OF CARE: Admit to inpatient   PATIENT DISPOSITION:  PACU - hemodynamically stable.   Delay start of Pharmacological VTE agent (>24hrs) due to surgical blood loss or risk of bleeding: yes

## 2020-12-06 NOTE — Anesthesia Postprocedure Evaluation (Signed)
Anesthesia Post Note  Patient: Monica [name]  Procedure(s) Performed: Right sided awake craniotomy for tumor resection (Right Head) APPLICATION OF CRANIAL NAVIGATION (N/A )     Patient location during evaluation: PACU Anesthesia Type: MAC Level of consciousness: awake and alert, patient cooperative and oriented Pain management: pain level controlled Vital Signs Assessment: post-procedure vital signs reviewed and stable Respiratory status: spontaneous breathing, respiratory function stable and nonlabored ventilation Cardiovascular status: blood pressure returned to baseline and stable Postop Assessment: no apparent nausea or vomiting Anesthetic complications: no   No complications documented.  Last Vitals:  Vitals:   12/06/20 0321 12/06/20 1104  BP: 112/80 115/81  Pulse: 64 65  Resp: 18 (!) 28  Temp: 36.8 C   SpO2: 98% 99%    Last Pain:  Vitals:   12/06/20 0645  TempSrc:   PainSc: 3                  Ruqayyah Lute,E. Annette Bertelson

## 2020-12-06 NOTE — Op Note (Signed)
PATIENT: Monica [name]  DAY OF SURGERY: 12/06/20   PRE-OPERATIVE DIAGNOSIS:  Metastatic brain tumor, right posterior temporal   POST-OPERATIVE DIAGNOSIS:  Same   PROCEDURE:  Awake right temporal craniotomy for tumor resection, use of frameless stereotaxy, microscope, and intraoperative ECoG and language mapping   SURGEON:  Surgeon(s) and Role:    Judith Part, MD - Primary   ANESTHESIA: ETGA   BRIEF HISTORY: This is a 50 year old woman with a history of recurrent breast cancer, found to have multiple intracranial metastases including one large one that was too large to be effectively treated with SRS alone. I therefore recommended surgical resection with preop SRS. Given the patient's left hand dominance and her dysnomia, I recommended performing this in an awake (asleep / awake / asleep) fashion for speech mapping. This was discussed with the patient as well as risks, benefits, and alternatives and wished to proceed with surgery.   OPERATIVE DETAIL: The patient was taken to the operating room and placed on the OR table in the supine position. A formal time out was performed with two patient identifiers and confirmed the operative site. Sedation was given by the anesthesia team. Pin sites were anesthetized with local anesthetic and a posterior auricular block was performed for the incision. The [place] head holder was applied to the head and a registration array was attached to the [place]. This was co-registered with the patient's preoperative imaging, the fit appeared to be acceptable. Using frameless stereotaxy, the operative trajectory was planned and the incision was marked. Hair was clipped with surgical clippers over the incision and the area was then prepped and draped in a sterile fashion.  A linear incision was placed in the right retro-auricular region, planned with stereotaxy. A craniotomy flap was turned, dura was opened, and the patient was fully awakened for testing. A grid  was placed for after depolarization discharges and seizure monitoring. A bipolar electrode was used to stimulate the visible cortical surfaces at increasing amperage while having the patient perform different language tasks. No positive findings were found. A corticotomy was then made in an electrophysiologic safe zone and stereotaxy was used to guide the operative trajectory to the tumor. The tumor was dissected circumferentially. It was involved in the ventricle, an intraventricular vessel that was presumed to be the anterior choroidal was identified and protected with a cottonoid. Periodic language testing during resection did not produce any deficits. The tumor was then resected en bloc and sent to pathology. Hemostasis was obtained and confirmed. Checking with stereotaxy, there was some medial and anterior enhancement on brainlab that appeared to be normal edematous brain with overlying choroid and was therefore not resected.   The dura was closed and bone flap replaced with titanium plates and screws. All instrument and sponge counts were correct, the incision was then closed in layers. The [place] was removed, the patient's neurologic exam was repeated and intact. No apparent complications at the completion of the procedure.   EBL:  143mL   DRAINS: none   SPECIMENS: Right temporal brain tumor   Judith Part, MD 12/06/20 10:55 AM

## 2020-12-06 NOTE — Progress Notes (Addendum)
Patient arrived to [facility] ICU from PACU at 1443 VSS with no complications. No belongings at bedside but patient states that she had them before she went to OR. I called PACU and they said her belongings are at 3[facility]. I called 3[facility] and they will bring her belongings up as soon as they are able to.

## 2020-12-07 ENCOUNTER — Inpatient Hospital Stay (HOSPITAL_COMMUNITY): Payer: Self-pay

## 2020-12-07 IMAGING — MR MR HEAD WO/W CM
12 of 14 series · 40 of 48 positions shown · IV contrast (gadavist)
Comparison: [DATE].

CLINICAL DATA: Brain/CNS neoplasm status post resection of tumor.

EXAM:
MRI HEAD WITHOUT AND WITH CONTRAST
TECHNIQUE: Multiplanar, multiecho pulse sequences of the brain and surrounding
structures were obtained without and with intravenous contrast.
CONTRAST:  8mL GADAVIST GADOBUTROL 1 MMOL/ML IV SOLN

[Series 5: DWI · axial · 3.0mm · 0.88mm/px · z∈[-98,+53]mm · 7 of 104 slices shown (1 of 4)]
[im 1/104]
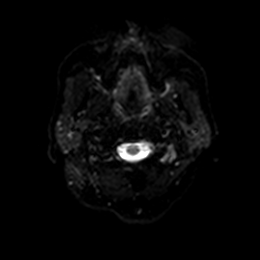
[im 18/104]
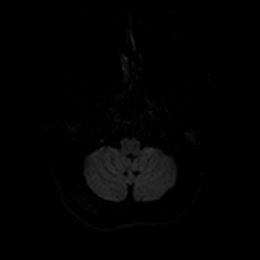
[im 35/104]
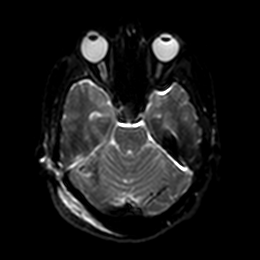
[im 52/104]
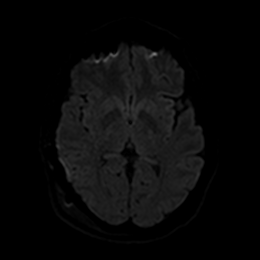
[im 69/104]
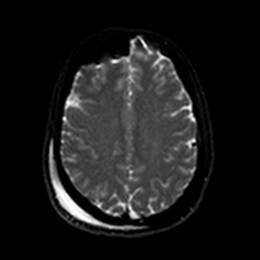
[im 86/104]
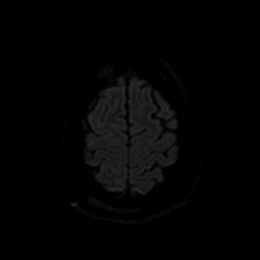
[im 104/104]
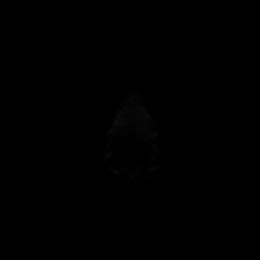

[Series 6: DWI · axial · 3.0mm · 0.88mm/px · z∈[-98,+53]mm · 4 of 52 slices shown (2 of 4)]
[im 1/52]
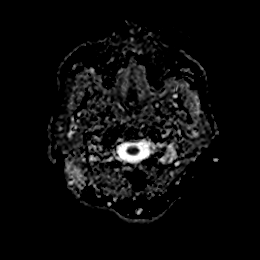
[im 18/52]
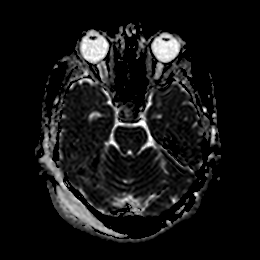
[im 35/52]
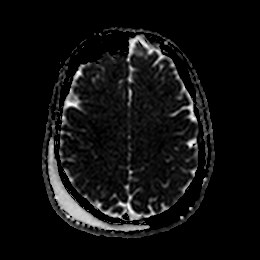
[im 52/52]
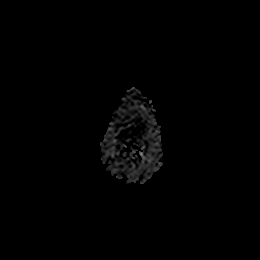

[Series 7: DWI · coronal · 4.0mm · 0.88mm/px · 6 of 76 slices shown (3 of 4)]
[im 1/76]
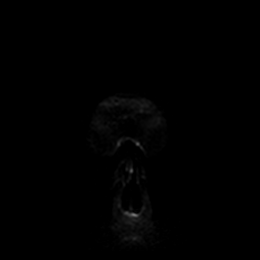
[im 16/76]
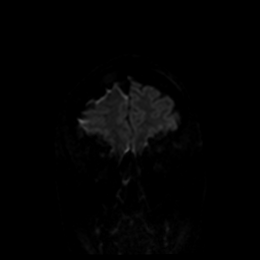
[im 31/76]
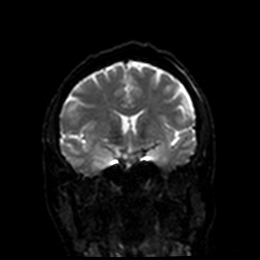
[im 46/76]
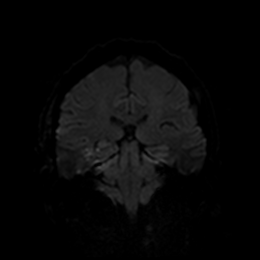
[im 61/76]
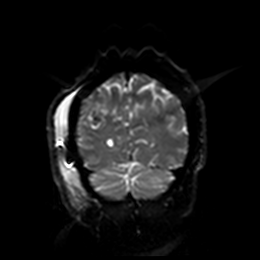
[im 76/76]
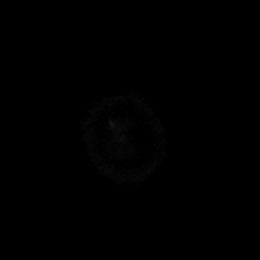

[Series 8: DWI · coronal · 4.0mm · 0.88mm/px · 3 of 38 slices shown (4 of 4)]
[im 1/38]
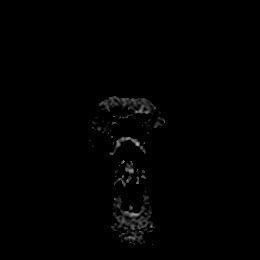
[im 19/38]
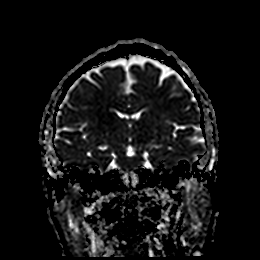
[im 38/38]
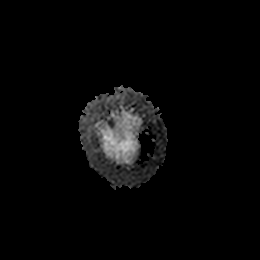

[Series 9: T1 · sagittal · 5.0mm · 0.75mm/px · 2 of 25 slices shown]
[im 1/25]
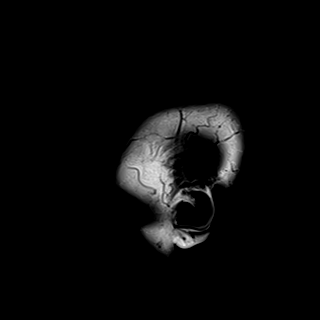
[im 25/25]
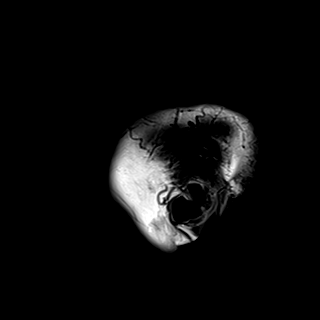

[Series 10: T2 · axial · 5.0mm · 0.72mm/px · z∈[-94,+48]mm · 2 of 25 slices shown]
[im 1/25]
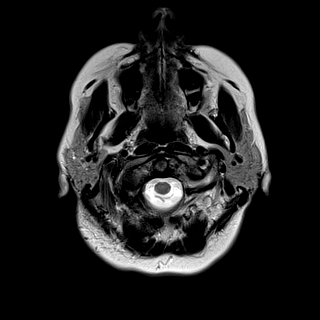
[im 25/25]
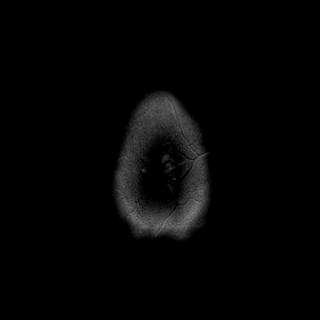

[Series 11: FLAIR · axial · 5.0mm · 0.45mm/px · z∈[-96,+46]mm · 2 of 25 slices shown]
[im 1/25]
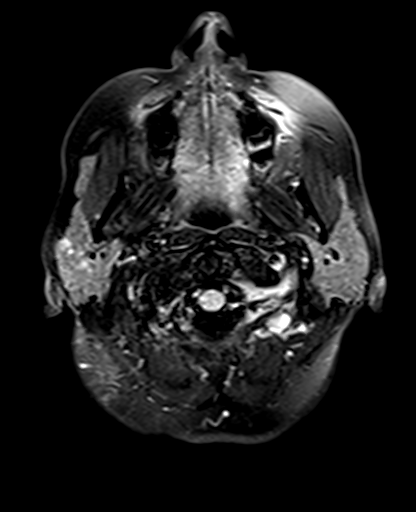
[im 25/25]
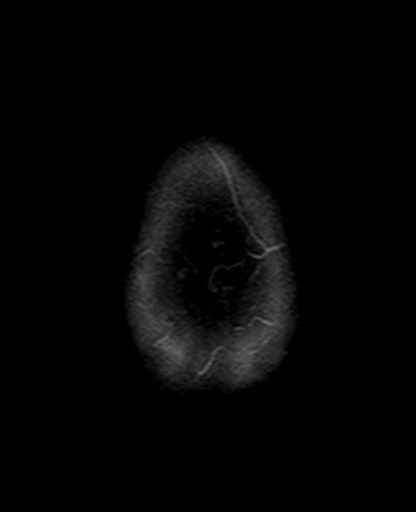

[Series 13: pha_images · axial · 3.0mm · 0.90mm/px · z∈[-106,+56]mm · 4 of 55 slices shown]
[im 1/55]
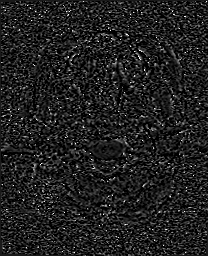
[im 19/55]
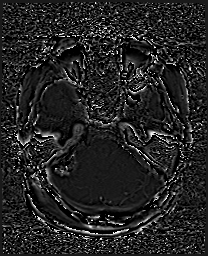
[im 37/55]
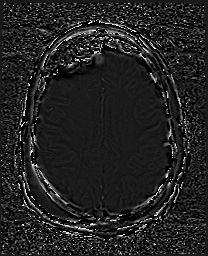
[im 55/55]
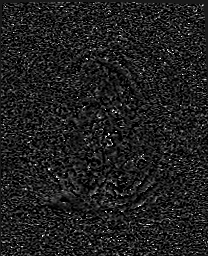

[Series 14: swi_images · axial · 3.0mm · 0.90mm/px · z∈[-106,+56]mm · 4 of 56 slices shown]
[im 1/56]
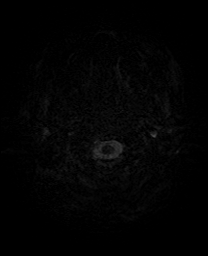
[im 19/56]
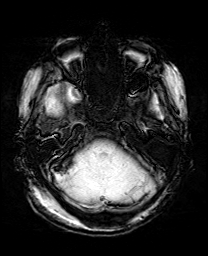
[im 37/56]
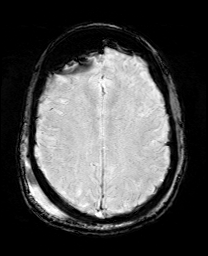
[im 56/56]
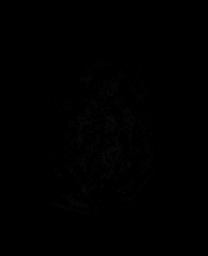

[Series 17: T2 post-contrast · coronal · 5.0mm · 0.72mm/px · 2 of 31 slices shown]
[im 1/31]
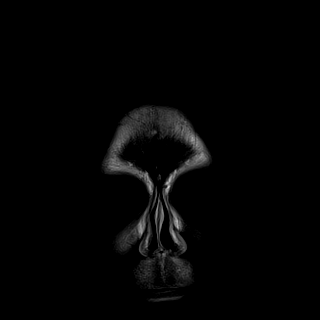
[im 31/31]
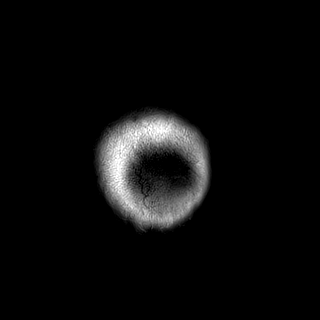

[Series 19: T1 post-contrast · coronal · 5.0mm · 0.34mm/px · 2 of 31 slices shown (1 of 2)]
[im 1/31]
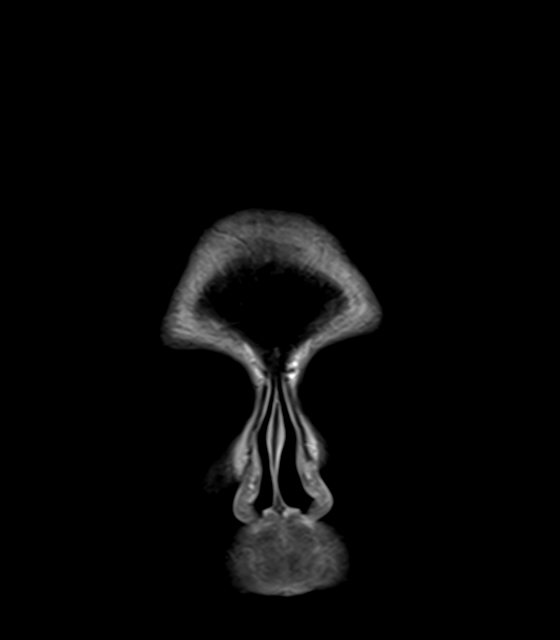
[im 31/31]
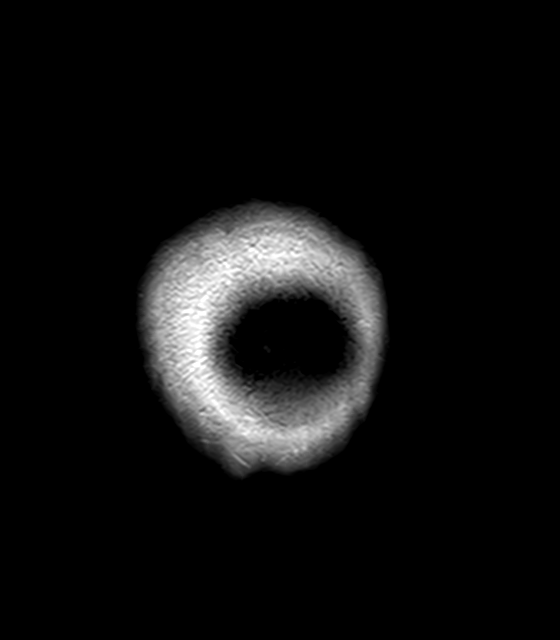

[Series 20: T1 post-contrast · sagittal · 5.0mm · 0.75mm/px · 2 of 25 slices shown (2 of 2)]
[im 1/25]
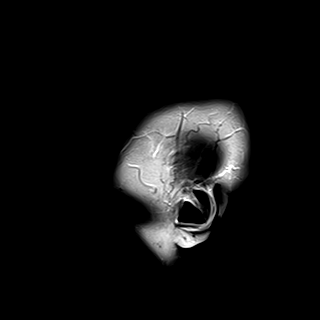
[im 25/25]
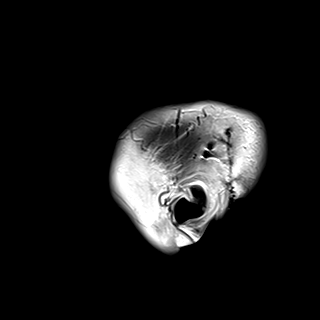

[40 of 48 positions shown; findings below may reference images not displayed]

FINDINGS: Brain: Interval resection of a lesion within the posterior right
temporal lobe. Expected postsurgical findings with small volume
hemorrhage and linear enhancement about the resection cavity and
mild peripheral restricted diffusion, compatible with devitalized
tissue. No masslike/nodular enhancement at the resection cavity to
suggest residual tumor. No mass occupying acute hemorrhage,
hydrocephalus, midline shift, or large extra-axial fluid collection.

Additional lesions are similar or less well visualized, including:

*Solid enhancing 10 mm lesion in the left cerebellum, similar to
prior.
*Additional punctate enhancing lesion in the more inferior left
cerebellum (series 18, image 13) is better seen today but likely was
present on the prior.
*Right paramidline cerebellar lesion was better seen on the prior.
*Similar 2.4 cm lesion in the right parietal lobe (series 18, image
34 and series 20, image 8).
*Left frontal lesion is poorly visualized but likely similar (series
18, image 40).
*Left perirolandic lesion is faintly visible (series 18, image 44).
*Right inferior basal ganglia lesion is faintly visible (series 18,
image 28).
*Additional small metastatic lesions were better characterized on
the recent MRI from [DATE]. Please see that study for further
characterization.

Vascular: Major arterial flow voids are maintained at the skull
base.

Skull and upper cervical spine: Right temporal craniotomy.
Redemonstrated hand sing lesions in bifrontal calvarium and left
parietal calvarium, compatible with metastatic disease and not
substantially changed.

Sinuses/Orbits: Clear sinuses.  Unremarkable orbits.

Other: No mastoid effusions.
IMPRESSION: 1. Interval resection of the dominant right posterior temporal lobe
lesion with expected postsurgical changes and no evidence of
residual tumor.
2. Additional lesions are similar or not as well seen on today
study, likely due to better technique/less motion on the prior.
There is one additional suspected punctate lesion in the inferior
left cerebellum (series 18, image 13) which is better characterized
on today's study but likely was present on the prior.
3. Similar calvarial metastases.

## 2020-12-07 MED ORDER — GADOBUTROL 1 MMOL/ML IV SOLN
8.0000 mL | Freq: Once | INTRAVENOUS | Status: AC | PRN
  Administered 2020-12-07: 8 mL via INTRAVENOUS

## 2020-12-07 NOTE — Progress Notes (Signed)
Physical Therapy Evaluation Patient Details Name: Monica [name] MRN: [id number] DOB: 03-20-1971 Today's Date: 12/07/2020   History of Present Illness  Pt is a 50 y.o. female who presented 5/21 with worsening R-sided back and flank pain. Pt found to have diffuse bony metastatic disease and L4 pathologic fx with hx of triple positive invasive ductal carcinoma of the right breast. imaging on admission noted extensive bony metastasis, large multiple liver masses, multiple pulmonary lung nodules and widespread lytic bony lesions, MRI brain noted at least 4 brain lesions, largest being 3.8 cm, MRI total spine noted numerous spine lesions without epidural enhancement or cord compression. 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 5/23. S/p R-sided awake crani for tumor resection 5/27. PMH breast CA   Clinical Impression  Pt presents with condition above and deficits mentioned below, see PT Problem List. PTA, she was independent and living alone in a 2nd-level apartment with a flight of stairs to enter/exit and a handrail on the R side if ascending. Currently, pt is experiencing pain in her head, back, L hip, and R anterior thigh which could be limiting her strength as she demonstrates generalized lower extremity weakness primarily on the R. In addition, pt displays some balance deficits and decreased activity tolerance. Pt was able to perform bed mobility with mod I, transfers with a RW and min guard assist, household distance ambulation with a RW and min guard-supervision, and  x7 stairs with 1 handrail with min guard + 2 for safety this date. Pt is having difficulty negotiating a full flight of stairs to enter/exit her home. In addition, she is ambulating at a very slow pace, suggesting she is at risk for falls. Pt is limited primarily by her pain and would benefit from follow-up Outpatient PT services to address her deficits and pain to maximize her safety and independence with all functional mobility.  Will continue to follow acutely.    Follow Up Recommendations Outpatient PT;Supervision for mobility/OOB    Equipment Recommendations  None recommended by PT (has necessary equipment)    Recommendations for Other Services       Precautions / Restrictions Precautions Precautions: Back Precaution Booklet Issued: No Precaution Comments: for comfort Required Braces or Orthoses:  (pt has a brace at home she ordered for comfort) Restrictions Weight Bearing Restrictions: No      Mobility  Bed Mobility Overal bed mobility: Modified Independent             General bed mobility comments: Pt performing log roll technique safely and transitioning to sit R EOB with use of bed rail, mod I. Pt already aware of techniques to maintain spinal precautions.    Transfers Overall transfer level: Needs assistance Equipment used: Rolling walker (2 wheeled) Transfers: Sit to/from Stand Sit to Stand: Min guard         General transfer comment: Min guard to come to stand from EOB 1x pulling up on RA and from toilet 1x using grab bar. Educated pt to push up from current surface to transfer to stand or reach back for target surface prior to sitting for safety. Educated pt verbally on use of breaks when utilizing her rollator at home.  Ambulation/Gait Ambulation/Gait assistance: Supervision;Min guard Gait Distance (Feet): 200 Feet Assistive device: Rolling walker (2 wheeled) Gait Pattern/deviations: Step-to pattern;Decreased step length - left;Decreased stance time - right;Decreased weight shift to right;Antalgic Gait velocity: reduced Gait velocity interpretation: <1.8 ft/sec, indicate of risk for recurrent falls General Gait Details: Pt initially displaying step-to  gait pattern with decreased R stance time and weight shift and thus decreased L step length. Pt with slow, guarded gait, no LOB, min guard-supervision for safety. Cued pt to correct and perform bil step-through, mod  success.  Stairs Stairs: Yes Stairs assistance: Min guard;+2 safety/equipment Stair Management: One rail Right;One rail Left;Step to pattern;Forwards Number of Stairs: 7 General stair comments: Ascends with R rail and descends with L rail, displaying step-to gait pattern, min guard+2 for safety. No overt LOB. Educated pt to lead up with strong/good leg (L) and down with weak/bad leg (R), success.  Wheelchair Mobility    Modified Rankin (Stroke Patients Only) Modified Rankin (Stroke Patients Only) Pre-Morbid Rankin Score: No symptoms Modified Rankin: Moderately severe disability     Balance Overall balance assessment: Needs assistance Sitting-balance support: No upper extremity supported;Feet supported Sitting balance-Leahy Scale: Good Sitting balance - Comments: Able to bring ankle up to contralateral leg to manage socks while reaching off BOS.   Standing balance support: No upper extremity supported;Bilateral upper extremity supported;During functional activity Standing balance-Leahy Scale: Fair Standing balance comment: Able to perform tasks reaching min off BOS at sink without UE support but relies on bil UE support on RW for mobility.                             Pertinent Vitals/Pain Pain Assessment: 0-10 Pain Score: 7  Pain Location: back of head, back, R anterior thigh, L hip Pain Descriptors / Indicators: Aching;Dull;Headache Pain Intervention(s): Limited activity within patient's tolerance;Monitored during session;Repositioned    Home Living Family/patient expects to be discharged to:: Private residence Living Arrangements: Alone Available Help at Discharge: Family;Available 24 hours/day Type of Home: Apartment (2nd floor) Home Access: Stairs to enter Entrance Stairs-Rails: Right (ascending) Entrance Stairs-Number of Steps: flight Home Layout: One level Home Equipment: Clinical cytogeneticist - 4 wheels;Cane - single point;Hand held shower head       Prior Function Level of Independence: Independent         Comments: Pt likes to do oil painting. 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 Pt drives.     Hand Dominance   Dominant Hand: Left    Extremity/Trunk Assessment   Upper Extremity Assessment Upper Extremity Assessment: Overall WFL for tasks assessed    Lower Extremity Assessment Lower Extremity Assessment: Generalized weakness;RLE deficits/detail RLE Deficits / Details: R leg pain and thus decreased strength grossly compared to L RLE Sensation:  (denies numbness/tingling) RLE Coordination: decreased gross motor    Cervical / Trunk Assessment Cervical / Trunk Assessment: Other exceptions Cervical / Trunk Exceptions: numerous spine lesions  Communication   Communication: No difficulties  Cognition Arousal/Alertness: Awake/alert Behavior During Therapy: WFL for tasks assessed/performed Overall Cognitive Status: Within Functional Limits for tasks assessed                                 General Comments: A&Ox4.      General Comments General comments (skin integrity, edema, etc.): Educated pt on need to contact apartment complex to try to switch her to a ground-level apartment.    Exercises     Assessment/Plan    PT Assessment Patient needs continued PT services  PT Problem List Decreased strength;Decreased range of motion;Decreased activity tolerance;Decreased balance;Decreased mobility;Decreased coordination;Decreased knowledge of use of DME;Decreased safety awareness;Decreased knowledge of precautions;Pain       PT Treatment Interventions DME  instruction;Gait training;Stair training;Functional mobility training;Therapeutic activities;Therapeutic exercise;Balance training;Neuromuscular re-education;Patient/family education    PT Goals (Current goals can be found in the Care Plan section)  Acute Rehab PT Goals Patient Stated Goal: to get better and go home PT Goal Formulation: With  patient Time For Goal Achievement: 12/21/20 Potential to Achieve Goals: Fair    Frequency Min 3X/week   Barriers to discharge        Co-evaluation PT/OT/SLP Co-Evaluation/Treatment: Yes Reason for Co-Treatment: For patient/therapist safety;To address functional/ADL transfers PT goals addressed during session: Mobility/safety with mobility;Balance;Proper use of DME         AM-PAC PT "6 Clicks" Mobility  Outcome Measure Help needed turning from your back to your side while in a flat bed without using bedrails?: A Little Help needed moving from lying on your back to sitting on the side of a flat bed without using bedrails?: A Little Help needed moving to and from a bed to a chair (including a wheelchair)?: A Little Help needed standing up from a chair using your arms (e.g., wheelchair or bedside chair)?: A Little Help needed to walk in hospital room?: A Little Help needed climbing 3-5 steps with a railing? : A Little 6 Click Score: 18    End of Session Equipment Utilized During Treatment: Gait belt Activity Tolerance: Patient tolerated treatment well Patient left: in bed;with call bell/phone within reach;with bed alarm set Nurse Communication: Mobility status PT Visit Diagnosis: Unsteadiness on feet (R26.81);Other abnormalities of gait and mobility (R26.89);Muscle weakness (generalized) (M62.81);Difficulty in walking, not elsewhere classified (R26.2);Pain Pain - Right/Left:  (headache, back, R anterior thigh, L hip) Pain - part of body: Hip (headache, back, R anterior thigh, L hip)    Time: 2010-0712 PT Time Calculation (min) (ACUTE ONLY): 51 min   Charges:   PT Evaluation $PT Eval Moderate Complexity: 1 Mod PT Treatments $Gait Training: 8-22 mins        Moishe Spice, PT, DPT Acute Rehabilitation Services  Pager: [phone] Office: [place] 12/07/2020, 1:00 PM

## 2020-12-07 NOTE — Evaluation (Signed)
Occupational Therapy Evaluation Patient Details Name: Monica [name] MRN: [id number] DOB: 1971/01/15 Today's Date: 12/07/2020    History of Present Illness Pt is a 50 y.o. female who presented 5/21 with worsening R-sided back and flank pain. Pt found to have diffuse bony metastatic disease and L4 pathologic fx with hx of triple positive invasive ductal carcinoma of the right breast. imaging on admission noted extensive bony metastasis, large multiple liver masses, multiple pulmonary lung nodules and widespread lytic bony lesions, MRI brain noted at least 4 brain lesions, largest being 3.8 cm, MRI total spine noted numerous spine lesions without epidural enhancement or cord compression. 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 5/23. S/p R-sided awake crani for tumor resection 5/27. PMH breast CA   Clinical Impression   Pt admitted with above. She demonstrates the below listed deficits and will benefit from continued OT to maximize safety and independence with BADLs.  Pt presents to OT with pain, decreased activity tolerance, impaired balance.  She currently requires min guard assist for ADLs and min guard assist for functional mobility.  She lives alone in second floor apartment, but reports her family will be staying with her upon discharge.  Will follow acutely, but recommend no OT at discharge.  Recommend that she consider moving into a first floor apartment, which was discussed with her.  Will follow.       Follow Up Recommendations  No OT follow up;Supervision - Intermittent    Equipment Recommendations  None recommended by OT    Recommendations for Other Services       Precautions / Restrictions Precautions Precautions: Back Precaution Booklet Issued: No Precaution Comments: for comfort Required Braces or Orthoses:  (pt has a brace at home she ordered for comfort) Restrictions Weight Bearing Restrictions: No      Mobility Bed Mobility Overal bed mobility: Modified  Independent             General bed mobility comments: Pt performing log roll technique safely and transitioning to sit R EOB with use of bed rail, mod I. Pt already aware of techniques to maintain spinal precautions.    Transfers Overall transfer level: Needs assistance Equipment used: Rolling walker (2 wheeled) Transfers: Sit to/from [facility] Sit to Stand: Min guard Stand pivot transfers: Min guard       General transfer comment: Min guard to come to stand from EOB 1x pulling up on RA and from toilet 1x using grab bar. Educated pt to push up from current surface to transfer to stand or reach back for target surface prior to sitting for safety. Educated pt verbally on use of breaks when utilizing her rollator at home.    Balance Overall balance assessment: Needs assistance Sitting-balance support: No upper extremity supported;Feet supported Sitting balance-Leahy Scale: Good Sitting balance - Comments: Able to bring ankle up to contralateral leg to manage socks while reaching off BOS.   Standing balance support: No upper extremity supported;Bilateral upper extremity supported;During functional activity Standing balance-Leahy Scale: Fair Standing balance comment: Able to perform tasks reaching min off BOS at sink without UE support but relies on bil UE support on RW for mobility.                           ADL either performed or assessed with clinical judgement   ADL Overall ADL's : Needs assistance/impaired Eating/Feeding: Independent   Grooming: Wash/dry hands;Wash/dry face;Oral care;Brushing hair;Min guard;Standing   Upper Body  Bathing: Set up;Supervision/ safety;Sitting   Lower Body Bathing: Min guard;Sit to/from stand   Upper Body Dressing : Set up;Sitting   Lower Body Dressing: Min guard;Sit to/from stand Lower Body Dressing Details (indicate cue type and reason): able to perform figure 4 Toilet Transfer: Min guard;Ambulation;Comfort  height toilet;RW   Toileting- Water quality scientist and Hygiene: Min guard;Sit to/from stand       Functional mobility during ADLs: Min guard;Rolling walker General ADL Comments: pt requires min guard assist due to back pain.  She moves slowly and is guarded due to pain     Vision Baseline Vision/History: Wears glasses Wears Glasses: At all times Patient Visual Report: No change from baseline Vision Assessment?: No apparent visual deficits     Perception Perception Perception Tested?: Yes   Praxis Praxis Praxis tested?: Within functional limits    Pertinent Vitals/Pain Pain Assessment: 0-10 Pain Score: 7  Pain Location: back of head, back, R anterior thigh, L hip Pain Descriptors / Indicators: Aching;Dull;Headache Pain Intervention(s): Premedicated before session;Repositioned;Monitored during session     Hand Dominance Left   Extremity/Trunk Assessment Upper Extremity Assessment Upper Extremity Assessment: Overall WFL for tasks assessed   Lower Extremity Assessment Lower Extremity Assessment: Defer to PT evaluation RLE Deficits / Details: R leg pain and thus decreased strength grossly compared to L RLE Sensation:  (denies numbness/tingling) RLE Coordination: decreased gross motor   Cervical / Trunk Assessment Cervical / Trunk Assessment: Other exceptions Cervical / Trunk Exceptions: numerous spine lesions   Communication Communication Communication: No difficulties   Cognition Arousal/Alertness: Awake/alert Behavior During Therapy: WFL for tasks assessed/performed Overall Cognitive Status: Within Functional Limits for tasks assessed                                 General Comments: A&Ox4.   General Comments  pt instructed in back precautions.  Discussed recommendations that she consider switching to first floor apartment, and to have direct supervision with medication and financial management initially to ensure her safety    Exercises      Shoulder Instructions      Home Living Family/patient expects to be discharged to:: Private residence Living Arrangements: Alone Available Help at Discharge: Family;Available 24 hours/day Type of Home: Apartment (2nd floor) Home Access: Stairs to enter Entrance Stairs-Number of Steps: flight Entrance Stairs-Rails: Right (ascending) Home Layout: One level     Bathroom Shower/Tub: Tub/shower unit;Curtain   Biochemist, clinical: Standard     Home Equipment: Clinical cytogeneticist - 4 wheels;Cane - single point;Hand held shower head          Prior Functioning/Environment Level of Independence: Independent        Comments: Pt likes to do oil painting. Pt worked as a Radio broadcast assistant. Pt drives.        OT Problem List: Decreased strength;Decreased activity tolerance;Impaired balance (sitting and/or standing);Pain      OT Treatment/Interventions: Self-care/ADL training;DME and/or AE instruction;Therapeutic activities;Patient/family education;Cognitive remediation/compensation;Balance training    OT Goals(Current goals can be found in the care plan section) Acute Rehab OT Goals Patient Stated Goal: to get back to normal OT Goal Formulation: With patient Time For Goal Achievement: 12/21/20 Potential to Achieve Goals: Good ADL Goals Pt Will Perform Grooming: with modified independence;standing Pt Will Perform Upper Body Bathing: with modified independence;sitting Pt Will Perform Lower Body Bathing: with modified independence;sit to/from stand Pt Will Perform Upper Body Dressing: with modified independence;sitting Pt Will Perform Lower Body Dressing:  with modified independence;sit to/from stand Pt Will Transfer to Toilet: with modified independence;ambulating;regular height toilet;bedside commode;grab bars Pt Will Perform Toileting - Clothing Manipulation and hygiene: with modified independence;sit to/from stand Pt Will Perform Tub/Shower Transfer: Tub transfer;ambulating;tub  bench;rolling walker  OT Frequency: Min 2X/week   Barriers to D/C:            Co-evaluation PT/OT/SLP Co-Evaluation/Treatment: Yes Reason for Co-Treatment: For patient/therapist safety;To address functional/ADL transfers PT goals addressed during session: Mobility/safety with mobility;Balance;Proper use of DME OT goals addressed during session: ADL's and self-care      AM-PAC OT "6 Clicks" Daily Activity     Outcome Measure Help from another person eating meals?: None Help from another person taking care of personal grooming?: A Little Help from another person toileting, which includes using toliet, bedpan, or urinal?: A Little Help from another person bathing (including washing, rinsing, drying)?: A Little Help from another person to put on and taking off regular upper body clothing?: A Little Help from another person to put on and taking off regular lower body clothing?: A Little 6 Click Score: 19   End of Session Equipment Utilized During Treatment: Gait belt;Rolling walker Nurse Communication: Mobility status  Activity Tolerance: Patient limited by pain Patient left: in bed;with call bell/phone within reach;with bed alarm set  OT Visit Diagnosis: Pain;Unsteadiness on feet (R26.81) Pain - part of body:  (back)                Time: 2241-1464 OT Time Calculation (min): 30 min Charges:  OT General Charges $OT Visit: 1 Visit OT Evaluation $OT Eval Moderate Complexity: 1 Mod  Nilsa Nutting., OTR/L Acute Rehabilitation Services Pager [phone] Office [phone]   Lucille Passy M 12/07/2020, 1:35 PM

## 2020-12-07 NOTE — Progress Notes (Signed)
Subjective: Patient reports quite sore last night, requiring IV dilaudid for pain  Objective: Vital signs in last 24 hours: Temp:  [97 F (36.1 C)-98.9 F (37.2 C)] 98.9 F (37.2 C) (05/28 0400) Pulse Rate:  [55-74] 70 (05/28 0700) Resp:  [10-28] 13 (05/28 0700) BP: (104-154)/(68-92) 104/68 (05/28 0700) SpO2:  [89 %-99 %] 91 % (05/28 0700) Arterial Line BP: (123-146)/(64-78) 146/78 (05/27 1423)  Intake/Output from previous day: 05/27 0701 - 05/28 0700 In: 1750 [P.O.:200; I.V.:1000; IV Piggyback:300] Out: 2605 [Urine:2455; Blood:150] Intake/Output this shift: No intake/output data recorded.  Physical Exam: Awake, alert, conversant.  Speech clear and fluent.  MAEW without drift.  Incision C/D/I.  Lab Results: Recent Labs    12/05/20 0515 12/06/20 0254  WBC 13.0* 14.1*  HGB 12.1 12.3  HCT 36.9 37.1  PLT 284 323   BMET Recent Labs    12/05/20 0515 12/06/20 0254  NA 140 138  K 3.7 3.8  CL 101 102  CO2 28 29  GLUCOSE 86 95  BUN 16 17  CREATININE 0.58 0.78  CALCIUM 9.6 9.6    Studies/Results: No results found.  Assessment/Plan: Patient is doing well.  Will keep in hospital today and transition to po pain medications.  Mobilize, postop MRI today.  Anticipate discharge in AM.    LOS: 7 days    Peggyann Shoals, MD 12/07/2020, 8:22 AM

## 2020-12-08 MED ORDER — OXYCODONE HCL 5 MG PO TABS
10.0000 mg | ORAL_TABLET | ORAL | Status: DC | PRN
  Administered 2020-12-09 (×2): 10 mg via ORAL
  Filled 2020-12-08 (×2): qty 2

## 2020-12-08 MED ORDER — OXYCODONE HCL 5 MG PO TABS
15.0000 mg | ORAL_TABLET | ORAL | Status: DC | PRN
  Administered 2020-12-08 – 2020-12-09 (×4): 15 mg via ORAL
  Filled 2020-12-08 (×4): qty 3

## 2020-12-08 NOTE — Progress Notes (Signed)
Neurosurgery Service Progress Note  Subjective: No acute events overnight, no new complaints, headaches improving, as headaches improve back pain is starting to become more bothersome, vomiting all day yesterday but improved today  Objective: Vitals:   12/05/20 0557 12/08/20 0128 12/08/20 0815 12/08/20 1128  BP:  122/77 104/79 117/76  Pulse:  82 78 71  Resp:  18 15 16   Temp:  100.3 F (37.9 C) 99.5 F (37.5 C) 98.5 F (36.9 C)  TempSrc:  Oral Oral Oral  SpO2:  93% 97% 96%  Weight: 81.4 kg     Height:        Physical Exam: AOx3, PERRL, EOMI, FS, TM, Strength 5/5 x4, SILTx4, no drift  Assessment & Plan: 50 y.o. LEFT HANDED woman w/ recurrent breast Ca with brain metastases s/p preop SRS, now presents for awake craniotomy and resection of R posterior temporal tumor. 5/28 MRI GTR   -will watch overnight to make sure the vomiting doesn't recur -PT/OT rec'd outpt PT -SCDs/TEDs/SQH  Judith Part  12/08/20 12:48 PM

## 2020-12-09 MED ORDER — MORPHINE SULFATE ER 15 MG PO TBCR
30.0000 mg | EXTENDED_RELEASE_TABLET | Freq: Two times a day (BID) | ORAL | Status: DC
  Administered 2020-12-09 – 2020-12-10 (×2): 30 mg via ORAL
  Filled 2020-12-09 (×2): qty 2

## 2020-12-09 NOTE — Progress Notes (Signed)
Neurosurgery Service Progress Note  Subjective: No acute events overnight, nausea improved, pain still significant  Objective: Vitals:   12/09/20 0741 12/09/20 1054 12/09/20 1516 12/09/20 1949  BP: 116/78 119/81 124/75 128/80  Pulse:  (!) 58 70 78  Resp: 16 14 18    Temp: 98.6 F (37 C) 98.7 F (37.1 C) 98.1 F (36.7 C) 98.2 F (36.8 C)  TempSrc: Oral Oral  Oral  SpO2:  98% 99% 98%  Weight:      Height:        Physical Exam: AOx3, PERRL, EOMI, FS, TM, Strength 5/5 x4, SILTx4, no drift  Assessment & Plan: 50 y.o. LEFT HANDED woman w/ recurrent breast Ca with brain metastases s/p preop SRS, now presents for awake craniotomy and resection of R posterior temporal tumor. 5/28 MRI GTR   -LBP still quite severe, starting RT tomoorrow, will arrange for hospital bed at home (orders signed in discharge med rec) -having some hallucinations with oxycodone (seeing ghosts / apparitions, knows they're not real), will change to MS contin with hydromorphone breakthrough -PT/OT rec'd outpt PT -SCDs/TEDs/SQH  Judith Part  12/09/20 9:52 PM

## 2020-12-09 NOTE — Evaluation (Signed)
Physical Therapy Treatment Patient Details Name: Monica [name] MRN: [id number] DOB: 28-May-1971 Today's Date: 12/09/2020   History of Present Illness  Pt is a 50 y.o. female who presented 5/21 with worsening R-sided back and flank pain. Pt found to have diffuse bony metastatic disease and L4 pathologic fx with hx of triple positive invasive ductal carcinoma of the right breast. imaging on admission noted extensive bony metastasis, large multiple liver masses, multiple pulmonary lung nodules and widespread lytic bony lesions, MRI brain noted at least 4 brain lesions, largest being 3.8 cm, MRI total spine noted numerous spine lesions without epidural enhancement or cord compression. 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 5/23. S/p R-sided awake crani for tumor resection 5/27. PMH breast CA    Clinical Impression  The pt was able to demo great continued progress with mobility and endurance at this time. She was able to complete transfers in the room and short ambulation without assistance or need for UE support to balance. The pt was then educated on use of rollator for mobility, and was able to complete good distance of hallway ambulation without LOB or need for assist. The pt was educated in progressive home walking program with focus on increasing endurance and intensity. The pt demoed improved tolerance for stair navigation at this time, and will be safe and able to complete the flight of stairs needed to return home. The pt verbalized good understanding of all education, and had no further questions at this time. Is safe to return home, but will continue to benefit from skilled PT acutely and following d/c to maximize functional independence, endurance, and strength.     Follow Up Recommendations Outpatient PT;Supervision for mobility/OOB    Equipment Recommendations  None recommended by PT (pt going to obtain rollator)    Recommendations for Other Services       Precautions /  Restrictions Precautions Precautions: Back Precaution Booklet Issued: No Precaution Comments: for comfort Restrictions Weight Bearing Restrictions: No      Mobility  Bed Mobility Overal bed mobility: Modified Independent             General bed mobility comments: Pt performing log roll technique safely and transitioning to sit R EOB with use of bed rail, mod I. Pt already aware of techniques to maintain spinal precautions.    Transfers Overall transfer level: Modified independent Equipment used: None;4-wheeled walker Transfers: Sit to/from [facility] Sit to Stand: Modified independent (Device/Increase time) Stand pivot transfers: Modified independent (Device/Increase time)       General transfer comment: pt able to complete with good technique with and without AD on modI level, no instability on stand  Ambulation/Gait Ambulation/Gait assistance: Supervision Gait Distance (Feet): 350 Feet Assistive device: 4-wheeled walker Gait Pattern/deviations: Decreased step length - left;Decreased stance time - right;Decreased weight shift to right;Antalgic;Step-through pattern Gait velocity: 0.4 m/s Gait velocity interpretation: 1.31 - 2.62 ft/sec, indicative of limited community ambulator General Gait Details: pt completing short bout of ambultion in room without UE support, supervision without UE support for safety, short strides with increased lateral movement. The pt was then given 4-wheel walker and was able to complete with improved stability, on overt LOB or instability  Stairs Stairs: Yes Stairs assistance: Min guard Stair Management: One rail Right;Step to pattern;Forwards Number of Stairs: 10 General stair comments: ascends with alternating pattern and reliance on single rail. x1 with each LE leading. then x4 alternating patterns  Wheelchair Mobility    Modified Rankin (Stroke Patients  Only) Modified Rankin (Stroke Patients Only) Pre-Morbid Rankin  Score: No symptoms Modified Rankin: Moderately severe disability     Balance Overall balance assessment: Needs assistance Sitting-balance support: No upper extremity supported;Feet supported Sitting balance-Leahy Scale: Good Sitting balance - Comments: Able to bring ankle up to contralateral leg to manage socks while reaching off BOS.   Standing balance support: No upper extremity supported;Bilateral upper extremity supported;During functional activity Standing balance-Leahy Scale: Fair Standing balance comment: Able to perform tasks reaching min off BOS at sink without UE support but relies on bil UE support on RW for mobility.                             Pertinent Vitals/Pain Pain Assessment: Faces Pain Score: 4  Pain Location: back, R anterior thigh Pain Descriptors / Indicators: Aching;Dull;Headache Pain Intervention(s): Limited activity within patient's tolerance;Monitored during session;Repositioned     Communication      Cognition Arousal/Alertness: Awake/alert Behavior During Therapy: WFL for tasks assessed/performed Overall Cognitive Status: Within Functional Limits for tasks assessed                                        General Comments General comments (skin integrity, edema, etc.): educated on walking program, use of rollator.    Exercises Other Exercises Other Exercises: step ups to x1 stair x20, use of single rail Other Exercises: step down with LLE on stair, controlled lower and push back to standing. x10   Assessment/Plan              PT Goals (Current goals can be found in the Care Plan section)  Acute Rehab PT Goals Patient Stated Goal: to get back to normal PT Goal Formulation: With patient Time For Goal Achievement: 12/21/20 Potential to Achieve Goals: Fair    Frequency Min 3X/week    AM-PAC PT "6 Clicks" Mobility  Outcome Measure Help needed turning from your back to your side while in a flat bed without  using bedrails?: A Little Help needed moving from lying on your back to sitting on the side of a flat bed without using bedrails?: A Little Help needed moving to and from a bed to a chair (including a wheelchair)?: A Little Help needed standing up from a chair using your arms (e.g., wheelchair or bedside chair)?: A Little Help needed to walk in hospital room?: A Little Help needed climbing 3-5 steps with a railing? : A Little 6 Click Score: 18    End of Session Equipment Utilized During Treatment: Gait belt Activity Tolerance: Patient tolerated treatment well Patient left: in bed;with call bell/phone within reach;with bed alarm set Nurse Communication: Mobility status PT Visit Diagnosis: Unsteadiness on feet (R26.81);Other abnormalities of gait and mobility (R26.89);Muscle weakness (generalized) (M62.81);Difficulty in walking, not elsewhere classified (R26.2);Pain Pain - Right/Left: Right Pain - part of body: Hip    Time: 9381-0175 PT Time Calculation (min) (ACUTE ONLY): 32 min   Charges:     PT Treatments $Gait Training: 8-22 mins $Therapeutic Exercise: 8-22 mins        Karma Ganja, PT, DPT   Acute Rehabilitation Department Pager #: [phone]  Otho Bellows 12/09/2020, 9:58 AM

## 2020-12-10 ENCOUNTER — Encounter (HOSPITAL_COMMUNITY): Payer: Self-pay | Admitting: Neurological Surgery

## 2020-12-10 ENCOUNTER — Telehealth: Payer: Self-pay | Admitting: Oncology

## 2020-12-10 ENCOUNTER — Ambulatory Visit
Admit: 2020-12-10 | Discharge: 2020-12-10 | Disposition: A | Discharge status: Home / Self Care | Payer: Self-pay | Attending: Radiation Oncology | Admitting: Radiation Oncology

## 2020-12-10 ENCOUNTER — Ambulatory Visit: Payer: Self-pay

## 2020-12-10 DIAGNOSIS — D496 Neoplasm of unspecified behavior of brain: Secondary | ICD-10-CM

## 2020-12-10 LAB — BPAM RBC
Blood Product Expiration Date: 202206172359
Blood Product Expiration Date: 202206192359
ISSUE DATE / TIME: 202205270955
ISSUE DATE / TIME: 202205270955
Unit Type and Rh: 9500
Unit Type and Rh: 9500

## 2020-12-10 LAB — TYPE AND SCREEN
ABO/RH(D): O NEG
Antibody Screen: NEGATIVE
Unit division: 0
Unit division: 0

## 2020-12-10 MED ORDER — MORPHINE SULFATE ER 30 MG PO TBCR: 30.0000 mg | EXTENDED_RELEASE_TABLET | Freq: Two times a day (BID) | ORAL | 0 refills | Status: DC

## 2020-12-10 MED ORDER — DEXAMETHASONE 4 MG PO TABS: 2.0000 mg | ORAL_TABLET | Freq: Two times a day (BID) | ORAL | 2 refills | Status: DC

## 2020-12-10 MED ORDER — MORPHINE SULFATE 15 MG PO TABS: 7.5000 mg | ORAL_TABLET | ORAL | Status: DC | PRN

## 2020-12-10 MED ORDER — LIDOCAINE 5 % EX PTCH
1.0000 | MEDICATED_PATCH | CUTANEOUS | 0 refills | Status: DC
Start: 2020-12-10 — End: 2021-01-21

## 2020-12-10 NOTE — Care Management (Signed)
Spoke w patient and family at bedside. She states that the $153 a month through [employer] for a bed rental out of pocket would be ok. Referral Orson Eva for [employer] to reach out to son [name] at [phone] to set up. Patient declined outpatient therapy.

## 2020-12-10 NOTE — Discharge Summary (Signed)
Discharge Summary  Date of Admission: 11/30/2020  Date of Discharge: 12/10/20  Attending Physician: Emelda Brothers, MD  Hospital Course: Patient was admitted with worsening back pain, workup showed recurrent breast cancer with widespread metastases on CT CAP and MRI showing spine and brain metastases. She had SRS to the brain lesions and was transferred from [facility] to [facility] for awake craniotomy and tumor resection on 5/22. Her perioperative course was uncomplicated, post-op MRI showed gross total resection of the right temporal mass. She remained at her neurologic baseline. The patient was discharged home on 12/10/20. she will follow up in clinic with me in 2 weeks as well as with oncolog and radiation oncology.  Neurologic exam at discharge:  AOx3, PERRL, EOMI, FS, TM Strength 5/5 x4, SILTx4, no drift  Discharge diagnosis: Recurrent breast cancer with brain metastases  Judith Part, MD 12/10/20 2:22 PM

## 2020-12-10 NOTE — Telephone Encounter (Signed)
Scheduled appts per 5/31 sch msg. Pt aware.  

## 2020-12-10 NOTE — Discharge Instructions (Signed)
Discharge Instructions  No restriction in activities, slowly increase your activity back to normal.   Your incision is closed with absorbable sutures. These will naturally fall off over the next 4-6 weeks. If they become bothersome or cause discomfort, apply some antibiotic ointment like bacitracin or neosporin on the sutures. This will soften them up and usually makes them more comfortable while they dissolve.  Okay to shower on the day of discharge. Be gentle when cleaning your incision. Use regular soap and water. If that is uncomfortable, try using baby shampoo. Do not submerge the wound under water for 2 weeks after surgery.  Follow up with Dr. Emaley Applin in 2 weeks after discharge. If you do not already have a discharge appointment, please call his office at [phone] to schedule a follow up appointment. If you have any concerns or questions, please call the office and let us know. 

## 2020-12-10 NOTE — Progress Notes (Signed)
Monica [name]   DOB:August 10, 1970   IO#:[id number]   EQA#:[id number]  Subjective:  Monica [name] is POD #5 and making a good recovery. She was seeing "[facility]" with oxycodone and has been switched again to a different narcotic--hopefully with XRT to the spine her pain will decrease sufficiently she can come off those agents; she is having good BMs on the current bowel prophylaxis; very forward looking, getting her house ready; her son is already there receiving the equipment; no family in room  Objective: White woman examined in bed Vitals:   12/09/20 2306 12/10/20 0306  BP: 105/72 112/77  Pulse: 61 60  Resp:    Temp: 98 F (36.7 C) 98.4 F (36.9 C)  SpO2: 99% 100%    Body mass index is 28.96 kg/m.  Intake/Output Summary (Last 24 hours) at 12/10/2020 0802 Last data filed at 12/09/2020 0830 Gross per 24 hour  Intake 240 ml  Output --  Net 240 ml      CBG (last 3)  No results for input(s): GLUCAP in the last 72 hours.   Labs:  Lab Results  Component Value Date   WBC 14.1 (H) 12/06/2020   HGB 12.3 12/06/2020   HCT 37.1 12/06/2020   MCV 90.0 12/06/2020   PLT 323 12/06/2020   NEUTROABS 5.9 11/30/2020    _0 @  Urine Studies No results for input(s): UHGB, CRYS in the last 72 hours.  Invalid input(s): UACOL, UAPR, USPG, UPH, UTP, UGL, UKET, UBIL, UNIT, UROB, ULEU, UEPI, UWBC, URBC, UBAC, CAST, UCOM, BILUA  Basic Metabolic Panel: Recent Labs  Lab 12/05/20 0515 12/06/20 0254  NA 140 138  K 3.7 3.8  CL 101 102  CO2 28 29  GLUCOSE 86 95  BUN 16 17  CREATININE 0.58 0.78  CALCIUM 9.6 9.6   GFR Estimated Creatinine Clearance: 91.5 mL/min (by C-G formula based on SCr of 0.78 mg/dL). Liver Function Tests: No results for input(s): AST, ALT, ALKPHOS, BILITOT, PROT, ALBUMIN in the last 168 hours. No results for input(s): LIPASE, AMYLASE in the last 168 hours. No results for input(s): AMMONIA in the last 168 hours. Coagulation profile Recent Labs  Lab  12/06/20 0254  INR 1.0    CBC: Recent Labs  Lab 12/05/20 0515 12/06/20 0254  WBC 13.0* 14.1*  HGB 12.1 12.3  HCT 36.9 37.1  MCV 91.6 90.0  PLT 284 323   Cardiac Enzymes: No results for input(s): CKTOTAL, CKMB, CKMBINDEX, TROPONINI in the last 168 hours. BNP: Invalid input(s): POCBNP CBG: No results for input(s): GLUCAP in the last 168 hours. D-Dimer No results for input(s): DDIMER in the last 72 hours. Hgb A1c No results for input(s): HGBA1C in the last 72 hours. Lipid Profile No results for input(s): CHOL, HDL, LDLCALC, TRIG, CHOLHDL, LDLDIRECT in the last 72 hours. Thyroid function studies No results for input(s): TSH, T4TOTAL, T3FREE, THYROIDAB in the last 72 hours.  Invalid input(s): FREET3 Anemia work up No results for input(s): VITAMINB12, FOLATE, FERRITIN, TIBC, IRON, RETICCTPCT in the last 72 hours. Microbiology Recent Results (from the past 240 hour(s))  Resp Panel by RT-PCR (Flu A&B, Covid) Nasopharyngeal Swab     Status: None   Collection Time: 11/30/20  3:13 PM   Specimen: Nasopharyngeal Swab; Nasopharyngeal(NP) swabs in vial transport medium  Result Value Ref Range Status   SARS Coronavirus 2 by RT PCR NEGATIVE NEGATIVE Final    Comment: (NOTE) SARS-CoV-2 target nucleic acids are NOT DETECTED.  The SARS-CoV-2 RNA is generally detectable in upper respiratory specimens  during the acute phase of infection. The lowest concentration of SARS-CoV-2 viral copies this assay can detect is 138 copies/mL. A negative result does not preclude SARS-Cov-2 infection and should not be used as the sole basis for treatment or other patient management decisions. A negative result may occur with  improper specimen collection/handling, submission of specimen other than nasopharyngeal swab, presence of viral mutation(s) within the areas targeted by this assay, and inadequate number of viral copies(<138 copies/mL). A negative result must be combined with clinical  observations, patient history, and epidemiological information. The expected result is Negative.  Fact Sheet for Patients:  EntrepreneurPulse.com.au  Fact Sheet for Healthcare Providers:  IncredibleEmployment.be  This test is no t yet approved or cleared by the [place] FDA and  has been authorized for detection and/or diagnosis of SARS-CoV-2 by FDA under an Emergency Use Authorization (EUA). This EUA will remain  in effect (meaning this test can be used) for the duration of the COVID-19 declaration under Section 564(b)(1) of the Act, 21 U.S.C.section 360bbb-3(b)(1), unless the authorization is terminated  or revoked sooner.       Influenza A by PCR NEGATIVE NEGATIVE Final   Influenza B by PCR NEGATIVE NEGATIVE Final    Comment: (NOTE) The Xpert Xpress SARS-CoV-2/FLU/RSV plus assay is intended as an aid in the diagnosis of influenza from Nasopharyngeal swab specimens and should not be used as a sole basis for treatment. Nasal washings and aspirates are unacceptable for Xpert Xpress SARS-CoV-2/FLU/RSV testing.  Fact Sheet for Patients: EntrepreneurPulse.com.au  Fact Sheet for Healthcare Providers: IncredibleEmployment.be  This test is not yet approved or cleared by the [place] FDA and has been authorized for detection and/or diagnosis of SARS-CoV-2 by FDA under an Emergency Use Authorization (EUA). This EUA will remain in effect (meaning this test can be used) for the duration of the COVID-19 declaration under Section 564(b)(1) of the Act, 21 U.S.C. section 360bbb-3(b)(1), unless the authorization is terminated or revoked.  Performed at [facility], [place] [address], [place], [place] [address]   MRSA PCR Screening     Status: None   Collection Time: 12/06/20  5:38 AM   Specimen: Nasal Mucosa; Nasopharyngeal  Result Value Ref Range Status   MRSA by PCR NEGATIVE NEGATIVE  Final    Comment:        The GeneXpert MRSA Assay (FDA approved for NASAL specimens only), is one component of a comprehensive MRSA colonization surveillance program. It is not intended to diagnose MRSA infection nor to guide or monitor treatment for MRSA infections. Performed at [facility], [place] [address], [place], [place] [address]       Studies:  No results found.  Assessment: 50 y.o. [place] woman admitted 11/29/2020 with evidence of widespread metastatic disease  (1) genetics testing 02/02/2018 though the CancerNext gene panel offered by [facility] showed no deleterious mutations in  APC, ATM, BARD1, BMPR1A,BRCA1, BRCA2, BRIP1, CDH1, CDK4, CDKN2A, CHEK2, DICER1, HOXB13, MLH1, MRE11A, MSH2, MSH6, MUTYH, NBN, NF1, PALB2, PMS2, POLD1, POLE, PTEN, RAD50, RAD51C, RAD51D, SMAD4, SMARCA4, STK11 and TP53 (sequencing and deletion/duplication); EPCAM and GREM1 (deletion/duplication only).             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               (a) Pertuzumab omitted after cycle 1 due to diarrhea.             (b) Gemcitabine substituted for docetaxel starting with cycle 5 due to peripheral neuropathy  (4) continued trastuzumab to total 6 months (through February 2020).             (a) echocardiogram 01/21/2018 showed an ejection fraction in the 55-60% range             (b) repeat echocardiogram 06/14/2018 showed an ejection fraction in the 60-65%  (5) adjuvant radiation 08/15/2018 - 09/28/2018 Site/dose:The patient initially received  a dose of 50.4 Gy in 28 fractions to the rightbreast using whole-breast tangent fields. This was delivered using a 3-D conformal technique. The patient then received a boost to the seroma. This delivered an additional 10 Gy in 5 fractions using12E, 9Eelectrons with a special teletherapy technique. The total dose was 60.4 Gy.   (6) started tamoxifen March 2020             (a) the patient is status post hysterectomy, wihtout bilateral salpingo-oophorectomy             (b) [place] and estradiol levels June 2020 show she is pre menopausaul  METASTATIC DISEASE: (7) presenting 11/29/2020 with progressive back pain:  (a) non-contrast CT abd/pelvis (renal stone study) 11/29/2020 shows bone metastases  (b) CT chest/abd/pelvis with contrast 11/30/2020 shows multiple large liver masses, multiple pulmonary nodules, and widespread lytic bone lesions  (c) MRI brain shows at least 4 brain lesions, largest 3.8 cm  (d) total spinal MRI 11/30/2020 shows pathologic fractures at T9, T12, possibly L2, as well as numerous other spine lesions, but no epidural enhancement or cord compression  (e) liver biopsy 12/02/2020 shows adenocarcinoma, prognostic panel pending  (f) pre-op repeat MRI 12/01/2020 shows additional brain lesions, at least 8 of which will be targets for SRS  (8) CNS treatment:  (a) [place] 12/05/2020  (b) surgery 12/06/2020: pathology and prognostic panel pending  (9) XRT to spine to be completed 12/23/2020  (10) advanced directives-- plans to name son as HCPOA  (58) systemic treatment: prognostic panel from liver biopsy 05/21 still not reported    Plan:  Adler is recovering well from her craniotomy and looking forward to spinal XRT, which will begin today. She has good support at home and hopefully will be discharged to day as planned.  We know her liver metastasis is estrogen receptor positive; we are still waiting on HER-2 readings and we will want the same panel from the resected brain  specimen. Once we have that information we can make a definitive plan for systemic treatment. I have scheduled her to see me 12/31/2020 to discuss-- by that time we should have all the relevant information and she will be done with her radiation.  Please let me know if I can be of help before then    Chauncey Cruel, MD 12/10/2020  8:02 AM Medical Oncology and Hematology [facility] [place], [place] [address] Tel. [phone]    Fax. [phone]

## 2020-12-10 NOTE — Progress Notes (Signed)
PT Cancellation Note  Patient Details Name: Monica [name] MRN: [id number] DOB: 18-Jan-1971   Cancelled Treatment:    Reason Eval/Treat Not Completed: Other (comment) - pt reports just being up, states she is getting ready to d/c home and has no acute PT needs at this moment. PT to check back tomorrow if pt not d/c.   Stacie Glaze, PT DPT Acute Rehabilitation Services Pager [phone]  Office [phone]    Louis Matte 12/10/2020, 9:21 AM

## 2020-12-10 NOTE — Progress Notes (Signed)
Neurosurgery Service Progress Note  Subjective: No acute events overnight, back pain improved on MS contin  Objective: Vitals:   12/09/20 1949 12/09/20 2306 12/10/20 0306 12/10/20 0806  BP: 128/80 105/72 112/77 123/78  Pulse: 78 61 60 67  Resp:    16  Temp: 98.2 F (36.8 C) 98 F (36.7 C) 98.4 F (36.9 C) 98.2 F (36.8 C)  TempSrc: Oral  Oral   SpO2: 98% 99% 100% 96%  Weight:      Height:        Physical Exam: AOx3, PERRL, EOMI, FS, TM, Strength 5/5 x4, SILTx4, no drift  Assessment & Plan: 50 y.o. LEFT HANDED woman w/ recurrent breast Ca with brain metastases s/p preop SRS, now presents for awake craniotomy and resection of R posterior temporal tumor. 5/28 MRI GTR   -starts spine SBRT today -MS contin with less side effects, better pain control for her back, can discharge her home on this -PT/OT rec'd outpt PT -SCDs/TEDs/SQH -discharge pending hospital bed being delivered at home, once delivered pt can be discharged home   Judith Part  12/10/20 11:20 AM

## 2020-12-10 NOTE — Progress Notes (Signed)
Palliative:  HPI: 50 y.o. female  with past medical history of right breast cancer (follows [facility]), arthritis in back admitted on 11/30/2020 with weakness, back pain, flank pain and found to have metastatic disease to bone, liver, lung, brain.   I met today at [facility] bedside along with her son and daughter-in-law. They are preparing for discharge today. Monica [name] had complicated pain after surgery which is much improved with MS Contin and she is tolerating MS Contin well. I will add MSIR for breakthrough pain. She has plans to begin radiation to back today and then likely for discharge home. I answered questions of her children to the best of my ability. I provided son with letter to provide to his commanding officer to explain his need to remain with his mother to assist with her care and getting her to and from radiation appointments. Monica [name] expresses her pain is well controlled and she is feeling better today. She is looking forward to returning home. Ambulating well with walker without assist.   Exam: Alert, oriented. No distress. Good spirits. Breathing regular, unlabored. Abd soft. Moving all extremities.   Plan: - Follow with oncology for treatment plan.  - Continue MS Contin with MSIR 7.5 mg every 4 hours as needed.   [place], NP Palliative Medicine Team Pager [phone] (Please see [email or web address] for schedule) Team Phone [phone]    Greater than 50%  of this time was spent counseling and coordinating care related to the above assessment and plan

## 2020-12-10 NOTE — Progress Notes (Signed)
Occupational Therapy Treatment Patient Details Name: Monica [name] MRN: [id number] DOB: 09-Jun-1971 Today's Date: 12/10/2020    History of present illness Pt is a 50 y.o. female who presented 5/21 with worsening R-sided back and flank pain. Pt found to have diffuse bony metastatic disease and L4 pathologic fx with hx of triple positive invasive ductal carcinoma of the right breast. imaging on admission noted extensive bony metastasis, large multiple liver masses, multiple pulmonary lung nodules and widespread lytic bony lesions, MRI brain noted at least 4 brain lesions, largest being 3.8 cm, MRI total spine noted numerous spine lesions without epidural enhancement or cord compression. 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 5/23. S/p R-sided awake crani for tumor resection 5/27. PMH breast CA   OT comments  Pt at adequate level to d/ chome with family (A). Pt without further questions at this time. Pt eating fruit in chair with alarm set at end of session.    Follow Up Recommendations  No OT follow up;Supervision - Intermittent    Equipment Recommendations  None recommended by OT    Recommendations for Other Services      Precautions / Restrictions Precautions Precautions: Back Precaution Comments: for comfort       Mobility Bed Mobility Overal bed mobility: Modified Independent                  Transfers Overall transfer level: Modified independent                    Balance Overall balance assessment: Needs assistance         Standing balance support: Single extremity supported;During functional activity Standing balance-Leahy Scale: Fair                             ADL either performed or assessed with clinical judgement   ADL   Eating/Feeding: Supervision/ safety;Modified independent   Grooming: Wash/dry face;Wash/dry hands;Modified independent               Lower Body Dressing: Supervision/safety Lower Body Dressing  Details (indicate cue type and reason): able to slid on hey dude shoes Toilet Transfer: Supervision/safety;RW           Functional mobility during ADLs: Supervision/safety General ADL Comments: educated on tub transfer and transfer through bathroom door. pt will remove door from hinges to give more space. pt has rollator for home so uncertain if it will fit.     Vision       Perception     Praxis      Cognition Arousal/Alertness: Awake/alert Behavior During Therapy: WFL for tasks assessed/performed Overall Cognitive Status: Within Functional Limits for tasks assessed                                          Exercises Other Exercises Other Exercises: gaze stabiliation for pending car ride home with dynamic environment as pt is currently nauseated Other Exercises: educated on use of command hooks for hand held shower head in bathroom for easier access/ tub handle since she can not screw into the rental property 4 inch tiles   Shoulder Instructions       General Comments      Pertinent Vitals/ Pain       Pain Assessment: Faces Faces Pain Scale: Hurts a little bit Pain Location: back  Pain Descriptors / Indicators:  (nauseated) Pain Intervention(s): Monitored during session;Premedicated before session;Repositioned  Home Living                                          Prior Functioning/Environment              Frequency  Min 2X/week        Progress Toward Goals  OT Goals(current goals can now be found in the care plan section)  Progress towards OT goals: Progressing toward goals  Acute Rehab OT Goals Patient Stated Goal: to go home with son today OT Goal Formulation: With patient Time For Goal Achievement: 12/21/20 Potential to Achieve Goals: Good ADL Goals Pt Will Perform Grooming: with modified independence;standing Pt Will Perform Upper Body Bathing: with modified independence;sitting Pt Will Perform Lower Body  Bathing: with modified independence;sit to/from stand Pt Will Perform Upper Body Dressing: with modified independence;sitting Pt Will Perform Lower Body Dressing: with modified independence;sit to/from stand Pt Will Transfer to Toilet: with modified independence;ambulating;regular height toilet;bedside commode;grab bars Pt Will Perform Toileting - Clothing Manipulation and hygiene: with modified independence;sit to/from stand Pt Will Perform Tub/Shower Transfer: Tub transfer;ambulating;tub bench;rolling walker  Plan Discharge plan remains appropriate    Co-evaluation                 AM-PAC OT "6 Clicks" Daily Activity     Outcome Measure   Help from another person eating meals?: None Help from another person taking care of personal grooming?: A Little Help from another person toileting, which includes using toliet, bedpan, or urinal?: A Little Help from another person bathing (including washing, rinsing, drying)?: A Little Help from another person to put on and taking off regular upper body clothing?: A Little Help from another person to put on and taking off regular lower body clothing?: A Little 6 Click Score: 19    End of Session Equipment Utilized During Treatment: Rolling walker  OT Visit Diagnosis: Pain;Unsteadiness on feet (R26.81)   Activity Tolerance Patient limited by pain   Patient Left with call bell/phone within reach;in chair;with chair alarm set   Nurse Communication Mobility status        Time: [phone] OT Time Calculation (min): 39 min  Charges: OT General Charges $OT Visit: 1 Visit OT Treatments $Self Care/Home Management : 23-37 mins   Brynn, OTR/L  Acute Rehabilitation Services Pager: [phone] Office: [phone] .    Jeri Modena 12/10/2020, 3:19 PM

## 2020-12-10 NOTE — Progress Notes (Signed)
Pt discharged and given all paperwork, no written prescriptions to give and all questions were answered. IV removed and patient taken down with all of her belongings.

## 2020-12-11 ENCOUNTER — Ambulatory Visit
Admission: RE | Admit: 2020-12-11 | Discharge: 2020-12-11 | Disposition: A | Discharge status: Home / Self Care | Payer: 59 | Source: Ambulatory Visit | Attending: Radiation Oncology | Admitting: Radiation Oncology

## 2020-12-11 ENCOUNTER — Other Ambulatory Visit: Payer: Self-pay

## 2020-12-11 DIAGNOSIS — C50211 Malignant neoplasm of upper-inner quadrant of right female breast: Secondary | ICD-10-CM | POA: Insufficient documentation

## 2020-12-11 DIAGNOSIS — Z510 Encounter for antineoplastic radiation therapy: Secondary | ICD-10-CM | POA: Insufficient documentation

## 2020-12-11 LAB — SURGICAL PATHOLOGY

## 2020-12-12 ENCOUNTER — Ambulatory Visit
Admission: RE | Admit: 2020-12-12 | Discharge: 2020-12-12 | Disposition: A | Discharge status: Home / Self Care | Payer: 59 | Source: Ambulatory Visit | Attending: Radiation Oncology | Admitting: Radiation Oncology

## 2020-12-12 DIAGNOSIS — Z510 Encounter for antineoplastic radiation therapy: Secondary | ICD-10-CM | POA: Diagnosis not present

## 2020-12-13 ENCOUNTER — Ambulatory Visit
Admission: RE | Admit: 2020-12-13 | Discharge: 2020-12-13 | Disposition: A | Discharge status: Home / Self Care | Payer: 59 | Source: Ambulatory Visit | Attending: Radiation Oncology | Admitting: Radiation Oncology

## 2020-12-13 ENCOUNTER — Other Ambulatory Visit: Payer: Self-pay

## 2020-12-13 DIAGNOSIS — Z510 Encounter for antineoplastic radiation therapy: Secondary | ICD-10-CM | POA: Diagnosis not present

## 2020-12-13 LAB — SURGICAL PATHOLOGY

## 2020-12-16 ENCOUNTER — Ambulatory Visit
Admission: RE | Admit: 2020-12-16 | Discharge: 2020-12-16 | Disposition: A | Discharge status: Home / Self Care | Payer: 59 | Source: Ambulatory Visit | Attending: Radiation Oncology | Admitting: Radiation Oncology

## 2020-12-16 ENCOUNTER — Other Ambulatory Visit: Payer: Self-pay

## 2020-12-16 DIAGNOSIS — Z510 Encounter for antineoplastic radiation therapy: Secondary | ICD-10-CM | POA: Diagnosis not present

## 2020-12-16 NOTE — Progress Notes (Signed)
  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   Narrative: The patient tolerated radiation therapy relatively well.   Plan: The patient will receive a call in about one month from the radiation oncology department. She will continue follow up with Dr. Zada Finders as well. Dr. Jana Hakim will continue to manager her systemic treatment and she will meet with Dr. Mickeal Skinner in surveillance through our brain oncology program.  ______________________________________________    Carola Rhine, PAC

## 2020-12-17 ENCOUNTER — Ambulatory Visit
Admission: RE | Admit: 2020-12-17 | Discharge: 2020-12-17 | Disposition: A | Discharge status: Home / Self Care | Payer: 59 | Source: Ambulatory Visit | Attending: Radiation Oncology | Admitting: Radiation Oncology

## 2020-12-17 DIAGNOSIS — Z510 Encounter for antineoplastic radiation therapy: Secondary | ICD-10-CM | POA: Diagnosis not present

## 2020-12-18 ENCOUNTER — Ambulatory Visit
Admission: RE | Admit: 2020-12-18 | Discharge: 2020-12-18 | Disposition: A | Discharge status: Home / Self Care | Payer: 59 | Source: Ambulatory Visit | Attending: Radiation Oncology | Admitting: Radiation Oncology

## 2020-12-18 ENCOUNTER — Other Ambulatory Visit: Payer: Self-pay

## 2020-12-18 DIAGNOSIS — Z510 Encounter for antineoplastic radiation therapy: Secondary | ICD-10-CM | POA: Diagnosis not present

## 2020-12-18 NOTE — Op Note (Signed)
  Name: [name]  MRN: [id number]  Date: 11/30/2020   DOB: 11/21/1970  Stereotactic Radiosurgery Operative Note  PRE-OPERATIVE DIAGNOSIS:  Multiple Brain Metastases  POST-OPERATIVE DIAGNOSIS:  Multiple Brain Metastases  PROCEDURE:  Stereotactic Radiosurgery  SURGEON:  Judith Part, MD  NARRATIVE: The patient underwent a radiation treatment planning session in the radiation oncology simulation suite under the care of the radiation oncology physician and physicist.  I participated closely in the radiation treatment planning afterwards. The patient underwent planning CT which was fused to 3T high resolution MRI with 1 mm axial slices.  These images were fused on the planning system.  We contoured the gross target volumes and subsequently expanded this to yield the Planning Target Volume. I actively participated in the planning process.  I helped to define and review the target contours and also the contours of the optic pathway, eyes, brainstem and selected nearby organs at risk.  All the dose constraints for critical structures were reviewed and compared to AAPM Task Group 101.  The prescription dose conformity was reviewed.  I approved the plan electronically.    Accordingly, Monica [name] was brought to the TrueBeam stereotactic radiation treatment linac and placed in the custom immobilization mask.  The patient was aligned according to the IR fiducial markers with Brain[facility] Exactrac, then orthogonal x-rays were used in ExacTrac with the 6DOF robotic table and the shifts were made to align the patient.  Monica [name] received stereotactic radiosurgery uneventfully.    Lesions treated:  8   Complex lesions treated:  1 (>3.5 cm, <83mm of optic path, or within the brainstem)   The detailed description of the procedure is recorded in the radiation oncology procedure note.  I was present for the duration of the procedure.  DISPOSITION:  Following delivery, the patient was transported to  nursing in stable condition and monitored for possible acute effects to be discharged to home in stable condition with follow-up in one month.  Judith Part, MD 12/18/2020 6:29 PM

## 2020-12-19 ENCOUNTER — Ambulatory Visit: Payer: 59

## 2020-12-19 ENCOUNTER — Encounter: Payer: Self-pay | Admitting: Oncology

## 2020-12-19 ENCOUNTER — Ambulatory Visit
Admission: RE | Admit: 2020-12-19 | Discharge: 2020-12-19 | Disposition: A | Discharge status: Home / Self Care | Payer: 59 | Source: Ambulatory Visit | Attending: Radiation Oncology | Admitting: Radiation Oncology

## 2020-12-19 ENCOUNTER — Telehealth: Payer: Self-pay | Admitting: *Deleted

## 2020-12-19 DIAGNOSIS — Z510 Encounter for antineoplastic radiation therapy: Secondary | ICD-10-CM | POA: Diagnosis not present

## 2020-12-19 MED ORDER — ONDANSETRON HCL 8 MG PO TABS: 8.0000 mg | ORAL_TABLET | Freq: Three times a day (TID) | ORAL | 0 refills | Status: DC | PRN

## 2020-12-19 NOTE — Telephone Encounter (Signed)
This RN spoke with pt post retrieving her My chart message left late in day stating issues with nausea.  Note she is receiving radiation.  She is on MSO4 as well as decadron.  Per discussion she states nausea started this am - and has been " lingering " all day.  Per review she states she is taking pepcid in the am.  She is able to eat small amounts and drink.  She has not had actual vomiting.  She is having daily bowel movements and is instituting stool softener with laxative daily.  This RN discussed use of zofran - verified pharmacy and prescription sent.  Pt understands to call if symptoms do not improve.

## 2020-12-20 ENCOUNTER — Ambulatory Visit
Admission: RE | Admit: 2020-12-20 | Discharge: 2020-12-20 | Disposition: A | Discharge status: Home / Self Care | Payer: 59 | Source: Ambulatory Visit | Attending: Radiation Oncology | Admitting: Radiation Oncology

## 2020-12-20 DIAGNOSIS — Z510 Encounter for antineoplastic radiation therapy: Secondary | ICD-10-CM | POA: Diagnosis not present

## 2020-12-23 ENCOUNTER — Ambulatory Visit
Admission: RE | Admit: 2020-12-23 | Discharge: 2020-12-23 | Disposition: A | Discharge status: Home / Self Care | Payer: 59 | Source: Ambulatory Visit | Attending: Radiation Oncology | Admitting: Radiation Oncology

## 2020-12-23 ENCOUNTER — Ambulatory Visit: Payer: 59

## 2020-12-23 ENCOUNTER — Encounter: Payer: Self-pay | Admitting: Oncology

## 2020-12-23 ENCOUNTER — Other Ambulatory Visit: Payer: Self-pay

## 2020-12-23 ENCOUNTER — Encounter: Payer: Self-pay | Admitting: Radiation Oncology

## 2020-12-23 DIAGNOSIS — Z510 Encounter for antineoplastic radiation therapy: Secondary | ICD-10-CM | POA: Diagnosis not present

## 2020-12-24 ENCOUNTER — Telehealth: Payer: Self-pay | Admitting: Radiation Therapy

## 2020-12-24 ENCOUNTER — Ambulatory Visit: Payer: 59

## 2020-12-24 NOTE — Telephone Encounter (Signed)
Spoke with Monica [name] about the recommended steroid taper schedule from [employer] PA-C. She was thankful for the call and has my contact information if she has any other questions or concerns.      Starting 6/14 cut back from 2 mg BID to 2 mg once daily  Then on 6/21- 2 mg every other day and stop on 6/30    Mont Dutton R.T.(R)(T) Radiation Special Procedures Navigator

## 2020-12-24 NOTE — Progress Notes (Signed)
                                                                                                                                                             Patient Name: Monica [name] MRN: [id number] DOB: August 28, 1970 Referring Physician: Lurline Del  Date of Service: 12/23/2020 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  Intent: Palliative  Radiation Treatment Dates: 12/05/2020 through 12/23/2020 Site Technique Total Dose (Gy) Dose per Fx (Gy) Completed Fx Beam Energies  Thoracic Spine: Spine_T9-L3 Complex 30/30 3 10/10 15X  Pelvis: Pelvis_sacrum Complex 30/30 3 10/10 15X   Narrative: The patient tolerated treatment well and noticed slight improvement in her pain but not significant reduction just prior to her course ending.   Plan: The patient will receive a call in about one month from the radiation oncology department. She will continue follow up with Dr. Jana Hakim as well as Dr. Zada Finders as she heals from her brain surgery as well. ________________________________________________    Carola Rhine, PAC

## 2020-12-25 ENCOUNTER — Encounter: Payer: Self-pay | Admitting: Oncology

## 2020-12-25 NOTE — Telephone Encounter (Signed)
This LPN contacted pt regarding pain. Pt states she does have Tramadol which was prescribed by [employer]. Advised pt to take as directed for pain between Morphine. Pt verbalized understanding and knows to contact us with any other concerns.

## 2020-12-28 ENCOUNTER — Emergency Department (HOSPITAL_COMMUNITY): Payer: 59

## 2020-12-28 ENCOUNTER — Other Ambulatory Visit: Payer: Self-pay

## 2020-12-28 ENCOUNTER — Emergency Department (HOSPITAL_COMMUNITY)
Admission: EM | Admit: 2020-12-28 | Discharge: 2020-12-28 | Disposition: A | Discharge status: Home / Self Care | Payer: 59 | Attending: Emergency Medicine | Admitting: Emergency Medicine

## 2020-12-28 DIAGNOSIS — R110 Nausea: Secondary | ICD-10-CM | POA: Insufficient documentation

## 2020-12-28 DIAGNOSIS — R0789 Other chest pain: Secondary | ICD-10-CM | POA: Insufficient documentation

## 2020-12-28 DIAGNOSIS — Z853 Personal history of malignant neoplasm of breast: Secondary | ICD-10-CM | POA: Diagnosis not present

## 2020-12-28 LAB — COMPREHENSIVE METABOLIC PANEL
ALT: 59 U/L — ABNORMAL HIGH (ref 0–44)
AST: 67 U/L — ABNORMAL HIGH (ref 15–41)
Albumin: 3.4 g/dL — ABNORMAL LOW (ref 3.5–5.0)
Alkaline Phosphatase: 131 U/L — ABNORMAL HIGH (ref 38–126)
Anion gap: 10 (ref 5–15)
BUN: 13 mg/dL (ref 6–20)
CO2: 26 mmol/L (ref 22–32)
Calcium: 10.6 mg/dL — ABNORMAL HIGH (ref 8.9–10.3)
Chloride: 100 mmol/L (ref 98–111)
Creatinine, Ser: 0.84 mg/dL (ref 0.44–1.00)
GFR, Estimated: >60 mL/min (ref >60)
Glucose, Bld: 113 mg/dL — ABNORMAL HIGH (ref 70–99)
Potassium: 3.7 mmol/L (ref 3.5–5.1)
Sodium: 136 mmol/L (ref 135–145)
Total Bilirubin: 0.5 mg/dL (ref 0.3–1.2)
Total Protein: 6.9 g/dL (ref 6.5–8.1)

## 2020-12-28 LAB — CBC WITH DIFFERENTIAL/PLATELET
Abs Immature Granulocytes: 0.08 10*3/uL — ABNORMAL HIGH (ref 0.00–0.07)
Basophils Absolute: 0 10*3/uL (ref 0.0–0.1)
Basophils Relative: 1 %
Eosinophils Absolute: 0.2 10*3/uL (ref 0.0–0.5)
Eosinophils Relative: 2 %
HCT: 37.7 % (ref 36.0–46.0)
Hemoglobin: 12.2 g/dL (ref 12.0–15.0)
Immature Granulocytes: 1 %
Lymphocytes Relative: 7 %
Lymphs Abs: 0.4 10*3/uL — ABNORMAL LOW (ref 0.7–4.0)
MCH: 29.5 pg (ref 26.0–34.0)
MCHC: 32.4 g/dL (ref 30.0–36.0)
MCV: 91.3 fL (ref 80.0–100.0)
Monocytes Absolute: 0.9 10*3/uL (ref 0.1–1.0)
Monocytes Relative: 14 %
Neutro Abs: 4.6 10*3/uL (ref 1.7–7.7)
Neutrophils Relative %: 75 %
Platelets: 167 10*3/uL (ref 150–400)
RBC: 4.13 MIL/uL (ref 3.87–5.11)
RDW: 12.4 % (ref 11.5–15.5)
WBC: 6.2 10*3/uL (ref 4.0–10.5)
nRBC: 0 % (ref 0.0–0.2)

## 2020-12-28 LAB — TROPONIN I (HIGH SENSITIVITY)
Troponin I (High Sensitivity): 9 ng/L (ref <18)
Troponin I (High Sensitivity): 7 ng/L (ref <18)

## 2020-12-28 LAB — LIPASE, BLOOD: Lipase: 25 U/L (ref 11–51)

## 2020-12-28 IMAGING — DX DG CHEST 1V PORT
1 series · 1 of 1 positions shown · non-contrast
Comparison: Prior chest radiographs [DATE] and earlier. CT
chest [DATE].

CLINICAL DATA: Chest pain. Additional history provided: Patient
reports chest pain in middle of chest. Nausea/vomiting since
yesterday. History of breast cancer.

EXAM:
PORTABLE CHEST 1 VIEW

[chest ap]
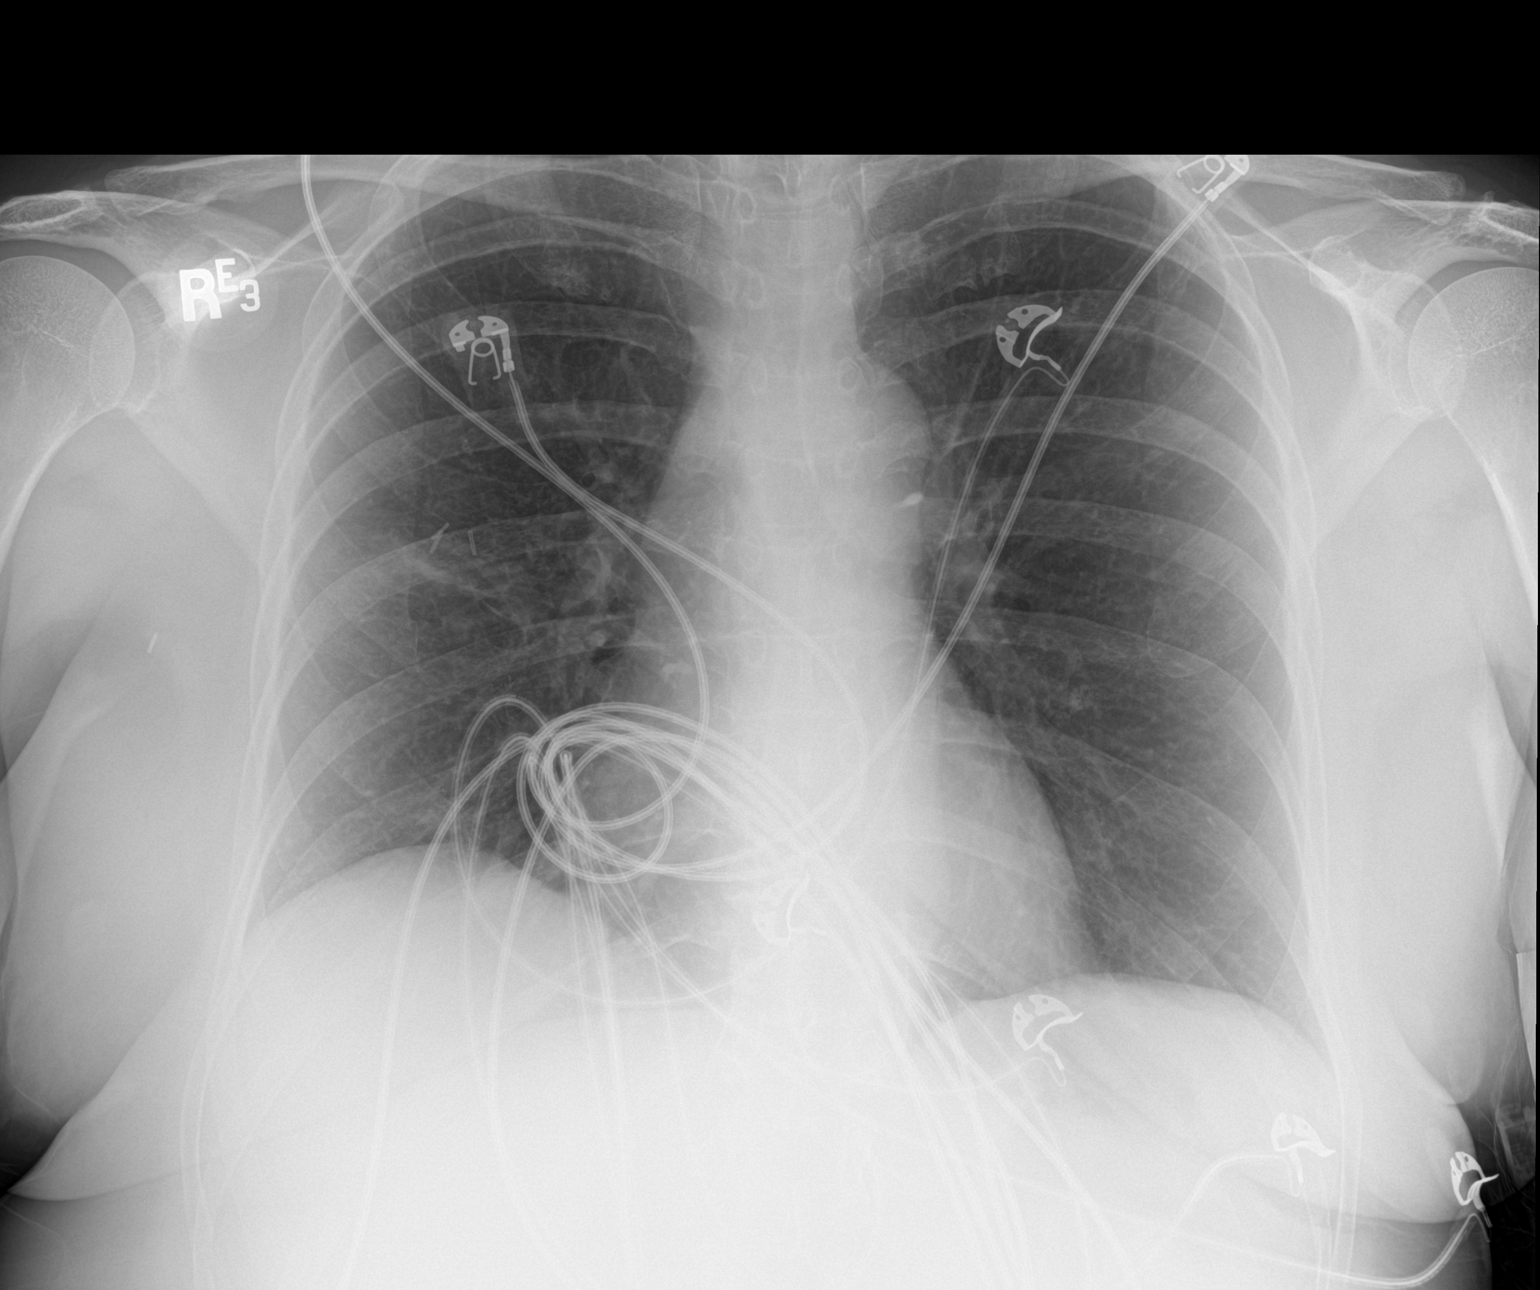

[1 of 1 positions shown; findings below may reference images not displayed]

FINDINGS: Heart size within normal limits. Multiple known bilateral pulmonary
nodules, better appreciated on the prior chest CT of [DATE].
Ill-defined opacity within the right mid lung field, likely
corresponds with a known dominant 2.2 cm right middle lobe nodule.
No appreciable superimposed pneumonia or pulmonary edema. No
evidence of pleural effusion or pneumothorax. Known widespread
osseous metastatic disease. T9, T11, T12 and L2 pathologic
compression fractures were better appreciated on the prior CT.
IMPRESSION: Multiple known bilateral pulmonary nodules, better appreciated on
the prior chest CT of [DATE]. Ill-defined opacity within the
right mid lung, likely corresponds with the known dominant 2.2 cm
right middle lobe nodule.

No appreciable superimposed pneumonia or pulmonary edema.

Known widespread osseous metastatic disease. T9, T11, T12 and L2
pathologic compression fractures were better appreciated on the
prior CT.

## 2020-12-28 MED ORDER — SODIUM CHLORIDE 0.9 % IV BOLUS
1000.0000 mL | Freq: Once | INTRAVENOUS | Status: AC
  Administered 2020-12-28: 1000 mL via INTRAVENOUS

## 2020-12-28 MED ORDER — ONDANSETRON HCL 4 MG/2ML IJ SOLN
4.0000 mg | Freq: Once | INTRAMUSCULAR | Status: AC
  Administered 2020-12-28: 4 mg via INTRAVENOUS
  Filled 2020-12-28: qty 2

## 2020-12-28 MED ORDER — ONDANSETRON 4 MG PO TBDP: 4.0000 mg | ORAL_TABLET | Freq: Three times a day (TID) | ORAL | 0 refills | Status: DC | PRN

## 2020-12-28 MED ORDER — ALUM & MAG HYDROXIDE-SIMETH 200-200-20 MG/5ML PO SUSP
30.0000 mL | Freq: Once | ORAL | Status: AC
  Administered 2020-12-28: 30 mL via ORAL
  Filled 2020-12-28: qty 30

## 2020-12-28 MED ORDER — KETOROLAC TROMETHAMINE 15 MG/ML IJ SOLN
15.0000 mg | Freq: Once | INTRAMUSCULAR | Status: AC
  Administered 2020-12-28: 15 mg via INTRAVENOUS
  Filled 2020-12-28: qty 1

## 2020-12-28 MED ORDER — LIDOCAINE VISCOUS HCL 2 % MT SOLN
15.0000 mL | Freq: Once | OROMUCOSAL | Status: AC
  Administered 2020-12-28: 15 mL via ORAL
  Filled 2020-12-28: qty 15

## 2020-12-28 MED ORDER — OMEPRAZOLE 20 MG PO CPDR: 20.0000 mg | DELAYED_RELEASE_CAPSULE | Freq: Two times a day (BID) | ORAL | 0 refills | Status: DC

## 2020-12-28 MED ORDER — PROMETHAZINE HCL 25 MG RE SUPP: 25.0000 mg | Freq: Four times a day (QID) | RECTAL | 0 refills | Status: DC | PRN

## 2020-12-28 NOTE — ED Provider Notes (Signed)
[facility] DEPT Provider Note   CSN: [id number] Arrival date & time: 12/28/20  6270     History Chief Complaint  Patient presents with   Nausea    Monica [name] is a 50 y.o. female with past medical history significant for anemia, right breast cancer with brain, bone and liver mets.  Oncologist Dr. Jana Hakim.  HPI Patient presents to emergency department today with chief complaint of worsening nausea x3 days.  She has been having intermittent nausea for the last week.  She feels like she cannot tolerate much p.o. intake.  She is only taking sips of water at home.  She had an episode of NBNB emesis this morning. She was able to tolerate eating a banana after vomiting.  She states she was taking morphine at home for pain. She was concerned it was causing her to have heart burn so she recently stopped taking it. She had been taking it daily x 2 weeks. She is reporting chest pain that has been intermittent over the last week as well. She describes it as a burning and tightness sensation in the middle of her chest. Pain does not radiate. She rates pain 4/10 in severity. She denies any fever, chills, shortness of breath, hemoptysis, abdominal pain, urinary symptoms, diarrhea. She denies any sick contacts or known covid exposures. Patient recently finished radiation, has not yet started chemo.   Past Medical History:  Diagnosis Date   Arthritis    lower back   Cancer [facility])    right breast cancer   Family history of breast cancer    Personal history of chemotherapy    Personal history of radiation therapy     Patient Active Problem List   Diagnosis Date Noted   Brain tumor ([place]) 12/06/2020   Palliative care by specialist    Bone metastases [facility])    Metastatic malignant neoplasm ([place])    Brain metastases ([place])    Cancer related pain 11/30/2020   Port-A-Cath in place 03/08/2018   Genetic testing 02/03/2018   Family history of breast cancer 01/12/2018    Malignant neoplasm of upper-inner quadrant of right breast in female, estrogen receptor positive ([place]) 01/05/2018   Vaginal bleeding 02/07/2017   Anemia 02/07/2017   Orthostasis 02/07/2017   S/P laparoscopic hysterectomy 01/20/2017    Past Surgical History:  Procedure Laterality Date   ABDOMINAL HYSTERECTOMY     APPLICATION OF CRANIAL NAVIGATION N/A 12/06/2020   Procedure: APPLICATION OF CRANIAL NAVIGATION;  Surgeon: Judith Part, MD;  Location: [place];  Service: Neurosurgery;  Laterality: N/A;  RM 20   BREAST LUMPECTOMY Right    BREAST LUMPECTOMY WITH RADIOACTIVE SEED AND SENTINEL LYMPH NODE BIOPSY Right 02/16/2018   Procedure: RIGHT BREAST LUMPECTOMY WITH RADIOACTIVE SEED AND SENTINEL LYMPH NODE BIOPSY;  Surgeon: [name], MD;  Location: [place];  Service: General;  Laterality: Right;   CRANIOTOMY Right 12/06/2020   Procedure: Right sided awake craniotomy for tumor resection;  Surgeon: Judith Part, MD;  Location: [place];  Service: Neurosurgery;  Laterality: Right;   HYMENECTOMY     IR US GUIDE BX ASP/DRAIN  12/02/2020   PORTACATH PLACEMENT Right 02/16/2018   Procedure: INSERTION PORT-A-CATH;  Surgeon: [name], MD;  Location: [place];  Service: General;  Laterality: Right;   RE-EXCISION OF BREAST LUMPECTOMY Right 02/22/2018   Procedure: RE-EXCISION OF RIGHT  BREAST LUMPECTOMY;  Surgeon: [name], MD;  Location: [place];  Service: General;  Laterality:  Right;   REPAIR VAGINAL CUFF N/A 02/07/2017   Procedure: REPAIR VAGINAL CUFF;  Surgeon: Ena Dawley, MD;  Location: [facility];  Service: Gynecology;  Laterality: N/A;   ROBOTIC ASSISTED TOTAL HYSTERECTOMY WITH SALPINGECTOMY Left 01/20/2017   Procedure: ROBOTIC ASSISTED TOTAL HYSTERECTOMY WITH SALPINGECTOMY;  Surgeon: [name], MD;  Location: [facility];  Service: Gynecology;  Laterality: Left;     OB History     Gravida  2   Para  2   Term      Preterm       AB      Living         SAB      IAB      Ectopic      Multiple      Live Births              Family History  Problem Relation Age of Onset   Lung cancer Maternal Grandfather    Esophageal cancer Paternal Grandfather 35   Breast cancer Cousin 4    Social History   Tobacco Use   Smoking status: Never   Smokeless tobacco: Never  Vaping Use   Vaping Use: Some days  Substance Use Topics   Alcohol use: Yes    Comment: occ   Drug use: No    Home Medications Prior to Admission medications   Medication Sig Start Date End Date Taking? Authorizing Provider  omeprazole (PRILOSEC) 20 MG capsule Take 1 capsule (20 mg total) by mouth 2 (two) times daily before a meal. 12/28/20 01/27/21 Yes Walisiewicz, Verle Wheeling E, PA-C  ondansetron (ZOFRAN ODT) 4 MG disintegrating tablet Take 1 tablet (4 mg total) by mouth every 8 (eight) hours as needed for nausea or vomiting. 12/28/20  Yes Walisiewicz, Sheralyn Pinegar E, PA-C  promethazine (PHENERGAN) 25 MG suppository Place 1 suppository (25 mg total) rectally every 6 (six) hours as needed for nausea or vomiting. 12/28/20  Yes Walisiewicz, Drusilla [place]ampole E, PA-C  acetaminophen (TYLENOL) 500 MG tablet Take 500 mg by mouth every 6 (six) hours as needed for moderate pain.    [provider]  cholecalciferol (VITAMIN D3) 25 MCG (1000 UNIT) tablet Take 1 tablet (1,000 Units total) by mouth daily. 06/20/20   Magrinat, [name], MD  dexamethasone (DECADRON) 4 MG tablet Take 0.5 tablets (2 mg total) by mouth 2 (two) times daily with breakfast and lunch. 12/11/20   Judith Part, MD  Ferrous Sulfate (IRON) 325 (65 Fe) MG TABS Take 325 mg by mouth daily.    [provider]  ibuprofen (ADVIL) 200 MG tablet Take 800 mg by mouth every 6 (six) hours as needed for mild pain.    [provider]  ibuprofen (ADVIL,MOTRIN) 800 MG tablet Take 1 tablet (800 mg total) by mouth every 8 (eight) hours as needed. Patient not taking: No sig reported  02/16/18   [name], MD  ketorolac (TORADOL) 10 MG tablet Take 10 mg by mouth 2 (two) times daily as needed for moderate pain.    [provider]  lidocaine (LIDODERM) 5 % Place 1 patch onto the skin daily. Remove & Discard patch within 12 hours or as directed by MD 12/10/20   Judith Part, MD  loratadine (CLARITIN) 10 MG tablet Take 10 mg by mouth daily as needed for allergies.    [provider]  Maca Root (MACA PO) Take 1 tablet by mouth daily.    [provider]  morphine (MS CONTIN) 30 MG 12 hr  tablet Take 1 tablet (30 mg total) by mouth every 12 (twelve) hours. 12/10/20   Judith Part, MD  naproxen sodium (ALEVE) 220 MG tablet Take 440 mg by mouth daily as needed (pain).    [provider]  Olopatadine HCl (PATADAY OP) Place 1 drop into both eyes daily.    [provider]  tamoxifen (NOLVADEX) 20 MG tablet Take 1 tablet (20 mg total) by mouth daily. 06/20/20   Magrinat, [name], MD  tamsulosin (FLOMAX) 0.4 MG CAPS capsule Take 0.4 mg by mouth daily. 11/29/20   [provider]  tiZANidine (ZANAFLEX) 4 MG tablet Take 4 mg by mouth 3 (three) times daily as needed for pain. 11/22/20   [provider]  traMADol (ULTRAM) 50 MG tablet Take 50 mg by mouth 2 (two) times daily as needed for severe pain. 11/29/20   [provider]  TURMERIC CURCUMIN PO Take 1 tablet by mouth daily.    [provider]    Allergies    Patient has no known allergies.  Review of Systems   Review of Systems All other systems are reviewed and are negative for acute change except as noted in the HPI.  Physical Exam Updated Vital Signs BP 114/88   Pulse 91   Temp 98.5 F (36.9 C) (Oral)   Resp 16   LMP 12/21/2016 (Exact Date)   SpO2 96%   Physical Exam Vitals and nursing note reviewed.  Constitutional:      General: She is not in acute distress.    Appearance: She is not ill-appearing.  HENT:     Head:  Normocephalic and atraumatic.     Right Ear: Tympanic membrane and external ear normal.     Left Ear: Tympanic membrane and external ear normal.     Nose: Nose normal.     Mouth/Throat:     Mouth: Mucous membranes are moist.     Pharynx: Oropharynx is clear.  Eyes:     General: No scleral icterus.       Right eye: No discharge.        Left eye: No discharge.     Extraocular Movements: Extraocular movements intact.     Conjunctiva/sclera: Conjunctivae normal.     Pupils: Pupils are equal, round, and reactive to light.  Neck:     Vascular: No JVD.  Cardiovascular:     Rate and Rhythm: Normal rate and regular rhythm.     Pulses: Normal pulses.          Radial pulses are 2+ on the right side and 2+ on the left side.     Heart sounds: Normal heart sounds.  Pulmonary:     Comments: Lungs clear to auscultation in all fields. Symmetric chest rise. No wheezing, rales, or rhonchi. Abdominal:     Comments: Abdomen is soft, non-distended, and non-tender in all quadrants. No rigidity, no guarding. No peritoneal signs.  Musculoskeletal:        General: Normal range of motion.     Cervical back: Normal range of motion.  Skin:    General: Skin is warm and dry.     Capillary Refill: Capillary refill takes less than 2 seconds.  Neurological:     Mental Status: She is oriented to person, place, and time.     GCS: GCS eye subscore is 4. GCS verbal subscore is 5. GCS motor subscore is 6.     Comments: Fluent speech, no facial droop.  Psychiatric:  Behavior: Behavior normal.    ED Results / Procedures / Treatments   Labs (all labs ordered are listed, but only abnormal results are displayed) Labs Reviewed  COMPREHENSIVE METABOLIC PANEL - Abnormal; Notable for the following components:      Result Value   Glucose, Bld 113 (*)    Calcium 10.6 (*)    Albumin 3.4 (*)    AST 67 (*)    ALT 59 (*)    Alkaline Phosphatase 131 (*)    All other components within normal limits  CBC WITH  DIFFERENTIAL/PLATELET - Abnormal; Notable for the following components:   Lymphs Abs 0.4 (*)    Abs Immature Granulocytes 0.08 (*)    All other components within normal limits  LIPASE, BLOOD  TROPONIN I (HIGH SENSITIVITY)  TROPONIN I (HIGH SENSITIVITY)    EKG EKG Interpretation  Date/Time:  Saturday December 28 2020 10:54:55 EDT Ventricular Rate:  90 PR Interval:  113 QRS Duration: 73 QT Interval:  335 QTC Calculation: 410 R Axis:   36 Text Interpretation: Sinus rhythm Borderline short PR interval Probable left atrial enlargement Confirmed by Milton Ferguson [phone]) on 12/28/2020 3:02:53 PM  Radiology DG Chest Portable 1 View  Result Date: 12/28/2020 CLINICAL DATA:  Chest pain. Additional history provided: Patient reports chest pain in middle of chest. Nausea/vomiting since yesterday. History of breast cancer. EXAM: PORTABLE CHEST 1 VIEW COMPARISON:  Prior chest radiographs 11/15/2020 and earlier. CT chest 11/30/2020. FINDINGS: Heart size within normal limits. Multiple known bilateral pulmonary nodules, better appreciated on the prior chest CT of 11/30/2020. Ill-defined opacity within the right mid lung field, likely corresponds with a known dominant 2.2 cm right middle lobe nodule. No appreciable superimposed pneumonia or pulmonary edema. No evidence of pleural effusion or pneumothorax. Known widespread osseous metastatic disease. T9, T11, T12 and L2 pathologic compression fractures were better appreciated on the prior CT. IMPRESSION: Multiple known bilateral pulmonary nodules, better appreciated on the prior chest CT of 11/30/2020. Ill-defined opacity within the right mid lung, likely corresponds with the known dominant 2.2 cm right middle lobe nodule. No appreciable superimposed pneumonia or pulmonary edema. Known widespread osseous metastatic disease. T9, T11, T12 and L2 pathologic compression fractures were better appreciated on the prior CT. Electronically Signed   By: Kellie Simmering DO   On:  12/28/2020 11:51    Procedures Procedures   Medications Ordered in ED Medications  sodium chloride 0.9 % bolus 1,000 mL (0 mLs Intravenous Stopped 12/28/20 1505)  ondansetron (ZOFRAN) injection 4 mg (4 mg Intravenous Given 12/28/20 1105)  alum & mag hydroxide-simeth (MAALOX/MYLANTA) 200-200-20 MG/5ML suspension 30 mL (30 mLs Oral Given 12/28/20 1106)    And  lidocaine (XYLOCAINE) 2 % viscous mouth solution 15 mL (15 mLs Oral Given 12/28/20 1106)  ketorolac (TORADOL) 15 MG/ML injection 15 mg (15 mg Intravenous Given 12/28/20 1106)    ED Course  I have reviewed the triage vital signs and the nursing notes.  Pertinent labs & imaging results that were available during my care of the patient were reviewed by me and considered in my medical decision making (see chart for details).    MDM Rules/Calculators/A&P                          History provided by patient with additional history obtained from chart review.    Patient presenting with nausea.  She is afebrile, normotensive.  She was noted to be tachycardic to 120 in triage.  During my exam patient's heart rate is ranging in the 80s to 90s.  She has no abdominal tenderness.  No chest tenderness.  She does look dehydrated, mucous membranes are dry.  Patient given liter of IV fluids as well as GI cocktail and Zofran for nausea. Toradol given for pain. CBC overall unremarkable.  Delta troponin flat. Lipase within normal range. CMP shows no significant electrolyte derangements, mild elevation of LFTs with AST 67 and ALT 59. Chest x-ray without acute findings to suggest bacterial infection.  Patient has known pulmonary nodules. EKG without obvious ischemia.  No STEMI.  Discussed results with patient.  She is resting comfortably.  Pain has improved.  She is tolerating p.o. intake.  Discussed with patient symptom management at home with Phenergan suppositories to use if Zofran is not working.  Also sent refill for ODT Zofran for her to have as she has  run out at home.  Will prescribe Prilosec for her to take for GERD symptoms.  Patient has appointment scheduled with her oncologist in 4 days.  Recommend she keep that for follow-up.  Patient is eager to be discharged home.  Discussed importance of hydration.  Strict return precautions were discussed.    Portions of this note were generated with [employer]obby[employer]. Dictation errors may occur despite best attempts at proofreading.   Final Clinical Impression(s) / ED Diagnoses Final diagnoses:  Nausea    Rx / DC Orders ED Discharge Orders          Ordered    promethazine (PHENERGAN) 25 MG suppository  Every 6 hours PRN        12/28/20 1450    omeprazole (PRILOSEC) 20 MG capsule  2 times daily before meals        12/28/20 1454    ondansetron (ZOFRAN ODT) 4 MG disintegrating tablet  Every 8 hours PRN        12/28/20 1458             Barrie Folk, PA-C 12/28/20 1520    Milton Ferguson, MD 12/30/20 [phone]

## 2020-12-28 NOTE — Discharge Instructions (Signed)
-  Prescription sent to the pharmacy for Phenergan suppository.  This is to help with nausea if Zofran is not working.  Refill also sent for Zofran that will dissolve under your tongue as needed for nausea.  -Prescription sent for Prilosec.  This is to help with GERD.  Take as prescribed.   Follow-up with your oncologist at your upcoming scheduled appointment.  Return to the ER if any symptoms worsen.

## 2020-12-28 NOTE — ED Notes (Signed)
Pt tolerating oral liquid and cheese stick.

## 2020-12-28 NOTE — ED Triage Notes (Signed)
Pt complains of nausea/vomiting. Hx breast cancer met. to brain and bone complains She was taking morphine but has to stop d/t indigestion. She is taking zofran but does not feel like it is helping. She just finished radiation, has not started chemo yet.

## 2020-12-31 ENCOUNTER — Encounter: Payer: Self-pay | Admitting: Oncology

## 2020-12-31 ENCOUNTER — Other Ambulatory Visit: Payer: Self-pay

## 2020-12-31 ENCOUNTER — Inpatient Hospital Stay (HOSPITAL_BASED_OUTPATIENT_CLINIC_OR_DEPARTMENT_OTHER): Payer: 59 | Admitting: Oncology

## 2020-12-31 ENCOUNTER — Inpatient Hospital Stay: Payer: 59 | Attending: Oncology

## 2020-12-31 ENCOUNTER — Telehealth: Payer: Self-pay | Admitting: Pharmacist

## 2020-12-31 ENCOUNTER — Other Ambulatory Visit (HOSPITAL_COMMUNITY): Payer: Self-pay

## 2020-12-31 VITALS — BP 125/73 | HR 97 | Temp 97.7°F | Resp 18 | Ht 66.0 in | Wt 170.0 lb

## 2020-12-31 DIAGNOSIS — C787 Secondary malignant neoplasm of liver and intrahepatic bile duct: Secondary | ICD-10-CM | POA: Insufficient documentation

## 2020-12-31 DIAGNOSIS — Z170 Estrogen receptor positive status [ER+]: Secondary | ICD-10-CM | POA: Diagnosis not present

## 2020-12-31 DIAGNOSIS — C7951 Secondary malignant neoplasm of bone: Secondary | ICD-10-CM

## 2020-12-31 DIAGNOSIS — C7801 Secondary malignant neoplasm of right lung: Secondary | ICD-10-CM

## 2020-12-31 DIAGNOSIS — M25551 Pain in right hip: Secondary | ICD-10-CM | POA: Insufficient documentation

## 2020-12-31 DIAGNOSIS — C50211 Malignant neoplasm of upper-inner quadrant of right female breast: Secondary | ICD-10-CM

## 2020-12-31 DIAGNOSIS — Z90710 Acquired absence of both cervix and uterus: Secondary | ICD-10-CM | POA: Diagnosis not present

## 2020-12-31 DIAGNOSIS — D496 Neoplasm of unspecified behavior of brain: Secondary | ICD-10-CM

## 2020-12-31 DIAGNOSIS — Z800 Family history of malignant neoplasm of digestive organs: Secondary | ICD-10-CM | POA: Insufficient documentation

## 2020-12-31 DIAGNOSIS — R110 Nausea: Secondary | ICD-10-CM | POA: Insufficient documentation

## 2020-12-31 DIAGNOSIS — C7989 Secondary malignant neoplasm of other specified sites: Secondary | ICD-10-CM

## 2020-12-31 DIAGNOSIS — Z801 Family history of malignant neoplasm of trachea, bronchus and lung: Secondary | ICD-10-CM | POA: Insufficient documentation

## 2020-12-31 DIAGNOSIS — C7800 Secondary malignant neoplasm of unspecified lung: Secondary | ICD-10-CM | POA: Diagnosis not present

## 2020-12-31 DIAGNOSIS — R12 Heartburn: Secondary | ICD-10-CM | POA: Insufficient documentation

## 2020-12-31 DIAGNOSIS — Z9079 Acquired absence of other genital organ(s): Secondary | ICD-10-CM | POA: Diagnosis not present

## 2020-12-31 DIAGNOSIS — M545 Low back pain, unspecified: Secondary | ICD-10-CM | POA: Insufficient documentation

## 2020-12-31 DIAGNOSIS — Z803 Family history of malignant neoplasm of breast: Secondary | ICD-10-CM | POA: Diagnosis not present

## 2020-12-31 DIAGNOSIS — C7931 Secondary malignant neoplasm of brain: Secondary | ICD-10-CM | POA: Insufficient documentation

## 2020-12-31 DIAGNOSIS — C7802 Secondary malignant neoplasm of left lung: Secondary | ICD-10-CM

## 2020-12-31 DIAGNOSIS — Z79899 Other long term (current) drug therapy: Secondary | ICD-10-CM | POA: Diagnosis not present

## 2020-12-31 LAB — COMPREHENSIVE METABOLIC PANEL
ALT: 80 U/L — ABNORMAL HIGH (ref 0–44)
AST: 74 U/L — ABNORMAL HIGH (ref 15–41)
Albumin: 3 g/dL — ABNORMAL LOW (ref 3.5–5.0)
Alkaline Phosphatase: 146 U/L — ABNORMAL HIGH (ref 38–126)
Anion gap: 12 (ref 5–15)
BUN: 15 mg/dL (ref 6–20)
CO2: 23 mmol/L (ref 22–32)
Calcium: 9.8 mg/dL (ref 8.9–10.3)
Chloride: 103 mmol/L (ref 98–111)
Creatinine, Ser: 0.78 mg/dL (ref 0.44–1.00)
GFR, Estimated: >60 mL/min (ref >60)
Glucose, Bld: 133 mg/dL — ABNORMAL HIGH (ref 70–99)
Potassium: 3.6 mmol/L (ref 3.5–5.1)
Sodium: 138 mmol/L (ref 135–145)
Total Bilirubin: 0.3 mg/dL (ref 0.3–1.2)
Total Protein: 6.8 g/dL (ref 6.5–8.1)

## 2020-12-31 LAB — CBC WITH DIFFERENTIAL/PLATELET
Abs Immature Granulocytes: 0.15 10*3/uL — ABNORMAL HIGH (ref 0.00–0.07)
Basophils Absolute: 0 10*3/uL (ref 0.0–0.1)
Basophils Relative: 1 %
Eosinophils Absolute: 0.3 10*3/uL (ref 0.0–0.5)
Eosinophils Relative: 7 %
HCT: 35 % — ABNORMAL LOW (ref 36.0–46.0)
Hemoglobin: 11.6 g/dL — ABNORMAL LOW (ref 12.0–15.0)
Immature Granulocytes: 3 %
Lymphocytes Relative: 12 %
Lymphs Abs: 0.6 10*3/uL — ABNORMAL LOW (ref 0.7–4.0)
MCH: 29.9 pg (ref 26.0–34.0)
MCHC: 33.1 g/dL (ref 30.0–36.0)
MCV: 90.2 fL (ref 80.0–100.0)
Monocytes Absolute: 0.6 10*3/uL (ref 0.1–1.0)
Monocytes Relative: 13 %
Neutro Abs: 3.1 10*3/uL (ref 1.7–7.7)
Neutrophils Relative %: 64 %
Platelets: 130 10*3/uL — ABNORMAL LOW (ref 150–400)
RBC: 3.88 MIL/uL (ref 3.87–5.11)
RDW: 12.5 % (ref 11.5–15.5)
WBC: 4.8 10*3/uL (ref 4.0–10.5)
nRBC: 0 % (ref 0.0–0.2)

## 2020-12-31 LAB — VITAMIN D 25 HYDROXY (VIT D DEFICIENCY, FRACTURES): Vit D, 25-Hydroxy: 33.85 ng/mL (ref 30–100)

## 2020-12-31 MED ORDER — TUCATINIB 150 MG PO TABS: 150.0000 mg | ORAL_TABLET | Freq: Two times a day (BID) | ORAL | 6 refills | Status: DC

## 2020-12-31 MED ORDER — TUCATINIB 150 MG PO TABS
150.0000 mg | ORAL_TABLET | Freq: Two times a day (BID) | ORAL | 6 refills | Status: DC
  Filled 2020-12-31: qty 120, 60d supply, fill #0

## 2020-12-31 MED ORDER — DENOSUMAB 120 MG/1.7ML ~~LOC~~ SOLN: 120.0000 mg | Freq: Once | SUBCUTANEOUS | Status: DC

## 2020-12-31 MED ORDER — ONDANSETRON 4 MG PO TBDP: 4.0000 mg | ORAL_TABLET | Freq: Three times a day (TID) | ORAL | 3 refills | Status: DC | PRN

## 2020-12-31 MED ORDER — FLUCONAZOLE 100 MG PO TABS: 100.0000 mg | ORAL_TABLET | Freq: Every day | ORAL | 0 refills | Status: DC

## 2020-12-31 MED ORDER — CAPECITABINE 500 MG PO TABS
1500.0000 mg | ORAL_TABLET | Freq: Two times a day (BID) | ORAL | 6 refills | Status: DC
  Filled 2020-12-31: qty 84, 14d supply, fill #0

## 2020-12-31 MED ORDER — PROCHLORPERAZINE MALEATE 10 MG PO TABS: 10.0000 mg | ORAL_TABLET | Freq: Four times a day (QID) | ORAL | 1 refills | Status: DC | PRN

## 2020-12-31 MED ORDER — CAPECITABINE 500 MG PO TABS: 1500.0000 mg | ORAL_TABLET | Freq: Two times a day (BID) | ORAL | 6 refills | Status: DC

## 2020-12-31 MED ORDER — OMEPRAZOLE 40 MG PO CPDR: DELAYED_RELEASE_CAPSULE | ORAL | 3 refills | Status: DC

## 2020-12-31 NOTE — Progress Notes (Signed)
DISCONTINUE ON PATHWAY REGIMEN - Breast     A cycle is every 21 days:     Docetaxel      Carboplatin      Trastuzumab-xxxx      Trastuzumab-xxxx      Pertuzumab      Pertuzumab   **Always confirm dose/schedule in your pharmacy ordering system**  REASON: Disease Progression PRIOR TREATMENT: [id number]: Docetaxel + Carboplatin + Trastuzumab + Pertuzumab (TCHP) q21 Days x 6 Cycles, Followed by Trastuzumab + Pertuzumab q21 Days x 11 Cycles TREATMENT RESPONSE: Progressive Disease (PD)  START OFF PATHWAY REGIMEN - Breast   [id number]:Capecitabine 1,000 mg/m2 PO BID D1-14 + Trastuzumab 8/6 mg/kg IV D1 + Tucatinib 300 mg PO BID D1-21 q21 Days:   Cycle 1: A cycle is 21 days:     Capecitabine      Tucatinib      Trastuzumab-xxxx    Cycles 2 and beyond: A cycle is every 21 days:     Capecitabine      Tucatinib      Trastuzumab-xxxx   **Always confirm dose/schedule in your pharmacy ordering system**  Patient Characteristics: Distant Metastases or Locoregional Recurrent Disease - Unresected or Locally Advanced Unresectable Disease Progressing after Neoadjuvant and Local Therapies, HER2 Positive, ER Positive, Chemotherapy + HER2-Targeted Therapy, First Line Therapeutic Status: Distant Metastases ER Status: Positive (+) HER2 Status: Positive (+) PR Status: Positive (+) Line of Therapy: First Line Intent of Therapy: Non-Curative / Palliative Intent, Discussed with Patient 

## 2020-12-31 NOTE — Progress Notes (Signed)
[place]  [phone] [phone]     ID: [name] DOB: [birth year]  MR#: [id number]  XIP#:[id number]  Patient Care Team: [name], PA-C as PCP - General (Family Medicine) [name], MD as Consulting Physician (General Surgery) Elster Corbello, [name], MD as Consulting Physician (Oncology) [name], MD as Consulting Physician (Radiation Oncology) [name], MD as Consulting Physician (Obstetrics and Gynecology) Larey Dresser, MD as Consulting Physician (Cardiology) OTHER MD: [facility]   CHIEF COMPLAINT: triple positive breast cancer  CURRENT TREATMENT: To start tucatinib, capecitabine, trastuzumab; denosumab Xgeva  INTERVAL HISTORY: Shakeisha is here for follow up of her triple positive breast cancer.  She is accompanied by her mother and daughter.  Her son [name] had to return to [place].  She presented to the emergency room in May with a variety of complaints.  She was found to have widespread recurrent disease involving liver bones and brain.  This was complicated by problems with nausea and uncontrolled pain.  For the brain as she received SRS and a craniotomy and for the spine she received radiation and those treatments are summarized below.  More recently on 12/28/2020 she presented to the emergency room with worsening pain and nausea.  She was prescribed Phenergan suppositories and ODT Zofran, as well as Prilosec and discharged to home.  She is here today to discuss systemic treatment of her now metastatic breast cancer.   REVIEW OF SYSTEMS: Robbye still has significant pain, mostly localizing to the right hip.  She uses a walker at home but cannot put much pressure on that hip.  The pain in the lower back is a little bit better.  For pain she is currently taking Aleve and Tylenol.  She was not able to tolerate narcotics.  She was having some constipation but currently is having some loose stools.  She  denies headaches or visual changes.  She does keep some nausea and this took her as noted above to the emergency room last weekend.  Her Decadron now is at 2 mg 1 day and nothing the next day and on the off day which is today she feels weaker and more depressed and more nauseated.  She has terrible reflux and she is taking omeprazole 20 mg twice daily for that with not much relief.  She has no cough phlegm production or pleurisy no shortness of breath.  There have been no falls.  A detailed review of systems today was otherwise stable.   COVID 19 VACCINATION STATUS: Status post Moderna x2, no booster as of December 2021   HISTORY OF CURRENT ILLNESS: From the original intake note:  Kwynn Damico (pronounced "uric") palpated a right supraclavicular mass and felt tenderness after stretching one day. She brought this to her PA's attention.  The patient underwent bilateral diagnostic mammography with tomography and right breast ultrasonography at [facility] on 12/20/2017 showing: breast density category C. There was a suspicious palpable mass at the 1 o'clock position upper inner quadrant measuring 1.4 x 1.3 x 1.3 cm located 12 cm from the nipple. There was an additional mass at the 8 o'clock radiant measuring 1.2 x 1.0 x 1.4 cm, which likely represented a cyst. Ultrasound evaluation of the right axilla demonstrates no evidence of lymphadenopathy  Accordingly on 12/29/2017 she proceeded to biopsy of the right breast area in question. The pathology from this procedure showed ([id number]): Invasive ductal carcinoma, grade III. Prognostic indicators significant for: estrogen receptor, 80% positive and progesterone receptor, 70%  positive, both with strong staining intensity. Proliferation marker Ki67 at 80%. HER2 amplified with ratios HER2/CEP17 signals 3.44 and average HER2 copies per cell 10.65.  We are obtaining Bettles and estradiol levels today to assess her menopausal status  The patient's subsequent  history is as detailed below.   PAST MEDICAL HISTORY: Past Medical History:  Diagnosis Date   Arthritis    lower back   Cancer [facility])    right breast cancer   Family history of breast cancer    Personal history of chemotherapy    Personal history of radiation therapy   She notes that she had a heart murmur as a child, which she grew out of.    PAST SURGICAL HISTORY: Past Surgical History:  Procedure Laterality Date   ABDOMINAL HYSTERECTOMY     APPLICATION OF CRANIAL NAVIGATION N/A 12/06/2020   Procedure: APPLICATION OF CRANIAL NAVIGATION;  Surgeon: Judith Part, MD;  Location: [place];  Service: Neurosurgery;  Laterality: N/A;  RM 20   BREAST LUMPECTOMY Right    BREAST LUMPECTOMY WITH RADIOACTIVE SEED AND SENTINEL LYMPH NODE BIOPSY Right 02/16/2018   Procedure: RIGHT BREAST LUMPECTOMY WITH RADIOACTIVE SEED AND SENTINEL LYMPH NODE BIOPSY;  Surgeon: [name], MD;  Location: [place];  Service: General;  Laterality: Right;   CRANIOTOMY Right 12/06/2020   Procedure: Right sided awake craniotomy for tumor resection;  Surgeon: Judith Part, MD;  Location: [place];  Service: Neurosurgery;  Laterality: Right;   HYMENECTOMY     IR US GUIDE BX ASP/DRAIN  12/02/2020   PORTACATH PLACEMENT Right 02/16/2018   Procedure: INSERTION PORT-A-CATH;  Surgeon: [name], MD;  Location: [place];  Service: General;  Laterality: Right;   RE-EXCISION OF BREAST LUMPECTOMY Right 02/22/2018   Procedure: RE-EXCISION OF RIGHT  BREAST LUMPECTOMY;  Surgeon: [name], MD;  Location: [place];  Service: General;  Laterality: Right;   REPAIR VAGINAL CUFF N/A 02/07/2017   Procedure: REPAIR VAGINAL CUFF;  Surgeon: Ena Dawley, MD;  Location: [facility];  Service: Gynecology;  Laterality: N/A;   ROBOTIC ASSISTED TOTAL HYSTERECTOMY WITH SALPINGECTOMY Left 01/20/2017   Procedure: ROBOTIC ASSISTED TOTAL HYSTERECTOMY WITH SALPINGECTOMY;  Surgeon: [name], MD;  Location: [facility];  Service: Gynecology;  Laterality: Left;  Partial Hysterectomy without BSO   FAMILY HISTORY Family History  Problem Relation Age of Onset   Lung cancer Maternal Grandfather    Esophageal cancer Paternal Grandfather 90   Breast cancer Cousin 43   As of July 2019, the patient's father is alive at 99. The patient's mother is also alive at 22. The patient has 1 brother and 5 sisters. There was a maternal grandfather diagnosed with lung cancer at 21. There was a paternal grandfather diagnosed with esophageal cancer at 68. The patient's father had a pre-cancerous esophageal finding at age 37. There was also a maternal 1st cousin diagnosed with metastatic breast cancer at age 24.    GYNECOLOGIC HISTORY:  Patient's last menstrual period was 12/21/2016 (exact date). Menarche: 50 years old Age at first live birth: 50 years old She is GXP2. She is status post partial hysterectomy without BSO in 2018 She never used HRT. She used oral contraception over 21 years ago with no complications.   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 The patient is separated from her husband, [name]. The patient's son [name] is in the [place] [employer] and is stationed in  [place] (for 3 years beginning January 2020). He plans on working in the railroad industry after he returns. The patient's daughter [name], age 88, works at Engineer, maintenance    [place]: Not in place; at the 06/20/2020 visit the patient was given the appropriate documents to complete and notarized at her discretion   HEALTH MAINTENANCE: Social History   Tobacco Use   Smoking status: Never   Smokeless tobacco: Never  Vaping Use   Vaping Use: Some days  Substance Use Topics   Alcohol use: Yes    Comment: occ   Drug use: No     Colonoscopy:   PAP: 2018/ prior to hysterectomy  Bone density:   No Known Allergies  Current  Outpatient Medications  Medication Sig Dispense Refill   fluconazole (DIFLUCAN) 100 MG tablet Take 1 tablet (100 mg total) by mouth daily. 5 tablet 0   omeprazole (PRILOSEC) 40 MG capsule Take one tablet twice a day for 7 days, then daily 60 capsule 3   acetaminophen (TYLENOL) 500 MG tablet Take 500 mg by mouth every 6 (six) hours as needed for moderate pain.     capecitabine (XELODA) 500 MG tablet Take 3 tablets (1,500 mg total) by mouth 2 (two) times daily before a meal. Take for 14 days on, 7 days off. Repeat every 21 days. 84 tablet 6   cholecalciferol (VITAMIN D3) 25 MCG (1000 UNIT) tablet Take 1 tablet (1,000 Units total) by mouth daily.     dexamethasone (DECADRON) 4 MG tablet Take 0.5 tablets (2 mg total) by mouth 2 (two) times daily with breakfast and lunch. 60 tablet 2   lidocaine (LIDODERM) 5 % Place 1 patch onto the skin daily. Remove & Discard patch within 12 hours or as directed by MD 30 patch 0   naproxen sodium (ALEVE) 220 MG tablet Take 440 mg by mouth daily as needed (pain).     Olopatadine HCl (PATADAY OP) Place 1 drop into both eyes daily.     ondansetron (ZOFRAN ODT) 4 MG disintegrating tablet Take 1 tablet (4 mg total) by mouth every 8 (eight) hours as needed for nausea or vomiting. 30 tablet 3   prochlorperazine (COMPAZINE) 10 MG tablet Take 1 tablet (10 mg total) by mouth every 6 (six) hours as needed (Nausea or vomiting). 30 tablet 1   promethazine (PHENERGAN) 25 MG suppository Place 1 suppository (25 mg total) rectally every 6 (six) hours as needed for nausea or vomiting. 12 each 0   tucatinib (TUKYSA) 150 MG tablet Take 1 tablet (150 mg total) by mouth 2 (two) times daily. Take every 12 hrs at the same time each day with or without a meal. 60 tablet 6   TURMERIC CURCUMIN PO Take 1 tablet by mouth daily.     Current Facility-Administered Medications  Medication Dose Route Frequency Provider Last Rate Last Admin   denosumab (XGEVA) injection 120 mg  120 mg Subcutaneous  Once Jamelah Sitzer, [name], MD        OBJECTIVE: White woman examined in a wheelchair  Vitals:   12/31/20 1556  BP: 125/73  Pulse: 97  Resp: 18  Temp: 97.7 F (36.5 C)  SpO2: 99%      Body mass index is 27.44 kg/m.   Wt Readings from Last 3 Encounters:  12/31/20 170 lb (77.1 kg)  12/05/20 179 lb 7.3 oz (81.4 kg)  06/20/20 181 lb 11.2 oz (82.4 kg)  ECOG FS:1  Sclerae unicteric, EOMs intact Minimal film over tongue suggestive of  thrush Lungs no rales or rhonchi Heart regular rate and rhythm Abd soft, nontender, positive bowel sounds MSK no focal spinal tenderness, no upper extremity lymphedema Neuro: nonfocal, well oriented, appropriate affect Breasts: Deferred  LAB RESULTS:  CMP     Component Value Date/Time   NA 138 12/31/2020 1540   K 3.6 12/31/2020 1540   CL 103 12/31/2020 1540   CO2 23 12/31/2020 1540   GLUCOSE 133 (H) 12/31/2020 1540   BUN 15 12/31/2020 1540   CREATININE 0.78 12/31/2020 1540   CREATININE 0.81 06/20/2020 0839   CALCIUM 9.8 12/31/2020 1540   PROT 6.8 12/31/2020 1540   ALBUMIN 3.0 (L) 12/31/2020 1540   AST 74 (H) 12/31/2020 1540   AST 19 06/20/2020 0839   ALT 80 (H) 12/31/2020 1540   ALT 14 06/20/2020 0839   ALKPHOS 146 (H) 12/31/2020 1540   BILITOT 0.3 12/31/2020 1540   BILITOT 0.5 06/20/2020 0839   GFRNONAA >60 12/31/2020 1540   GFRNONAA >60 06/20/2020 0839   GFRAA >60 06/13/2019 1138    Lab Results  Component Value Date   WBC 4.8 12/31/2020   NEUTROABS 3.1 12/31/2020   HGB 11.6 (L) 12/31/2020   HCT 35.0 (L) 12/31/2020   MCV 90.2 12/31/2020   PLT 130 (L) 12/31/2020    No results found for: LABCA2  No components found for: OMBTDH741  No results for input(s): INR in the last 168 hours.  No results found for: LABCA2  No results found for: CAN199  Lab Results  Component Value Date   CAN125 205.0 (H) 11/30/2020    Lab Results  Component Value Date   CAN153 255.0 (H) 11/30/2020    Lab Results  Component Value Date    CA2729 [facility] 11/30/2020    No components found for: HGQUANT  No results found for: CEA1 / No results found for: CEA1   No results found for: AFPTUMOR  No results found for: [place]  No results found for: PSA1  Appointment on 12/31/2020  Component Date Value Ref Range Status   WBC 12/31/2020 4.8  4.0 - 10.5 K/uL Final   RBC 12/31/2020 3.88  3.87 - 5.11 MIL/uL Final   Hemoglobin 12/31/2020 11.6 (A) 12.0 - 15.0 g/dL Final   HCT 12/31/2020 35.0 (A) 36.0 - 46.0 % Final   MCV 12/31/2020 90.2  80.0 - 100.0 fL Final   MCH 12/31/2020 29.9  26.0 - 34.0 pg Final   MCHC 12/31/2020 33.1  30.0 - 36.0 g/dL Final   RDW 12/31/2020 12.5  11.5 - 15.5 % Final   Platelets 12/31/2020 130 (A) 150 - 400 K/uL Final   nRBC 12/31/2020 0.0  0.0 - 0.2 % Final   Neutrophils Relative % 12/31/2020 64  % Final   Neutro Abs 12/31/2020 3.1  1.7 - 7.7 K/uL Final   Lymphocytes Relative 12/31/2020 12  % Final   Lymphs Abs 12/31/2020 0.6 (A) 0.7 - 4.0 K/uL Final   Monocytes Relative 12/31/2020 13  % Final   Monocytes Absolute 12/31/2020 0.6  0.1 - 1.0 K/uL Final   Eosinophils Relative 12/31/2020 7  % Final   Eosinophils Absolute 12/31/2020 0.3  0.0 - 0.5 K/uL Final   Basophils Relative 12/31/2020 1  % Final   Basophils Absolute 12/31/2020 0.0  0.0 - 0.1 K/uL Final   Immature Granulocytes 12/31/2020 3  % Final   Abs Immature Granulocytes 12/31/2020 0.15 (A) 0.00 - 0.07 K/uL Final   Performed at [facility], [address]  [place], [place] [address]   Sodium 12/31/2020 138  135 - 145 mmol/L Final   Potassium 12/31/2020 3.6  3.5 - 5.1 mmol/L Final   Chloride 12/31/2020 103  98 - 111 mmol/L Final   CO2 12/31/2020 23  22 - 32 mmol/L Final   Glucose, Bld 12/31/2020 133 (A) 70 - 99 mg/dL Final   Glucose reference range applies only to samples taken after fasting for at least 8 hours.   BUN 12/31/2020 15  6 - 20 mg/dL Final   Creatinine, Ser 12/31/2020 0.78  0.44 - 1.00 mg/dL Final    Calcium 12/31/2020 9.8  8.9 - 10.3 mg/dL Final   Total Protein 12/31/2020 6.8  6.5 - 8.1 g/dL Final   Albumin 12/31/2020 3.0 (A) 3.5 - 5.0 g/dL Final   AST 12/31/2020 74 (A) 15 - 41 U/L Final   ALT 12/31/2020 80 (A) 0 - 44 U/L Final   Alkaline Phosphatase 12/31/2020 146 (A) 38 - 126 U/L Final   Total Bilirubin 12/31/2020 0.3  0.3 - 1.2 mg/dL Final   GFR, Estimated 12/31/2020 >60  >60 mL/min Final   Comment: (NOTE) Calculated using the CKD-EPI Creatinine Equation (2021)    Anion gap 12/31/2020 12  5 - 15 Final   Performed at [facility], [address] [place], [place] [address]    (this displays the last labs from the last 3 days)  No results found for: TOTALPROTELP, ALBUMINELP, A1GS, A2GS, BETS, BETA2SER, GAMS, MSPIKE, SPEI (this displays SPEP labs)  No results found for: KPAFRELGTCHN, LAMBDASER, KAPLAMBRATIO (kappa/lambda light chains)  No results found for: HGBA, HGBA2QUANT, HGBFQUANT, HGBSQUAN (Hemoglobinopathy evaluation)   No results found for: LDH  No results found for: IRON, TIBC, IRONPCTSAT (Iron and TIBC)  No results found for: FERRITIN  Urinalysis    Component Value Date/Time   COLORURINE YELLOW 11/30/2020 1315   APPEARANCEUR HAZY (A) 11/30/2020 1315   LABSPEC 1.009 11/30/2020 1315   PHURINE 5.0 11/30/2020 [place] 11/30/2020 [place] 11/30/2020 [place] 11/30/2020 [place] 11/30/2020 1315   PROTEINUR NEGATIVE 11/30/2020 1315   NITRITE NEGATIVE 11/30/2020 1315   LEUKOCYTESUR TRACE (A) 11/30/2020 1315     STUDIES: MR BRAIN W WO CONTRAST  Result Date: 12/07/2020 CLINICAL DATA:  Brain/CNS neoplasm status post resection of tumor. EXAM: MRI HEAD WITHOUT AND WITH CONTRAST TECHNIQUE: Multiplanar, multiecho pulse sequences of the brain and surrounding structures were obtained without and with intravenous contrast. CONTRAST:  54m GADAVIST GADOBUTROL 1 MMOL/ML IV SOLN  COMPARISON:  Dec 02, 2020. FINDINGS: Brain: Interval resection of a lesion within the posterior right temporal lobe. Expected postsurgical findings with small volume hemorrhage and linear enhancement about the resection cavity and mild peripheral restricted diffusion, compatible with devitalized tissue. No masslike/nodular enhancement at the resection cavity to suggest residual tumor. No mass occupying acute hemorrhage, hydrocephalus, midline shift, or large extra-axial fluid collection. Additional lesions are similar or less well visualized, including: *Solid enhancing 10 mm lesion in the left cerebellum, similar to prior. *Additional punctate enhancing lesion in the more inferior left cerebellum (series 18, image 13) is better seen today but likely was present on the prior. *Right paramidline cerebellar lesion was better seen on the prior. *Similar 2.4 cm lesion in the right parietal lobe (series 18, image 34 and series 20, image 8). *Left frontal lesion is poorly visualized but likely similar (series 18, image 40). *Left perirolandic lesion is faintly visible (series  18, image 44). *Right inferior basal ganglia lesion is faintly visible (series 18, image 28). *Additional small metastatic lesions were better characterized on the recent MRI from Nov 30, 2020. Please see that study for further characterization. Vascular: Major arterial flow voids are maintained at the skull base. Skull and upper cervical spine: Right temporal craniotomy. Redemonstrated hand sing lesions in bifrontal calvarium and left parietal calvarium, compatible with metastatic disease and not substantially changed. Sinuses/Orbits: Clear sinuses.  Unremarkable orbits. Other: No mastoid effusions. IMPRESSION: 1. Interval resection of the dominant right posterior temporal lobe lesion with expected postsurgical changes and no evidence of residual tumor. 2. Additional lesions are similar or not as well seen on today study, likely due to better  technique/less motion on the prior. There is one additional suspected punctate lesion in the inferior left cerebellum (series 18, image 13) which is better characterized on today's study but likely was present on the prior. 3. Similar calvarial metastases. Electronically Signed   By: Margaretha Sheffield MD   On: 12/07/2020 10:12   MR BRAIN W WO CONTRAST  Result Date: 12/02/2020 CLINICAL DATA:  Metastatic breast cancer EXAM: MRI HEAD WITHOUT AND WITH CONTRAST TECHNIQUE: Multiplanar, multiecho pulse sequences of the brain and surrounding structures were obtained without and with intravenous contrast. CONTRAST:  83m GADAVIST GADOBUTROL 1 MMOL/ML IV SOLN COMPARISON:  11/30/2020 FINDINGS: Brain: Over the short interval, no change in multiple metastatic lesions identified on the prior study. Additional lesions are identified, some of which may be artifactual. Enhancing foci identified on series 1100, images 238, 231, 225, 199, 187, 166, 150, 135. There is no acute infarction or new hemorrhage. No worsening of mass effect. No hydrocephalus. Vascular: Major vessel flow voids at the skull base are preserved. Skull and upper cervical spine: Patchy areas of low T1 marrow signal in the calvarium and visualized upper cervical spine reflecting metastatic disease. Sinuses/Orbits: Paranasal sinuses are aerated. Orbits are unremarkable. Other: Sella is unremarkable.  Mastoid air cells are clear. IMPRESSION: Multiple brain metastases as detailed above and annotated on series 1100. Electronically Signed   By: PMacy MisM.D.   On: 12/02/2020 20:35   IR [place] Bx Asp/Drain  Result Date: 12/02/2020 INDICATION: Liver lesions, suspect metastatic breast cancer EXAM: ULTRASOUND LEFT LIVER METASTASIS 18 GAUGE CORE BIOPSY MEDICATIONS: 1% LIDOCAINE LOCAL ANESTHESIA/SEDATION: Moderate (conscious) sedation was employed during this procedure. A total of Versed 2.0 mg and Fentanyl 100 mcg was administered intravenously. Moderate  Sedation Time: 10 minutes. The patient's level of consciousness and vital signs were monitored continuously by radiology nursing throughout the procedure under my direct supervision. FLUOROSCOPY TIME:  Fluoroscopy Time: None. COMPLICATIONS: None immediate. PROCEDURE: Informed written consent was obtained from the patient after a thorough discussion of the procedural risks, benefits and alternatives. All questions were addressed. Maximal Sterile Barrier Technique was utilized including caps, mask, sterile gowns, sterile gloves, sterile drape, hand hygiene and skin antiseptic. A timeout was performed prior to the initiation of the procedure. Previous imaging reviewed. Preliminary ultrasound performed. A left hepatic lobe hypoechoic solid lesion was localized and marked. Under sterile conditions and local anesthesia, a 17 gauge coaxial guide was advanced to the lesion. Needle position confirmed with ultrasound. Images obtained for documentation. Through the access, 18 gauge core biopsies obtained. These were placed in formalin. Sampling was intact and non fragmented. Needle tract occluded with Gel-Foam. No immediate complication. Postprocedure imaging demonstrates no hemorrhage or hematoma. IMPRESSION: Successful ultrasound left hepatic metastasis 18 gauge core biopsy Electronically Signed  By: Eugenie Filler M.D.   On: 12/02/2020 16:07   DG Chest Portable 1 View  Result Date: 12/28/2020 CLINICAL DATA:  Chest pain. Additional history provided: Patient reports chest pain in middle of chest. Nausea/vomiting since yesterday. History of breast cancer. EXAM: PORTABLE CHEST 1 VIEW COMPARISON:  Prior chest radiographs 11/15/2020 and earlier. CT chest 11/30/2020. FINDINGS: Heart size within normal limits. Multiple known bilateral pulmonary nodules, better appreciated on the prior chest CT of 11/30/2020. Ill-defined opacity within the right mid lung field, likely corresponds with a known dominant 2.2 cm right middle lobe  nodule. No appreciable superimposed pneumonia or pulmonary edema. No evidence of pleural effusion or pneumothorax. Known widespread osseous metastatic disease. T9, T11, T12 and L2 pathologic compression fractures were better appreciated on the prior CT. IMPRESSION: Multiple known bilateral pulmonary nodules, better appreciated on the prior chest CT of 11/30/2020. Ill-defined opacity within the right mid lung, likely corresponds with the known dominant 2.2 cm right middle lobe nodule. No appreciable superimposed pneumonia or pulmonary edema. Known widespread osseous metastatic disease. T9, T11, T12 and L2 pathologic compression fractures were better appreciated on the prior CT. Electronically Signed   By: Kellie Simmering DO   On: 12/28/2020 11:51      ELIGIBLE FOR AVAILABLE RESEARCH PROTOCOL: BCEP  ASSESSMENT: 50 y.o.  [place], [place] woman status post right breast upper inner quadrant biopsy 12/29/2017, for a clinical T1c N0, stage IA invasive ductal carcinoma, triple positive, with an MIB-1 of 80%.  (1) genetics testing 02/02/2018 though the CancerNext gene panel offered by [facility] showed no deleterious mutations in  APC, ATM, BARD1, BMPR1A,BRCA1, BRCA2, BRIP1, CDH1, CDK4, CDKN2A, CHEK2, DICER1, HOXB13, MLH1, MRE11A, MSH2, MSH6, MUTYH, NBN, NF1, PALB2, PMS2, POLD1, POLE, PTEN, RAD50, RAD51C, RAD51D, SMAD4, SMARCA4, STK11 and TP53 (sequencing and deletion/duplication); EPCAM and GREM1 (deletion/duplication only).  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>A) )   (2) right lumpectomy and sentinel lymph node sampling 02/16/2018 showed a pT2 pN0, stage IB invasive ductal carcinoma, grade 3, with a positive inferior margin; a total of 5 lymph nodes were removed  (a) additional surgery 02/27/2018 cleared the margins  (3) started carboplatin, docetaxel, trastuzumab and pertuzumab 03/11/2018, repeated every 21 days x 6, last dose 07/18/2018.    (a) Pertuzumab omitted after cycle  1 due to diarrhea.  (b) Gemcitabine substituted for docetaxel starting with cycle 5 due to peripheral neuropathy  (4) continued trastuzumab to total 6 months (through February 2020).  (a) echocardiogram 01/21/2018 showed an ejection fraction in the 55-60% range  (b) repeat echocardiogram 06/14/2018 showed an ejection fraction in the 60-65%  (5) adjuvant radiation 08/15/2018 - 09/28/2018  Site/dose:   The patient initially received a dose of 50.4 Gy in 28 fractions to the right breast using whole-breast tangent fields. This was delivered using a 3-D conformal technique. The patient then received a boost to the seroma. This delivered an additional 10 Gy in 5 fractions using 12E, 9E electrons with a special teletherapy technique. The total dose was 60.4 Gy.   (6) started tamoxifen March 2020  (a) the patient is status post hysterectomy, wihtout bilateral salpingo-oophorectomy  (b) Acequia and estradiol levels June 2020 show she is pre menopausaul  (c) will recheck 06/2020  METASTATIC DISEASE: May 2022, involving brain, liver, lungs, and bones (7) presenting 11/29/2020 with progressive back pain:             (a) non-contrast CT abd/pelvis (renal stone study) 11/29/2020 shows bone  metastases             (b) CT chest/abd/pelvis with contrast 11/30/2020 shows multiple large liver masses, multiple pulmonary nodules, and widespread lytic bone lesions             (c) MRI brain shows at least 4 brain lesions, largest 3.8 cm             (d) total spinal MRI 11/30/2020 shows pathologic fractures at T9, T12, possibly L2, as well as numerous other spine lesions, but no epidural enhancement or cord compression             (e) 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  Because of the brain involvement she will benefit from tucatinib.  She will need Xgeva/denosumab for the bone involvement.  In terms of the HER2 element we are going to go with trastuzumab to start with and we can consider intensifying this later as needed.  For the chemotherapy portion we will go with capecitabine.  All these orders have been placed and our oral chemotherapy pharmacy specialist will be assisting [place] in obtaining these very expensive medications for the patient.  I have also setting her up for her first trastuzumab dose a week from today.  She has tumor markers that are informative and those were checked today.  We will repeat them every 3weeks.  After 4 weeks she will be completely restaged.  If we do not have evidence of response we will intensify treatment.  I think her steroid taper was a little bit aggressive.  I am asking her to back off and start taking 2 mg Decadron daily until we can get her symptoms under better control.  I suspect she also has significant thrush in her throat and I am giving her 5 days of Diflucan.  I am increasing the omeprazole to 40 mg twice daily for 7 days and then down to daily.  For nausea we are continuing the Zofran ODT and Phenergan suppositories but I wrote her for Compazine so she would have a second oral option.  Finally for pain she is going to  take Aleve and Tylenol together 3 times a day instead of twice a day as she was doing.  Hopefully with these changes her functional status will improve enough that she will be able to tolerate treatment well  I did encourage her to try to drink a minimum of 1 quart a day, goal being 2 quarts daily  She will see me again next week at the start  of treatment.  She requested that I write a compassionate reassignment letter for her son and I will be glad to do that for her.  Total encounter time 45 minutes.Sarajane Jews C. Anndee Connett, MD  12/31/20 5:04 PM Medical Oncology and Hematology [address] [place], [place] [address] Tel. [phone]    Fax. [phone]   I, Wilburn Mylar, am acting as scribe for Dr. [name]. Josphine Laffey.  I, Lurline Del MD, have reviewed the above documentation for accuracy and completeness, and I agree with the above.    *Total Encounter Time as defined by the Centers for Medicare and Medicaid Services includes, in addition to the face-to-face time of a patient visit (documented in the note above) non-face-to-face time: obtaining and reviewing outside history, ordering and reviewing medications, tests or procedures, care coordination (communications with other health care professionals or caregivers) and documentation in the medical record.

## 2021-01-01 ENCOUNTER — Telehealth: Payer: Self-pay | Admitting: Oncology

## 2021-01-01 ENCOUNTER — Other Ambulatory Visit (HOSPITAL_COMMUNITY): Payer: Self-pay

## 2021-01-01 LAB — CANCER ANTIGEN 15-3: CA 15-3: 603 U/mL — ABNORMAL HIGH (ref 0.0–25.0)

## 2021-01-01 LAB — CANCER ANTIGEN 27.29: CA 27.29: 696.9 U/mL — ABNORMAL HIGH (ref 0.0–38.6)

## 2021-01-01 LAB — CA 125: Cancer Antigen (CA) 125: 504 U/mL — ABNORMAL HIGH (ref 0.0–38.1)

## 2021-01-01 LAB — FOLLICLE STIMULATING HORMONE: FSH: 30.4 m[IU]/mL

## 2021-01-01 MED ORDER — CAPECITABINE 500 MG PO TABS: 1500.0000 mg | ORAL_TABLET | Freq: Two times a day (BID) | ORAL | 6 refills | Status: DC

## 2021-01-01 MED ORDER — TUCATINIB 150 MG PO TABS: 150.0000 mg | ORAL_TABLET | Freq: Two times a day (BID) | ORAL | 6 refills | Status: DC

## 2021-01-01 NOTE — Telephone Encounter (Signed)
Scheduled appointment per 06/21 los. Patient is aware. 

## 2021-01-01 NOTE — Telephone Encounter (Addendum)
Oral Oncology Pharmacist Encounter  Received new prescription for Xeloda (capecitabine) and Tukysa (tucatinib) for the treatment of metastatic HR positive, HER-2 positive breast cancer in conjunction with trastuzumab, planned duration until disease progression or unacceptable drug toxicity.  Prescription dose and frequency assessed. CMP and CBC w/ Diff from 12/31/20 assessed, noted elevation in LFTs (AST 74 U/L, ALT 80 U/L) alk phos 146 U/L. Noted MD starting patient on low-dose Tukysa, still recommend watching for LFT changes once therapy is initiated.  Current medication list in Epic reviewed, DDIs with Laury Axon identified: Concomitant use of Tukysa and Tamsulosin not recommended - Tukysa, a strong CYP3A4 inhibitor can increase concentrations of tamsulosin increasing risk of ADEs. Will confirm if patient is still taking tamsulosin and update medication list if not.  Category C DDI between Tukysa with Dexamethasone and tramadol - due to [place] being a strong CYP3A4 inhibitor, it may increase serum concentrations of these agents. No changes in therapy required at this time, but recommend monitoring for increased side effects including, but not limited to lethargy, sedation, and increased corticosteroid side effects Category C DDI between [place] and Morphine - Tukysa may increase serum concentrations of morphine through PgP/ABCB1 inhibition - recommend monitoring for increased side effects of morphine including, but not limited to lethargy, sedation. No change in therapy required.   Current medication list in Epic reviewed, DDIs with Xeloda identified: Category C DDI between Xeloda and Omeprazole - proton-pump inhibitors can decrease efficacy of Xeloda - if patient is able (noted she has just started omeprazole due to issues with indigestion) will discuss with patient alternatives to omeprazole, such as H2RA's like famotidine while on Xeloda.  Evaluated chart and no patient barriers to medication adherence  noted.   Patient agreement for treatment documented in MD note on 12/31/20.  Prescription has been e-scribed to the [facility] for benefits analysis and approval.  Oral Oncology Clinic will continue to follow for insurance authorization, copayment issues, initial counseling and start date.  Leron Croak, PharmD, BCPS Hematology/Oncology Clinical Pharmacist [facility] [phone] 01/01/2021 9:10 AM

## 2021-01-01 NOTE — Telephone Encounter (Addendum)
Oral Oncology Pharmacist Encounter  Notified that Xeloda (capecitabine) is required to be filled through [facility]. Prescription redirected to [facility] for dispensing. Once Tukysa prior Josem Kaufmann is approved will have to redirect to [facility] due to [facility] unable to dispense this medication.   Leron Croak, PharmD, BCPS Hematology/Oncology Clinical Pharmacist [facility] [phone] 01/01/2021 10:11 AM

## 2021-01-02 ENCOUNTER — Other Ambulatory Visit (HOSPITAL_COMMUNITY): Payer: Self-pay

## 2021-01-02 LAB — ESTRADIOL, ULTRA SENS: Estradiol, Sensitive: 57.1 pg/mL

## 2021-01-02 NOTE — Progress Notes (Signed)
..  The following Medication: Monica [name] is approved for drug replacement program by [employer]. The enrollment period is from 01/02/2021 to 01/02/2021.  Reason for Assistance: Self Pay. ID: [id number] First DOS:01/07/2021.

## 2021-01-02 NOTE — Telephone Encounter (Signed)
Oral Chemotherapy Pharmacist Encounter   Spoke with patient today to follow up regarding patient's oral chemotherapy medication: Xeloda (capecitabine) and Tukysa (tucatinib)  Discussed with patient that both of these medications are going to be dispensed from two different specialty pharmacies due to insurance requirements. Ms. Cura understands that Xeloda (capecitabine) will be dispensed from [facility] (tele: [phone]). We also discussed that Laury Axon will be filled through [facility] (tele: [phone]). Ms. Dante was provided with both phone numbers to these specialty pharmacies.  The Oral Chemotherapy clinic will be continuing to follow up on the status of both medications at their respective specialty pharmacies for any copayment issues and to help expedite shipment of medication to patient's home.  Provided patient with both my phone number for the oral chemotherapy clinic ([phone]) as well as our Specialty Pharmacy Patient Advocate, Pablo Ledger, direct phone number as well [phone])  Ms. Esselman expressed appreciation and understanding and knows to call the office with any questions concerns.   Leron Croak, PharmD, BCPS Hematology/Oncology Clinical Pharmacist [facility] [phone] 01/02/2021 9:26 AM

## 2021-01-03 ENCOUNTER — Other Ambulatory Visit: Payer: Self-pay | Admitting: *Deleted

## 2021-01-03 MED ORDER — FLUCONAZOLE 100 MG PO TABS: 100.0000 mg | ORAL_TABLET | Freq: Every day | ORAL | 1 refills | Status: DC

## 2021-01-06 ENCOUNTER — Other Ambulatory Visit: Payer: Self-pay | Admitting: Oncology

## 2021-01-06 ENCOUNTER — Other Ambulatory Visit: Payer: Self-pay | Admitting: Pharmacist

## 2021-01-06 DIAGNOSIS — Z170 Estrogen receptor positive status [ER+]: Secondary | ICD-10-CM

## 2021-01-06 DIAGNOSIS — C50211 Malignant neoplasm of upper-inner quadrant of right female breast: Secondary | ICD-10-CM

## 2021-01-06 DIAGNOSIS — C7951 Secondary malignant neoplasm of bone: Secondary | ICD-10-CM

## 2021-01-06 MED ORDER — TRAMADOL HCL 50 MG PO TABS: 50.0000 mg | ORAL_TABLET | Freq: Four times a day (QID) | ORAL | 0 refills | Status: DC | PRN

## 2021-01-06 NOTE — Progress Notes (Signed)
[place]  [phone] [phone]     ID: Monica [name] DOB: Oct 14, 1970  MR#: [id number]  PPJ#:[id number]  Patient Care Team: [name], PA-C as PCP - General (Family Medicine) [name], MD as Consulting Physician (General Surgery) Briawna Carver, [name], MD as Consulting Physician (Oncology) [name], MD as Consulting Physician (Radiation Oncology) [name], MD as Consulting Physician (Obstetrics and Gynecology) Larey Dresser, MD as Consulting Physician (Cardiology) OTHER MD: [facility]   CHIEF COMPLAINT: triple positive breast cancer  CURRENT TREATMENT: tucatinib, capecitabine, trastuzumab; denosumab [place] See   INTERVAL HISTORY: Monica [name] returns today for follow up of her triple positive breast cancer accompanied by her mother.   She was scheduled to start trastuzumab, tucatinib, and capecitabine today.  However her echo had to be postponed and is now scheduled for 01/14/2021.  Accordingly her first trastuzumab dose has been moved to 01/15/2021.   REVIEW OF SYSTEMS: Monica [name] tells me she is doing much better than just a few days ago.  She started taking the Tylenol and Aleve combination 3 times a day and that helped.  A pharmacist told her that the tramadol and Tylenol were similar and she should not take both.  We corrected that today.  She was also started on tramadol which she is using about 3 times a day.  She has had significant problems with narcotics in the past and was not able to tolerate tramadol while in the hospital, but for some reason at this point she is doing much better on the medication which she feels counts are down, possibly makes her a little bit sleepy, but makes her much more comfortable.  She is having some heartburn still but it is much better on the combination of Diflucan and omeprazole that she is taking.  She has a mild headache on and off and has had mild epistaxis.  Aside from these  issues a detailed review of systems today was stable   COVID 19 VACCINATION STATUS: Status post Moderna x2, no booster as of December 2021   HISTORY OF CURRENT ILLNESS: From the original intake note:  Monica [name] (pronounced "uric") palpated a right supraclavicular mass and felt tenderness after stretching one day. She brought this to her PA's attention.  The patient underwent bilateral diagnostic mammography with tomography and right breast ultrasonography at [facility] on 12/20/2017 showing: breast density category C. There was a suspicious palpable mass at the 1 o'clock position upper inner quadrant measuring 1.4 x 1.3 x 1.3 cm located 12 cm from the nipple. There was an additional mass at the 8 o'clock radiant measuring 1.2 x 1.0 x 1.4 cm, which likely represented a cyst. Ultrasound evaluation of the right axilla demonstrates no evidence of lymphadenopathy  Accordingly on 12/29/2017 she proceeded to biopsy of the right breast area in question. The pathology from this procedure showed ([id number]): Invasive ductal carcinoma, grade III. Prognostic indicators significant for: estrogen receptor, 80% positive and progesterone receptor, 70% positive, both with strong staining intensity. Proliferation marker Ki67 at 80%. HER2 amplified with ratios HER2/CEP17 signals 3.44 and average HER2 copies per cell 10.65.  We are obtaining [place] and estradiol levels today to assess her menopausal status  The patient's subsequent history is as detailed below.   PAST MEDICAL HISTORY: Past Medical History:  Diagnosis Date   Arthritis    lower back   Cancer [facility])    right breast cancer   Family history of breast cancer    Personal history of  chemotherapy    Personal history of radiation therapy   She notes that she had a heart murmur as a child, which she grew out of.    PAST SURGICAL HISTORY: Past Surgical History:  Procedure Laterality Date   ABDOMINAL HYSTERECTOMY     APPLICATION OF CRANIAL  NAVIGATION N/A 12/06/2020   Procedure: APPLICATION OF CRANIAL NAVIGATION;  Surgeon: Judith Part, MD;  Location: [place];  Service: Neurosurgery;  Laterality: N/A;  RM 20   BREAST LUMPECTOMY Right    BREAST LUMPECTOMY WITH RADIOACTIVE SEED AND SENTINEL LYMPH NODE BIOPSY Right 02/16/2018   Procedure: RIGHT BREAST LUMPECTOMY WITH RADIOACTIVE SEED AND SENTINEL LYMPH NODE BIOPSY;  Surgeon: [name], MD;  Location: [facility];  Service: General;  Laterality: Right;   CRANIOTOMY Right 12/06/2020   Procedure: Right sided awake craniotomy for tumor resection;  Surgeon: Judith Part, MD;  Location: [place];  Service: Neurosurgery;  Laterality: Right;   HYMENECTOMY     IR US GUIDE BX ASP/DRAIN  12/02/2020   PORTACATH PLACEMENT Right 02/16/2018   Procedure: INSERTION PORT-A-CATH;  Surgeon: [name], MD;  Location: [place];  Service: General;  Laterality: Right;   RE-EXCISION OF BREAST LUMPECTOMY Right 02/22/2018   Procedure: RE-EXCISION OF RIGHT  BREAST LUMPECTOMY;  Surgeon: [name], MD;  Location: [place];  Service: General;  Laterality: Right;   REPAIR VAGINAL CUFF N/A 02/07/2017   Procedure: REPAIR VAGINAL CUFF;  Surgeon: Ena Dawley, MD;  Location: [facility];  Service: Gynecology;  Laterality: N/A;   ROBOTIC ASSISTED TOTAL HYSTERECTOMY WITH SALPINGECTOMY Left 01/20/2017   Procedure: ROBOTIC ASSISTED TOTAL HYSTERECTOMY WITH SALPINGECTOMY;  Surgeon: [name], MD;  Location: [facility];  Service: Gynecology;  Laterality: Left;  Partial Hysterectomy without BSO   FAMILY HISTORY Family History  Problem Relation Age of Onset   Lung cancer Maternal Grandfather    Esophageal cancer Paternal Grandfather 73   Breast cancer Cousin 1  As of July 2019, the patient's father is alive at 20. The patient's mother is also alive at 65. The patient has 1 brother and 5 sisters. There was a maternal grandfather diagnosed with lung cancer at 2.  There was a paternal grandfather diagnosed with esophageal cancer at 71. The patient's father had a pre-cancerous esophageal finding at age 55. There was also a maternal 1st cousin diagnosed with metastatic breast cancer at age 85.    GYNECOLOGIC HISTORY:  Patient's last menstrual period was 12/21/2016 (exact date). Menarche: 50 years old Age at first live birth: 50 years old She is GXP2. She is status post partial hysterectomy without BSO in 2018 She never used HRT. She used oral contraception over 21 years ago with no complications.   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 The patient is separated from her husband, Monica [name]. The patient's son Monica [name] is in the [employer] and is stationed in [place] (for 3 years beginning January 2020). He plans on working in the railroad industry after he returns. The patient's daughter Monica [name], age 68, works at Engineer, maintenance    [place]: Not in place; at the 06/20/2020 visit the patient was given the appropriate documents to complete and notarized at her discretion   HEALTH MAINTENANCE: Social History   Tobacco Use   Smoking status: Never   Smokeless tobacco: Never  Vaping Use   Vaping Use: Some days  Substance Use Topics   Alcohol use:  Yes    Comment: occ   Drug use: No     Colonoscopy:   PAP: 2018/ prior to hysterectomy  Bone density:   No Known Allergies  Current Outpatient Medications  Medication Sig Dispense Refill   acetaminophen (TYLENOL) 500 MG tablet Take 500 mg by mouth every 6 (six) hours as needed for moderate pain.     capecitabine (XELODA) 500 MG tablet Take 3 tablets (1,500 mg total) by mouth 2 (two) times daily before a meal. Take for 14 days on, 7 days off. Repeat every 21 days. 84 tablet 6   cholecalciferol (VITAMIN D3) 25 MCG (1000 UNIT) tablet Take 1 tablet (1,000 Units total) by mouth daily.     dexamethasone (DECADRON) 4 MG  tablet Take 0.5 tablets (2 mg total) by mouth 2 (two) times daily with breakfast and lunch. 60 tablet 2   fluconazole (DIFLUCAN) 100 MG tablet Take 1 tablet (100 mg total) by mouth daily. 5 tablet 1   lidocaine (LIDODERM) 5 % Place 1 patch onto the skin daily. Remove & Discard patch within 12 hours or as directed by MD 30 patch 0   naproxen sodium (ALEVE) 220 MG tablet Take 440 mg by mouth daily as needed (pain).     Olopatadine HCl (PATADAY OP) Place 1 drop into both eyes daily.     omeprazole (PRILOSEC) 40 MG capsule Take one tablet twice a day for 7 days, then daily 60 capsule 3   ondansetron (ZOFRAN ODT) 4 MG disintegrating tablet Take 1 tablet (4 mg total) by mouth every 8 (eight) hours as needed for nausea or vomiting. 30 tablet 3   prochlorperazine (COMPAZINE) 10 MG tablet Take 1 tablet (10 mg total) by mouth every 6 (six) hours as needed (Nausea or vomiting). 30 tablet 1   promethazine (PHENERGAN) 25 MG suppository Place 1 suppository (25 mg total) rectally every 6 (six) hours as needed for nausea or vomiting. 12 each 0   traMADol (ULTRAM) 50 MG tablet Take 1-2 tablets (50-100 mg total) by mouth every 6 (six) hours as needed. 120 tablet 0   tucatinib (TUKYSA) 150 MG tablet Take 1 tablet (150 mg total) by mouth 2 (two) times daily. Take every 12 hrs at the same time each day with or without a meal. 60 tablet 6   TURMERIC CURCUMIN PO Take 1 tablet by mouth daily.     No current facility-administered medications for this visit.    OBJECTIVE: White woman examined in a wheelchair  Vitals:   01/07/21 1226  BP: 132/76  Pulse: 86  Resp: 17  Temp: 97.9 F (36.6 C)  SpO2: 99%       Body mass index is 27.66 kg/m.   Wt Readings from Last 3 Encounters:  01/07/21 171 lb 6 oz (77.7 kg)  12/31/20 170 lb (77.1 kg)  12/05/20 179 lb 7.3 oz (81.4 kg)  ECOG FS:1  Sclerae unicteric, pupils equal and round, EOMs intact Wearing a mask No cervical or supraclavicular adenopathy Lungs no rales or  rhonchi Heart regular rate and rhythm Abd soft, nontender, positive bowel sounds MSK no focal spinal tenderness, no upper extremity lymphedema Neuro: nonfocal, well oriented, appropriate affect Breasts: Deferred   LAB RESULTS:  CMP     Component Value Date/Time   NA 134 (L) 01/07/2021 1216   K 4.2 01/07/2021 1216   CL 101 01/07/2021 1216   CO2 20 (L) 01/07/2021 1216   GLUCOSE 146 (H) 01/07/2021 1216   BUN 16 01/07/2021 1216  CREATININE 0.78 01/07/2021 1216   CREATININE 0.81 06/20/2020 0839   CALCIUM 9.6 01/07/2021 1216   PROT 7.2 01/07/2021 1216   ALBUMIN 3.2 (L) 01/07/2021 1216   AST 85 (H) 01/07/2021 1216   AST 19 06/20/2020 0839   ALT 91 (H) 01/07/2021 1216   ALT 14 06/20/2020 0839   ALKPHOS 188 (H) 01/07/2021 1216   BILITOT 0.3 01/07/2021 1216   BILITOT 0.5 06/20/2020 0839   GFRNONAA >60 01/07/2021 1216   GFRNONAA >60 06/20/2020 0839   GFRAA >60 06/13/2019 1138    Lab Results  Component Value Date   WBC 12.3 (H) 01/07/2021   NEUTROABS 10.5 (H) 01/07/2021   HGB 11.5 (L) 01/07/2021   HCT 34.9 (L) 01/07/2021   MCV 90.2 01/07/2021   PLT 243 01/07/2021    No results found for: LABCA2  No components found for: QVZDGL875  No results for input(s): INR in the last 168 hours.  No results found for: LABCA2  No results found for: CAN199  Lab Results  Component Value Date   CAN125 504.0 (H) 12/31/2020    Lab Results  Component Value Date   CAN153 603.0 (H) 12/31/2020    Lab Results  Component Value Date   CA2729 696.9 (H) 12/31/2020    No components found for: HGQUANT  No results found for: CEA1 / No results found for: CEA1   No results found for: AFPTUMOR  No results found for: [place]  No results found for: PSA1  Appointment on 01/07/2021  Component Date Value Ref Range Status   WBC 01/07/2021 12.3 (A) 4.0 - 10.5 K/uL Final   RBC 01/07/2021 3.87  3.87 - 5.11 MIL/uL Final   Hemoglobin 01/07/2021 11.5 (A) 12.0 - 15.0 g/dL Final   HCT  01/07/2021 34.9 (A) 36.0 - 46.0 % Final   MCV 01/07/2021 90.2  80.0 - 100.0 fL Final   MCH 01/07/2021 29.7  26.0 - 34.0 pg Final   MCHC 01/07/2021 33.0  30.0 - 36.0 g/dL Final   RDW 01/07/2021 13.1  11.5 - 15.5 % Final   Platelets 01/07/2021 243  150 - 400 K/uL Final   nRBC 01/07/2021 0.0  0.0 - 0.2 % Final   Neutrophils Relative % 01/07/2021 86  % Final   Neutro Abs 01/07/2021 10.5 (A) 1.7 - 7.7 K/uL Final   Lymphocytes Relative 01/07/2021 3  % Final   Lymphs Abs 01/07/2021 0.4 (A) 0.7 - 4.0 K/uL Final   Monocytes Relative 01/07/2021 6  % Final   Monocytes Absolute 01/07/2021 0.7  0.1 - 1.0 K/uL Final   Eosinophils Relative 01/07/2021 0  % Final   Eosinophils Absolute 01/07/2021 0.0  0.0 - 0.5 K/uL Final   Basophils Relative 01/07/2021 1  % Final   Basophils Absolute 01/07/2021 0.1  0.0 - 0.1 K/uL Final   Immature Granulocytes 01/07/2021 4  % Final   Abs Immature Granulocytes 01/07/2021 0.53 (A) 0.00 - 0.07 K/uL Final   Performed at [facility], [place] [address] [place], [place] [address]   Sodium 01/07/2021 134 (A) 135 - 145 mmol/L Final   Potassium 01/07/2021 4.2  3.5 - 5.1 mmol/L Final   Chloride 01/07/2021 101  98 - 111 mmol/L Final   CO2 01/07/2021 20 (A) 22 - 32 mmol/L Final   Glucose, Bld 01/07/2021 146 (A) 70 - 99 mg/dL Final   Glucose reference range applies only to samples taken after fasting for at least 8 hours.   BUN 01/07/2021 16  6 -  20 mg/dL Final   Creatinine, Ser 01/07/2021 0.78  0.44 - 1.00 mg/dL Final   Calcium 01/07/2021 9.6  8.9 - 10.3 mg/dL Final   Total Protein 01/07/2021 7.2  6.5 - 8.1 g/dL Final   Albumin 01/07/2021 3.2 (A) 3.5 - 5.0 g/dL Final   AST 01/07/2021 85 (A) 15 - 41 U/L Final   ALT 01/07/2021 91 (A) 0 - 44 U/L Final   Alkaline Phosphatase 01/07/2021 188 (A) 38 - 126 U/L Final   Total Bilirubin 01/07/2021 0.3  0.3 - 1.2 mg/dL Final   GFR, Estimated 01/07/2021 >60  >60 mL/min Final   Comment: (NOTE) Calculated using the  CKD-EPI Creatinine Equation (2021)    Anion gap 01/07/2021 13  5 - 15 Final   Performed at [facility], [place] [address] [place], [place] [address]    (this displays the last labs from the last 3 days)  No results found for: TOTALPROTELP, ALBUMINELP, A1GS, A2GS, BETS, BETA2SER, GAMS, MSPIKE, SPEI (this displays SPEP labs)  No results found for: KPAFRELGTCHN, LAMBDASER, KAPLAMBRATIO (kappa/lambda light chains)  No results found for: HGBA, HGBA2QUANT, HGBFQUANT, HGBSQUAN (Hemoglobinopathy evaluation)   No results found for: LDH  No results found for: IRON, TIBC, IRONPCTSAT (Iron and TIBC)  No results found for: FERRITIN  Urinalysis    Component Value Date/Time   COLORURINE YELLOW 11/30/2020 1315   APPEARANCEUR HAZY (A) 11/30/2020 1315   LABSPEC 1.009 11/30/2020 1315   PHURINE 5.0 11/30/2020 [place] 11/30/2020 [place] 11/30/2020 [place] 11/30/2020 [place] 11/30/2020 1315   PROTEINUR NEGATIVE 11/30/2020 1315   NITRITE NEGATIVE 11/30/2020 1315   LEUKOCYTESUR TRACE (A) 11/30/2020 1315    STUDIES: DG Chest Portable 1 View  Result Date: 12/28/2020 CLINICAL DATA:  Chest pain. Additional history provided: Patient reports chest pain in middle of chest. Nausea/vomiting since yesterday. History of breast cancer. EXAM: PORTABLE CHEST 1 VIEW COMPARISON:  Prior chest radiographs 11/15/2020 and earlier. CT chest 11/30/2020. FINDINGS: Heart size within normal limits. Multiple known bilateral pulmonary nodules, better appreciated on the prior chest CT of 11/30/2020. Ill-defined opacity within the right mid lung field, likely corresponds with a known dominant 2.2 cm right middle lobe nodule. No appreciable superimposed pneumonia or pulmonary edema. No evidence of pleural effusion or pneumothorax. Known widespread osseous metastatic disease. T9, T11, T12 and L2 pathologic compression fractures were  better appreciated on the prior CT. IMPRESSION: Multiple known bilateral pulmonary nodules, better appreciated on the prior chest CT of 11/30/2020. Ill-defined opacity within the right mid lung, likely corresponds with the known dominant 2.2 cm right middle lobe nodule. No appreciable superimposed pneumonia or pulmonary edema. Known widespread osseous metastatic disease. T9, T11, T12 and L2 pathologic compression fractures were better appreciated on the prior CT. Electronically Signed   By: Kellie Simmering DO   On: 12/28/2020 11:51      ELIGIBLE FOR AVAILABLE RESEARCH PROTOCOL: BCEP  ASSESSMENT: 50 y.o.  [place], [place] woman status post right breast upper inner quadrant biopsy 12/29/2017, for a clinical T1c N0, stage IA invasive ductal carcinoma, triple positive, with an MIB-1 of 80%.  (1) genetics testing 02/02/2018 though the CancerNext gene panel offered by [facility] showed no deleterious mutations in  APC, ATM, BARD1, BMPR1A,BRCA1, BRCA2, BRIP1, CDH1, CDK4, CDKN2A, CHEK2, DICER1, HOXB13, MLH1, MRE11A, MSH2, MSH6, MUTYH, NBN, NF1, PALB2, PMS2, POLD1, POLE, PTEN, RAD50, RAD51C, RAD51D, SMAD4, SMARCA4, STK11 and TP53 (sequencing and deletion/duplication);  EPCAM and GREM1 (deletion/duplication only).  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>A) )   (2) right lumpectomy and sentinel lymph node sampling 02/16/2018 showed a pT2 pN0, stage IB invasive ductal carcinoma, grade 3, with a positive inferior margin; a total of 5 lymph nodes were removed  (a) additional surgery 02/27/2018 cleared the margins  (3) started carboplatin, docetaxel, trastuzumab and pertuzumab 03/11/2018, repeated every 21 days x 6, last dose 07/18/2018.    (a) Pertuzumab omitted after cycle 1 due to diarrhea.  (b) Gemcitabine substituted for docetaxel starting with cycle 5 due to peripheral neuropathy  (4) continued trastuzumab to total 6 months (through February 2020).  (a) echocardiogram 01/21/2018  showed an ejection fraction in the 55-60% range  (b) repeat echocardiogram 06/14/2018 showed an ejection fraction in the 60-65%  (5) adjuvant radiation 08/15/2018 - 09/28/2018  Site/dose:   The patient initially received a dose of 50.4 Gy in 28 fractions to the right breast using whole-breast tangent fields. This was delivered using a 3-D conformal technique. The patient then received a boost to the seroma. This delivered an additional 10 Gy in 5 fractions using 12E, 9E electrons with a special teletherapy technique. The total dose was 60.4 Gy.   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, involving brain, liver, lungs, and bones (7) presenting 11/29/2020 with progressive back pain:             (a) non-contrast CT abd/pelvis (renal stone study) 11/29/2020 shows bone metastases             (b) CT chest/abd/pelvis with contrast 11/30/2020 shows multiple large liver masses, multiple pulmonary nodules, and widespread lytic bone lesions             (c) MRI brain shows at least 4 brain lesions, largest 3.8 cm             (d) total spinal MRI 11/30/2020 shows pathologic fractures at T9, T12, possibly L2, as well as numerous other spine lesions, but no epidural enhancement or cord compression             (e) liver biopsy 12/02/2020 shows adenocarcinoma, prognostic panel again triple positive             (f) pre-op repeat MRI 12/01/2020 shows additional brain lesions, at least 8 of which will be targets for SRS   (8) CNS treatment:             (a) [place] 12/05/2020             (b) surgery 12/06/2020 confirmed metastatic carcinoma, estrogen receptor positive, HER2 amplified, progesterone receptor negative, with an MIB-1 of 70%.   (9) XRT to spine 12/05/2020 through 12/23/2020 Site Technique Total Dose (Gy)  Dose per Fx (Gy) Completed Fx Beam Energies  Thoracic Spine: Spine_T9-L3 Complex 30/30 3 10/10 15X  Pelvis: Pelvis_sacrum Complex 30/30 3 10/10 15X   (10) advanced directives-- plans to name son as HCPOA with her father as secondary   (64) to start capecitabine 01/08/2021, trastuzumab 01/15/2021, tucatinib 01/23/2021   (12) to start denosumab/Xgeva 01/15/2021, repeated every 6 weeks   PLAN: Kinlee is doing much better at controlling her pain.  She is going to continue either Tylenol and Aleve x3 daily plus tramadol as needed, or what she has  been taking more recently which is Aleve and tramadol 3 times a day.  Whichever best works to bring her pain to a low level is the one that she should continue.  She is not currently on a bowel prophylaxis regimen and she will certainly be constipated from the tramadol.  She does have MiraLAX and stool softeners at home but is concerned that the capecitabine may cause diarrhea.  Accordingly she is going to "play this by ear" for now.  The trastuzumab had to be postponed until 01/15/2021 and I will see her that day briefly just to make sure she is doing well on her other medications.  She will start her capecitabine tomorrow 01/08/2021.  We are going to wait on the tucatinib until we have documented that she tolerates the capecitabine well as I would not be able to separate the various side effects from the medications otherwise.  I gave her a copy of the letter written to [employer] to see if we can have her son, and spent some time with her.  Total encounter time 35 minutes.Sarajane Jews C. Viral Schramm, MD  01/07/21 5:11 PM Medical Oncology and Hematology [place] [address], [place] [address] Tel. [phone]    Fax. [phone]   I, Wilburn Mylar, am acting as scribe for Dr. [name]. Barry Culverhouse.  I, Lurline Del MD, have reviewed the above documentation for accuracy and completeness, and I agree with the  above.   *Total Encounter Time as defined by the Centers for Medicare and Medicaid Services includes, in addition to the face-to-face time of a patient visit (documented in the note above) non-face-to-face time: obtaining and reviewing outside history, ordering and reviewing medications, tests or procedures, care coordination (communications with other health care professionals or caregivers) and documentation in the medical record.

## 2021-01-06 NOTE — Telephone Encounter (Signed)
Oral Chemotherapy Pharmacist Encounter   Attempted to reach patient to provide update and offer for initial counseling on oral medications: Tukysa (tucatinib) and Xeloda (capecitabine).   Notified by [facility] that they were able to obtain patient a copay card making out of pocket cost for Tukysa $5. Confirmed with patient earlier this AM that Xeloda has already been delivered to patients home from [place].  Left voicemail for patient to call back to discuss details of medication acquisition and initial counseling session.  Leron Croak, PharmD, BCPS Hematology/Oncology Clinical Pharmacist [facility] [phone] 01/06/2021 2:28 PM

## 2021-01-07 ENCOUNTER — Inpatient Hospital Stay (HOSPITAL_BASED_OUTPATIENT_CLINIC_OR_DEPARTMENT_OTHER): Payer: 59 | Admitting: Oncology

## 2021-01-07 ENCOUNTER — Inpatient Hospital Stay: Payer: 59

## 2021-01-07 ENCOUNTER — Other Ambulatory Visit: Payer: Self-pay

## 2021-01-07 ENCOUNTER — Telehealth: Payer: Self-pay | Admitting: Oncology

## 2021-01-07 ENCOUNTER — Telehealth: Payer: Self-pay | Admitting: *Deleted

## 2021-01-07 VITALS — BP 132/76 | HR 86 | Temp 97.9°F | Resp 17 | Ht 66.0 in | Wt 171.4 lb

## 2021-01-07 DIAGNOSIS — D496 Neoplasm of unspecified behavior of brain: Secondary | ICD-10-CM

## 2021-01-07 DIAGNOSIS — C7951 Secondary malignant neoplasm of bone: Secondary | ICD-10-CM

## 2021-01-07 DIAGNOSIS — C7801 Secondary malignant neoplasm of right lung: Secondary | ICD-10-CM

## 2021-01-07 DIAGNOSIS — Z170 Estrogen receptor positive status [ER+]: Secondary | ICD-10-CM | POA: Diagnosis not present

## 2021-01-07 DIAGNOSIS — C50211 Malignant neoplasm of upper-inner quadrant of right female breast: Secondary | ICD-10-CM

## 2021-01-07 DIAGNOSIS — C7931 Secondary malignant neoplasm of brain: Secondary | ICD-10-CM

## 2021-01-07 DIAGNOSIS — C7989 Secondary malignant neoplasm of other specified sites: Secondary | ICD-10-CM | POA: Diagnosis not present

## 2021-01-07 DIAGNOSIS — C7802 Secondary malignant neoplasm of left lung: Secondary | ICD-10-CM

## 2021-01-07 LAB — CBC WITH DIFFERENTIAL/PLATELET
Abs Immature Granulocytes: 0.53 10*3/uL — ABNORMAL HIGH (ref 0.00–0.07)
Basophils Absolute: 0.1 10*3/uL (ref 0.0–0.1)
Basophils Relative: 1 %
Eosinophils Absolute: 0 10*3/uL (ref 0.0–0.5)
Eosinophils Relative: 0 %
HCT: 34.9 % — ABNORMAL LOW (ref 36.0–46.0)
Hemoglobin: 11.5 g/dL — ABNORMAL LOW (ref 12.0–15.0)
Immature Granulocytes: 4 %
Lymphocytes Relative: 3 %
Lymphs Abs: 0.4 10*3/uL — ABNORMAL LOW (ref 0.7–4.0)
MCH: 29.7 pg (ref 26.0–34.0)
MCHC: 33 g/dL (ref 30.0–36.0)
MCV: 90.2 fL (ref 80.0–100.0)
Monocytes Absolute: 0.7 10*3/uL (ref 0.1–1.0)
Monocytes Relative: 6 %
Neutro Abs: 10.5 10*3/uL — ABNORMAL HIGH (ref 1.7–7.7)
Neutrophils Relative %: 86 %
Platelets: 243 10*3/uL (ref 150–400)
RBC: 3.87 MIL/uL (ref 3.87–5.11)
RDW: 13.1 % (ref 11.5–15.5)
WBC: 12.3 10*3/uL — ABNORMAL HIGH (ref 4.0–10.5)
nRBC: 0 % (ref 0.0–0.2)

## 2021-01-07 LAB — COMPREHENSIVE METABOLIC PANEL
ALT: 91 U/L — ABNORMAL HIGH (ref 0–44)
AST: 85 U/L — ABNORMAL HIGH (ref 15–41)
Albumin: 3.2 g/dL — ABNORMAL LOW (ref 3.5–5.0)
Alkaline Phosphatase: 188 U/L — ABNORMAL HIGH (ref 38–126)
Anion gap: 13 (ref 5–15)
BUN: 16 mg/dL (ref 6–20)
CO2: 20 mmol/L — ABNORMAL LOW (ref 22–32)
Calcium: 9.6 mg/dL (ref 8.9–10.3)
Chloride: 101 mmol/L (ref 98–111)
Creatinine, Ser: 0.78 mg/dL (ref 0.44–1.00)
GFR, Estimated: >60 mL/min (ref >60)
Glucose, Bld: 146 mg/dL — ABNORMAL HIGH (ref 70–99)
Potassium: 4.2 mmol/L (ref 3.5–5.1)
Sodium: 134 mmol/L — ABNORMAL LOW (ref 135–145)
Total Bilirubin: 0.3 mg/dL (ref 0.3–1.2)
Total Protein: 7.2 g/dL (ref 6.5–8.1)

## 2021-01-07 MED ORDER — DENOSUMAB 120 MG/1.7ML ~~LOC~~ SOLN
SUBCUTANEOUS | Status: AC
  Filled 2021-01-07: qty 1.7

## 2021-01-07 NOTE — Telephone Encounter (Signed)
VM received from Vascular lab stating order for ECHO needed 6/24 did not " drop into our que until today ".  Requested return call for further instructions.  This RN returned call to Vascular Lab and obtained VM- detailed message left stating to schedule asap.  This RN's name and desk number given for return call if needed.

## 2021-01-07 NOTE — Telephone Encounter (Signed)
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  Counseled patient on administration, dosing, side effects, monitoring, drug-food interactions, safe handling, storage, and disposal.  Patient will take Xeloda 573m tablets, 3 tablets (15050m by mouth in AM and 3 tabs (150023mby mouth in PM, within 30 minutes of finishing meals, on days 1-14 of each 21 day cycle.   Patient will take Tukysa 150m66mblets, 1 tablet (150mg59m mouth twice daily, without regard to food, taken continuously.  Trastuzumab will be administered at standard dosing IV every 3 weeks.   Xeloda and Tukysa start date: 01/07/21  Adverse effects of Xeloda include but are not limited to: fatigue, decreased blood counts, GI upset, diarrhea, mouth sores, and hand-foot syndrome.  Adverse effects of Tukysa include but are not limited to: palmar-plantar erythrodysesthesia, increased liver enzymes, hepatotoxicity, nausea, vomiting, mouth sores, decreased appetite, diarrhea, and electrolyte abnormalities.  Patient has anti-emetic on hand and knows to take it if nausea develops.   Patient will obtain anti diarrheal and alert the office of 4 or more loose stools above baseline.   Reviewed with patient importance of keeping a medication schedule and plan for any missed doses. No barriers to medication adherence identified.  Medication reconciliation performed and medication/allergy list updated. Discussed drug-drug interactions with patient - patient states that she is currently being weaned off of dexamethasone. We discussed DDIs between morphine and tramadol with Tukysa and possibility for increased concentrations of the morphine and tramadol.  Ms. HurycSwendsened she has not taken the morphine in weeks, and will monitor for any s/sx of feeling overly sedated/lethargic with tramadol. Lastly we discussed the drug-drug interaction between omeprazole and Xeloda. Ms. HurycBorbaes she is currently being tapered off of this to a low dose and is aware of potential for decreased efficacy of Xeloda when used in combination with proton-pump inhibitors.   InsurBiomedical engineerXeloda and TukysLaury Axon been obtained. [place] [facility] and TukysLaury Axon be delivered 01/07/21.  All questions answered.  Ms. HurycEischeided understanding and appreciation.   Medication education handout placed in mail for patient. Patient knows to call the office with questions or concerns. Oral Chemotherapy Clinic phone number provided to patient.   RebecLeron CroakrmD, BCPS Hematology/Oncology Clinical Pharmacist [phone]/2022 9:36 AM

## 2021-01-07 NOTE — Telephone Encounter (Signed)
Scheduled appointment per 06.28 los. Patient is aware.

## 2021-01-08 ENCOUNTER — Other Ambulatory Visit: Payer: Self-pay | Admitting: *Deleted

## 2021-01-08 ENCOUNTER — Encounter: Payer: Self-pay | Admitting: Licensed Clinical Social Worker

## 2021-01-08 ENCOUNTER — Encounter: Payer: Self-pay | Admitting: Oncology

## 2021-01-08 LAB — CANCER ANTIGEN 27.29: CA 27.29: 810.4 U/mL — ABNORMAL HIGH (ref 0.0–38.6)

## 2021-01-08 LAB — CANCER ANTIGEN 15-3: CA 15-3: 648 U/mL — ABNORMAL HIGH (ref 0.0–25.0)

## 2021-01-08 LAB — CA 125: Cancer Antigen (CA) 125: 535 U/mL — ABNORMAL HIGH (ref 0.0–38.1)

## 2021-01-08 MED ORDER — FLUCONAZOLE 100 MG PO TABS: 100.0000 mg | ORAL_TABLET | Freq: Every day | ORAL | 1 refills | Status: DC

## 2021-01-08 NOTE — Progress Notes (Unsigned)
Received request from Selinda Eon in social work regarding grant funds.  Responded back patient has used all her [employer].

## 2021-01-08 NOTE — Telephone Encounter (Signed)
Pt called to this RN to inquire about continued use of diflucan discussed at visit yesterday and obtaining a refill.  Per phone discussion- refill will be sent for larger quantity with pt verbalizing understanding to only use for 5 days - but due to possible future need while undergoing chemotherapy she may need to use intermittently. ( Above allows for greater quantity per co pay )  Refill sent to pt's pharmacy.

## 2021-01-08 NOTE — Progress Notes (Signed)
[place] Work  [employer] representative received VM from patient asking about status of Medicaid application that was started while she was inpatient. CSW reached out to [employer] who helped with the process and they will check status and update the patient.   CSW contacted patient to inform her of the above. Patient also asked about any other financial assistance. CSW reviewed cancer foundations and e-mailed info to patient. CSW also reached out to financial advocates to determine if pt has an funds left through the [employer].     [place], [place] Worker [employer]

## 2021-01-09 ENCOUNTER — Other Ambulatory Visit: Payer: Self-pay

## 2021-01-09 ENCOUNTER — Ambulatory Visit
Admission: RE | Admit: 2021-01-09 | Discharge: 2021-01-09 | Disposition: A | Discharge status: Home / Self Care | Payer: No Typology Code available for payment source | Source: Ambulatory Visit | Attending: Oncology | Admitting: Oncology

## 2021-01-09 DIAGNOSIS — C50211 Malignant neoplasm of upper-inner quadrant of right female breast: Secondary | ICD-10-CM

## 2021-01-09 DIAGNOSIS — Z170 Estrogen receptor positive status [ER+]: Secondary | ICD-10-CM

## 2021-01-09 DIAGNOSIS — C7951 Secondary malignant neoplasm of bone: Secondary | ICD-10-CM

## 2021-01-09 NOTE — Consult Note (Signed)
Chief Complaint: Patient was seen in consultation today for painful pathologic fractures at the request of Magrinat,Gustav C  Referring Physician(s): Magrinat,Gustav C  History of Present Illness: Monica [name] is a 50 y.o. 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  She has since undergone surgical resection with neo adjuvant CTX and RTX.  Unfortunately, she was recently diagnosed with extensive metastatic disease (brain, liver, bones, lungs) in May of 2022.  She presents today to discuss multiple vertebral and iliac pathologic fractures.  She has significant pain in the posterior left iliac bone, lumbar spine and right hip when standing and/or ambulating.  She rates the pain at a 7-8/10 when upright.   Additionally, she has 7-8/10 pain in the lower thoracic spine when laying down and sleeping at night.  This pain makes it difficult to change positions and can awaken her from sleep.  Overall, he multi-focal pain is quite distressing and affects her quality of life.  Denies lower extremity weakness, paresthesias or other signs of myelopathy.  Past Medical History:  Diagnosis Date   Arthritis    lower back   Cancer [facility])    right breast cancer   Family history of breast cancer    Personal history of chemotherapy    Personal history of radiation therapy     Past Surgical History:  Procedure Laterality Date   ABDOMINAL HYSTERECTOMY     APPLICATION OF CRANIAL NAVIGATION N/A 12/06/2020   Procedure: APPLICATION OF CRANIAL NAVIGATION;  Surgeon: Judith Part, MD;  Location: [place];  Service: Neurosurgery;  Laterality: N/A;  RM 20   BREAST LUMPECTOMY Right    BREAST LUMPECTOMY WITH RADIOACTIVE SEED AND SENTINEL LYMPH NODE BIOPSY Right 02/16/2018   Procedure: RIGHT BREAST LUMPECTOMY WITH RADIOACTIVE SEED AND SENTINEL LYMPH NODE BIOPSY;  Surgeon: [name], MD;  Location: [place];  Service: General;  Laterality:  Right;   CRANIOTOMY Right 12/06/2020   Procedure: Right sided awake craniotomy for tumor resection;  Surgeon: Judith Part, MD;  Location: [place];  Service: Neurosurgery;  Laterality: Right;   HYMENECTOMY     IR US GUIDE BX ASP/DRAIN  12/02/2020   PORTACATH PLACEMENT Right 02/16/2018   Procedure: INSERTION PORT-A-CATH;  Surgeon: [name], MD;  Location: [place];  Service: General;  Laterality: Right;   RE-EXCISION OF BREAST LUMPECTOMY Right 02/22/2018   Procedure: RE-EXCISION OF RIGHT  BREAST LUMPECTOMY;  Surgeon: [name], MD;  Location: [place];  Service: General;  Laterality: Right;   REPAIR VAGINAL CUFF N/A 02/07/2017   Procedure: REPAIR VAGINAL CUFF;  Surgeon: Ena Dawley, MD;  Location: [facility];  Service: Gynecology;  Laterality: N/A;   ROBOTIC ASSISTED TOTAL HYSTERECTOMY WITH SALPINGECTOMY Left 01/20/2017   Procedure: ROBOTIC ASSISTED TOTAL HYSTERECTOMY WITH SALPINGECTOMY;  Surgeon: [name], MD;  Location: [facility];  Service: Gynecology;  Laterality: Left;    Allergies: Patient has no known allergies.  Medications: Prior to Admission medications   Medication Sig Start Date End Date Taking? Authorizing Provider  acetaminophen (TYLENOL) 500 MG tablet Take 500 mg by mouth every 6 (six) hours as needed for moderate pain.    [provider]  capecitabine (XELODA) 500 MG tablet Take 3 tablets (1,500 mg total) by mouth 2 (two) times daily before a meal. Take for 14 days on, 7 days off. Repeat every 21 days. 01/01/21   Magrinat, [name], MD  cholecalciferol (VITAMIN D3) 25 MCG (  1000 UNIT) tablet Take 1 tablet (1,000 Units total) by mouth daily. 06/20/20   Magrinat, [name], MD  dexamethasone (DECADRON) 4 MG tablet Take 0.5 tablets (2 mg total) by mouth 2 (two) times daily with breakfast and lunch. 12/11/20   Judith Part, MD  fluconazole (DIFLUCAN) 100 MG tablet Take 1 tablet (100 mg total) by mouth daily. 01/08/21   Magrinat,  [name], MD  lidocaine (LIDODERM) 5 % Place 1 patch onto the skin daily. Remove & Discard patch within 12 hours or as directed by MD 12/10/20   Judith Part, MD  naproxen sodium (ALEVE) 220 MG tablet Take 440 mg by mouth daily as needed (pain).    [provider]  Olopatadine HCl (PATADAY OP) Place 1 drop into both eyes daily.    [provider]  omeprazole (PRILOSEC) 40 MG capsule Take one tablet twice a day for 7 days, then daily 12/31/20   Magrinat, [name], MD  ondansetron (ZOFRAN ODT) 4 MG disintegrating tablet Take 1 tablet (4 mg total) by mouth every 8 (eight) hours as needed for nausea or vomiting. 12/31/20   Magrinat, [name], MD  prochlorperazine (COMPAZINE) 10 MG tablet Take 1 tablet (10 mg total) by mouth every 6 (six) hours as needed (Nausea or vomiting). 12/31/20   Magrinat, [name], MD  promethazine (PHENERGAN) 25 MG suppository Place 1 suppository (25 mg total) rectally every 6 (six) hours as needed for nausea or vomiting. 12/28/20   Barrie Folk, PA-C  traMADol (ULTRAM) 50 MG tablet Take 1-2 tablets (50-100 mg total) by mouth every 6 (six) hours as needed. 01/06/21   Magrinat, [name], MD  tucatinib (TUKYSA) 150 MG tablet Take 1 tablet (150 mg total) by mouth 2 (two) times daily. Take every 12 hrs at the same time each day with or without a meal. 01/01/21   Magrinat, [name], MD  TURMERIC CURCUMIN PO Take 1 tablet by mouth daily.    [provider]     Family History  Problem Relation Age of Onset   Lung cancer Maternal Grandfather    Esophageal cancer Paternal Grandfather 40   Breast cancer Cousin 65    Social History   Socioeconomic History   Marital status: Legally Separated    Spouse name: Not on file   Number of children: Not on file   Years of education: Not on file   Highest education level: Not on file  Occupational History   Not on file  Tobacco Use   Smoking status: Never   Smokeless tobacco: Never  Vaping Use    Vaping Use: Some days  Substance and Sexual Activity   Alcohol use: Yes    Comment: occ   Drug use: No   Sexual activity: Not Currently    Birth control/protection: Surgical  Other Topics Concern   Not on file  Social History Narrative   Not on file   Social Determinants of Health   Financial Resource Strain: Not on file  Food Insecurity: Not on file  Transportation Needs: Not on file  Physical Activity: Not on file  Stress: Not on file  Social Connections: Not on file    ECOG Status: 2 - Symptomatic, <50% confined to bed  Review of Systems: A 12 point ROS discussed and pertinent positives are indicated in the HPI above.  All other systems are negative.  Review of Systems  Vital Signs: BP 113/72 (BP Location: Left Arm, Patient Position: Sitting, Cuff Size: Normal)  Pulse 94   Temp 98 F (36.7 C)   Resp 18   Wt 77.6 kg   LMP 12/21/2016 (Exact Date)   SpO2 99% Comment: room air  BMI 27.60 kg/m   Physical Exam Vitals reviewed.  Constitutional:      General: She is not in acute distress.    Appearance: Normal appearance.  HENT:     Head: Normocephalic and atraumatic.  Eyes:     General: No scleral icterus. Cardiovascular:     Rate and Rhythm: Normal rate and regular rhythm.  Pulmonary:     Effort: Pulmonary effort is normal.  Abdominal:     General: Abdomen is flat.     Palpations: Abdomen is soft.  Musculoskeletal:       Back:     Comments: TTP lower T-spine from T9-T12 TTP lumbar spine approximately L2 and L4 TTP left posterior ischium  Skin:    General: Skin is warm and dry.  Neurological:     Mental Status: She is alert.  Psychiatric:        Mood and Affect: Mood normal.        Behavior: Behavior normal.      Imaging: DG Chest Portable 1 View  Result Date: 12/28/2020 CLINICAL DATA:  Chest pain. Additional history provided: Patient reports chest pain in middle of chest. Nausea/vomiting since yesterday. History of breast cancer. EXAM:  PORTABLE CHEST 1 VIEW COMPARISON:  Prior chest radiographs 11/15/2020 and earlier. CT chest 11/30/2020. FINDINGS: Heart size within normal limits. Multiple known bilateral pulmonary nodules, better appreciated on the prior chest CT of 11/30/2020. Ill-defined opacity within the right mid lung field, likely corresponds with a known dominant 2.2 cm right middle lobe nodule. No appreciable superimposed pneumonia or pulmonary edema. No evidence of pleural effusion or pneumothorax. Known widespread osseous metastatic disease. T9, T11, T12 and L2 pathologic compression fractures were better appreciated on the prior CT. IMPRESSION: Multiple known bilateral pulmonary nodules, better appreciated on the prior chest CT of 11/30/2020. Ill-defined opacity within the right mid lung, likely corresponds with the known dominant 2.2 cm right middle lobe nodule. No appreciable superimposed pneumonia or pulmonary edema. Known widespread osseous metastatic disease. T9, T11, T12 and L2 pathologic compression fractures were better appreciated on the prior CT. Electronically Signed   By: Kellie Simmering DO   On: 12/28/2020 11:51    Labs:  CBC: Recent Labs    12/06/20 0254 12/28/20 1110 12/31/20 1540 01/07/21 1216  WBC 14.1* 6.2 4.8 12.3*  HGB 12.3 12.2 11.6* 11.5*  HCT 37.1 37.7 35.0* 34.9*  PLT 323 167 130* 243    COAGS: Recent Labs    12/02/20 0612 12/06/20 0254  INR 1.0 1.0    BMP: Recent Labs    12/06/20 0254 12/28/20 1110 12/31/20 1540 01/07/21 1216  NA 138 136 138 134*  K 3.8 3.7 3.6 4.2  CL 102 100 103 101  CO2 29 26 23  20*  GLUCOSE 95 113* 133* 146*  BUN 17 13 15 16   CALCIUM 9.6 10.6* 9.8 9.6  CREATININE 0.78 0.84 0.78 0.78  GFRNONAA >60 >60 >60 >60    LIVER FUNCTION TESTS: Recent Labs    11/30/20 1153 12/28/20 1110 12/31/20 1540 01/07/21 1216  BILITOT 0.4 0.5 0.3 0.3  AST 39 67* 74* 85*  ALT 34 59* 80* 91*  ALKPHOS 85 131* 146* 188*  PROT 6.9 6.9 6.8 7.2  ALBUMIN 3.5 3.4* 3.0*  3.2*    TUMOR MARKERS: No results for input(s): AFPTM,  CEA, CA199, CHROMGRNA in the last 8760 hours.  Assessment and Plan:  50 yo female with extensive osseous metastases (breast ca primary) resulting in painful pathologic fractures at T9, T10, T11, T12, L2 and L4 as well as the posterior aspect of the right iliac bone.  She has already undergone whole spine RTX.  We discussed the risks benefits and alternatives to combined RFA and cement augmentation (osteocool).  She understands this is a palliative therapy with the goal of pain reduction, QOL improvement and decreased risk of continued heigh loss.  She understands and strongly desires to proceed.   Given the complexity of her disease, we discussed pursuing two separate treatment sessions, the first targeting L2, L4 and the left iliac lesion and the 2nd session targeting T9-T12. SHe understands and desires to proceed.  Procedures to be done at [facility].      Thank you for this interesting consult.  I greatly enjoyed meeting Monica [name] and look forward to participating in their care.  A copy of this report was sent to the requesting provider on this date.  Electronically Signed: Criselda Peaches 01/09/2021, 12:46 PM   I spent a total of 40 Minutes  in face to face in clinical consultation, greater than 50% of which was counseling/coordinating care for multifocal osseous metastatic disease with pathologic fractures.

## 2021-01-10 NOTE — Progress Notes (Signed)
..  The following Medication: Delton See is approved for drug replacement program by [employer]. The enrollment period is from 01/09/2021 to 01/09/2022.  Reason for Assistance: Self Pay. ID: [id number] First DOS:01/15/2021.

## 2021-01-14 ENCOUNTER — Ambulatory Visit (HOSPITAL_BASED_OUTPATIENT_CLINIC_OR_DEPARTMENT_OTHER)
Admission: RE | Admit: 2021-01-14 | Discharge: 2021-01-14 | Disposition: A | Discharge status: Home / Self Care | Payer: 59 | Source: Ambulatory Visit | Attending: Oncology | Admitting: Oncology

## 2021-01-14 ENCOUNTER — Observation Stay (HOSPITAL_COMMUNITY): Payer: 59

## 2021-01-14 ENCOUNTER — Other Ambulatory Visit: Payer: Self-pay

## 2021-01-14 ENCOUNTER — Inpatient Hospital Stay (HOSPITAL_COMMUNITY)
Admission: AD | Admit: 2021-01-14 | Discharge: 2021-01-21 | DRG: 948 | Disposition: A | Discharge status: Other Acute Inpatient Hospital | Payer: 59 | Source: Ambulatory Visit | Attending: Internal Medicine | Admitting: Internal Medicine

## 2021-01-14 ENCOUNTER — Encounter: Payer: Self-pay | Admitting: Oncology

## 2021-01-14 ENCOUNTER — Encounter (HOSPITAL_COMMUNITY): Payer: Self-pay | Admitting: Internal Medicine

## 2021-01-14 ENCOUNTER — Telehealth: Payer: Self-pay | Admitting: *Deleted

## 2021-01-14 DIAGNOSIS — C7989 Secondary malignant neoplasm of other specified sites: Secondary | ICD-10-CM | POA: Insufficient documentation

## 2021-01-14 DIAGNOSIS — Z20822 Contact with and (suspected) exposure to covid-19: Secondary | ICD-10-CM | POA: Diagnosis present

## 2021-01-14 DIAGNOSIS — C7951 Secondary malignant neoplasm of bone: Secondary | ICD-10-CM

## 2021-01-14 DIAGNOSIS — M8448XA Pathological fracture, other site, initial encounter for fracture: Secondary | ICD-10-CM | POA: Diagnosis present

## 2021-01-14 DIAGNOSIS — Z90710 Acquired absence of both cervix and uterus: Secondary | ICD-10-CM

## 2021-01-14 DIAGNOSIS — L719 Rosacea, unspecified: Secondary | ICD-10-CM | POA: Diagnosis not present

## 2021-01-14 DIAGNOSIS — C7931 Secondary malignant neoplasm of brain: Secondary | ICD-10-CM | POA: Insufficient documentation

## 2021-01-14 DIAGNOSIS — C7801 Secondary malignant neoplasm of right lung: Secondary | ICD-10-CM

## 2021-01-14 DIAGNOSIS — Z7401 Bed confinement status: Secondary | ICD-10-CM

## 2021-01-14 DIAGNOSIS — E876 Hypokalemia: Secondary | ICD-10-CM

## 2021-01-14 DIAGNOSIS — G629 Polyneuropathy, unspecified: Secondary | ICD-10-CM | POA: Diagnosis present

## 2021-01-14 DIAGNOSIS — Z0189 Encounter for other specified special examinations: Secondary | ICD-10-CM | POA: Diagnosis not present

## 2021-01-14 DIAGNOSIS — D630 Anemia in neoplastic disease: Secondary | ICD-10-CM | POA: Diagnosis present

## 2021-01-14 DIAGNOSIS — Z170 Estrogen receptor positive status [ER+]: Secondary | ICD-10-CM

## 2021-01-14 DIAGNOSIS — D496 Neoplasm of unspecified behavior of brain: Secondary | ICD-10-CM

## 2021-01-14 DIAGNOSIS — C50211 Malignant neoplasm of upper-inner quadrant of right female breast: Secondary | ICD-10-CM | POA: Insufficient documentation

## 2021-01-14 DIAGNOSIS — G893 Neoplasm related pain (acute) (chronic): Principal | ICD-10-CM | POA: Diagnosis present

## 2021-01-14 DIAGNOSIS — Z923 Personal history of irradiation: Secondary | ICD-10-CM

## 2021-01-14 DIAGNOSIS — Z79899 Other long term (current) drug therapy: Secondary | ICD-10-CM

## 2021-01-14 DIAGNOSIS — Z01810 Encounter for preprocedural cardiovascular examination: Secondary | ICD-10-CM | POA: Insufficient documentation

## 2021-01-14 DIAGNOSIS — Z801 Family history of malignant neoplasm of trachea, bronchus and lung: Secondary | ICD-10-CM

## 2021-01-14 DIAGNOSIS — R52 Pain, unspecified: Secondary | ICD-10-CM | POA: Diagnosis present

## 2021-01-14 DIAGNOSIS — Z9221 Personal history of antineoplastic chemotherapy: Secondary | ICD-10-CM

## 2021-01-14 DIAGNOSIS — Z79810 Long term (current) use of selective estrogen receptor modulators (SERMs): Secondary | ICD-10-CM

## 2021-01-14 DIAGNOSIS — K5900 Constipation, unspecified: Secondary | ICD-10-CM | POA: Diagnosis present

## 2021-01-14 DIAGNOSIS — Z800 Family history of malignant neoplasm of digestive organs: Secondary | ICD-10-CM

## 2021-01-14 DIAGNOSIS — C7802 Secondary malignant neoplasm of left lung: Secondary | ICD-10-CM | POA: Diagnosis present

## 2021-01-14 DIAGNOSIS — Z803 Family history of malignant neoplasm of breast: Secondary | ICD-10-CM

## 2021-01-14 DIAGNOSIS — C787 Secondary malignant neoplasm of liver and intrahepatic bile duct: Secondary | ICD-10-CM

## 2021-01-14 DIAGNOSIS — Z7952 Long term (current) use of systemic steroids: Secondary | ICD-10-CM

## 2021-01-14 LAB — CBC WITH DIFFERENTIAL/PLATELET
Abs Immature Granulocytes: 0.21 10*3/uL — ABNORMAL HIGH (ref 0.00–0.07)
Basophils Absolute: 0.1 10*3/uL (ref 0.0–0.1)
Basophils Relative: 1 %
Eosinophils Absolute: 0.2 10*3/uL (ref 0.0–0.5)
Eosinophils Relative: 2 %
HCT: 37.3 % (ref 36.0–46.0)
Hemoglobin: 11.9 g/dL — ABNORMAL LOW (ref 12.0–15.0)
Immature Granulocytes: 2 %
Lymphocytes Relative: 7 %
Lymphs Abs: 0.7 10*3/uL (ref 0.7–4.0)
MCH: 30.1 pg (ref 26.0–34.0)
MCHC: 31.9 g/dL (ref 30.0–36.0)
MCV: 94.4 fL (ref 80.0–100.0)
Monocytes Absolute: 1.1 10*3/uL — ABNORMAL HIGH (ref 0.1–1.0)
Monocytes Relative: 11 %
Neutro Abs: 8.3 10*3/uL — ABNORMAL HIGH (ref 1.7–7.7)
Neutrophils Relative %: 77 %
Platelets: 270 10*3/uL (ref 150–400)
RBC: 3.95 MIL/uL (ref 3.87–5.11)
RDW: 13.6 % (ref 11.5–15.5)
WBC: 10.6 10*3/uL — ABNORMAL HIGH (ref 4.0–10.5)
nRBC: 0 % (ref 0.0–0.2)

## 2021-01-14 LAB — COMPREHENSIVE METABOLIC PANEL
ALT: 73 U/L — ABNORMAL HIGH (ref 0–44)
AST: 82 U/L — ABNORMAL HIGH (ref 15–41)
Albumin: 3.8 g/dL (ref 3.5–5.0)
Alkaline Phosphatase: 166 U/L — ABNORMAL HIGH (ref 38–126)
Anion gap: 11 (ref 5–15)
BUN: 18 mg/dL (ref 6–20)
CO2: 29 mmol/L (ref 22–32)
Calcium: 10.5 mg/dL — ABNORMAL HIGH (ref 8.9–10.3)
Chloride: 97 mmol/L — ABNORMAL LOW (ref 98–111)
Creatinine, Ser: 0.84 mg/dL (ref 0.44–1.00)
GFR, Estimated: >60 mL/min (ref >60)
Glucose, Bld: 101 mg/dL — ABNORMAL HIGH (ref 70–99)
Potassium: 3.6 mmol/L (ref 3.5–5.1)
Sodium: 137 mmol/L (ref 135–145)
Total Bilirubin: 0.8 mg/dL (ref 0.3–1.2)
Total Protein: 7.3 g/dL (ref 6.5–8.1)

## 2021-01-14 LAB — ECHOCARDIOGRAM COMPLETE
Area-P 1/2: 3.68 cm2
S' Lateral: 2.5 cm
Single Plane A4C EF: 59 %

## 2021-01-14 IMAGING — CT CT PELVIS W/O CM
2 of 6 series · 14 of 46 positions shown, 16 images · non-contrast
Comparison: CT abdomen/pelvis dated [DATE]
COMPARISON: CT abdomen/pelvis dated [DATE]

Addendum:
CLINICAL DATA: Breast cancer, pelvic pain, known osseous metastases

EXAM:
CT PELVIS WITHOUT CONTRAST
TECHNIQUE: Multidetector CT imaging of the pelvis was performed following the
standard protocol without intravenous contrast.

[Series 6: axial st · axial · 0.80mm/px · z∈[-455,-201]mm · 11 of 147 slices shown, 13 images]
[im 10/147  soft-tissue]
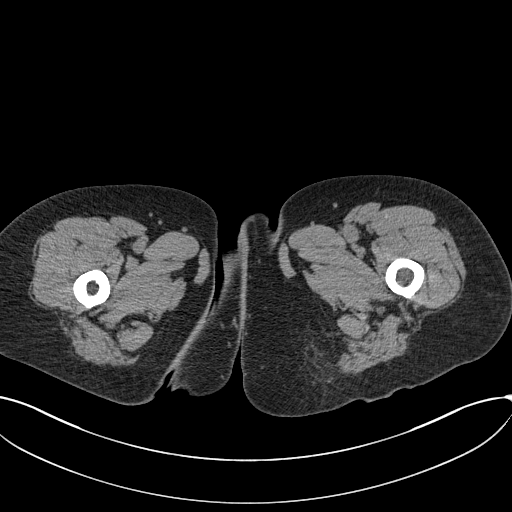
[im 10/147  bone]
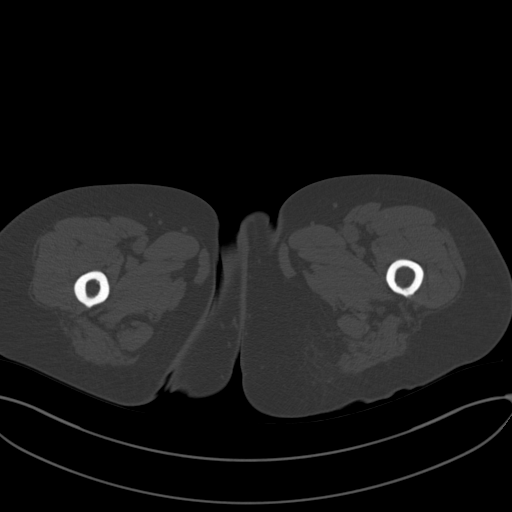
[im 20/147  soft-tissue]
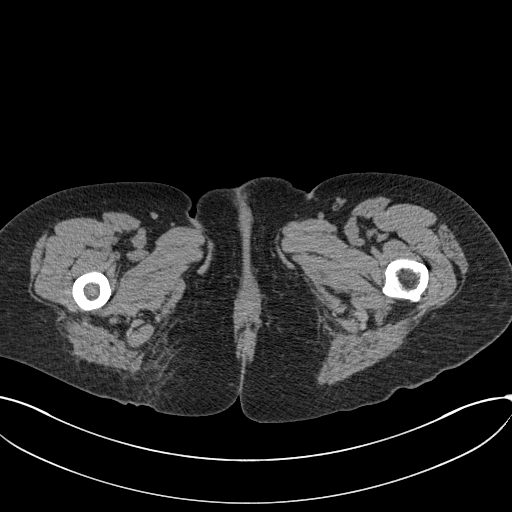
[im 39/147  soft-tissue]
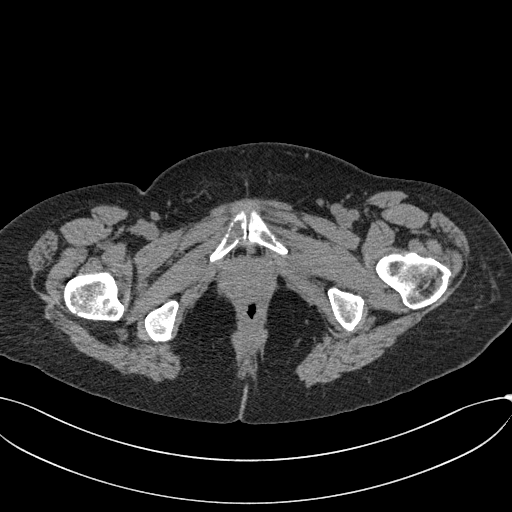
[im 49/147  soft-tissue]
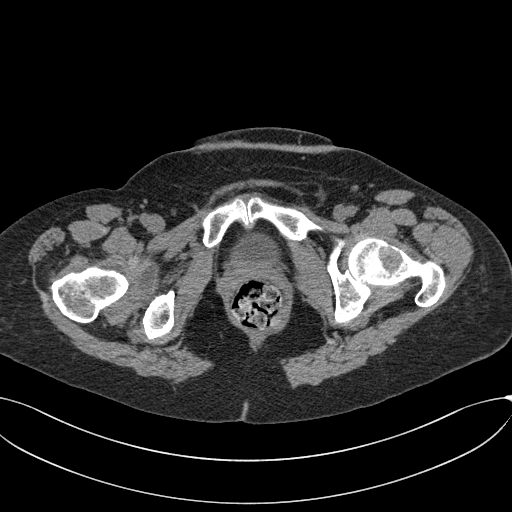
[im 59/147  soft-tissue]
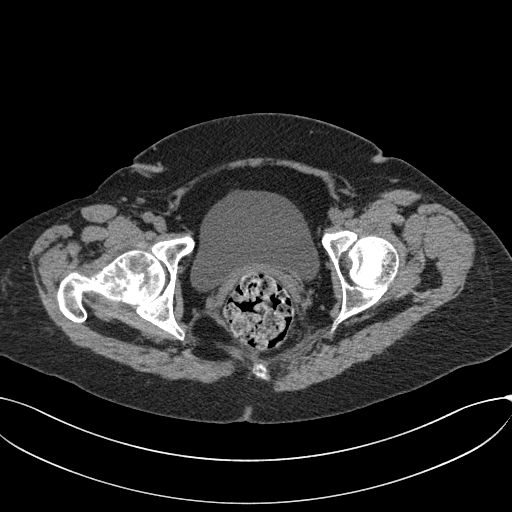
[im 78/147  soft-tissue]
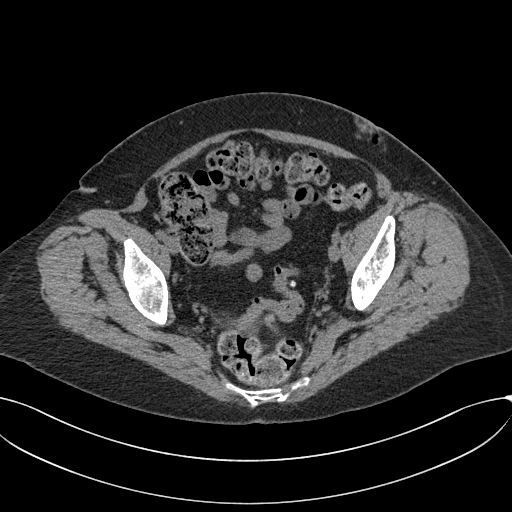
[im 88/147  soft-tissue]
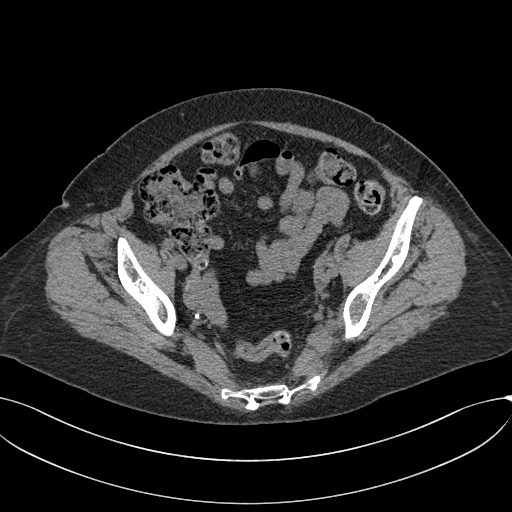
[im 98/147  soft-tissue]
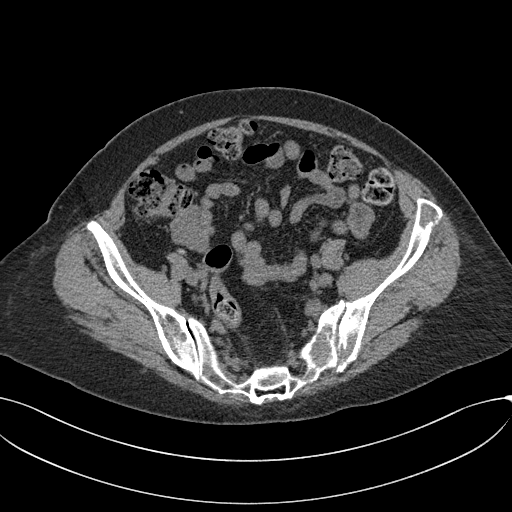
[im 108/147  soft-tissue]
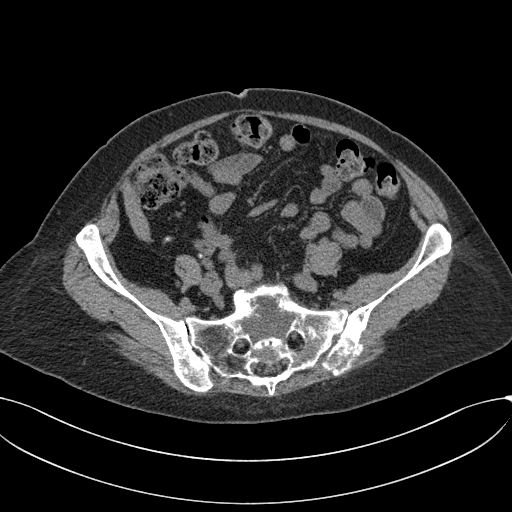
[im 108/147  bone]
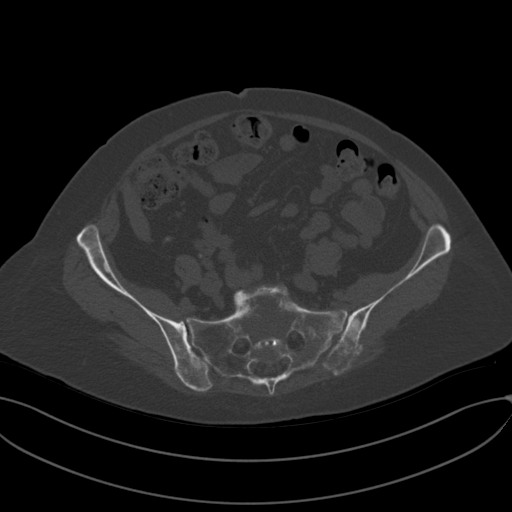
[im 127/147  soft-tissue]
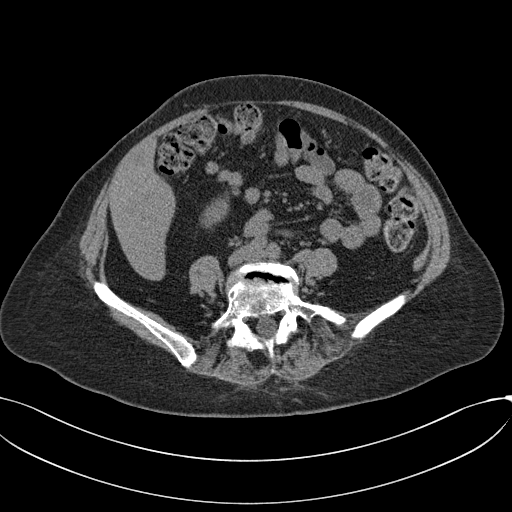
[im 137/147  soft-tissue]
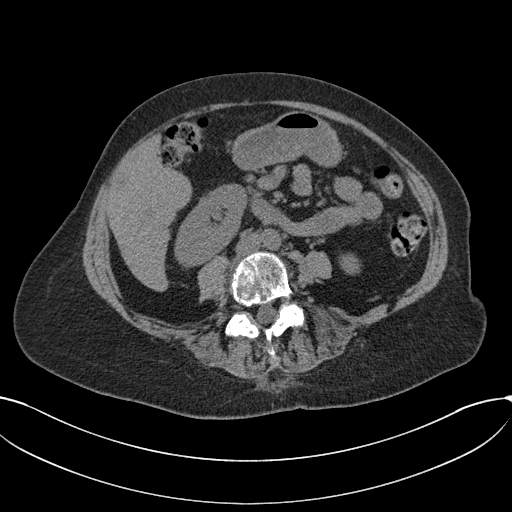

[Series 9: coronal st · coronal · 0.62mm/px · 3 of 150 slices shown]
[im 38/150  soft-tissue]
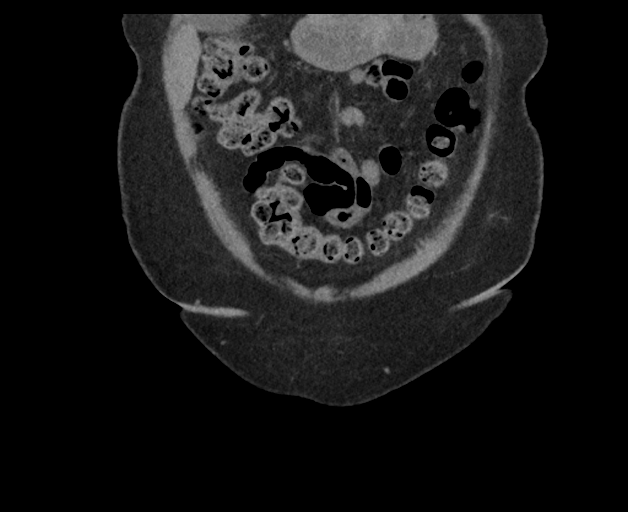
[im 75/150  soft-tissue]
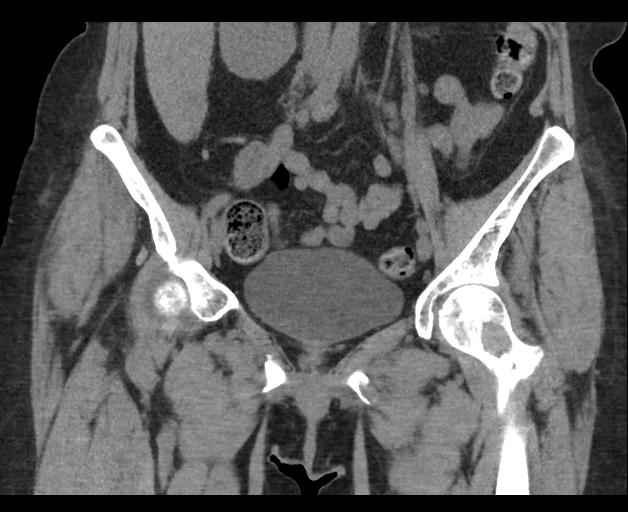
[im 112/150  soft-tissue]
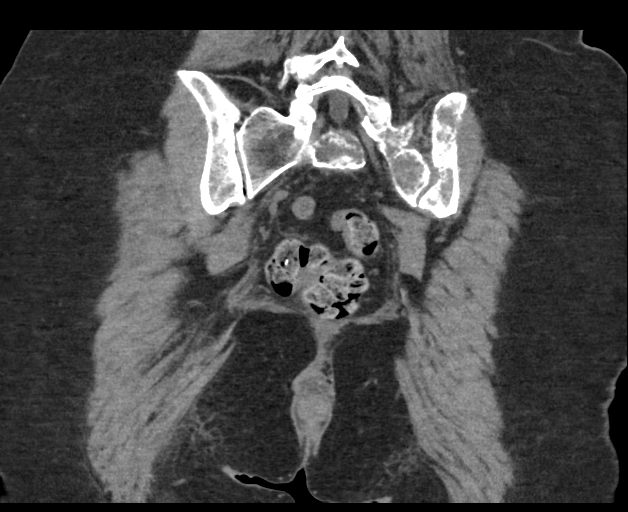

[14 of 46 positions shown; findings below may reference images not displayed]

FINDINGS: Urinary Tract:  Bladder is within normal limits.

Bowel:  Visualized bowel is unremarkable.

Vascular/Lymphatic: No evidence of aneurysm.

No suspicious pelvic lymphadenopathy.

Reproductive:  Status post hysterectomy.

Bilateral ovaries are within normal limits.

Other:  No pelvic ascites.

Subcutaneous injection sites along the left lower abdominal wall
(series 6/image 68).

Musculoskeletal: Interval superior endplate compression fracture
deformity at L4 (sagittal image 102), with 20% loss of height
centrally, but no retropulsion. This reflects a pathologic fracture
with central lytic lesion.

Otherwise, this appearance is overall unchanged. Multifocal
lytic/sclerotic metastases in the lower lumbar spine (series 3/image
11), sacrum series 3/image 35), left iliac bone (series 3/image 43),
right femoral head (series 3/image 91), left femoral head/neck
(series 3/image 101), and bilateral inferior pubic
rami/parasymphyseal regions (series 3/image 106).
IMPRESSION: Interval superior endplate compression fracture deformity at L4,
with 20% loss of height centrally, but no retropulsion. This
reflects a pathologic fracture.

Additional multifocal lytic/sclerotic osseous metastases in the
visualized axial and appendicular skeleton, unchanged.

ADDENDUM:
Upon further review, there has been interval development of
significant metastatic disease to the bilateral proximal femurs
compared to the relatively recent prior study dated [DATE]. On
the right, several cortical lucencies are visible in the region of
the subcapital and mid femoral neck highly concerning for pathologic
fracture. Metastatic disease also extends into the femoral head
itself. On the left, and anteriorly located lytic lesion appears to
result in cortical breakthrough along the anterior surface of the
proximal femoral neck (see sagittal images 153 in 154 of series 8).

These findings may represent the primary source of the patient's
progressive bilateral hip pain.

These results will be called to the ordering clinician or
representative by the Radiologist Assistant, and communication
documented in the PACS or [REDACTED].

*** End of Addendum ***
FINDINGS: Urinary Tract:  Bladder is within normal limits.

Bowel:  Visualized bowel is unremarkable.

Vascular/Lymphatic: No evidence of aneurysm.

No suspicious pelvic lymphadenopathy.

Reproductive:  Status post hysterectomy.

Bilateral ovaries are within normal limits.

Other:  No pelvic ascites.

Subcutaneous injection sites along the left lower abdominal wall
(series 6/image 68).

Musculoskeletal: Interval superior endplate compression fracture
deformity at L4 (sagittal image 102), with 20% loss of height
centrally, but no retropulsion. This reflects a pathologic fracture
with central lytic lesion.

Otherwise, this appearance is overall unchanged. Multifocal
lytic/sclerotic metastases in the lower lumbar spine (series 3/image
11), sacrum series 3/image 35), left iliac bone (series 3/image 43),
right femoral head (series 3/image 91), left femoral head/neck
(series 3/image 101), and bilateral inferior pubic
rami/parasymphyseal regions (series 3/image 106).
IMPRESSION: Interval superior endplate compression fracture deformity at L4,
with 20% loss of height centrally, but no retropulsion. This
reflects a pathologic fracture.

Additional multifocal lytic/sclerotic osseous metastases in the
visualized axial and appendicular skeleton, unchanged.

## 2021-01-14 MED ORDER — PROCHLORPERAZINE MALEATE 10 MG PO TABS
10.0000 mg | ORAL_TABLET | Freq: Four times a day (QID) | ORAL | Status: DC | PRN
  Administered 2021-01-15 – 2021-01-21 (×5): 10 mg via ORAL
  Filled 2021-01-14 (×5): qty 1

## 2021-01-14 MED ORDER — ONDANSETRON HCL 4 MG/2ML IJ SOLN
4.0000 mg | Freq: Three times a day (TID) | INTRAMUSCULAR | Status: DC
  Administered 2021-01-14 – 2021-01-21 (×28): 4 mg via INTRAVENOUS
  Filled 2021-01-14 (×28): qty 2

## 2021-01-14 MED ORDER — OXYCODONE HCL 5 MG PO TABS
5.0000 mg | ORAL_TABLET | ORAL | Status: DC | PRN
  Administered 2021-01-14: 5 mg via ORAL
  Filled 2021-01-14: qty 1

## 2021-01-14 MED ORDER — ACETAMINOPHEN 325 MG PO TABS: 650.0000 mg | ORAL_TABLET | Freq: Four times a day (QID) | ORAL | Status: DC | PRN

## 2021-01-14 MED ORDER — SENNOSIDES-DOCUSATE SODIUM 8.6-50 MG PO TABS
1.0000 | ORAL_TABLET | Freq: Every day | ORAL | Status: DC
  Administered 2021-01-14: 1 via ORAL
  Filled 2021-01-14: qty 1

## 2021-01-14 MED ORDER — ACETAMINOPHEN 650 MG RE SUPP: 650.0000 mg | Freq: Four times a day (QID) | RECTAL | Status: DC | PRN

## 2021-01-14 MED ORDER — ENOXAPARIN SODIUM 40 MG/0.4ML IJ SOSY
40.0000 mg | PREFILLED_SYRINGE | INTRAMUSCULAR | Status: DC
  Administered 2021-01-14 – 2021-01-15 (×2): 40 mg via SUBCUTANEOUS
  Filled 2021-01-14 (×2): qty 0.4

## 2021-01-14 MED ORDER — PANTOPRAZOLE SODIUM 40 MG PO TBEC
40.0000 mg | DELAYED_RELEASE_TABLET | Freq: Every day | ORAL | Status: DC
  Administered 2021-01-14 – 2021-01-21 (×8): 40 mg via ORAL
  Filled 2021-01-14 (×7): qty 1

## 2021-01-14 MED ORDER — HYDROMORPHONE HCL 1 MG/ML IJ SOLN
0.5000 mg | INTRAMUSCULAR | Status: DC | PRN
  Administered 2021-01-14 – 2021-01-16 (×16): 0.5 mg via INTRAVENOUS
  Filled 2021-01-14 (×16): qty 0.5

## 2021-01-14 MED ORDER — CAPECITABINE 500 MG PO TABS
1500.0000 mg | ORAL_TABLET | Freq: Two times a day (BID) | ORAL | Status: DC
  Administered 2021-01-14 – 2021-01-19 (×10): 1500 mg via ORAL
  Filled 2021-01-14: qty 3

## 2021-01-14 NOTE — Progress Notes (Signed)
  Echocardiogram 2D Echocardiogram has been performed.  Monica [name] 01/14/2021, 11:56 AM

## 2021-01-14 NOTE — Telephone Encounter (Signed)
This RN was contacted by the patient -" I am here at [facility] waiting to get my ECHO- but my pain level is about at an 8-9 and the tramadol is not helping."  " I am really unable to ambulate well - I did have one good leg I could stand on but now it is too painful to put my full weight when I stand and I cannot put my full weight down "  She states concern due to above.  Per MD review and contact with Hospitalist- pt to be admitted/.  This RN contacted bed control and called 6 E and gave report to nurse.  Pt aware of plan.

## 2021-01-14 NOTE — H&P (Signed)
History and Physical    Raechel Marcos TMH:[id number] DOB: September 04, 1970 DOA: 01/14/2021  PCP: [name], PA-C  Patient coming from: Home.  Chief Complaint: Pain.  HPI: Monica [name] is a 50 y.o. female with history of metastatic breast cancer being followed by Dr. Jana Hakim has been having increasing pain mostly in the hip area making it difficult to ambulate.  Pain is very severe the patient has to use a walker to walk.  Dr. Jana Hakim patient's oncologist had requested admission to pain control.  Denies any nausea vomiting chest pain or shortness of breath.  Patient is due to start Herceptin soon and had a 2D echo done.  On exam patient not in distress.  Has significant pain on moving lower extremities.   ED Course: Patient was a direct admit.  Review of Systems: As per HPI, rest all negative.   Past Medical History:  Diagnosis Date   Arthritis    lower back   Cancer [facility])    right breast cancer   Family history of breast cancer    Personal history of chemotherapy    Personal history of radiation therapy     Past Surgical History:  Procedure Laterality Date   ABDOMINAL HYSTERECTOMY     APPLICATION OF CRANIAL NAVIGATION N/A 12/06/2020   Procedure: APPLICATION OF CRANIAL NAVIGATION;  Surgeon: Judith Part, MD;  Location: [place];  Service: Neurosurgery;  Laterality: N/A;  RM 20   BREAST LUMPECTOMY Right    BREAST LUMPECTOMY WITH RADIOACTIVE SEED AND SENTINEL LYMPH NODE BIOPSY Right 02/16/2018   Procedure: RIGHT BREAST LUMPECTOMY WITH RADIOACTIVE SEED AND SENTINEL LYMPH NODE BIOPSY;  Surgeon: [name], MD;  Location: [place];  Service: General;  Laterality: Right;   CRANIOTOMY Right 12/06/2020   Procedure: Right sided awake craniotomy for tumor resection;  Surgeon: Judith Part, MD;  Location: [place];  Service: Neurosurgery;  Laterality: Right;   HYMENECTOMY     IR US GUIDE BX ASP/DRAIN  12/02/2020   PORTACATH PLACEMENT Right 02/16/2018    Procedure: INSERTION PORT-A-CATH;  Surgeon: [name], MD;  Location: [place];  Service: General;  Laterality: Right;   RE-EXCISION OF BREAST LUMPECTOMY Right 02/22/2018   Procedure: RE-EXCISION OF RIGHT  BREAST LUMPECTOMY;  Surgeon: [name], MD;  Location: [place];  Service: General;  Laterality: Right;   REPAIR VAGINAL CUFF N/A 02/07/2017   Procedure: REPAIR VAGINAL CUFF;  Surgeon: Ena Dawley, MD;  Location: [facility];  Service: Gynecology;  Laterality: N/A;   ROBOTIC ASSISTED TOTAL HYSTERECTOMY WITH SALPINGECTOMY Left 01/20/2017   Procedure: ROBOTIC ASSISTED TOTAL HYSTERECTOMY WITH SALPINGECTOMY;  Surgeon: [name], MD;  Location: [facility];  Service: Gynecology;  Laterality: Left;     reports that she has never smoked. She has never used smokeless tobacco. She reports current alcohol use. She reports that she does not use drugs.  No Known Allergies  Family History  Problem Relation Age of Onset   Lung cancer Maternal Grandfather    Esophageal cancer Paternal Grandfather 61   Breast cancer Cousin 47    Prior to Admission medications   Medication Sig Start Date End Date Taking? Authorizing Provider  acetaminophen (TYLENOL) 500 MG tablet Take 500 mg by mouth every 6 (six) hours as needed for moderate pain.    [provider]  capecitabine (XELODA) 500 MG tablet Take 3 tablets (1,500 mg total) by mouth 2 (two) times daily before a meal. Take for 14 days on, 7  days off. Repeat every 21 days. 01/01/21   Magrinat, [name], MD  cholecalciferol (VITAMIN D3) 25 MCG (1000 UNIT) tablet Take 1 tablet (1,000 Units total) by mouth daily. 06/20/20   Magrinat, [name], MD  dexamethasone (DECADRON) 4 MG tablet Take 0.5 tablets (2 mg total) by mouth 2 (two) times daily with breakfast and lunch. 12/11/20   Judith Part, MD  fluconazole (DIFLUCAN) 100 MG tablet Take 1 tablet (100 mg total) by mouth daily. 01/08/21   Magrinat, [name], MD   lidocaine (LIDODERM) 5 % Place 1 patch onto the skin daily. Remove & Discard patch within 12 hours or as directed by MD 12/10/20   Judith Part, MD  naproxen sodium (ALEVE) 220 MG tablet Take 440 mg by mouth daily as needed (pain).    [provider]  Olopatadine HCl (PATADAY OP) Place 1 drop into both eyes daily.    [provider]  omeprazole (PRILOSEC) 40 MG capsule Take one tablet twice a day for 7 days, then daily 12/31/20   Magrinat, [name], MD  ondansetron (ZOFRAN ODT) 4 MG disintegrating tablet Take 1 tablet (4 mg total) by mouth every 8 (eight) hours as needed for nausea or vomiting. 12/31/20   Magrinat, [name], MD  prochlorperazine (COMPAZINE) 10 MG tablet Take 1 tablet (10 mg total) by mouth every 6 (six) hours as needed (Nausea or vomiting). 12/31/20   Magrinat, [name], MD  promethazine (PHENERGAN) 25 MG suppository Place 1 suppository (25 mg total) rectally every 6 (six) hours as needed for nausea or vomiting. 12/28/20   Barrie Folk, PA-C  traMADol (ULTRAM) 50 MG tablet Take 1-2 tablets (50-100 mg total) by mouth every 6 (six) hours as needed. 01/06/21   Magrinat, [name], MD  tucatinib (TUKYSA) 150 MG tablet Take 1 tablet (150 mg total) by mouth 2 (two) times daily. Take every 12 hrs at the same time each day with or without a meal. 01/01/21   Magrinat, [name], MD  TURMERIC CURCUMIN PO Take 1 tablet by mouth daily.    [provider]    Physical Exam: Constitutional: Moderately built and nourished. Vitals:   01/14/21 1202  BP: (!) 134/92  Pulse: 90  Resp: 18  Temp: 97.8 F (36.6 C)  TempSrc: Oral  SpO2: 97%   Eyes: Anicteric no pallor. ENMT: No discharge from the ears eyes nose and mouth rhythm Neck: No mass felt.  No neck rigidity. Respiratory: No rhonchi or crepitations. Cardiovascular: S1-S2 heard. Abdomen: Soft nontender bowel sound present. Musculoskeletal: No edema. Skin: No rash. Neurologic: Alert awake oriented to  time place and person.  Moves all extremities. Psychiatric: Appears normal.  Normal affect.   Labs on Admission: I have personally reviewed following labs and imaging studies  CBC: Recent Labs  Lab 01/14/21 1255  WBC 10.6*  NEUTROABS 8.3*  HGB 11.9*  HCT 37.3  MCV 94.4  PLT 629   Basic Metabolic Panel: Recent Labs  Lab 01/14/21 1255  NA 137  K 3.6  CL 97*  CO2 29  GLUCOSE 101*  BUN 18  CREATININE 0.84  CALCIUM 10.5*   GFR: Estimated Creatinine Clearance: 84.2 mL/min (by C-G formula based on SCr of 0.84 mg/dL). Liver Function Tests: Recent Labs  Lab 01/14/21 1255  AST 82*  ALT 73*  ALKPHOS 166*  BILITOT 0.8  PROT 7.3  ALBUMIN 3.8   No results for input(s): LIPASE, AMYLASE in the last 168 hours. No results for input(s): AMMONIA  in the last 168 hours. Coagulation Profile: No results for input(s): INR, PROTIME in the last 168 hours. Cardiac Enzymes: No results for input(s): CKTOTAL, CKMB, CKMBINDEX, TROPONINI in the last 168 hours. BNP (last 3 results) No results for input(s): PROBNP in the last 8760 hours. HbA1C: No results for input(s): HGBA1C in the last 72 hours. CBG: No results for input(s): GLUCAP in the last 168 hours. Lipid Profile: No results for input(s): CHOL, HDL, LDLCALC, TRIG, CHOLHDL, LDLDIRECT in the last 72 hours. Thyroid Function Tests: No results for input(s): TSH, T4TOTAL, FREET4, T3FREE, THYROIDAB in the last 72 hours. Anemia Panel: No results for input(s): VITAMINB12, FOLATE, FERRITIN, TIBC, IRON, RETICCTPCT in the last 72 hours. Urine analysis:    Component Value Date/Time   COLORURINE YELLOW 11/30/2020 1315   APPEARANCEUR HAZY (A) 11/30/2020 1315   LABSPEC 1.009 11/30/2020 1315   PHURINE 5.0 11/30/2020 1315   GLUCOSEU NEGATIVE 11/30/2020 1315   [place] 11/30/2020 [place] 11/30/2020 1315   [place] 11/30/2020 1315   PROTEINUR NEGATIVE 11/30/2020 1315   NITRITE NEGATIVE 11/30/2020 1315    LEUKOCYTESUR TRACE (A) 11/30/2020 1315   Sepsis Labs: @LABRCNTIP (procalcitonin:4,lacticidven:4) )No results found for this or any previous visit (from the past 240 hour(s)).   Radiological Exams on Admission: No results found.   Assessment/Plan Principal Problem:   Cancer related pain Active Problems:   Malignant neoplasm of upper-inner quadrant of right breast in female, estrogen receptor positive (HCC)   Bone metastases (HCC)   Pain    Cancer-related pain with history of metastatic breast cancer I will place patient on oxycodone 5 mg p.o. every 4 as needed based on the dose we will see if patient would need long-acting pain medication.  We will keep patient on senna. Metastatic breast cancer being followed by Dr. Jana Hakim. Anemia likely related to metastatic disease.  Follow CBC.   COVID test is pending.   DVT prophylaxis: Lovenox. Code Status: Full code. Family Communication: Patient's mother. Disposition Plan: Home. Consults called: None. Admission status: Observation.  Chief   Rise Patience MD [facility] Pager [phone].  If 7PM-7AM, please contact night-coverage [email or web address] Password [email or web address]  01/14/2021, 2:12 PM

## 2021-01-14 NOTE — Progress Notes (Signed)
Monica [name]   DOB:[birth year]   TK#:[id number]   FTD#:[id number]  Subjective:  Alacia noted increased leg hip pain last night, in the absence of any trauma or unusual activity. Sh doubled the tramadol w/o effect. She came today for echo and got herself through that study (results pending) but felt she could not go home as she lives alone and couldn't ambulate. Hospitalist graciously agreed to a direct admission for main management. She was starte don oxycodone initially w/o relief, with hydromorphone about to be started (nurse in room, also father and sister)   Objective: White woman examined in bed Vitals:   01/14/21 1202  BP: (!) 134/92  Pulse: 90  Resp: 18  Temp: 97.8 F (36.6 C)  SpO2: 97%    Body mass index is 27.75 kg/m. No intake or output data in the 24 hours ending 01/14/21 1747   Sclerae unicteric  Lungs no rales or wheezes--auscultated anterolaterally  Heart regular rate and rhythm  Abdomen soft, +BS  Neuro nonfocal  Breast exam: deferred  CBG (last 3)  No results for input(s): GLUCAP in the last 72 hours.   Labs:  Lab Results  Component Value Date   WBC 10.6 (H) 01/14/2021   HGB 11.9 (L) 01/14/2021   HCT 37.3 01/14/2021   MCV 94.4 01/14/2021   PLT 270 01/14/2021   NEUTROABS 8.3 (H) 01/14/2021    _0 @  Urine Studies No results for input(s): UHGB, CRYS in the last 72 hours.  Invalid input(s): UACOL, UAPR, USPG, UPH, UTP, UGL, UKET, UBIL, UNIT, UROB, ULEU, UEPI, UWBC, URBC, UBAC, CAST, UCOM, BILUA  Basic Metabolic Panel: Recent Labs  Lab 01/14/21 1255  NA 137  K 3.6  CL 97*  CO2 29  GLUCOSE 101*  BUN 18  CREATININE 0.84  CALCIUM 10.5*   GFR Estimated Creatinine Clearance: 84.5 mL/min (by C-G formula based on SCr of 0.84 mg/dL). Liver Function Tests: Recent Labs  Lab 01/14/21 1255  AST 82*  ALT 73*  ALKPHOS 166*  BILITOT 0.8  PROT 7.3  ALBUMIN 3.8   No results for input(s): LIPASE, AMYLASE in the last 168 hours. No results  for input(s): AMMONIA in the last 168 hours. Coagulation profile No results for input(s): INR, PROTIME in the last 168 hours.  CBC: Recent Labs  Lab 01/14/21 1255  WBC 10.6*  NEUTROABS 8.3*  HGB 11.9*  HCT 37.3  MCV 94.4  PLT 270   Cardiac Enzymes: No results for input(s): CKTOTAL, CKMB, CKMBINDEX, TROPONINI in the last 168 hours. BNP: Invalid input(s): POCBNP CBG: No results for input(s): GLUCAP in the last 168 hours. D-Dimer No results for input(s): DDIMER in the last 72 hours. Hgb A1c No results for input(s): HGBA1C in the last 72 hours. Lipid Profile No results for input(s): CHOL, HDL, LDLCALC, TRIG, CHOLHDL, LDLDIRECT in the last 72 hours. Thyroid function studies No results for input(s): TSH, T4TOTAL, T3FREE, THYROIDAB in the last 72 hours.  Invalid input(s): FREET3 Anemia work up No results for input(s): VITAMINB12, FOLATE, FERRITIN, TIBC, IRON, RETICCTPCT in the last 72 hours. Microbiology No results found for this or any previous visit (from the past 240 hour(s)).    Studies:  No results found.  Assessment: 50 y.o.[place], [place] woman status post right breast upper inner quadrant biopsy 12/29/2017, for a clinical T1c N0, stage IA invasive ductal carcinoma, triple positive, with an MIB-1 of 80%.   (1) genetics testing 02/02/2018 though the CancerNext gene panel offered by [facility] showed no deleterious mutations in  APC, ATM, BARD1, BMPR1A,BRCA1, BRCA2, BRIP1, CDH1, CDK4, CDKN2A, CHEK2, DICER1, HOXB13, MLH1, MRE11A, MSH2, MSH6, MUTYH, NBN, NF1, PALB2, PMS2, POLD1, POLE, PTEN, RAD50, RAD51C, RAD51D, SMAD4, SMARCA4, STK11 and TP53 (sequencing and deletion/duplication); EPCAM and GREM1 (deletion/duplication only).             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>A) )    (2) right lumpectomy and sentinel lymph node sampling 02/16/2018 showed a pT2 pN0, stage IB invasive ductal carcinoma, grade 3, with a positive inferior margin;  a total of 5 lymph nodes were removed             (a) additional surgery 02/27/2018 cleared the margins   (3) started carboplatin, docetaxel, trastuzumab and pertuzumab 03/11/2018, repeated every 21 days x 6, last dose 07/18/2018.               (a) Pertuzumab omitted after cycle 1 due to diarrhea.             (b) Gemcitabine substituted for docetaxel starting with cycle 5 due to peripheral neuropathy   (4) continued trastuzumab to total 6 months (through February 2020).             (a) echocardiogram 01/21/2018 showed an ejection fraction in the 55-60% range             (b) repeat echocardiogram 06/14/2018 showed an ejection fraction in the 60-65%   (5) adjuvant radiation 08/15/2018 - 09/28/2018  Site/dose:   The patient initially received a dose of 50.4 Gy in 28 fractions to the right breast using whole-breast tangent fields. This was delivered using a 3-D conformal technique. The patient then received a boost to the seroma. This delivered an additional 10 Gy in 5 fractions using 12E, 9E electrons with a special teletherapy technique. The total dose was 60.4 Gy.    (6) started tamoxifen March 2020             (a) the patient is status post hysterectomy, wihtout bilateral salpingo-oophorectomy             (b) [place] and estradiol levels June 2020 show she is pre menopausaul             (c) will recheck 06/2020   METASTATIC DISEASE: May 2022, involving brain, liver, lungs, and bones (7) presenting 11/29/2020 with progressive back pain:             (a) non-contrast CT abd/pelvis (renal stone study) 11/29/2020 shows bone metastases             (b) CT chest/abd/pelvis with contrast 11/30/2020 shows multiple large liver masses, multiple pulmonary nodules, and widespread lytic bone lesions             (c) MRI brain shows at least 4 brain lesions, largest 3.8 cm             (d) total spinal MRI 11/30/2020 shows pathologic fractures at T9, T12, possibly L2, as well as numerous other spine lesions, but  no epidural enhancement or cord compression             (e) liver biopsy 12/02/2020 shows adenocarcinoma, prognostic panel again triple positive             (f) pre-op repeat MRI 12/01/2020 shows additional brain lesions, at least 8 of which will be targets for SRS   (8) CNS treatment:             (a) [place] 12/05/2020             (  b) surgery 12/06/2020 confirmed metastatic carcinoma, estrogen receptor positive, HER2 amplified, progesterone receptor negative, with an MIB-1 of 70%.   (9) XRT to spine 12/05/2020 through 12/23/2020 Site Technique Total Dose (Gy) Dose per Fx (Gy) Completed Fx Beam Energies  Thoracic Spine: Spine_T9-L3 Complex 30/30 3 10/10 15X  Pelvis: Pelvis_sacrum Complex 30/30 3 10/10 15X    (10) advanced directives-- plans to name son as HCPOA with her father as secondary   (37) started capecitabine 01/08/2021, to start trastuzumab 01/22/2021, tucatinib 01/29/2021  (A) echo 01/14/2021   (12) to start denosumab/Xgeva 01/22/2021, repeated every 6 weeks     Plan: I appreciate Dr Hal Hope arranging for direct admission for this very stoical patient who developed rapidly worsening pain in the last 18 hours, to the point she could not ambulate. She was admitted for pain control. On a prior admission we were not able to ding a narcotic she could tolerate--in addition to nausea and vomiting she had mild hallucinations. I asm hopeful we may be able to do better this time as she has had at least some tramadol in the past and been able to tolerate that.  I am adding RTC zofran to her antiemetic orders and writing for a non-contrast pelvic CT to see if we can identify the specific reason her pain worsened so suddenly. Agree with continuing her capecitabine (she will take own supply)  She is being evaluated for outpatient kyphoplasty by radiology.  She was supposed to have her first Herceptin tomorrow but I will move that back a week.  Will follow with you   Chauncey Cruel,  MD 01/14/2021  5:47 PM Medical Oncology and Hematology [facility] [address] [place], [place] [address] Tel. [phone]    Fax. [phone]

## 2021-01-15 ENCOUNTER — Inpatient Hospital Stay: Payer: 59

## 2021-01-15 ENCOUNTER — Inpatient Hospital Stay: Payer: 59 | Admitting: Oncology

## 2021-01-15 ENCOUNTER — Telehealth: Payer: Self-pay | Admitting: Oncology

## 2021-01-15 DIAGNOSIS — Z90710 Acquired absence of both cervix and uterus: Secondary | ICD-10-CM | POA: Diagnosis not present

## 2021-01-15 DIAGNOSIS — Z800 Family history of malignant neoplasm of digestive organs: Secondary | ICD-10-CM | POA: Diagnosis not present

## 2021-01-15 DIAGNOSIS — E876 Hypokalemia: Secondary | ICD-10-CM | POA: Diagnosis present

## 2021-01-15 DIAGNOSIS — Z803 Family history of malignant neoplasm of breast: Secondary | ICD-10-CM | POA: Diagnosis not present

## 2021-01-15 DIAGNOSIS — Z170 Estrogen receptor positive status [ER+]: Secondary | ICD-10-CM

## 2021-01-15 DIAGNOSIS — C7931 Secondary malignant neoplasm of brain: Secondary | ICD-10-CM | POA: Diagnosis present

## 2021-01-15 DIAGNOSIS — Z923 Personal history of irradiation: Secondary | ICD-10-CM | POA: Diagnosis not present

## 2021-01-15 DIAGNOSIS — Z20822 Contact with and (suspected) exposure to covid-19: Secondary | ICD-10-CM | POA: Diagnosis present

## 2021-01-15 DIAGNOSIS — G893 Neoplasm related pain (acute) (chronic): Secondary | ICD-10-CM | POA: Diagnosis present

## 2021-01-15 DIAGNOSIS — C7951 Secondary malignant neoplasm of bone: Secondary | ICD-10-CM | POA: Diagnosis present

## 2021-01-15 DIAGNOSIS — C787 Secondary malignant neoplasm of liver and intrahepatic bile duct: Secondary | ICD-10-CM | POA: Diagnosis present

## 2021-01-15 DIAGNOSIS — Z7952 Long term (current) use of systemic steroids: Secondary | ICD-10-CM | POA: Diagnosis not present

## 2021-01-15 DIAGNOSIS — Z9221 Personal history of antineoplastic chemotherapy: Secondary | ICD-10-CM | POA: Diagnosis not present

## 2021-01-15 DIAGNOSIS — C7802 Secondary malignant neoplasm of left lung: Secondary | ICD-10-CM | POA: Diagnosis present

## 2021-01-15 DIAGNOSIS — Z79899 Other long term (current) drug therapy: Secondary | ICD-10-CM | POA: Diagnosis not present

## 2021-01-15 DIAGNOSIS — C50211 Malignant neoplasm of upper-inner quadrant of right female breast: Secondary | ICD-10-CM

## 2021-01-15 DIAGNOSIS — Z801 Family history of malignant neoplasm of trachea, bronchus and lung: Secondary | ICD-10-CM | POA: Diagnosis not present

## 2021-01-15 DIAGNOSIS — D649 Anemia, unspecified: Secondary | ICD-10-CM | POA: Diagnosis not present

## 2021-01-15 DIAGNOSIS — K5900 Constipation, unspecified: Secondary | ICD-10-CM | POA: Diagnosis present

## 2021-01-15 DIAGNOSIS — M8448XA Pathological fracture, other site, initial encounter for fracture: Secondary | ICD-10-CM | POA: Diagnosis present

## 2021-01-15 DIAGNOSIS — C7801 Secondary malignant neoplasm of right lung: Secondary | ICD-10-CM | POA: Diagnosis present

## 2021-01-15 DIAGNOSIS — D630 Anemia in neoplastic disease: Secondary | ICD-10-CM | POA: Diagnosis present

## 2021-01-15 LAB — SARS CORONAVIRUS 2 (TAT 6-24 HRS): SARS Coronavirus 2: NEGATIVE

## 2021-01-15 MED ORDER — IBUPROFEN 200 MG PO TABS
600.0000 mg | ORAL_TABLET | Freq: Three times a day (TID) | ORAL | Status: DC
  Administered 2021-01-15 – 2021-01-17 (×7): 600 mg via ORAL
  Filled 2021-01-15 (×7): qty 3

## 2021-01-15 MED ORDER — SENNOSIDES-DOCUSATE SODIUM 8.6-50 MG PO TABS
1.0000 | ORAL_TABLET | Freq: Two times a day (BID) | ORAL | Status: DC
  Administered 2021-01-15 – 2021-01-17 (×4): 1 via ORAL
  Filled 2021-01-15 (×4): qty 1

## 2021-01-15 MED ORDER — POLYETHYLENE GLYCOL 3350 17 G PO PACK
17.0000 g | PACK | Freq: Two times a day (BID) | ORAL | Status: DC
  Administered 2021-01-15 – 2021-01-18 (×6): 17 g via ORAL
  Filled 2021-01-15 (×8): qty 1

## 2021-01-15 NOTE — Telephone Encounter (Signed)
Scheduled appts per 7/6 sch msg. Pt aware.  

## 2021-01-15 NOTE — Progress Notes (Signed)
Monica [name]   DOB:June 19, 1971   NU#:[id number]   IHK#:[id number]  Subjective:  Monica [name] 's pain is a bit better on her current meds. No Bm yet. Father and mother in room  Objective: White woman examined in bed Vitals:   01/15/21 0525 01/15/21 1606  BP: 127/84 (!) 141/96  Pulse: 86 85  Resp: 16 18  Temp: 98.1 F (36.7 C) 98.7 F (37.1 C)  SpO2: 98% 97%    Body mass index is 27.75 kg/m.  Intake/Output Summary (Last 24 hours) at 01/15/2021 1857 Last data filed at 01/15/2021 1837 Gross per 24 hour  Intake 480 ml  Output --  Net 480 ml      CBG (last 3)  No results for input(s): GLUCAP in the last 72 hours.   Labs:  Lab Results  Component Value Date   WBC 10.6 (H) 01/14/2021   HGB 11.9 (L) 01/14/2021   HCT 37.3 01/14/2021   MCV 94.4 01/14/2021   PLT 270 01/14/2021   NEUTROABS 8.3 (H) 01/14/2021    '@LASTCHEMISTRY' @  Urine Studies No results for input(s): UHGB, CRYS in the last 72 hours.  Invalid input(s): UACOL, UAPR, USPG, UPH, UTP, UGL, UKET, UBIL, UNIT, UROB, ULEU, UEPI, UWBC, URBC, UBAC, CAST, UCOM, BILUA  Basic Metabolic Panel: Recent Labs  Lab 01/14/21 1255  NA 137  K 3.6  CL 97*  CO2 29  GLUCOSE 101*  BUN 18  CREATININE 0.84  CALCIUM 10.5*   GFR Estimated Creatinine Clearance: 84.5 mL/min (by C-G formula based on SCr of 0.84 mg/dL). Liver Function Tests: Recent Labs  Lab 01/14/21 1255  AST 82*  ALT 73*  ALKPHOS 166*  BILITOT 0.8  PROT 7.3  ALBUMIN 3.8   No results for input(s): LIPASE, AMYLASE in the last 168 hours. No results for input(s): AMMONIA in the last 168 hours. Coagulation profile No results for input(s): INR, PROTIME in the last 168 hours.  CBC: Recent Labs  Lab 01/14/21 1255  WBC 10.6*  NEUTROABS 8.3*  HGB 11.9*  HCT 37.3  MCV 94.4  PLT 270   Cardiac Enzymes: No results for input(s): CKTOTAL, CKMB, CKMBINDEX, TROPONINI in the last 168 hours. BNP: Invalid input(s): POCBNP CBG: No results for input(s): GLUCAP in the  last 168 hours. D-Dimer No results for input(s): DDIMER in the last 72 hours. Hgb A1c No results for input(s): HGBA1C in the last 72 hours. Lipid Profile No results for input(s): CHOL, HDL, LDLCALC, TRIG, CHOLHDL, LDLDIRECT in the last 72 hours. Thyroid function studies No results for input(s): TSH, T4TOTAL, T3FREE, THYROIDAB in the last 72 hours.  Invalid input(s): FREET3 Anemia work up No results for input(s): VITAMINB12, FOLATE, FERRITIN, TIBC, IRON, RETICCTPCT in the last 72 hours. Microbiology Recent Results (from the past 240 hour(s))  SARS CORONAVIRUS 2 (TAT 6-24 HRS) Nasopharyngeal Nasopharyngeal Swab     Status: None   Collection Time: 01/14/21  4:43 PM   Specimen: Nasopharyngeal Swab  Result Value Ref Range Status   SARS Coronavirus 2 NEGATIVE NEGATIVE Final    Comment: (NOTE) SARS-CoV-2 target nucleic acids are NOT DETECTED.  The SARS-CoV-2 RNA is generally detectable in upper and lower respiratory specimens during the acute phase of infection. Negative results do not preclude SARS-CoV-2 infection, do not rule out co-infections with other pathogens, and should not be used as the sole basis for treatment or other patient management decisions. Negative results must be combined with clinical observations, patient history, and epidemiological information. The expected result is Negative.  Fact Sheet for  Patients: SugarRoll.be  Fact Sheet for Healthcare Providers: https://www.woods-mathews.com/  This test is not yet approved or cleared by the [place] FDA and  has been authorized for detection and/or diagnosis of SARS-CoV-2 by FDA under an Emergency Use Authorization (EUA). This EUA will remain  in effect (meaning this test can be used) for the duration of the COVID-19 declaration under Se ction 564(b)(1) of the Act, 21 U.S.C. section 360bbb-3(b)(1), unless the authorization is terminated or revoked sooner.  Performed at  [facility], [place] [address] [place], [place] [address]       Studies:  CT PELVIS WO CONTRAST  Result Date: 01/14/2021 CLINICAL DATA:  Breast cancer, pelvic pain, known osseous metastases EXAM: CT PELVIS WITHOUT CONTRAST TECHNIQUE: Multidetector CT imaging of the pelvis was performed following the standard protocol without intravenous contrast. COMPARISON:  CT abdomen/pelvis dated 11/30/2020 FINDINGS: Urinary Tract:  Bladder is within normal limits. Bowel:  Visualized bowel is unremarkable. Vascular/Lymphatic: No evidence of aneurysm. No suspicious pelvic lymphadenopathy. Reproductive:  Status post hysterectomy. Bilateral ovaries are within normal limits. Other:  No pelvic ascites. Subcutaneous injection sites along the left lower abdominal wall (series 6/image 68). Musculoskeletal: Interval superior endplate compression fracture deformity at L4 (sagittal image 102), with 20% loss of height centrally, but no retropulsion. This reflects a pathologic fracture with central lytic lesion. Otherwise, this appearance is overall unchanged. Multifocal lytic/sclerotic metastases in the lower lumbar spine (series 3/image 11), sacrum series 3/image 35), left iliac bone (series 3/image 43), right femoral head (series 3/image 91), left femoral head/neck (series 3/image 101), and bilateral inferior pubic rami/parasymphyseal regions (series 3/image 106). IMPRESSION: Interval superior endplate compression fracture deformity at L4, with 20% loss of height centrally, but no retropulsion. This reflects a pathologic fracture. Additional multifocal lytic/sclerotic osseous metastases in the visualized axial and appendicular skeleton, unchanged. Electronically Signed   By: Julian Hy M.D.   On: 01/14/2021 19:59   ECHOCARDIOGRAM COMPLETE  Result Date: 01/14/2021    ECHOCARDIOGRAM REPORT   Patient Name:   [name] Date of Exam: 01/14/2021 Medical Rec #:  [id number]     Height:       66.0 in Accession #:     [id number]    Weight:       171.0 lb Date of Birth:  02-10-1971     BSA:          1.871 m Patient Age:    50 years      BP:           113/72 mmHg Patient Gender: F             HR:           94 bpm. Exam Location:  Outpatient Procedure: 2D Echo, Cardiac Doppler, Color Doppler and Strain Analysis Indications:    Chemo evaluation  History:        Patient has prior history of Echocardiogram examinations, most                 recent 06/14/2018. Signs/Symptoms:Breast cancer.  Sonographer:    Dustin Flock Referring Phys: [place]  1. Left ventricular ejection fraction, by estimation, is 60 to 65%. The left ventricle has normal function. The left ventricle has no regional wall motion abnormalities. Left ventricular diastolic parameters were normal.  2. Right ventricular systolic function is normal. The right ventricular size is normal. Tricuspid regurgitation signal is inadequate for assessing PA pressure.  3. The mitral valve is grossly normal.  No evidence of mitral valve regurgitation. No evidence of mitral stenosis.  4. The aortic valve is tricuspid. Aortic valve regurgitation is not visualized. No aortic stenosis is present.  5. The inferior vena cava is normal in size with greater than 50% respiratory variability, suggesting right atrial pressure of 3 mmHg. Conclusion(s)/Recommendation(s): Normal biventricular function without evidence of hemodynamically significant valvular heart disease. FINDINGS  Left Ventricle: Left ventricular ejection fraction, by estimation, is 60 to 65%. The left ventricle has normal function. The left ventricle has no regional wall motion abnormalities. Global longitudinal strain performed but not reported based on interpreter judgement due to suboptimal tracking. The left ventricular internal cavity size was normal in size. There is no left ventricular hypertrophy. Left ventricular diastolic parameters were normal. Right Ventricle: The right ventricular size is  normal. No increase in right ventricular wall thickness. Right ventricular systolic function is normal. Tricuspid regurgitation signal is inadequate for assessing PA pressure. Left Atrium: Left atrial size was normal in size. Right Atrium: Right atrial size was normal in size. Pericardium: Trivial pericardial effusion is present. Presence of pericardial fat pad. Mitral Valve: The mitral valve is grossly normal. No evidence of mitral valve regurgitation. No evidence of mitral valve stenosis. Tricuspid Valve: The tricuspid valve is grossly normal. Tricuspid valve regurgitation is trivial. No evidence of tricuspid stenosis. Aortic Valve: The aortic valve is tricuspid. Aortic valve regurgitation is not visualized. No aortic stenosis is present. Pulmonic Valve: The pulmonic valve was grossly normal. Pulmonic valve regurgitation is not visualized. No evidence of pulmonic stenosis. Aorta: The aortic root and ascending aorta are structurally normal, with no evidence of dilitation. Venous: The right lower pulmonary vein is normal. The inferior vena cava is normal in size with greater than 50% respiratory variability, suggesting right atrial pressure of 3 mmHg. IAS/Shunts: The atrial septum is grossly normal.  LEFT VENTRICLE PLAX 2D LVIDd:         4.00 cm     Diastology LVIDs:         2.50 cm     LV e' medial:    5.00 cm/s LV PW:         0.90 cm     LV E/e' medial:  15.9 LV IVS:        1.10 cm     LV e' lateral:   6.64 cm/s LVOT diam:     2.00 cm     LV E/e' lateral: 12.0 LV SV:         65 LV SV Index:   35 LVOT Area:     3.14 cm  LV Volumes (MOD) LV vol d, MOD A4C: 72.7 ml LV vol s, MOD A4C: 29.8 ml LV SV MOD A4C:     72.7 ml RIGHT VENTRICLE RV Basal diam:  2.60 cm RV S prime:     14.60 cm/s TAPSE (M-mode): 2.2 cm LEFT ATRIUM             Index       RIGHT ATRIUM           Index LA diam:        2.40 cm 1.28 cm/m  RA Area:     11.40 cm LA Vol (A2C):   27.8 ml 14.86 ml/m RA Volume:   23.30 ml  12.45 ml/m LA Vol (A4C):    30.2 ml 16.14 ml/m LA Biplane Vol: 29.2 ml 15.61 ml/m  AORTIC VALVE LVOT Vmax:   110.00 cm/s LVOT Vmean:  72.300 cm/s LVOT VTI:  0.208 m  AORTA Ao Root diam: 2.80 cm MITRAL VALVE MV Area (PHT): 3.68 cm    SHUNTS MV Decel Time: 206 msec    Systemic VTI:  0.21 m MV E velocity: 79.50 cm/s  Systemic Diam: 2.00 cm MV A velocity: 69.40 cm/s MV E/A ratio:  1.15 Eleonore Chiquito MD Electronically signed by Eleonore Chiquito MD Signature Date/Time: 01/14/2021/7:04:07 PM    Final     Assessment: 50 y.o., [place] woman status post right breast upper inner quadrant biopsy 12/29/2017, for a clinical T1c N0, stage IA invasive ductal carcinoma, triple positive, with an MIB-1 of 80%.   (1) genetics testing 02/02/2018 though the CancerNext gene panel offered by [facility] showed no deleterious mutations in  APC, ATM, BARD1, BMPR1A,BRCA1, BRCA2, BRIP1, CDH1, CDK4, CDKN2A, CHEK2, DICER1, HOXB13, MLH1, MRE11A, MSH2, MSH6, MUTYH, NBN, NF1, PALB2, PMS2, POLD1, POLE, PTEN, RAD50, RAD51C, RAD51D, SMAD4, SMARCA4, STK11 and TP53 (sequencing and deletion/duplication); EPCAM and GREM1 (deletion/duplication only).             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               (a) Pertuzumab omitted after cycle 1 due to diarrhea.             (b) Gemcitabine substituted for docetaxel starting with cycle 5 due to peripheral neuropathy   (4) continued trastuzumab to total 6 months (through February 2020).             (a) echocardiogram 01/21/2018 showed an ejection fraction in the 55-60% range              (b) repeat echocardiogram 06/14/2018 showed an ejection fraction in the 60-65%   (5) adjuvant radiation 08/15/2018 - 09/28/2018  Site/dose:   The patient initially received a dose of 50.4 Gy in 28 fractions to the right breast using whole-breast tangent fields. This was delivered using a 3-D conformal technique. The patient then received a boost to the seroma. This delivered an additional 10 Gy in 5 fractions using 12E, 9E electrons with a special teletherapy technique. The total dose was 60.4 Gy.    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, involving brain, liver, lungs, and bones (7) presenting 11/29/2020 with progressive back pain:             (a) non-contrast CT abd/pelvis (renal stone study) 11/29/2020 shows bone metastases             (b) CT chest/abd/pelvis with contrast 11/30/2020 shows multiple large liver masses, multiple pulmonary nodules, and widespread lytic bone lesions             (  c) MRI brain shows at least 4 brain lesions, largest 3.8 cm             (d) total spinal MRI 11/30/2020 shows pathologic fractures at T9, T12, possibly L2, as well as numerous other spine lesions, but no epidural enhancement or cord compression             (e) liver biopsy 12/02/2020 shows adenocarcinoma, prognostic panel again triple positive             (f) pre-op repeat MRI 12/01/2020 shows additional brain lesions, at least 8 of which will be targets for SRS   (8) CNS treatment:             (a) [place] 12/05/2020             (b) surgery 12/06/2020 confirmed metastatic carcinoma, estrogen receptor positive, HER2 amplified, progesterone receptor negative, with an MIB-1 of 70%.   (9) XRT to spine 12/05/2020 through 12/23/2020 Site Technique Total Dose (Gy) Dose per Fx  (Gy) Completed Fx Beam Energies  Thoracic Spine: Spine_T9-L3 Complex 30/30 3 10/10 15X  Pelvis: Pelvis_sacrum Complex 30/30 3 10/10 15X    (10) advanced directives-- plans to name son as HCPOA with her father as secondary   (11) started capecitabine 01/08/2021, to start trastuzumab 01/22/2021, tucatinib 01/29/2021  (A) echo 01/14/2021   (12) to start denosumab/Xgeva 01/22/2021, repeated every 6 weeks     Plan: Non-contrast CT yesterday shows new L4 compression fracture, likely the source of her pain exacerbation. Very likely she will obtain significant relief from kyphoplasty-- I am entering the irder for IR to cinsider; will holw lovenox pending scheduling the procedure. Discussed with the patient and family  She lives in a second floor no elevator apartment. It will be difficult to discharge her until we have the kyphoplasty done  Will follow with you   Chauncey Cruel, MD 01/15/2021  6:57 PM Medical Oncology and Hematology [facility] [place], [place] [address] Tel. [phone]    Fax. [phone]

## 2021-01-15 NOTE — Progress Notes (Signed)
PROGRESS NOTE    Monica [name]  CHY:[id number] DOB: Jul 12, 1971 DOA: 01/14/2021 PCP: [name], PA-C    No chief complaint on file.   Brief Narrative:  Patient 50 year old female history of metastatic breast cancer being followed by Dr. Jana Hakim presented with increasing pain mostly in the hips and lower extremities with difficulty ambulating.  Pain very severe that patient had to use a walker.  Patient admitted for pain control.  Patient also due to start Herceptin soon and 2D echo ordered.   Assessment & Plan:   Principal Problem:   Cancer related pain Active Problems:   Malignant neoplasm of upper-inner quadrant of right breast in female, estrogen receptor positive (HCC)   Bone metastases (HCC)   Pain   #1 cancer related pain secondary to history of widely metastatic breast cancer. -Patient with significant lower extremity pain leading to difficulty ambulating.  Patient lives on the second level with no elevator at home and will be unsafe to discharge with difficulty ambulating and pain uncontrolled. -CT pelvis done with new interval superior endplate compression fracture deformity at L4 with 20% loss of height centrally, no retropulsion, this reflects pathologic fracture.  Additional multifocal lytic/sclerotic osseous metastases in the visualized axial and appendicular skeleton, unchanged. -Patient still requiring IV Dilaudid pain medication every 3-4 hours per MAR. -Continue as needed oxycodone. -Place on scheduled ibuprofen 600 mg 3 times daily. -Oncology following and order placed for IR to assess and consider for kyphoplasty as it is felt this may help improve patient's pain management. -Consult with palliative care for pain management. -PT/OT -Oncology following.  2.  Metastatic breast cancer -2D echo with normal EF,NWMA -Per oncology.  3.  Anemia -Likely anemia of chronic disease. -Patient with no overt bleeding. -Check an anemia panel. -Follow  H&H. -Transfusion threshold hemoglobin < 7.    DVT prophylaxis: SCDs Code Status: Full Family Communication: Updated patient.  No family at bedside. Disposition:   Status is: Inpatient  The patient will require care spanning > 2 midnights and should be moved to inpatient because: Ongoing active pain requiring inpatient pain management  Dispo: The patient is from: Home              Anticipated d/c is to:  TBD              Patient currently is not medically stable to d/c.   Difficult to place patient No       Consultants:  Oncology: Dr. Jana Hakim.  Procedures:  CT abdomen and pelvis 01/14/2021 2D echo 01/14/2021  Antimicrobials:  None   Subjective: Patient laying in bed.  States no significant change with pain in bilateral lower extremities which has significantly affected her ability to ambulate.  Patient states significant pain when trying to ambulate to the bathroom.  No chest pain.  No shortness of breath.  Complain of pain right outer thigh radiating down to right knee and inner left groin pain.  Complain of some weakness in right lower extremity.  Objective: Vitals:   01/14/21 1740 01/14/21 2018 01/15/21 0007 01/15/21 0525  BP:  113/72 129/86 127/84  Pulse:  92 75 86  Resp:  18 16 16   Temp:  97.8 F (36.6 C) 98.1 F (36.7 C) 98.1 F (36.7 C)  TempSrc:  Oral Oral Oral  SpO2:  97% 98% 98%  Weight: 78 kg     Height:       No intake or output data in the 24 hours ending 01/15/21 [place]  01/14/21 1740  Weight: 78 kg    Examination:  General exam: Appears calm and comfortable  Respiratory system: Clear to auscultation. Respiratory effort normal. Cardiovascular system: S1 & S2 heard, RRR. No JVD, murmurs, rubs, gallops or clicks. No pedal edema. Gastrointestinal system: Abdomen is nondistended, soft and nontender. No organomegaly or masses felt. Normal bowel sounds heard. Central nervous system: Alert and oriented. No focal neurological  deficits. Extremities: Symmetric 5 x 5 power. Skin: No rashes, lesions or ulcers Psychiatry: Judgement and insight appear normal. Mood & affect appropriate.     Data Reviewed: I have personally reviewed following labs and imaging studies  CBC: Recent Labs  Lab 01/14/21 1255  WBC 10.6*  NEUTROABS 8.3*  HGB 11.9*  HCT 37.3  MCV 94.4  PLT 099    Basic Metabolic Panel: Recent Labs  Lab 01/14/21 1255  NA 137  K 3.6  CL 97*  CO2 29  GLUCOSE 101*  BUN 18  CREATININE 0.84  CALCIUM 10.5*    GFR: Estimated Creatinine Clearance: 84.5 mL/min (by C-G formula based on SCr of 0.84 mg/dL).  Liver Function Tests: Recent Labs  Lab 01/14/21 1255  AST 82*  ALT 73*  ALKPHOS 166*  BILITOT 0.8  PROT 7.3  ALBUMIN 3.8    CBG: No results for input(s): GLUCAP in the last 168 hours.   Recent Results (from the past 240 hour(s))  SARS CORONAVIRUS 2 (TAT 6-24 HRS) Nasopharyngeal Nasopharyngeal Swab     Status: None   Collection Time: 01/14/21  4:43 PM   Specimen: Nasopharyngeal Swab  Result Value Ref Range Status   SARS Coronavirus 2 NEGATIVE NEGATIVE Final    Comment: (NOTE) SARS-CoV-2 target nucleic acids are NOT DETECTED.  The SARS-CoV-2 RNA is generally detectable in upper and lower respiratory specimens during the acute phase of infection. Negative results do not preclude SARS-CoV-2 infection, do not rule out co-infections with other pathogens, and should not be used as the sole basis for treatment or other patient management decisions. Negative results must be combined with clinical observations, patient history, and epidemiological information. The expected result is Negative.  Fact Sheet for Patients: SugarRoll.be  Fact Sheet for Healthcare Providers: https://www.woods-mathews.com/  This test is not yet approved or cleared by the [place] FDA and  has been authorized for detection and/or diagnosis of SARS-CoV-2  by FDA under an Emergency Use Authorization (EUA). This EUA will remain  in effect (meaning this test can be used) for the duration of the COVID-19 declaration under Se ction 564(b)(1) of the Act, 21 U.S.C. section 360bbb-3(b)(1), unless the authorization is terminated or revoked sooner.  Performed at [facility], [place] [address] [place], [place] [address]          Radiology Studies: CT PELVIS WO CONTRAST  Result Date: 01/14/2021 CLINICAL DATA:  Breast cancer, pelvic pain, known osseous metastases EXAM: CT PELVIS WITHOUT CONTRAST TECHNIQUE: Multidetector CT imaging of the pelvis was performed following the standard protocol without intravenous contrast. COMPARISON:  CT abdomen/pelvis dated 11/30/2020 FINDINGS: Urinary Tract:  Bladder is within normal limits. Bowel:  Visualized bowel is unremarkable. Vascular/Lymphatic: No evidence of aneurysm. No suspicious pelvic lymphadenopathy. Reproductive:  Status post hysterectomy. Bilateral ovaries are within normal limits. Other:  No pelvic ascites. Subcutaneous injection sites along the left lower abdominal wall (series 6/image 68). Musculoskeletal: Interval superior endplate compression fracture deformity at L4 (sagittal image 102), with 20% loss of height centrally, but no retropulsion. This reflects a pathologic fracture with central lytic lesion.  Otherwise, this appearance is overall unchanged. Multifocal lytic/sclerotic metastases in the lower lumbar spine (series 3/image 11), sacrum series 3/image 35), left iliac bone (series 3/image 43), right femoral head (series 3/image 91), left femoral head/neck (series 3/image 101), and bilateral inferior pubic rami/parasymphyseal regions (series 3/image 106). IMPRESSION: Interval superior endplate compression fracture deformity at L4, with 20% loss of height centrally, but no retropulsion. This reflects a pathologic fracture. Additional multifocal lytic/sclerotic osseous metastases in the visualized  axial and appendicular skeleton, unchanged. Electronically Signed   By: Julian Hy M.D.   On: 01/14/2021 19:59   ECHOCARDIOGRAM COMPLETE  Result Date: 01/14/2021    ECHOCARDIOGRAM REPORT   Patient Name:   [facility] Date of Exam: 01/14/2021 Medical Rec #:  [id number]     Height:       66.0 in Accession #:    [id number]    Weight:       171.0 lb Date of Birth:  14-Mar-1971     BSA:          1.871 m Patient Age:    64 years      BP:           113/72 mmHg Patient Gender: F             HR:           94 bpm. Exam Location:  Outpatient Procedure: 2D Echo, Cardiac Doppler, Color Doppler and Strain Analysis Indications:    Chemo evaluation  History:        Patient has prior history of Echocardiogram examinations, most                 recent 06/14/2018. Signs/Symptoms:Breast cancer.  Sonographer:    Dustin Flock Referring Phys: [place]  1. Left ventricular ejection fraction, by estimation, is 60 to 65%. The left ventricle has normal function. The left ventricle has no regional wall motion abnormalities. Left ventricular diastolic parameters were normal.  2. Right ventricular systolic function is normal. The right ventricular size is normal. Tricuspid regurgitation signal is inadequate for assessing PA pressure.  3. The mitral valve is grossly normal. No evidence of mitral valve regurgitation. No evidence of mitral stenosis.  4. The aortic valve is tricuspid. Aortic valve regurgitation is not visualized. No aortic stenosis is present.  5. The inferior vena cava is normal in size with greater than 50% respiratory variability, suggesting right atrial pressure of 3 mmHg. Conclusion(s)/Recommendation(s): Normal biventricular function without evidence of hemodynamically significant valvular heart disease. FINDINGS  Left Ventricle: Left ventricular ejection fraction, by estimation, is 60 to 65%. The left ventricle has normal function. The left ventricle has no regional wall motion  abnormalities. Global longitudinal strain performed but not reported based on interpreter judgement due to suboptimal tracking. The left ventricular internal cavity size was normal in size. There is no left ventricular hypertrophy. Left ventricular diastolic parameters were normal. Right Ventricle: The right ventricular size is normal. No increase in right ventricular wall thickness. Right ventricular systolic function is normal. Tricuspid regurgitation signal is inadequate for assessing PA pressure. Left Atrium: Left atrial size was normal in size. Right Atrium: Right atrial size was normal in size. Pericardium: Trivial pericardial effusion is present. Presence of pericardial fat pad. Mitral Valve: The mitral valve is grossly normal. No evidence of mitral valve regurgitation. No evidence of mitral valve stenosis. Tricuspid Valve: The tricuspid valve is grossly normal. Tricuspid valve regurgitation is trivial. No evidence of tricuspid stenosis. Aortic Valve: The  aortic valve is tricuspid. Aortic valve regurgitation is not visualized. No aortic stenosis is present. Pulmonic Valve: The pulmonic valve was grossly normal. Pulmonic valve regurgitation is not visualized. No evidence of pulmonic stenosis. Aorta: The aortic root and ascending aorta are structurally normal, with no evidence of dilitation. Venous: The right lower pulmonary vein is normal. The inferior vena cava is normal in size with greater than 50% respiratory variability, suggesting right atrial pressure of 3 mmHg. IAS/Shunts: The atrial septum is grossly normal.  LEFT VENTRICLE PLAX 2D LVIDd:         4.00 cm     Diastology LVIDs:         2.50 cm     LV e' medial:    5.00 cm/s LV PW:         0.90 cm     LV E/e' medial:  15.9 LV IVS:        1.10 cm     LV e' lateral:   6.64 cm/s LVOT diam:     2.00 cm     LV E/e' lateral: 12.0 LV SV:         65 LV SV Index:   35 LVOT Area:     3.14 cm  LV Volumes (MOD) LV vol d, MOD A4C: 72.7 ml LV vol s, MOD A4C: 29.8 ml  LV SV MOD A4C:     72.7 ml RIGHT VENTRICLE RV Basal diam:  2.60 cm RV S prime:     14.60 cm/s TAPSE (M-mode): 2.2 cm LEFT ATRIUM             Index       RIGHT ATRIUM           Index LA diam:        2.40 cm 1.28 cm/m  RA Area:     11.40 cm LA Vol (A2C):   27.8 ml 14.86 ml/m RA Volume:   23.30 ml  12.45 ml/m LA Vol (A4C):   30.2 ml 16.14 ml/m LA Biplane Vol: 29.2 ml 15.61 ml/m  AORTIC VALVE LVOT Vmax:   110.00 cm/s LVOT Vmean:  72.300 cm/s LVOT VTI:    0.208 m  AORTA Ao Root diam: 2.80 cm MITRAL VALVE MV Area (PHT): 3.68 cm    SHUNTS MV Decel Time: 206 msec    Systemic VTI:  0.21 m MV E velocity: 79.50 cm/s  Systemic Diam: 2.00 cm MV A velocity: 69.40 cm/s MV E/A ratio:  1.15 Eleonore Chiquito MD Electronically signed by Eleonore Chiquito MD Signature Date/Time: 01/14/2021/7:04:07 PM    Final         Scheduled Meds:  capecitabine  1,500 mg Oral BID AC   enoxaparin (LOVENOX) injection  40 mg Subcutaneous Q24H   ondansetron (ZOFRAN) IV  4 mg Intravenous TID AC & HS   pantoprazole  40 mg Oral Daily   polyethylene glycol  17 g Oral BID   senna-docusate  1 tablet Oral BID   Continuous Infusions:   LOS: 1 day    Time spent: 35 minutes    Irine Seal, MD [facility]   To contact the attending provider between 7A-7P or the covering provider during after hours 7P-7A, please log into the web site [email or web address] and access using universal [place] password for that web site. If you do not have the password, please call the hospital operator.  01/15/2021, 12:58 PM

## 2021-01-16 ENCOUNTER — Inpatient Hospital Stay (HOSPITAL_COMMUNITY): Payer: 59

## 2021-01-16 LAB — BASIC METABOLIC PANEL
Anion gap: 11 (ref 5–15)
BUN: 15 mg/dL (ref 6–20)
CO2: 30 mmol/L (ref 22–32)
Calcium: 11.1 mg/dL — ABNORMAL HIGH (ref 8.9–10.3)
Chloride: 98 mmol/L (ref 98–111)
Creatinine, Ser: 0.85 mg/dL (ref 0.44–1.00)
GFR, Estimated: >60 mL/min (ref >60)
Glucose, Bld: 107 mg/dL — ABNORMAL HIGH (ref 70–99)
Potassium: 4 mmol/L (ref 3.5–5.1)
Sodium: 139 mmol/L (ref 135–145)

## 2021-01-16 LAB — CBC WITH DIFFERENTIAL/PLATELET
Abs Immature Granulocytes: 0.19 10*3/uL — ABNORMAL HIGH (ref 0.00–0.07)
Basophils Absolute: 0.1 10*3/uL (ref 0.0–0.1)
Basophils Relative: 1 %
Eosinophils Absolute: 0.3 10*3/uL (ref 0.0–0.5)
Eosinophils Relative: 4 %
HCT: 38.9 % (ref 36.0–46.0)
Hemoglobin: 12.2 g/dL (ref 12.0–15.0)
Immature Granulocytes: 3 %
Lymphocytes Relative: 10 %
Lymphs Abs: 0.8 10*3/uL (ref 0.7–4.0)
MCH: 29.8 pg (ref 26.0–34.0)
MCHC: 31.4 g/dL (ref 30.0–36.0)
MCV: 95.1 fL (ref 80.0–100.0)
Monocytes Absolute: 0.9 10*3/uL (ref 0.1–1.0)
Monocytes Relative: 11 %
Neutro Abs: 5.5 10*3/uL (ref 1.7–7.7)
Neutrophils Relative %: 71 %
Platelets: 278 10*3/uL (ref 150–400)
RBC: 4.09 MIL/uL (ref 3.87–5.11)
RDW: 14.3 % (ref 11.5–15.5)
WBC: 7.7 10*3/uL (ref 4.0–10.5)
nRBC: 0 % (ref 0.0–0.2)

## 2021-01-16 LAB — IRON AND TIBC
Iron: 69 ug/dL (ref 28–170)
Saturation Ratios: 19 % (ref 10.4–31.8)
TIBC: 361 ug/dL (ref 250–450)
UIBC: 292 ug/dL

## 2021-01-16 LAB — RETICULOCYTES
Immature Retic Fract: 26.1 % — ABNORMAL HIGH (ref 2.3–15.9)
RBC.: 4.09 MIL/uL (ref 3.87–5.11)
Retic Count, Absolute: 87.5 10*3/uL (ref 19.0–186.0)
Retic Ct Pct: 2.1 % (ref 0.4–3.1)

## 2021-01-16 LAB — VITAMIN B12: Vitamin B-12: 1608 pg/mL — ABNORMAL HIGH (ref 180–914)

## 2021-01-16 LAB — FERRITIN: Ferritin: 2032 ng/mL — ABNORMAL HIGH (ref 11–307)

## 2021-01-16 LAB — FOLATE: Folate: 9.7 ng/mL (ref >5.9)

## 2021-01-16 LAB — PHOSPHORUS: Phosphorus: 4.5 mg/dL (ref 2.5–4.6)

## 2021-01-16 LAB — MAGNESIUM: Magnesium: 1.8 mg/dL (ref 1.7–2.4)

## 2021-01-16 IMAGING — MR MR PELVIS WO/W CM
8 series · 48 of 48 positions shown · IV contrast (gadavist)
Comparison: CT pelvis [DATE]

CLINICAL DATA: Metastatic disease, evaluate for femur fracture

EXAM:
MRI PELVIS WITHOUT AND WITH CONTRAST
MRI FEMUR WITHOUT AND WITH CONTRAST
TECHNIQUE: Multiplanar multisequence MR imaging of the pelvis was performed
both before and after administration of intravenous contrast.
Multiplanar multisequence MR imaging of the femur was performed both
before and after administration of intravenous contrast.
CONTRAST:  8mL GADAVIST GADOBUTROL 1 MMOL/ML IV SOLN

[Series 6: T1 · coronal · 4.0mm · 1.25mm/px · 4 of 25 slices shown (1 of 3)]
[im 1/25]
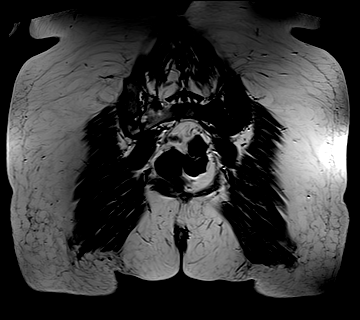
[im 9/25]
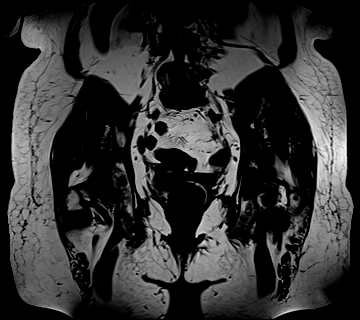
[im 17/25]
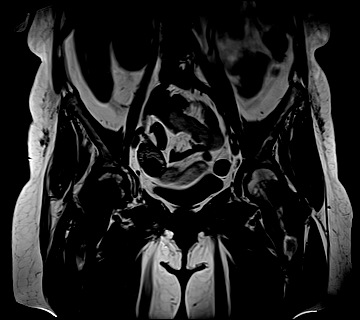
[im 25/25]
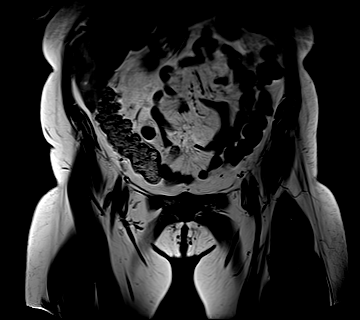

[Series 7: STIR · coronal · 4.0mm · 1.25mm/px · 4 of 25 slices shown]
[im 1/25]
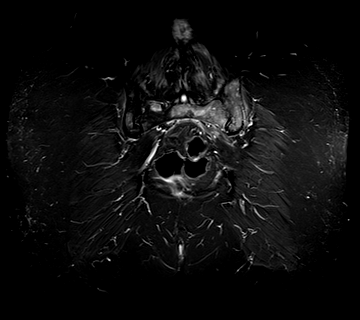
[im 9/25]
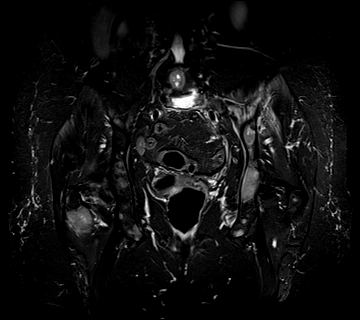
[im 17/25]
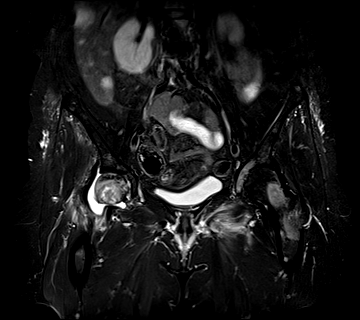
[im 25/25]
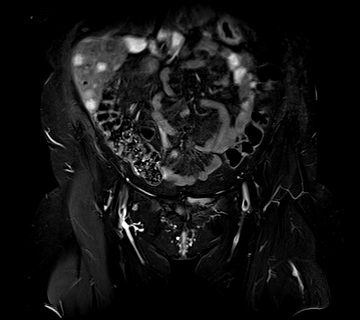

[Series 8: T1 · axial · 4.0mm · 1.09mm/px · z∈[-27,+173]mm · 7 of 41 slices shown (2 of 3)]
[im 1/41]
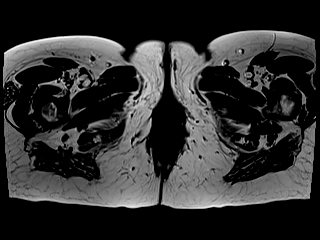
[im 7/41]
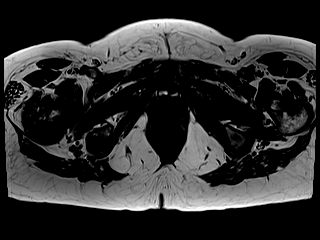
[im 14/41]
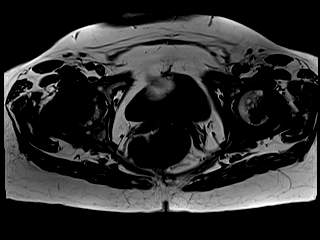
[im 21/41]
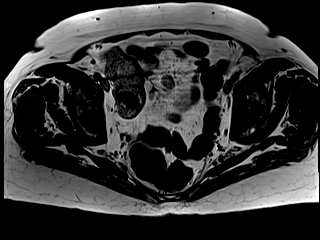
[im 27/41]
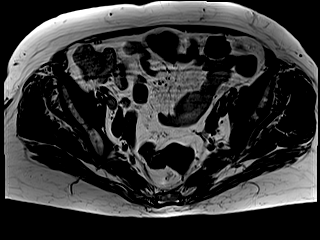
[im 34/41]
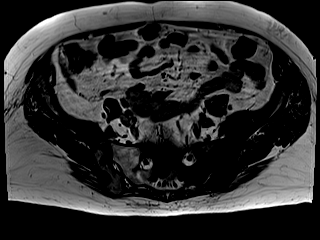
[im 41/41]
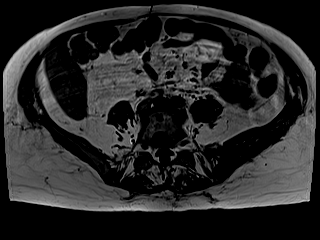

[Series 10: T2 fat-sat · axial · 4.0mm · 1.09mm/px · z∈[-27,+173]mm · 7 of 41 slices shown (1 of 2)]
[im 1/41]
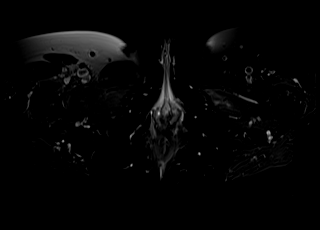
[im 7/41]
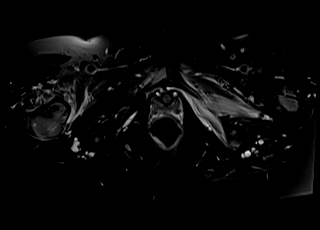
[im 14/41]
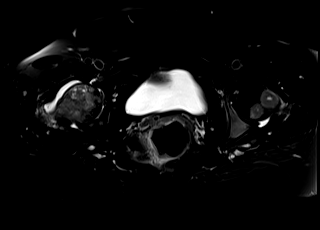
[im 21/41]
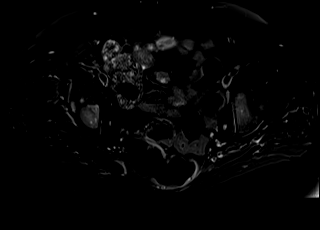
[im 27/41]
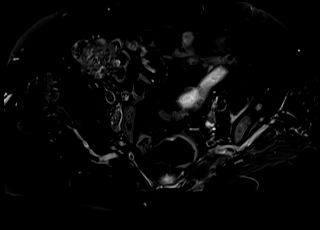
[im 34/41]
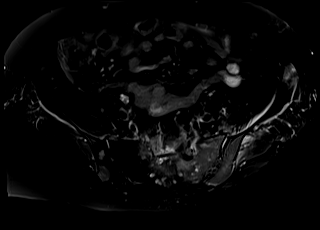
[im 41/41]
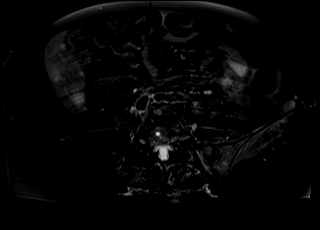

[Series 11: T2 fat-sat · sagittal · 5.0mm · 1.03mm/px · 8 of 46 slices shown (2 of 2)]
[im 1/46]
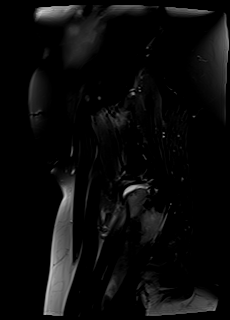
[im 7/46]
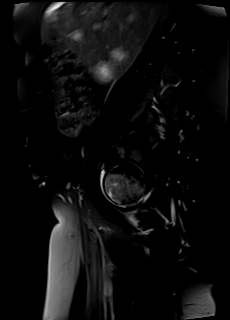
[im 13/46]
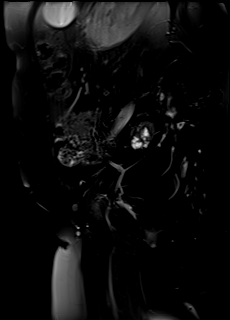
[im 20/46]
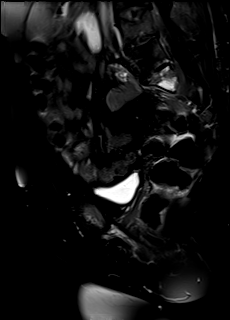
[im 26/46]
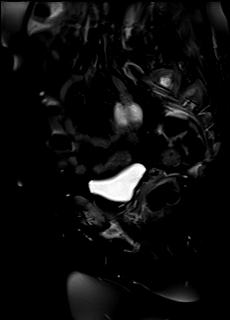
[im 33/46]
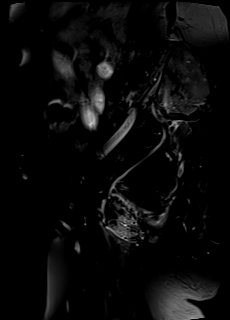
[im 39/46]
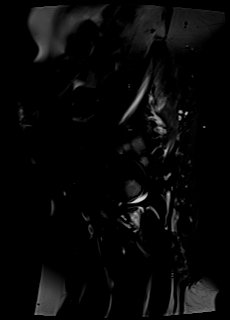
[im 46/46]
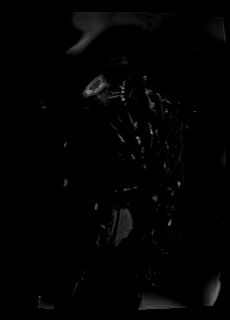

[Series 12: T1 · axial · non-contrast · 4.0mm · 1.09mm/px · z∈[-27,+173]mm · 7 of 41 slices shown (3 of 3)]
[im 1/41]
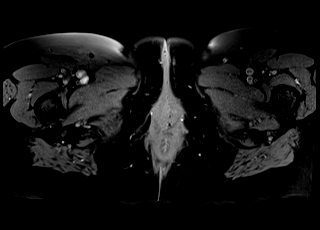
[im 7/41]
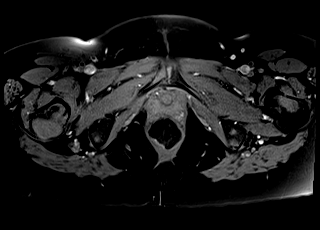
[im 14/41]
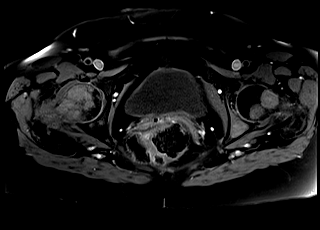
[im 21/41]
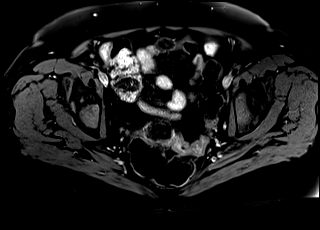
[im 27/41]
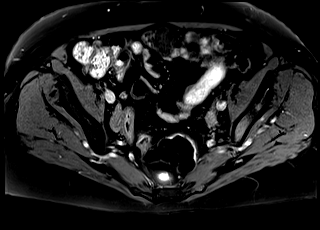
[im 34/41]
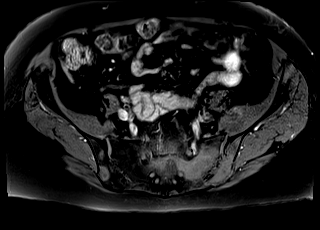
[im 41/41]
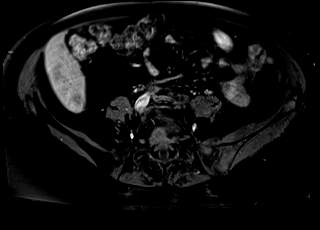

[Series 18: T1 post-contrast · axial · 4.0mm · 1.09mm/px · z∈[-27,+173]mm · 7 of 41 slices shown (1 of 2)]
[im 1/41]
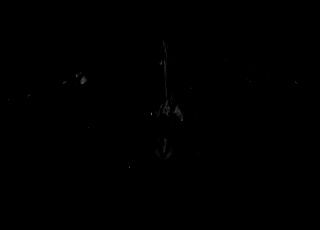
[im 7/41]
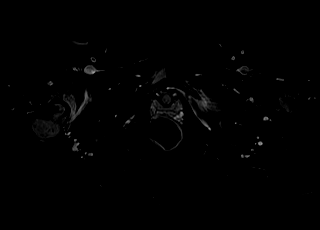
[im 14/41]
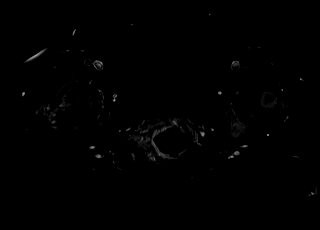
[im 21/41]
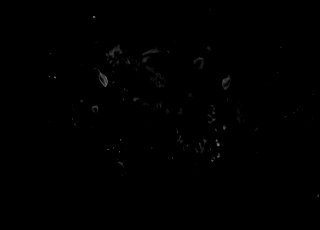
[im 27/41]
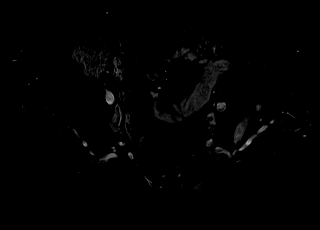
[im 34/41]
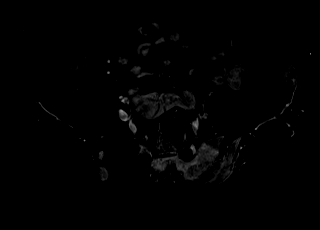
[im 41/41]
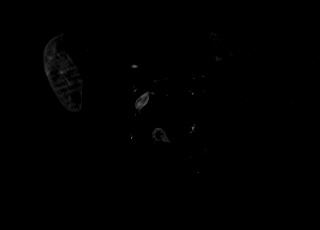

[Series 19: T1 post-contrast · coronal · 4.0mm · 1.19mm/px · 4 of 25 slices shown (2 of 2)]
[im 1/25]
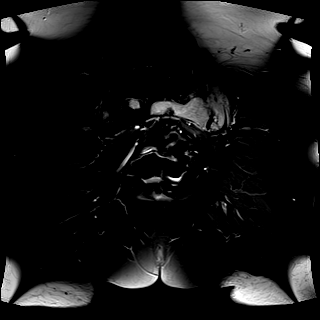
[im 9/25]
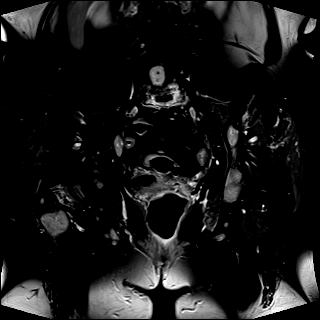
[im 17/25]
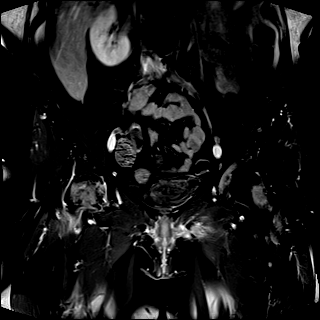
[im 25/25]
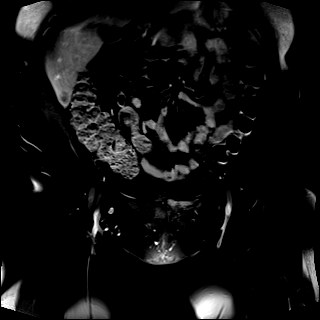

[48 of 48 positions shown; findings below may reference images not displayed]

FINDINGS: Bones:

Numerous abnormal enhancing bone lesions in the lower lumbar
vertebral bodies, sacrum, ilium, bilateral acetabulum, and bilateral
proximal femurs. Abnormal bone lesion encompasses nearly the
entirety of the left side of the sacrum and ilium with possible
nondisplaced pathologic fracture of the left sacral ala.

Abnormal bone lesion in the anterior left femoral neck with focal
severe cortical destruction placing the patient at high risk for a
pathologic fracture.

Abnormal bone lesion encompasses nearly the entirety of the right
femoral head and femoral neck with severe cortical destruction of
the anterior femoral head-neck junction placing the patient at high
risk for a pathologic fracture. Although no acute fracture is
identified on this MRI, there is cortical irregularity along the
recent CT of the pelvis performed on [DATE] concerning for a
nondisplaced pathologic fracture.

Abnormal bone lesions throughout the inferior pubic rami
bilaterally. Pathologic nondisplaced fracture of the left inferior
pubic ramus.

Abnormal bone lesions in the right distal femoral diaphysis and
metaphysis with cortical destruction of the posterolateral margin.
At small abnormal bone lesion in the mid left femoral diaphysis.
Abnormal bone lesions in the left distal femoral metaphysis.

No other acute fracture or dislocation.

Normal sacrum and sacroiliac joints. No SI joint widening or erosive
changes.

Articular cartilage and labrum

Articular cartilage:  No chondral defect.

Labrum: Grossly intact, but evaluation is limited by lack of
intraarticular fluid.

Joint or bursal effusion

Joint effusion: Large right hip joint effusion. No left hip joint
effusion. No right SI joint effusion. Small left SI joint effusion.

Bursae: No bursa formation.

Muscles and tendons

Flexors: Normal.

Extensors: Normal.

Abductors: Normal.

Adductors: Normal.

Gluteals: Mild edema in the left gluteus minimus muscle likely
reflecting mild muscle strain.

Hamstrings: Normal.

Other findings

No pelvic free fluid. No fluid collection or hematoma. No inguinal
lymphadenopathy. No inguinal hernia.
IMPRESSION: 1. Extensive osseous metastatic disease likely secondary to breast
cancer involving the lower lumbar vertebral bodies, sacrum, ilium,
bilateral acetabulum, and bilateral femurs as described above.
2. Abnormal bone lesion encompasses nearly the entirety of the right
femoral head and femoral neck with severe cortical destruction of
the anterior femoral head-neck junction placing the patient at high
risk for a pathologic fracture. Although no acute fracture is
identified on this MRI, there is cortical irregularity on the recent
CT of the pelvis performed on [DATE] concerning for a
nondisplaced pathologic fracture.
3. Pathologic nondisplaced fracture of the left inferior pubic
ramus.
4. Abnormal bone lesion in the anterior left femoral neck with focal
severe cortical destruction placing the patient at high risk for a
pathologic fracture.
5. Abnormal bone lesion encompasses nearly the entirety of the left
side of the sacrum and ilium with possible nondisplaced pathologic
fracture of the left sacral ala.
6. Large right hip joint effusion.
7. Mild edema in the left gluteus minimus muscle likely reflecting
mild muscle strain.

## 2021-01-16 IMAGING — MR MR FEMUR*R* WO/W CM
7 of 8 series · 34 of 40 positions shown · IV contrast (gadavist)
Comparison: CT pelvis [DATE]

CLINICAL DATA: Metastatic disease, evaluate for femur fracture

EXAM:
MRI PELVIS WITHOUT AND WITH CONTRAST
MRI FEMUR WITHOUT AND WITH CONTRAST
TECHNIQUE: Multiplanar multisequence MR imaging of the pelvis was performed
both before and after administration of intravenous contrast.
Multiplanar multisequence MR imaging of the femur was performed both
before and after administration of intravenous contrast.
CONTRAST:  8mL GADAVIST GADOBUTROL 1 MMOL/ML IV SOLN

[Series 2: T1 · coronal · 4.0mm · 1.95mm/px · 4 of 34 slices shown (1 of 2)]
[im 1/34]
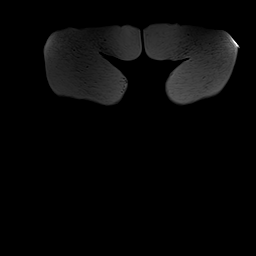
[im 12/34]
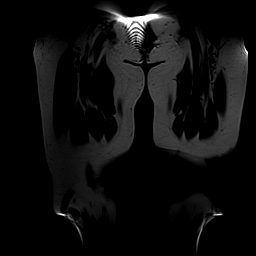
[im 23/34]
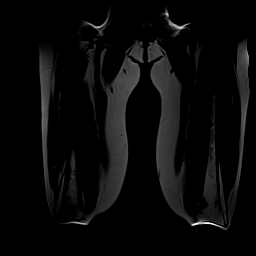
[im 34/34]
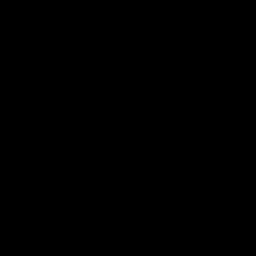

[Series 3: STIR · coronal · 4.0mm · 1.95mm/px · 4 of 35 slices shown (1 of 2)]
[im 1/35]
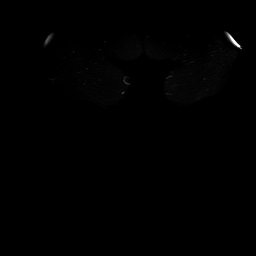
[im 12/35]
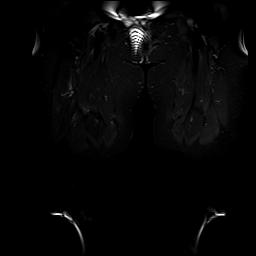
[im 23/35]
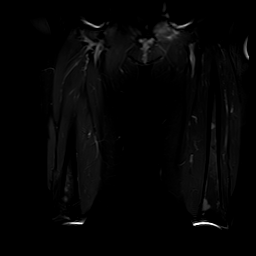
[im 35/35]
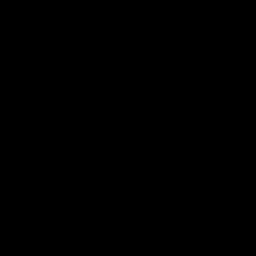

[Series 4: T1 · axial · 4.0mm · 1.02mm/px · z∈[-318,-62]mm · 6 of 55 slices shown (2 of 2)]
[im 1/55]
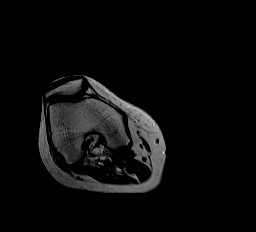
[im 11/55]
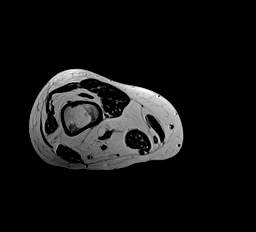
[im 22/55]
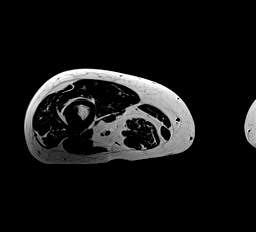
[im 33/55]
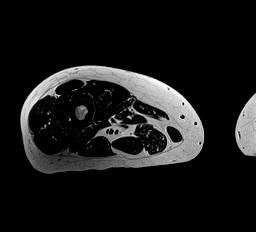
[im 44/55]
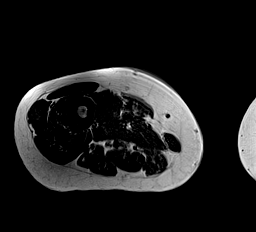
[im 55/55]
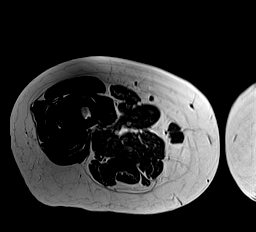

[Series 5: T2 fat-sat · axial · 4.0mm · 1.02mm/px · z∈[-318,-62]mm · 6 of 55 slices shown]
[im 1/55]
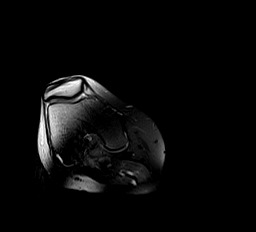
[im 11/55]
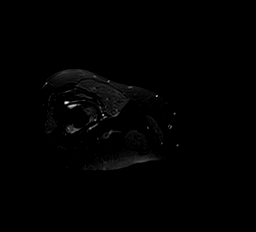
[im 22/55]
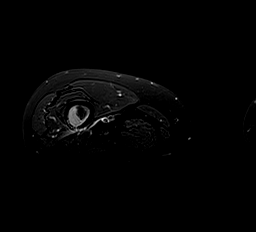
[im 33/55]
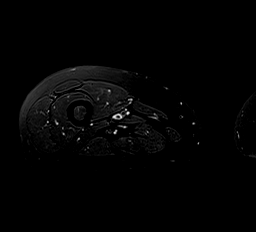
[im 44/55]
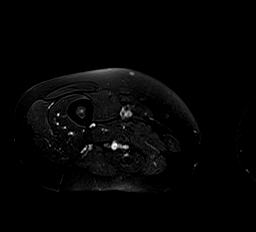
[im 55/55]
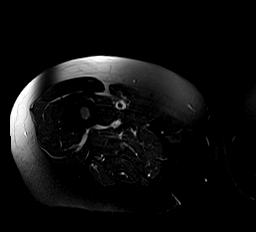

[Series 6: STIR · sagittal · 4.0mm · 1.56mm/px · 4 of 40 slices shown (2 of 2)]
[im 1/40]
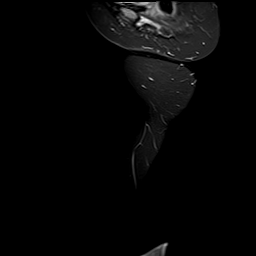
[im 14/40]
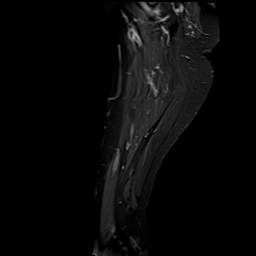
[im 27/40]
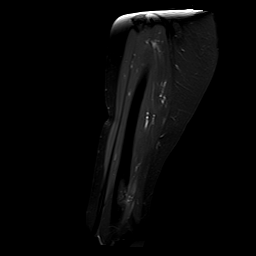
[im 40/40]
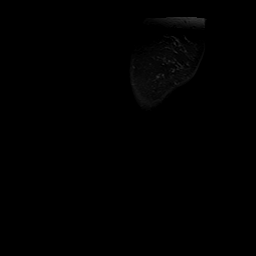

[Series 7: pre axial ti · axial · non-contrast · 4.0mm · 0.51mm/px · z∈[-314,-58]mm · 6 of 55 slices shown]
[im 1/55]
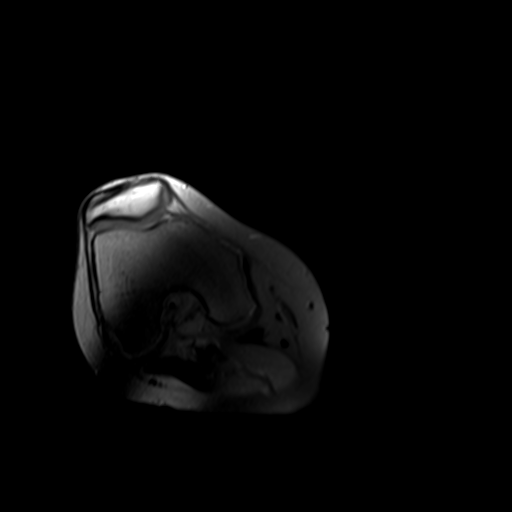
[im 11/55]
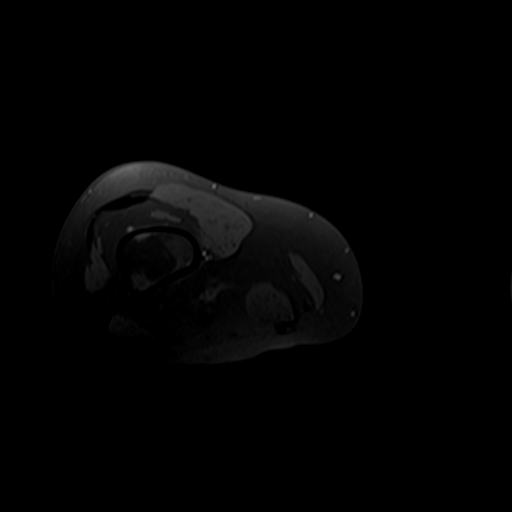
[im 22/55]
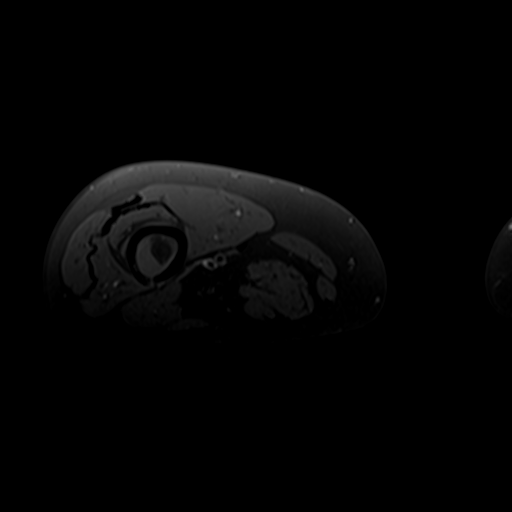
[im 33/55]
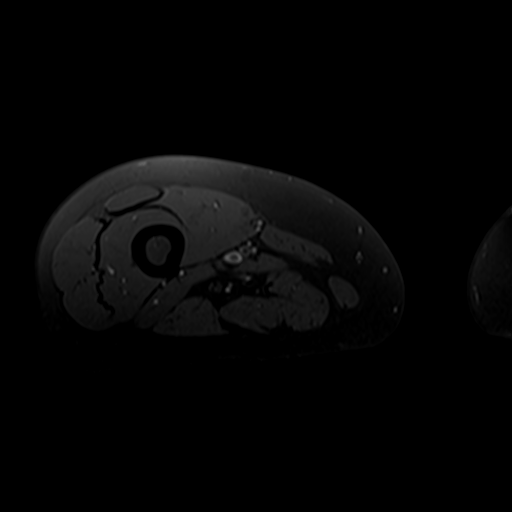
[im 44/55]
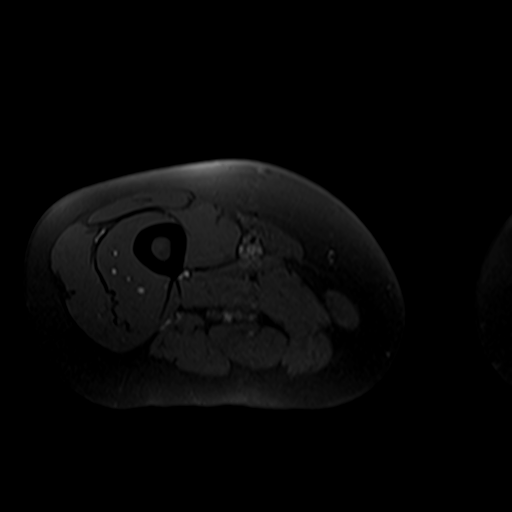
[im 55/55]
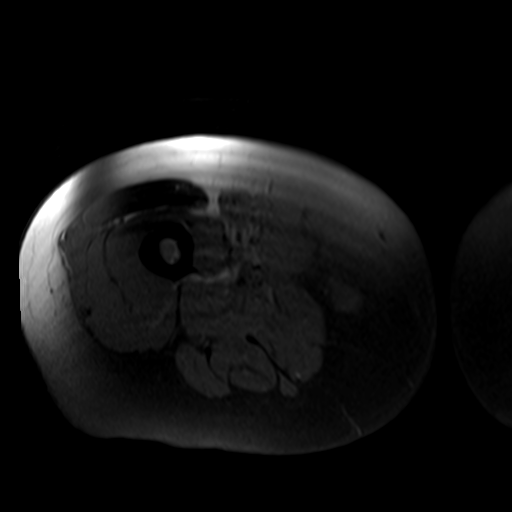

[Series 8: post axial ti · axial · 4.0mm · 0.51mm/px · z∈[-314,-162]mm · 4 of 55 slices shown]
[im 1/55]
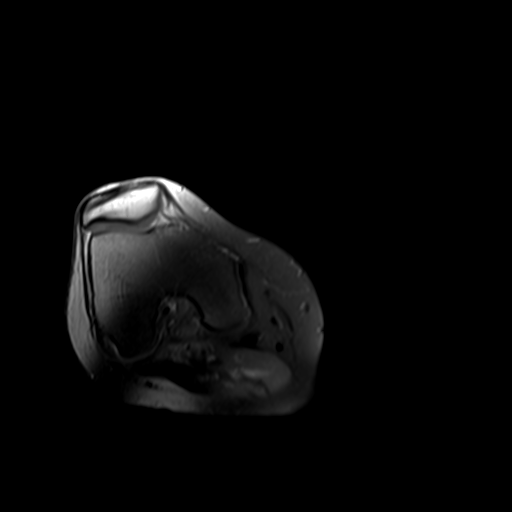
[im 11/55]
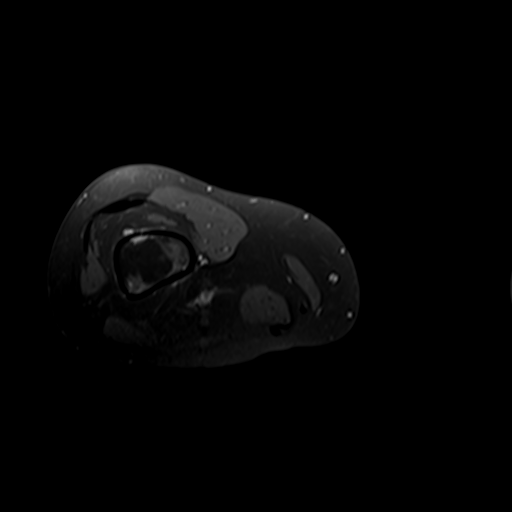
[im 22/55]
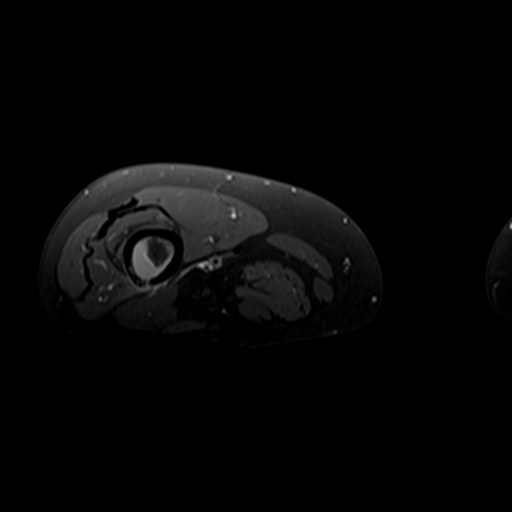
[im 33/55]
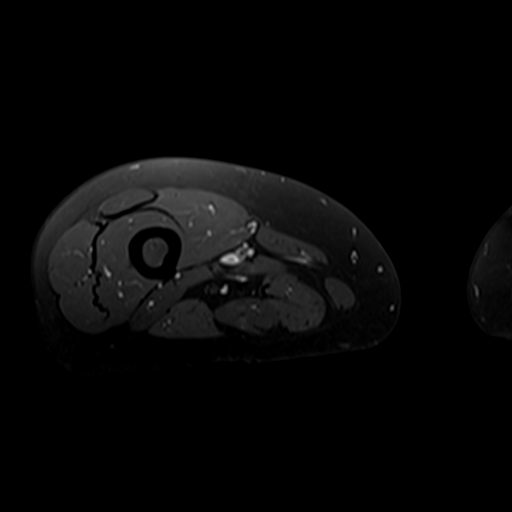

[34 of 40 positions shown; findings below may reference images not displayed]

FINDINGS: Bones:

Numerous abnormal enhancing bone lesions in the lower lumbar
vertebral bodies, sacrum, ilium, bilateral acetabulum, and bilateral
proximal femurs. Abnormal bone lesion encompasses nearly the
entirety of the left side of the sacrum and ilium with possible
nondisplaced pathologic fracture of the left sacral ala.

Abnormal bone lesion in the anterior left femoral neck with focal
severe cortical destruction placing the patient at high risk for a
pathologic fracture.

Abnormal bone lesion encompasses nearly the entirety of the right
femoral head and femoral neck with severe cortical destruction of
the anterior femoral head-neck junction placing the patient at high
risk for a pathologic fracture. Although no acute fracture is
identified on this MRI, there is cortical irregularity along the
recent CT of the pelvis performed on [DATE] concerning for a
nondisplaced pathologic fracture.

Abnormal bone lesions throughout the inferior pubic rami
bilaterally. Pathologic nondisplaced fracture of the left inferior
pubic ramus.

Abnormal bone lesions in the right distal femoral diaphysis and
metaphysis with cortical destruction of the posterolateral margin.
At small abnormal bone lesion in the mid left femoral diaphysis.
Abnormal bone lesions in the left distal femoral metaphysis.

No other acute fracture or dislocation.

Normal sacrum and sacroiliac joints. No SI joint widening or erosive
changes.

Articular cartilage and labrum

Articular cartilage:  No chondral defect.

Labrum: Grossly intact, but evaluation is limited by lack of
intraarticular fluid.

Joint or bursal effusion

Joint effusion: Large right hip joint effusion. No left hip joint
effusion. No right SI joint effusion. Small left SI joint effusion.

Bursae: No bursa formation.

Muscles and tendons

Flexors: Normal.

Extensors: Normal.

Abductors: Normal.

Adductors: Normal.

Gluteals: Mild edema in the left gluteus minimus muscle likely
reflecting mild muscle strain.

Hamstrings: Normal.

Other findings

No pelvic free fluid. No fluid collection or hematoma. No inguinal
lymphadenopathy. No inguinal hernia.
IMPRESSION: 1. Extensive osseous metastatic disease likely secondary to breast
cancer involving the lower lumbar vertebral bodies, sacrum, ilium,
bilateral acetabulum, and bilateral femurs as described above.
2. Abnormal bone lesion encompasses nearly the entirety of the right
femoral head and femoral neck with severe cortical destruction of
the anterior femoral head-neck junction placing the patient at high
risk for a pathologic fracture. Although no acute fracture is
identified on this MRI, there is cortical irregularity on the recent
CT of the pelvis performed on [DATE] concerning for a
nondisplaced pathologic fracture.
3. Pathologic nondisplaced fracture of the left inferior pubic
ramus.
4. Abnormal bone lesion in the anterior left femoral neck with focal
severe cortical destruction placing the patient at high risk for a
pathologic fracture.
5. Abnormal bone lesion encompasses nearly the entirety of the left
side of the sacrum and ilium with possible nondisplaced pathologic
fracture of the left sacral ala.
6. Large right hip joint effusion.
7. Mild edema in the left gluteus minimus muscle likely reflecting
mild muscle strain.

## 2021-01-16 IMAGING — DX DG FEMUR 2+V PORT*R*
4 series · 4 of 4 positions shown · non-contrast
Comparison: CT [DATE]

CLINICAL DATA: Right femur pain history of cancer with Mets

EXAM:
RIGHT FEMUR PORTABLE 2 VIEW

[femur ap (1 of 2)]
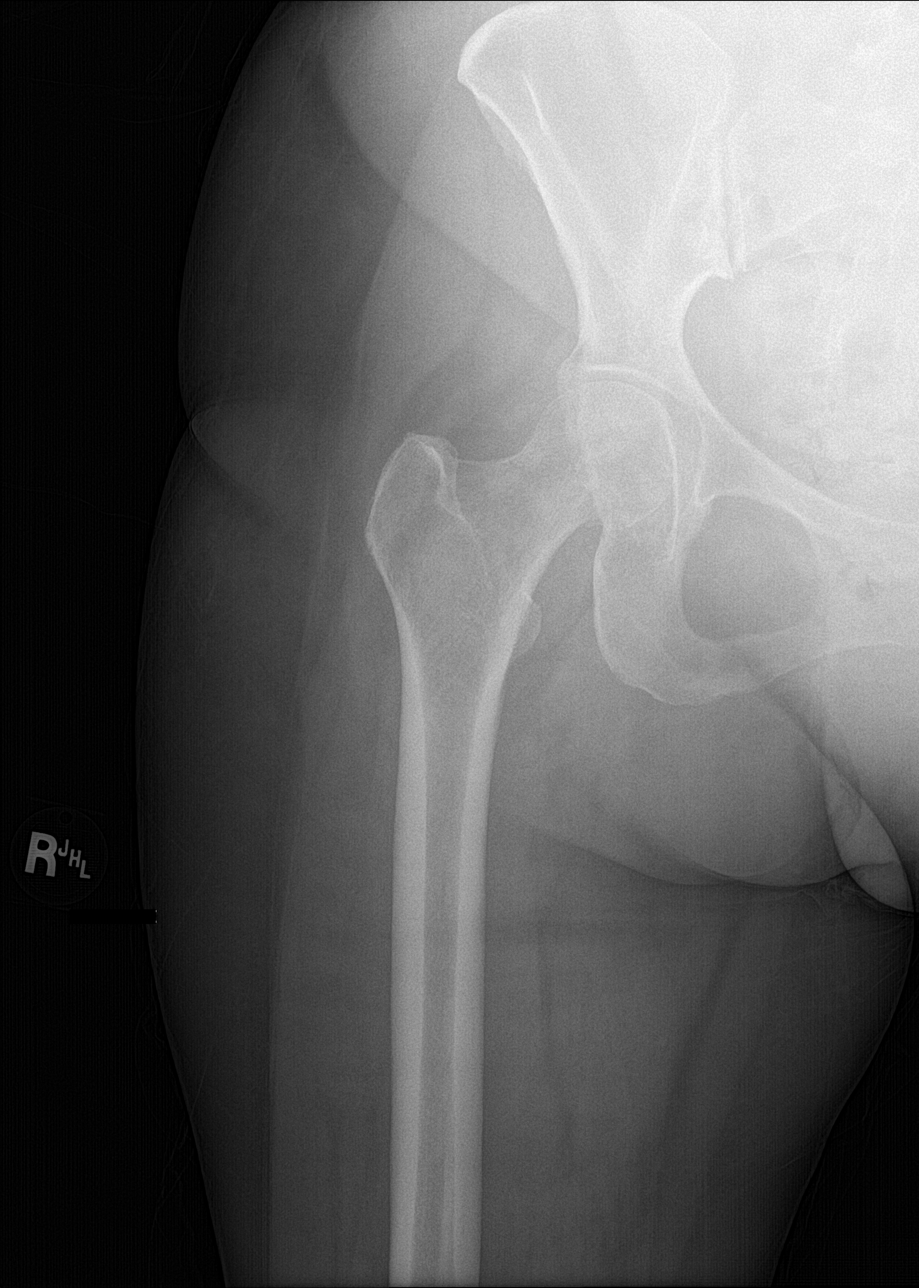

[femur ap (2 of 2)]
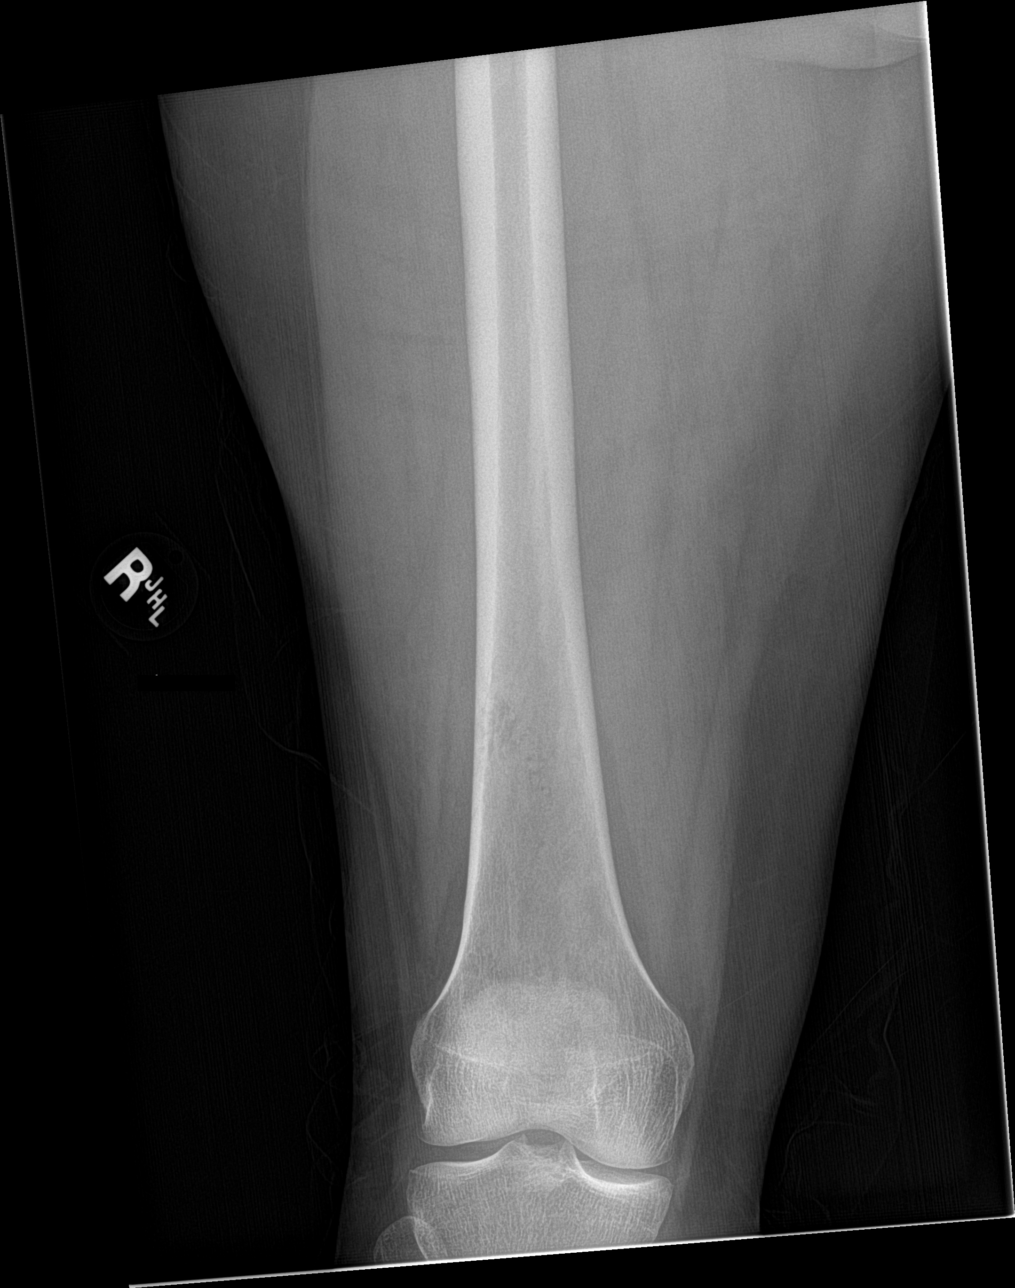

[femur lat (1 of 2)]
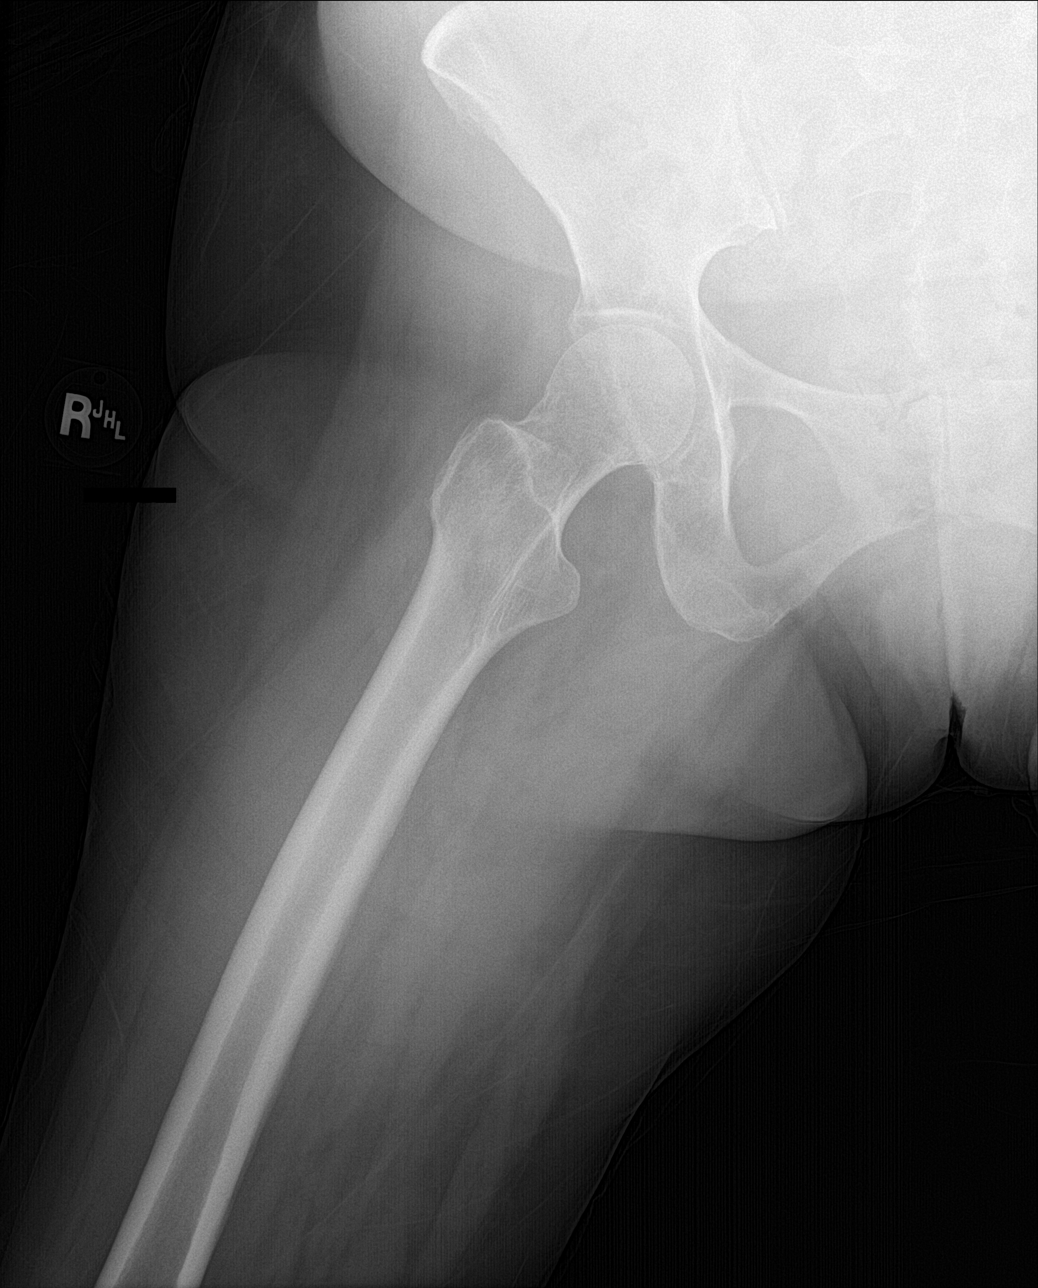

[femur lat (2 of 2)]
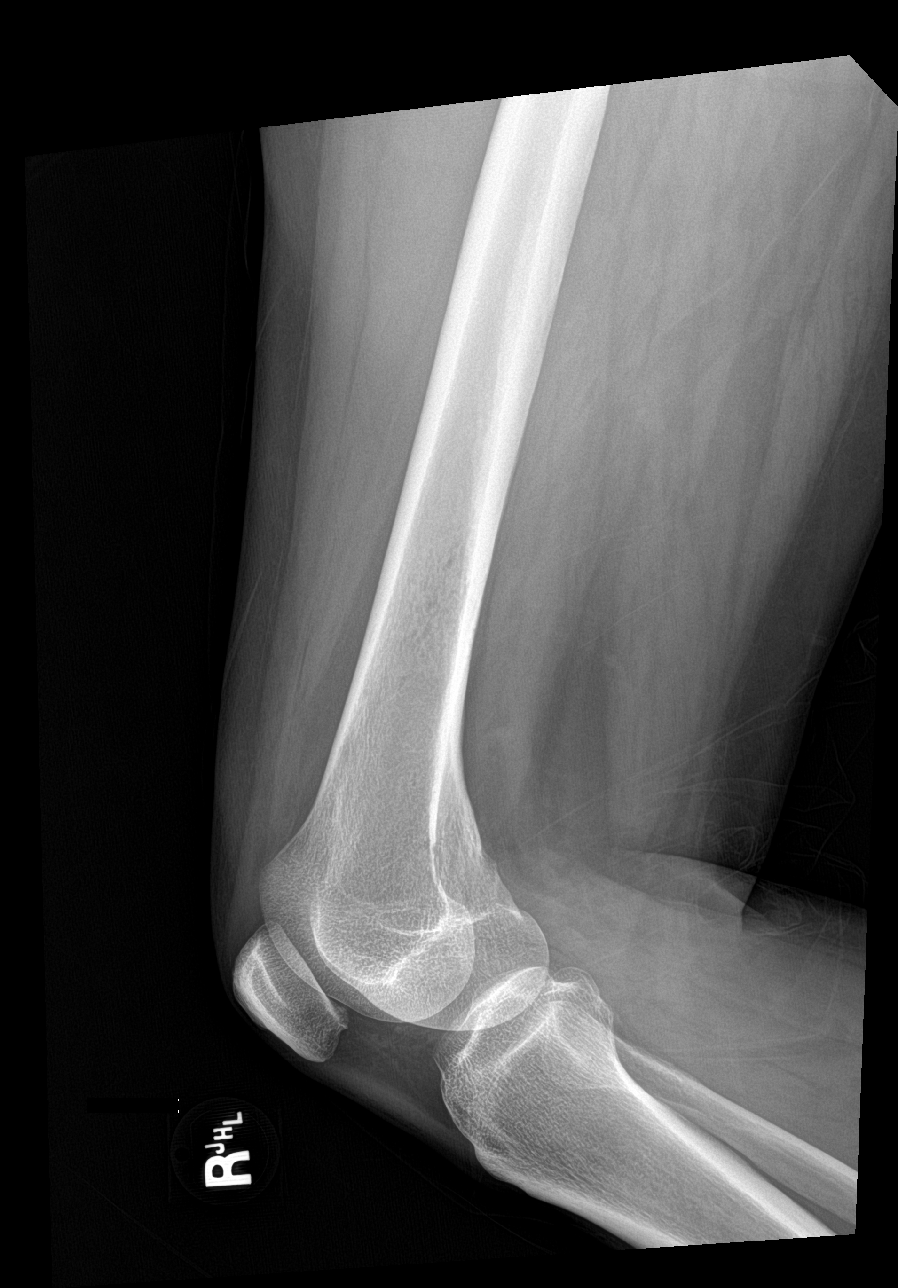

[4 of 4 positions shown; findings below may reference images not displayed]

FINDINGS: Focal permeative appearance of the distal femoral shaft, raises
concern for metastatic disease. Lucent lesion seen on recent pelvic
CT within the proximal femur is better seen on CT. No malalignment.
Suspected right femoral neck fracture is also better seen on CT.
IMPRESSION: 1. CT demonstrated metastatic lucent lesions on recent pelvic CT at
the proximal right femur better demonstrated on CT. Possible subtle
step-off at the femoral neck, may correspond to suspected femoral
neck fracture on CT.
2. Focal permeative appearance of the distal shaft of the right
femur concerning for metastatic focus.

## 2021-01-16 MED ORDER — SORBITOL 70 % SOLN: 30.0000 mL | Status: AC

## 2021-01-16 MED ORDER — CALCITONIN (SALMON) 200 UNIT/ACT NA SOLN
1.0000 | Freq: Every day | NASAL | Status: DC
  Administered 2021-01-16 – 2021-01-19 (×4): 1 via NASAL
  Filled 2021-01-16: qty 3.7

## 2021-01-16 MED ORDER — GABAPENTIN 300 MG PO CAPS
300.0000 mg | ORAL_CAPSULE | Freq: Three times a day (TID) | ORAL | Status: DC
  Administered 2021-01-16 – 2021-01-21 (×16): 300 mg via ORAL
  Filled 2021-01-16 (×16): qty 1

## 2021-01-16 MED ORDER — SODIUM CHLORIDE 0.9 % IV SOLN: INTRAVENOUS | Status: DC

## 2021-01-16 MED ORDER — SODIUM CHLORIDE 0.9 % IV SOLN
60.0000 mg | Freq: Once | INTRAVENOUS | Status: AC
  Administered 2021-01-16: 60 mg via INTRAVENOUS
  Filled 2021-01-16: qty 10
  Filled 2021-01-16: qty 20

## 2021-01-16 MED ORDER — GADOBUTROL 1 MMOL/ML IV SOLN
8.0000 mL | Freq: Once | INTRAVENOUS | Status: AC | PRN
  Administered 2021-01-16: 8 mL via INTRAVENOUS

## 2021-01-16 MED ORDER — HYDROMORPHONE HCL 1 MG/ML IJ SOLN
1.0000 mg | INTRAMUSCULAR | Status: DC | PRN
  Administered 2021-01-16 – 2021-01-17 (×8): 1 mg via INTRAVENOUS
  Filled 2021-01-16 (×8): qty 1

## 2021-01-16 MED ORDER — BISACODYL 10 MG RE SUPP: 10.0000 mg | Freq: Once | RECTAL | Status: DC

## 2021-01-16 NOTE — Progress Notes (Signed)
We wrote the order for kyphoplasty at L4.  This had been previously reviewed by IR and a consult by Desha late June.  However upon further review of the new films Dr. Laurence Ferrari felt that the problem might well be bilateral femoral fractures.  He suggested that we obtain an MRI of the pelvis and an orthopedic consult.  I have written those orders.  I discussed the case with Dr. Erlinda Hong who will review the films and the patient and let us know his impression and plan.

## 2021-01-16 NOTE — Progress Notes (Signed)
Patient ID: Monica [name], female   DOB: [birth year], 50 y.o.   MRN: [id number] Request received from oncology for kyphoplasty on patient.  She is familiar to our service from prior consultation with Dr. Laurence [name] on 01/09/2021 to assess treatment options for painful pathologic fractures.  At that time plan was to pursue 2 separate treatment sessions ,the first targeting L2, L4 and a left iliac lesion and the second session targeting T9-T12 with combination RFA and osteocool ablation.  Since that time patient has been admitted to [facility] with worsening right greater than left hip pain, as well as mid to lower back pain and evidence of progressive metastatic disease on imaging since May of this year.  Patient does have significant difficulty ambulating, bearing weight and needs to use a walker.  CT pelvis performed yesterday revealed interval development of significant metastatic disease to the bilateral proximal femurs compared to study in May of this year.  Imaging studies were reviewed by Dr. Laurence [name] today.  He recommends MRI of the pelvis for further evaluation as well as orthopedic surgery consultation.  We have also been informed that patient's [employer] policy expired on 2/35/3614.  She is currently in transition to obtain Medicaid.  She is also in the process of discussing cobra extension with [employer].  Drs. Monica [name] and Monica [name] as well as patient have been informed of above plans.  We will continue to monitor.

## 2021-01-16 NOTE — Progress Notes (Signed)
Matelyn Rhyner   DOB:1970/08/05   BM#:[id number]   UUV#:[id number]  Subjective:  Destinie had more pain today. Her dilaudid was accordingly increased. She vomited at supper. Possibly the ondansetron (given very shortly before the dilaudid) did not have time to work. She met with Dr Erlinda Hong and appreciated his input. Carlena Bjornstad is consideringall options. Mother in room  Objective: White woman examined in bed Vitals:   01/15/21 2106 01/16/21 0528  BP: (!) 142/81 132/79  Pulse: 89 91  Resp: 16 16  Temp: 98.2 F (36.8 C) 98.5 F (36.9 C)  SpO2: 98% 95%    Body mass index is 27.75 kg/m. No intake or output data in the 24 hours ending 01/16/21 1842   CBG (last 3)  No results for input(s): GLUCAP in the last 72 hours.   Labs:  Lab Results  Component Value Date   WBC 7.7 01/16/2021   HGB 12.2 01/16/2021   HCT 38.9 01/16/2021   MCV 95.1 01/16/2021   PLT 278 01/16/2021   NEUTROABS 5.5 01/16/2021    '@LASTCHEMISTRY' @  Urine Studies No results for input(s): UHGB, CRYS in the last 72 hours.  Invalid input(s): UACOL, UAPR, USPG, UPH, UTP, UGL, UKET, UBIL, UNIT, UROB, ULEU, UEPI, UWBC, URBC, UBAC, CAST, UCOM, BILUA  Basic Metabolic Panel: Recent Labs  Lab 01/14/21 1255 01/16/21 0516  NA 137 139  K 3.6 4.0  CL 97* 98  CO2 29 30  GLUCOSE 101* 107*  BUN 18 15  CREATININE 0.84 0.85  CALCIUM 10.5* 11.1*  MG  --  1.8  PHOS  --  4.5   GFR Estimated Creatinine Clearance: 83.5 mL/min (by C-G formula based on SCr of 0.85 mg/dL). Liver Function Tests: Recent Labs  Lab 01/14/21 1255  AST 82*  ALT 73*  ALKPHOS 166*  BILITOT 0.8  PROT 7.3  ALBUMIN 3.8   No results for input(s): LIPASE, AMYLASE in the last 168 hours. No results for input(s): AMMONIA in the last 168 hours. Coagulation profile No results for input(s): INR, PROTIME in the last 168 hours.  CBC: Recent Labs  Lab 01/14/21 1255 01/16/21 0516  WBC 10.6* 7.7  NEUTROABS 8.3* 5.5  HGB 11.9* 12.2  HCT 37.3 38.9  MCV 94.4  95.1  PLT 270 278   Cardiac Enzymes: No results for input(s): CKTOTAL, CKMB, CKMBINDEX, TROPONINI in the last 168 hours. BNP: Invalid input(s): POCBNP CBG: No results for input(s): GLUCAP in the last 168 hours. D-Dimer No results for input(s): DDIMER in the last 72 hours. Hgb A1c No results for input(s): HGBA1C in the last 72 hours. Lipid Profile No results for input(s): CHOL, HDL, LDLCALC, TRIG, CHOLHDL, LDLDIRECT in the last 72 hours. Thyroid function studies No results for input(s): TSH, T4TOTAL, T3FREE, THYROIDAB in the last 72 hours.  Invalid input(s): FREET3 Anemia work up Recent Labs    01/16/21 0516  VITAMINB12 1,608*  FOLATE 9.7  FERRITIN 2,032*  TIBC 361  IRON 69  RETICCTPCT 2.1   Microbiology Recent Results (from the past 240 hour(s))  SARS CORONAVIRUS 2 (TAT 6-24 HRS) Nasopharyngeal Nasopharyngeal Swab     Status: None   Collection Time: 01/14/21  4:43 PM   Specimen: Nasopharyngeal Swab  Result Value Ref Range Status   SARS Coronavirus 2 NEGATIVE NEGATIVE Final    Comment: (NOTE) SARS-CoV-2 target nucleic acids are NOT DETECTED.  The SARS-CoV-2 RNA is generally detectable in upper and lower respiratory specimens during the acute phase of infection. Negative results do not preclude SARS-CoV-2 infection, do not  rule out co-infections with other pathogens, and should not be used as the sole basis for treatment or other patient management decisions. Negative results must be combined with clinical observations, patient history, and epidemiological information. The expected result is Negative.  Fact Sheet for Patients: SugarRoll.be  Fact Sheet for Healthcare Providers: https://www.woods-mathews.com/  This test is not yet approved or cleared by the [place] FDA and  has been authorized for detection and/or diagnosis of SARS-CoV-2 by FDA under an Emergency Use Authorization (EUA). This EUA will remain  in  effect (meaning this test can be used) for the duration of the COVID-19 declaration under Se ction 564(b)(1) of the Act, 21 U.S.C. section 360bbb-3(b)(1), unless the authorization is terminated or revoked sooner.  Performed at [facility], [place] [address], [place], [place] [address]       Studies:  CT PELVIS WO CONTRAST  Addendum Date: 01/16/2021   ADDENDUM REPORT: 01/16/2021 12:44 ADDENDUM: Upon further review, there has been interval development of significant metastatic disease to the bilateral proximal femurs compared to the relatively recent prior study dated 11/30/2020. On the right, several cortical lucencies are visible in the region of the subcapital and mid femoral neck highly concerning for pathologic fracture. Metastatic disease also extends into the femoral head itself. On the left, and anteriorly located lytic lesion appears to result in cortical breakthrough along the anterior surface of the proximal femoral neck (see sagittal images 153 in 154 of series 8). These findings may represent the primary source of the patient's progressive bilateral hip pain. These results will be called to the ordering clinician or representative by the Radiologist Assistant, and communication documented in the PACS or [employer]. Electronically Signed   By: Jacqulynn Cadet M.D.   On: 01/16/2021 12:44   Result Date: 01/16/2021 CLINICAL DATA:  Breast cancer, pelvic pain, known osseous metastases EXAM: CT PELVIS WITHOUT CONTRAST TECHNIQUE: Multidetector CT imaging of the pelvis was performed following the standard protocol without intravenous contrast. COMPARISON:  CT abdomen/pelvis dated 11/30/2020 FINDINGS: Urinary Tract:  Bladder is within normal limits. Bowel:  Visualized bowel is unremarkable. Vascular/Lymphatic: No evidence of aneurysm. No suspicious pelvic lymphadenopathy. Reproductive:  Status post hysterectomy. Bilateral ovaries are within normal limits. Other:  No pelvic ascites.  Subcutaneous injection sites along the left lower abdominal wall (series 6/image 68). Musculoskeletal: Interval superior endplate compression fracture deformity at L4 (sagittal image 102), with 20% loss of height centrally, but no retropulsion. This reflects a pathologic fracture with central lytic lesion. Otherwise, this appearance is overall unchanged. Multifocal lytic/sclerotic metastases in the lower lumbar spine (series 3/image 11), sacrum series 3/image 35), left iliac bone (series 3/image 43), right femoral head (series 3/image 91), left femoral head/neck (series 3/image 101), and bilateral inferior pubic rami/parasymphyseal regions (series 3/image 106). IMPRESSION: Interval superior endplate compression fracture deformity at L4, with 20% loss of height centrally, but no retropulsion. This reflects a pathologic fracture. Additional multifocal lytic/sclerotic osseous metastases in the visualized axial and appendicular skeleton, unchanged. Electronically Signed: By: Julian Hy M.D. On: 01/14/2021 19:59   DG FEMUR PORT, MIN 2 VIEWS RIGHT  Result Date: 01/16/2021 CLINICAL DATA:  Right femur pain history of cancer with Mets EXAM: RIGHT FEMUR PORTABLE 2 VIEW COMPARISON:  CT 01/14/2021 FINDINGS: Focal permeative appearance of the distal femoral shaft, raises concern for metastatic disease. Lucent lesion seen on recent pelvic CT within the proximal femur is better seen on CT. No malalignment. Suspected right femoral neck fracture is also better seen on  CT. IMPRESSION: 1. CT demonstrated metastatic lucent lesions on recent pelvic CT at the proximal right femur better demonstrated on CT. Possible subtle step-off at the femoral neck, may correspond to suspected femoral neck fracture on CT. 2. Focal permeative appearance of the distal shaft of the right femur concerning for metastatic focus. Electronically Signed   By: Donavan Foil M.D.   On: 01/16/2021 18:23    Assessment: 50 y.o.[place], [place] woman status  post right breast upper inner quadrant biopsy 12/29/2017, for a clinical T1c N0, stage IA invasive ductal carcinoma, triple positive, with an MIB-1 of 80%.   (1) genetics testing 02/02/2018 though the CancerNext gene panel offered by [facility] showed no deleterious mutations in  APC, ATM, BARD1, BMPR1A,BRCA1, BRCA2, BRIP1, CDH1, CDK4, CDKN2A, CHEK2, DICER1, HOXB13, MLH1, MRE11A, MSH2, MSH6, MUTYH, NBN, NF1, PALB2, PMS2, POLD1, POLE, PTEN, RAD50, RAD51C, RAD51D, SMAD4, SMARCA4, STK11 and TP53 (sequencing and deletion/duplication); EPCAM and GREM1 (deletion/duplication only).             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               (a) Pertuzumab omitted after cycle 1 due to diarrhea.             (b) Gemcitabine substituted for docetaxel starting with cycle 5 due to peripheral neuropathy   (4) continued trastuzumab to total 6 months (through February 2020).             (a) echocardiogram 01/21/2018 showed an ejection fraction in the 55-60% range             (b) repeat echocardiogram 06/14/2018 showed an ejection fraction in the 60-65%   (5) adjuvant radiation 08/15/2018 - 09/28/2018  Site/dose:   The patient initially received a dose of 50.4 Gy in 28 fractions to the right breast using whole-breast tangent fields. This was delivered using a 3-D conformal technique. The patient then received a boost to the seroma. This delivered an additional 10 Gy in 5 fractions using 12E, 9E electrons with a special teletherapy technique. The total dose was 60.4 Gy.    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, involving brain, liver, lungs, and bones (7) presenting 11/29/2020 with progressive back pain:             (a) non-contrast CT abd/pelvis (renal stone study) 11/29/2020 shows bone metastases             (b) CT chest/abd/pelvis with contrast 11/30/2020 shows multiple large liver masses, multiple pulmonary nodules, and widespread lytic bone lesions             (c) MRI brain  shows at least 4 brain lesions, largest 3.8 cm             (d) total spinal MRI 11/30/2020 shows pathologic fractures at T9, T12, possibly L2, as well as numerous other spine lesions, but no epidural enhancement or cord compression             (e) liver biopsy 12/02/2020 shows adenocarcinoma, prognostic panel again triple positive             (f) pre-op repeat MRI 12/01/2020 shows additional brain lesions, at least 8 of which will be targets for SRS   (8) CNS treatment:             (a) [place] 12/05/2020             (b) surgery 12/06/2020 confirmed metastatic carcinoma, estrogen receptor positive, HER2 amplified, progesterone receptor negative, with an MIB-1 of 70%.   (9) XRT to spine 12/05/2020 through 12/23/2020 Site Technique Total Dose (Gy) Dose per Fx (Gy) Completed Fx Beam Energies  Thoracic Spine: Spine_T9-L3 Complex 30/30 3 10/10 15X  Pelvis: Pelvis_sacrum Complex 30/30 3 10/10 15X    (10) advanced directives-- plans to name son as HCPOA with her father as secondary   (11) started capecitabine 01/08/2021, to start trastuzumab 01/22/2021, tucatinib 01/29/2021  (A) echo 01/14/2021   (12) to start denosumab/Xgeva 01/22/2021, repeated every 6 weeks     Plan: Appreciate IR's and Dr Phoebe Sharps input. I wrote for the pelvis MRI as suggested. If it is felt  that surgery or kyphoplasty would have a fair chance of relieving the patient's pain, she and the family would agree to proceeding and I would support that.   I am adding pamidronate tonight in hopes it may help temporarily. She will be on denosumab longer term as outpatient. I do not favor pelvic XRT at this point as it would still leave [place] with the fractures and pain. In addition it would sterilize most of the marrow-bearing bones with subsequent cytopenias. Focal radiation in future certainly remains an option.  Again I am thankful to IR and ortho for their help to this patient and her family.  Chauncey Cruel, MD 01/16/2021  6:42 PM Medical Oncology and Hematology [facility] [address] [place], [place] [address] Tel. [phone]    Fax. [phone]

## 2021-01-16 NOTE — Progress Notes (Signed)
PROGRESS NOTE    Monica [name]  EYC:[id number] DOB: Jul 17, 1970 DOA: 01/14/2021 PCP: [name], PA-C    No chief complaint on file.   Brief Narrative:  Patient 50 year old female history of metastatic breast cancer being followed by Dr. Jana Hakim presented with increasing pain mostly in the hips and lower extremities with difficulty ambulating.  Pain very severe that patient had to use a walker.  Patient admitted for pain control.  Patient also due to start Herceptin soon and 2D echo ordered.   Assessment & Plan:   Principal Problem:   Cancer related pain Active Problems:   Malignant neoplasm of upper-inner quadrant of right breast in female, estrogen receptor positive (HCC)   Bone metastases (HCC)   Pain   Hypercalcemia   1 cancer related pain secondary to history of widely metastatic breast cancer. -Patient with significant lower extremity pain leading to difficulty ambulating.  Patient lives on the second level with no elevator at home and will be unsafe to discharge with difficulty ambulating and pain uncontrolled. -CT pelvis done with new interval superior endplate compression fracture deformity at L4 with 20% loss of height centrally, no retropulsion, this reflects pathologic fracture.  Additional multifocal lytic/sclerotic osseous metastases in the visualized axial and appendicular skeleton, unchanged. -Patient still requiring IV Dilaudid pain medication every 2-3 hours per MAR. -Continue as needed oxycodone. -Continue scheduled ibuprofen 600 mg 3 times daily.   -Increase Dilaudid to 1 mg every 3 hours as needed severe pain.  -Place on Neurontin 300 mg 3 times daily.  -Oncology following and order placed for IR to assess and consider for kyphoplasty as it is felt this may help improve patient's pain management. -Patient currently n.p.o., per RN patient for possible kyphoplasty today. -Palliative care consultation pending for symptom management/pain  management. -PT/OT. -Oncology following.  2.  Metastatic breast cancer -2D echo with normal EF,NWMA -Per oncology.  3.  Anemia of chronic disease -Likely anemia of chronic disease. -Patient with no overt bleeding. -Anemia panel consistent with anemia of chronic disease.   -Hemoglobin stable at 12.2.   -Transfusion threshold hemoglobin < 7.  4.  Hypercalcemia -Likely hypercalcemia of malignancy. -Patient with metastatic breast cancer. -Placed on IV fluids. -Check a magnesium, phosphorus levels -Repeat labs in the morning, if hypercalcemia is worsening may consider bisphosphonate. -Oncology following.   DVT prophylaxis: SCDs Code Status: Full Family Communication: Updated patient.  No family at bedside. Disposition:   Status is: Inpatient  The patient will require care spanning > 2 midnights and should be moved to inpatient because: Ongoing active pain requiring inpatient pain management  Dispo: The patient is from: Home              Anticipated d/c is to:  TBD              Patient currently is not medically stable to d/c.   Difficult to place patient No       Consultants:  Oncology: Dr. Jana Hakim. IR pending  Procedures:  CT abdomen and pelvis 01/14/2021 2D echo 01/14/2021  Antimicrobials:  None   Subjective: Patient sitting up in bed.  States no significant change with pain in bilateral lower extremities.  Still requiring IV Dilaudid every 2-3 hours as noted on MAR.  No chest pain.  No shortness of breath.  No abdominal pain.  States has been n.p.o. since midnight in anticipation of possible kyphoplasty today.   Objective: Vitals:   01/15/21 0525 01/15/21 1606 01/15/21 2106 01/16/21 0528  BP: 127/84 Marland Kitchen)  141/96 (!) 142/81 132/79  Pulse: 86 85 89 91  Resp: 16 18 16 16   Temp: 98.1 F (36.7 C) 98.7 F (37.1 C) 98.2 F (36.8 C) 98.5 F (36.9 C)  TempSrc: Oral Oral Oral Oral  SpO2: 98% 97% 98% 95%  Weight:      Height:        Intake/Output Summary (Last  24 hours) at 01/16/2021 0946 Last data filed at 01/15/2021 1837 Gross per 24 hour  Intake 480 ml  Output --  Net 480 ml   Filed Weights   01/14/21 1740  Weight: 78 kg    Examination:  General exam: : NAD Respiratory system: CTA B anterior lung fields.  No wheezes, no rhonchi.  Speaking in full sentences.  Normal respiratory effort. Cardiovascular system: Regular rate and rhythm no murmurs rubs or gallops.  No JVD.  No lower extremity edema.  Gastrointestinal system: Abdomen soft, nontender, nondistended, positive bowel sounds.  No rebound.  No guarding. Central nervous system: Alert and oriented. No focal neurological deficits. Extremities: Symmetric 5 x 5 power. Skin: No rashes, lesions or ulcers Psychiatry: Judgement and insight appear normal. Mood & affect appropriate.   Data Reviewed: I have personally reviewed following labs and imaging studies  CBC: Recent Labs  Lab 01/14/21 1255 01/16/21 0516  WBC 10.6* 7.7  NEUTROABS 8.3* 5.5  HGB 11.9* 12.2  HCT 37.3 38.9  MCV 94.4 95.1  PLT 270 425    Basic Metabolic Panel: Recent Labs  Lab 01/14/21 1255 01/16/21 0516  NA 137 139  K 3.6 4.0  CL 97* 98  CO2 29 30  GLUCOSE 101* 107*  BUN 18 15  CREATININE 0.84 0.85  CALCIUM 10.5* 11.1*    GFR: Estimated Creatinine Clearance: 83.5 mL/min (by C-G formula based on SCr of 0.85 mg/dL).  Liver Function Tests: Recent Labs  Lab 01/14/21 1255  AST 82*  ALT 73*  ALKPHOS 166*  BILITOT 0.8  PROT 7.3  ALBUMIN 3.8    CBG: No results for input(s): GLUCAP in the last 168 hours.   Recent Results (from the past 240 hour(s))  SARS CORONAVIRUS 2 (TAT 6-24 HRS) Nasopharyngeal Nasopharyngeal Swab     Status: None   Collection Time: 01/14/21  4:43 PM   Specimen: Nasopharyngeal Swab  Result Value Ref Range Status   SARS Coronavirus 2 NEGATIVE NEGATIVE Final    Comment: (NOTE) SARS-CoV-2 target nucleic acids are NOT DETECTED.  The SARS-CoV-2 RNA is generally detectable in  upper and lower respiratory specimens during the acute phase of infection. Negative results do not preclude SARS-CoV-2 infection, do not rule out co-infections with other pathogens, and should not be used as the sole basis for treatment or other patient management decisions. Negative results must be combined with clinical observations, patient history, and epidemiological information. The expected result is Negative.  Fact Sheet for Patients: SugarRoll.be  Fact Sheet for Healthcare Providers: https://www.woods-mathews.com/  This test is not yet approved or cleared by the [place] FDA and  has been authorized for detection and/or diagnosis of SARS-CoV-2 by FDA under an Emergency Use Authorization (EUA). This EUA will remain  in effect (meaning this test can be used) for the duration of the COVID-19 declaration under Se ction 564(b)(1) of the Act, 21 U.S.C. section 360bbb-3(b)(1), unless the authorization is terminated or revoked sooner.  Performed at [facility], [place] [address], [place], [place] [address]          Radiology Studies: CT PELVIS WO CONTRAST  Result Date: 01/14/2021 CLINICAL DATA:  Breast cancer, pelvic pain, known osseous metastases EXAM: CT PELVIS WITHOUT CONTRAST TECHNIQUE: Multidetector CT imaging of the pelvis was performed following the standard protocol without intravenous contrast. COMPARISON:  CT abdomen/pelvis dated 11/30/2020 FINDINGS: Urinary Tract:  Bladder is within normal limits. Bowel:  Visualized bowel is unremarkable. Vascular/Lymphatic: No evidence of aneurysm. No suspicious pelvic lymphadenopathy. Reproductive:  Status post hysterectomy. Bilateral ovaries are within normal limits. Other:  No pelvic ascites. Subcutaneous injection sites along the left lower abdominal wall (series 6/image 68). Musculoskeletal: Interval superior endplate compression fracture deformity at L4 (sagittal image 102), with  20% loss of height centrally, but no retropulsion. This reflects a pathologic fracture with central lytic lesion. Otherwise, this appearance is overall unchanged. Multifocal lytic/sclerotic metastases in the lower lumbar spine (series 3/image 11), sacrum series 3/image 35), left iliac bone (series 3/image 43), right femoral head (series 3/image 91), left femoral head/neck (series 3/image 101), and bilateral inferior pubic rami/parasymphyseal regions (series 3/image 106). IMPRESSION: Interval superior endplate compression fracture deformity at L4, with 20% loss of height centrally, but no retropulsion. This reflects a pathologic fracture. Additional multifocal lytic/sclerotic osseous metastases in the visualized axial and appendicular skeleton, unchanged. Electronically Signed   By: Julian Hy M.D.   On: 01/14/2021 19:59   ECHOCARDIOGRAM COMPLETE  Result Date: 01/14/2021    ECHOCARDIOGRAM REPORT   Patient Name:   [facility] Date of Exam: 01/14/2021 Medical Rec #:  [id number]     Height:       66.0 in Accession #:    [id number]    Weight:       171.0 lb Date of Birth:  1970-08-16     BSA:          1.871 m Patient Age:    81 years      BP:           113/72 mmHg Patient Gender: F             HR:           94 bpm. Exam Location:  Outpatient Procedure: 2D Echo, Cardiac Doppler, Color Doppler and Strain Analysis Indications:    Chemo evaluation  History:        Patient has prior history of Echocardiogram examinations, most                 recent 06/14/2018. Signs/Symptoms:Breast cancer.  Sonographer:    Dustin Flock Referring Phys: [place]  1. Left ventricular ejection fraction, by estimation, is 60 to 65%. The left ventricle has normal function. The left ventricle has no regional wall motion abnormalities. Left ventricular diastolic parameters were normal.  2. Right ventricular systolic function is normal. The right ventricular size is normal. Tricuspid regurgitation signal is  inadequate for assessing PA pressure.  3. The mitral valve is grossly normal. No evidence of mitral valve regurgitation. No evidence of mitral stenosis.  4. The aortic valve is tricuspid. Aortic valve regurgitation is not visualized. No aortic stenosis is present.  5. The inferior vena cava is normal in size with greater than 50% respiratory variability, suggesting right atrial pressure of 3 mmHg. Conclusion(s)/Recommendation(s): Normal biventricular function without evidence of hemodynamically significant valvular heart disease. FINDINGS  Left Ventricle: Left ventricular ejection fraction, by estimation, is 60 to 65%. The left ventricle has normal function. The left ventricle has no regional wall motion abnormalities. Global longitudinal strain performed but not reported based on interpreter judgement due to suboptimal tracking.  The left ventricular internal cavity size was normal in size. There is no left ventricular hypertrophy. Left ventricular diastolic parameters were normal. Right Ventricle: The right ventricular size is normal. No increase in right ventricular wall thickness. Right ventricular systolic function is normal. Tricuspid regurgitation signal is inadequate for assessing PA pressure. Left Atrium: Left atrial size was normal in size. Right Atrium: Right atrial size was normal in size. Pericardium: Trivial pericardial effusion is present. Presence of pericardial fat pad. Mitral Valve: The mitral valve is grossly normal. No evidence of mitral valve regurgitation. No evidence of mitral valve stenosis. Tricuspid Valve: The tricuspid valve is grossly normal. Tricuspid valve regurgitation is trivial. No evidence of tricuspid stenosis. Aortic Valve: The aortic valve is tricuspid. Aortic valve regurgitation is not visualized. No aortic stenosis is present. Pulmonic Valve: The pulmonic valve was grossly normal. Pulmonic valve regurgitation is not visualized. No evidence of pulmonic stenosis. Aorta: The aortic  root and ascending aorta are structurally normal, with no evidence of dilitation. Venous: The right lower pulmonary vein is normal. The inferior vena cava is normal in size with greater than 50% respiratory variability, suggesting right atrial pressure of 3 mmHg. IAS/Shunts: The atrial septum is grossly normal.  LEFT VENTRICLE PLAX 2D LVIDd:         4.00 cm     Diastology LVIDs:         2.50 cm     LV e' medial:    5.00 cm/s LV PW:         0.90 cm     LV E/e' medial:  15.9 LV IVS:        1.10 cm     LV e' lateral:   6.64 cm/s LVOT diam:     2.00 cm     LV E/e' lateral: 12.0 LV SV:         65 LV SV Index:   35 LVOT Area:     3.14 cm  LV Volumes (MOD) LV vol d, MOD A4C: 72.7 ml LV vol s, MOD A4C: 29.8 ml LV SV MOD A4C:     72.7 ml RIGHT VENTRICLE RV Basal diam:  2.60 cm RV S prime:     14.60 cm/s TAPSE (M-mode): 2.2 cm LEFT ATRIUM             Index       RIGHT ATRIUM           Index LA diam:        2.40 cm 1.28 cm/m  RA Area:     11.40 cm LA Vol (A2C):   27.8 ml 14.86 ml/m RA Volume:   23.30 ml  12.45 ml/m LA Vol (A4C):   30.2 ml 16.14 ml/m LA Biplane Vol: 29.2 ml 15.61 ml/m  AORTIC VALVE LVOT Vmax:   110.00 cm/s LVOT Vmean:  72.300 cm/s LVOT VTI:    0.208 m  AORTA Ao Root diam: 2.80 cm MITRAL VALVE MV Area (PHT): 3.68 cm    SHUNTS MV Decel Time: 206 msec    Systemic VTI:  0.21 m MV E velocity: 79.50 cm/s  Systemic Diam: 2.00 cm MV A velocity: 69.40 cm/s MV E/A ratio:  1.15 Eleonore Chiquito MD Electronically signed by Eleonore Chiquito MD Signature Date/Time: 01/14/2021/7:04:07 PM    Final         Scheduled Meds:  capecitabine  1,500 mg Oral BID AC   gabapentin  300 mg Oral TID   ibuprofen  600 mg Oral TID  ondansetron (ZOFRAN) IV  4 mg Intravenous TID AC & HS   pantoprazole  40 mg Oral Daily   polyethylene glycol  17 g Oral BID   senna-docusate  1 tablet Oral BID   Continuous Infusions:   LOS: 2 days    Time spent: 35 minutes    Irine Seal, MD [facility]   To contact the  attending provider between 7A-7P or the covering provider during after hours 7P-7A, please log into the web site [email or web address] and access using universal  password for that web site. If you do not have the password, please call the hospital operator.  01/16/2021, 9:46 AM

## 2021-01-16 NOTE — Consult Note (Signed)
ORTHOPAEDIC CONSULTATION  REQUESTING PHYSICIAN: Eugenie Filler, MD  Chief Complaint: Right hip pain  HPI: Monica [name] is a very pleasant 50 y.o. female who was recently diagnosed with metastatic breast cancer a couple months ago.  She initially underwent treatment for breast cancer couple years ago which was felt to be isolated.  Unfortunately she had acute onset of severe pain throughout her body in May and was found to have widespread metastatic disease including bony mets.  She had severe right hip pain with weightbearing over the weekend that required admission to the hospital for pain control.  She was found to have multiple bony mets to the pelvis and sacrum as well as bilateral femoral heads.  She was also found to have a pathologic fracture of L4.  She was supposed to undergo kyphoplasty but after evaluation by Dr. Amelia Jo it was felt that her pain was more related to the mets of the right hip.  MRI of the pelvis has been ordered and pending.  The patient reports severe pain with any weightbearing to the right hip and radiation of the pain down into the thigh.  Denies any classic radicular symptoms.  Pain is relatively mild when in bed.  Orthopedics was asked to evaluate the patient for treatment options.  Past Medical History:  Diagnosis Date   Arthritis    lower back   Cancer [facility])    right breast cancer   Family history of breast cancer    Personal history of chemotherapy    Personal history of radiation therapy    Past Surgical History:  Procedure Laterality Date   ABDOMINAL HYSTERECTOMY     APPLICATION OF CRANIAL NAVIGATION N/A 12/06/2020   Procedure: APPLICATION OF CRANIAL NAVIGATION;  Surgeon: Judith Part, MD;  Location: [place];  Service: Neurosurgery;  Laterality: N/A;  RM 20   BREAST LUMPECTOMY Right    BREAST LUMPECTOMY WITH RADIOACTIVE SEED AND SENTINEL LYMPH NODE BIOPSY Right 02/16/2018   Procedure: RIGHT BREAST LUMPECTOMY WITH RADIOACTIVE SEED AND  SENTINEL LYMPH NODE BIOPSY;  Surgeon: [name], MD;  Location: [place];  Service: General;  Laterality: Right;   CRANIOTOMY Right 12/06/2020   Procedure: Right sided awake craniotomy for tumor resection;  Surgeon: Judith Part, MD;  Location: [place];  Service: Neurosurgery;  Laterality: Right;   HYMENECTOMY     IR US GUIDE BX ASP/DRAIN  12/02/2020   PORTACATH PLACEMENT Right 02/16/2018   Procedure: INSERTION PORT-A-CATH;  Surgeon: [name], MD;  Location: [place];  Service: General;  Laterality: Right;   RE-EXCISION OF BREAST LUMPECTOMY Right 02/22/2018   Procedure: RE-EXCISION OF RIGHT  BREAST LUMPECTOMY;  Surgeon: [name], MD;  Location: [place];  Service: General;  Laterality: Right;   REPAIR VAGINAL CUFF N/A 02/07/2017   Procedure: REPAIR VAGINAL CUFF;  Surgeon: Ena Dawley, MD;  Location: [facility];  Service: Gynecology;  Laterality: N/A;   ROBOTIC ASSISTED TOTAL HYSTERECTOMY WITH SALPINGECTOMY Left 01/20/2017   Procedure: ROBOTIC ASSISTED TOTAL HYSTERECTOMY WITH SALPINGECTOMY;  Surgeon: [name], MD;  Location: [facility];  Service: Gynecology;  Laterality: Left;   Social History   Socioeconomic History   Marital status: Legally Separated    Spouse name: Not on file   Number of children: Not on file   Years of education: Not on file   Highest education level: Not on file  Occupational History   Not on file  Tobacco Use   Smoking status: Never  Smokeless tobacco: Never  Vaping Use   Vaping Use: Some days  Substance and Sexual Activity   Alcohol use: Yes    Comment: occ   Drug use: No   Sexual activity: Not Currently    Birth control/protection: Surgical  Other Topics Concern   Not on file  Social History Narrative   Not on file   Social Determinants of Health   Financial Resource Strain: Not on file  Food Insecurity: Not on file  Transportation Needs: Not on file  Physical Activity: Not on  file  Stress: Not on file  Social Connections: Not on file   Family History  Problem Relation Age of Onset   Lung cancer Maternal Grandfather    Esophageal cancer Paternal Grandfather 18   Breast cancer Cousin 39   - negative except otherwise stated in the family history section No Known Allergies Prior to Admission medications   Medication Sig Start Date End Date Taking? Authorizing Provider  acetaminophen (TYLENOL) 500 MG tablet Take 500 mg by mouth 3 (three) times daily.   Yes [provider]  b complex vitamins capsule Take 1 capsule by mouth in the morning and at bedtime.   Yes [provider]  BIOTIN PO Take 1 capsule by mouth in the morning and at bedtime.   Yes [provider]  capecitabine (XELODA) 500 MG tablet Take 3 tablets (1,500 mg total) by mouth 2 (two) times daily before a meal. Take for 14 days on, 7 days off. Repeat every 21 days. Patient taking differently: Take 1,500 mg by mouth 2 (two) times daily before a meal. Take 1,500 mg by mouth two times a day with meals- 14 days on, 7 days off and repeat every 21 days 01/01/21  Yes Magrinat, [name], MD  cholecalciferol (VITAMIN D3) 25 MCG (1000 UNIT) tablet Take 1 tablet (1,000 Units total) by mouth daily. 06/20/20  Yes Magrinat, [name], MD  dexamethasone (DECADRON) 4 MG tablet Take 0.5 tablets (2 mg total) by mouth 2 (two) times daily with breakfast and lunch. Patient taking differently: Take 2 mg by mouth every other day. 12/11/20  Yes Judith Part, MD  ferrous sulfate 325 (65 FE) MG tablet Take 325 mg by mouth daily with breakfast.   Yes [provider]  fluconazole (DIFLUCAN) 100 MG tablet Take 1 tablet (100 mg total) by mouth daily. Patient taking differently: Take 100 mg by mouth See admin instructions. Take 100 mg by mouth daily as directed for thrush 01/08/21  Yes Magrinat, [name], MD  lidocaine (LIDODERM) 5 % Place 1 patch onto the skin daily. Remove & Discard patch within 12  hours or as directed by MD Patient taking differently: Place 1 patch onto the skin daily as needed (for pain- Remove & Discard patch within 12 hours or as directed by MD). 12/10/20  Yes Ostergard, Joyice Faster, MD  naproxen sodium (ALEVE) 220 MG tablet Take 220 mg by mouth 3 (three) times daily.   Yes [provider]  omeprazole (PRILOSEC) 40 MG capsule Take one tablet twice a day for 7 days, then daily Patient taking differently: Take 40 mg by mouth daily before supper. 12/31/20  Yes Magrinat, [name], MD  ondansetron (ZOFRAN ODT) 4 MG disintegrating tablet Take 1 tablet (4 mg total) by mouth every 8 (eight) hours as needed for nausea or vomiting. Patient taking differently: Take 4 mg by mouth See admin instructions. Dissolve 4 mg in the mouth three times a day and an  additional 4 mg at bedtime as needed for nausea/vomiting 12/31/20  Yes Magrinat, [name], MD  prochlorperazine (COMPAZINE) 10 MG tablet Take 1 tablet (10 mg total) by mouth every 6 (six) hours as needed (Nausea or vomiting). Patient taking differently: Take 10 mg by mouth every 6 (six) hours. 12/31/20  Yes Magrinat, [name], MD  promethazine (PHENERGAN) 25 MG suppository Place 1 suppository (25 mg total) rectally every 6 (six) hours as needed for nausea or vomiting. 12/28/20  Yes Walisiewicz, Kaitlyn E, PA-C  traMADol (ULTRAM) 50 MG tablet Take 1-2 tablets (50-100 mg total) by mouth every 6 (six) hours as needed. Patient taking differently: Take 100 mg by mouth every 6 (six) hours. 01/06/21  Yes Magrinat, [name], MD  tucatinib (TUKYSA) 150 MG tablet Take 1 tablet (150 mg total) by mouth 2 (two) times daily. Take every 12 hrs at the same time each day with or without a meal. 01/01/21   Magrinat, [name], MD   CT PELVIS WO CONTRAST  Addendum Date: 01/16/2021   ADDENDUM REPORT: 01/16/2021 12:44 ADDENDUM: Upon further review, there has been interval development of significant metastatic disease to the bilateral proximal femurs compared  to the relatively recent prior study dated 11/30/2020. On the right, several cortical lucencies are visible in the region of the subcapital and mid femoral neck highly concerning for pathologic fracture. Metastatic disease also extends into the femoral head itself. On the left, and anteriorly located lytic lesion appears to result in cortical breakthrough along the anterior surface of the proximal femoral neck (see sagittal images 153 in 154 of series 8). These findings may represent the primary source of the patient's progressive bilateral hip pain. These results will be called to the ordering clinician or representative by the Radiologist Assistant, and communication documented in the PACS or [employer]. Electronically Signed   By: Jacqulynn Cadet M.D.   On: 01/16/2021 12:44   Result Date: 01/16/2021 CLINICAL DATA:  Breast cancer, pelvic pain, known osseous metastases EXAM: CT PELVIS WITHOUT CONTRAST TECHNIQUE: Multidetector CT imaging of the pelvis was performed following the standard protocol without intravenous contrast. COMPARISON:  CT abdomen/pelvis dated 11/30/2020 FINDINGS: Urinary Tract:  Bladder is within normal limits. Bowel:  Visualized bowel is unremarkable. Vascular/Lymphatic: No evidence of aneurysm. No suspicious pelvic lymphadenopathy. Reproductive:  Status post hysterectomy. Bilateral ovaries are within normal limits. Other:  No pelvic ascites. Subcutaneous injection sites along the left lower abdominal wall (series 6/image 68). Musculoskeletal: Interval superior endplate compression fracture deformity at L4 (sagittal image 102), with 20% loss of height centrally, but no retropulsion. This reflects a pathologic fracture with central lytic lesion. Otherwise, this appearance is overall unchanged. Multifocal lytic/sclerotic metastases in the lower lumbar spine (series 3/image 11), sacrum series 3/image 35), left iliac bone (series 3/image 43), right femoral head (series 3/image 91), left  femoral head/neck (series 3/image 101), and bilateral inferior pubic rami/parasymphyseal regions (series 3/image 106). IMPRESSION: Interval superior endplate compression fracture deformity at L4, with 20% loss of height centrally, but no retropulsion. This reflects a pathologic fracture. Additional multifocal lytic/sclerotic osseous metastases in the visualized axial and appendicular skeleton, unchanged. Electronically Signed: By: Julian Hy M.D. On: 01/14/2021 19:59   - pertinent xrays, CT, MRI studies were reviewed and independently interpreted  Positive ROS: All other systems have been reviewed and were otherwise negative with the exception of those mentioned in the HPI and as above.  Physical Exam: General: No acute distress Cardiovascular: No pedal edema Respiratory: No cyanosis, no use  of accessory musculature GI: No organomegaly, abdomen is soft and non-tender Skin: No lesions in the area of chief complaint Neurologic: Sensation intact distally Psychiatric: Patient is at baseline mood and affect Lymphatic: No axillary or cervical lymphadenopathy  MUSCULOSKELETAL:  Right lower extremity exam shows strong pulses and sensation in the foot.  She has mild to moderate discomfort with logroll with both external and internal rotation.  Internal extra rotation of the leg with the hip at 90 degrees is also well-tolerated.  She does not have significant pain to lateral palpation of the greater trochanter.  Lateral compression of the pelvis produces moderate pain.  Palpation of the anterior pelvic brim produces moderate pain.  The sacrum is mildly tender to palpation.  No focal motor or sensory deficits.  Assessment: Multiple bony mets to the right femoral head and pelvis Metastatic breast cancer  Plan: I have reviewed and explained my clinical and radiographic findings with the patient and her mother in detail.  She has multiple lytic mets to the right femoral head, neck and possibly the  intertrochanteric region according to the CT scan.  She may have a small nondisplaced pathologic fracture of the femoral neck as well.  Her symptoms and pain are consistent with radiographic findings.  I agree with the MRI of the pelvis to show the true extent of the metastatic lesions.  This will also help [place] to determine surgical options if any.  I explained that there is a chance that she may need referral to a tertiary care center that has an orthopedic oncologist on staff to perform particular types of surgeries.  I have ordered an x-ray of the right femur to rule out metastases.  I will follow-up with the patient once the MRI has been completed to discuss options going forward.  All questions answered.  Thank you for the consult and the opportunity to see Ms. Era Bumpers, MD [facility] 3:34 PM

## 2021-01-17 ENCOUNTER — Encounter: Payer: Self-pay | Admitting: Oncology

## 2021-01-17 DIAGNOSIS — M84553G Pathological fracture in neoplastic disease, unspecified femur, subsequent encounter for fracture with delayed healing: Secondary | ICD-10-CM

## 2021-01-17 DIAGNOSIS — C7951 Secondary malignant neoplasm of bone: Secondary | ICD-10-CM

## 2021-01-17 LAB — RENAL FUNCTION PANEL
Albumin: 3.2 g/dL — ABNORMAL LOW (ref 3.5–5.0)
Anion gap: 10 (ref 5–15)
BUN: 13 mg/dL (ref 6–20)
CO2: 24 mmol/L (ref 22–32)
Calcium: 10.9 mg/dL — ABNORMAL HIGH (ref 8.9–10.3)
Chloride: 101 mmol/L (ref 98–111)
Creatinine, Ser: 0.76 mg/dL (ref 0.44–1.00)
GFR, Estimated: >60 mL/min (ref >60)
Glucose, Bld: 166 mg/dL — ABNORMAL HIGH (ref 70–99)
Phosphorus: 3.5 mg/dL (ref 2.5–4.6)
Potassium: 3.4 mmol/L — ABNORMAL LOW (ref 3.5–5.1)
Sodium: 135 mmol/L (ref 135–145)

## 2021-01-17 LAB — CBC WITH DIFFERENTIAL/PLATELET
Abs Immature Granulocytes: 0.12 10*3/uL — ABNORMAL HIGH (ref 0.00–0.07)
Basophils Absolute: 0.1 10*3/uL (ref 0.0–0.1)
Basophils Relative: 1 %
Eosinophils Absolute: 0.2 10*3/uL (ref 0.0–0.5)
Eosinophils Relative: 3 %
HCT: 35.7 % — ABNORMAL LOW (ref 36.0–46.0)
Hemoglobin: 11.4 g/dL — ABNORMAL LOW (ref 12.0–15.0)
Immature Granulocytes: 2 %
Lymphocytes Relative: 7 %
Lymphs Abs: 0.5 10*3/uL — ABNORMAL LOW (ref 0.7–4.0)
MCH: 30.5 pg (ref 26.0–34.0)
MCHC: 31.9 g/dL (ref 30.0–36.0)
MCV: 95.5 fL (ref 80.0–100.0)
Monocytes Absolute: 0.5 10*3/uL (ref 0.1–1.0)
Monocytes Relative: 7 %
Neutro Abs: 5.6 10*3/uL (ref 1.7–7.7)
Neutrophils Relative %: 80 %
Platelets: 234 10*3/uL (ref 150–400)
RBC: 3.74 MIL/uL — ABNORMAL LOW (ref 3.87–5.11)
RDW: 14.5 % (ref 11.5–15.5)
WBC: 7 10*3/uL (ref 4.0–10.5)
nRBC: 0 % (ref 0.0–0.2)

## 2021-01-17 LAB — MAGNESIUM: Magnesium: 1.7 mg/dL (ref 1.7–2.4)

## 2021-01-17 MED ORDER — HYDROMORPHONE HCL 1 MG/ML IJ SOLN
1.0000 mg | INTRAMUSCULAR | Status: DC
  Administered 2021-01-17 – 2021-01-21 (×35): 1 mg via INTRAVENOUS
  Filled 2021-01-17 (×35): qty 1

## 2021-01-17 MED ORDER — SENNOSIDES-DOCUSATE SODIUM 8.6-50 MG PO TABS
2.0000 | ORAL_TABLET | Freq: Two times a day (BID) | ORAL | Status: DC
  Administered 2021-01-17 – 2021-01-21 (×7): 2 via ORAL
  Filled 2021-01-17 (×8): qty 2

## 2021-01-17 MED ORDER — MAGNESIUM SULFATE 2 GM/50ML IV SOLN
2.0000 g | Freq: Once | INTRAVENOUS | Status: AC
  Administered 2021-01-17: 2 g via INTRAVENOUS
  Filled 2021-01-17: qty 50

## 2021-01-17 MED ORDER — ENOXAPARIN SODIUM 40 MG/0.4ML IJ SOSY
40.0000 mg | PREFILLED_SYRINGE | INTRAMUSCULAR | Status: DC
  Administered 2021-01-17 – 2021-01-20 (×4): 40 mg via SUBCUTANEOUS
  Filled 2021-01-17 (×4): qty 0.4

## 2021-01-17 NOTE — Plan of Care (Signed)
Pt able to sleep comfortably; dilaudid administered q3hr for severe back and right leg pain; pt ambulated to bathroom with walker; no s/s of acute distress reported or observed; call light and cell phone within reach with bed at lowest position for safety.

## 2021-01-17 NOTE — Progress Notes (Signed)
Monica [name]   DOB:July 26, 1970   QJ#:[id number]   LSL#:[id number]  Subjective:  Monica [name] reports that her pain is much better controlled today.  She received IV Dilaudid every 3 hours overnight.  She felt this worked better than having to call for it once her pain was out of control.  She is not having any nausea or vomiting today.  Still having some difficulty with constipation.  No family at the bedside.  Objective: White woman examined in bed Vitals:   01/16/21 2125 01/17/21 0625  BP: 117/76 139/88  Pulse: 82 87  Resp: 14 14  Temp: (!) 97.4 F (36.3 C) 98.6 F (37 C)  SpO2: 93% 93%    Body mass index is 27.75 kg/m. No intake or output data in the 24 hours ending 01/17/21 0745   CBG (last 3)  No results for input(s): GLUCAP in the last 72 hours.   Labs:  Lab Results  Component Value Date   WBC 7.7 01/16/2021   HGB 12.2 01/16/2021   HCT 38.9 01/16/2021   MCV 95.1 01/16/2021   PLT 278 01/16/2021   NEUTROABS 5.5 01/16/2021    _0 @  Urine Studies No results for input(s): UHGB, CRYS in the last 72 hours.  Invalid input(s): UACOL, UAPR, USPG, UPH, UTP, UGL, UKET, UBIL, UNIT, UROB, ULEU, UEPI, UWBC, URBC, UBAC, CAST, UCOM, BILUA  Basic Metabolic Panel: Recent Labs  Lab 01/14/21 1255 01/16/21 0516  NA 137 139  K 3.6 4.0  CL 97* 98  CO2 29 30  GLUCOSE 101* 107*  BUN 18 15  CREATININE 0.84 0.85  CALCIUM 10.5* 11.1*  MG  --  1.8  PHOS  --  4.5   GFR Estimated Creatinine Clearance: 83.5 mL/min (by C-G formula based on SCr of 0.85 mg/dL). Liver Function Tests: Recent Labs  Lab 01/14/21 1255  AST 82*  ALT 73*  ALKPHOS 166*  BILITOT 0.8  PROT 7.3  ALBUMIN 3.8   No results for input(s): LIPASE, AMYLASE in the last 168 hours. No results for input(s): AMMONIA in the last 168 hours. Coagulation profile No results for input(s): INR, PROTIME in the last 168 hours.  CBC: Recent Labs  Lab 01/14/21 1255 01/16/21 0516  WBC 10.6* 7.7  NEUTROABS 8.3* 5.5   HGB 11.9* 12.2  HCT 37.3 38.9  MCV 94.4 95.1  PLT 270 278   Cardiac Enzymes: No results for input(s): CKTOTAL, CKMB, CKMBINDEX, TROPONINI in the last 168 hours. BNP: Invalid input(s): POCBNP CBG: No results for input(s): GLUCAP in the last 168 hours. D-Dimer No results for input(s): DDIMER in the last 72 hours. Hgb A1c No results for input(s): HGBA1C in the last 72 hours. Lipid Profile No results for input(s): CHOL, HDL, LDLCALC, TRIG, CHOLHDL, LDLDIRECT in the last 72 hours. Thyroid function studies No results for input(s): TSH, T4TOTAL, T3FREE, THYROIDAB in the last 72 hours.  Invalid input(s): FREET3 Anemia work up Recent Labs    01/16/21 0516  VITAMINB12 1,608*  FOLATE 9.7  FERRITIN 2,032*  TIBC 361  IRON 69  RETICCTPCT 2.1   Microbiology Recent Results (from the past 240 hour(s))  SARS CORONAVIRUS 2 (TAT 6-24 HRS) Nasopharyngeal Nasopharyngeal Swab     Status: None   Collection Time: 01/14/21  4:43 PM   Specimen: Nasopharyngeal Swab  Result Value Ref Range Status   SARS Coronavirus 2 NEGATIVE NEGATIVE Final    Comment: (NOTE) SARS-CoV-2 target nucleic acids are NOT DETECTED.  The SARS-CoV-2 RNA is generally detectable in upper and lower respiratory  specimens during the acute phase of infection. Negative results do not preclude SARS-CoV-2 infection, do not rule out co-infections with other pathogens, and should not be used as the sole basis for treatment or other patient management decisions. Negative results must be combined with clinical observations, patient history, and epidemiological information. The expected result is Negative.  Fact Sheet for Patients: SugarRoll.be  Fact Sheet for Healthcare Providers: https://www.woods-mathews.com/  This test is not yet approved or cleared by the [place] FDA and  has been authorized for detection and/or diagnosis of SARS-CoV-2 by FDA under an Emergency Use  Authorization (EUA). This EUA will remain  in effect (meaning this test can be used) for the duration of the COVID-19 declaration under Se ction 564(b)(1) of the Act, 21 U.S.C. section 360bbb-3(b)(1), unless the authorization is terminated or revoked sooner.  Performed at [facility], [place] [address], [place], [place] [address]       Studies:  MR PELVIS W WO CONTRAST  Result Date: 01/17/2021 CLINICAL DATA:  Metastatic disease, evaluate for femur fracture EXAM: MRI PELVIS WITHOUT AND WITH CONTRAST MRI FEMUR WITHOUT AND WITH CONTRAST TECHNIQUE: Multiplanar multisequence MR imaging of the pelvis was performed both before and after administration of intravenous contrast. Multiplanar multisequence MR imaging of the femur was performed both before and after administration of intravenous contrast. CONTRAST:  40m GADAVIST GADOBUTROL 1 MMOL/ML IV SOLN COMPARISON:  CT pelvis 01/14/2021 FINDINGS: Bones: Numerous abnormal enhancing bone lesions in the lower lumbar vertebral bodies, sacrum, ilium, bilateral acetabulum, and bilateral proximal femurs. Abnormal bone lesion encompasses nearly the entirety of the left side of the sacrum and ilium with possible nondisplaced pathologic fracture of the left sacral ala. Abnormal bone lesion in the anterior left femoral neck with focal severe cortical destruction placing the patient at high risk for a pathologic fracture. Abnormal bone lesion encompasses nearly the entirety of the right femoral head and femoral neck with severe cortical destruction of the anterior femoral head-neck junction placing the patient at high risk for a pathologic fracture. Although no acute fracture is identified on this MRI, there is cortical irregularity along the recent CT of the pelvis performed on 01/14/2021 concerning for a nondisplaced pathologic fracture. Abnormal bone lesions throughout the inferior pubic rami bilaterally. Pathologic nondisplaced fracture of the left inferior pubic  ramus. Abnormal bone lesions in the right distal femoral diaphysis and metaphysis with cortical destruction of the posterolateral margin. At small abnormal bone lesion in the mid left femoral diaphysis. Abnormal bone lesions in the left distal femoral metaphysis. No other acute fracture or dislocation. Normal sacrum and sacroiliac joints. No SI joint widening or erosive changes. Articular cartilage and labrum Articular cartilage:  No chondral defect. Labrum: Grossly intact, but evaluation is limited by lack of intraarticular fluid. Joint or bursal effusion Joint effusion: Large right hip joint effusion. No left hip joint effusion. No right SI joint effusion. Small left SI joint effusion. Bursae: No bursa formation. Muscles and tendons Flexors: Normal. Extensors: Normal. Abductors: Normal. Adductors: Normal. Gluteals: Mild edema in the left gluteus minimus muscle likely reflecting mild muscle strain. Hamstrings: Normal. Other findings No pelvic free fluid. No fluid collection or hematoma. No inguinal lymphadenopathy. No inguinal hernia. IMPRESSION: 1. Extensive osseous metastatic disease likely secondary to breast cancer involving the lower lumbar vertebral bodies, sacrum, ilium, bilateral acetabulum, and bilateral femurs as described above. 2. Abnormal bone lesion encompasses nearly the entirety of the right femoral head and femoral neck with severe cortical destruction of the anterior  femoral head-neck junction placing the patient at high risk for a pathologic fracture. Although no acute fracture is identified on this MRI, there is cortical irregularity on the recent CT of the pelvis performed on 01/14/2021 concerning for a nondisplaced pathologic fracture. 3. Pathologic nondisplaced fracture of the left inferior pubic ramus. 4. Abnormal bone lesion in the anterior left femoral neck with focal severe cortical destruction placing the patient at high risk for a pathologic fracture. 5. Abnormal bone lesion encompasses  nearly the entirety of the left side of the sacrum and ilium with possible nondisplaced pathologic fracture of the left sacral ala. 6. Large right hip joint effusion. 7. Mild edema in the left gluteus minimus muscle likely reflecting mild muscle strain. Electronically Signed   By: Kathreen Devoid   On: 01/17/2021 07:34   MR FEMUR RIGHT W WO CONTRAST  Result Date: 01/17/2021 CLINICAL DATA:  Metastatic disease, evaluate for femur fracture EXAM: MRI PELVIS WITHOUT AND WITH CONTRAST MRI FEMUR WITHOUT AND WITH CONTRAST TECHNIQUE: Multiplanar multisequence MR imaging of the pelvis was performed both before and after administration of intravenous contrast. Multiplanar multisequence MR imaging of the femur was performed both before and after administration of intravenous contrast. CONTRAST:  42m GADAVIST GADOBUTROL 1 MMOL/ML IV SOLN COMPARISON:  CT pelvis 01/14/2021 FINDINGS: Bones: Numerous abnormal enhancing bone lesions in the lower lumbar vertebral bodies, sacrum, ilium, bilateral acetabulum, and bilateral proximal femurs. Abnormal bone lesion encompasses nearly the entirety of the left side of the sacrum and ilium with possible nondisplaced pathologic fracture of the left sacral ala. Abnormal bone lesion in the anterior left femoral neck with focal severe cortical destruction placing the patient at high risk for a pathologic fracture. Abnormal bone lesion encompasses nearly the entirety of the right femoral head and femoral neck with severe cortical destruction of the anterior femoral head-neck junction placing the patient at high risk for a pathologic fracture. Although no acute fracture is identified on this MRI, there is cortical irregularity along the recent CT of the pelvis performed on 01/14/2021 concerning for a nondisplaced pathologic fracture. Abnormal bone lesions throughout the inferior pubic rami bilaterally. Pathologic nondisplaced fracture of the left inferior pubic ramus. Abnormal bone lesions in the  right distal femoral diaphysis and metaphysis with cortical destruction of the posterolateral margin. At small abnormal bone lesion in the mid left femoral diaphysis. Abnormal bone lesions in the left distal femoral metaphysis. No other acute fracture or dislocation. Normal sacrum and sacroiliac joints. No SI joint widening or erosive changes. Articular cartilage and labrum Articular cartilage:  No chondral defect. Labrum: Grossly intact, but evaluation is limited by lack of intraarticular fluid. Joint or bursal effusion Joint effusion: Large right hip joint effusion. No left hip joint effusion. No right SI joint effusion. Small left SI joint effusion. Bursae: No bursa formation. Muscles and tendons Flexors: Normal. Extensors: Normal. Abductors: Normal. Adductors: Normal. Gluteals: Mild edema in the left gluteus minimus muscle likely reflecting mild muscle strain. Hamstrings: Normal. Other findings No pelvic free fluid. No fluid collection or hematoma. No inguinal lymphadenopathy. No inguinal hernia. IMPRESSION: 1. Extensive osseous metastatic disease likely secondary to breast cancer involving the lower lumbar vertebral bodies, sacrum, ilium, bilateral acetabulum, and bilateral femurs as described above. 2. Abnormal bone lesion encompasses nearly the entirety of the right femoral head and femoral neck with severe cortical destruction of the anterior femoral head-neck junction placing the patient at high risk for a pathologic fracture. Although no acute fracture is identified on this MRI, there  is cortical irregularity on the recent CT of the pelvis performed on 01/14/2021 concerning for a nondisplaced pathologic fracture. 3. Pathologic nondisplaced fracture of the left inferior pubic ramus. 4. Abnormal bone lesion in the anterior left femoral neck with focal severe cortical destruction placing the patient at high risk for a pathologic fracture. 5. Abnormal bone lesion encompasses nearly the entirety of the left  side of the sacrum and ilium with possible nondisplaced pathologic fracture of the left sacral ala. 6. Large right hip joint effusion. 7. Mild edema in the left gluteus minimus muscle likely reflecting mild muscle strain. Electronically Signed   By: Kathreen Devoid   On: 01/17/2021 07:34   DG FEMUR PORT, MIN 2 VIEWS RIGHT  Result Date: 01/16/2021 CLINICAL DATA:  Right femur pain history of cancer with Mets EXAM: RIGHT FEMUR PORTABLE 2 VIEW COMPARISON:  CT 01/14/2021 FINDINGS: Focal permeative appearance of the distal femoral shaft, raises concern for metastatic disease. Lucent lesion seen on recent pelvic CT within the proximal femur is better seen on CT. No malalignment. Suspected right femoral neck fracture is also better seen on CT. IMPRESSION: 1. CT demonstrated metastatic lucent lesions on recent pelvic CT at the proximal right femur better demonstrated on CT. Possible subtle step-off at the femoral neck, may correspond to suspected femoral neck fracture on CT. 2. Focal permeative appearance of the distal shaft of the right femur concerning for metastatic focus. Electronically Signed   By: Donavan Foil M.D.   On: 01/16/2021 18:23    Assessment: 50 y.o.[place], [place] woman status post right breast upper inner quadrant biopsy 12/29/2017, for a clinical T1c N0, stage IA invasive ductal carcinoma, triple positive, with an MIB-1 of 80%.   (1) genetics testing 02/02/2018 though the CancerNext gene panel offered by [facility] showed no deleterious mutations in  APC, ATM, BARD1, BMPR1A,BRCA1, BRCA2, BRIP1, CDH1, CDK4, CDKN2A, CHEK2, DICER1, HOXB13, MLH1, MRE11A, MSH2, MSH6, MUTYH, NBN, NF1, PALB2, PMS2, POLD1, POLE, PTEN, RAD50, RAD51C, RAD51D, SMAD4, SMARCA4, STK11 and TP53 (sequencing and deletion/duplication); EPCAM and GREM1 (deletion/duplication only).             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               (a) Pertuzumab omitted after cycle 1 due to diarrhea.             (b) Gemcitabine substituted for docetaxel starting with cycle 5 due to peripheral neuropathy   (4) continued trastuzumab to total 6 months (through February 2020).             (a) echocardiogram 01/21/2018 showed an ejection fraction in the 55-60% range             (b) repeat echocardiogram 06/14/2018 showed an ejection fraction in the 60-65%   (5) adjuvant radiation 08/15/2018 - 09/28/2018  Site/dose:   The patient initially received a dose of 50.4 Gy in 28 fractions to the right breast using whole-breast tangent fields. This was delivered using a 3-D conformal technique. The  patient then received a boost to the seroma. This delivered an additional 10 Gy in 5 fractions using 12E, 9E electrons with a special teletherapy technique. The total dose was 60.4 Gy.    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, involving brain, liver, lungs, and bones (7) presenting 11/29/2020 with progressive back pain:             (a) non-contrast CT abd/pelvis (renal stone study) 11/29/2020 shows bone metastases             (b) CT chest/abd/pelvis with contrast 11/30/2020 shows multiple large liver masses, multiple pulmonary nodules, and widespread lytic bone lesions             (c) MRI brain shows at least 4 brain lesions, largest 3.8 cm             (d) total spinal MRI  11/30/2020 shows pathologic fractures at T9, T12, possibly L2, as well as numerous other spine lesions, but no epidural enhancement or cord compression             (e) liver biopsy 12/02/2020 shows adenocarcinoma, prognostic panel again triple positive             (f) pre-op repeat MRI 12/01/2020 shows additional brain lesions, at least 8 of which will be targets for SRS   (8) CNS treatment:             (a) [place] 12/05/2020             (b) surgery 12/06/2020 confirmed metastatic carcinoma, estrogen receptor positive, HER2 amplified, progesterone receptor negative, with an MIB-1 of 70%.   (9) XRT to spine 12/05/2020 through 12/23/2020 Site Technique Total Dose (Gy) Dose per Fx (Gy) Completed Fx Beam Energies  Thoracic Spine: Spine_T9-L3 Complex 30/30 3 10/10 15X  Pelvis: Pelvis_sacrum Complex 30/30 3 10/10 15X    (10) advanced directives-- plans to name son as HCPOA with her father as secondary   (26) started capecitabine 01/08/2021, to start trastuzumab 01/22/2021, tucatinib 01/29/2021  (A) echo 01/14/2021   (12) to start denosumab/Xgeva 01/22/2021, repeated every 6 weeks     Plan: MRI of the femur and pelvis have been reviewed which show extensive osseous metastatic disease involving multiple areas.  The MRI did not show an acute fracture, but recent CT was concerning for a nondisplaced pathologic fracture.  Awaiting further input from orthopedics and IR.  If it is felt that surgery or kyphoplasty would have a fair chance of helping relieve her pain, the patient would be agreeable to proceeding.  In terms of pain control, she had much better pain control when she routinely received her Dilaudid every 3 hours.  Therefore, I have changed the order for Dilaudid from every 3 hours as needed to every 3 hours as a scheduled dose.  She also continues to have issues with constipation.  She held off on taking some of her medication yesterday due to having several procedures scheduled.  Therefore, she  plans to take her MiraLAX as scheduled today and she has a Dulcolax suppository ordered.  I have increased her Senokot S from  1 tablet twice daily to 2 tablets twice daily.  Please call medical oncology over the weekend if questions arise.  Dr. Jana Hakim will return on Monday and will see the patient on that date.  Mikey Bussing, NP 01/17/2021  7:45 AM

## 2021-01-17 NOTE — Progress Notes (Signed)
PROGRESS NOTE    Monica [name]  UJW:[id number] DOB: 1970-12-13 DOA: 01/14/2021 PCP: [name], PA-C    No chief complaint on file.   Brief Narrative:  Patient 50 year old female history of metastatic breast cancer being followed by Dr. Jana Hakim presented with increasing pain mostly in the hips and lower extremities with difficulty ambulating.  Pain very severe that patient had to use a walker.  Patient admitted for pain control.  Patient also due to start Herceptin soon and 2D echo ordered.   Assessment & Plan:   Principal Problem:   Cancer related pain Active Problems:   Malignant neoplasm of upper-inner quadrant of right breast in female, estrogen receptor positive (HCC)   Bone metastases (HCC)   Pain   Hypercalcemia   1 cancer related pain secondary to history of widely metastatic breast cancer with osseous mets/concern for pathologic fracture -Patient with significant lower extremity pain leading to difficulty ambulating.  Patient lives on the second level with no elevator at home and will be unsafe to discharge with difficulty ambulating and pain uncontrolled. -CT pelvis done with new interval superior endplate compression fracture deformity at L4 with 20% loss of height centrally, no retropulsion, this reflects pathologic fracture.  Additional multifocal lytic/sclerotic osseous metastases in the visualized axial and appendicular skeleton, unchanged. -Patient still requiring IV Dilaudid pain medication every 3-4 hours per MAR. -Continue as needed oxycodone. -Continue scheduled ibuprofen 600 mg 3 times daily.   -Continue scheduled Neurontin and calcitonin nasal spray. -Oncology following and order placed for IR to assess and consider for kyphoplasty as it is felt this may help improve patient's pain management. -IR reviewed films and concerned about pathologic femur fractures and recommended MRI of the pelvis with evaluation by orthopedics. -MRI pelvis and MRI right femur  done with extensive osseous metastatic disease involving lower lumbar vertebral bodies, sacrum, ilium, bilateral acetabulum, bilateral femurs.  Abnormal bone lesion encompassing nearly entirety of right femoral head and femoral neck with severe cortical destruction of anterior femoral head neck junction placing patient high risk for pathologic fracture.  No acute fracture identified on MRI, cortical irregularity on recent CT pelvis performed concerning for nondisplaced pathologic fracture. -MRI with abnormal bone lesion in anterior left femoral neck with focal severe cortical destruction placing patient high risk for pathologic fracture.  Abnormal bone lesion encompassing nearly entirety of left side of sacrum and ilium with possibly nondisplaced pathologic fracture of the left sacral alla.  Large right hip joint effusion.  Mild edema in the left gluteus minimus muscle likely reflecting mild muscle strain. -Patient currently will be placed on bedrest until reassessed by orthopedics. -Patient was assessed by orthopedics on 01/16/2021 we will follow-up on MRI films for further recommendations.  -Patient has been placed on scheduled IV Dilaudid per oncology. -Palliative care consulted for symptom management. -Oncology/IR/orthopedics/palliative care following and appreciate input and recommendations.  2.  Metastatic breast cancer -2D echo with normal EF,NWMA -Per oncology.  3.  Anemia of chronic disease -Likely anemia of chronic disease. -Patient with no overt bleeding. -Anemia panel consistent with anemia of chronic disease.   -Hemoglobin stable at 11.4.   -Transfusion threshold hemoglobin < 7.  4.  Hypercalcemia -Likely hypercalcemia of malignancy. -Patient with metastatic breast cancer. -Continue IV fluids.   -Magnesium at 1.7, phosphorus at 3.5.   -Status post IV pamidronate x1 (01/16/2021 ). -Continue calcitonin nasal spray, -Oncology following.   DVT prophylaxis: SCDs Code Status:  Full Family Communication: Updated patient.  Updated mother at bedside.  Disposition:  Status is: Inpatient  The patient will require care spanning > 2 midnights and should be moved to inpatient because: Ongoing active pain requiring inpatient pain management  Dispo: The patient is from: Home              Anticipated d/c is to:  TBD              Patient currently is not medically stable to d/c.   Difficult to place patient No       Consultants:  Oncology: Dr. Jana Hakim. IR Orthopedics: Dr. Erlinda Hong 01/16/2021  Procedures:  CT abdomen and pelvis 01/14/2021 2D echo 01/14/2021 MRI right femur/MRI pelvis 01/16/2021 Plain films right femur 01/16/2021  Antimicrobials:  None   Subjective: Patient states when laying still in bed pain is controlled however when stands up to ambulate or go to the restroom has significant pain with no significant change from admission.  Patient stated just saw palliative care who recommended bedrest at this time due to findings noted on MRI.  No chest pain.  No shortness of breath.  Mother at bedside.    Objective: Vitals:   01/15/21 2106 01/16/21 0528 01/16/21 2125 01/17/21 0625  BP: (!) 142/81 132/79 117/76 139/88  Pulse: 89 91 82 87  Resp: 16 16 14 14   Temp: 98.2 F (36.8 C) 98.5 F (36.9 C) (!) 97.4 F (36.3 C) 98.6 F (37 C)  TempSrc: Oral Oral Oral Oral  SpO2: 98% 95% 93% 93%  Weight:      Height:       No intake or output data in the 24 hours ending 01/17/21 1317  Filed Weights   01/14/21 1740  Weight: 78 kg    Examination:  General exam: : NAD Respiratory system: CTA B anterior lung fields.  No wheezes, no rhonchi.  Speaking in full sentences.  Normal respiratory effort. Cardiovascular system: Regular rate and rhythm no murmurs rubs or gallops.  No JVD.  No lower extremity edema.  Gastrointestinal system: Abdomen soft, nontender, nondistended, positive bowel sounds.  No rebound.  No guarding. Central nervous system: Alert and oriented. No  focal neurological deficits. Extremities: Symmetric 5 x 5 power. Skin: No rashes, lesions or ulcers Psychiatry: Judgement and insight appear normal. Mood & affect appropriate.  Data Reviewed: I have personally reviewed following labs and imaging studies  CBC: Recent Labs  Lab 01/14/21 1255 01/16/21 0516 01/17/21 1014  WBC 10.6* 7.7 7.0  NEUTROABS 8.3* 5.5 5.6  HGB 11.9* 12.2 11.4*  HCT 37.3 38.9 35.7*  MCV 94.4 95.1 95.5  PLT 270 278 234     Basic Metabolic Panel: Recent Labs  Lab 01/14/21 1255 01/16/21 0516 01/17/21 1014  NA 137 139 135  K 3.6 4.0 3.4*  CL 97* 98 101  CO2 29 30 24   GLUCOSE 101* 107* 166*  BUN 18 15 13   CREATININE 0.84 0.85 0.76  CALCIUM 10.5* 11.1* 10.9*  MG  --  1.8 1.7  PHOS  --  4.5 3.5     GFR: Estimated Creatinine Clearance: 88.7 mL/min (by C-G formula based on SCr of 0.76 mg/dL).  Liver Function Tests: Recent Labs  Lab 01/14/21 1255 01/17/21 1014  AST 82*  --   ALT 73*  --   ALKPHOS 166*  --   BILITOT 0.8  --   PROT 7.3  --   ALBUMIN 3.8 3.2*     CBG: No results for input(s): GLUCAP in the last 168 hours.   Recent Results (from the past 240  hour(s))  SARS CORONAVIRUS 2 (TAT 6-24 HRS) Nasopharyngeal Nasopharyngeal Swab     Status: None   Collection Time: 01/14/21  4:43 PM   Specimen: Nasopharyngeal Swab  Result Value Ref Range Status   SARS Coronavirus 2 NEGATIVE NEGATIVE Final    Comment: (NOTE) SARS-CoV-2 target nucleic acids are NOT DETECTED.  The SARS-CoV-2 RNA is generally detectable in upper and lower respiratory specimens during the acute phase of infection. Negative results do not preclude SARS-CoV-2 infection, do not rule out co-infections with other pathogens, and should not be used as the sole basis for treatment or other patient management decisions. Negative results must be combined with clinical observations, patient history, and epidemiological information. The expected result is Negative.  Fact  Sheet for Patients: SugarRoll.be  Fact Sheet for Healthcare Providers: https://www.woods-mathews.com/  This test is not yet approved or cleared by the [place] FDA and  has been authorized for detection and/or diagnosis of SARS-CoV-2 by FDA under an Emergency Use Authorization (EUA). This EUA will remain  in effect (meaning this test can be used) for the duration of the COVID-19 declaration under Se ction 564(b)(1) of the Act, 21 U.S.C. section 360bbb-3(b)(1), unless the authorization is terminated or revoked sooner.  Performed at [facility], [place] [address] [place], [place] [address]           Radiology Studies: MR PELVIS W WO CONTRAST  Result Date: 01/17/2021 CLINICAL DATA:  Metastatic disease, evaluate for femur fracture EXAM: MRI PELVIS WITHOUT AND WITH CONTRAST MRI FEMUR WITHOUT AND WITH CONTRAST TECHNIQUE: Multiplanar multisequence MR imaging of the pelvis was performed both before and after administration of intravenous contrast. Multiplanar multisequence MR imaging of the femur was performed both before and after administration of intravenous contrast. CONTRAST:  91mL GADAVIST GADOBUTROL 1 MMOL/ML IV SOLN COMPARISON:  CT pelvis 01/14/2021 FINDINGS: Bones: Numerous abnormal enhancing bone lesions in the lower lumbar vertebral bodies, sacrum, ilium, bilateral acetabulum, and bilateral proximal femurs. Abnormal bone lesion encompasses nearly the entirety of the left side of the sacrum and ilium with possible nondisplaced pathologic fracture of the left sacral ala. Abnormal bone lesion in the anterior left femoral neck with focal severe cortical destruction placing the patient at high risk for a pathologic fracture. Abnormal bone lesion encompasses nearly the entirety of the right femoral head and femoral neck with severe cortical destruction of the anterior femoral head-neck junction placing the patient at high risk for a pathologic  fracture. Although no acute fracture is identified on this MRI, there is cortical irregularity along the recent CT of the pelvis performed on 01/14/2021 concerning for a nondisplaced pathologic fracture. Abnormal bone lesions throughout the inferior pubic rami bilaterally. Pathologic nondisplaced fracture of the left inferior pubic ramus. Abnormal bone lesions in the right distal femoral diaphysis and metaphysis with cortical destruction of the posterolateral margin. At small abnormal bone lesion in the mid left femoral diaphysis. Abnormal bone lesions in the left distal femoral metaphysis. No other acute fracture or dislocation. Normal sacrum and sacroiliac joints. No SI joint widening or erosive changes. Articular cartilage and labrum Articular cartilage:  No chondral defect. Labrum: Grossly intact, but evaluation is limited by lack of intraarticular fluid. Joint or bursal effusion Joint effusion: Large right hip joint effusion. No left hip joint effusion. No right SI joint effusion. Small left SI joint effusion. Bursae: No bursa formation. Muscles and tendons Flexors: Normal. Extensors: Normal. Abductors: Normal. Adductors: Normal. Gluteals: Mild edema in the left gluteus minimus muscle likely reflecting  mild muscle strain. Hamstrings: Normal. Other findings No pelvic free fluid. No fluid collection or hematoma. No inguinal lymphadenopathy. No inguinal hernia. IMPRESSION: 1. Extensive osseous metastatic disease likely secondary to breast cancer involving the lower lumbar vertebral bodies, sacrum, ilium, bilateral acetabulum, and bilateral femurs as described above. 2. Abnormal bone lesion encompasses nearly the entirety of the right femoral head and femoral neck with severe cortical destruction of the anterior femoral head-neck junction placing the patient at high risk for a pathologic fracture. Although no acute fracture is identified on this MRI, there is cortical irregularity on the recent CT of the pelvis  performed on 01/14/2021 concerning for a nondisplaced pathologic fracture. 3. Pathologic nondisplaced fracture of the left inferior pubic ramus. 4. Abnormal bone lesion in the anterior left femoral neck with focal severe cortical destruction placing the patient at high risk for a pathologic fracture. 5. Abnormal bone lesion encompasses nearly the entirety of the left side of the sacrum and ilium with possible nondisplaced pathologic fracture of the left sacral ala. 6. Large right hip joint effusion. 7. Mild edema in the left gluteus minimus muscle likely reflecting mild muscle strain. Electronically Signed   By: Kathreen Devoid   On: 01/17/2021 07:34   MR FEMUR RIGHT W WO CONTRAST  Result Date: 01/17/2021 CLINICAL DATA:  Metastatic disease, evaluate for femur fracture EXAM: MRI PELVIS WITHOUT AND WITH CONTRAST MRI FEMUR WITHOUT AND WITH CONTRAST TECHNIQUE: Multiplanar multisequence MR imaging of the pelvis was performed both before and after administration of intravenous contrast. Multiplanar multisequence MR imaging of the femur was performed both before and after administration of intravenous contrast. CONTRAST:  31mL GADAVIST GADOBUTROL 1 MMOL/ML IV SOLN COMPARISON:  CT pelvis 01/14/2021 FINDINGS: Bones: Numerous abnormal enhancing bone lesions in the lower lumbar vertebral bodies, sacrum, ilium, bilateral acetabulum, and bilateral proximal femurs. Abnormal bone lesion encompasses nearly the entirety of the left side of the sacrum and ilium with possible nondisplaced pathologic fracture of the left sacral ala. Abnormal bone lesion in the anterior left femoral neck with focal severe cortical destruction placing the patient at high risk for a pathologic fracture. Abnormal bone lesion encompasses nearly the entirety of the right femoral head and femoral neck with severe cortical destruction of the anterior femoral head-neck junction placing the patient at high risk for a pathologic fracture. Although no acute  fracture is identified on this MRI, there is cortical irregularity along the recent CT of the pelvis performed on 01/14/2021 concerning for a nondisplaced pathologic fracture. Abnormal bone lesions throughout the inferior pubic rami bilaterally. Pathologic nondisplaced fracture of the left inferior pubic ramus. Abnormal bone lesions in the right distal femoral diaphysis and metaphysis with cortical destruction of the posterolateral margin. At small abnormal bone lesion in the mid left femoral diaphysis. Abnormal bone lesions in the left distal femoral metaphysis. No other acute fracture or dislocation. Normal sacrum and sacroiliac joints. No SI joint widening or erosive changes. Articular cartilage and labrum Articular cartilage:  No chondral defect. Labrum: Grossly intact, but evaluation is limited by lack of intraarticular fluid. Joint or bursal effusion Joint effusion: Large right hip joint effusion. No left hip joint effusion. No right SI joint effusion. Small left SI joint effusion. Bursae: No bursa formation. Muscles and tendons Flexors: Normal. Extensors: Normal. Abductors: Normal. Adductors: Normal. Gluteals: Mild edema in the left gluteus minimus muscle likely reflecting mild muscle strain. Hamstrings: Normal. Other findings No pelvic free fluid. No fluid collection or hematoma. No inguinal lymphadenopathy. No inguinal hernia. IMPRESSION:  1. Extensive osseous metastatic disease likely secondary to breast cancer involving the lower lumbar vertebral bodies, sacrum, ilium, bilateral acetabulum, and bilateral femurs as described above. 2. Abnormal bone lesion encompasses nearly the entirety of the right femoral head and femoral neck with severe cortical destruction of the anterior femoral head-neck junction placing the patient at high risk for a pathologic fracture. Although no acute fracture is identified on this MRI, there is cortical irregularity on the recent CT of the pelvis performed on 01/14/2021  concerning for a nondisplaced pathologic fracture. 3. Pathologic nondisplaced fracture of the left inferior pubic ramus. 4. Abnormal bone lesion in the anterior left femoral neck with focal severe cortical destruction placing the patient at high risk for a pathologic fracture. 5. Abnormal bone lesion encompasses nearly the entirety of the left side of the sacrum and ilium with possible nondisplaced pathologic fracture of the left sacral ala. 6. Large right hip joint effusion. 7. Mild edema in the left gluteus minimus muscle likely reflecting mild muscle strain. Electronically Signed   By: Kathreen Devoid   On: 01/17/2021 07:34   DG FEMUR PORT, MIN 2 VIEWS RIGHT  Result Date: 01/16/2021 CLINICAL DATA:  Right femur pain history of cancer with Mets EXAM: RIGHT FEMUR PORTABLE 2 VIEW COMPARISON:  CT 01/14/2021 FINDINGS: Focal permeative appearance of the distal femoral shaft, raises concern for metastatic disease. Lucent lesion seen on recent pelvic CT within the proximal femur is better seen on CT. No malalignment. Suspected right femoral neck fracture is also better seen on CT. IMPRESSION: 1. CT demonstrated metastatic lucent lesions on recent pelvic CT at the proximal right femur better demonstrated on CT. Possible subtle step-off at the femoral neck, may correspond to suspected femoral neck fracture on CT. 2. Focal permeative appearance of the distal shaft of the right femur concerning for metastatic focus. Electronically Signed   By: Donavan Foil M.D.   On: 01/16/2021 18:23        Scheduled Meds:  bisacodyl  10 mg Rectal Once   calcitonin (salmon)  1 spray Alternating Nares Daily   capecitabine  1,500 mg Oral BID AC   gabapentin  300 mg Oral TID    HYDROmorphone (DILAUDID) injection  1 mg Intravenous Q3H   ibuprofen  600 mg Oral TID   ondansetron (ZOFRAN) IV  4 mg Intravenous TID AC & HS   pantoprazole  40 mg Oral Daily   polyethylene glycol  17 g Oral BID   senna-docusate  2 tablet Oral BID    Continuous Infusions:  sodium chloride 125 mL/hr at 01/16/21 2046     LOS: 3 days    Time spent: 35 minutes    Irine Seal, MD [facility]   To contact the attending provider between 7A-7P or the covering provider during after hours 7P-7A, please log into the web site [email or web address] and access using universal [place] password for that web site. If you do not have the password, please call the hospital operator.  01/17/2021, 1:17 PM

## 2021-01-17 NOTE — Consult Note (Signed)
Palliative Care Consultation Note Reason for Consult: Pain and Symptom Management Requested by: [facility]  Monica [name] is a 49 yo woman diagnosed with breast cancer in 2019 stage 1A at time of dx and underwent lumpoectomy, chemo and radiation and was started on Tamoxifen in 2020. She presented May 2022 with progressive back pain and was found to have widely metastatic disease involving liver, lung, bone and brain. She has been admitted for intractable cancer related pain, she cannot ambulate due to pain. CT on admission showed L4 compression fracture. Further consultation with orthopedics was obtained after review of imaging by IR and concern for femoral fractures noted.  Palliative care consult requested for pain and symptom management.  I met with Monica [name] today to discuss how palliative care could support her during this admission and to assist with complex pain management.  At the time of my visit I reviewed the recently resulted MRI findings showing extensive tumor infiltration of her femur, pelvic fractures and very extensive metastatic disease in her spine. These results had not yet been discussed with her so I shared with her the findings and contacted Dr. Erlinda Hong. Dr. Erlinda Hong will see her this afternoon  to discuss referral to tertiary center for ortho-onc surgical intervention. I discussed with Monica [name] that urgent stabilization and preservation of her mobility and maintaining her functional independence would be critical to treating her cancer and her overall well being.  Understandably, Monica [name] feels complete shock and is overwhelmed by the sudden and aggressive cancer recurrence. She is encouraged that our current pain control efforts are helping. She is receiving hydromorphone q3, we started Calcitonin yesterday and she received pamidronate infusion.  She is also on scheduled Ibuprofen.  Most of her pain is weight bearing and movement induced. She can be very comfortable if seated in bed with HOB 90-45  degrees. I discussed the very painful nature of her pelvic path/insufficiency fractures along with femoral head tumor infiltration and lumbar compression fx. Bedrest until ortho can see her -very careful movements offloading femurs and pelvis if she needs to reposition or get OOB for toileting.  Assessment: Metastatic Breast Cancer Cancer related Pain Multiple Pathologic Fractures- Lumbar, Pelvis and Femur tumor infiltration- high risk for instability  Hypercalcemia on admission  Recommendations:  Goals of Care: Full scope medical intervention, aggressive treatment of cancer recurrence, orthopedic stabilization Symptom Management: Continue scheduled IV hydromorphone-once we have her on stable dose can attempt transition to [facility] or scheduled oral hydromorphone. Continue Anti-inflammatory -Increased Ibuprophen to 853m, will hold steroid administration if surgical intervention is being considered. Gabapentin 300 TID Needs Aggressive bowel regimen with higher dose opioids and GI protection. Trazodone 520mPRN for sleep Received Pamidronate-but XgDelton Sees superior for preventing SRE-needs this as soon as she is able to receive in outpatient setting. Her corrected calcium was 11.3 on admission. Intranasal Calcitonin started yesterday-IV fluids.   Discussed above plan in detail with Monica [name] and Dr. ThGrandville SilosDr. XuErlinda Hongo see soon.  ElLane HackerDO Palliative Medicine   Time: 70 minutes Greater than 50%  of this time was spent counseling and coordinating care related to the above assessment and plan.

## 2021-01-17 NOTE — Progress Notes (Signed)
Patient ID: Monica [name], female   DOB: November 03, 1970, 50 y.o.   MRN: [id number] Will plan to review latest MRI pelvis/femur with Dr. Laurence Ferrari on 7/12 when he returns to work and make additional recs at that time; pt's insurance status still pending as well

## 2021-01-17 NOTE — Progress Notes (Signed)
I followed up with the patient today to explain the recent MRI findings. The tumor involves essentially the entire proximal femur which makes fixation with a cephalomedullary device or a conventional total hip replacement very tenuous and at high risk for failure. I think the most appropriate surgery would be a proximal femur replacement which requires the expertise of an orthopedic oncologist at a tertiary care Center such as [facility]. I have contacted my colleague Dr. Magda Bernheim at [facility] for his expertise in this matter and he has graciously agreed to see her in consultation once the patient has been transferred to his center. I have asked Dr. Grandville Silos of [facility] to initiate a hospitalist to hospitalist transfer. I have also discussed this case with Dr. Jana Hakim and I feel that surgery is more urgent than radiation due to the high risk of pathol ogic fracture. She has been placed on bedrest due to this risk. All of this was discussed with the patient and her mother who are both in agreement with the treatment plan.

## 2021-01-18 LAB — CBC WITH DIFFERENTIAL/PLATELET
Abs Immature Granulocytes: 0.09 10*3/uL — ABNORMAL HIGH (ref 0.00–0.07)
Basophils Absolute: 0 10*3/uL (ref 0.0–0.1)
Basophils Relative: 1 %
Eosinophils Absolute: 0.1 10*3/uL (ref 0.0–0.5)
Eosinophils Relative: 3 %
HCT: 30.2 % — ABNORMAL LOW (ref 36.0–46.0)
Hemoglobin: 9.7 g/dL — ABNORMAL LOW (ref 12.0–15.0)
Immature Granulocytes: 2 %
Lymphocytes Relative: 5 %
Lymphs Abs: 0.2 10*3/uL — ABNORMAL LOW (ref 0.7–4.0)
MCH: 30.2 pg (ref 26.0–34.0)
MCHC: 32.1 g/dL (ref 30.0–36.0)
MCV: 94.1 fL (ref 80.0–100.0)
Monocytes Absolute: 0.3 10*3/uL (ref 0.1–1.0)
Monocytes Relative: 7 %
Neutro Abs: 3.5 10*3/uL (ref 1.7–7.7)
Neutrophils Relative %: 82 %
Platelets: 156 10*3/uL (ref 150–400)
RBC: 3.21 MIL/uL — ABNORMAL LOW (ref 3.87–5.11)
RDW: 14.4 % (ref 11.5–15.5)
WBC: 4.3 10*3/uL (ref 4.0–10.5)
nRBC: 0 % (ref 0.0–0.2)

## 2021-01-18 LAB — BASIC METABOLIC PANEL
Anion gap: 7 (ref 5–15)
BUN: 10 mg/dL (ref 6–20)
CO2: 25 mmol/L (ref 22–32)
Calcium: 8.8 mg/dL — ABNORMAL LOW (ref 8.9–10.3)
Chloride: 105 mmol/L (ref 98–111)
Creatinine, Ser: 0.74 mg/dL (ref 0.44–1.00)
GFR, Estimated: >60 mL/min (ref >60)
Glucose, Bld: 95 mg/dL (ref 70–99)
Potassium: 3.4 mmol/L — ABNORMAL LOW (ref 3.5–5.1)
Sodium: 137 mmol/L (ref 135–145)

## 2021-01-18 LAB — MAGNESIUM: Magnesium: 1.6 mg/dL — ABNORMAL LOW (ref 1.7–2.4)

## 2021-01-18 MED ORDER — POTASSIUM CHLORIDE CRYS ER 20 MEQ PO TBCR
40.0000 meq | EXTENDED_RELEASE_TABLET | Freq: Once | ORAL | Status: AC
  Administered 2021-01-18: 40 meq via ORAL
  Filled 2021-01-18: qty 2

## 2021-01-18 MED ORDER — IBUPROFEN 800 MG PO TABS
800.0000 mg | ORAL_TABLET | Freq: Three times a day (TID) | ORAL | Status: DC
  Administered 2021-01-18 – 2021-01-21 (×11): 800 mg via ORAL
  Filled 2021-01-18 (×11): qty 1

## 2021-01-18 MED ORDER — MAGNESIUM SULFATE 4 GM/100ML IV SOLN
4.0000 g | Freq: Once | INTRAVENOUS | Status: AC
  Administered 2021-01-18: 4 g via INTRAVENOUS
  Filled 2021-01-18: qty 100

## 2021-01-18 MED ORDER — TRAZODONE HCL 50 MG PO TABS
50.0000 mg | ORAL_TABLET | Freq: Every evening | ORAL | Status: DC | PRN
  Administered 2021-01-20: 50 mg via ORAL
  Filled 2021-01-18: qty 1

## 2021-01-18 NOTE — Progress Notes (Addendum)
[facility] yesterday and this morning to initiate transfer for evaluation for impending pathologic fracture as recommended by orthopedics.  Orthopedics had spoken to one of his colleagues Dr. Magda Bernheim at [facility] for his expertise in this matter and he had agreed to see patient in consultation once patient was transferred. Was called back and told that [facility] at this time is declining transfer of patient as patient does not meet criteria for inpatient transfer as well as they are at capacity.  Called [facility] this morning and was told health system is currently at capacity and not accepting any transfers at this time.  No charge.

## 2021-01-18 NOTE — Progress Notes (Signed)
PROGRESS NOTE    Monica [name]  YPP:[id number] DOB: 09/26/1970 DOA: 01/14/2021 PCP: [name], PA-C    No chief complaint on file.   Brief Narrative:  Patient 50 year old female history of metastatic breast cancer being followed by Dr. Jana Hakim presented with increasing pain mostly in the hips and lower extremities with difficulty ambulating.  Pain very severe that patient had to use a walker.  Patient admitted for pain control.  Patient also due to start Herceptin soon and 2D echo ordered.   Assessment & Plan:   Principal Problem:   Cancer related pain Active Problems:   Malignant neoplasm of upper-inner quadrant of right breast in female, estrogen receptor positive (HCC)   Bone metastases (HCC)   Pain   Hypercalcemia   1 cancer related pain secondary to history of widely metastatic breast cancer with osseous mets/concern for impending pathologic fracture -Patient with significant lower extremity pain leading to difficulty ambulating.  Patient lives on the second level with no elevator at home and will be unsafe to discharge with difficulty ambulating and pain uncontrolled. -CT pelvis done with new interval superior endplate compression fracture deformity at L4 with 20% loss of height centrally, no retropulsion, this reflects pathologic fracture.  Additional multifocal lytic/sclerotic osseous metastases in the visualized axial and appendicular skeleton, unchanged. -Patient still requiring IV Dilaudid pain medication every 3-4 hours per MAR. -Continue as needed oxycodone. -Continue scheduled ibuprofen 600 mg 3 times daily.   -Continue scheduled Neurontin and calcitonin nasal spray. -Oncology following and order placed for IR to assess and consider for kyphoplasty as it is felt this may help improve patient's pain management. -IR reviewed films and concerned about pathologic femur fractures and recommended MRI of the pelvis with evaluation by orthopedics. -MRI pelvis and MRI  right femur done with extensive osseous metastatic disease involving lower lumbar vertebral bodies, sacrum, ilium, bilateral acetabulum, bilateral femurs.  Abnormal bone lesion encompassing nearly entirety of right femoral head and femoral neck with severe cortical destruction of anterior femoral head neck junction placing patient high risk for pathologic fracture.  No acute fracture identified on MRI, cortical irregularity on recent CT pelvis performed concerning for nondisplaced pathologic fracture. -MRI with abnormal bone lesion in anterior left femoral neck with focal severe cortical destruction placing patient high risk for pathologic fracture.  Abnormal bone lesion encompassing nearly entirety of left side of sacrum and ilium with possibly nondisplaced pathologic fracture of the left sacral alla.  Large right hip joint effusion.  Mild edema in the left gluteus minimus muscle likely reflecting mild muscle strain. -Patient currently on bedrest.  -Patient reassessed by Dr.Xu, orthopedics on 01/17/2021 who reviewed MRI findings and based on MRI findings feels patient is at high risk for impending pathologic fracture that would require proximal femur replacement which requires expertise of orthopedic oncologist at the tertiary care center such as [facility].   -Dr Erlinda Hong, orthopedics spoke with one of his colleagues Dr. Magda Bernheim at [facility] for his expertise in this matter and he graciously agreed to see patient in consultation once patient arrived at their center. -[facility] as well as [facility] yesterday and today and was told no beds available as both hospitals currently at full capacity.   -Continue current pain regimen of scheduled IV Dilaudid, scheduled ibuprofen, Neurontin, calcitonin, trazodone as needed sleep.  -Appreciate palliative care, oncology, IR, orthopedics input and recommendations.    2.  Metastatic breast cancer -2D echo with  normal EF,NWMA -  Per oncology.  3.  Anemia of chronic disease -Likely anemia of chronic disease. -Patient with no overt bleeding. -Anemia panel consistent with anemia of chronic disease.   -Hemoglobin currently at 9.7 from 11.4 likely dilutional effect.  -Follow H&H -Transfusion threshold hemoglobin < 7.  4.  Hypercalcemia -Likely hypercalcemia of malignancy. -Patient with metastatic breast cancer. -Continue IV fluids.   -Magnesium at 1.6, phosphorus at 3.5.   -Status post IV pamidronate x1 (01/16/2021 ). -Continue calcitonin nasal spray, -Corrected calcium at 9.44 -Oncology following.  5.  Hypokalemia/hypomagnesemia -Magnesium sulfate 4 g IV x1. -K-Dur 40 mEq p.o. x1. -Repeat labs in the morning.   DVT prophylaxis: SCDs Code Status: Full Family Communication: Updated patient.  Updated mother at bedside.  Disposition:   Status is: Inpatient  The patient will require care spanning > 2 midnights and should be moved to inpatient because: Ongoing active pain requiring inpatient pain management  Dispo: The patient is from: Home              Anticipated d/c is to: Awaiting transfer to tertiary care center.              Patient currently is not medically stable to d/c.   Difficult to place patient No       Consultants:  Oncology: Dr. Jana Hakim. IR Orthopedics: Dr. Erlinda Hong 01/16/2021  Procedures:  CT abdomen and pelvis 01/14/2021 2D echo 01/14/2021 MRI right femur/MRI pelvis 01/16/2021 Plain films right femur 01/16/2021  Antimicrobials:  None   Subjective: Sitting up in bed mother at bedside.  States as long as she remains still in the bed and on scheduled IV Dilaudid pain is controlled however when she gets up to ambulate or move around still with pain.  No chest pain.  No shortness of breath.  No abdominal pain.    Objective: Vitals:   01/17/21 2105 01/18/21 0547 01/18/21 0828 01/18/21 1018  BP: 116/78 134/83 (!) 134/91 (!) 132/95  Pulse: (!) 107 93 86 91  Resp: 18 16 14 16    Temp: 99.3 F (37.4 C) 97.8 F (36.6 C) 98.6 F (37 C) 98.2 F (36.8 C)  TempSrc: Oral Oral Oral Oral  SpO2: 95% 97% 96% 98%  Weight:      Height:        Intake/Output Summary (Last 24 hours) at 01/18/2021 1404 Last data filed at 01/18/2021 1250 Gross per 24 hour  Intake 4253.54 ml  Output --  Net 4253.54 ml    Filed Weights   01/14/21 1740  Weight: 78 kg    Examination:  General exam: : NAD Respiratory system: CTA B.  No wheezes, no crackles, no rhonchi.  Normal respiratory effort.   Cardiovascular system: RRR no murmurs rubs or gallops.  No JVD.  No lower extremity edema.   Gastrointestinal system: Abdomen is soft, nontender, nondistended, positive bowel sounds.  No rebound.  No guarding.   Central nervous system: Alert and oriented.  No focal neurological deficits.   Extremities: Symmetric 5 x 5 power. Skin: No rashes, lesions or ulcers Psychiatry: Judgement and insight appear normal. Mood & affect appropriate.  Data Reviewed: I have personally reviewed following labs and imaging studies  CBC: Recent Labs  Lab 01/14/21 1255 01/16/21 0516 01/17/21 1014 01/18/21 0554  WBC 10.6* 7.7 7.0 4.3  NEUTROABS 8.3* 5.5 5.6 3.5  HGB 11.9* 12.2 11.4* 9.7*  HCT 37.3 38.9 35.7* 30.2*  MCV 94.4 95.1 95.5 94.1  PLT 270 278 234 156     Basic Metabolic  Panel: Recent Labs  Lab 01/14/21 1255 01/16/21 0516 01/17/21 1014 01/18/21 0554  NA 137 139 135 137  K 3.6 4.0 3.4* 3.4*  CL 97* 98 101 105  CO2 29 30 24 25   GLUCOSE 101* 107* 166* 95  BUN 18 15 13 10   CREATININE 0.84 0.85 0.76 0.74  CALCIUM 10.5* 11.1* 10.9* 8.8*  MG  --  1.8 1.7 1.6*  PHOS  --  4.5 3.5  --      GFR: Estimated Creatinine Clearance: 88.7 mL/min (by C-G formula based on SCr of 0.74 mg/dL).  Liver Function Tests: Recent Labs  Lab 01/14/21 1255 01/17/21 1014  AST 82*  --   ALT 73*  --   ALKPHOS 166*  --   BILITOT 0.8  --   PROT 7.3  --   ALBUMIN 3.8 3.2*     CBG: No results for  input(s): GLUCAP in the last 168 hours.   Recent Results (from the past 240 hour(s))  SARS CORONAVIRUS 2 (TAT 6-24 HRS) Nasopharyngeal Nasopharyngeal Swab     Status: None   Collection Time: 01/14/21  4:43 PM   Specimen: Nasopharyngeal Swab  Result Value Ref Range Status   SARS Coronavirus 2 NEGATIVE NEGATIVE Final    Comment: (NOTE) SARS-CoV-2 target nucleic acids are NOT DETECTED.  The SARS-CoV-2 RNA is generally detectable in upper and lower respiratory specimens during the acute phase of infection. Negative results do not preclude SARS-CoV-2 infection, do not rule out co-infections with other pathogens, and should not be used as the sole basis for treatment or other patient management decisions. Negative results must be combined with clinical observations, patient history, and epidemiological information. The expected result is Negative.  Fact Sheet for Patients: SugarRoll.be  Fact Sheet for Healthcare Providers: https://www.woods-mathews.com/  This test is not yet approved or cleared by the [place] FDA and  has been authorized for detection and/or diagnosis of SARS-CoV-2 by FDA under an Emergency Use Authorization (EUA). This EUA will remain  in effect (meaning this test can be used) for the duration of the COVID-19 declaration under Se ction 564(b)(1) of the Act, 21 U.S.C. section 360bbb-3(b)(1), unless the authorization is terminated or revoked sooner.  Performed at [facility], [place] [address], [place], [place] [address]           Radiology Studies: MR PELVIS W WO CONTRAST  Result Date: 01/17/2021 CLINICAL DATA:  Metastatic disease, evaluate for femur fracture EXAM: MRI PELVIS WITHOUT AND WITH CONTRAST MRI FEMUR WITHOUT AND WITH CONTRAST TECHNIQUE: Multiplanar multisequence MR imaging of the pelvis was performed both before and after administration of intravenous contrast. Multiplanar multisequence MR imaging  of the femur was performed both before and after administration of intravenous contrast. CONTRAST:  11mL GADAVIST GADOBUTROL 1 MMOL/ML IV SOLN COMPARISON:  CT pelvis 01/14/2021 FINDINGS: Bones: Numerous abnormal enhancing bone lesions in the lower lumbar vertebral bodies, sacrum, ilium, bilateral acetabulum, and bilateral proximal femurs. Abnormal bone lesion encompasses nearly the entirety of the left side of the sacrum and ilium with possible nondisplaced pathologic fracture of the left sacral ala. Abnormal bone lesion in the anterior left femoral neck with focal severe cortical destruction placing the patient at high risk for a pathologic fracture. Abnormal bone lesion encompasses nearly the entirety of the right femoral head and femoral neck with severe cortical destruction of the anterior femoral head-neck junction placing the patient at high risk for a pathologic fracture. Although no acute fracture is identified on this MRI, there  is cortical irregularity along the recent CT of the pelvis performed on 01/14/2021 concerning for a nondisplaced pathologic fracture. Abnormal bone lesions throughout the inferior pubic rami bilaterally. Pathologic nondisplaced fracture of the left inferior pubic ramus. Abnormal bone lesions in the right distal femoral diaphysis and metaphysis with cortical destruction of the posterolateral margin. At small abnormal bone lesion in the mid left femoral diaphysis. Abnormal bone lesions in the left distal femoral metaphysis. No other acute fracture or dislocation. Normal sacrum and sacroiliac joints. No SI joint widening or erosive changes. Articular cartilage and labrum Articular cartilage:  No chondral defect. Labrum: Grossly intact, but evaluation is limited by lack of intraarticular fluid. Joint or bursal effusion Joint effusion: Large right hip joint effusion. No left hip joint effusion. No right SI joint effusion. Small left SI joint effusion. Bursae: No bursa formation. Muscles  and tendons Flexors: Normal. Extensors: Normal. Abductors: Normal. Adductors: Normal. Gluteals: Mild edema in the left gluteus minimus muscle likely reflecting mild muscle strain. Hamstrings: Normal. Other findings No pelvic free fluid. No fluid collection or hematoma. No inguinal lymphadenopathy. No inguinal hernia. IMPRESSION: 1. Extensive osseous metastatic disease likely secondary to breast cancer involving the lower lumbar vertebral bodies, sacrum, ilium, bilateral acetabulum, and bilateral femurs as described above. 2. Abnormal bone lesion encompasses nearly the entirety of the right femoral head and femoral neck with severe cortical destruction of the anterior femoral head-neck junction placing the patient at high risk for a pathologic fracture. Although no acute fracture is identified on this MRI, there is cortical irregularity on the recent CT of the pelvis performed on 01/14/2021 concerning for a nondisplaced pathologic fracture. 3. Pathologic nondisplaced fracture of the left inferior pubic ramus. 4. Abnormal bone lesion in the anterior left femoral neck with focal severe cortical destruction placing the patient at high risk for a pathologic fracture. 5. Abnormal bone lesion encompasses nearly the entirety of the left side of the sacrum and ilium with possible nondisplaced pathologic fracture of the left sacral ala. 6. Large right hip joint effusion. 7. Mild edema in the left gluteus minimus muscle likely reflecting mild muscle strain. Electronically Signed   By: Kathreen Devoid   On: 01/17/2021 07:34   MR FEMUR RIGHT W WO CONTRAST  Result Date: 01/17/2021 CLINICAL DATA:  Metastatic disease, evaluate for femur fracture EXAM: MRI PELVIS WITHOUT AND WITH CONTRAST MRI FEMUR WITHOUT AND WITH CONTRAST TECHNIQUE: Multiplanar multisequence MR imaging of the pelvis was performed both before and after administration of intravenous contrast. Multiplanar multisequence MR imaging of the femur was performed both  before and after administration of intravenous contrast. CONTRAST:  61mL GADAVIST GADOBUTROL 1 MMOL/ML IV SOLN COMPARISON:  CT pelvis 01/14/2021 FINDINGS: Bones: Numerous abnormal enhancing bone lesions in the lower lumbar vertebral bodies, sacrum, ilium, bilateral acetabulum, and bilateral proximal femurs. Abnormal bone lesion encompasses nearly the entirety of the left side of the sacrum and ilium with possible nondisplaced pathologic fracture of the left sacral ala. Abnormal bone lesion in the anterior left femoral neck with focal severe cortical destruction placing the patient at high risk for a pathologic fracture. Abnormal bone lesion encompasses nearly the entirety of the right femoral head and femoral neck with severe cortical destruction of the anterior femoral head-neck junction placing the patient at high risk for a pathologic fracture. Although no acute fracture is identified on this MRI, there is cortical irregularity along the recent CT of the pelvis performed on 01/14/2021 concerning for a nondisplaced pathologic fracture. Abnormal bone lesions throughout  the inferior pubic rami bilaterally. Pathologic nondisplaced fracture of the left inferior pubic ramus. Abnormal bone lesions in the right distal femoral diaphysis and metaphysis with cortical destruction of the posterolateral margin. At small abnormal bone lesion in the mid left femoral diaphysis. Abnormal bone lesions in the left distal femoral metaphysis. No other acute fracture or dislocation. Normal sacrum and sacroiliac joints. No SI joint widening or erosive changes. Articular cartilage and labrum Articular cartilage:  No chondral defect. Labrum: Grossly intact, but evaluation is limited by lack of intraarticular fluid. Joint or bursal effusion Joint effusion: Large right hip joint effusion. No left hip joint effusion. No right SI joint effusion. Small left SI joint effusion. Bursae: No bursa formation. Muscles and tendons Flexors: Normal.  Extensors: Normal. Abductors: Normal. Adductors: Normal. Gluteals: Mild edema in the left gluteus minimus muscle likely reflecting mild muscle strain. Hamstrings: Normal. Other findings No pelvic free fluid. No fluid collection or hematoma. No inguinal lymphadenopathy. No inguinal hernia. IMPRESSION: 1. Extensive osseous metastatic disease likely secondary to breast cancer involving the lower lumbar vertebral bodies, sacrum, ilium, bilateral acetabulum, and bilateral femurs as described above. 2. Abnormal bone lesion encompasses nearly the entirety of the right femoral head and femoral neck with severe cortical destruction of the anterior femoral head-neck junction placing the patient at high risk for a pathologic fracture. Although no acute fracture is identified on this MRI, there is cortical irregularity on the recent CT of the pelvis performed on 01/14/2021 concerning for a nondisplaced pathologic fracture. 3. Pathologic nondisplaced fracture of the left inferior pubic ramus. 4. Abnormal bone lesion in the anterior left femoral neck with focal severe cortical destruction placing the patient at high risk for a pathologic fracture. 5. Abnormal bone lesion encompasses nearly the entirety of the left side of the sacrum and ilium with possible nondisplaced pathologic fracture of the left sacral ala. 6. Large right hip joint effusion. 7. Mild edema in the left gluteus minimus muscle likely reflecting mild muscle strain. Electronically Signed   By: Kathreen Devoid   On: 01/17/2021 07:34   DG FEMUR PORT, MIN 2 VIEWS RIGHT  Result Date: 01/16/2021 CLINICAL DATA:  Right femur pain history of cancer with Mets EXAM: RIGHT FEMUR PORTABLE 2 VIEW COMPARISON:  CT 01/14/2021 FINDINGS: Focal permeative appearance of the distal femoral shaft, raises concern for metastatic disease. Lucent lesion seen on recent pelvic CT within the proximal femur is better seen on CT. No malalignment. Suspected right femoral neck fracture is also  better seen on CT. IMPRESSION: 1. CT demonstrated metastatic lucent lesions on recent pelvic CT at the proximal right femur better demonstrated on CT. Possible subtle step-off at the femoral neck, may correspond to suspected femoral neck fracture on CT. 2. Focal permeative appearance of the distal shaft of the right femur concerning for metastatic focus. Electronically Signed   By: Donavan Foil M.D.   On: 01/16/2021 18:23        Scheduled Meds:  bisacodyl  10 mg Rectal Once   calcitonin (salmon)  1 spray Alternating Nares Daily   capecitabine  1,500 mg Oral BID AC   enoxaparin (LOVENOX) injection  40 mg Subcutaneous Q24H   gabapentin  300 mg Oral TID    HYDROmorphone (DILAUDID) injection  1 mg Intravenous Q3H   ibuprofen  800 mg Oral TID   ondansetron (ZOFRAN) IV  4 mg Intravenous TID AC & HS   pantoprazole  40 mg Oral Daily   polyethylene glycol  17 g Oral BID  senna-docusate  2 tablet Oral BID   Continuous Infusions:  sodium chloride Stopped (01/18/21 1247)     LOS: 4 days    Time spent: 40 minutes    Irine Seal, MD [facility]   To contact the attending provider between 7A-7P or the covering provider during after hours 7P-7A, please log into the web site [email or web address] and access using universal [place] password for that web site. If you do not have the password, please call the hospital operator.  01/18/2021, 2:04 PM

## 2021-01-19 ENCOUNTER — Inpatient Hospital Stay (HOSPITAL_COMMUNITY): Payer: 59

## 2021-01-19 ENCOUNTER — Other Ambulatory Visit: Payer: Self-pay | Admitting: Oncology

## 2021-01-19 LAB — RENAL FUNCTION PANEL
Albumin: 2.9 g/dL — ABNORMAL LOW (ref 3.5–5.0)
Anion gap: 6 (ref 5–15)
BUN: 8 mg/dL (ref 6–20)
CO2: 26 mmol/L (ref 22–32)
Calcium: 8.2 mg/dL — ABNORMAL LOW (ref 8.9–10.3)
Chloride: 108 mmol/L (ref 98–111)
Creatinine, Ser: 0.66 mg/dL (ref 0.44–1.00)
GFR, Estimated: >60 mL/min (ref >60)
Glucose, Bld: 96 mg/dL (ref 70–99)
Phosphorus: 2.3 mg/dL — ABNORMAL LOW (ref 2.5–4.6)
Potassium: 3.6 mmol/L (ref 3.5–5.1)
Sodium: 140 mmol/L (ref 135–145)

## 2021-01-19 LAB — CBC
HCT: 33.4 % — ABNORMAL LOW (ref 36.0–46.0)
Hemoglobin: 10.4 g/dL — ABNORMAL LOW (ref 12.0–15.0)
MCH: 29.6 pg (ref 26.0–34.0)
MCHC: 31.1 g/dL (ref 30.0–36.0)
MCV: 95.2 fL (ref 80.0–100.0)
Platelets: 166 10*3/uL (ref 150–400)
RBC: 3.51 MIL/uL — ABNORMAL LOW (ref 3.87–5.11)
RDW: 14.9 % (ref 11.5–15.5)
WBC: 3.1 10*3/uL — ABNORMAL LOW (ref 4.0–10.5)
nRBC: 0 % (ref 0.0–0.2)

## 2021-01-19 LAB — MAGNESIUM: Magnesium: 2.1 mg/dL (ref 1.7–2.4)

## 2021-01-19 IMAGING — DX DG SHOULDER 2+V*L*
3 series · 3 of 3 positions shown · non-contrast
Comparison: Chest CT [DATE] and radiograph [DATE].

CLINICAL DATA: Shoulder pain

EXAM:
LEFT SHOULDER - 2+ VIEW

[shoulder ap]
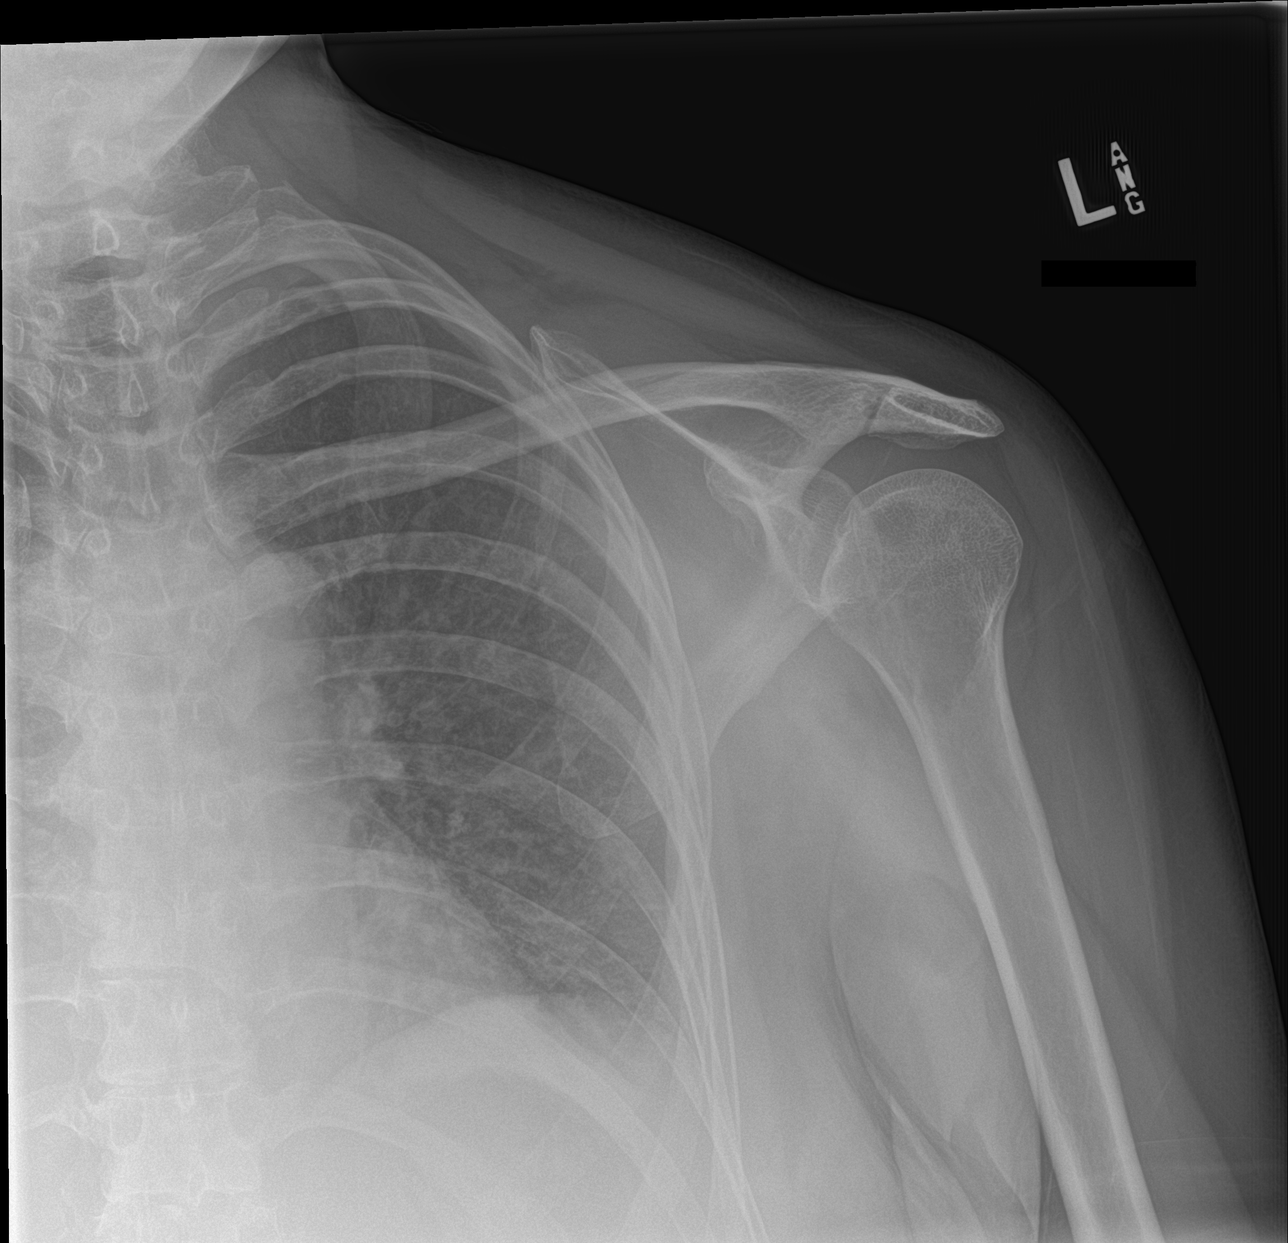

[shoulder obl (1 of 2)]
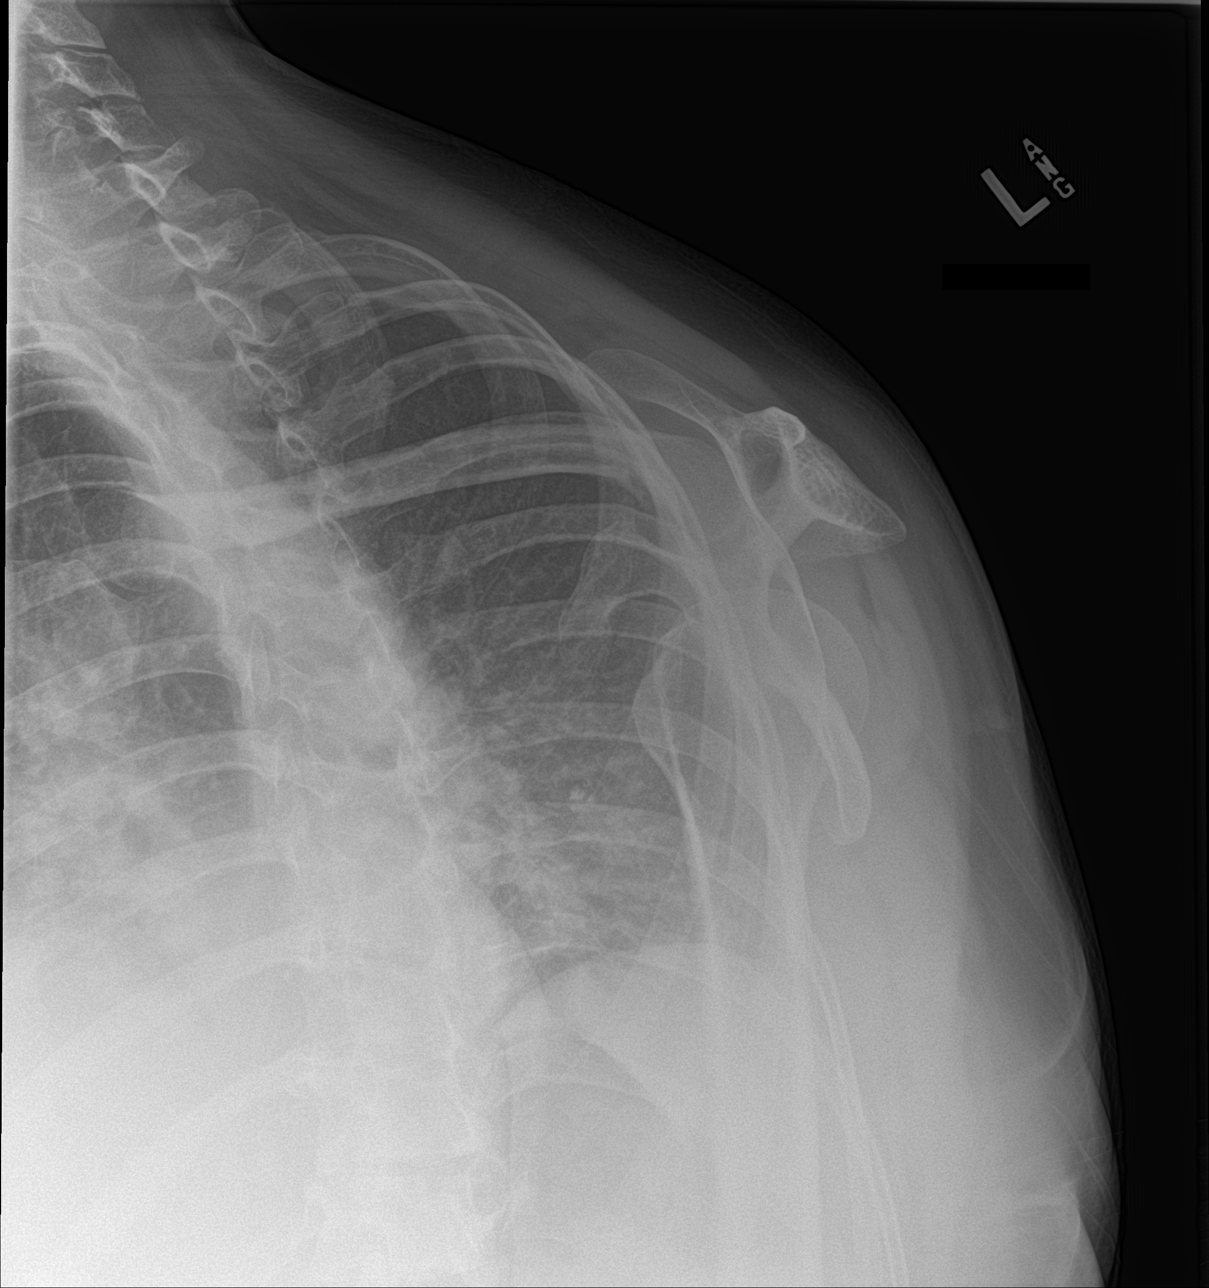

[shoulder obl (2 of 2)]
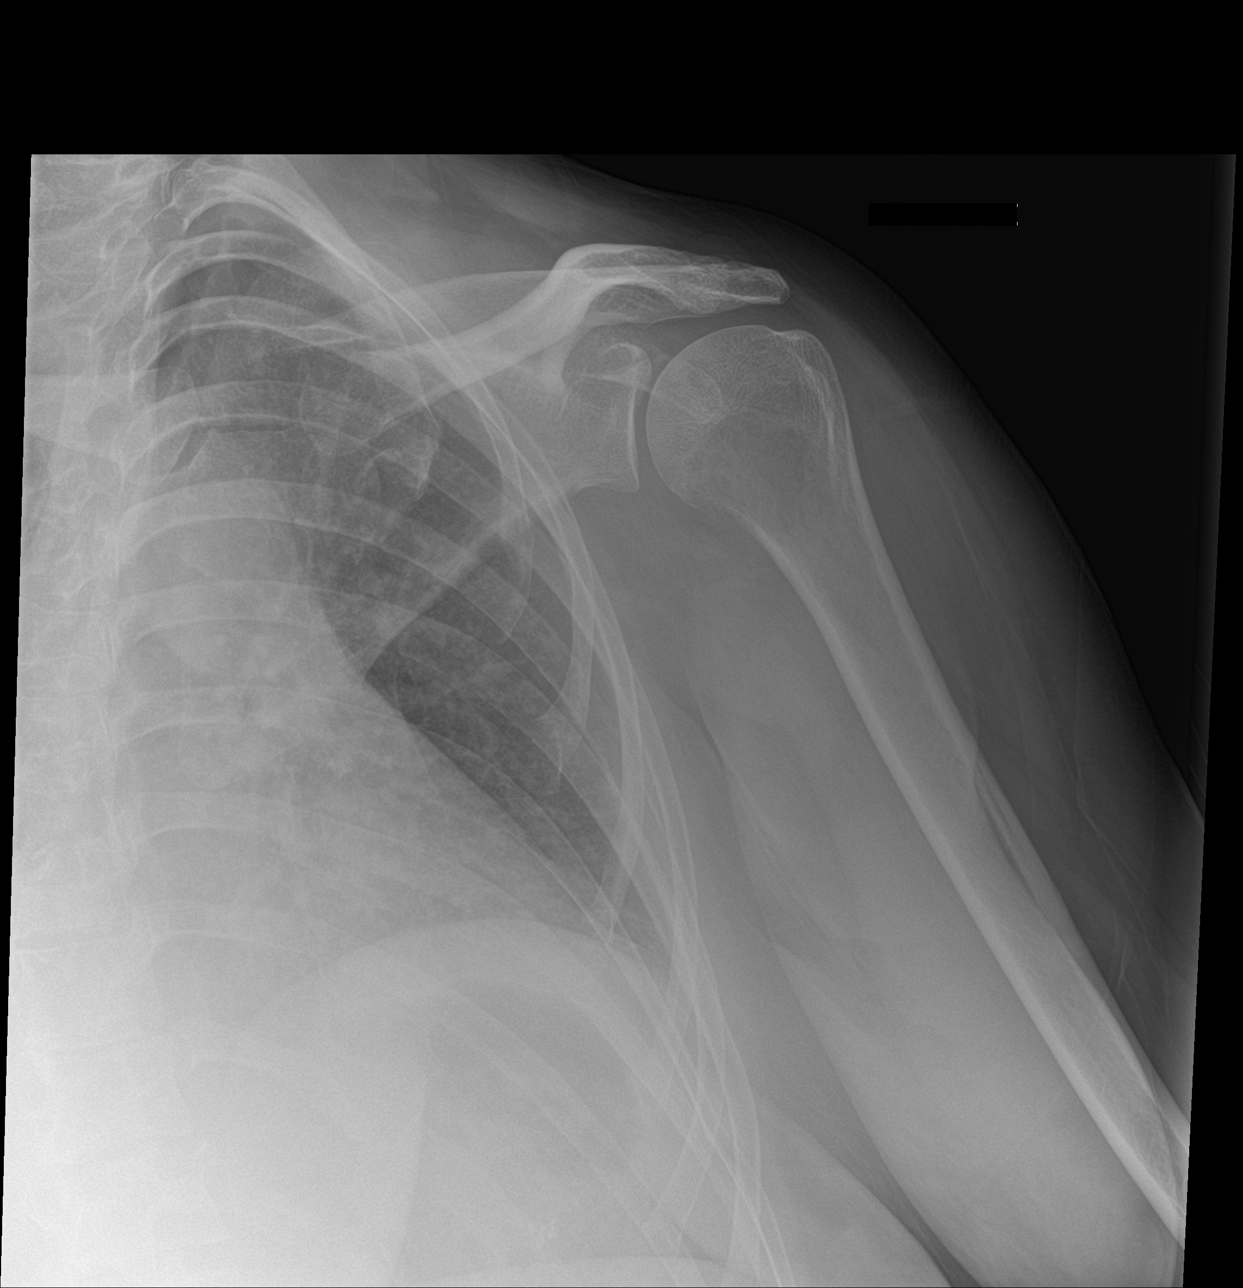

[3 of 3 positions shown; findings below may reference images not displayed]

FINDINGS: There is no evidence of fracture or dislocation. There is no
evidence of arthropathy or other focal bone abnormality. Soft
tissues are unremarkable.
IMPRESSION: No acute osseous abnormality.

## 2021-01-19 MED ORDER — POTASSIUM CHLORIDE CRYS ER 20 MEQ PO TBCR
40.0000 meq | EXTENDED_RELEASE_TABLET | Freq: Once | ORAL | Status: AC
  Administered 2021-01-19: 40 meq via ORAL
  Filled 2021-01-19: qty 2

## 2021-01-19 MED ORDER — K PHOS MONO-SOD PHOS DI & MONO 155-852-130 MG PO TABS
250.0000 mg | ORAL_TABLET | Freq: Two times a day (BID) | ORAL | Status: DC
  Administered 2021-01-19 – 2021-01-21 (×5): 250 mg via ORAL
  Filled 2021-01-19 (×6): qty 1

## 2021-01-19 MED ORDER — SODIUM CHLORIDE 0.9 % IV SOLN: 609.0000 mg | Freq: Once | INTRAVENOUS | Status: DC

## 2021-01-19 MED ORDER — POLYETHYLENE GLYCOL 3350 17 G PO PACK: 17.0000 g | PACK | Freq: Every day | ORAL | Status: DC | PRN

## 2021-01-19 MED ORDER — TRASTUZUMAB-DKST CHEMO 150 MG IV SOLR: 609.0000 mg | Freq: Once | INTRAVENOUS | Status: DC

## 2021-01-19 MED ORDER — OXYCODONE HCL 5 MG PO TABS
5.0000 mg | ORAL_TABLET | ORAL | Status: DC | PRN
  Administered 2021-01-20 – 2021-01-21 (×2): 5 mg via ORAL
  Filled 2021-01-19 (×2): qty 1

## 2021-01-19 NOTE — Progress Notes (Signed)
I tried to get Herceptin to be given today, but pharmacy had no availability. I wrote the order for Monica [name] to receive Herceptin tomorrow 01/20/21 at 9 AM-- this is a short infusion so even if she is transferred to [facility] tomorrow we should be done before noon. Nursing and pharmacy aware. I will see patient early AM tomorrow.  Again, appreciate the excellent care [name] is receiving on 6E!  GM

## 2021-01-19 NOTE — Progress Notes (Signed)
Palliative Care Progress Note  Monica [name] continues to have good pain management with scheduled hydromorphone. If sitting in bed without twisting or moving she has no pain. When she has to transfer from bed to bedside commode she has severe excruciating pain-complete inability to bear weight on her right leg. She is on strict bedrest due to high risk for pathologic fracture.  She needs urgent transfer to tertiary center for ortho-onc intervention for femur stabilization/replacement. She is awaiting initiation of herceptin.   At this time tertiary centers including [facility] where ortho-onc consultant is located are at capacity and not accepting transfers.   I personally spoke with the [facility] on call for [facility] Dr. Wayna Chalet regarding transfer of Monica [name] for very specialized ortho-oncologic care already discussed with their ortho surgeon Dr. Magda Bernheim for repair of her femur and pelvis. Because of her high risk for pathologic fracture and intractable pain with weight bearing she must remain on complete bedrest and requires IV pain control which keeps her in the inpatient setting -an unfortunately also delays her systemic cancer treatment and other therapies only delivered in the outpatient setting.  Despite my request for compassionate consideration in this case  they continue to decline her for transfer due to "capacity" issues.  We may need to expand our geographic region for transfer or seek other options for getting Monica [name] the care that she needs.  Lane Hacker, DO Palliative Medicine   Time: 35 min Greater than 50%  of this time was spent counseling and coordinating care related to the above assessment and plan.

## 2021-01-19 NOTE — Progress Notes (Signed)
PROGRESS NOTE    Monica [name]  IPJ:[id number] DOB: 11-07-1970 DOA: 01/14/2021 PCP: [name], PA-C    No chief complaint on file.   Brief Narrative:  Patient 50 year old female history of metastatic breast cancer being followed by Dr. Jana Hakim presented with increasing pain mostly in the hips and lower extremities with difficulty ambulating.  Pain very severe that patient had to use a walker.  Patient admitted for pain control.  Patient also due to start Herceptin soon and 2D echo ordered.   Assessment & Plan:   Principal Problem:   Cancer related pain Active Problems:   Malignant neoplasm of upper-inner quadrant of right breast in female, estrogen receptor positive (HCC)   Bone metastases (HCC)   Pain   Hypercalcemia   1 cancer related pain secondary to history of widely metastatic breast cancer with osseous mets/concern for impending pathologic fracture -Patient with significant lower extremity pain leading to difficulty ambulating.  Patient lives on the second level with no elevator at home and will be unsafe to discharge with difficulty ambulating and pain uncontrolled. -CT pelvis done with new interval superior endplate compression fracture deformity at L4 with 20% loss of height centrally, no retropulsion, this reflects pathologic fracture.  Additional multifocal lytic/sclerotic osseous metastases in the visualized axial and appendicular skeleton, unchanged. -Patient currently on scheduled IV Dilaudid every 3 hours with some complaints of breakthrough pain.   -Resume oxycodone as needed for breakthrough pain. -Scheduled ibuprofen has been increased to 800 mg 3 times daily per palliative care.  -Continue scheduled Neurontin and calcitonin nasal spray. -IR reviewed films and concerned about pathologic femur fractures and recommended MRI of the pelvis with evaluation by orthopedics. -MRI pelvis and MRI right femur done with extensive osseous metastatic disease involving  lower lumbar vertebral bodies, sacrum, ilium, bilateral acetabulum, bilateral femurs.  Abnormal bone lesion encompassing nearly entirety of right femoral head and femoral neck with severe cortical destruction of anterior femoral head neck junction placing patient high risk for pathologic fracture.  No acute fracture identified on MRI, cortical irregularity on recent CT pelvis performed concerning for nondisplaced pathologic fracture. -MRI with abnormal bone lesion in anterior left femoral neck with focal severe cortical destruction placing patient high risk for pathologic fracture.  Abnormal bone lesion encompassing nearly entirety of left side of sacrum and ilium with possibly nondisplaced pathologic fracture of the left sacral alla.  Large right hip joint effusion.  Mild edema in the left gluteus minimus muscle likely reflecting mild muscle strain. -Patient currently on bedrest.  -Patient reassessed by Dr.Xu, orthopedics on 01/17/2021 who reviewed MRI findings and based on MRI findings feels patient is at high risk for impending pathologic fracture that would require proximal femur replacement which requires expertise of orthopedic oncologist at the tertiary care center such as [facility].   -Dr Erlinda Hong, orthopedics spoke with one of his colleagues Dr. Magda Bernheim at [facility] for his expertise in this matter and he graciously agreed to see patient in consultation once patient arrived at their center. -[facility] as well as [facility] health and was told no beds available as both hospitals currently at full capacity.  -Called and spoke with [facility] today and patient has been accepted and placed on the wait list. -Patient with complaints today of left shoulder pain.  Get plain films of the left shoulder. -Continue current pain regimen of scheduled IV Dilaudid, scheduled ibuprofen, Neurontin, calcitonin, trazodone as needed sleep.  We will add  oxycodone as needed for breakthrough pain. -Appreciate palliative care, oncology, IR, orthopedics input and recommendations.    2.  Metastatic breast cancer -2D echo with normal EF,NWMA -Currently on Xeloda. -Per oncology.  3.  Anemia of chronic disease -Likely anemia of chronic disease. -Patient with no overt bleeding. -Anemia panel consistent with anemia of chronic disease.   -Hemoglobin currently at 10.4 from 9.7 from 11.4, likely dilutional effect.  -Transfusion threshold hemoglobin < 7.  4.  Hypercalcemia -Likely hypercalcemia of malignancy. -Patient with metastatic breast cancer. -Continue IV fluids.   -Magnesium repleted and currently at 2.1.  Phosphorus at 2.3. -Status post IV pamidronate x1 (01/16/2021 ). -Continue calcitonin nasal spray, -Corrected calcium at 9.08 -Saline lock IV fluids -Oncology following.  5.  Hypokalemia/hypomagnesemia -Magnesium at 2.1.   -Potassium at 3.6.   -Repeat labs in the morning.   DVT prophylaxis: SCDs Code Status: Full Family Communication: Updated patient.  Updated mother at bedside.  Disposition:   Status is: Inpatient  The patient will require care spanning > 2 midnights and should be moved to inpatient because: Ongoing active pain requiring inpatient pain management  Dispo: The patient is from: Home              Anticipated d/c is to: Awaiting transfer to tertiary care center.  Patient currently on the wait list at [facility].              Patient currently is not medically stable to d/c.   Difficult to place patient No       Consultants:  Oncology: Dr. Jana Hakim. IR Orthopedics: Dr. Erlinda Hong 01/16/2021  Procedures:  CT abdomen and pelvis 01/14/2021 2D echo 01/14/2021 MRI right femur/MRI pelvis 01/16/2021 Plain films right femur 01/16/2021 Plain films of the left shoulder pending 01/19/2021  Antimicrobials:  None   Subjective: Sitting up in bed.  Patient no complaints of left shoulder pain that has been ongoing and felt may have  been related to using walker however has not used walking a few days and still with significant left shoulder pain.  Patient complain of breakthrough pain just prior to 30 minutes to 1 hour before scheduled Dilaudid is due.  No chest pain.  No shortness of breath.  No abdominal pain.  Tolerating current diet.    Objective: Vitals:   01/18/21 0828 01/18/21 1018 01/18/21 2023 01/19/21 0444  BP: (!) 134/91 (!) 132/95 (!) 146/97 132/83  Pulse: 86 91 81 82  Resp: 14 16 16 16   Temp: 98.6 F (37 C) 98.2 F (36.8 C) 98.8 F (37.1 C) 98.5 F (36.9 C)  TempSrc: Oral Oral Oral Oral  SpO2: 96% 98% 97% 97%  Weight:      Height:        Intake/Output Summary (Last 24 hours) at 01/19/2021 1324 Last data filed at 01/19/2021 0855 Gross per 24 hour  Intake 1763.47 ml  Output --  Net 1763.47 ml    Filed Weights   01/14/21 1740  Weight: 78 kg    Examination:  General exam: : NAD Respiratory system: CTA B anterior lung fields.  No wheezes, no rhonchi.  Speaking in full sentences.  Normal respiratory effort. Cardiovascular system: Regular rate and rhythm no murmurs rubs or gallops.  No JVD.  No lower extremity edema.  Gastrointestinal system: Abdomen soft, nontender, nondistended, positive bowel sounds.  No rebound.  No guarding. Central nervous system: Alert and oriented. No focal neurological deficits. Extremities: Symmetric 5 x 5 power. Skin: No rashes, lesions or ulcers Psychiatry:  Judgement and insight appear normal. Mood & affect appropriate.  Data Reviewed: I have personally reviewed following labs and imaging studies  CBC: Recent Labs  Lab 01/14/21 1255 01/16/21 0516 01/17/21 1014 01/18/21 0554 01/19/21 0605  WBC 10.6* 7.7 7.0 4.3 3.1*  NEUTROABS 8.3* 5.5 5.6 3.5  --   HGB 11.9* 12.2 11.4* 9.7* 10.4*  HCT 37.3 38.9 35.7* 30.2* 33.4*  MCV 94.4 95.1 95.5 94.1 95.2  PLT 270 278 234 156 166     Basic Metabolic Panel: Recent Labs  Lab 01/14/21 1255 01/16/21 0516  01/17/21 1014 01/18/21 0554 01/19/21 0605  NA 137 139 135 137 140  K 3.6 4.0 3.4* 3.4* 3.6  CL 97* 98 101 105 108  CO2 29 30 24 25 26   GLUCOSE 101* 107* 166* 95 96  BUN 18 15 13 10 8   CREATININE 0.84 0.85 0.76 0.74 0.66  CALCIUM 10.5* 11.1* 10.9* 8.8* 8.2*  MG  --  1.8 1.7 1.6* 2.1  PHOS  --  4.5 3.5  --  2.3*     GFR: Estimated Creatinine Clearance: 88.7 mL/min (by C-G formula based on SCr of 0.66 mg/dL).  Liver Function Tests: Recent Labs  Lab 01/14/21 1255 01/17/21 1014 01/19/21 0605  AST 82*  --   --   ALT 73*  --   --   ALKPHOS 166*  --   --   BILITOT 0.8  --   --   PROT 7.3  --   --   ALBUMIN 3.8 3.2* 2.9*     CBG: No results for input(s): GLUCAP in the last 168 hours.   Recent Results (from the past 240 hour(s))  SARS CORONAVIRUS 2 (TAT 6-24 HRS) Nasopharyngeal Nasopharyngeal Swab     Status: None   Collection Time: 01/14/21  4:43 PM   Specimen: Nasopharyngeal Swab  Result Value Ref Range Status   SARS Coronavirus 2 NEGATIVE NEGATIVE Final    Comment: (NOTE) SARS-CoV-2 target nucleic acids are NOT DETECTED.  The SARS-CoV-2 RNA is generally detectable in upper and lower respiratory specimens during the acute phase of infection. Negative results do not preclude SARS-CoV-2 infection, do not rule out co-infections with other pathogens, and should not be used as the sole basis for treatment or other patient management decisions. Negative results must be combined with clinical observations, patient history, and epidemiological information. The expected result is Negative.  Fact Sheet for Patients: SugarRoll.be  Fact Sheet for Healthcare Providers: https://www.woods-mathews.com/  This test is not yet approved or cleared by the [place] FDA and  has been authorized for detection and/or diagnosis of SARS-CoV-2 by FDA under an Emergency Use Authorization (EUA). This EUA will remain  in effect (meaning this  test can be used) for the duration of the COVID-19 declaration under Se ction 564(b)(1) of the Act, 21 U.S.C. section 360bbb-3(b)(1), unless the authorization is terminated or revoked sooner.  Performed at [facility], [place] [address], [place], [place] [address]           Radiology Studies: No results found.      Scheduled Meds:  bisacodyl  10 mg Rectal Once   calcitonin (salmon)  1 spray Alternating Nares Daily   capecitabine  1,500 mg Oral BID AC   enoxaparin (LOVENOX) injection  40 mg Subcutaneous Q24H   gabapentin  300 mg Oral TID    HYDROmorphone (DILAUDID) injection  1 mg Intravenous Q3H   ibuprofen  800 mg Oral TID   ondansetron (ZOFRAN) IV  4 mg Intravenous TID AC & HS   pantoprazole  40 mg Oral Daily   phosphorus  250 mg Oral BID   polyethylene glycol  17 g Oral BID   senna-docusate  2 tablet Oral BID   [START ON 01/20/2021] trastuzumab-dkst (OGIVRI) CHEMO IV infusion  609 mg Intravenous Once   Continuous Infusions:    LOS: 5 days    Time spent: 35 minutes    Irine Seal, MD [facility]   To contact the attending provider between 7A-7P or the covering provider during after hours 7P-7A, please log into the web site [email or web address] and access using universal [place] password for that web site. If you do not have the password, please call the hospital operator.  01/19/2021, 1:24 PM

## 2021-01-20 ENCOUNTER — Ambulatory Visit
Admit: 2021-01-20 | Discharge: 2021-01-20 | Disposition: A | Discharge status: Home / Self Care | Payer: MEDICAID | Attending: Radiation Oncology | Admitting: Radiation Oncology

## 2021-01-20 ENCOUNTER — Telehealth: Payer: Self-pay | Admitting: Oncology

## 2021-01-20 DIAGNOSIS — C7931 Secondary malignant neoplasm of brain: Secondary | ICD-10-CM

## 2021-01-20 DIAGNOSIS — C50211 Malignant neoplasm of upper-inner quadrant of right female breast: Secondary | ICD-10-CM

## 2021-01-20 LAB — RENAL FUNCTION PANEL
Albumin: 2.6 g/dL — ABNORMAL LOW (ref 3.5–5.0)
Anion gap: 6 (ref 5–15)
BUN: 8 mg/dL (ref 6–20)
CO2: 25 mmol/L (ref 22–32)
Calcium: 7.6 mg/dL — ABNORMAL LOW (ref 8.9–10.3)
Chloride: 107 mmol/L (ref 98–111)
Creatinine, Ser: 0.53 mg/dL (ref 0.44–1.00)
GFR, Estimated: >60 mL/min (ref >60)
Glucose, Bld: 88 mg/dL (ref 70–99)
Phosphorus: 2.5 mg/dL (ref 2.5–4.6)
Potassium: 3.6 mmol/L (ref 3.5–5.1)
Sodium: 138 mmol/L (ref 135–145)

## 2021-01-20 LAB — CBC WITH DIFFERENTIAL/PLATELET
Abs Immature Granulocytes: 0.08 10*3/uL — ABNORMAL HIGH (ref 0.00–0.07)
Basophils Absolute: 0 10*3/uL (ref 0.0–0.1)
Basophils Relative: 1 %
Eosinophils Absolute: 0.2 10*3/uL (ref 0.0–0.5)
Eosinophils Relative: 5 %
HCT: 29.4 % — ABNORMAL LOW (ref 36.0–46.0)
Hemoglobin: 9.4 g/dL — ABNORMAL LOW (ref 12.0–15.0)
Immature Granulocytes: 2 %
Lymphocytes Relative: 10 %
Lymphs Abs: 0.4 10*3/uL — ABNORMAL LOW (ref 0.7–4.0)
MCH: 30.2 pg (ref 26.0–34.0)
MCHC: 32 g/dL (ref 30.0–36.0)
MCV: 94.5 fL (ref 80.0–100.0)
Monocytes Absolute: 0.7 10*3/uL (ref 0.1–1.0)
Monocytes Relative: 16 %
Neutro Abs: 2.9 10*3/uL (ref 1.7–7.7)
Neutrophils Relative %: 66 %
Platelets: 143 10*3/uL — ABNORMAL LOW (ref 150–400)
RBC: 3.11 MIL/uL — ABNORMAL LOW (ref 3.87–5.11)
RDW: 15.1 % (ref 11.5–15.5)
WBC: 4.2 10*3/uL (ref 4.0–10.5)
nRBC: 0 % (ref 0.0–0.2)

## 2021-01-20 LAB — MAGNESIUM: Magnesium: 1.8 mg/dL (ref 1.7–2.4)

## 2021-01-20 MED ORDER — TRASTUZUMAB-DKST CHEMO 150 MG IV SOLR
600.0000 mg | Freq: Once | INTRAVENOUS | Status: AC
  Administered 2021-01-20: 600 mg via INTRAVENOUS
  Filled 2021-01-20: qty 28.57

## 2021-01-20 MED ORDER — DIPHENHYDRAMINE HCL 25 MG PO CAPS
25.0000 mg | ORAL_CAPSULE | Freq: Once | ORAL | Status: AC
  Administered 2021-01-20: 25 mg via ORAL
  Filled 2021-01-20: qty 1

## 2021-01-20 MED ORDER — ACETAMINOPHEN 325 MG PO TABS
650.0000 mg | ORAL_TABLET | Freq: Once | ORAL | Status: AC
  Administered 2021-01-20: 650 mg via ORAL
  Filled 2021-01-20: qty 2

## 2021-01-20 MED ORDER — DOXYCYCLINE HYCLATE 100 MG PO TABS
100.0000 mg | ORAL_TABLET | Freq: Every day | ORAL | Status: DC
  Administered 2021-01-20 – 2021-01-21 (×2): 100 mg via ORAL
  Filled 2021-01-20 (×2): qty 1

## 2021-01-20 MED ORDER — CAPECITABINE 500 MG PO TABS
1500.0000 mg | ORAL_TABLET | Freq: Two times a day (BID) | ORAL | Status: AC
  Administered 2021-01-20 – 2021-01-21 (×4): 1500 mg via ORAL

## 2021-01-20 MED ORDER — SODIUM CHLORIDE 0.9 % IV SOLN: Freq: Once | INTRAVENOUS | Status: AC

## 2021-01-20 NOTE — TOC Progression Note (Signed)
Per MD, pt needing financial navigator follow up. Message left for Vicente Males in Financial counseling to refer. MD anticipating pt will transfer to tertiary care center for surgery. TOC will follow and assist as needed.

## 2021-01-20 NOTE — Progress Notes (Signed)
Patient ID: Monica [name], female   DOB: 20-Feb-1971, 50 y.o.   MRN: [id number] Patient awaiting transfer to tertiary care center for ortho-onc intervention for femur stabilization/ replacement. Dr. Laurence Ferrari will plan to reach out to patient via phone on 7/18 to check status.

## 2021-01-20 NOTE — Progress Notes (Signed)
PROGRESS NOTE    Monica [name]  XBM:[id number] DOB: 02/06/1971 DOA: 01/14/2021 PCP: [name], PA-C    No chief complaint on file.   Brief Narrative:  Patient 50 year old female history of metastatic breast cancer being followed by Dr. Jana Hakim presented with increasing pain mostly in the hips and lower extremities with difficulty ambulating.  Pain very severe that patient had to use a walker.  Patient admitted for pain control.  Patient also due to start Herceptin soon and 2D echo ordered.   Assessment & Plan:   Principal Problem:   Cancer related pain Active Problems:   Malignant neoplasm of upper-inner quadrant of right breast in female, estrogen receptor positive (HCC)   Bone metastases (HCC)   Pain   Hypercalcemia   1 cancer related pain secondary to history of widely metastatic breast cancer with osseous mets/concern for impending pathologic fracture -Patient with significant lower extremity pain leading to difficulty ambulating.  Patient lives on the second level with no elevator at home and will be unsafe to discharge with difficulty ambulating and pain uncontrolled. -CT pelvis done with new interval superior endplate compression fracture deformity at L4 with 20% loss of height centrally, no retropulsion, this reflects pathologic fracture.  Additional multifocal lytic/sclerotic osseous metastases in the visualized axial and appendicular skeleton, unchanged. -Patient currently on scheduled IV Dilaudid every 3 hours with some complaints of breakthrough pain.   -Resume oxycodone as needed for breakthrough pain. -Scheduled ibuprofen has been increased to 800 mg 3 times daily per palliative care.  -Continue scheduled Neurontin and calcitonin nasal spray. -IR reviewed films and concerned about pathologic femur fractures and recommended MRI of the pelvis with evaluation by orthopedics. -MRI pelvis and MRI right femur done with extensive osseous metastatic disease involving  lower lumbar vertebral bodies, sacrum, ilium, bilateral acetabulum, bilateral femurs.  Abnormal bone lesion encompassing nearly entirety of right femoral head and femoral neck with severe cortical destruction of anterior femoral head neck junction placing patient high risk for pathologic fracture.  No acute fracture identified on MRI, cortical irregularity on recent CT pelvis performed concerning for nondisplaced pathologic fracture. -MRI with abnormal bone lesion in anterior left femoral neck with focal severe cortical destruction placing patient high risk for pathologic fracture.  Abnormal bone lesion encompassing nearly entirety of left side of sacrum and ilium with possibly nondisplaced pathologic fracture of the left sacral alla.  Large right hip joint effusion.  Mild edema in the left gluteus minimus muscle likely reflecting mild muscle strain. -Patient currently on bedrest.  -Patient reassessed by Dr.Xu, orthopedics on 01/17/2021 who reviewed MRI findings and based on MRI findings feels patient is at high risk for impending pathologic fracture that would require proximal femur replacement which requires expertise of orthopedic oncologist at the tertiary care center such as [facility].   -Dr Erlinda Hong, orthopedics spoke with one of his colleagues Dr. Magda Bernheim at [facility] for his expertise in this matter and he graciously agreed to see patient in consultation once patient arrived at their center. -[facility] as well as [facility] health and was told no beds available as both hospitals currently at full capacity.  -Called and spoke with [facility] and patient has been accepted and placed on the wait list. -Patient with complaints of left shoulder pain, plain films of the left shoulder negative for any acute abnormalities. -Continue current pain regimen of scheduled IV Dilaudid, scheduled ibuprofen, Neurontin, calcitonin, oxycodone as needed  breakthrough pain, trazodone  as needed sleep.   -Appreciate palliative care, oncology, IR, orthopedics input and recommendations.    2.  Metastatic breast cancer -2D echo with normal EF,NWMA -Currently on Xeloda. -Patient received a dose of Herceptin this morning per oncology. -Per oncology.  3.  Anemia of chronic disease -Likely anemia of chronic disease. -Patient with no overt bleeding. -Anemia panel consistent with anemia of chronic disease.   -Hemoglobin currently at 9.4 from 10.4 from 9.7 from 11.4, likely dilutional effect.  -Transfusion threshold hemoglobin < 7.  4.  Hypercalcemia -Likely hypercalcemia of malignancy. -Patient with metastatic breast cancer. -Magnesium repleted and currently at 1.8.  Phosphorus at 2.5. -Status post IV pamidronate x1 (01/16/2021 ). -Corrected calcium at 8.72.   -Calcitonin nasal spray has been discontinued per oncology.   -IV fluids saline lock.   -Per oncology.  5.  Hypokalemia/hypomagnesemia -Magnesium at 1.8.   -Potassium at 3.6.   -Follow.   DVT prophylaxis: SCDs Code Status: Full Family Communication: Updated patient.  Updated mother at bedside.  Disposition:   Status is: Inpatient  The patient will require care spanning > 2 midnights and should be moved to inpatient because: Ongoing active pain requiring inpatient pain management  Dispo: The patient is from: Home              Anticipated d/c is to: Awaiting transfer to tertiary care center.  Patient currently on the wait list at [facility].              Patient currently is not medically stable to d/c.   Difficult to place patient No       Consultants:  Oncology: Dr. Jana Hakim. IR Orthopedics: Dr. Erlinda Hong 01/16/2021 Palliative care: Dr. Hilma Favors  Procedures:  CT abdomen and pelvis 01/14/2021 2D echo 01/14/2021 MRI right femur/MRI pelvis 01/16/2021 Plain films right femur 01/16/2021 Plain films of the left shoulder 01/19/2021  Antimicrobials:  None   Subjective: Sitting up in bed.   When laying still not in significant pain but when attempts to move around is in significant pain.  States scheduled IV Dilaudid wears off 30 minutes to an hour prior to next dose.  Denies any chest pain.  No abdominal pain.  Still with some left shoulder pain.  Tolerating current diet.  Mother at bedside.  Patient noted to have received a dose of Herceptin this morning.  Objective: Vitals:   01/19/21 0444 01/19/21 1428 01/19/21 2112 01/20/21 0434  BP: 132/83 (!) 133/94 131/81 117/89  Pulse: 82 86 91 77  Resp: 16 16 16 16   Temp: 98.5 F (36.9 C) 98.1 F (36.7 C) 99.1 F (37.3 C) 98.2 F (36.8 C)  TempSrc: Oral Axillary Oral Oral  SpO2: 97% 96% 98% 98%  Weight:      Height:        Intake/Output Summary (Last 24 hours) at 01/20/2021 1334 Last data filed at 01/20/2021 1300 Gross per 24 hour  Intake 720 ml  Output --  Net 720 ml    Filed Weights   01/14/21 1740  Weight: 78 kg    Examination:  General exam: : NAD Respiratory system: CTA B.  No wheezes, no rhonchi, no crackles.  Speaking in full sentences.  Normal respiratory effort.   Cardiovascular system: RRR no murmurs rubs or gallops.  No JVD.  No lower extremity edema.  Gastrointestinal system: Abdomen is soft, nontender, nondistended, positive bowel sounds.  No rebound.  No guarding.  Central nervous system: Alert and oriented. No focal neurological deficits. Extremities: Symmetric 5 x  5 power. Skin: No rashes, lesions or ulcers Psychiatry: Judgement and insight appear normal. Mood & affect appropriate.  Data Reviewed: I have personally reviewed following labs and imaging studies  CBC: Recent Labs  Lab 01/14/21 1255 01/16/21 0516 01/17/21 1014 01/18/21 0554 01/19/21 0605 01/20/21 0507  WBC 10.6* 7.7 7.0 4.3 3.1* 4.2  NEUTROABS 8.3* 5.5 5.6 3.5  --  2.9  HGB 11.9* 12.2 11.4* 9.7* 10.4* 9.4*  HCT 37.3 38.9 35.7* 30.2* 33.4* 29.4*  MCV 94.4 95.1 95.5 94.1 95.2 94.5  PLT 270 278 234 156 166 143*     Basic  Metabolic Panel: Recent Labs  Lab 01/16/21 0516 01/17/21 1014 01/18/21 0554 01/19/21 0605 01/20/21 0507  NA 139 135 137 140 138  K 4.0 3.4* 3.4* 3.6 3.6  CL 98 101 105 108 107  CO2 30 24 25 26 25   GLUCOSE 107* 166* 95 96 88  BUN 15 13 10 8 8   CREATININE 0.85 0.76 0.74 0.66 0.53  CALCIUM 11.1* 10.9* 8.8* 8.2* 7.6*  MG 1.8 1.7 1.6* 2.1 1.8  PHOS 4.5 3.5  --  2.3* 2.5     GFR: Estimated Creatinine Clearance: 88.7 mL/min (by C-G formula based on SCr of 0.53 mg/dL).  Liver Function Tests: Recent Labs  Lab 01/14/21 1255 01/17/21 1014 01/19/21 0605 01/20/21 0507  AST 82*  --   --   --   ALT 73*  --   --   --   ALKPHOS 166*  --   --   --   BILITOT 0.8  --   --   --   PROT 7.3  --   --   --   ALBUMIN 3.8 3.2* 2.9* 2.6*     CBG: No results for input(s): GLUCAP in the last 168 hours.   Recent Results (from the past 240 hour(s))  SARS CORONAVIRUS 2 (TAT 6-24 HRS) Nasopharyngeal Nasopharyngeal Swab     Status: None   Collection Time: 01/14/21  4:43 PM   Specimen: Nasopharyngeal Swab  Result Value Ref Range Status   SARS Coronavirus 2 NEGATIVE NEGATIVE Final    Comment: (NOTE) SARS-CoV-2 target nucleic acids are NOT DETECTED.  The SARS-CoV-2 RNA is generally detectable in upper and lower respiratory specimens during the acute phase of infection. Negative results do not preclude SARS-CoV-2 infection, do not rule out co-infections with other pathogens, and should not be used as the sole basis for treatment or other patient management decisions. Negative results must be combined with clinical observations, patient history, and epidemiological information. The expected result is Negative.  Fact Sheet for Patients: SugarRoll.be  Fact Sheet for Healthcare Providers: https://www.woods-mathews.com/  This test is not yet approved or cleared by the [place] FDA and  has been authorized for detection and/or diagnosis of  SARS-CoV-2 by FDA under an Emergency Use Authorization (EUA). This EUA will remain  in effect (meaning this test can be used) for the duration of the COVID-19 declaration under Se ction 564(b)(1) of the Act, 21 U.S.C. section 360bbb-3(b)(1), unless the authorization is terminated or revoked sooner.  Performed at [facility], [place] [address] [place], [place] [address]           Radiology Studies: DG Shoulder Left  Result Date: 01/19/2021 CLINICAL DATA:  Shoulder pain EXAM: LEFT SHOULDER - 2+ VIEW COMPARISON:  Chest CT Nov 30, 2020 and radiograph December 28, 2020. FINDINGS: There is no evidence of fracture or dislocation. There is no evidence of arthropathy or other focal  bone abnormality. Soft tissues are unremarkable. IMPRESSION: No acute osseous abnormality. Electronically Signed   By: Dahlia Bailiff MD   On: 01/19/2021 15:59        Scheduled Meds:  bisacodyl  10 mg Rectal Once   capecitabine  1,500 mg Oral BID AC   doxycycline  100 mg Oral Daily   enoxaparin (LOVENOX) injection  40 mg Subcutaneous Q24H   gabapentin  300 mg Oral TID    HYDROmorphone (DILAUDID) injection  1 mg Intravenous Q3H   ibuprofen  800 mg Oral TID   ondansetron (ZOFRAN) IV  4 mg Intravenous TID AC & HS   pantoprazole  40 mg Oral Daily   phosphorus  250 mg Oral BID   senna-docusate  2 tablet Oral BID   Continuous Infusions:    LOS: 6 days    Time spent: 35 minutes    Irine Seal, MD [facility]   To contact the attending provider between 7A-7P or the covering provider during after hours 7P-7A, please log into the web site [email or web address] and access using universal [place] password for that web site. If you do not have the password, please call the hospital operator.  01/20/2021, 1:34 PM

## 2021-01-20 NOTE — Progress Notes (Signed)
Monica [name]   DOB:[birth year]   ZO#:[id number]   JWJ#:[id number]  Subjective:  Generally uneventful weekend; had left shoulder pain, evaluated with plain films which show no fracture or lesion; BMs now well controlled, not needing Miralax daily (about QOD);completing first cycle of capecitabine w/o complications; awaiting teransfer  Objective: White woman examined in bed Vitals:   01/19/21 2112 01/20/21 0434  BP: 131/81 117/89  Pulse: 91 77  Resp: 16 16  Temp: 99.1 F (37.3 C) 98.2 F (36.8 C)  SpO2: 98% 98%    Body mass index is 27.75 kg/m.  Intake/Output Summary (Last 24 hours) at 01/20/2021 0732 Last data filed at 01/19/2021 2115 Gross per 24 hour  Intake 360 ml  Output --  Net 360 ml     CBG (last 3)  No results for input(s): GLUCAP in the last 72 hours.   Labs:  Lab Results  Component Value Date   WBC 4.2 01/20/2021   HGB 9.4 (L) 01/20/2021   HCT 29.4 (L) 01/20/2021   MCV 94.5 01/20/2021   PLT 143 (L) 01/20/2021   NEUTROABS 2.9 01/20/2021    _0 @  Urine Studies No results for input(s): UHGB, CRYS in the last 72 hours.  Invalid input(s): UACOL, UAPR, USPG, UPH, UTP, UGL, [place], UBIL, UNIT, UROB, [place], UEPI, UWBC, Duwayne Heck [place], [place]  Basic Metabolic Panel: Recent Labs  Lab 01/16/21 0516 01/17/21 1014 01/18/21 0554 01/19/21 0605 01/20/21 0507  NA 139 135 137 140 138  K 4.0 3.4* 3.4* 3.6 3.6  CL 98 101 105 108 107  CO2 _1 GLUCOSE 107* 166* 95 96 88  BUN _2 CREATININE 0.85 0.76 0.74 0.66 0.53  CALCIUM 11.1* 10.9* 8.8* 8.2* 7.6*  MG 1.8 1.7 1.6* 2.1 1.8  PHOS 4.5 3.5  --  2.3* 2.5   GFR Estimated Creatinine Clearance: 88.7 mL/min (by C-G formula based on SCr of 0.53 mg/dL). Liver Function Tests: Recent Labs  Lab 01/14/21 1255 01/17/21 1014 01/19/21 0605 01/20/21 0507  AST 82*  --   --   --   ALT 73*  --   --   --   ALKPHOS 166*  --   --   --   BILITOT 0.8  --   --   --   PROT 7.3  --   --   --    ALBUMIN 3.8 3.2* 2.9* 2.6*   No results for input(s): LIPASE, AMYLASE in the last 168 hours. No results for input(s): AMMONIA in the last 168 hours. Coagulation profile No results for input(s): INR, PROTIME in the last 168 hours.  CBC: Recent Labs  Lab 01/14/21 1255 01/16/21 0516 01/17/21 1014 01/18/21 0554 01/19/21 0605 01/20/21 0507  WBC 10.6* 7.7 7.0 4.3 3.1* 4.2  NEUTROABS 8.3* 5.5 5.6 3.5  --  2.9  HGB 11.9* 12.2 11.4* 9.7* 10.4* 9.4*  HCT 37.3 38.9 35.7* 30.2* 33.4* 29.4*  MCV 94.4 95.1 95.5 94.1 95.2 94.5  PLT 270 278 234 156 166 143*   Cardiac Enzymes: No results for input(s): CKTOTAL, CKMB, CKMBINDEX, TROPONINI in the last 168 hours. BNP: Invalid input(s): POCBNP CBG: No results for input(s): GLUCAP in the last 168 hours. D-Dimer No results for input(s): DDIMER in the last 72 hours. Hgb A1c No results for input(s): HGBA1C in the last 72 hours. Lipid Profile No results for input(s): CHOL, HDL, LDLCALC, TRIG, CHOLHDL, LDLDIRECT in the last 72 hours. Thyroid function studies No results for input(s):  TSH, T4TOTAL, T3FREE, THYROIDAB in the last 72 hours.  Invalid input(s): FREET3 Anemia work up No results for input(s): VITAMINB12, FOLATE, FERRITIN, TIBC, IRON, RETICCTPCT in the last 72 hours.  Microbiology Recent Results (from the past 240 hour(s))  SARS CORONAVIRUS 2 (TAT 6-24 HRS) Nasopharyngeal Nasopharyngeal Swab     Status: None   Collection Time: 01/14/21  4:43 PM   Specimen: Nasopharyngeal Swab  Result Value Ref Range Status   SARS Coronavirus 2 NEGATIVE NEGATIVE Final    Comment: (NOTE) SARS-CoV-2 target nucleic acids are NOT DETECTED.  The SARS-CoV-2 RNA is generally detectable in upper and lower respiratory specimens during the acute phase of infection. Negative results do not preclude SARS-CoV-2 infection, do not rule out co-infections with other pathogens, and should not be used as the sole basis for treatment or other patient management  decisions. Negative results must be combined with clinical observations, patient history, and epidemiological information. The expected result is Negative.  Fact Sheet for Patients: SugarRoll.be  Fact Sheet for Healthcare Providers: https://www.woods-mathews.com/  This test is not yet approved or cleared by the [place] FDA and  has been authorized for detection and/or diagnosis of SARS-CoV-2 by FDA under an Emergency Use Authorization (EUA). This EUA will remain  in effect (meaning this test can be used) for the duration of the COVID-19 declaration under Se ction 564(b)(1) of the Act, 21 U.S.C. section 360bbb-3(b)(1), unless the authorization is terminated or revoked sooner.  Performed at [facility], [place] [address], [place], [place] [address]       Studies:  DG Shoulder Left  Result Date: 01/19/2021 CLINICAL DATA:  Shoulder pain EXAM: LEFT SHOULDER - 2+ VIEW COMPARISON:  Chest CT Nov 30, 2020 and radiograph December 28, 2020. FINDINGS: There is no evidence of fracture or dislocation. There is no evidence of arthropathy or other focal bone abnormality. Soft tissues are unremarkable. IMPRESSION: No acute osseous abnormality. Electronically Signed   By: Dahlia Bailiff MD   On: 01/19/2021 15:59    Assessment: 50 y.o., [place] woman status post right breast upper inner quadrant biopsy 12/29/2017, for a clinical T1c N0, stage IA invasive ductal carcinoma, triple positive, with an MIB-1 of 80%.   (1) genetics testing 02/02/2018 though the CancerNext gene panel offered by [facility] showed no deleterious mutations in  APC, ATM, BARD1, BMPR1A,BRCA1, BRCA2, BRIP1, CDH1, CDK4, CDKN2A, CHEK2, DICER1, HOXB13, MLH1, MRE11A, MSH2, MSH6, MUTYH, NBN, NF1, PALB2, PMS2, POLD1, POLE, PTEN, RAD50, RAD51C, RAD51D, SMAD4, SMARCA4, STK11 and TP53 (sequencing and deletion/duplication); EPCAM and GREM1 (deletion/duplication only).             (a) a  variant of unknown significance noted in MSH6  (p.V110I (c.328G>A) )    (2) right lumpectomy and sentinel lymph node sampling 02/16/2018 showed a pT2 pN0, stage IB invasive ductal carcinoma, grade 3, with a positive inferior margin; a total of 5 lymph nodes were removed             (a) additional surgery 02/27/2018 cleared the margins   (3) started carboplatin, docetaxel, trastuzumab and pertuzumab 03/11/2018, repeated every 21 days x 6, last dose 07/18/2018.               (a) Pertuzumab omitted after cycle 1 due to diarrhea.             (b) Gemcitabine substituted for docetaxel starting with cycle 5 due to peripheral neuropathy   (4) continued trastuzumab to total 6 months (through February 2020).             (  a) echocardiogram 01/21/2018 showed an ejection fraction in the 55-60% range             (b) repeat echocardiogram 06/14/2018 showed an ejection fraction in the 60-65%   (5) adjuvant radiation 08/15/2018 - 09/28/2018  Site/dose:   The patient initially received a dose of 50.4 Gy in 28 fractions to the right breast using whole-breast tangent fields. This was delivered using a 3-D conformal technique. The patient then received a boost to the seroma. This delivered an additional 10 Gy in 5 fractions using 12E, 9E electrons with a special teletherapy technique. The total dose was 60.4 Gy.    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, involving brain, liver, lungs, and bones (7) presenting 11/29/2020 with progressive back pain:             (a) non-contrast CT abd/pelvis (renal stone study) 11/29/2020 shows bone metastases             (b) CT chest/abd/pelvis with contrast 11/30/2020 shows multiple large liver masses, multiple pulmonary nodules, and widespread  lytic bone lesions             (c) MRI brain shows at least 4 brain lesions, largest 3.8 cm             (d) total spinal MRI 11/30/2020 shows pathologic fractures at T9, T12, possibly L2, as well as numerous other spine lesions, but no epidural enhancement or cord compression             (e) liver biopsy 12/02/2020 shows adenocarcinoma, prognostic panel again triple positive             (f) pre-op repeat MRI 12/01/2020 shows additional brain lesions, at least 8 of which will be targets for SRS   (8) CNS treatment:             (a) [place] 12/05/2020             (b) surgery 12/06/2020 confirmed metastatic carcinoma, estrogen receptor positive, HER2 amplified, progesterone receptor negative, with an MIB-1 of 70%.   (9) XRT to spine 12/05/2020 through 12/23/2020 Site Technique Total Dose (Gy) Dose per Fx (Gy) Completed Fx Beam Energies  Thoracic Spine: Spine_T9-L3 Complex 30/30 3 10/10 15X  Pelvis: Pelvis_sacrum Complex 30/30 3 10/10 15X    (10) advanced directives-- plans to name son as HCPOA with her father as secondary   (41) started capecitabine 01/08/2021, to start trastuzumab 01/22/2021, tucatinib 01/29/2021  (A) echo 01/14/2021 shows an excellent EF   (12) to start denosumab/Xgeva 01/22/2021, repeated every 6 weeks  (A) received aredia/pamidronate 01/16/2021     Plan: Discussed trastuzumab with her again and she has good understanding of the possible toxicities, side effects and complications; echo adequate; will proceed with first dose this AM. Should be done before noon so should not interfere with transfer plans if bed opens at [facility]  Now hypocalcemic; d/c'd calcitonin.  Has developed mild rosacea; added doxycycline.  I am hoping she will be transferred later today so she can proceed to [facility] for surgery as planned. I am not going to resume capecitabine or  start tucatinib until 10 days post-op. Next herceptin dose will be in 21 days  Will follow with you while in - house.  Chauncey Cruel, MD 01/20/2021  7:32 AM Medical Oncology and H[facility]ology [facility] [address] [place], [place] [address] Tel. [phone]    Fax. [phone]

## 2021-01-20 NOTE — Telephone Encounter (Signed)
Cancelled appointment per 07/11 sch msg. Patient is aware.

## 2021-01-20 NOTE — Progress Notes (Signed)
Spoke with [facility] transfer services. They confirmed that patient is still on the waiting list but they don't know when they will have a bed available due to high acuity and ED census. Representative stated that they will reach out once bed is available. MD made aware.

## 2021-01-21 ENCOUNTER — Encounter: Payer: Self-pay | Admitting: Oncology

## 2021-01-21 DIAGNOSIS — E876 Hypokalemia: Secondary | ICD-10-CM

## 2021-01-21 DIAGNOSIS — C50919 Malignant neoplasm of unspecified site of unspecified female breast: Secondary | ICD-10-CM | POA: Insufficient documentation

## 2021-01-21 DIAGNOSIS — M84551A Pathological fracture in neoplastic disease, right femur, initial encounter for fracture: Secondary | ICD-10-CM | POA: Insufficient documentation

## 2021-01-21 LAB — RENAL FUNCTION PANEL
Albumin: 2.7 g/dL — ABNORMAL LOW (ref 3.5–5.0)
Anion gap: 5 (ref 5–15)
BUN: 9 mg/dL (ref 6–20)
CO2: 26 mmol/L (ref 22–32)
Calcium: 8.1 mg/dL — ABNORMAL LOW (ref 8.9–10.3)
Chloride: 109 mmol/L (ref 98–111)
Creatinine, Ser: 0.63 mg/dL (ref 0.44–1.00)
GFR, Estimated: >60 mL/min (ref >60)
Glucose, Bld: 93 mg/dL (ref 70–99)
Phosphorus: 2.9 mg/dL (ref 2.5–4.6)
Potassium: 3.8 mmol/L (ref 3.5–5.1)
Sodium: 140 mmol/L (ref 135–145)

## 2021-01-21 LAB — CBC
HCT: 29.5 % — ABNORMAL LOW (ref 36.0–46.0)
Hemoglobin: 9.4 g/dL — ABNORMAL LOW (ref 12.0–15.0)
MCH: 30.3 pg (ref 26.0–34.0)
MCHC: 31.9 g/dL (ref 30.0–36.0)
MCV: 95.2 fL (ref 80.0–100.0)
Platelets: 143 10*3/uL — ABNORMAL LOW (ref 150–400)
RBC: 3.1 MIL/uL — ABNORMAL LOW (ref 3.87–5.11)
RDW: 15.8 % — ABNORMAL HIGH (ref 11.5–15.5)
WBC: 5 10*3/uL (ref 4.0–10.5)
nRBC: 0 % (ref 0.0–0.2)

## 2021-01-21 LAB — MAGNESIUM: Magnesium: 1.9 mg/dL (ref 1.7–2.4)

## 2021-01-21 MED ORDER — ENOXAPARIN SODIUM 40 MG/0.4ML IJ SOSY: 40.0000 mg | PREFILLED_SYRINGE | INTRAMUSCULAR | Status: DC

## 2021-01-21 MED ORDER — PROCHLORPERAZINE MALEATE 10 MG PO TABS: 10.0000 mg | ORAL_TABLET | Freq: Four times a day (QID) | ORAL | Status: DC

## 2021-01-21 MED ORDER — GABAPENTIN 300 MG PO CAPS: 300.0000 mg | ORAL_CAPSULE | Freq: Three times a day (TID) | ORAL | Status: DC

## 2021-01-21 MED ORDER — LIDOCAINE 5 % EX PTCH: 1.0000 | MEDICATED_PATCH | Freq: Every day | CUTANEOUS | Status: DC | PRN

## 2021-01-21 MED ORDER — OMEPRAZOLE 40 MG PO CPDR: 40.0000 mg | DELAYED_RELEASE_CAPSULE | Freq: Every day | ORAL | Status: DC

## 2021-01-21 MED ORDER — OXYCODONE HCL 5 MG PO TABS: 5.0000 mg | ORAL_TABLET | ORAL | Status: DC | PRN

## 2021-01-21 MED ORDER — ONDANSETRON 4 MG PO TBDP: 4.0000 mg | ORAL_TABLET | ORAL | Status: DC

## 2021-01-21 MED ORDER — SENNOSIDES-DOCUSATE SODIUM 8.6-50 MG PO TABS: 2.0000 | ORAL_TABLET | Freq: Two times a day (BID) | ORAL | Status: DC

## 2021-01-21 MED ORDER — K PHOS MONO-SOD PHOS DI & MONO 155-852-130 MG PO TABS: 250.0000 mg | ORAL_TABLET | Freq: Two times a day (BID) | ORAL | 0 refills | Status: AC

## 2021-01-21 MED ORDER — IBUPROFEN 800 MG PO TABS: 800.0000 mg | ORAL_TABLET | Freq: Three times a day (TID) | ORAL | 0 refills | Status: DC

## 2021-01-21 MED ORDER — DOXYCYCLINE HYCLATE 100 MG PO TABS: 100.0000 mg | ORAL_TABLET | Freq: Every day | ORAL | 0 refills | Status: AC

## 2021-01-21 MED ORDER — POLYETHYLENE GLYCOL 3350 17 G PO PACK: 17.0000 g | PACK | Freq: Every day | ORAL | 0 refills | Status: DC | PRN

## 2021-01-21 MED ORDER — ACETAMINOPHEN 325 MG PO TABS: 650.0000 mg | ORAL_TABLET | Freq: Four times a day (QID) | ORAL | Status: DC | PRN

## 2021-01-21 MED ORDER — HYDROMORPHONE HCL 1 MG/ML IJ SOLN: 1.0000 mg | INTRAMUSCULAR | 0 refills | Status: DC

## 2021-01-21 MED ORDER — TRAZODONE HCL 50 MG PO TABS: 50.0000 mg | ORAL_TABLET | Freq: Every evening | ORAL | Status: DC | PRN

## 2021-01-21 MED ORDER — OXYCODONE HCL 5 MG PO TABS: 5.0000 mg | ORAL_TABLET | ORAL | 0 refills | Status: DC | PRN

## 2021-01-21 NOTE — Discharge Summary (Signed)
Physician Discharge Summary  Monica [name] OXB:[id number] DOB: 1971/05/13 DOA: 01/14/2021  PCP: [name], PA-C  Admit date: 01/14/2021 Discharge date: 01/21/2021  Time spent: 60 minutes  Recommendations for Outpatient Follow-up:  Patient will be transferred to [facility]   Discharge Diagnoses:  Principal Problem:   Cancer related pain Active Problems:   Malignant neoplasm of upper-inner quadrant of right breast in female, estrogen receptor positive ([place])   Bone metastases ([place])   Pain   Hypercalcemia   Discharge Condition: Stable  Diet recommendation: Regular  Filed Weights   01/14/21 1740  Weight: 78 kg    History of present illness:  HPI per Dr. Warren Danes is a 50 y.o. female with history of metastatic breast cancer being followed by Dr. Jana Hakim has been having increasing pain mostly in the hip area making it difficult to ambulate.  Pain is very severe the patient has to use a walker to walk.  Dr. Jana Hakim patient's oncologist had requested admission to pain control.  Denies any nausea vomiting chest pain or shortness of breath.  Patient is due to start Herceptin soon and had a 2D echo done.  On exam patient not in distress.  Has significant pain on moving lower extremities.   ED Course: Patient was a direct admit.   Hospital Course:  1 cancer related pain secondary to history of widely metastatic breast cancer with osseous mets/concern for impending pathologic fracture -Patient with significant lower extremity pain leading to difficulty ambulating.  Patient lives on the second level with no elevator at home and will be unsafe to discharge with difficulty ambulating and pain uncontrolled. -CT pelvis done with new interval superior endplate compression fracture deformity at L4 with 20% loss of height centrally, no retropulsion, this reflects pathologic fracture.  Additional multifocal lytic/sclerotic osseous  metastases in the visualized axial and appendicular skeleton, unchanged. -Patient was maintained on scheduled IV Dilaudid every 3 hours with some complaints of breakthrough pain.   -Oxycodone was resumed as needed for breakthrough pain. -Scheduled ibuprofen increased to 800 mg 3 times daily per palliative care. -Patient also placed on scheduled Neurontin and calcitonin nasal spray. -IR reviewed films and concerned about pathologic femur fractures and recommended MRI of the pelvis with evaluation by orthopedics. -MRI pelvis and MRI right femur done with extensive osseous metastatic disease involving lower lumbar vertebral bodies, sacrum, ilium, bilateral acetabulum, bilateral femurs.  Abnormal bone lesion encompassing nearly entirety of right femoral head and femoral neck with severe cortical destruction of anterior femoral head neck junction placing patient high risk for pathologic fracture.  No acute fracture identified on MRI, cortical irregularity on recent CT pelvis performed concerning for nondisplaced pathologic fracture. -MRI with abnormal bone lesion in anterior left femoral neck with focal severe cortical destruction placing patient high risk for pathologic fracture.  Abnormal bone lesion encompassing nearly entirety of left side of sacrum and ilium with possibly nondisplaced pathologic fracture of the left sacral alla.  Large right hip joint effusion.  Mild edema in the left gluteus minimus muscle likely reflecting mild muscle strain. -Patient placed on bedrest. -Patient reassessed by Dr.Xu, orthopedics on 01/17/2021 who reviewed MRI findings and based on MRI findings feels patient is at high risk for impending pathologic fracture that would require proximal femur replacement which requires expertise of orthopedic oncologist at the tertiary care center such as [facility].   -Dr Erlinda Hong, orthopedics spoke with one of his colleagues Dr. Magda Bernheim at [facility] for his  expertise in this matter and he  graciously agreed to see patient in consultation once patient arrived at their center. -[facility] as well as [facility] and was told no beds available as both hospitals at full capacity early on. -Called and spoke with [facility] however there were also at full capacity but place patient on wait list. -Patient with complaints of left shoulder pain, plain films of the left shoulder negative for any acute abnormalities. -Patient maintained on pain regimen of scheduled IV Dilaudid, scheduled ibuprofen, Neurontin, oxycodone as needed breakthrough pain, trazodone as needed sleep.   -Patient was followed by palliative care, oncology, IR, orthopedics during the hospitalization.   -[facility] was called again by Dr. Johnnye Sima, and patient was accepted for transfer to [facility] for further evaluation and management by oncology orthopedics.  Patient was accepted by Dr. Janice Coffin to the orthopedic service.  2.  Metastatic breast cancer -2D echo with normal EF,NWMA -Patient received Xeloda and completed course on 01/21/2021. -Patient received a dose of Herceptin on 01/20/2021 per oncology.  -Patient to be transferred to tertiary care center at [facility]. -Follow-up with oncology after discharge from tertiary care center.  3.  Anemia of chronic disease -Likely anemia of chronic disease. -Patient with no overt bleeding. -Anemia panel consistent with anemia of chronic disease.   -Hemoglobin stabilized at 9.4.   -Patient will be transferred to tertiary care center.   4.  Hypercalcemia -Likely hypercalcemia of malignancy. -Patient with metastatic breast cancer. -Magnesium repleted.  Phosphorus at 2.5. -Status post IV pamidronate x1 (01/16/2021 ). -Corrected calcium at 8.72.   -Calcitonin nasal spray discontinued per  oncology.   -IV fluids saline lock.   -Patient to be transferred to tertiary care center.  5.  Hypokalemia/hypomagnesemia -Repleted.   -On day of discharge magnesium was 1.9, potassium at 3.8.  -Patient will be transferred to tertiary care center at [facility].       Procedures: CT abdomen and pelvis 01/14/2021 2D echo 01/14/2021 MRI right femur/MRI pelvis 01/16/2021 Plain films right femur 01/16/2021 Plain films of the left shoulder 01/19/2021    Consultations: Oncology: Dr. Jana Hakim. IR Orthopedics: Dr. Erlinda Hong 01/16/2021 Palliative care: Dr. Hilma Favors  Discharge Exam: Vitals:   01/21/21 0539 01/21/21 1349  BP: 129/88 116/68  Pulse: 79 88  Resp: 16 17  Temp: 98.3 F (36.8 C) 98.9 F (37.2 C)  SpO2: 99% 97%    General: NAD Cardiovascular: RRR no murmurs rubs or gallops.  No JVD.  No lower extremity edema Respiratory: CTA B.  No wheezes, no crackles, no rhonchi.  Discharge Instructions   Discharge Instructions     Diet general   Complete by: As directed    Increase activity slowly   Complete by: As directed       Allergies as of 01/21/2021   No Known Allergies      Medication List     STOP taking these medications    capecitabine 500 MG tablet Commonly known as: XELODA   dexamethasone 4 MG tablet Commonly known as: DECADRON   fluconazole 100 MG tablet Commonly known as: DIFLUCAN   naproxen sodium 220 MG tablet Commonly known as: ALEVE   traMADol 50 MG tablet Commonly known as: ULTRAM   tucatinib 150 MG tablet Commonly known as: TUKYSA       TAKE these medications    acetaminophen 325  MG tablet Commonly known as: TYLENOL Take 2 tablets (650 mg total) by mouth every 6 (six) hours as needed for mild pain (or Fever >/= 101). What changed:  medication strength how much to take when to take this reasons to take this   b complex vitamins capsule Take 1 capsule by mouth in the morning and at bedtime.   BIOTIN PO Take 1 capsule by mouth  in the morning and at bedtime.   cholecalciferol 25 MCG (1000 UNIT) tablet Commonly known as: VITAMIN D3 Take 1 tablet (1,000 Units total) by mouth daily.   doxycycline 100 MG tablet Commonly known as: VIBRA-TABS Take 1 tablet (100 mg total) by mouth daily for 5 days. Start taking on: January 22, 2021   enoxaparin 40 MG/0.4ML injection Commonly known as: LOVENOX Inject 0.4 mLs (40 mg total) into the skin daily.   ferrous sulfate 325 (65 FE) MG tablet Take 325 mg by mouth daily with breakfast.   gabapentin 300 MG capsule Commonly known as: NEURONTIN Take 1 capsule (300 mg total) by mouth 3 (three) times daily.   HYDROmorphone 1 MG/ML injection Commonly known as: DILAUDID Inject 1 mL (1 mg total) into the vein every 3 (three) hours.   ibuprofen 800 MG tablet Commonly known as: ADVIL Take 1 tablet (800 mg total) by mouth 3 (three) times daily.   lidocaine 5 % Commonly known as: LIDODERM Place 1 patch onto the skin daily as needed (for pain- Remove & Discard patch within 12 hours or as directed by MD).   omeprazole 40 MG capsule Commonly known as: PRILOSEC Take 1 capsule (40 mg total) by mouth daily before supper.   ondansetron 4 MG disintegrating tablet Commonly known as: Zofran ODT Take 1 tablet (4 mg total) by mouth See admin instructions. Dissolve 4 mg in the mouth three times a day and an additional 4 mg at bedtime as needed for nausea/vomiting   oxyCODONE 5 MG immediate release tablet Commonly known as: Oxy IR/ROXICODONE Take 1-2 tablets (5-10 mg total) by mouth every 4 (four) hours as needed for breakthrough pain.   phosphorus 155-852-130 MG tablet Commonly known as: K PHOS NEUTRAL Take 1 tablet (250 mg total) by mouth 2 (two) times daily for 2 days.   polyethylene glycol 17 g packet Commonly known as: MIRALAX / GLYCOLAX Take 17 g by mouth daily as needed for moderate constipation.   prochlorperazine 10 MG tablet Commonly known as: COMPAZINE Take 1 tablet (10  mg total) by mouth every 6 (six) hours.   promethazine 25 MG suppository Commonly known as: PHENERGAN Place 1 suppository (25 mg total) rectally every 6 (six) hours as needed for nausea or vomiting.   senna-docusate 8.6-50 MG tablet Commonly known as: Senokot-S Take 2 tablets by mouth 2 (two) times daily.   traZODone 50 MG tablet Commonly known as: DESYREL Take 1 tablet (50 mg total) by mouth at bedtime as needed for sleep.       No Known Allergies  Follow-up Information     MD AT [facility] Follow up.                   The results of significant diagnostics from this hospitalization (including imaging, microbiology, ancillary and laboratory) are listed below for reference.    Significant Diagnostic Studies: CT PELVIS WO CONTRAST  Addendum Date: 01/16/2021   ADDENDUM REPORT: 01/16/2021 12:44 ADDENDUM: Upon further review, there has been interval development of significant metastatic disease to the bilateral proximal femurs  compared to the relatively recent prior study dated 11/30/2020. On the right, several cortical lucencies are visible in the region of the subcapital and mid femoral neck highly concerning for pathologic fracture. Metastatic disease also extends into the femoral head itself. On the left, and anteriorly located lytic lesion appears to result in cortical breakthrough along the anterior surface of the proximal femoral neck (see sagittal images 153 in 154 of series 8). These findings may represent the primary source of the patient's progressive bilateral hip pain. These results will be called to the ordering clinician or representative by the Radiologist Assistant, and communication documented in the PACS or [employer]. Electronically Signed   By: Jacqulynn Cadet M.D.   On: 01/16/2021 12:44   Result Date: 01/16/2021 CLINICAL DATA:  Breast cancer, pelvic pain, known osseous metastases EXAM: CT PELVIS WITHOUT CONTRAST TECHNIQUE: Multidetector CT  imaging of the pelvis was performed following the standard protocol without intravenous contrast. COMPARISON:  CT abdomen/pelvis dated 11/30/2020 FINDINGS: Urinary Tract:  Bladder is within normal limits. Bowel:  Visualized bowel is unremarkable. Vascular/Lymphatic: No evidence of aneurysm. No suspicious pelvic lymphadenopathy. Reproductive:  Status post hysterectomy. Bilateral ovaries are within normal limits. Other:  No pelvic ascites. Subcutaneous injection sites along the left lower abdominal wall (series 6/image 68). Musculoskeletal: Interval superior endplate compression fracture deformity at L4 (sagittal image 102), with 20% loss of height centrally, but no retropulsion. This reflects a pathologic fracture with central lytic lesion. Otherwise, this appearance is overall unchanged. Multifocal lytic/sclerotic metastases in the lower lumbar spine (series 3/image 11), sacrum series 3/image 35), left iliac bone (series 3/image 43), right femoral head (series 3/image 91), left femoral head/neck (series 3/image 101), and bilateral inferior pubic rami/parasymphyseal regions (series 3/image 106). IMPRESSION: Interval superior endplate compression fracture deformity at L4, with 20% loss of height centrally, but no retropulsion. This reflects a pathologic fracture. Additional multifocal lytic/sclerotic osseous metastases in the visualized axial and appendicular skeleton, unchanged. Electronically Signed: By: Julian Hy M.D. On: 01/14/2021 19:59   MR PELVIS W WO CONTRAST  Result Date: 01/17/2021 CLINICAL DATA:  Metastatic disease, evaluate for femur fracture EXAM: MRI PELVIS WITHOUT AND WITH CONTRAST MRI FEMUR WITHOUT AND WITH CONTRAST TECHNIQUE: Multiplanar multisequence MR imaging of the pelvis was performed both before and after administration of intravenous contrast. Multiplanar multisequence MR imaging of the femur was performed both before and after administration of intravenous contrast. CONTRAST:  89mL  GADAVIST GADOBUTROL 1 MMOL/ML IV SOLN COMPARISON:  CT pelvis 01/14/2021 FINDINGS: Bones: Numerous abnormal enhancing bone lesions in the lower lumbar vertebral bodies, sacrum, ilium, bilateral acetabulum, and bilateral proximal femurs. Abnormal bone lesion encompasses nearly the entirety of the left side of the sacrum and ilium with possible nondisplaced pathologic fracture of the left sacral ala. Abnormal bone lesion in the anterior left femoral neck with focal severe cortical destruction placing the patient at high risk for a pathologic fracture. Abnormal bone lesion encompasses nearly the entirety of the right femoral head and femoral neck with severe cortical destruction of the anterior femoral head-neck junction placing the patient at high risk for a pathologic fracture. Although no acute fracture is identified on this MRI, there is cortical irregularity along the recent CT of the pelvis performed on 01/14/2021 concerning for a nondisplaced pathologic fracture. Abnormal bone lesions throughout the inferior pubic rami bilaterally. Pathologic nondisplaced fracture of the left inferior pubic ramus. Abnormal bone lesions in the right distal femoral diaphysis and metaphysis with cortical destruction of the posterolateral margin. At small  abnormal bone lesion in the mid left femoral diaphysis. Abnormal bone lesions in the left distal femoral metaphysis. No other acute fracture or dislocation. Normal sacrum and sacroiliac joints. No SI joint widening or erosive changes. Articular cartilage and labrum Articular cartilage:  No chondral defect. Labrum: Grossly intact, but evaluation is limited by lack of intraarticular fluid. Joint or bursal effusion Joint effusion: Large right hip joint effusion. No left hip joint effusion. No right SI joint effusion. Small left SI joint effusion. Bursae: No bursa formation. Muscles and tendons Flexors: Normal. Extensors: Normal. Abductors: Normal. Adductors: Normal. Gluteals: Mild  edema in the left gluteus minimus muscle likely reflecting mild muscle strain. Hamstrings: Normal. Other findings No pelvic free fluid. No fluid collection or hematoma. No inguinal lymphadenopathy. No inguinal hernia. IMPRESSION: 1. Extensive osseous metastatic disease likely secondary to breast cancer involving the lower lumbar vertebral bodies, sacrum, ilium, bilateral acetabulum, and bilateral femurs as described above. 2. Abnormal bone lesion encompasses nearly the entirety of the right femoral head and femoral neck with severe cortical destruction of the anterior femoral head-neck junction placing the patient at high risk for a pathologic fracture. Although no acute fracture is identified on this MRI, there is cortical irregularity on the recent CT of the pelvis performed on 01/14/2021 concerning for a nondisplaced pathologic fracture. 3. Pathologic nondisplaced fracture of the left inferior pubic ramus. 4. Abnormal bone lesion in the anterior left femoral neck with focal severe cortical destruction placing the patient at high risk for a pathologic fracture. 5. Abnormal bone lesion encompasses nearly the entirety of the left side of the sacrum and ilium with possible nondisplaced pathologic fracture of the left sacral ala. 6. Large right hip joint effusion. 7. Mild edema in the left gluteus minimus muscle likely reflecting mild muscle strain. Electronically Signed   By: Kathreen Devoid   On: 01/17/2021 07:34   MR FEMUR RIGHT W WO CONTRAST  Result Date: 01/17/2021 CLINICAL DATA:  Metastatic disease, evaluate for femur fracture EXAM: MRI PELVIS WITHOUT AND WITH CONTRAST MRI FEMUR WITHOUT AND WITH CONTRAST TECHNIQUE: Multiplanar multisequence MR imaging of the pelvis was performed both before and after administration of intravenous contrast. Multiplanar multisequence MR imaging of the femur was performed both before and after administration of intravenous contrast. CONTRAST:  29mL GADAVIST GADOBUTROL 1 MMOL/ML IV  SOLN COMPARISON:  CT pelvis 01/14/2021 FINDINGS: Bones: Numerous abnormal enhancing bone lesions in the lower lumbar vertebral bodies, sacrum, ilium, bilateral acetabulum, and bilateral proximal femurs. Abnormal bone lesion encompasses nearly the entirety of the left side of the sacrum and ilium with possible nondisplaced pathologic fracture of the left sacral ala. Abnormal bone lesion in the anterior left femoral neck with focal severe cortical destruction placing the patient at high risk for a pathologic fracture. Abnormal bone lesion encompasses nearly the entirety of the right femoral head and femoral neck with severe cortical destruction of the anterior femoral head-neck junction placing the patient at high risk for a pathologic fracture. Although no acute fracture is identified on this MRI, there is cortical irregularity along the recent CT of the pelvis performed on 01/14/2021 concerning for a nondisplaced pathologic fracture. Abnormal bone lesions throughout the inferior pubic rami bilaterally. Pathologic nondisplaced fracture of the left inferior pubic ramus. Abnormal bone lesions in the right distal femoral diaphysis and metaphysis with cortical destruction of the posterolateral margin. At small abnormal bone lesion in the mid left femoral diaphysis. Abnormal bone lesions in the left distal femoral metaphysis. No other acute fracture or  dislocation. Normal sacrum and sacroiliac joints. No SI joint widening or erosive changes. Articular cartilage and labrum Articular cartilage:  No chondral defect. Labrum: Grossly intact, but evaluation is limited by lack of intraarticular fluid. Joint or bursal effusion Joint effusion: Large right hip joint effusion. No left hip joint effusion. No right SI joint effusion. Small left SI joint effusion. Bursae: No bursa formation. Muscles and tendons Flexors: Normal. Extensors: Normal. Abductors: Normal. Adductors: Normal. Gluteals: Mild edema in the left gluteus minimus  muscle likely reflecting mild muscle strain. Hamstrings: Normal. Other findings No pelvic free fluid. No fluid collection or hematoma. No inguinal lymphadenopathy. No inguinal hernia. IMPRESSION: 1. Extensive osseous metastatic disease likely secondary to breast cancer involving the lower lumbar vertebral bodies, sacrum, ilium, bilateral acetabulum, and bilateral femurs as described above. 2. Abnormal bone lesion encompasses nearly the entirety of the right femoral head and femoral neck with severe cortical destruction of the anterior femoral head-neck junction placing the patient at high risk for a pathologic fracture. Although no acute fracture is identified on this MRI, there is cortical irregularity on the recent CT of the pelvis performed on 01/14/2021 concerning for a nondisplaced pathologic fracture. 3. Pathologic nondisplaced fracture of the left inferior pubic ramus. 4. Abnormal bone lesion in the anterior left femoral neck with focal severe cortical destruction placing the patient at high risk for a pathologic fracture. 5. Abnormal bone lesion encompasses nearly the entirety of the left side of the sacrum and ilium with possible nondisplaced pathologic fracture of the left sacral ala. 6. Large right hip joint effusion. 7. Mild edema in the left gluteus minimus muscle likely reflecting mild muscle strain. Electronically Signed   By: Kathreen Devoid   On: 01/17/2021 07:34   DG Chest Portable 1 View  Result Date: 12/28/2020 CLINICAL DATA:  Chest pain. Additional history provided: Patient reports chest pain in middle of chest. Nausea/vomiting since yesterday. History of breast cancer. EXAM: PORTABLE CHEST 1 VIEW COMPARISON:  Prior chest radiographs 11/15/2020 and earlier. CT chest 11/30/2020. FINDINGS: Heart size within normal limits. Multiple known bilateral pulmonary nodules, better appreciated on the prior chest CT of 11/30/2020. Ill-defined opacity within the right mid lung field, likely corresponds with  a known dominant 2.2 cm right middle lobe nodule. No appreciable superimposed pneumonia or pulmonary edema. No evidence of pleural effusion or pneumothorax. Known widespread osseous metastatic disease. T9, T11, T12 and L2 pathologic compression fractures were better appreciated on the prior CT. IMPRESSION: Multiple known bilateral pulmonary nodules, better appreciated on the prior chest CT of 11/30/2020. Ill-defined opacity within the right mid lung, likely corresponds with the known dominant 2.2 cm right middle lobe nodule. No appreciable superimposed pneumonia or pulmonary edema. Known widespread osseous metastatic disease. T9, T11, T12 and L2 pathologic compression fractures were better appreciated on the prior CT. Electronically Signed   By: Kellie Simmering DO   On: 12/28/2020 11:51   DG Shoulder Left  Result Date: 01/19/2021 CLINICAL DATA:  Shoulder pain EXAM: LEFT SHOULDER - 2+ VIEW COMPARISON:  Chest CT Nov 30, 2020 and radiograph December 28, 2020. FINDINGS: There is no evidence of fracture or dislocation. There is no evidence of arthropathy or other focal bone abnormality. Soft tissues are unremarkable. IMPRESSION: No acute osseous abnormality. Electronically Signed   By: Dahlia Bailiff MD   On: 01/19/2021 15:59   DG Radiologist Eval And Mgmt  Result Date: 01/14/2021 EXAM: NEW PATIENT OFFICE VISIT CHIEF COMPLAINT: See dictated note in epic HISTORY OF PRESENT ILLNESS: See  dictated note in epic REVIEW OF SYSTEMS: See dictated note in epic PHYSICAL EXAMINATION: See dictated note in epic ASSESSMENT AND PLAN: See dictated note in epic Electronically Signed   By: Jacqulynn Cadet M.D.   On: 01/14/2021 08:36   ECHOCARDIOGRAM COMPLETE  Result Date: 01/14/2021    ECHOCARDIOGRAM REPORT   Patient Name:   [facility] Date of Exam: 01/14/2021 Medical Rec #:  [id number]     Height:       66.0 in Accession #:    [id number]    Weight:       171.0 lb Date of Birth:  1971/07/04     BSA:          1.871 m Patient Age:    2  years      BP:           113/72 mmHg Patient Gender: F             HR:           94 bpm. Exam Location:  Outpatient Procedure: 2D Echo, Cardiac Doppler, Color Doppler and Strain Analysis Indications:    Chemo evaluation  History:        Patient has prior history of Echocardiogram examinations, most                 recent 06/14/2018. Signs/Symptoms:Breast cancer.  Sonographer:    Dustin Flock Referring Phys: [place]  1. Left ventricular ejection fraction, by estimation, is 60 to 65%. The left ventricle has normal function. The left ventricle has no regional wall motion abnormalities. Left ventricular diastolic parameters were normal.  2. Right ventricular systolic function is normal. The right ventricular size is normal. Tricuspid regurgitation signal is inadequate for assessing PA pressure.  3. The mitral valve is grossly normal. No evidence of mitral valve regurgitation. No evidence of mitral stenosis.  4. The aortic valve is tricuspid. Aortic valve regurgitation is not visualized. No aortic stenosis is present.  5. The inferior vena cava is normal in size with greater than 50% respiratory variability, suggesting right atrial pressure of 3 mmHg. Conclusion(s)/Recommendation(s): Normal biventricular function without evidence of hemodynamically significant valvular heart disease. FINDINGS  Left Ventricle: Left ventricular ejection fraction, by estimation, is 60 to 65%. The left ventricle has normal function. The left ventricle has no regional wall motion abnormalities. Global longitudinal strain performed but not reported based on interpreter judgement due to suboptimal tracking. The left ventricular internal cavity size was normal in size. There is no left ventricular hypertrophy. Left ventricular diastolic parameters were normal. Right Ventricle: The right ventricular size is normal. No increase in right ventricular wall thickness. Right ventricular systolic function is normal.  Tricuspid regurgitation signal is inadequate for assessing PA pressure. Left Atrium: Left atrial size was normal in size. Right Atrium: Right atrial size was normal in size. Pericardium: Trivial pericardial effusion is present. Presence of pericardial fat pad. Mitral Valve: The mitral valve is grossly normal. No evidence of mitral valve regurgitation. No evidence of mitral valve stenosis. Tricuspid Valve: The tricuspid valve is grossly normal. Tricuspid valve regurgitation is trivial. No evidence of tricuspid stenosis. Aortic Valve: The aortic valve is tricuspid. Aortic valve regurgitation is not visualized. No aortic stenosis is present. Pulmonic Valve: The pulmonic valve was grossly normal. Pulmonic valve regurgitation is not visualized. No evidence of pulmonic stenosis. Aorta: The aortic root and ascending aorta are structurally normal, with no evidence of dilitation. Venous: The right lower pulmonary vein is normal.  The inferior vena cava is normal in size with greater than 50% respiratory variability, suggesting right atrial pressure of 3 mmHg. IAS/Shunts: The atrial septum is grossly normal.  LEFT VENTRICLE PLAX 2D LVIDd:         4.00 cm     Diastology LVIDs:         2.50 cm     LV e' medial:    5.00 cm/s LV PW:         0.90 cm     LV E/e' medial:  15.9 LV IVS:        1.10 cm     LV e' lateral:   6.64 cm/s LVOT diam:     2.00 cm     LV E/e' lateral: 12.0 LV SV:         65 LV SV Index:   35 LVOT Area:     3.14 cm  LV Volumes (MOD) LV vol d, MOD A4C: 72.7 ml LV vol s, MOD A4C: 29.8 ml LV SV MOD A4C:     72.7 ml RIGHT VENTRICLE RV Basal diam:  2.60 cm RV S prime:     14.60 cm/s TAPSE (M-mode): 2.2 cm LEFT ATRIUM             Index       RIGHT ATRIUM           Index LA diam:        2.40 cm 1.28 cm/m  RA Area:     11.40 cm LA Vol (A2C):   27.8 ml 14.86 ml/m RA Volume:   23.30 ml  12.45 ml/m LA Vol (A4C):   30.2 ml 16.14 ml/m LA Biplane Vol: 29.2 ml 15.61 ml/m  AORTIC VALVE LVOT Vmax:   110.00 cm/s LVOT Vmean:   72.300 cm/s LVOT VTI:    0.208 m  AORTA Ao Root diam: 2.80 cm MITRAL VALVE MV Area (PHT): 3.68 cm    SHUNTS MV Decel Time: 206 msec    Systemic VTI:  0.21 m MV E velocity: 79.50 cm/s  Systemic Diam: 2.00 cm MV A velocity: 69.40 cm/s MV E/A ratio:  1.15 Eleonore Chiquito MD Electronically signed by Eleonore Chiquito MD Signature Date/Time: 01/14/2021/7:04:07 PM    Final    DG FEMUR PORT, MIN 2 VIEWS RIGHT  Result Date: 01/16/2021 CLINICAL DATA:  Right femur pain history of cancer with Mets EXAM: RIGHT FEMUR PORTABLE 2 VIEW COMPARISON:  CT 01/14/2021 FINDINGS: Focal permeative appearance of the distal femoral shaft, raises concern for metastatic disease. Lucent lesion seen on recent pelvic CT within the proximal femur is better seen on CT. No malalignment. Suspected right femoral neck fracture is also better seen on CT. IMPRESSION: 1. CT demonstrated metastatic lucent lesions on recent pelvic CT at the proximal right femur better demonstrated on CT. Possible subtle step-off at the femoral neck, may correspond to suspected femoral neck fracture on CT. 2. Focal permeative appearance of the distal shaft of the right femur concerning for metastatic focus. Electronically Signed   By: Donavan Foil M.D.   On: 01/16/2021 18:23    Microbiology: Recent Results (from the past 240 hour(s))  SARS CORONAVIRUS 2 (TAT 6-24 HRS) Nasopharyngeal Nasopharyngeal Swab     Status: None   Collection Time: 01/14/21  4:43 PM   Specimen: Nasopharyngeal Swab  Result Value Ref Range Status   SARS Coronavirus 2 NEGATIVE NEGATIVE Final    Comment: (NOTE) SARS-CoV-2 target nucleic acids are NOT DETECTED.  The SARS-CoV-2 RNA is  generally detectable in upper and lower respiratory specimens during the acute phase of infection. Negative results do not preclude SARS-CoV-2 infection, do not rule out co-infections with other pathogens, and should not be used as the sole basis for treatment or other patient management decisions. Negative  results must be combined with clinical observations, patient history, and epidemiological information. The expected result is Negative.  Fact Sheet for Patients: SugarRoll.be  Fact Sheet for Healthcare Providers: https://www.woods-mathews.com/  This test is not yet approved or cleared by the [place] FDA and  has been authorized for detection and/or diagnosis of SARS-CoV-2 by FDA under an Emergency Use Authorization (EUA). This EUA will remain  in effect (meaning this test can be used) for the duration of the COVID-19 declaration under Se ction 564(b)(1) of the Act, 21 U.S.C. section 360bbb-3(b)(1), unless the authorization is terminated or revoked sooner.  Performed at [facility], [place] [address] [place], [place] [address]      Labs: Basic Metabolic Panel: Recent Labs  Lab 01/16/21 0516 01/17/21 1014 01/18/21 0554 01/19/21 0605 01/20/21 0507 01/21/21 0504  NA 139 135 137 140 138 140  K 4.0 3.4* 3.4* 3.6 3.6 3.8  CL 98 101 105 108 107 109  CO2 30 24 25 26 25 26   GLUCOSE 107* 166* 95 96 88 93  BUN 15 13 10 8 8 9   CREATININE 0.85 0.76 0.74 0.66 0.53 0.63  CALCIUM 11.1* 10.9* 8.8* 8.2* 7.6* 8.1*  MG 1.8 1.7 1.6* 2.1 1.8 1.9  PHOS 4.5 3.5  --  2.3* 2.5 2.9   Liver Function Tests: Recent Labs  Lab 01/17/21 1014 01/19/21 0605 01/20/21 0507 01/21/21 0504  ALBUMIN 3.2* 2.9* 2.6* 2.7*   No results for input(s): LIPASE, AMYLASE in the last 168 hours. No results for input(s): AMMONIA in the last 168 hours. CBC: Recent Labs  Lab 01/16/21 0516 01/17/21 1014 01/18/21 0554 01/19/21 0605 01/20/21 0507 01/21/21 0504  WBC 7.7 7.0 4.3 3.1* 4.2 5.0  NEUTROABS 5.5 5.6 3.5  --  2.9  --   HGB 12.2 11.4* 9.7* 10.4* 9.4* 9.4*  HCT 38.9 35.7* 30.2* 33.4* 29.4* 29.5*  MCV 95.1 95.5 94.1 95.2 94.5 95.2  PLT 278 234 156 166 143* 143*   Cardiac Enzymes: No results for input(s): CKTOTAL, CKMB, CKMBINDEX, TROPONINI in the  last 168 hours. BNP: BNP (last 3 results) No results for input(s): BNP in the last 8760 hours.  ProBNP (last 3 results) No results for input(s): PROBNP in the last 8760 hours.  CBG: No results for input(s): GLUCAP in the last 168 hours.     Signed:  Irine Seal MD.  [facility] 01/21/2021, 4:55 PM

## 2021-01-21 NOTE — Progress Notes (Signed)
Monica [name]   DOB:Oct 22, 1970   BX#:[id number]   NVB#:[id number]  Subjective:  Niva tolerated Herceptin yesterday AM w/o complications; somnolent due to benadryl premeds; had normal BM; pain relatively well controlled but confined to bed and minimal motion; comp[letes 14 days of capecitabine today; no family in room  Objective: White woman examined in bed Vitals:   01/20/21 2033 01/21/21 0539  BP: 119/78 129/88  Pulse: 88 79  Resp: 16 16  Temp: 98.6 F (37 C) 98.3 F (36.8 C)  SpO2: 98% 99%    Body mass index is 27.75 kg/m.  Intake/Output Summary (Last 24 hours) at 01/21/2021 0746 Last data filed at 01/21/2021 0624 Gross per 24 hour  Intake 720 ml  Output --  Net 720 ml     CBG (last 3)  No results for input(s): GLUCAP in the last 72 hours.   Labs:  Lab Results  Component Value Date   WBC 5.0 01/21/2021   HGB 9.4 (L) 01/21/2021   HCT 29.5 (L) 01/21/2021   MCV 95.2 01/21/2021   PLT 143 (L) 01/21/2021   NEUTROABS 2.9 01/20/2021    _0 @  Urine Studies No results for input(s): UHGB, CRYS in the last 72 hours.  Invalid input(s): UACOL, UAPR, USPG, UPH, UTP, UGL, [place], UBIL, UNIT, UROB, [place], UEPI, UWBC, Duwayne Heck [place], [place]  Basic Metabolic Panel: Recent Labs  Lab 01/16/21 0516 01/17/21 1014 01/18/21 0554 01/19/21 0605 01/20/21 0507 01/21/21 0504  NA 139 135 137 140 138 140  K 4.0 3.4* 3.4* 3.6 3.6 3.8  CL 98 101 105 108 107 109  CO2 _1 GLUCOSE 107* 166* 95 96 88 93  BUN _2 CREATININE 0.85 0.76 0.74 0.66 0.53 0.63  CALCIUM 11.1* 10.9* 8.8* 8.2* 7.6* 8.1*  MG 1.8 1.7 1.6* 2.1 1.8 1.9  PHOS 4.5 3.5  --  2.3* 2.5 2.9   GFR Estimated Creatinine Clearance: 88.7 mL/min (by C-G formula based on SCr of 0.63 mg/dL). Liver Function Tests: Recent Labs  Lab 01/14/21 1255 01/17/21 1014 01/19/21 0605 01/20/21 0507 01/21/21 0504  AST 82*  --   --   --   --   ALT 73*  --   --   --   --   ALKPHOS 166*  --   --    --   --   BILITOT 0.8  --   --   --   --   PROT 7.3  --   --   --   --   ALBUMIN 3.8 3.2* 2.9* 2.6* 2.7*   No results for input(s): LIPASE, AMYLASE in the last 168 hours. No results for input(s): AMMONIA in the last 168 hours. Coagulation profile No results for input(s): INR, PROTIME in the last 168 hours.  CBC: Recent Labs  Lab 01/14/21 1255 01/16/21 0516 01/17/21 1014 01/18/21 0554 01/19/21 0605 01/20/21 0507 01/21/21 0504  WBC 10.6* 7.7 7.0 4.3 3.1* 4.2 5.0  NEUTROABS 8.3* 5.5 5.6 3.5  --  2.9  --   HGB 11.9* 12.2 11.4* 9.7* 10.4* 9.4* 9.4*  HCT 37.3 38.9 35.7* 30.2* 33.4* 29.4* 29.5*  MCV 94.4 95.1 95.5 94.1 95.2 94.5 95.2  PLT 270 278 234 156 166 143* 143*   Cardiac Enzymes: No results for input(s): CKTOTAL, CKMB, CKMBINDEX, TROPONINI in the last 168 hours. BNP: Invalid input(s): POCBNP CBG: No results for input(s): GLUCAP in the last 168 hours. D-Dimer No results for input(s): DDIMER  in the last 72 hours. Hgb A1c No results for input(s): HGBA1C in the last 72 hours. Lipid Profile No results for input(s): CHOL, HDL, LDLCALC, TRIG, CHOLHDL, LDLDIRECT in the last 72 hours. Thyroid function studies No results for input(s): TSH, T4TOTAL, T3FREE, THYROIDAB in the last 72 hours.  Invalid input(s): FREET3 Anemia work up No results for input(s): VITAMINB12, FOLATE, FERRITIN, TIBC, IRON, RETICCTPCT in the last 72 hours.  Microbiology Recent Results (from the past 240 hour(s))  SARS CORONAVIRUS 2 (TAT 6-24 HRS) Nasopharyngeal Nasopharyngeal Swab     Status: None   Collection Time: 01/14/21  4:43 PM   Specimen: Nasopharyngeal Swab  Result Value Ref Range Status   SARS Coronavirus 2 NEGATIVE NEGATIVE Final    Comment: (NOTE) SARS-CoV-2 target nucleic acids are NOT DETECTED.  The SARS-CoV-2 RNA is generally detectable in upper and lower respiratory specimens during the acute phase of infection. Negative results do not preclude SARS-CoV-2 infection, do not rule  out co-infections with other pathogens, and should not be used as the sole basis for treatment or other patient management decisions. Negative results must be combined with clinical observations, patient history, and epidemiological information. The expected result is Negative.  Fact Sheet for Patients: SugarRoll.be  Fact Sheet for Healthcare Providers: https://www.woods-mathews.com/  This test is not yet approved or cleared by the [place] FDA and  has been authorized for detection and/or diagnosis of SARS-CoV-2 by FDA under an Emergency Use Authorization (EUA). This EUA will remain  in effect (meaning this test can be used) for the duration of the COVID-19 declaration under Se ction 564(b)(1) of the Act, 21 U.S.C. section 360bbb-3(b)(1), unless the authorization is terminated or revoked sooner.  Performed at [facility], [place] [address], [place], [place] [address]       Studies:  DG Shoulder Left  Result Date: 01/19/2021 CLINICAL DATA:  Shoulder pain EXAM: LEFT SHOULDER - 2+ VIEW COMPARISON:  Chest CT Nov 30, 2020 and radiograph December 28, 2020. FINDINGS: There is no evidence of fracture or dislocation. There is no evidence of arthropathy or other focal bone abnormality. Soft tissues are unremarkable. IMPRESSION: No acute osseous abnormality. Electronically Signed   By: Dahlia Bailiff MD   On: 01/19/2021 15:59    Assessment: 50 y.o.[place], [place] woman status post right breast upper inner quadrant biopsy 12/29/2017, for a clinical T1c N0, stage IA invasive ductal carcinoma, triple positive, with an MIB-1 of 80%.   (1) genetics testing 02/02/2018 though the CancerNext gene panel offered by [facility] showed no deleterious mutations in  APC, ATM, BARD1, BMPR1A,BRCA1, BRCA2, BRIP1, CDH1, CDK4, CDKN2A, CHEK2, DICER1, HOXB13, MLH1, MRE11A, MSH2, MSH6, MUTYH, NBN, NF1, PALB2, PMS2, POLD1, POLE, PTEN, RAD50, RAD51C, RAD51D, SMAD4,  SMARCA4, STK11 and TP53 (sequencing and deletion/duplication); EPCAM and GREM1 (deletion/duplication only).             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               (a) Pertuzumab omitted after cycle 1  due to diarrhea.             (b) Gemcitabine substituted for docetaxel starting with cycle 5 due to peripheral neuropathy   (4) continued trastuzumab to total 6 months (through February 2020).             (a) echocardiogram 01/21/2018 showed an ejection fraction in the 55-60% range             (b) repeat echocardiogram 06/14/2018 showed an ejection fraction in the 60-65%   (5) adjuvant radiation 08/15/2018 - 09/28/2018  Site/dose:   The patient initially received a dose of 50.4 Gy in 28 fractions to the right breast using whole-breast tangent fields. This was delivered using a 3-D conformal technique. The patient then received a boost to the seroma. This delivered an additional 10 Gy in 5 fractions using 12E, 9E electrons with a special teletherapy technique. The total dose was 60.4 Gy.    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, involving brain, liver, lungs, and bones (7) presenting 11/29/2020 with progressive back pain:             (a) non-contrast CT abd/pelvis (renal stone study) 11/29/2020 shows bone metastases              (b) CT chest/abd/pelvis with contrast 11/30/2020 shows multiple large liver masses, multiple pulmonary nodules, and widespread lytic bone lesions             (c) MRI brain shows at least 4 brain lesions, largest 3.8 cm             (d) total spinal MRI 11/30/2020 shows pathologic fractures at T9, T12, possibly L2, as well as numerous other spine lesions, but no epidural enhancement or cord compression             (e) liver biopsy 12/02/2020 shows adenocarcinoma, prognostic panel again triple positive             (f) pre-op repeat MRI 12/01/2020 shows additional brain lesions, at least 8 of which will be targets for SRS   (8) CNS treatment:             (a) [place] 12/05/2020             (b) surgery 12/06/2020 confirmed metastatic carcinoma, estrogen receptor positive, HER2 amplified, progesterone receptor negative, with an MIB-1 of 70%.   (9) XRT to spine 12/05/2020 through 12/23/2020 Site Technique Total Dose (Gy) Dose per Fx (Gy) Completed Fx Beam Energies  Thoracic Spine: Spine_T9-L3 Complex 30/30 3 10/10 15X  Pelvis: Pelvis_sacrum Complex 30/30 3 10/10 15X    (10) advanced directives-- plans to name son as HCPOA with her father as secondary   (11) started capecitabine 01/08/2021, started trastuzumab 01/20/2021, tucatinib pending  (A) echo 01/14/2021 shows an excellent EF   (12) to start denosumab/Xgeva August 2022, to be repeated every 6 weeks  (A) received aredia/pamidronate 01/16/2021  (13) awaiting transfer to tertiary care for definitive femur/hip surgery     Plan: Phelicia completes her first cycle of capecitabine today. She did fine with trastuzumab yesterday. I am reluctant to start  tucatinib with her major surgery pending-- prefer to wait until she recovers.  The concern is delay in transfer. We have done all we can for her at this point. She is only going to deteriorate further if we can't get her to definitive surgery.  I am scheduling her to return to my clinic in 3 weeks for her  next Herceptin dose.  Will follow with you.  Chauncey Cruel, MD 01/21/2021  7:46 AM Medical Oncology and Hematology [facility] [address] [place], [place] [address] Tel. [phone]    Fax. [phone]

## 2021-01-21 NOTE — Progress Notes (Signed)
I have discussed her case with Dr. Lita Mains, [facility] to assist in overcoming barriers related to difficulty facilitating transfer to a tertiary center. Will also reach back out to Dr. Erlinda Hong for recommendations on alternative plans for femur stabilization or other ideas to help Monica [name] get the care that she needs so she can receive optimal treatment for her cancer and have the best chance at restoring her QOL. Maintain current pain regimen.  Lane Hacker, DO Palliative Medicine

## 2021-01-22 ENCOUNTER — Ambulatory Visit: Payer: No Typology Code available for payment source | Admitting: Oncology

## 2021-01-22 ENCOUNTER — Telehealth: Payer: Self-pay | Admitting: Oncology

## 2021-01-22 ENCOUNTER — Other Ambulatory Visit: Payer: No Typology Code available for payment source

## 2021-01-22 ENCOUNTER — Ambulatory Visit: Payer: No Typology Code available for payment source

## 2021-01-22 ENCOUNTER — Other Ambulatory Visit: Payer: Self-pay | Admitting: Interventional Radiology

## 2021-01-22 DIAGNOSIS — R7989 Other specified abnormal findings of blood chemistry: Secondary | ICD-10-CM | POA: Insufficient documentation

## 2021-01-22 DIAGNOSIS — E46 Unspecified protein-calorie malnutrition: Secondary | ICD-10-CM | POA: Insufficient documentation

## 2021-01-22 DIAGNOSIS — L708 Other acne: Secondary | ICD-10-CM | POA: Insufficient documentation

## 2021-01-22 DIAGNOSIS — C50211 Malignant neoplasm of upper-inner quadrant of right female breast: Secondary | ICD-10-CM

## 2021-01-22 DIAGNOSIS — C7951 Secondary malignant neoplasm of bone: Secondary | ICD-10-CM

## 2021-01-22 NOTE — Telephone Encounter (Signed)
Scheduled appointment per 07/12 sch msg. Left message 

## 2021-01-23 ENCOUNTER — Encounter: Payer: Self-pay | Admitting: Oncology

## 2021-01-23 ENCOUNTER — Other Ambulatory Visit: Payer: Self-pay | Admitting: Oncology

## 2021-01-26 DIAGNOSIS — I808 Phlebitis and thrombophlebitis of other sites: Secondary | ICD-10-CM | POA: Insufficient documentation

## 2021-01-27 ENCOUNTER — Other Ambulatory Visit: Payer: Self-pay | Admitting: Radiation Oncology

## 2021-01-27 DIAGNOSIS — Z789 Other specified health status: Secondary | ICD-10-CM | POA: Insufficient documentation

## 2021-01-27 DIAGNOSIS — Z7409 Other reduced mobility: Secondary | ICD-10-CM | POA: Insufficient documentation

## 2021-01-27 NOTE — Progress Notes (Signed)
  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  Interval Since Last Radiation:  5 weeks   12/05/2020 through 12/23/2020 Site Technique Total Dose (Gy) Dose per Fx (Gy) Completed Fx Beam Energies  Thoracic Spine: Spine_T9-L3 Complex 30/30 3 10/10 15X  Pelvis: Pelvis_sacrum Complex 30/30 3 10/10 15X    12/05/2020 through 12/05/2020  SRS Treatment:  PTV1_3.8 cm right temporal received 15 Gy in 1 fraction (preoperative SRS) PTV2_2.4 cm right parietal received 18 Gy in 1 fraction (definitive single fraction SRS) The remaining sites PTV3_-PTV9_ each received 20 Gy in 1 fraction (definitive single fraction SRS).   Narrative:  The patient was contacted today for routine follow-up. During treatment she did very well with radiotherapy and did not have significant desquamation. She did well following her craniotomy surgery and was up and walking in for her treatments to the pelvis and spine. She was admitted on 01/14/21 and discharged on 01/21/21 with transfer to [facility] for fixation of her right femur with concerns for impending pathologic fracture. She has since undergone surgical femur partial replacement on 01/22/21 with Dr. Mylo Red.  Impression/Plan: 1. 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 The patient has been doing well since completion of radiotherapy but having other issues related to her cancer. We discussed that we would be happy to continue to follow her as needed regarding her brain, but she will also continue to follow up with Dr. Jana Hakim in medical oncology and  Dr. Mickeal Skinner in neuro oncology. I will also reach out to Dr. [name] to see if he thinks there is any role in treatment of her femur following her recovery.     Carola Rhine, PAC

## 2021-02-03 ENCOUNTER — Other Ambulatory Visit: Payer: Self-pay | Admitting: Oncology

## 2021-02-03 NOTE — Progress Notes (Signed)
Monica [name] was transferred to [facility] for definitive surgery.  She underwent  PR RAD RESEC TUMOR,FEMUR OR KNEE   PR PARTIAL HIP REPLACEMENT   PR ADJ TISS XFER ANY AREA,30.1-60 SQCM    on 01/22/2021 under Dr. Janice Coffin  I phoned Monica [name] today and left her message that she should not start the Xeloda or tucatinib until she sees me 02/10/2021.  At that time we will check her labs to make sure she has recovered sufficiently from surgery and likely start those medications the next day 02/11/2021.

## 2021-02-05 ENCOUNTER — Telehealth: Payer: Self-pay | Admitting: Radiation Oncology

## 2021-02-05 ENCOUNTER — Other Ambulatory Visit: Payer: Self-pay | Admitting: Oncology

## 2021-02-05 NOTE — Telephone Encounter (Signed)
I left a message for the patient to see how she is doing since her surgery. I asked her to call me back.

## 2021-02-06 ENCOUNTER — Other Ambulatory Visit: Payer: No Typology Code available for payment source

## 2021-02-06 ENCOUNTER — Ambulatory Visit: Payer: No Typology Code available for payment source | Admitting: Oncology

## 2021-02-06 ENCOUNTER — Ambulatory Visit: Payer: No Typology Code available for payment source

## 2021-02-09 NOTE — Progress Notes (Signed)
[place]  Telephone:([phone] Fax:([phone]) [phone]     ID: Monica [name] DOB: 1970/12/13  MR#: [id number]  [id number]#:[id number]  Patient Care Team: [name], PA-C as PCP - General (Family Medicine) [name], MD as Consulting Physician (General Surgery) Deamonte Sayegh, [name], MD as Consulting Physician (Oncology) [name], MD as Consulting Physician (Radiation Oncology) [name], MD as Consulting Physician (Obstetrics and Gynecology) Larey Dresser, MD as Consulting Physician (Cardiology) Janice Coffin, MD as Referring Physician (Orthopedic Surgery) OTHER MD: [facility]   CHIEF COMPLAINT: triple positive breast cancer  CURRENT TREATMENT: tucatinib, capecitabine, trastuzumab; denosumab Delton See   INTERVAL HISTORY: Monica [name] returns today for follow up of her triple positive breast cancer accompanied by her brother.  Since her last visit, she was admitted on 01/14/2021 for right hip pain.  CT of the pelvis showed significant disease progression in both proximal femurs on the right specifically with concern for pathologic fracture.  MRI of the pelvis the next day showed extensive bony metastatic disease involving the lower lumbar vertebrae of the sacrum and ilium in the acetabulum I and both femurs.  The entire right femoral head and femoral neck showed significant cortical destruction.  There was a pathologic nondisplaced fracture of the left inferior pubic ramus.  Ortho was consulted (Dr. Erlinda Hong) and in his opinion the surgery the patient required could not be safely performed here but require tertiary care referral.  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 Mylo Red, to be followed by prophylactic nail of left femur.  She has a follow-up appointment tomorrow 02/11/2021 and also 03/04/2021, the latter with Dr.  Mylo Red.  In addition on 07 17 she had a superficial thrombus in the mid cephalic vein of the left upper extremity.  This was felt to not require anticoagulation 9 however she continues on prophylactic Lovenox postop).  She is scheduled to start tucatinib and to resume Xeloda this week   REVIEW OF SYSTEMS: Monica [name]'s pain is not well controlled.  She is tapering off the oxycodone and she generally hates narcotics and does very poorly with them.  Nevertheless her pain does need to be read addressed and this is done below.  As far as the central nervous system disease is concerned she denies headaches, visual changes, and only has nausea or vomiting issues related to her pain medications.  She is taking MiraLAX daily and stool softeners daily and had a bowel movement yesterday after 3 days not having a bowel movement.  There have been no recent falls and at home she uses her walker but the shower has a barrier which makes it very difficult for her.  She cannot get into the bathtub at present.  Recall she lives in a 2 floor walk up without an elevator.  A detailed review of systems today was otherwise stable.   COVID 19 VACCINATION STATUS: Status post Moderna x2, no booster as of December 2021  PAST MEDICAL HISTORY: Past Medical History:  Diagnosis Date   Arthritis    lower back   Cancer [facility])    right breast cancer   Family history of breast cancer    Personal history of chemotherapy    Personal history of radiation therapy   She notes that she had a heart murmur as a child, which she grew out of.    PAST SURGICAL HISTORY: Past Surgical History:  Procedure  Laterality Date   ABDOMINAL HYSTERECTOMY     APPLICATION OF CRANIAL NAVIGATION N/A 12/06/2020   Procedure: APPLICATION OF CRANIAL NAVIGATION;  Surgeon: Judith Part, MD;  Location: [place];  Service: Neurosurgery;  Laterality: N/A;  RM 20   BREAST LUMPECTOMY Right    BREAST LUMPECTOMY WITH RADIOACTIVE SEED AND SENTINEL LYMPH NODE  BIOPSY Right 02/16/2018   Procedure: RIGHT BREAST LUMPECTOMY WITH RADIOACTIVE SEED AND SENTINEL LYMPH NODE BIOPSY;  Surgeon: [name], MD;  Location: [place];  Service: General;  Laterality: Right;   CRANIOTOMY Right 12/06/2020   Procedure: Right sided awake craniotomy for tumor resection;  Surgeon: Judith Part, MD;  Location: [name];  Service: Neurosurgery;  Laterality: Right;   HYMENECTOMY     IR US GUIDE BX ASP/DRAIN  12/02/2020   PORTACATH PLACEMENT Right 02/16/2018   Procedure: INSERTION PORT-A-CATH;  Surgeon: [name], MD;  Location: [place];  Service: General;  Laterality: Right;   RE-EXCISION OF BREAST LUMPECTOMY Right 02/22/2018   Procedure: RE-EXCISION OF RIGHT  BREAST LUMPECTOMY;  Surgeon: [name], MD;  Location: [place];  Service: General;  Laterality: Right;   REPAIR VAGINAL CUFF N/A 02/07/2017   Procedure: REPAIR VAGINAL CUFF;  Surgeon: Ena Dawley, MD;  Location: [place] [facility];  Service: Gynecology;  Laterality: N/A;   ROBOTIC ASSISTED TOTAL HYSTERECTOMY WITH SALPINGECTOMY Left 01/20/2017   Procedure: ROBOTIC ASSISTED TOTAL HYSTERECTOMY WITH SALPINGECTOMY;  Surgeon: [name], MD;  Location: [place] [facility];  Service: Gynecology;  Laterality: Left;  Partial Hysterectomy without BSO   FAMILY HISTORY Family History  Problem Relation Age of Onset   Lung cancer Maternal Grandfather    Esophageal cancer Paternal Grandfather 58   Breast cancer Cousin 10  As of July 2019, the patient's father is alive at 81. The patient's mother is also alive at 42. The patient has 1 brother and 5 sisters. There was a maternal grandfather diagnosed with lung cancer at 48. There was a paternal grandfather diagnosed with esophageal cancer at 8. The patient's father had a pre-cancerous esophageal finding at age 49. There was also a maternal 1st cousin diagnosed with metastatic breast cancer at age 28.    GYNECOLOGIC HISTORY:  Patient's  last menstrual period was 12/21/2016 (exact date). Menarche: 50 years old Age at first live birth: 50 years old She is GXP2. She is status post partial hysterectomy without BSO in 2018 She never used HRT. She used oral contraception over 21 years ago with no complications.   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 The patient is separated from her husband, [name]. The patient's son Monica [name] is in the [employer] and is stationed in [place] (for 3 years beginning January 2020). He plans on working in the railroad industry after he returns. The patient's daughter Monica [name], age 4, works at Engineer, maintenance    [place]: Not in place; at the 06/20/2020 visit the patient was given the appropriate documents to complete and notarized at her discretion   HEALTH MAINTENANCE: Social History   Tobacco Use   Smoking status: Never   Smokeless tobacco: Never  Vaping Use   Vaping Use: Some days  Substance Use Topics   Alcohol use: Yes    Comment: occ   Drug use: No     Colonoscopy:   PAP: 2018/ prior to hysterectomy  Bone density:   No Known Allergies  Current Outpatient Medications  Medication Sig  Dispense Refill   DULoxetine (CYMBALTA) 20 MG capsule Take 1 capsule (20 mg total) by mouth daily. 30 capsule 3   traMADol (ULTRAM) 50 MG tablet Take 1 tablet (50 mg total) by mouth every 6 (six) hours as needed. 120 tablet 0   acetaminophen (TYLENOL) 500 MG tablet Take 1 tablet (500 mg total) by mouth 3 (three) times daily as needed for mild pain (or Fever >/= 101). Take with ibuprofen 400 mg     b complex vitamins capsule Take 1 capsule by mouth in the morning and at bedtime.     BIOTIN PO Take 1 capsule by mouth in the morning and at bedtime.     cholecalciferol (VITAMIN D3) 25 MCG (1000 UNIT) tablet Take 1 tablet (1,000 Units total) by mouth daily.     enoxaparin (LOVENOX) 40 MG/0.4ML injection Inject 0.4  mLs (40 mg total) into the skin daily. 0 mL    ferrous sulfate 325 (65 FE) MG tablet Take 325 mg by mouth daily with breakfast.     gabapentin (NEURONTIN) 300 MG capsule Take 1 capsule (300 mg total) by mouth 3 (three) times daily. 90 capsule 6   ibuprofen (ADVIL) 400 MG tablet Take 1 tablet (400 mg total) by mouth 3 (three) times daily. Take with tylenol 500 mg     lidocaine (LIDODERM) 5 % Place 1 patch onto the skin daily as needed (for pain- Remove & Discard patch within 12 hours or as directed by MD).     omeprazole (PRILOSEC) 40 MG capsule Take 1 capsule (40 mg total) by mouth daily before supper.     ondansetron (ZOFRAN ODT) 4 MG disintegrating tablet Take 1 tablet (4 mg total) by mouth See admin instructions. Dissolve 4 mg in the mouth three times a day and an additional 4 mg at bedtime as needed for nausea/vomiting     oxyCODONE (OXY IR/ROXICODONE) 5 MG immediate release tablet Take 1-2 tablets (5-10 mg total) by mouth every 4 (four) hours as needed for breakthrough pain. 30 tablet 0   polyethylene glycol (MIRALAX / GLYCOLAX) 17 g packet Take 17 g by mouth daily as needed for moderate constipation. 14 each 0   prochlorperazine (COMPAZINE) 10 MG tablet Take 1 tablet (10 mg total) by mouth every 6 (six) hours.     promethazine (PHENERGAN) 25 MG suppository Place 1 suppository (25 mg total) rectally every 6 (six) hours as needed for nausea or vomiting. 12 each 0   senna-docusate (SENOKOT-S) 8.6-50 MG tablet Take 2 tablets by mouth 2 (two) times daily.     traZODone (DESYREL) 50 MG tablet Take 1 tablet (50 mg total) by mouth at bedtime as needed for sleep.     No current facility-administered medications for this visit.   Facility-Administered Medications Ordered in Other Visits  Medication Dose Route Frequency Provider Last Rate Last Admin   sodium chloride flush (NS) 0.9 % injection 10 mL  10 mL Intracatheter Once Doy Taaffe, [name], MD        OBJECTIVE: White woman examined in a  wheelchair  Vitals:   02/10/21 0952  BP: 105/88  Pulse: (!) 102  Resp: 18  Temp: (!) 97.5 F (36.4 C)  SpO2: 97%      There is no height or weight on file to calculate BMI.   Wt Readings from Last 3 Encounters:  01/14/21 171 lb 15.3 oz (78 kg)  01/09/21 171 lb (77.6 kg)  01/07/21 171 lb 6 oz (77.7 kg)  ECOG FS:1  Sclerae unicteric, EOMs intact, pupils round and equal Wearing a mask No cervical or supraclavicular adenopathy Lungs no rales or rhonchi Heart regular rate and rhythm Abd soft, nontender, positive bowel sounds MSK moderate tenderness mid upper thoracic spine Neuro: nonfocal, well oriented, positive affect Breasts: Deferred   LAB RESULTS:  CMP     Component Value Date/Time   NA 138 02/10/2021 0946   K 3.9 02/10/2021 0946   CL 101 02/10/2021 0946   CO2 26 02/10/2021 0946   GLUCOSE 114 (H) 02/10/2021 0946   BUN 9 02/10/2021 0946   CREATININE 0.68 02/10/2021 0946   CREATININE 0.81 06/20/2020 0839   CALCIUM 11.0 (H) 02/10/2021 0946   PROT 6.7 02/10/2021 0946   ALBUMIN 3.0 (L) 02/10/2021 0946   AST 69 (H) 02/10/2021 0946   AST 19 06/20/2020 0839   ALT 25 02/10/2021 0946   ALT 14 06/20/2020 0839   ALKPHOS 220 (H) 02/10/2021 0946   BILITOT 0.6 02/10/2021 0946   BILITOT 0.5 06/20/2020 0839   GFRNONAA >60 02/10/2021 0946   GFRNONAA >60 06/20/2020 0839   GFRAA >60 06/13/2019 1138    Lab Results  Component Value Date   WBC 6.6 02/10/2021   NEUTROABS 5.2 02/10/2021   HGB 9.9 (L) 02/10/2021   HCT 30.8 (L) 02/10/2021   MCV 92.2 02/10/2021   PLT 315 02/10/2021    No results found for: LABCA2  No components found for: [id number]  No results for input(s): INR in the last 168 hours.  No results found for: LABCA2  No results found for: CAN199  Lab Results  Component Value Date   CAN125 535.0 (H) 01/07/2021    Lab Results  Component Value Date   CAN153 648.0 (H) 01/07/2021    Lab Results  Component Value Date   CA2729 810.4 (H) 01/07/2021     No components found for: HGQUANT  No results found for: CEA1 / No results found for: CEA1   No results found for: AFPTUMOR  No results found for: CHROMOGRNA  No results found for: TOTALPROTELP, ALBUMINELP, A1GS, A2GS, BETS, BETA2SER, GAMS, MSPIKE, SPEI (this displays SPEP labs)  No results found for: KPAFRELGTCHN, LAMBDASER, KAPLAMBRATIO (kappa/lambda light chains)  No results found for: HGBA, HGBA2QUANT, HGBFQUANT, HGBSQUAN (Hemoglobinopathy evaluation)   No results found for: LDH  Lab Results  Component Value Date   IRON 69 01/16/2021   TIBC 361 01/16/2021   IRONPCTSAT 19 01/16/2021   (Iron and TIBC)  Lab Results  Component Value Date   FERRITIN 2,032 (H) 01/16/2021    Urinalysis    Component Value Date/Time   COLORURINE YELLOW 11/30/2020 1315   APPEARANCEUR HAZY (A) 11/30/2020 1315   LABSPEC 1.009 11/30/2020 1315   PHURINE 5.0 11/30/2020 1315   GLUCOSEU NEGATIVE 11/30/2020 1315   HGBUR NEGATIVE 11/30/2020 1315   BILIRUBINUR NEGATIVE 11/30/2020 1315   KETONESUR NEGATIVE 11/30/2020 1315   PROTEINUR NEGATIVE 11/30/2020 1315   NITRITE NEGATIVE 11/30/2020 1315   LEUKOCYTESUR TRACE (A) 11/30/2020 1315    STUDIES: CT PELVIS WO CONTRAST  Addendum Date: 01/16/2021   ADDENDUM REPORT: 01/16/2021 12:44 ADDENDUM: Upon further review, there has been interval development of significant metastatic disease to the bilateral proximal femurs compared to the relatively recent prior study dated 11/30/2020. On the right, several cortical lucencies are visible in the region of the subcapital and mid femoral neck highly concerning for pathologic fracture. Metastatic disease also extends into the femoral head itself. On the left, and anteriorly located lytic lesion appears to result in  cortical breakthrough along the anterior surface of the proximal femoral neck (see sagittal images 153 in 154 of series 8). These findings may represent the primary source of the patient's progressive  bilateral hip pain. These results will be called to the ordering clinician or representative by the Radiologist Assistant, and communication documented in the PACS or [employer]. Electronically Signed   By: Jacqulynn Cadet M.D.   On: 01/16/2021 12:44   Result Date: 01/16/2021 CLINICAL DATA:  Breast cancer, pelvic pain, known osseous metastases EXAM: CT PELVIS WITHOUT CONTRAST TECHNIQUE: Multidetector CT imaging of the pelvis was performed following the standard protocol without intravenous contrast. COMPARISON:  CT abdomen/pelvis dated 11/30/2020 FINDINGS: Urinary Tract:  Bladder is within normal limits. Bowel:  Visualized bowel is unremarkable. Vascular/Lymphatic: No evidence of aneurysm. No suspicious pelvic lymphadenopathy. Reproductive:  Status post hysterectomy. Bilateral ovaries are within normal limits. Other:  No pelvic ascites. Subcutaneous injection sites along the left lower abdominal wall (series 6/image 68). Musculoskeletal: Interval superior endplate compression fracture deformity at L4 (sagittal image 102), with 20% loss of height centrally, but no retropulsion. This reflects a pathologic fracture with central lytic lesion. Otherwise, this appearance is overall unchanged. Multifocal lytic/sclerotic metastases in the lower lumbar spine (series 3/image 11), sacrum series 3/image 35), left iliac bone (series 3/image 43), right femoral head (series 3/image 91), left femoral head/neck (series 3/image 101), and bilateral inferior pubic rami/parasymphyseal regions (series 3/image 106). IMPRESSION: Interval superior endplate compression fracture deformity at L4, with 20% loss of height centrally, but no retropulsion. This reflects a pathologic fracture. Additional multifocal lytic/sclerotic osseous metastases in the visualized axial and appendicular skeleton, unchanged. Electronically Signed: By: Julian Hy M.D. On: 01/14/2021 19:59   MR PELVIS W WO CONTRAST  Result Date:  01/17/2021 CLINICAL DATA:  Metastatic disease, evaluate for femur fracture EXAM: MRI PELVIS WITHOUT AND WITH CONTRAST MRI FEMUR WITHOUT AND WITH CONTRAST TECHNIQUE: Multiplanar multisequence MR imaging of the pelvis was performed both before and after administration of intravenous contrast. Multiplanar multisequence MR imaging of the femur was performed both before and after administration of intravenous contrast. CONTRAST:  1mL GADAVIST GADOBUTROL 1 MMOL/ML IV SOLN COMPARISON:  CT pelvis 01/14/2021 FINDINGS: Bones: Numerous abnormal enhancing bone lesions in the lower lumbar vertebral bodies, sacrum, ilium, bilateral acetabulum, and bilateral proximal femurs. Abnormal bone lesion encompasses nearly the entirety of the left side of the sacrum and ilium with possible nondisplaced pathologic fracture of the left sacral ala. Abnormal bone lesion in the anterior left femoral neck with focal severe cortical destruction placing the patient at high risk for a pathologic fracture. Abnormal bone lesion encompasses nearly the entirety of the right femoral head and femoral neck with severe cortical destruction of the anterior femoral head-neck junction placing the patient at high risk for a pathologic fracture. Although no acute fracture is identified on this MRI, there is cortical irregularity along the recent CT of the pelvis performed on 01/14/2021 concerning for a nondisplaced pathologic fracture. Abnormal bone lesions throughout the inferior pubic rami bilaterally. Pathologic nondisplaced fracture of the left inferior pubic ramus. Abnormal bone lesions in the right distal femoral diaphysis and metaphysis with cortical destruction of the posterolateral margin. At small abnormal bone lesion in the mid left femoral diaphysis. Abnormal bone lesions in the left distal femoral metaphysis. No other acute fracture or dislocation. Normal sacrum and sacroiliac joints. No SI joint widening or erosive changes. Articular cartilage and  labrum Articular cartilage:  No chondral defect. Labrum: Grossly intact, but evaluation is  limited by lack of intraarticular fluid. Joint or bursal effusion Joint effusion: Large right hip joint effusion. No left hip joint effusion. No right SI joint effusion. Small left SI joint effusion. Bursae: No bursa formation. Muscles and tendons Flexors: Normal. Extensors: Normal. Abductors: Normal. Adductors: Normal. Gluteals: Mild edema in the left gluteus minimus muscle likely reflecting mild muscle strain. Hamstrings: Normal. Other findings No pelvic free fluid. No fluid collection or hematoma. No inguinal lymphadenopathy. No inguinal hernia. IMPRESSION: 1. Extensive osseous metastatic disease likely secondary to breast cancer involving the lower lumbar vertebral bodies, sacrum, ilium, bilateral acetabulum, and bilateral femurs as described above. 2. Abnormal bone lesion encompasses nearly the entirety of the right femoral head and femoral neck with severe cortical destruction of the anterior femoral head-neck junction placing the patient at high risk for a pathologic fracture. Although no acute fracture is identified on this MRI, there is cortical irregularity on the recent CT of the pelvis performed on 01/14/2021 concerning for a nondisplaced pathologic fracture. 3. Pathologic nondisplaced fracture of the left inferior pubic ramus. 4. Abnormal bone lesion in the anterior left femoral neck with focal severe cortical destruction placing the patient at high risk for a pathologic fracture. 5. Abnormal bone lesion encompasses nearly the entirety of the left side of the sacrum and ilium with possible nondisplaced pathologic fracture of the left sacral ala. 6. Large right hip joint effusion. 7. Mild edema in the left gluteus minimus muscle likely reflecting mild muscle strain. Electronically Signed   By: Kathreen Devoid   On: 01/17/2021 07:34   MR FEMUR RIGHT W WO CONTRAST  Result Date: 01/17/2021 CLINICAL DATA:  Metastatic  disease, evaluate for femur fracture EXAM: MRI PELVIS WITHOUT AND WITH CONTRAST MRI FEMUR WITHOUT AND WITH CONTRAST TECHNIQUE: Multiplanar multisequence MR imaging of the pelvis was performed both before and after administration of intravenous contrast. Multiplanar multisequence MR imaging of the femur was performed both before and after administration of intravenous contrast. CONTRAST:  36m GADAVIST GADOBUTROL 1 MMOL/ML IV SOLN COMPARISON:  CT pelvis 01/14/2021 FINDINGS: Bones: Numerous abnormal enhancing bone lesions in the lower lumbar vertebral bodies, sacrum, ilium, bilateral acetabulum, and bilateral proximal femurs. Abnormal bone lesion encompasses nearly the entirety of the left side of the sacrum and ilium with possible nondisplaced pathologic fracture of the left sacral ala. Abnormal bone lesion in the anterior left femoral neck with focal severe cortical destruction placing the patient at high risk for a pathologic fracture. Abnormal bone lesion encompasses nearly the entirety of the right femoral head and femoral neck with severe cortical destruction of the anterior femoral head-neck junction placing the patient at high risk for a pathologic fracture. Although no acute fracture is identified on this MRI, there is cortical irregularity along the recent CT of the pelvis performed on 01/14/2021 concerning for a nondisplaced pathologic fracture. Abnormal bone lesions throughout the inferior pubic rami bilaterally. Pathologic nondisplaced fracture of the left inferior pubic ramus. Abnormal bone lesions in the right distal femoral diaphysis and metaphysis with cortical destruction of the posterolateral margin. At small abnormal bone lesion in the mid left femoral diaphysis. Abnormal bone lesions in the left distal femoral metaphysis. No other acute fracture or dislocation. Normal sacrum and sacroiliac joints. No SI joint widening or erosive changes. Articular cartilage and labrum Articular cartilage:  No  chondral defect. Labrum: Grossly intact, but evaluation is limited by lack of intraarticular fluid. Joint or bursal effusion Joint effusion: Large right hip joint effusion. No left hip joint effusion. No  right SI joint effusion. Small left SI joint effusion. Bursae: No bursa formation. Muscles and tendons Flexors: Normal. Extensors: Normal. Abductors: Normal. Adductors: Normal. Gluteals: Mild edema in the left gluteus minimus muscle likely reflecting mild muscle strain. Hamstrings: Normal. Other findings No pelvic free fluid. No fluid collection or hematoma. No inguinal lymphadenopathy. No inguinal hernia. IMPRESSION: 1. Extensive osseous metastatic disease likely secondary to breast cancer involving the lower lumbar vertebral bodies, sacrum, ilium, bilateral acetabulum, and bilateral femurs as described above. 2. Abnormal bone lesion encompasses nearly the entirety of the right femoral head and femoral neck with severe cortical destruction of the anterior femoral head-neck junction placing the patient at high risk for a pathologic fracture. Although no acute fracture is identified on this MRI, there is cortical irregularity on the recent CT of the pelvis performed on 01/14/2021 concerning for a nondisplaced pathologic fracture. 3. Pathologic nondisplaced fracture of the left inferior pubic ramus. 4. Abnormal bone lesion in the anterior left femoral neck with focal severe cortical destruction placing the patient at high risk for a pathologic fracture. 5. Abnormal bone lesion encompasses nearly the entirety of the left side of the sacrum and ilium with possible nondisplaced pathologic fracture of the left sacral ala. 6. Large right hip joint effusion. 7. Mild edema in the left gluteus minimus muscle likely reflecting mild muscle strain. Electronically Signed   By: Kathreen Devoid   On: 01/17/2021 07:34   DG Shoulder Left  Result Date: 01/19/2021 CLINICAL DATA:  Shoulder pain EXAM: LEFT SHOULDER - 2+ VIEW COMPARISON:   Chest CT Nov 30, 2020 and radiograph December 28, 2020. FINDINGS: There is no evidence of fracture or dislocation. There is no evidence of arthropathy or other focal bone abnormality. Soft tissues are unremarkable. IMPRESSION: No acute osseous abnormality. Electronically Signed   By: Dahlia Bailiff MD   On: 01/19/2021 15:59   ECHOCARDIOGRAM COMPLETE  Result Date: 01/14/2021    ECHOCARDIOGRAM REPORT   Patient Name:   Monica [name] Date of Exam: 01/14/2021 Medical Rec #:  [id number]     Height:       66.0 in Accession #:    [id number]    Weight:       171.0 lb Date of Birth:  25-Oct-1970     BSA:          1.871 m Patient Age:    37 years      BP:           113/72 mmHg Patient Gender: F             HR:           94 bpm. Exam Location:  Outpatient Procedure: 2D Echo, Cardiac Doppler, Color Doppler and Strain Analysis Indications:    Chemo evaluation  History:        Patient has prior history of Echocardiogram examinations, most                 recent 06/14/2018. Signs/Symptoms:Breast cancer.  Sonographer:    Dustin Flock Referring Phys: [place]  1. Left ventricular ejection fraction, by estimation, is 60 to 65%. The left ventricle has normal function. The left ventricle has no regional wall motion abnormalities. Left ventricular diastolic parameters were normal.  2. Right ventricular systolic function is normal. The right ventricular size is normal. Tricuspid regurgitation signal is inadequate for assessing PA pressure.  3. The mitral valve is grossly normal. No evidence of mitral valve regurgitation. No evidence of mitral  stenosis.  4. The aortic valve is tricuspid. Aortic valve regurgitation is not visualized. No aortic stenosis is present.  5. The inferior vena cava is normal in size with greater than 50% respiratory variability, suggesting right atrial pressure of 3 mmHg. Conclusion(s)/Recommendation(s): Normal biventricular function without evidence of hemodynamically significant valvular  heart disease. FINDINGS  Left Ventricle: Left ventricular ejection fraction, by estimation, is 60 to 65%. The left ventricle has normal function. The left ventricle has no regional wall motion abnormalities. Global longitudinal strain performed but not reported based on interpreter judgement due to suboptimal tracking. The left ventricular internal cavity size was normal in size. There is no left ventricular hypertrophy. Left ventricular diastolic parameters were normal. Right Ventricle: The right ventricular size is normal. No increase in right ventricular wall thickness. Right ventricular systolic function is normal. Tricuspid regurgitation signal is inadequate for assessing PA pressure. Left Atrium: Left atrial size was normal in size. Right Atrium: Right atrial size was normal in size. Pericardium: Trivial pericardial effusion is present. Presence of pericardial fat pad. Mitral Valve: The mitral valve is grossly normal. No evidence of mitral valve regurgitation. No evidence of mitral valve stenosis. Tricuspid Valve: The tricuspid valve is grossly normal. Tricuspid valve regurgitation is trivial. No evidence of tricuspid stenosis. Aortic Valve: The aortic valve is tricuspid. Aortic valve regurgitation is not visualized. No aortic stenosis is present. Pulmonic Valve: The pulmonic valve was grossly normal. Pulmonic valve regurgitation is not visualized. No evidence of pulmonic stenosis. Aorta: The aortic root and ascending aorta are structurally normal, with no evidence of dilitation. Venous: The right lower pulmonary vein is normal. The inferior vena cava is normal in size with greater than 50% respiratory variability, suggesting right atrial pressure of 3 mmHg. IAS/Shunts: The atrial septum is grossly normal.  LEFT VENTRICLE PLAX 2D LVIDd:         4.00 cm     Diastology LVIDs:         2.50 cm     LV e' medial:    5.00 cm/s LV PW:         0.90 cm     LV E/e' medial:  15.9 LV IVS:        1.10 cm     LV e' lateral:    6.64 cm/s LVOT diam:     2.00 cm     LV E/e' lateral: 12.0 LV SV:         65 LV SV Index:   35 LVOT Area:     3.14 cm  LV Volumes (MOD) LV vol d, MOD A4C: 72.7 ml LV vol s, MOD A4C: 29.8 ml LV SV MOD A4C:     72.7 ml RIGHT VENTRICLE RV Basal diam:  2.60 cm RV S prime:     14.60 cm/s TAPSE (M-mode): 2.2 cm LEFT ATRIUM             Index       RIGHT ATRIUM           Index LA diam:        2.40 cm 1.28 cm/m  RA Area:     11.40 cm LA Vol (A2C):   27.8 ml 14.86 ml/m RA Volume:   23.30 ml  12.45 ml/m LA Vol (A4C):   30.2 ml 16.14 ml/m LA Biplane Vol: 29.2 ml 15.61 ml/m  AORTIC VALVE LVOT Vmax:   110.00 cm/s LVOT Vmean:  72.300 cm/s LVOT VTI:    0.208 m  AORTA Ao Root diam:  2.80 cm MITRAL VALVE MV Area (PHT): 3.68 cm    SHUNTS MV Decel Time: 206 msec    Systemic VTI:  0.21 m MV E velocity: 79.50 cm/s  Systemic Diam: 2.00 cm MV A velocity: 69.40 cm/s MV E/A ratio:  1.15 Eleonore Chiquito MD Electronically signed by Eleonore Chiquito MD Signature Date/Time: 01/14/2021/7:04:07 PM    Final    DG FEMUR PORT, MIN 2 VIEWS RIGHT  Result Date: 01/16/2021 CLINICAL DATA:  Right femur pain history of cancer with Mets EXAM: RIGHT FEMUR PORTABLE 2 VIEW COMPARISON:  CT 01/14/2021 FINDINGS: Focal permeative appearance of the distal femoral shaft, raises concern for metastatic disease. Lucent lesion seen on recent pelvic CT within the proximal femur is better seen on CT. No malalignment. Suspected right femoral neck fracture is also better seen on CT. IMPRESSION: 1. CT demonstrated metastatic lucent lesions on recent pelvic CT at the proximal right femur better demonstrated on CT. Possible subtle step-off at the femoral neck, may correspond to suspected femoral neck fracture on CT. 2. Focal permeative appearance of the distal shaft of the right femur concerning for metastatic focus. Electronically Signed   By: Donavan Foil M.D.   On: 01/16/2021 18:23      ELIGIBLE FOR AVAILABLE RESEARCH PROTOCOL: BCEP  ASSESSMENT: 50 y.o.   [place], [place] woman status post right breast upper inner quadrant biopsy 12/29/2017, for a clinical T1c N0, stage IA invasive ductal carcinoma, triple positive, with an MIB-1 of 80%.  (1) genetics testing 02/02/2018 though the CancerNext gene panel offered by [facility] showed no deleterious mutations in  APC, ATM, BARD1, BMPR1A,BRCA1, BRCA2, BRIP1, CDH1, CDK4, CDKN2A, CHEK2, DICER1, HOXB13, MLH1, MRE11A, MSH2, MSH6, MUTYH, NBN, NF1, PALB2, PMS2, POLD1, POLE, PTEN, RAD50, RAD51C, RAD51D, SMAD4, SMARCA4, STK11 and TP53 (sequencing and deletion/duplication); EPCAM and GREM1 (deletion/duplication only).  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>A) )   (2) right lumpectomy and sentinel lymph node sampling 02/16/2018 showed a pT2 pN0, stage IB invasive ductal carcinoma, grade 3, with a positive inferior margin; a total of 5 lymph nodes were removed  (a) additional surgery 02/27/2018 cleared the margins  (3) started carboplatin, docetaxel, trastuzumab and pertuzumab 03/11/2018, repeated every 21 days x 6, last dose 07/18/2018.    (a) Pertuzumab omitted after cycle 1 due to diarrhea.  (b) Gemcitabine substituted for docetaxel starting with cycle 5 due to peripheral neuropathy  (4) continued trastuzumab to total 6 months (through February 2020).  (a) echocardiogram 01/21/2018 showed an ejection fraction in the 55-60% range  (b) repeat echocardiogram 06/14/2018 showed an ejection fraction in the 60-65%  (5) adjuvant radiation 08/15/2018 - 09/28/2018  Site/dose:   The patient initially received a dose of 50.4 Gy in 28 fractions to the right breast using whole-breast tangent fields. This was delivered using a 3-D conformal technique. The patient then received a boost to the seroma. This delivered an additional 10 Gy in 5 fractions using 12E, 9E electrons with a special teletherapy technique. The total dose was 60.4 Gy.   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, involving brain, liver, lungs, and bones (7) presenting 11/29/2020 with progressive back pain:             (a) non-contrast CT abd/pelvis (renal stone study) 11/29/2020 shows bone metastases             (  b) CT chest/abd/pelvis with contrast 11/30/2020 shows multiple large liver masses, multiple pulmonary nodules, and widespread lytic bone lesions             (c) MRI brain shows at least 4 brain lesions, largest 3.8 cm             (d) total spinal MRI 11/30/2020 shows pathologic fractures at T9, T12, possibly L2, as well as numerous other spine lesions, but no epidural enhancement or cord compression             (e) liver biopsy 12/02/2020 shows adenocarcinoma, prognostic panel again triple positive             (f) pre-op repeat MRI 12/01/2020 shows additional brain lesions, at least 8 of which will be targets for SRS   (8) CNS treatment:             (a) [place] 12/05/2020             (b) surgery 12/06/2020 confirmed metastatic carcinoma, estrogen receptor positive, HER2 amplified, progesterone receptor negative, with an MIB-1 of 70%.   (9) XRT to spine 12/05/2020 through 12/23/2020 Site Technique Total Dose (Gy) Dose per Fx (Gy) Completed Fx Beam Energies  Thoracic Spine: Spine_T9-L3 Complex 30/30 3 10/10 15X  Pelvis: Pelvis_sacrum Complex 30/30 3 10/10 15X   (10) advanced directives-- plans to name son as HCPOA with her father as secondary   (11) started capecitabine 01/08/2021, trastuzumab 01/20/2021, tucatinib 02/10/2021  (12) to start denosumab/Xgeva 01/15/2021, repeated every 6 weeks  (A) received one dose pamidronate in hospital 01/16/2021  (13) pain management:  (a) Tylenol 500 mg and Advil 400 mg 3 times a day with food  (b) gabapentin 3 times a day as needed  (c) tramadol 50 mg 4 times a day as  needed if above not adequate  (d) bowel prophylaxis: MiraLAX daily, stool softeners 2 tablets twice daily  PLAN: Monica [name] clearly benefited from her surgery under Dr. Warren Lacy whom she will see again tomorrow.  We discussed pain issues.  The goal of pain medication is to optimize function.  It is not good to simply control pain if the patient is asleep all the time.  Monica [name] is a very active person and she understands this very well.  Normal activity as much as possible is her goal.  Accordingly we are starting the pain management regiment noted above.  I am hopeful that she will not need much tramadol as she has had difficulty even with mild narcotics in the past.  Long-term I am not sure she is going to be able to stay in her apartment, which is 2 flights up without an elevator and has an inaccessible bathtub.  Family I will be discussing this with her.  I have placed a referral to neurosurgery for consideration of upper thoracic spine kyphoplasty.  (This could also be done at [facility] if that is her preference).  Otherwise we are proceeding with trastuzumab today.  I am starting her tucatinib and capecitabine 02/12/2021 and then she will return to see me again in 3weeks.  At that time we will start setting her up for restaging studies  She knows to call us for any other issue that may develop before then.  Total encounter time 35 minutes.Sarajane Jews C. Keani Gotcher, MD  02/10/21 7:20 PM [facility] [place] [address], [place] [address] Tel. [phone]    Fax. [phone]   Pat Kocher, am  acting as scribe for Dr. Sarajane Jews C. Destyni Hoppel.  I, Lurline Del MD, have reviewed the above documentation for accuracy and completeness, and I agree with the above.   *Total Encounter Time as defined by the Centers for Medicare and Medicaid Services includes, in addition to the face-to-face time of a patient visit (documented in the note above)  non-face-to-face time: obtaining and reviewing outside history, ordering and reviewing medications, tests or procedures, care coordination (communications with other health care professionals or caregivers) and documentation in the medical record.

## 2021-02-10 ENCOUNTER — Inpatient Hospital Stay (HOSPITAL_BASED_OUTPATIENT_CLINIC_OR_DEPARTMENT_OTHER): Payer: 59 | Admitting: Oncology

## 2021-02-10 ENCOUNTER — Inpatient Hospital Stay: Payer: 59

## 2021-02-10 ENCOUNTER — Encounter: Payer: Self-pay | Admitting: Oncology

## 2021-02-10 ENCOUNTER — Other Ambulatory Visit: Payer: Self-pay

## 2021-02-10 VITALS — HR 99

## 2021-02-10 VITALS — BP 105/88 | HR 102 | Temp 97.5°F | Resp 18

## 2021-02-10 DIAGNOSIS — C50211 Malignant neoplasm of upper-inner quadrant of right female breast: Secondary | ICD-10-CM | POA: Diagnosis not present

## 2021-02-10 DIAGNOSIS — C7989 Secondary malignant neoplasm of other specified sites: Secondary | ICD-10-CM

## 2021-02-10 DIAGNOSIS — Z79899 Other long term (current) drug therapy: Secondary | ICD-10-CM | POA: Insufficient documentation

## 2021-02-10 DIAGNOSIS — C7949 Secondary malignant neoplasm of other parts of nervous system: Secondary | ICD-10-CM | POA: Diagnosis not present

## 2021-02-10 DIAGNOSIS — C7802 Secondary malignant neoplasm of left lung: Secondary | ICD-10-CM

## 2021-02-10 DIAGNOSIS — C7951 Secondary malignant neoplasm of bone: Secondary | ICD-10-CM

## 2021-02-10 DIAGNOSIS — C7931 Secondary malignant neoplasm of brain: Secondary | ICD-10-CM

## 2021-02-10 DIAGNOSIS — D496 Neoplasm of unspecified behavior of brain: Secondary | ICD-10-CM

## 2021-02-10 DIAGNOSIS — Z7189 Other specified counseling: Secondary | ICD-10-CM

## 2021-02-10 DIAGNOSIS — Z5112 Encounter for antineoplastic immunotherapy: Secondary | ICD-10-CM | POA: Insufficient documentation

## 2021-02-10 DIAGNOSIS — Z510 Encounter for antineoplastic radiation therapy: Secondary | ICD-10-CM | POA: Diagnosis not present

## 2021-02-10 DIAGNOSIS — C787 Secondary malignant neoplasm of liver and intrahepatic bile duct: Secondary | ICD-10-CM

## 2021-02-10 DIAGNOSIS — Z170 Estrogen receptor positive status [ER+]: Secondary | ICD-10-CM | POA: Insufficient documentation

## 2021-02-10 DIAGNOSIS — C7801 Secondary malignant neoplasm of right lung: Secondary | ICD-10-CM | POA: Diagnosis not present

## 2021-02-10 DIAGNOSIS — Z95828 Presence of other vascular implants and grafts: Secondary | ICD-10-CM

## 2021-02-10 DIAGNOSIS — Z20822 Contact with and (suspected) exposure to covid-19: Secondary | ICD-10-CM | POA: Insufficient documentation

## 2021-02-10 LAB — CBC WITH DIFFERENTIAL/PLATELET
Abs Immature Granulocytes: 0.11 10*3/uL — ABNORMAL HIGH (ref 0.00–0.07)
Basophils Absolute: 0.1 10*3/uL (ref 0.0–0.1)
Basophils Relative: 1 %
Eosinophils Absolute: 0.1 10*3/uL (ref 0.0–0.5)
Eosinophils Relative: 1 %
HCT: 30.8 % — ABNORMAL LOW (ref 36.0–46.0)
Hemoglobin: 9.9 g/dL — ABNORMAL LOW (ref 12.0–15.0)
Immature Granulocytes: 2 %
Lymphocytes Relative: 9 %
Lymphs Abs: 0.6 10*3/uL — ABNORMAL LOW (ref 0.7–4.0)
MCH: 29.6 pg (ref 26.0–34.0)
MCHC: 32.1 g/dL (ref 30.0–36.0)
MCV: 92.2 fL (ref 80.0–100.0)
Monocytes Absolute: 0.6 10*3/uL (ref 0.1–1.0)
Monocytes Relative: 9 %
Neutro Abs: 5.2 10*3/uL (ref 1.7–7.7)
Neutrophils Relative %: 78 %
Platelets: 315 10*3/uL (ref 150–400)
RBC: 3.34 MIL/uL — ABNORMAL LOW (ref 3.87–5.11)
RDW: 17.5 % — ABNORMAL HIGH (ref 11.5–15.5)
WBC: 6.6 10*3/uL (ref 4.0–10.5)
nRBC: 0 % (ref 0.0–0.2)

## 2021-02-10 LAB — COMPREHENSIVE METABOLIC PANEL
ALT: 25 U/L (ref 0–44)
AST: 69 U/L — ABNORMAL HIGH (ref 15–41)
Albumin: 3 g/dL — ABNORMAL LOW (ref 3.5–5.0)
Alkaline Phosphatase: 220 U/L — ABNORMAL HIGH (ref 38–126)
Anion gap: 11 (ref 5–15)
BUN: 9 mg/dL (ref 6–20)
CO2: 26 mmol/L (ref 22–32)
Calcium: 11 mg/dL — ABNORMAL HIGH (ref 8.9–10.3)
Chloride: 101 mmol/L (ref 98–111)
Creatinine, Ser: 0.68 mg/dL (ref 0.44–1.00)
GFR, Estimated: >60 mL/min (ref >60)
Glucose, Bld: 114 mg/dL — ABNORMAL HIGH (ref 70–99)
Potassium: 3.9 mmol/L (ref 3.5–5.1)
Sodium: 138 mmol/L (ref 135–145)
Total Bilirubin: 0.6 mg/dL (ref 0.3–1.2)
Total Protein: 6.7 g/dL (ref 6.5–8.1)

## 2021-02-10 MED ORDER — GABAPENTIN 300 MG PO CAPS: 300.0000 mg | ORAL_CAPSULE | Freq: Three times a day (TID) | ORAL | 6 refills | Status: DC

## 2021-02-10 MED ORDER — ACETAMINOPHEN 325 MG PO TABS
650.0000 mg | ORAL_TABLET | Freq: Once | ORAL | Status: AC
  Administered 2021-02-10: 650 mg via ORAL

## 2021-02-10 MED ORDER — DIPHENHYDRAMINE HCL 25 MG PO CAPS
ORAL_CAPSULE | ORAL | Status: AC
  Filled 2021-02-10: qty 1

## 2021-02-10 MED ORDER — IBUPROFEN 400 MG PO TABS
400.0000 mg | ORAL_TABLET | Freq: Three times a day (TID) | ORAL | Status: DC
Start: 2021-02-10 — End: 2021-02-13

## 2021-02-10 MED ORDER — DENOSUMAB 120 MG/1.7ML ~~LOC~~ SOLN
SUBCUTANEOUS | Status: AC
  Filled 2021-02-10: qty 1.7

## 2021-02-10 MED ORDER — TRAMADOL HCL 50 MG PO TABS: 50.0000 mg | ORAL_TABLET | Freq: Four times a day (QID) | ORAL | 0 refills | Status: DC | PRN

## 2021-02-10 MED ORDER — SODIUM CHLORIDE 0.9 % IV SOLN
Freq: Once | INTRAVENOUS | Status: AC
  Filled 2021-02-10: qty 250

## 2021-02-10 MED ORDER — DENOSUMAB 120 MG/1.7ML ~~LOC~~ SOLN
120.0000 mg | Freq: Once | SUBCUTANEOUS | Status: AC
  Administered 2021-02-10: 120 mg via SUBCUTANEOUS

## 2021-02-10 MED ORDER — DIPHENHYDRAMINE HCL 25 MG PO CAPS
25.0000 mg | ORAL_CAPSULE | Freq: Once | ORAL | Status: AC
  Administered 2021-02-10: 25 mg via ORAL

## 2021-02-10 MED ORDER — DULOXETINE HCL 20 MG PO CPEP: 20.0000 mg | ORAL_CAPSULE | Freq: Every day | ORAL | 3 refills | Status: DC

## 2021-02-10 MED ORDER — TRASTUZUMAB-DKST CHEMO 150 MG IV SOLR
6.0000 mg/kg | Freq: Once | INTRAVENOUS | Status: AC
  Administered 2021-02-10: 462 mg via INTRAVENOUS
  Filled 2021-02-10: qty 22

## 2021-02-10 MED ORDER — ACETAMINOPHEN 325 MG PO TABS
ORAL_TABLET | ORAL | Status: AC
  Filled 2021-02-10: qty 2

## 2021-02-10 MED ORDER — ACETAMINOPHEN 500 MG PO TABS: 500.0000 mg | ORAL_TABLET | Freq: Three times a day (TID) | ORAL | Status: DC | PRN

## 2021-02-10 MED ORDER — SODIUM CHLORIDE 0.9% FLUSH
10.0000 mL | Freq: Once | INTRAVENOUS | Status: AC
  Filled 2021-02-10: qty 10

## 2021-02-10 NOTE — Progress Notes (Signed)
Ok to proceed with Delton See today per MD.  Raul Del [place], [place] 02/10/21 12:22PM

## 2021-02-11 LAB — CANCER ANTIGEN 15-3: CA 15-3: 517 U/mL — ABNORMAL HIGH (ref 0.0–25.0)

## 2021-02-11 LAB — CA 125: Cancer Antigen (CA) 125: 525 U/mL — ABNORMAL HIGH (ref 0.0–38.1)

## 2021-02-11 LAB — CANCER ANTIGEN 27.29: CA 27.29: 521.9 U/mL — ABNORMAL HIGH (ref 0.0–38.6)

## 2021-02-13 ENCOUNTER — Other Ambulatory Visit: Payer: Self-pay | Admitting: *Deleted

## 2021-02-13 MED ORDER — GABAPENTIN 300 MG PO CAPS: 300.0000 mg | ORAL_CAPSULE | Freq: Three times a day (TID) | ORAL | 6 refills | Status: DC

## 2021-02-13 MED ORDER — IBUPROFEN 400 MG PO TABS: 400.0000 mg | ORAL_TABLET | Freq: Three times a day (TID) | ORAL | 3 refills | Status: DC

## 2021-02-14 ENCOUNTER — Other Ambulatory Visit: Payer: Self-pay | Admitting: Radiation Therapy

## 2021-02-14 DIAGNOSIS — C7931 Secondary malignant neoplasm of brain: Secondary | ICD-10-CM

## 2021-02-14 DIAGNOSIS — C7949 Secondary malignant neoplasm of other parts of nervous system: Secondary | ICD-10-CM

## 2021-02-17 ENCOUNTER — Other Ambulatory Visit: Payer: Self-pay | Admitting: Oncology

## 2021-02-17 ENCOUNTER — Telehealth: Payer: Self-pay | Admitting: Radiation Oncology

## 2021-02-17 DIAGNOSIS — C7931 Secondary malignant neoplasm of brain: Secondary | ICD-10-CM

## 2021-02-17 DIAGNOSIS — C7951 Secondary malignant neoplasm of bone: Secondary | ICD-10-CM

## 2021-02-17 NOTE — Telephone Encounter (Signed)
I called the patient to check on how she is doing, she has been home for about a week and a half, and is doing well with her recovery.  She is going to see her PCP on Wednesday next week.  We discussed the rationale for considering palliative radiotherapy to the right femur she also had an MRI scan that showed concerns for a lucency in the left femoral neck, and I will talk with Dr. [name] about whether or not he would like to treat this area as well as it may be given her prior treatment course to include a portion of the pelvis.  We discussed the risks, benefits, short and long-term effects of radiotherapy and the patient is interested in proceeding, she will come in on Wednesday for simulation at which time she will sign written consent to proceed.  She is encouraged that I will call her tomorrow as well to determine if Dr. [name] feels that there is a benefit of adding radiotherapy to the left femur as well

## 2021-02-18 ENCOUNTER — Other Ambulatory Visit: Payer: Self-pay | Admitting: *Deleted

## 2021-02-18 ENCOUNTER — Telehealth: Payer: Self-pay | Admitting: Radiation Oncology

## 2021-02-18 DIAGNOSIS — C7931 Secondary malignant neoplasm of brain: Secondary | ICD-10-CM

## 2021-02-18 DIAGNOSIS — Z170 Estrogen receptor positive status [ER+]: Secondary | ICD-10-CM

## 2021-02-18 DIAGNOSIS — C7951 Secondary malignant neoplasm of bone: Secondary | ICD-10-CM

## 2021-02-18 DIAGNOSIS — D496 Neoplasm of unspecified behavior of brain: Secondary | ICD-10-CM

## 2021-02-18 DIAGNOSIS — C50211 Malignant neoplasm of upper-inner quadrant of right female breast: Secondary | ICD-10-CM

## 2021-02-18 DIAGNOSIS — C7989 Secondary malignant neoplasm of other specified sites: Secondary | ICD-10-CM

## 2021-02-18 MED ORDER — PROCHLORPERAZINE MALEATE 10 MG PO TABS: 10.0000 mg | ORAL_TABLET | Freq: Four times a day (QID) | ORAL | 3 refills | Status: DC

## 2021-02-18 MED ORDER — PROCHLORPERAZINE MALEATE 10 MG PO TABS: 10.0000 mg | ORAL_TABLET | Freq: Four times a day (QID) | ORAL | Status: DC

## 2021-02-18 NOTE — Telephone Encounter (Signed)
I called to let the patient know Dr. [name] did recommend treatment to both femurs and she will have simulation for them both on Thursday.

## 2021-02-20 ENCOUNTER — Encounter: Payer: Self-pay | Admitting: Oncology

## 2021-02-20 ENCOUNTER — Other Ambulatory Visit: Payer: Self-pay

## 2021-02-20 ENCOUNTER — Telehealth: Payer: Self-pay | Admitting: *Deleted

## 2021-02-20 ENCOUNTER — Ambulatory Visit: Admission: RE | Admit: 2021-02-20 | Payer: 59 | Source: Ambulatory Visit | Admitting: Radiation Oncology

## 2021-02-20 DIAGNOSIS — C7949 Secondary malignant neoplasm of other parts of nervous system: Secondary | ICD-10-CM | POA: Insufficient documentation

## 2021-02-20 DIAGNOSIS — C7931 Secondary malignant neoplasm of brain: Secondary | ICD-10-CM | POA: Insufficient documentation

## 2021-02-20 DIAGNOSIS — Z510 Encounter for antineoplastic radiation therapy: Secondary | ICD-10-CM | POA: Insufficient documentation

## 2021-02-20 DIAGNOSIS — Z5112 Encounter for antineoplastic immunotherapy: Secondary | ICD-10-CM | POA: Insufficient documentation

## 2021-02-20 DIAGNOSIS — C50211 Malignant neoplasm of upper-inner quadrant of right female breast: Secondary | ICD-10-CM | POA: Insufficient documentation

## 2021-02-20 DIAGNOSIS — C7951 Secondary malignant neoplasm of bone: Secondary | ICD-10-CM | POA: Insufficient documentation

## 2021-02-20 DIAGNOSIS — Z170 Estrogen receptor positive status [ER+]: Secondary | ICD-10-CM | POA: Insufficient documentation

## 2021-02-20 NOTE — Telephone Encounter (Signed)
Georgean called to this RN earlier this week inquiring about " referral for kyphoplasty to my back ".  Noted Dr Jana Hakim entered referral to Dr Vertell Limber at [facility] per above.  Per call to above office - noted pt is being seen by Dr Zada Finders already with recent visit - kypho plasty discussed by not scheduled.  This RN informed Dr Jana Hakim of above.  Dr Jana Hakim is hoping the patient can have the procedure scheduled for pain control and improved function.  This RN contacted Dr Colleen Can and spoke with his assistant per above- she will communicate above with MD - as well as this RN gave direct phone number for Dr Jana Hakim for possible need for Dr Zada Finders to contact him to coordinate care.

## 2021-02-26 ENCOUNTER — Telehealth: Payer: Self-pay | Admitting: *Deleted

## 2021-02-26 NOTE — Telephone Encounter (Signed)
Pt left VM stating she is having unresolved vaginal yeast issue not relieved with OTC medication. She stated she has diflucan in the home and started it yesterday at 1 tab a day. She is requesting further advice.  This RN informed her per return call above is appropriate and need to use for 7 days.  If symptoms persist she should call again.  Monica [name] verbalized understanding.

## 2021-02-27 ENCOUNTER — Encounter: Payer: Self-pay | Admitting: Oncology

## 2021-02-27 ENCOUNTER — Other Ambulatory Visit: Payer: Self-pay

## 2021-02-27 ENCOUNTER — Other Ambulatory Visit: Payer: No Typology Code available for payment source

## 2021-02-27 ENCOUNTER — Ambulatory Visit
Admission: RE | Admit: 2021-02-27 | Discharge: 2021-02-27 | Disposition: A | Discharge status: Home / Self Care | Payer: 59 | Source: Ambulatory Visit | Attending: Radiation Oncology | Admitting: Radiation Oncology

## 2021-02-27 ENCOUNTER — Ambulatory Visit: Payer: No Typology Code available for payment source

## 2021-02-27 DIAGNOSIS — Z5112 Encounter for antineoplastic immunotherapy: Secondary | ICD-10-CM | POA: Diagnosis not present

## 2021-02-28 ENCOUNTER — Other Ambulatory Visit: Payer: Self-pay | Admitting: Neurological Surgery

## 2021-02-28 ENCOUNTER — Ambulatory Visit
Admission: RE | Admit: 2021-02-28 | Discharge: 2021-02-28 | Disposition: A | Discharge status: Home / Self Care | Payer: 59 | Source: Ambulatory Visit | Attending: Radiation Oncology | Admitting: Radiation Oncology

## 2021-02-28 DIAGNOSIS — Z5112 Encounter for antineoplastic immunotherapy: Secondary | ICD-10-CM | POA: Diagnosis not present

## 2021-03-03 ENCOUNTER — Other Ambulatory Visit: Payer: Self-pay

## 2021-03-03 ENCOUNTER — Other Ambulatory Visit: Payer: Self-pay | Admitting: Neurological Surgery

## 2021-03-03 ENCOUNTER — Ambulatory Visit
Admission: RE | Admit: 2021-03-03 | Discharge: 2021-03-03 | Disposition: A | Discharge status: Home / Self Care | Payer: 59 | Source: Ambulatory Visit | Attending: Radiation Oncology | Admitting: Radiation Oncology

## 2021-03-03 DIAGNOSIS — Z5112 Encounter for antineoplastic immunotherapy: Secondary | ICD-10-CM | POA: Diagnosis not present

## 2021-03-04 ENCOUNTER — Encounter: Payer: Self-pay | Admitting: Oncology

## 2021-03-04 ENCOUNTER — Inpatient Hospital Stay: Payer: 59

## 2021-03-04 ENCOUNTER — Ambulatory Visit
Admission: RE | Admit: 2021-03-04 | Discharge: 2021-03-04 | Disposition: A | Discharge status: Home / Self Care | Payer: 59 | Source: Ambulatory Visit | Attending: Radiation Oncology | Admitting: Radiation Oncology

## 2021-03-04 ENCOUNTER — Inpatient Hospital Stay (HOSPITAL_BASED_OUTPATIENT_CLINIC_OR_DEPARTMENT_OTHER): Payer: 59 | Admitting: Oncology

## 2021-03-04 ENCOUNTER — Other Ambulatory Visit: Payer: Self-pay | Admitting: Oncology

## 2021-03-04 VITALS — BP 115/77 | HR 94 | Temp 99.1°F | Resp 18 | Ht 66.0 in | Wt 162.2 lb

## 2021-03-04 DIAGNOSIS — C7931 Secondary malignant neoplasm of brain: Secondary | ICD-10-CM

## 2021-03-04 DIAGNOSIS — C50211 Malignant neoplasm of upper-inner quadrant of right female breast: Secondary | ICD-10-CM

## 2021-03-04 DIAGNOSIS — C7801 Secondary malignant neoplasm of right lung: Secondary | ICD-10-CM

## 2021-03-04 DIAGNOSIS — Z5112 Encounter for antineoplastic immunotherapy: Secondary | ICD-10-CM | POA: Diagnosis not present

## 2021-03-04 DIAGNOSIS — D496 Neoplasm of unspecified behavior of brain: Secondary | ICD-10-CM

## 2021-03-04 DIAGNOSIS — C7951 Secondary malignant neoplasm of bone: Secondary | ICD-10-CM

## 2021-03-04 DIAGNOSIS — C7989 Secondary malignant neoplasm of other specified sites: Secondary | ICD-10-CM

## 2021-03-04 DIAGNOSIS — Z170 Estrogen receptor positive status [ER+]: Secondary | ICD-10-CM

## 2021-03-04 DIAGNOSIS — C7802 Secondary malignant neoplasm of left lung: Secondary | ICD-10-CM

## 2021-03-04 DIAGNOSIS — C787 Secondary malignant neoplasm of liver and intrahepatic bile duct: Secondary | ICD-10-CM

## 2021-03-04 LAB — CBC WITH DIFFERENTIAL/PLATELET
Abs Immature Granulocytes: 0.03 10*3/uL (ref 0.00–0.07)
Basophils Absolute: 0 10*3/uL (ref 0.0–0.1)
Basophils Relative: 1 %
Eosinophils Absolute: 0.1 10*3/uL (ref 0.0–0.5)
Eosinophils Relative: 3 %
HCT: 33.5 % — ABNORMAL LOW (ref 36.0–46.0)
Hemoglobin: 10.5 g/dL — ABNORMAL LOW (ref 12.0–15.0)
Immature Granulocytes: 1 %
Lymphocytes Relative: 6 %
Lymphs Abs: 0.3 10*3/uL — ABNORMAL LOW (ref 0.7–4.0)
MCH: 29.8 pg (ref 26.0–34.0)
MCHC: 31.3 g/dL (ref 30.0–36.0)
MCV: 95.2 fL (ref 80.0–100.0)
Monocytes Absolute: 0.6 10*3/uL (ref 0.1–1.0)
Monocytes Relative: 11 %
Neutro Abs: 4.5 10*3/uL (ref 1.7–7.7)
Neutrophils Relative %: 78 %
Platelets: 255 10*3/uL (ref 150–400)
RBC: 3.52 MIL/uL — ABNORMAL LOW (ref 3.87–5.11)
RDW: 20.7 % — ABNORMAL HIGH (ref 11.5–15.5)
WBC: 5.6 10*3/uL (ref 4.0–10.5)
nRBC: 0 % (ref 0.0–0.2)

## 2021-03-04 LAB — COMPREHENSIVE METABOLIC PANEL
ALT: 31 U/L (ref 0–44)
AST: 62 U/L — ABNORMAL HIGH (ref 15–41)
Albumin: 3.7 g/dL (ref 3.5–5.0)
Alkaline Phosphatase: 264 U/L — ABNORMAL HIGH (ref 38–126)
Anion gap: 9 (ref 5–15)
BUN: 12 mg/dL (ref 6–20)
CO2: 22 mmol/L (ref 22–32)
Calcium: 9 mg/dL (ref 8.9–10.3)
Chloride: 108 mmol/L (ref 98–111)
Creatinine, Ser: 0.7 mg/dL (ref 0.44–1.00)
GFR, Estimated: >60 mL/min (ref >60)
Glucose, Bld: 103 mg/dL — ABNORMAL HIGH (ref 70–99)
Potassium: 4.1 mmol/L (ref 3.5–5.1)
Sodium: 139 mmol/L (ref 135–145)
Total Bilirubin: 0.5 mg/dL (ref 0.3–1.2)
Total Protein: 6.9 g/dL (ref 6.5–8.1)

## 2021-03-04 MED ORDER — SODIUM CHLORIDE 0.9 % IV SOLN: Freq: Once | INTRAVENOUS | Status: AC

## 2021-03-04 MED ORDER — ACETAMINOPHEN 325 MG PO TABS
650.0000 mg | ORAL_TABLET | Freq: Once | ORAL | Status: AC
  Administered 2021-03-04: 650 mg via ORAL
  Filled 2021-03-04: qty 2

## 2021-03-04 MED ORDER — TRASTUZUMAB-DKST CHEMO 150 MG IV SOLR
6.0000 mg/kg | Freq: Once | INTRAVENOUS | Status: AC
  Administered 2021-03-04: 462 mg via INTRAVENOUS
  Filled 2021-03-04: qty 22

## 2021-03-04 MED ORDER — DIPHENHYDRAMINE HCL 25 MG PO CAPS
25.0000 mg | ORAL_CAPSULE | Freq: Once | ORAL | Status: AC
Start: 2021-03-04 — End: 2021-03-04
  Administered 2021-03-04: 25 mg via ORAL
  Filled 2021-03-04: qty 1

## 2021-03-04 NOTE — Progress Notes (Addendum)
[facility]  [phone] [phone]     ID: Monica [name] DOB: 1971/05/24  MR#: [id number]  GYK#:[id number]  Patient Care Team: [name], PA-C as PCP - General (Family Medicine) [name], MD as Consulting Physician (General Surgery) Deandrea Rion, [name], MD as Consulting Physician (Oncology) [name], MD as Consulting Physician (Radiation Oncology) [name], MD as Consulting Physician (Obstetrics and Gynecology) Monica Dresser, MD as Consulting Physician (Cardiology) Janice Coffin, MD as Referring Physician (Orthopedic Surgery) Judith Part, MD as Consulting Physician (Neurosurgery) OTHER MD: [facility]   CHIEF COMPLAINT: triple positive breast cancer  CURRENT TREATMENT: tucatinib, capecitabine, trastuzumab; denosumab Delton See   INTERVAL HISTORY: Monica [name] returns today for follow up of her triple positive breast cancer accompanied by her mother.  She is currently receiving palliative radiation therapy to bilateral proximal femora under Dr. [name].  This is scheduled through 03/20/2021  She started tucatinib and capecitabine on 02/12/2021.  She is tolerating this combination remarkably well.  She has no mouth sores, no diarrhea and no rash.  She is not more fatigued than before.  She receives the trastuzumab every 21 days with a dose due today.  Her most recent echocardiogram was 01/14/2021.  She is scheduled for restaging brain MRI on 03/06/2021.  She will be presented at tumor board the following Monday.   REVIEW OF SYSTEMS: Monica [name] feels "blah" with antidepressants and wondered if she really needed the Cymbalta.  She has stopped the decibel except occasionally.  She has no nausea at present and several of her supportive medications she is taking on an as needed and not standing basis.  For pain she is using the ibuprofen/Tylenol combination with occasional tramadol.  When she uses the tramadol she does get  constipated but knows to use the MiraLAX and Senokot.  She is using Diflucan for thrush and that has not entirely cleared so she will continue it.  She has a rare headache, and her eyes are "crappy", blurry, with some trouble reading, but there is no progressive symptomatology there.  Detailed review of systems was otherwise stable.   COVID 19 VACCINATION STATUS: Status post Moderna x2, no booster as of December 2021   HISTORY OF CURRENT ILLNESS: From the original intake note:   Elane Chesmore (pronounced "uric") palpated a right supraclavicular mass and felt tenderness after stretching one day. She brought this to her PA's attention.  The patient underwent bilateral diagnostic mammography with tomography and right breast ultrasonography at [facility] on 12/20/2017 showing: breast density category C. There was a suspicious palpable mass at the 1 o'clock position upper inner quadrant measuring 1.4 x 1.3 x 1.3 cm located 12 cm from the nipple. There was an additional mass at the 8 o'clock radiant measuring 1.2 x 1.0 x 1.4 cm, which likely represented a cyst. Ultrasound evaluation of the right axilla demonstrates no evidence of lymphadenopathy   Accordingly on 12/29/2017 she proceeded to biopsy of the right breast area in question. The pathology from this procedure showed ([id number]): Invasive ductal carcinoma, grade III. Prognostic indicators significant for: estrogen receptor, 80% positive and progesterone receptor, 70% positive, both with strong staining intensity. Proliferation marker Ki67 at 80%. HER2 amplified with ratios HER2/CEP17 signals 3.44 and average HER2 copies per cell 10.65.   The patient's subsequent history is as detailed below.   PAST MEDICAL HISTORY: Past Medical History:  Diagnosis Date   Arthritis    lower back   Cancer [facility])    right breast  cancer   Family history of breast cancer    Personal history of chemotherapy    Personal history of radiation therapy   She  notes that she had a heart murmur as a child, which she grew out of.    PAST SURGICAL HISTORY: Past Surgical History:  Procedure Laterality Date   ABDOMINAL HYSTERECTOMY     APPLICATION OF CRANIAL NAVIGATION N/A 12/06/2020   Procedure: APPLICATION OF CRANIAL NAVIGATION;  Surgeon: Judith Part, MD;  Location: [place];  Service: Neurosurgery;  Laterality: N/A;  RM 20   BREAST LUMPECTOMY Right    BREAST LUMPECTOMY WITH RADIOACTIVE SEED AND SENTINEL LYMPH NODE BIOPSY Right 02/16/2018   Procedure: RIGHT BREAST LUMPECTOMY WITH RADIOACTIVE SEED AND SENTINEL LYMPH NODE BIOPSY;  Surgeon: [name], MD;  Location: [place];  Service: General;  Laterality: Right;   CRANIOTOMY Right 12/06/2020   Procedure: Right sided awake craniotomy for tumor resection;  Surgeon: Judith Part, MD;  Location: [place];  Service: Neurosurgery;  Laterality: Right;   HYMENECTOMY     IR US GUIDE BX ASP/DRAIN  12/02/2020   PORTACATH PLACEMENT Right 02/16/2018   Procedure: INSERTION PORT-A-CATH;  Surgeon: [name], MD;  Location: [place];  Service: General;  Laterality: Right;   RE-EXCISION OF BREAST LUMPECTOMY Right 02/22/2018   Procedure: RE-EXCISION OF RIGHT  BREAST LUMPECTOMY;  Surgeon: [name], MD;  Location: [place];  Service: General;  Laterality: Right;   REPAIR VAGINAL CUFF N/A 02/07/2017   Procedure: REPAIR VAGINAL CUFF;  Surgeon: Ena Dawley, MD;  Location: [facility];  Service: Gynecology;  Laterality: N/A;   ROBOTIC ASSISTED TOTAL HYSTERECTOMY WITH SALPINGECTOMY Left 01/20/2017   Procedure: ROBOTIC ASSISTED TOTAL HYSTERECTOMY WITH SALPINGECTOMY;  Surgeon: [name], MD;  Location: [facility];  Service: Gynecology;  Laterality: Left;  Partial Hysterectomy without BSO   FAMILY HISTORY Family History  Problem Relation Age of Onset   Lung cancer Maternal Grandfather    Esophageal cancer Paternal Grandfather 19   Breast cancer Cousin 71   As of July 2019, the patient's father is alive at 39. The patient's mother is also alive at 18. The patient has 1 brother and 5 sisters. There was a maternal grandfather diagnosed with lung cancer at 32. There was a paternal grandfather diagnosed with esophageal cancer at 20. The patient's father had a pre-cancerous esophageal finding at age 38. There was also a maternal 1st cousin diagnosed with metastatic breast cancer at age 66.    GYNECOLOGIC HISTORY:  Patient's last menstrual period was 12/21/2016 (exact date). Menarche: 50 years old Age at first live birth: 50 years old She is GXP2. She is status post partial hysterectomy without BSO in 2018 She never used HRT. She used oral contraception over 21 years ago with no complications.   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 The patient is separated from her husband, [name]. The patient's son [name] is in the [place] [employer] and is stationed in [place] (for 3 years beginning January 2020). He plans on working in the railroad industry after he returns. The patient's daughter [name], age 1, works at Engineer, maintenance    [place]: Not in place; at the 06/20/2020 visit the patient was given the appropriate documents to complete and notarized at her discretion   HEALTH MAINTENANCE: Social History   Tobacco Use   Smoking status: Never   Smokeless tobacco: Never  Vaping Use  Vaping Use: Some days  Substance Use Topics   Alcohol use: Yes    Comment: occ   Drug use: No     Colonoscopy:   PAP: 2018/ prior to hysterectomy  Bone density:   No Known Allergies  Current Outpatient Medications  Medication Sig Dispense Refill   acetaminophen (TYLENOL) 500 MG tablet Take 1 tablet (500 mg total) by mouth 3 (three) times daily as needed for mild pain (or Fever >/= 101). Take with ibuprofen 400 mg     b complex vitamins capsule Take 1 capsule by mouth daily.  900 mg     capecitabine (XELODA) 500 MG tablet Take 1,500 mg by mouth 3 (three) times daily.     Cholecalciferol (VITAMIN D) 125 MCG (5000 UT) CAPS Take 5,000 Units by mouth daily.     DULoxetine (CYMBALTA) 20 MG capsule TAKE 1 CAPSULE BY MOUTH EVERY DAY 90 capsule 2   ferrous sulfate 325 (65 FE) MG tablet Take 325 mg by mouth daily with breakfast.     fluconazole (DIFLUCAN) 100 MG tablet Take 100 mg by mouth daily.     gabapentin (NEURONTIN) 300 MG capsule Take 1 capsule (300 mg total) by mouth 3 (three) times daily. 90 capsule 6   ibuprofen (ADVIL) 400 MG tablet Take 1 tablet (400 mg total) by mouth 3 (three) times daily. Take with tylenol 500 mg (Patient taking differently: Take 400 mg by mouth 3 (three) times daily as needed for mild pain or moderate pain.) 90 tablet 3   lidocaine (LIDODERM) 5 % Place 1 patch onto the skin daily as needed (for pain- Remove & Discard patch within 12 hours or as directed by MD).     ondansetron (ZOFRAN ODT) 4 MG disintegrating tablet Take 1 tablet (4 mg total) by mouth See admin instructions. Dissolve 4 mg in the mouth three times a day and an additional 4 mg at bedtime as needed for nausea/vomiting     pantoprazole (PROTONIX) 40 MG tablet Take 40 mg by mouth daily.     polyethylene glycol (MIRALAX / GLYCOLAX) 17 g packet Take 17 g by mouth daily as needed for moderate constipation. 14 each 0   prochlorperazine (COMPAZINE) 10 MG tablet Take 1 tablet (10 mg total) by mouth every 6 (six) hours. (Patient taking differently: Take 10 mg by mouth 3 (three) times daily.) 30 tablet 3   pyridOXINE (VITAMIN B-6) 100 MG tablet Take 100 mg by mouth daily.     senna-docusate (SENOKOT-S) 8.6-50 MG tablet Take 2 tablets by mouth 2 (two) times daily.     traMADol (ULTRAM) 50 MG tablet Take 1 tablet (50 mg total) by mouth every 6 (six) hours as needed. 120 tablet 0   traZODone (DESYREL) 50 MG tablet Take 1 tablet (50 mg total) by mouth at bedtime as needed for sleep. (Patient not  taking: Reported on 02/28/2021)     TUKYSA 150 MG tablet Take 150 mg by mouth 2 (two) times daily.     No current facility-administered medications for this visit.   Facility-Administered Medications Ordered in Other Visits  Medication Dose Route Frequency Provider Last Rate Last Admin   sodium chloride flush (NS) 0.9 % injection 10 mL  10 mL Intracatheter Once Lakena Sparlin, [name], MD        OBJECTIVE: White woman examined in a wheelchair  Vitals:   03/04/21 1102  BP: 115/77  Pulse: 94  Resp: 18  Temp: 99.1 F (37.3 C)  SpO2: 99%  Body mass index is 26.18 kg/m.   Wt Readings from Last 3 Encounters:  03/04/21 162 lb 3.2 oz (73.6 kg)  01/14/21 171 lb 15.3 oz (78 kg)  01/09/21 171 lb (77.6 kg)  ECOG FS:1  Sclerae unicteric, pupils round and equal, EOMs intact Wearing a mask No cervical or supraclavicular adenopathy Lungs no rales or rhonchi Heart regular rate and rhythm Abd soft, nontender, positive bowel sounds MSK no focal spinal tenderness, no upper extremity lymphedema Neuro: Hip flexion 4/5 bilaterally, grip 5 - bilaterally well oriented, positive affect Breasts: Deferred   LAB RESULTS:  CMP     Component Value Date/Time   NA 139 03/04/2021 1037   K 4.1 03/04/2021 1037   CL 108 03/04/2021 1037   CO2 22 03/04/2021 1037   GLUCOSE 103 (H) 03/04/2021 1037   BUN 12 03/04/2021 1037   CREATININE 0.70 03/04/2021 1037   CREATININE 0.81 06/20/2020 0839   CALCIUM 9.0 03/04/2021 1037   PROT 6.9 03/04/2021 1037   ALBUMIN 3.7 03/04/2021 1037   AST 62 (H) 03/04/2021 1037   AST 19 06/20/2020 0839   ALT 31 03/04/2021 1037   ALT 14 06/20/2020 0839   ALKPHOS 264 (H) 03/04/2021 1037   BILITOT 0.5 03/04/2021 1037   BILITOT 0.5 06/20/2020 0839   GFRNONAA >60 03/04/2021 1037   GFRNONAA >60 06/20/2020 0839   GFRAA >60 06/13/2019 1138    Lab Results  Component Value Date   WBC 5.6 03/04/2021   NEUTROABS 4.5 03/04/2021   HGB 10.5 (L) 03/04/2021   HCT 33.5 (L)  03/04/2021   MCV 95.2 03/04/2021   PLT 255 03/04/2021    No results found for: LABCA2  No components found for: [id number]  No results for input(s): INR in the last 168 hours.  No results found for: LABCA2  No results found for: CAN199  Lab Results  Component Value Date   CAN125 525.0 (H) 02/10/2021    Lab Results  Component Value Date   CAN153 517.0 (H) 02/10/2021    Lab Results  Component Value Date   CA2729 521.9 (H) 02/10/2021    No components found for: HGQUANT  No results found for: CEA1 / No results found for: CEA1   No results found for: AFPTUMOR  No results found for: CHROMOGRNA  No results found for: TOTALPROTELP, ALBUMINELP, A1GS, A2GS, BETS, BETA2SER, GAMS, MSPIKE, SPEI (this displays SPEP labs)  No results found for: KPAFRELGTCHN, LAMBDASER, KAPLAMBRATIO (kappa/lambda light chains)  No results found for: HGBA, HGBA2QUANT, HGBFQUANT, HGBSQUAN (Hemoglobinopathy evaluation)   No results found for: LDH  Lab Results  Component Value Date   IRON 69 01/16/2021   TIBC 361 01/16/2021   IRONPCTSAT 19 01/16/2021   (Iron and TIBC)  Lab Results  Component Value Date   FERRITIN 2,032 (H) 01/16/2021    Urinalysis    Component Value Date/Time   COLORURINE YELLOW 11/30/2020 1315   APPEARANCEUR HAZY (A) 11/30/2020 1315   LABSPEC 1.009 11/30/2020 1315   PHURINE 5.0 11/30/2020 [place] 11/30/2020 [place] 11/30/2020 [place] 11/30/2020 [place] 11/30/2020 1315   PROTEINUR NEGATIVE 11/30/2020 1315   NITRITE NEGATIVE 11/30/2020 1315   LEUKOCYTESUR TRACE (A) 11/30/2020 1315    STUDIES: No results found.    ELIGIBLE FOR AVAILABLE RESEARCH PROTOCOL: BCEP  ASSESSMENT: 50 y.o.  [place], [place] woman status post right breast upper inner quadrant biopsy 12/29/2017, for a clinical T1c N0, stage IA invasive ductal  carcinoma, triple positive, with an MIB-1 of 80%.  (1) genetics testing  02/02/2018 though the CancerNext gene panel offered by [facility] showed no deleterious mutations in  APC, ATM, BARD1, BMPR1A,BRCA1, BRCA2, BRIP1, CDH1, CDK4, CDKN2A, CHEK2, DICER1, HOXB13, MLH1, MRE11A, MSH2, MSH6, MUTYH, NBN, NF1, PALB2, PMS2, POLD1, POLE, PTEN, RAD50, RAD51C, RAD51D, SMAD4, SMARCA4, STK11 and TP53 (sequencing and deletion/duplication); EPCAM and GREM1 (deletion/duplication only).  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>A) )   (2) right lumpectomy and sentinel lymph node sampling 02/16/2018 showed a pT2 pN0, stage IB invasive ductal carcinoma, grade 3, with a positive inferior margin; a total of 5 lymph nodes were removed  (a) additional surgery 02/27/2018 cleared the margins  (3) started carboplatin, docetaxel, trastuzumab and pertuzumab 03/11/2018, repeated every 21 days x 6, last dose 07/18/2018.    (a) Pertuzumab omitted after cycle 1 due to diarrhea.  (b) Gemcitabine substituted for docetaxel starting with cycle 5 due to peripheral neuropathy  (4) continued trastuzumab to total 6 months (through February 2020).  (a) echocardiogram 01/21/2018 showed an ejection fraction in the 55-60% range  (b) repeat echocardiogram 06/14/2018 showed an ejection fraction in the 60-65%  (5) adjuvant radiation 08/15/2018 - 09/28/2018  Site/dose:   The patient initially received a dose of 50.4 Gy in 28 fractions to the right breast using whole-breast tangent fields. This was delivered using a 3-D conformal technique. The patient then received a boost to the seroma. This delivered an additional 10 Gy in 5 fractions using 12E, 9E electrons with a special teletherapy technique. The total dose was 60.4 Gy.   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, involving  brain, liver, lungs, and bones (7) presenting 11/29/2020 with progressive back pain:             (a) non-contrast CT abd/pelvis (renal stone study) 11/29/2020 shows bone metastases             (b) CT chest/abd/pelvis with contrast 11/30/2020 shows multiple large liver masses, multiple pulmonary nodules, and widespread lytic bone lesions             (c) MRI brain shows at least 4 brain lesions, largest 3.8 cm             (d) total spinal MRI 11/30/2020 shows pathologic fractures at T9, T12, possibly L2, as well as numerous other spine lesions, but no epidural enhancement or cord compression             (e) liver biopsy 12/02/2020 shows adenocarcinoma, prognostic panel again triple positive             (f) pre-op repeat MRI 12/01/2020 shows additional brain lesions, at least 8 of which will be targets for SRS   (8) CNS treatment:             (a) [facility] 12/05/2020             (b) surgery 12/06/2020 confirmed metastatic carcinoma, estrogen receptor positive, HER2 amplified, progesterone receptor negative, with an MIB-1 of 70%.   (9) XRT to spine 12/05/2020 through 12/23/2020 Site Technique Total Dose (Gy) Dose per Fx (Gy) Completed Fx Beam Energies  Thoracic Spine: Spine_T9-L3 Complex 30/30 3 10/10 15X  Pelvis: Pelvis_sacrum Complex 30/30 3 10/10 15X   (10) advanced directives-- plans to  name son as Chauncey Reading with her father as secondary   (70) started capecitabine 01/08/2021, trastuzumab 01/20/2021, tucatinib 02/10/2021  (12) to start denosumab/Xgeva 01/15/2021, repeated every 6 weeks  (A) received one dose pamidronate in hospital 01/16/2021  (13) pain management:  (a) Tylenol 500 mg and Advil 400 mg 3 times a day with food  (b) gabapentin 3 times a day as needed  (c) tramadol 50 mg 4 times a day as needed if above not adequate  (d) bowel prophylaxis: MiraLAX daily, stool softeners 2 tablets twice daily   PLAN: Monica [name] is scheduled for kyphoplasty on 03/07/2021 and I am hopeful her pain will  decrease even further after that procedure.  At this point however her pain is generally well controlled and she is able to walk around the apartment with a walker.  There have been no falls.  She can do about 10 minutes maximum she says on a walk.  She is scheduled for brain MRI and will be represented at tumor board.  I do not see an appointment with Dr. Mickeal Skinner after that and we will check to see whether he is planning to follow her or whether he would like [place] to interpret the MRI to her (I find it difficult to get to the tumor board so will not be able to participate in that discussion).  She is tolerating her tucatinib and venlafaxine well.  If the central nervous system disease is well controlled, once she has undergone the kyphoplasty and completed her radiation I will set her up for restaging studies.  Total encounter time 35 minutes.Sarajane Jews C. Nyja Westbrook, MD  03/04/21 6:49 PM Medical Oncology and Hematology [address] [place], [place] [address] Tel. [phone]    Fax. [phone]   I, Wilburn Mylar, am acting as scribe for Dr. [name]. Zaeda Mcferran.  I, Lurline Del MD, have reviewed the above documentation for accuracy and completeness, and I agree with the above.   *Total Encounter Time as defined by the Centers for Medicare and Medicaid Services includes, in addition to the face-to-face time of a patient visit (documented in the note above) non-face-to-face time: obtaining and reviewing outside history, ordering and reviewing medications, tests or procedures, care coordination (communications with other health care professionals or caregivers) and documentation in the medical record.

## 2021-03-04 NOTE — Patient Instructions (Signed)
[facility]ICAL [facility]COLOG[facility]  Discharge Instructions: Thank you for choosing [facility] to provide your oncology and hematology care.   If you have a lab appointment with the Cancer Center, please go directly to the Cancer Center and check in at the registration area.   Wear comfortable clothing and clothing appropriate for easy access to any Portacath or PICC line.   We strive to give you quality time with your provider. You may need to reschedule your appointment if you arrive late (15 or more minutes).  Arriving late affects you and other patients whose appointments are after yours.  Also, if you miss three or more appointments without notifying the office, you may be dismissed from the clinic at the provider's discretion.      For prescription refill requests, have your pharmacy contact our office and allow 72 hours for refills to be completed.    Today you received the following chemotherapy and/or immunotherapy agents: Ogivri      To help prevent nausea and vomiting after your treatment, we encourage you to take your nausea medication as directed.  BELOW ARE SYMPTOMS THAT SHOULD BE REPORTED IMMEDIATELY: *FEVER GREATER THAN 100.4 F (38 C) OR HIGHER *CHILLS OR SWEATING *NAUSEA AND VOMITING THAT IS NOT CONTROLLED WITH YOUR NAUSEA MEDICATION *UNUSUAL SHORTNESS OF BREATH *UNUSUAL BRUISING OR BLEEDING *URINARY PROBLEMS (pain or burning when urinating, or frequent urination) *BOWEL PROBLEMS (unusual diarrhea, constipation, pain near the anus) TENDERNESS IN MOUTH AND THROAT WITH OR WITHOUT PRESENCE OF ULCERS (sore throat, sores in mouth, or a toothache) UNUSUAL RASH, SWELLING OR PAIN  UNUSUAL VAGINAL DISCHARGE OR ITCHING   Items with * indicate a potential emergency and should be followed up as soon as possible or go to the Emergency Department if any problems should occur.  Please show the CHEMOTHERAPY ALERT CARD or IMMUNOTHERAPY ALERT CARD at check-in to the  Emergency Department and triage nurse.  Should you have questions after your visit or need to cancel or reschedule your appointment, please contact [facility]  Dept: [phone]  and follow the prompts.  Office hours are 8:00 a.m. to 4:30 p.m. Monday - Friday. Please note that voicemails left after 4:00 p.m. may not be returned until the following business day.  We are closed weekends and major holidays. You have access to a nurse at all times for urgent questions. Please call the main number to the clinic Dept: [phone] and follow the prompts.   For any non-urgent questions, you may also contact your provider using MyChart. We now offer e-Visits for anyone 18 and older to request care online for non-urgent symptoms. For details visit [email or web address].   Also download the MyChart app! Go to the app store, search "MyChart", open the app, select [facility], and log in with your MyChart username and password.  Due to Covid, a mask is required upon entering the hospital/clinic. If you do not have a mask, one will be given to you upon arrival. For doctor visits, patients may have 1 support person aged 18 or older with them. For treatment visits, patients cannot have anyone with them due to current Covid guidelines and our immunocompromised population.   

## 2021-03-04 NOTE — Research (Signed)
Upbeat 3 years: Patient completed the PROs for the 3 year time point on the Upbeat Study while she was in the clinic for chemo treatment.  Patient preferred to complete them on paper instead of online.  I thanked the patient for her continued participation in this study. [place] Specialist 03/04/21

## 2021-03-04 NOTE — Pre-Procedure Instructions (Signed)
Surgical Instructions    Your procedure is scheduled on Friday, August 26th.  Report to [facility] Main Entrance "A" at 5:30 A.M., then check in with the Admitting office.  Call this number if you have problems the morning of surgery:  [phone]   If you have any questions prior to your surgery date call [phone]: Open Monday-Friday 8am-4pm    Remember:  Do not eat after midnight the night before your surgery  You may drink clear liquids until 4:30 a.m. the morning of your surgery.   Clear liquids allowed are: Water, Non-Citrus Juices (without pulp), Carbonated Beverages, Clear Tea, Black Coffee Only, and Gatorade.    Take these medicines the morning of surgery with A SIP OF WATER  DULoxetine (CYMBALTA)  fluconazole (DIFLUCAN)  gabapentin (NEURONTIN)  pantoprazole (PROTONIX) prochlorperazine (COMPAZINE) TUKYSA capecitabine (XELODA)    Take these medications as needed: acetaminophen (TYLENOL) ondansetron (ZOFRAN ODT) traMADol (ULTRAM)  As of today, STOP taking any Aspirin (unless otherwise instructed by your surgeon) Aleve, Naproxen, Ibuprofen, Motrin, Advil, Goody's, BC's, all herbal medications, fish oil, and all vitamins.                     Do NOT Smoke (Tobacco/Vaping) or drink Alcohol 24 hours prior to your procedure.  If you use a CPAP at night, you may bring all equipment for your overnight stay.   Contacts, glasses, piercing's, hearing aid's, dentures or partials may not be worn into surgery, please bring cases for these belongings.    For patients admitted to the hospital, discharge time will be determined by your treatment team.   Patients discharged the day of surgery will not be allowed to drive home, and someone needs to stay with them for 24 hours.  ONLY 1 SUPPORT PERSON MAY BE PRESENT WHILE YOU ARE IN SURGERY. IF YOU ARE TO BE ADMITTED ONCE YOU ARE IN YOUR ROOM YOU WILL BE ALLOWED TWO (2) VISITORS.  Minor children may have two parents present. Special  consideration for safety and communication needs will be reviewed on a case by case basis.   Special instructions:   [place]- Preparing For Surgery  Before surgery, you can play an important role. Because skin is not sterile, your skin needs to be as free of germs as possible. You can reduce the number of germs on your skin by washing with CHG (chlorahexidine gluconate) Soap before surgery.  CHG is an antiseptic cleaner which kills germs and bonds with the skin to continue killing germs even after washing.    Oral Hygiene is also important to reduce your risk of infection.  Remember - BRUSH YOUR TEETH THE MORNING OF SURGERY WITH YOUR REGULAR TOOTHPASTE  Please do not use if you have an allergy to CHG or antibacterial soaps. If your skin becomes reddened/irritated stop using the CHG.  Do not shave (including legs and underarms) for at least 48 hours prior to first CHG shower. It is OK to shave your face.  Please follow these instructions carefully.   Shower the NIGHT BEFORE SURGERY and the MORNING OF SURGERY  If you chose to wash your hair, wash your hair first as usual with your normal shampoo.  After you shampoo, rinse your hair and body thoroughly to remove the shampoo.  Use CHG Soap as you would any other liquid soap. You can apply CHG directly to the skin and wash gently with a scrungie or a clean washcloth.   Apply the CHG Soap to your body  ONLY FROM THE NECK DOWN.  Do not use on open wounds or open sores. Avoid contact with your eyes, ears, mouth and genitals (private parts). Wash Face and genitals (private parts)  with your normal soap.   Wash thoroughly, paying special attention to the area where your surgery will be performed.  Thoroughly rinse your body with warm water from the neck down.  DO NOT shower/wash with your normal soap after using and rinsing off the CHG Soap.  Pat yourself dry with a CLEAN TOWEL.  Wear CLEAN PAJAMAS to bed the night before surgery  Place  CLEAN SHEETS on your bed the night before your surgery  DO NOT SLEEP WITH PETS.   Day of Surgery: Shower with CHG soap. Do not wear jewelry, make up, nail polish, gel polish, artificial nails, or any other type of covering on natural nails including finger and toenails. If patients have artificial nails, gel coating, etc. that need to be removed by a nail salon please have this removed prior to surgery. Surgery may need to be canceled/delayed if the surgeon/ anesthesia feels like the patient is unable to be adequately monitored. Do not wear lotions, powders, perfumes, or deodorant. Do not shave 48 hours prior to surgery.  Do not bring valuables to the hospital. [facility] is not responsible for any belongings or valuables. Wear Clean/Comfortable clothing the morning of surgery Remember to brush your teeth WITH YOUR REGULAR TOOTHPASTE.   Please read over the following fact sheets that you were given.

## 2021-03-05 ENCOUNTER — Encounter (HOSPITAL_COMMUNITY)
Admission: RE | Admit: 2021-03-05 | Discharge: 2021-03-05 | Disposition: A | Discharge status: Home / Self Care | Payer: 59 | Source: Ambulatory Visit | Attending: Neurological Surgery | Admitting: Neurological Surgery

## 2021-03-05 ENCOUNTER — Ambulatory Visit
Admission: RE | Admit: 2021-03-05 | Discharge: 2021-03-05 | Disposition: A | Discharge status: Home / Self Care | Payer: 59 | Source: Ambulatory Visit | Attending: Radiation Oncology | Admitting: Radiation Oncology

## 2021-03-05 ENCOUNTER — Encounter (HOSPITAL_COMMUNITY): Payer: Self-pay

## 2021-03-05 ENCOUNTER — Other Ambulatory Visit: Payer: Self-pay

## 2021-03-05 DIAGNOSIS — Z01812 Encounter for preprocedural laboratory examination: Secondary | ICD-10-CM | POA: Insufficient documentation

## 2021-03-05 DIAGNOSIS — Z5112 Encounter for antineoplastic immunotherapy: Secondary | ICD-10-CM | POA: Diagnosis not present

## 2021-03-05 DIAGNOSIS — Z20822 Contact with and (suspected) exposure to covid-19: Secondary | ICD-10-CM | POA: Insufficient documentation

## 2021-03-05 LAB — CANCER ANTIGEN 27.29: CA 27.29: 373.8 U/mL — ABNORMAL HIGH (ref 0.0–38.6)

## 2021-03-05 LAB — SARS CORONAVIRUS 2 (TAT 6-24 HRS): SARS Coronavirus 2: NEGATIVE

## 2021-03-05 LAB — CANCER ANTIGEN 15-3: CA 15-3: 353 U/mL — ABNORMAL HIGH (ref 0.0–25.0)

## 2021-03-05 LAB — CA 125: Cancer Antigen (CA) 125: 288 U/mL — ABNORMAL HIGH (ref 0.0–38.1)

## 2021-03-05 LAB — SURGICAL PCR SCREEN
MRSA, PCR: NEGATIVE
Staphylococcus aureus: NEGATIVE

## 2021-03-05 NOTE — Progress Notes (Signed)
PCP - [name], PA-C Cardiologist - denies  PPM/ICD - denies  Chest x-ray - 12/28/20 EKG - 12/30/20 Stress Test - denies ECHO - 01/14/21 Cardiac Cath - denies  Sleep Study - pt states she had one 25 years ago, no CPAP needed. Per pt, provider just recommended lifestyle changes CPAP - n/a  DM: denies  ASA/Blood Thinner Instructions: n/a   ERAS Protcol - yes, no drink   COVID TEST- pt swabbed at PAT appt 03/05/21, results pending   Anesthesia review: No  Patient denies shortness of breath, fever, cough and chest pain at PAT appointment   All instructions explained to the patient, with a verbal understanding of the material. Patient agrees to go over the instructions while at home for a better understanding. The opportunity to ask questions was provided.

## 2021-03-06 ENCOUNTER — Ambulatory Visit
Admission: RE | Admit: 2021-03-06 | Discharge: 2021-03-06 | Disposition: A | Discharge status: Home / Self Care | Payer: 59 | Source: Ambulatory Visit | Attending: Radiation Oncology | Admitting: Radiation Oncology

## 2021-03-06 ENCOUNTER — Ambulatory Visit (HOSPITAL_COMMUNITY)
Admission: RE | Admit: 2021-03-06 | Discharge: 2021-03-06 | Disposition: A | Discharge status: Home / Self Care | Payer: 59 | Source: Ambulatory Visit | Attending: Radiation Oncology | Admitting: Radiation Oncology

## 2021-03-06 ENCOUNTER — Telehealth: Payer: Self-pay | Admitting: *Deleted

## 2021-03-06 ENCOUNTER — Telehealth: Payer: Self-pay | Admitting: Oncology

## 2021-03-06 DIAGNOSIS — C7931 Secondary malignant neoplasm of brain: Secondary | ICD-10-CM

## 2021-03-06 DIAGNOSIS — C7949 Secondary malignant neoplasm of other parts of nervous system: Secondary | ICD-10-CM | POA: Insufficient documentation

## 2021-03-06 DIAGNOSIS — Z5112 Encounter for antineoplastic immunotherapy: Secondary | ICD-10-CM | POA: Diagnosis not present

## 2021-03-06 IMAGING — MR MR HEAD WO/W CM
10 of 14 series · 21 of 48 positions shown · IV contrast (gadavist)
Comparison: [DATE], [DATE]

CLINICAL DATA: Neoplasm surveillance

EXAM:
MRI HEAD WITHOUT AND WITH CONTRAST
TECHNIQUE: Multiplanar, multiecho pulse sequences of the brain and surrounding
structures were obtained without and with intravenous contrast.
CONTRAST:  6.5mL GADAVIST GADOBUTROL 1 MMOL/ML IV SOLN

[Series 2: FLAIR · sagittal · 3.0mm · 0.47mm/px · 2 of 44 slices shown (1 of 2)]
[im 1/44]
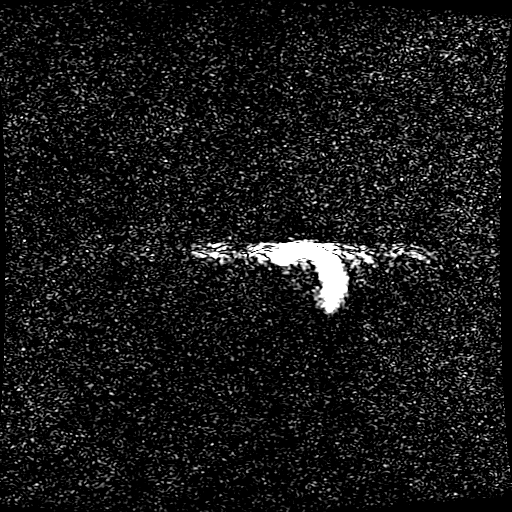
[im 44/44]
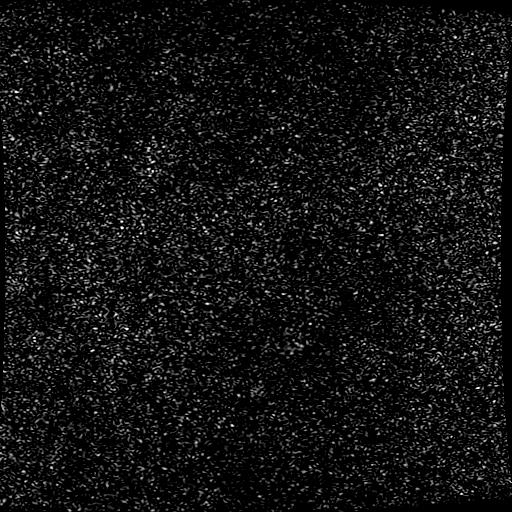

[Series 3: DWI · axial · 3.0mm · 0.94mm/px · z∈[-95,+75]mm · 4 of 116 slices shown]
[im 1/116]
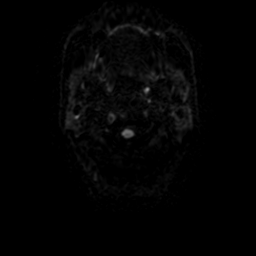
[im 39/116]
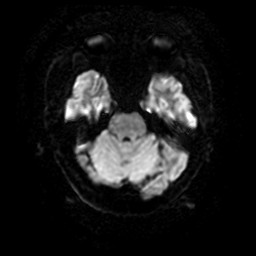
[im 77/116]
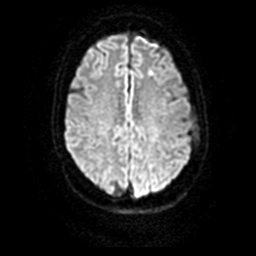
[im 116/116]
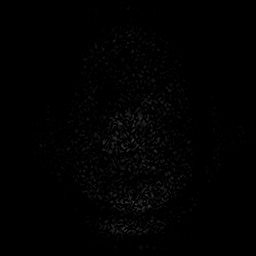

[Series 4: FLAIR · axial · 3.0mm · 0.47mm/px · 1 of 55 slices shown (2 of 2)]
[im 1/55]
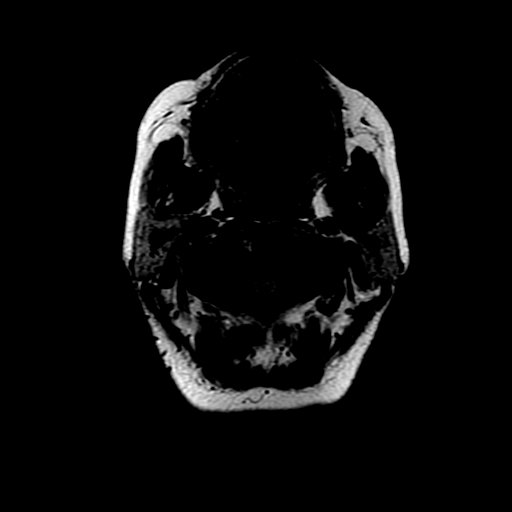

[Series 5: SWI · axial · 3.0mm · 0.47mm/px · z∈[-95,-10]mm · 2 of 116 slices shown]
[im 1/116]
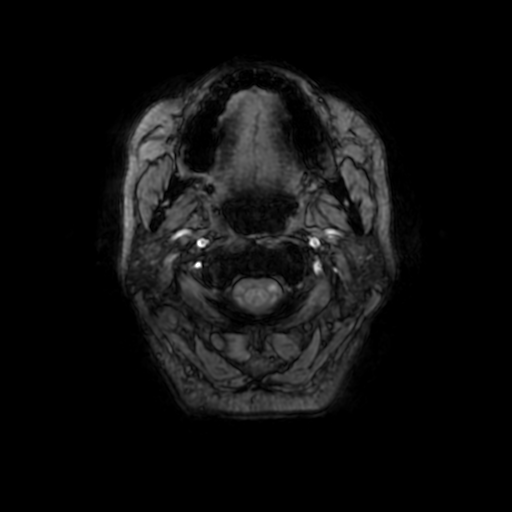
[im 58/116]
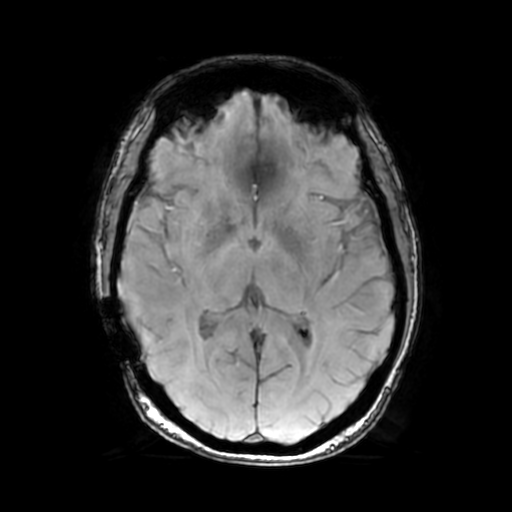

[Series 7: T2 post-contrast · coronal · 3.0mm · 0.39mm/px · 1 of 47 slices shown (1 of 2)]
[im 1/47]
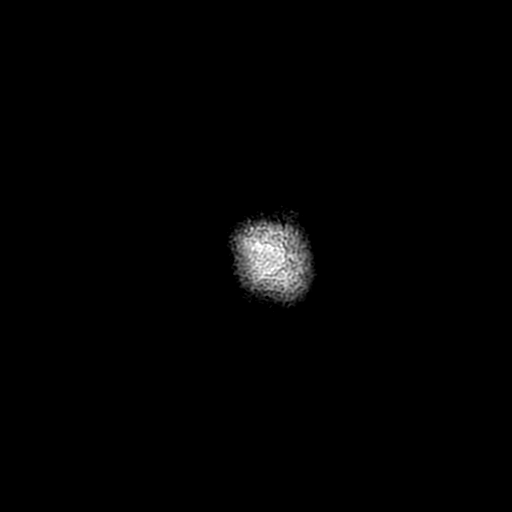

[Series 8: T2 post-contrast · axial · 5.0mm · 0.47mm/px · 1 of 31 slices shown (2 of 2)]
[im 1/31]
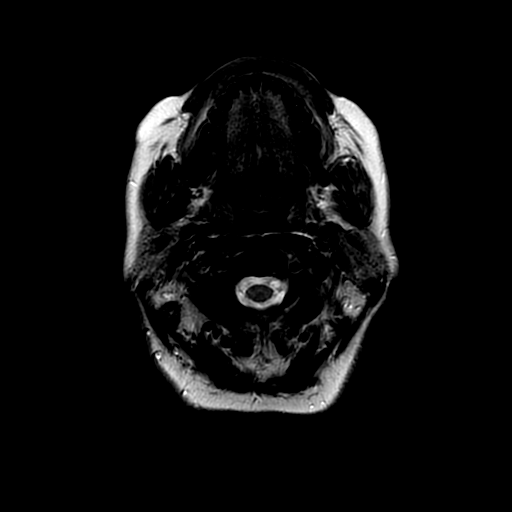

[Series 9: T1 post-contrast · coronal · 3.0mm · 0.39mm/px · 1 of 47 slices shown (1 of 2)]
[im 1/47]
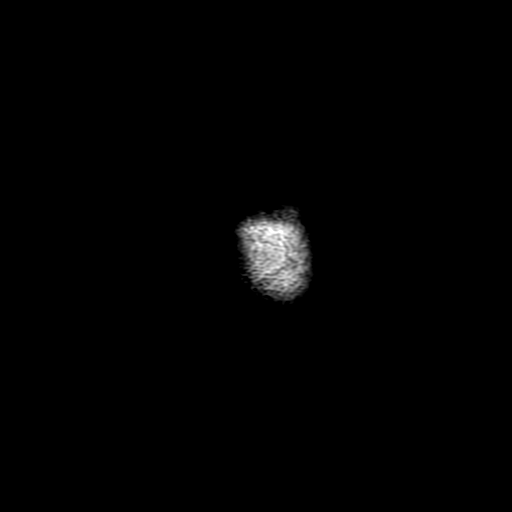

[Series 10: FLAIR post-contrast · sagittal · 3.0mm · 0.47mm/px · 1 of 44 slices shown]
[im 1/44]
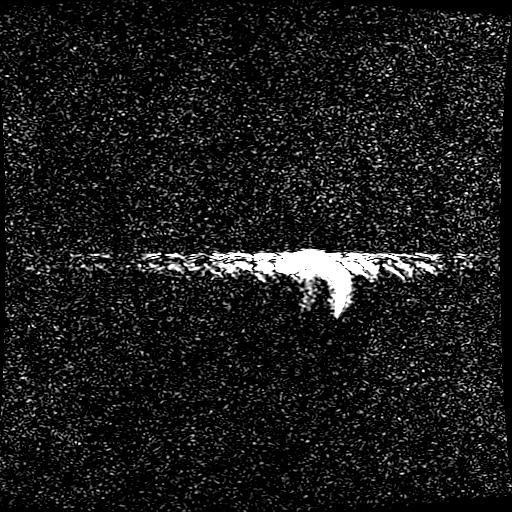

[Series 350: ADC · axial · 3.0mm · 0.94mm/px · 1 of 58 slices shown]
[im 1/58]
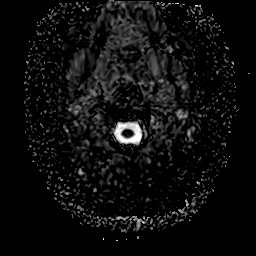

[Series 1100: T1 post-contrast · axial · 0.9mm · 0.50mm/px · z∈[-171,+84]mm · 7 of 296 slices shown (2 of 2)]
[im 1/296]
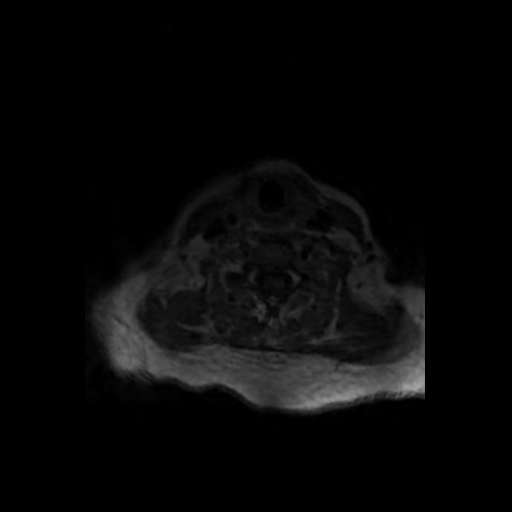
[im 50/296]
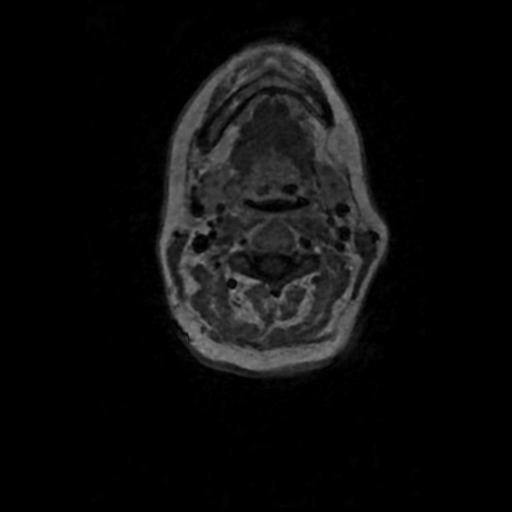
[im 99/296]
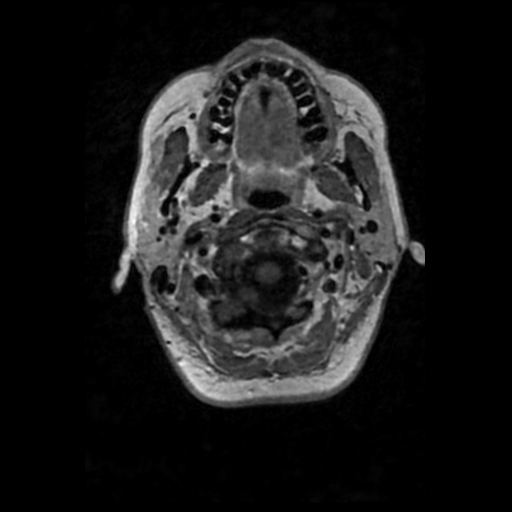
[im 148/296]
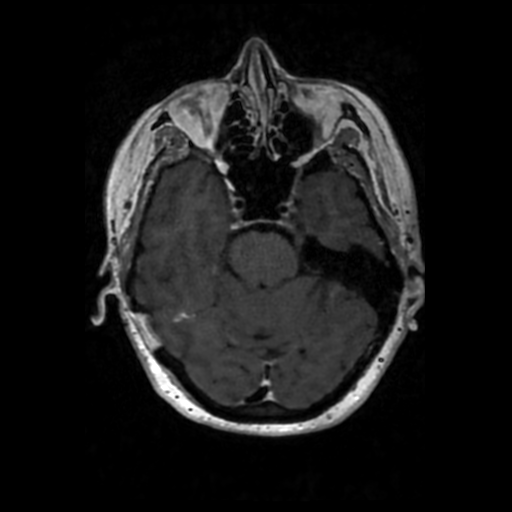
[im 197/296]
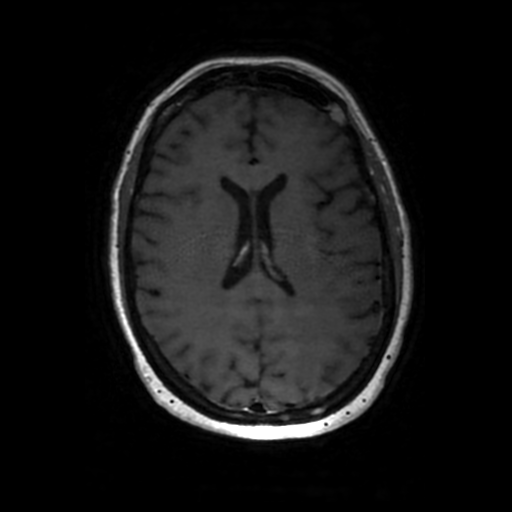
[im 246/296]
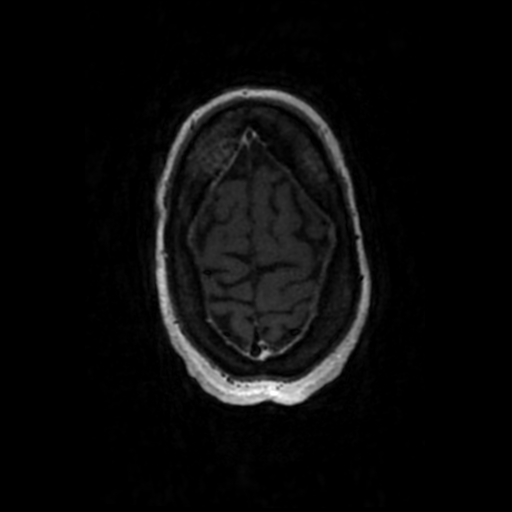
[im 296/296]
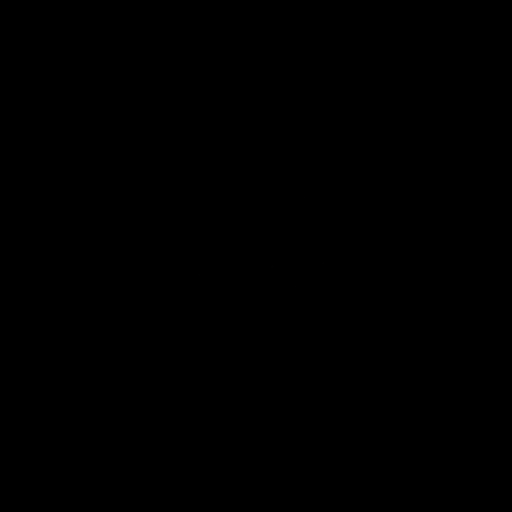

[21 of 48 positions shown; findings below may reference images not displayed]

FINDINGS: Brain: Status post right temporoparietal craniotomy with subjacent
resection cavity, which has collapsed since the prior exam. Minimal
enhancement is noted along the resection margins. Interval decrease
in surrounding T2 hyperintense signal.

Additional enhancing lesions, with comparison to [DATE]:

- [DATE] mm lesion in the anterior left frontal lobe (series [KL], image
231), previously 4 mm.

- 6 mm enhancing lesion in the posterior right frontal lobe (series
[KL], image 244), previously 6 mm.

- 11 x 6 mm lesion in the posterior right parietal lobe (series
[KL], image 192), previously 22 x 12 mm.

- 2 mm lesion in the left posterior cerebellum (series [KL], image
134), previously 9 x 8 mm.

- Previously noted lesion in the medial right cerebellar hemisphere
is no longer seen.

- Punctate lesion in the left posterior frontal lobe is no longer
seen.

- Previously noted lesion in the right basal ganglia is no longer
seen.

No new enhancing lesions. No mass effect or midline shift. No
extra-axial fluid collection, acute hemorrhage, acute infarct, or
hydrocephalus.

Vascular: Normal flow voids.

Skull and upper cervical spine: Prior right temporal craniotomy.
Bifrontal calvarial lesions have increased in size, now measuring
approximately 4.0 x 1.6 cm on the right (series [KL], image 252),
previously up to 1.5 cm when remeasured similarly. Abnormal left
frontal calvarial enhancement measures up to 4.9 cm (series [KL],
image 246), previously up to 2.4 cm. New enhancing lesions in the
high right frontal calvarium (series [KL], image 268), measuring up
to 1.2 cm, and left frontal calvarium along the left frontal sinus
(series [KL], image 206), measuring to 1.2 cm.

In the left parietal calvarium, a previously noted lesion now
measures up to 2.9 cm (series [KL], image 227), previously up to
cm. New lesions more posteriorly, measuring up to 1.0 cm (series
[KL], image 231) and 0.8 cm (series [KL], image 199). New area of
enhancement in the right parietal calvarium (series [KL], image
208), measuring up to 0.8 cm.

Abnormal focus of enhancement in the left anterior arch of C1
(series [KL], image 103), left occipital condyle (series [KL], image
112), and left aspect of the skull base (series [KL], image 121).

Sinuses/Orbits: Negative.

Other: None.
IMPRESSION: 1. Status post right temporoparietal craniotomy and resection of
subjacent lesion, with interval collapse of the resection cavity.
Minimal enhancement along the resection margin may be postsurgical,
although residual or recurrent tumor cannot be excluded. Attention
on follow-up.
2. Additional previously noted enhancing parenchymal lesions are
stable or decreased in size; some of them are no longer seen. No new
enhancing parenchymal lesions.
3. Interval increase in the size and number of multiple calvarial
lesions, including lesions in the left occipital condyle and left
anterior arch of C1.

## 2021-03-06 MED ORDER — GADOBUTROL 1 MMOL/ML IV SOLN
6.5000 mL | Freq: Once | INTRAVENOUS | Status: AC | PRN
  Administered 2021-03-06: 6.5 mL via INTRAVENOUS

## 2021-03-06 NOTE — Anesthesia Preprocedure Evaluation (Addendum)
Anesthesia Evaluation  Patient identified by MRN, date of birth, ID band Patient awake    Reviewed: Allergy & Precautions, NPO status , Patient's Chart, lab work & pertinent test results  Airway Mallampati: I       Dental   Pulmonary neg pulmonary ROS,    Pulmonary exam normal        Cardiovascular negative cardio ROS Normal cardiovascular exam     Neuro/Psych negative neurological ROS  negative psych ROS   GI/Hepatic negative GI ROS, Neg liver ROS,   Endo/Other  negative endocrine ROS  Renal/GU negative Renal ROS  negative genitourinary   Musculoskeletal   Abdominal Normal abdominal exam  (+)   Peds  Hematology negative hematology ROS (+) anemia ,   Anesthesia Other Findings   Reproductive/Obstetrics                            Anesthesia Physical Anesthesia Plan  ASA: 2  Anesthesia Plan: General   Post-op Pain Management:    Induction: Intravenous  PONV Risk Score and Plan: 4 or greater and Ondansetron, Dexamethasone and Midazolam  Airway Management Planned: Oral ETT  Additional Equipment: None  Intra-op Plan:   Post-operative Plan: Extubation in OR  Informed Consent: I have reviewed the patients History and Physical, chart, labs and discussed the procedure including the risks, benefits and alternatives for the proposed anesthesia with the patient or authorized representative who has indicated his/her understanding and acceptance.     Dental advisory given  Plan Discussed with: CRNA  Anesthesia Plan Comments:        Anesthesia Quick Evaluation

## 2021-03-06 NOTE — Telephone Encounter (Signed)
Scheduled appointment per 08/23 los. Patient is aware.

## 2021-03-06 NOTE — Telephone Encounter (Signed)
Pt left VM stating she noted on my chart that her appts have her as flush/lab " but I don't have a port "  She is inquiring about proceeding to get a port " if I am going to be getting a lot of infusions"  This RN returned call and obtained her identified VM- message left requesting to return call to this RN to discuss further - including that this RN is off tomorrow but is scheduled to work on Monday.

## 2021-03-07 ENCOUNTER — Encounter: Payer: Self-pay | Admitting: Radiation Oncology

## 2021-03-07 ENCOUNTER — Ambulatory Visit (HOSPITAL_COMMUNITY): Payer: 59 | Admitting: Anesthesiology

## 2021-03-07 ENCOUNTER — Ambulatory Visit (HOSPITAL_COMMUNITY)
Admission: RE | Admit: 2021-03-07 | Discharge: 2021-03-07 | Disposition: A | Discharge status: Home / Self Care | Payer: 59 | Attending: Neurological Surgery | Admitting: Neurological Surgery

## 2021-03-07 ENCOUNTER — Ambulatory Visit (HOSPITAL_COMMUNITY): Payer: 59

## 2021-03-07 ENCOUNTER — Encounter (HOSPITAL_COMMUNITY)
Admission: RE | Disposition: A | Discharge status: Home / Self Care | Payer: Self-pay | Source: Home / Self Care | Attending: Neurological Surgery

## 2021-03-07 ENCOUNTER — Other Ambulatory Visit: Payer: Self-pay

## 2021-03-07 ENCOUNTER — Ambulatory Visit: Payer: 59

## 2021-03-07 ENCOUNTER — Encounter (HOSPITAL_COMMUNITY): Payer: Self-pay | Admitting: Neurological Surgery

## 2021-03-07 DIAGNOSIS — C50919 Malignant neoplasm of unspecified site of unspecified female breast: Secondary | ICD-10-CM | POA: Insufficient documentation

## 2021-03-07 DIAGNOSIS — M8458XA Pathological fracture in neoplastic disease, other specified site, initial encounter for fracture: Secondary | ICD-10-CM | POA: Insufficient documentation

## 2021-03-07 DIAGNOSIS — Z79899 Other long term (current) drug therapy: Secondary | ICD-10-CM | POA: Diagnosis not present

## 2021-03-07 DIAGNOSIS — Z510 Encounter for antineoplastic radiation therapy: Secondary | ICD-10-CM | POA: Diagnosis not present

## 2021-03-07 DIAGNOSIS — Z923 Personal history of irradiation: Secondary | ICD-10-CM | POA: Diagnosis not present

## 2021-03-07 DIAGNOSIS — Z20822 Contact with and (suspected) exposure to covid-19: Secondary | ICD-10-CM | POA: Diagnosis not present

## 2021-03-07 DIAGNOSIS — Z803 Family history of malignant neoplasm of breast: Secondary | ICD-10-CM | POA: Insufficient documentation

## 2021-03-07 DIAGNOSIS — C50211 Malignant neoplasm of upper-inner quadrant of right female breast: Secondary | ICD-10-CM | POA: Diagnosis not present

## 2021-03-07 DIAGNOSIS — C7951 Secondary malignant neoplasm of bone: Secondary | ICD-10-CM | POA: Diagnosis present

## 2021-03-07 DIAGNOSIS — Z9221 Personal history of antineoplastic chemotherapy: Secondary | ICD-10-CM | POA: Diagnosis not present

## 2021-03-07 DIAGNOSIS — Z170 Estrogen receptor positive status [ER+]: Secondary | ICD-10-CM | POA: Diagnosis not present

## 2021-03-07 DIAGNOSIS — Z419 Encounter for procedure for purposes other than remedying health state, unspecified: Secondary | ICD-10-CM

## 2021-03-07 DIAGNOSIS — C7931 Secondary malignant neoplasm of brain: Secondary | ICD-10-CM | POA: Diagnosis not present

## 2021-03-07 DIAGNOSIS — C7949 Secondary malignant neoplasm of other parts of nervous system: Secondary | ICD-10-CM | POA: Diagnosis not present

## 2021-03-07 DIAGNOSIS — Z5112 Encounter for antineoplastic immunotherapy: Secondary | ICD-10-CM | POA: Diagnosis not present

## 2021-03-07 HISTORY — PX: KYPHOPLASTY: SHX5884

## 2021-03-07 IMAGING — RF DG THORACOLUMBAR SPINE 2V
1 series · 2 of 2 positions shown · non-contrast
Comparison: [DATE].

CLINICAL DATA: T9 and T10 kyphoplasties.

EXAM:
THORACOLUMBAR SPINE 1V
Radiation exposure index: 78.61 mGy.

[Series 1: run · 2 of 2 slices shown]
[im 1/2]
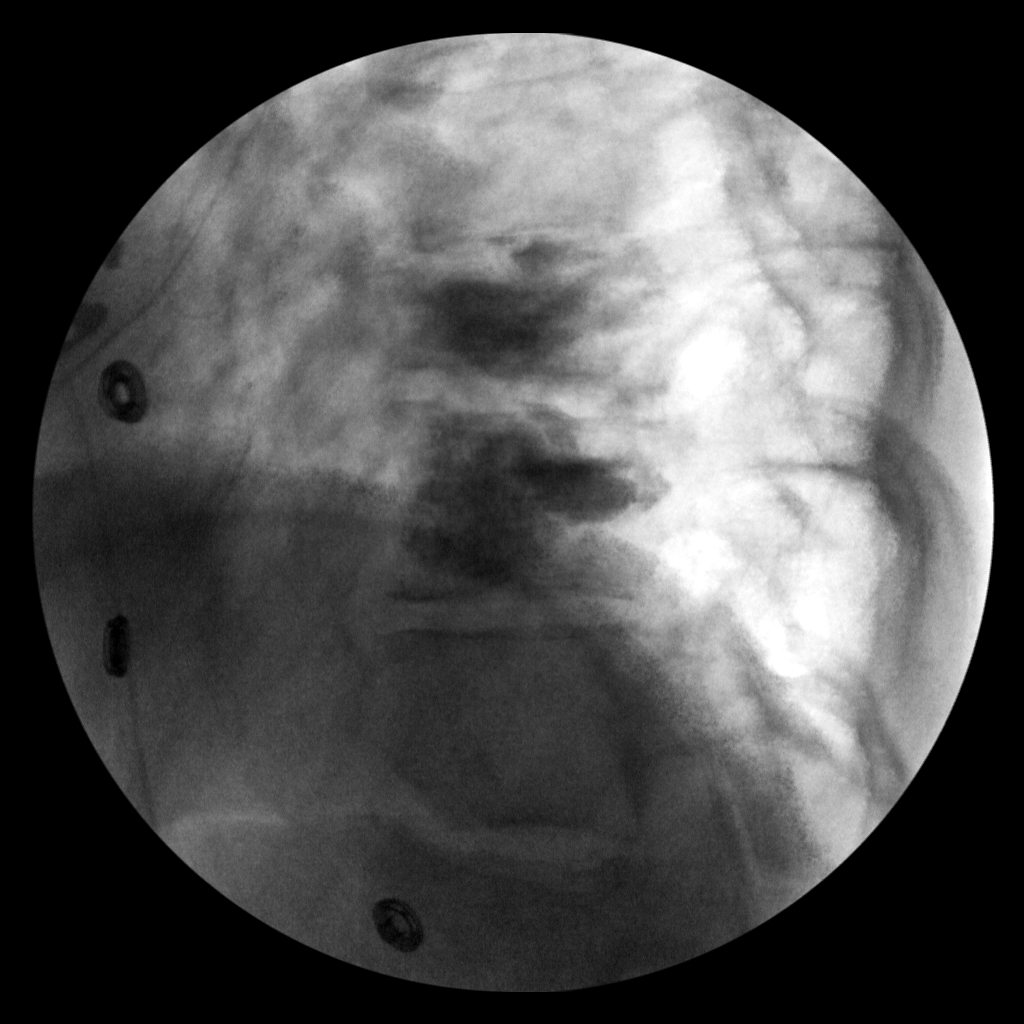
[im 2/2]
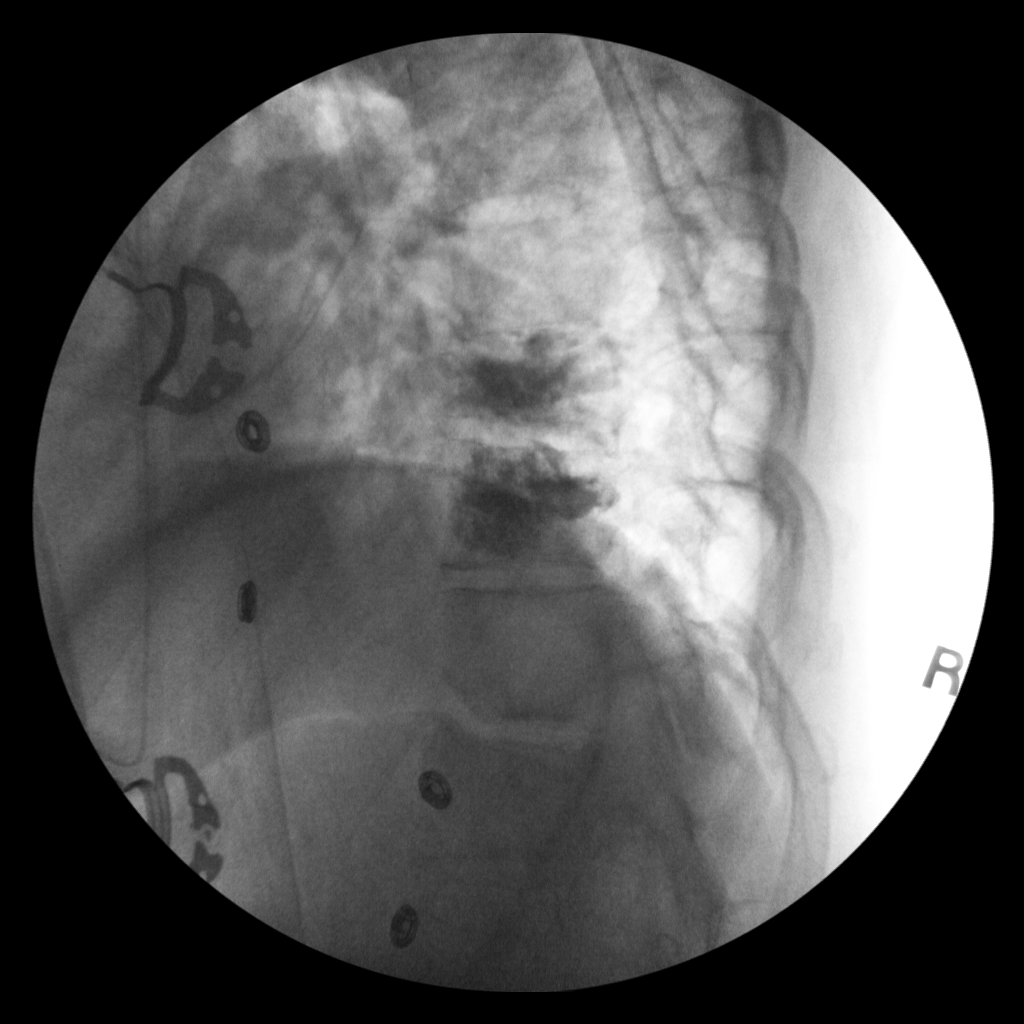

[2 of 2 positions shown; findings below may reference images not displayed]

FINDINGS: Four intraoperative fluoroscopic images were obtained of the lower
thoracic spine. These demonstrate the patient be status post
kyphoplasty of T9 and T10 vertebral bodies.
IMPRESSION: Fluoroscopic guidance provided during T9 and T10 kyphoplasty.

## 2021-03-07 IMAGING — RF DG THORACOLUMBAR SPINE 2V
1 series · 2 of 2 positions shown · non-contrast
Comparison: [DATE].

CLINICAL DATA: T9 and T10 kyphoplasties.

EXAM:
THORACOLUMBAR SPINE 1V
Radiation exposure index: 78.61 mGy.

[Series 1: run · 2 of 2 slices shown]
[im 1/2]
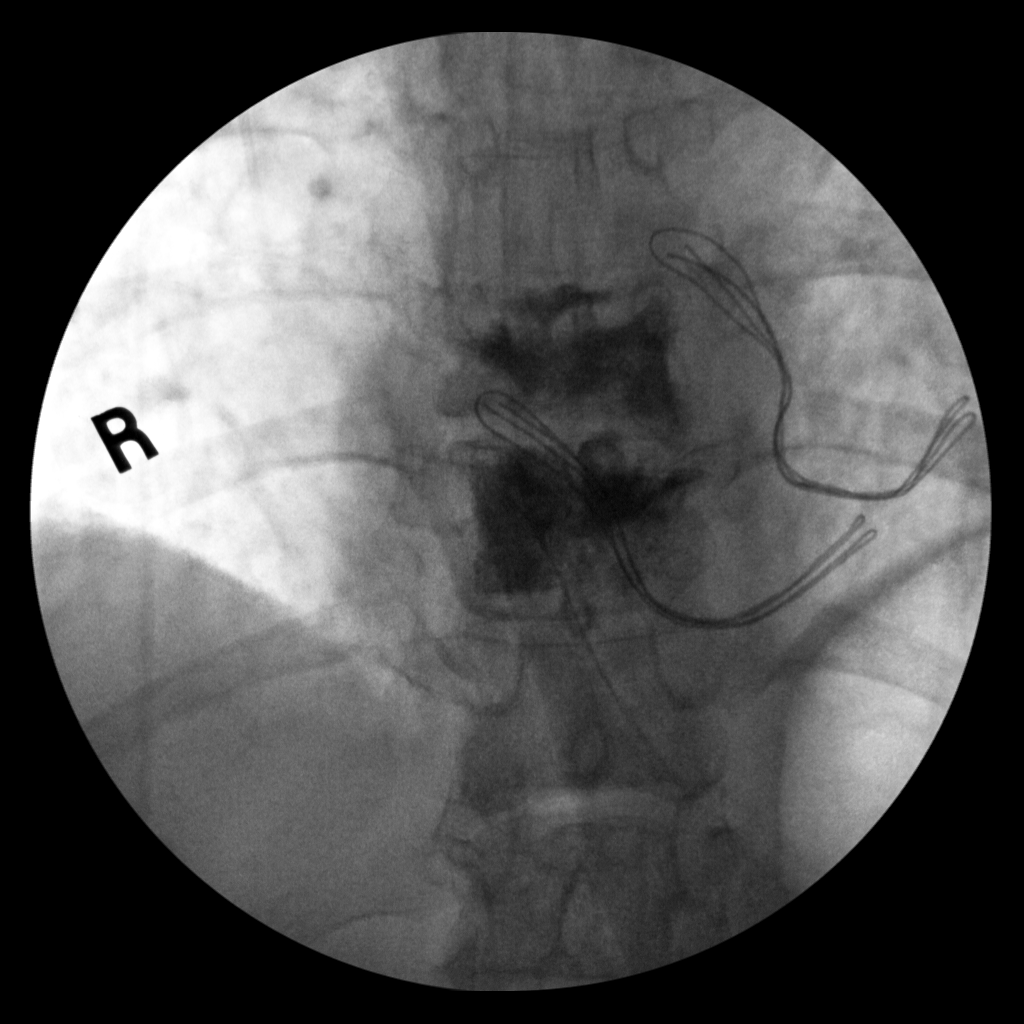
[im 2/2]
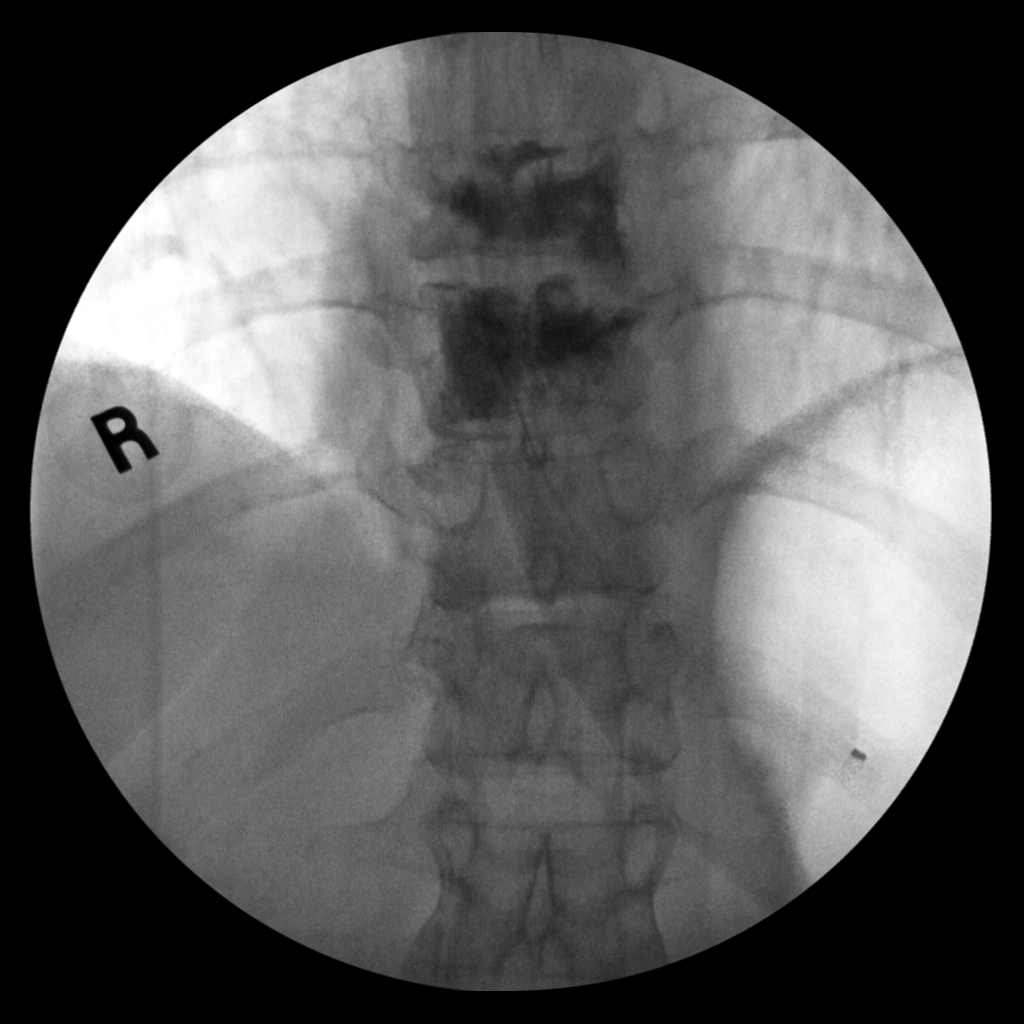

[2 of 2 positions shown; findings below may reference images not displayed]

FINDINGS: Four intraoperative fluoroscopic images were obtained of the lower
thoracic spine. These demonstrate the patient be status post
kyphoplasty of T9 and T10 vertebral bodies.
IMPRESSION: Fluoroscopic guidance provided during T9 and T10 kyphoplasty.

## 2021-03-07 SURGERY — KYPHOPLASTY
Anesthesia: General

## 2021-03-07 MED ORDER — FENTANYL CITRATE (PF) 250 MCG/5ML IJ SOLN
INTRAMUSCULAR | Status: AC
  Filled 2021-03-07: qty 5

## 2021-03-07 MED ORDER — DEXAMETHASONE SODIUM PHOSPHATE 10 MG/ML IJ SOLN
INTRAMUSCULAR | Status: DC | PRN
Start: 2021-03-07 — End: 2021-03-07
  Administered 2021-03-07: 10 mg via INTRAVENOUS

## 2021-03-07 MED ORDER — BUPIVACAINE-EPINEPHRINE 0.5% -1:200000 IJ SOLN
INTRAMUSCULAR | Status: AC
  Filled 2021-03-07: qty 1

## 2021-03-07 MED ORDER — FENTANYL CITRATE (PF) 250 MCG/5ML IJ SOLN
INTRAMUSCULAR | Status: DC | PRN
  Administered 2021-03-07: 50 ug via INTRAVENOUS
  Administered 2021-03-07: 150 ug via INTRAVENOUS

## 2021-03-07 MED ORDER — CEFAZOLIN SODIUM-DEXTROSE 2-4 GM/100ML-% IV SOLN
2.0000 g | INTRAVENOUS | Status: AC
  Administered 2021-03-07: 2 g via INTRAVENOUS
  Filled 2021-03-07: qty 100

## 2021-03-07 MED ORDER — PROPOFOL 10 MG/ML IV BOLUS
INTRAVENOUS | Status: AC
  Filled 2021-03-07: qty 20

## 2021-03-07 MED ORDER — PHENYLEPHRINE 40 MCG/ML (10ML) SYRINGE FOR IV PUSH (FOR BLOOD PRESSURE SUPPORT)
PREFILLED_SYRINGE | INTRAVENOUS | Status: DC | PRN
  Administered 2021-03-07: 120 ug via INTRAVENOUS
  Administered 2021-03-07: 200 ug via INTRAVENOUS
  Administered 2021-03-07: 160 ug via INTRAVENOUS
  Administered 2021-03-07: 80 ug via INTRAVENOUS
  Administered 2021-03-07 (×2): 120 ug via INTRAVENOUS
  Administered 2021-03-07: 200 ug via INTRAVENOUS

## 2021-03-07 MED ORDER — CHLORHEXIDINE GLUCONATE CLOTH 2 % EX PADS: 6.0000 | MEDICATED_PAD | Freq: Once | CUTANEOUS | Status: DC

## 2021-03-07 MED ORDER — CHLORHEXIDINE GLUCONATE 0.12 % MT SOLN
15.0000 mL | Freq: Once | OROMUCOSAL | Status: AC
  Administered 2021-03-07: 15 mL via OROMUCOSAL
  Filled 2021-03-07: qty 15

## 2021-03-07 MED ORDER — PROPOFOL 10 MG/ML IV BOLUS
INTRAVENOUS | Status: DC | PRN
  Administered 2021-03-07: 150 mg via INTRAVENOUS
  Administered 2021-03-07: 50 mg via INTRAVENOUS

## 2021-03-07 MED ORDER — ORAL CARE MOUTH RINSE: 15.0000 mL | Freq: Once | OROMUCOSAL | Status: AC

## 2021-03-07 MED ORDER — PHENYLEPHRINE 40 MCG/ML (10ML) SYRINGE FOR IV PUSH (FOR BLOOD PRESSURE SUPPORT)
PREFILLED_SYRINGE | INTRAVENOUS | Status: AC
  Filled 2021-03-07: qty 20

## 2021-03-07 MED ORDER — OXYCODONE HCL 5 MG/5ML PO SOLN: 5.0000 mg | Freq: Once | ORAL | Status: DC | PRN

## 2021-03-07 MED ORDER — LIDOCAINE-EPINEPHRINE 1 %-1:100000 IJ SOLN
INTRAMUSCULAR | Status: DC | PRN
  Administered 2021-03-07: 4.5 mL

## 2021-03-07 MED ORDER — ACETAMINOPHEN 325 MG PO TABS: 325.0000 mg | ORAL_TABLET | ORAL | Status: DC | PRN

## 2021-03-07 MED ORDER — EPHEDRINE SULFATE-NACL 50-0.9 MG/10ML-% IV SOSY
PREFILLED_SYRINGE | INTRAVENOUS | Status: DC | PRN
  Administered 2021-03-07: 10 mg via INTRAVENOUS
  Administered 2021-03-07: 5 mg via INTRAVENOUS
  Administered 2021-03-07: 10 mg via INTRAVENOUS

## 2021-03-07 MED ORDER — ACETAMINOPHEN 160 MG/5ML PO SOLN: 325.0000 mg | ORAL | Status: DC | PRN

## 2021-03-07 MED ORDER — SUGAMMADEX SODIUM 200 MG/2ML IV SOLN
INTRAVENOUS | Status: DC | PRN
  Administered 2021-03-07: 200 mg via INTRAVENOUS

## 2021-03-07 MED ORDER — SUCCINYLCHOLINE CHLORIDE 200 MG/10ML IV SOSY
PREFILLED_SYRINGE | INTRAVENOUS | Status: DC | PRN
  Administered 2021-03-07: 20 mg via INTRAVENOUS

## 2021-03-07 MED ORDER — KETOROLAC TROMETHAMINE 30 MG/ML IJ SOLN: 30.0000 mg | Freq: Once | INTRAMUSCULAR | Status: DC | PRN

## 2021-03-07 MED ORDER — ONDANSETRON HCL 4 MG/2ML IJ SOLN
INTRAMUSCULAR | Status: AC
  Filled 2021-03-07: qty 2

## 2021-03-07 MED ORDER — MIDAZOLAM HCL 2 MG/2ML IJ SOLN
INTRAMUSCULAR | Status: AC
  Filled 2021-03-07: qty 2

## 2021-03-07 MED ORDER — MEPERIDINE HCL 25 MG/ML IJ SOLN: 6.2500 mg | INTRAMUSCULAR | Status: DC | PRN

## 2021-03-07 MED ORDER — 0.9 % SODIUM CHLORIDE (POUR BTL) OPTIME
TOPICAL | Status: DC | PRN
  Administered 2021-03-07: 1000 mL

## 2021-03-07 MED ORDER — LIDOCAINE-EPINEPHRINE 1 %-1:100000 IJ SOLN
INTRAMUSCULAR | Status: AC
  Filled 2021-03-07: qty 1

## 2021-03-07 MED ORDER — FENTANYL CITRATE (PF) 100 MCG/2ML IJ SOLN: 25.0000 ug | INTRAMUSCULAR | Status: DC | PRN

## 2021-03-07 MED ORDER — IOPAMIDOL (ISOVUE-300) INJECTION 61%
INTRAVENOUS | Status: DC | PRN
  Administered 2021-03-07: 50 mL

## 2021-03-07 MED ORDER — LACTATED RINGERS IV SOLN: INTRAVENOUS | Status: DC

## 2021-03-07 MED ORDER — BUPIVACAINE-EPINEPHRINE (PF) 0.5% -1:200000 IJ SOLN
INTRAMUSCULAR | Status: DC | PRN
  Administered 2021-03-07: 4.5 mL via PERINEURAL

## 2021-03-07 MED ORDER — ROCURONIUM BROMIDE 10 MG/ML (PF) SYRINGE
PREFILLED_SYRINGE | INTRAVENOUS | Status: DC | PRN
  Administered 2021-03-07: 60 mg via INTRAVENOUS

## 2021-03-07 MED ORDER — LIDOCAINE 2% (20 MG/ML) 5 ML SYRINGE
INTRAMUSCULAR | Status: DC | PRN
  Administered 2021-03-07: 100 mg via INTRAVENOUS

## 2021-03-07 MED ORDER — MIDAZOLAM HCL 2 MG/2ML IJ SOLN
INTRAMUSCULAR | Status: DC | PRN
  Administered 2021-03-07: 2 mg via INTRAVENOUS

## 2021-03-07 MED ORDER — ONDANSETRON HCL 4 MG/2ML IJ SOLN
INTRAMUSCULAR | Status: DC | PRN
  Administered 2021-03-07: 4 mg via INTRAVENOUS

## 2021-03-07 MED ORDER — DEXAMETHASONE SODIUM PHOSPHATE 10 MG/ML IJ SOLN
INTRAMUSCULAR | Status: AC
  Filled 2021-03-07: qty 1

## 2021-03-07 MED ORDER — OXYCODONE HCL 5 MG PO TABS: 5.0000 mg | ORAL_TABLET | Freq: Once | ORAL | Status: DC | PRN

## 2021-03-07 MED ORDER — ONDANSETRON HCL 4 MG/2ML IJ SOLN: 4.0000 mg | Freq: Once | INTRAMUSCULAR | Status: DC | PRN

## 2021-03-07 SURGICAL SUPPLY — 42 items
ADH SKN CLS APL DERMABOND .7 (GAUZE/BANDAGES/DRESSINGS) ×1
BAG COUNTER SPONGE SURGICOUNT (BAG) ×2 IMPLANT
BAG SPNG CNTER NS LX DISP (BAG) ×1
BLADE CLIPPER SURG (BLADE) IMPLANT
BLADE SURG 15 STRL LF DISP TIS (BLADE) ×1 IMPLANT
BLADE SURG 15 STRL SS (BLADE) ×2
CARTRIDGE OIL MAESTRO DRILL (MISCELLANEOUS) IMPLANT
CEMENT KYPHON CX01A KIT/MIXER (Cement) ×2 IMPLANT
DECANTER SPIKE VIAL GLASS SM (MISCELLANEOUS) ×2 IMPLANT
DERMABOND ADVANCED (GAUZE/BANDAGES/DRESSINGS) ×1
DERMABOND ADVANCED .7 DNX12 (GAUZE/BANDAGES/DRESSINGS) ×1 IMPLANT
DIFFUSER DRILL AIR PNEUMATIC (MISCELLANEOUS) ×2 IMPLANT
DRAPE C-ARM 42X72 X-RAY (DRAPES) ×2 IMPLANT
DRAPE HALF SHEET 40X57 (DRAPES) IMPLANT
DRAPE LAPAROTOMY 100X72X124 (DRAPES) ×2 IMPLANT
DRAPE SURG 17X23 STRL (DRAPES) ×2 IMPLANT
DRAPE WARM FLUID 44X44 (DRAPES) IMPLANT
DURAPREP 26ML APPLICATOR (WOUND CARE) ×2 IMPLANT
GAUZE 4X4 16PLY ~~LOC~~+RFID DBL (SPONGE) ×2 IMPLANT
GLOVE EXAM NITRILE XL STR (GLOVE) IMPLANT
GLOVE SURG ENC MOIS LTX SZ8 (GLOVE) IMPLANT
GLOVE SURG UNDER POLY LF SZ7.5 (GLOVE) ×8 IMPLANT
GLOVE SURG UNDER POLY LF SZ8.5 (GLOVE) IMPLANT
GOWN STRL REUS W/ TWL LRG LVL3 (GOWN DISPOSABLE) IMPLANT
GOWN STRL REUS W/ TWL XL LVL3 (GOWN DISPOSABLE) ×1 IMPLANT
GOWN STRL REUS W/TWL 2XL LVL3 (GOWN DISPOSABLE) ×2 IMPLANT
GOWN STRL REUS W/TWL LRG LVL3 (GOWN DISPOSABLE)
GOWN STRL REUS W/TWL XL LVL3 (GOWN DISPOSABLE) ×2
KIT BASIN OR (CUSTOM PROCEDURE TRAY) ×2 IMPLANT
KIT TURNOVER KIT B (KITS) ×2 IMPLANT
NEEDLE HYPO 25X1 1.5 SAFETY (NEEDLE) ×2 IMPLANT
NS IRRIG 1000ML POUR BTL (IV SOLUTION) ×2 IMPLANT
OIL CARTRIDGE MAESTRO DRILL (MISCELLANEOUS)
PACK SURGICAL SETUP 50X90 (CUSTOM PROCEDURE TRAY) ×2 IMPLANT
PAD ARMBOARD 7.5X6 YLW CONV (MISCELLANEOUS) ×4 IMPLANT
STAPLER SKIN PROX WIDE 3.9 (STAPLE) IMPLANT
SUT VIC AB 3-0 SH 8-18 (SUTURE) ×2 IMPLANT
SYR CONTROL 10ML LL (SYRINGE) ×2 IMPLANT
TOWEL GREEN STERILE (TOWEL DISPOSABLE) ×2 IMPLANT
TOWEL GREEN STERILE FF (TOWEL DISPOSABLE) ×2 IMPLANT
TRAY KYPHOPAK 15/3 ONESTEP 1ST (MISCELLANEOUS) ×4 IMPLANT
TRAY KYPHOPAK 20/3 ONESTEP 1ST (MISCELLANEOUS) IMPLANT

## 2021-03-07 NOTE — Progress Notes (Signed)
Patient denies headaches, fatigue, vision/ hearing changes, motor/ memory/ speech or oral issues. Patient reports skin redness (rectal), and mild nausea.  Meaningful use questions complete and patient notified of 10:30am telephone appointment on 03/11/21 and expressed understanding.

## 2021-03-07 NOTE — OR Nursing (Signed)
Kyphon Cement used during procedure

## 2021-03-07 NOTE — Discharge Instructions (Addendum)
Discharge Instructions ° °No restriction in activities, slowly increase your activity back to normal.  ° °Your incision is closed with dermabond (purple glue). This will naturally fall off over the next 1-2 weeks.  ° °Okay to shower on the day of discharge. Use regular soap and water and try to be gentle when cleaning your incision.  ° °Follow up with Dr. Ostergard in 2 weeks after discharge. If you do not already have a discharge appointment, please call his office at [phone] to schedule a follow up appointment. If you have any concerns or questions, please call the office and let us know. °

## 2021-03-07 NOTE — H&P (Signed)
Surgical H&P Update  HPI: 50 y.o. woman with a history of metastatic breast Ca with continued low back pain 2/2 pathologic frx. Still having thoracic back pain, no radicular pain, no new areas of pain, no recent falls.  PMHx:  Past Medical History:  Diagnosis Date   Arthritis    lower back   Cancer [facility])    right breast cancer   Family history of breast cancer    Personal history of chemotherapy    Personal history of radiation therapy    FamHx:  Family History  Problem Relation Age of Onset   Lung cancer Maternal Grandfather    Esophageal cancer Paternal Grandfather 49   Breast cancer Cousin 87   SocHx:  reports that she has never smoked. She has never used smokeless tobacco. She reports that she does not currently use alcohol. She reports that she does not use drugs.  Physical Exam: AOx3, PERRL, FS, TM  Strength 5/5 x4, SILTx4  Assesment/Plan: 50 y.o. woman with w/ metastatic breast cancer and pathologic thoracic spine fractures, here for kyphoplasty. Risks, benefits, and alternatives discussed and the patient would like to continue with surgery.  -OR today -discharge home from PACU post-op  Judith Part, MD 03/07/21 7:25 AM

## 2021-03-07 NOTE — Op Note (Signed)
PATIENT: Monica [name]  DAY OF SURGERY: 03/07/21   PRE-OPERATIVE DIAGNOSIS:  Pathologic thoracic spine fractures   POST-OPERATIVE DIAGNOSIS:  Same   PROCEDURE:  T9, T10 kyphoplasty   SURGEON:  Surgeon(s) and Role:    Judith Part, MD - Primary   ANESTHESIA: ETGA   BRIEF HISTORY: This is a 50 year old woman who presented with back pain 2/2 pathologic fractures due to breast cancer. Her pain continued after treatment with XRT, I therefore recommended kyphoplasty. This was discussed with the patient as well as risks, benefits, and alternatives and wished to proceed with surgery.   OPERATIVE DETAIL: The patient was taken to the operating room and placed on the OR table in the prone position. A formal time out was performed with two patient identifiers and confirmed the operative site. Anesthesia was induced by the anesthesia team. The operative site was marked, the area was then prepped and draped in a sterile fashion.   Fluoroscopy was used to localize the operative levels. Local anesthetic was infiltrated and jamshidi needles were directed into the pedicles at T9 bilaterally and into the vertebral body. A balloon was placed and inflated with contrast under fluoroscopy, then deflated and removed. PMMA was then injected with contrast under fluoroscopy. The bone at T9 was very sclerotic and did not expand much, as expected. The cannulas were removed and the technique was repeated at T10.  All instrument and sponge counts were correct, dermabond was applied to the entry sites. The patient was then returned to anesthesia for emergence. No apparent complications at the completion of the procedure.   EBL:  54m   DRAINS: none   SPECIMENS: none   TJudith Part MD 03/07/21 7:26 AM

## 2021-03-07 NOTE — Transfer of Care (Signed)
Immediate Anesthesia Transfer of Care Note  Patient: Monica [name]  Procedure(s) Performed: Thoracic nine, Thoracic ten KYPHOPLASTY  Patient Location: PACU  Anesthesia Type:General  Level of Consciousness: drowsy and patient cooperative  Airway & Oxygen Therapy: Patient Spontanous Breathing  Post-op Assessment: Report given to RN and Post -op Vital signs reviewed and stable  Post vital signs: Reviewed and stable  Last Vitals:  Vitals Value Taken Time  BP 126/71 03/07/21 0934  Temp    Pulse 90 03/07/21 0937  Resp 14 03/07/21 0937  SpO2 99 % 03/07/21 0937  Vitals shown include unvalidated device data.  Last Pain:  Vitals:   03/07/21 0607  TempSrc:   PainSc: 0-No pain         Complications: No notable events documented.

## 2021-03-07 NOTE — Anesthesia Postprocedure Evaluation (Signed)
Anesthesia Post Note  Patient: Monica [name]  Procedure(s) Performed: Thoracic nine, Thoracic ten KYPHOPLASTY     Patient location during evaluation: PACU Anesthesia Type: General Level of consciousness: awake Pain management: pain level controlled Vital Signs Assessment: post-procedure vital signs reviewed and stable Respiratory status: spontaneous breathing Cardiovascular status: stable Postop Assessment: no apparent nausea or vomiting Anesthetic complications: no   No notable events documented.  Last Vitals:  Vitals:   03/07/21 0950 03/07/21 1005  BP: 115/65 110/71  Pulse: 78 78  Resp: 11 (!) 9  Temp:  36.9 C  SpO2: 97% 97%    Last Pain:  Vitals:   03/07/21 1005  TempSrc:   PainSc: 0-No pain                 Huston Foley

## 2021-03-07 NOTE — Anesthesia Procedure Notes (Signed)
Procedure Name: Intubation Date/Time: 03/07/2021 7:45 AM Performed by: Lance Coon, CRNA Pre-anesthesia Checklist: Patient identified, Emergency Drugs available, Suction available, Patient being monitored and Timeout performed Patient Re-evaluated:Patient Re-evaluated prior to induction Oxygen Delivery Method: Circle system utilized Preoxygenation: Pre-oxygenation with 100% oxygen Induction Type: IV induction Ventilation: Mask ventilation without difficulty Laryngoscope Size: Miller and 3 Grade View: Grade I Tube type: Oral Tube size: 7.0 mm Number of attempts: 1 Airway Equipment and Method: Stylet Placement Confirmation: ETT inserted through vocal cords under direct vision, positive ETCO2 and breath sounds checked- equal and bilateral Secured at: 21 cm Tube secured with: Tape Dental Injury: Teeth and Oropharynx as per pre-operative assessment

## 2021-03-10 ENCOUNTER — Other Ambulatory Visit: Payer: No Typology Code available for payment source

## 2021-03-10 ENCOUNTER — Ambulatory Visit
Admission: RE | Admit: 2021-03-10 | Discharge: 2021-03-10 | Disposition: A | Discharge status: Home / Self Care | Payer: 59 | Source: Ambulatory Visit | Attending: Radiation Oncology | Admitting: Radiation Oncology

## 2021-03-10 ENCOUNTER — Encounter (HOSPITAL_COMMUNITY): Payer: Self-pay | Admitting: Neurological Surgery

## 2021-03-10 ENCOUNTER — Other Ambulatory Visit: Payer: Self-pay | Admitting: *Deleted

## 2021-03-10 ENCOUNTER — Telehealth: Payer: Self-pay | Admitting: *Deleted

## 2021-03-10 ENCOUNTER — Other Ambulatory Visit: Payer: Self-pay

## 2021-03-10 DIAGNOSIS — C7801 Secondary malignant neoplasm of right lung: Secondary | ICD-10-CM

## 2021-03-10 DIAGNOSIS — C7931 Secondary malignant neoplasm of brain: Secondary | ICD-10-CM

## 2021-03-10 DIAGNOSIS — Z170 Estrogen receptor positive status [ER+]: Secondary | ICD-10-CM

## 2021-03-10 DIAGNOSIS — C50211 Malignant neoplasm of upper-inner quadrant of right female breast: Secondary | ICD-10-CM

## 2021-03-10 DIAGNOSIS — C7802 Secondary malignant neoplasm of left lung: Secondary | ICD-10-CM

## 2021-03-10 DIAGNOSIS — Z5112 Encounter for antineoplastic immunotherapy: Secondary | ICD-10-CM | POA: Diagnosis not present

## 2021-03-10 DIAGNOSIS — C7951 Secondary malignant neoplasm of bone: Secondary | ICD-10-CM

## 2021-03-10 DIAGNOSIS — C7989 Secondary malignant neoplasm of other specified sites: Secondary | ICD-10-CM

## 2021-03-10 DIAGNOSIS — C787 Secondary malignant neoplasm of liver and intrahepatic bile duct: Secondary | ICD-10-CM

## 2021-03-10 NOTE — Telephone Encounter (Signed)
This RN spoke with pt today per her call stating she would like to proceed to getting a port a cath ( she states she has had to be stuck numerous times to get an IV for therapy ) .  She also states she has a " rash in the fold above my anus "  She states she has 4-5 pimples in the area. She has been using A&D ointment - without area improving.  She denies any other areas of above in her labia or genital area.  The rash does not itch.  Per discussion recommendation given for pt to cleanse the area with an antibacterial soap and rinse well twice a day.  Discussed use of applying nesporin/triple antibiotic to area and use of small (1 drop) of tea tree oil on area bid. Pt is to call this RN on Wednesday with update - if worsens she is to call tomorrow.  Halle verbalized understanding.  Verbal order placed for port under IR.

## 2021-03-11 ENCOUNTER — Other Ambulatory Visit: Payer: Self-pay | Admitting: Student

## 2021-03-11 ENCOUNTER — Ambulatory Visit
Admission: RE | Admit: 2021-03-11 | Discharge: 2021-03-11 | Disposition: A | Discharge status: Home / Self Care | Payer: 59 | Source: Ambulatory Visit | Attending: Radiation Oncology | Admitting: Radiation Oncology

## 2021-03-11 ENCOUNTER — Encounter: Payer: Self-pay | Admitting: Radiation Oncology

## 2021-03-11 DIAGNOSIS — Z5112 Encounter for antineoplastic immunotherapy: Secondary | ICD-10-CM | POA: Diagnosis not present

## 2021-03-11 DIAGNOSIS — C7989 Secondary malignant neoplasm of other specified sites: Secondary | ICD-10-CM

## 2021-03-11 DIAGNOSIS — D496 Neoplasm of unspecified behavior of brain: Secondary | ICD-10-CM

## 2021-03-11 DIAGNOSIS — C7951 Secondary malignant neoplasm of bone: Secondary | ICD-10-CM

## 2021-03-11 DIAGNOSIS — C50211 Malignant neoplasm of upper-inner quadrant of right female breast: Secondary | ICD-10-CM

## 2021-03-11 DIAGNOSIS — Z170 Estrogen receptor positive status [ER+]: Secondary | ICD-10-CM

## 2021-03-11 MED ORDER — SILVER SULFADIAZINE 1 % EX CREA: TOPICAL_CREAM | Freq: Two times a day (BID) | CUTANEOUS | Status: DC

## 2021-03-11 NOTE — Progress Notes (Signed)
Radiation Oncology         [phone] ________________________________   Outpatient Follow Up - Conducted via telephone due to current COVID-19 concerns for limiting patient exposure  I spoke with the patient to conduct this consult visit via telephone to spare the patient unnecessary potential exposure in the healthcare setting during the current COVID-19 pandemic. The patient was notified in advance and was offered a [place] meeting to allow for face to face communication but unfortunately reported that they did not have the appropriate resources/technology to support such a visit and instead preferred to proceed with a telephone visit.  Name: Monica [name]        MRN: [id number]  Date of Service: 03/11/2021 DOB: 05/03/1971  IN:[facility], [place]oma Sousa, PA-C  Monica Stalker, PA-C     REFERRING PHYSICIAN: [name], PA-C   DIAGNOSIS: The primary encounter diagnosis was Malignant neoplasm metastatic to other site [facility]). Diagnoses of Malignant neoplasm of upper-inner quadrant of right breast in female, estrogen receptor positive ([place]), Brain tumor ([place]), and Bone metastases ([place]) were also pertinent to this visit.   HISTORY OF PRESENT ILLNESS: Monica [name] is a 50 y.o. 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  She was treated by Dr. [name] in the adjuvant setting.  She does have metastatic disease to the brain in May 2022, she received preoperative SRS to the right temporal lobe definitive SRS to 8 additional sites.  She is also receiving palliative radiotherapy to the sacrum and pelvis along with T9-L3 vertebral body.  She did have concerns for disease in her femurs as well and underwent radical resection of bone tumor in the right proximal femur and right proximal femur replacement on 01/22/2021 with Dr. Warren Lacy at [facility].  In the midst of all of this she receive  Xeloda. She is currently receiving Tukysa, under the care of Dr. Jana Hakim.  She began targeted HER2 therapy on 02/10/2021.  She is a also receiving radiotherapy to bilateral femurs. She had recent MRI of the brain on 03/06/2021.  While progressive calvarial disease was noted, stability of her post resection cavity was noted and no new disease was found.  3 of the previously treated sites had resolved and the remaining 3  lesions have all improved significantly.  She is contacted by phone to review these results.  Her case was discussed yesterday in multidisciplinary brain oncology conference.  Also of note she did undergo kyphoplasty of T9 and T10 with Dr. Venetia Constable last Friday.  PREVIOUS RADIATION THERAPY: The patient is currently receiving palliative radiotherapy to bilateral femurs.  12/05/2020 through 12/23/2020 Site Technique Total Dose (Gy) Dose per Fx (Gy) Completed Fx Beam Energies  Thoracic Spine: Spine_T9-L3 Complex 30/30 3 10/10 15X  Pelvis: Pelvis_sacrum Complex 30/30 3 10/10 15X    12/05/2020 through 12/05/2020  SRS Treatment   PTV1_3.8 cm right temporal received 15 Gy in 1 fraction (preoperative SRS) PTV2_2.4 cm right parietal received 18 Gy in 1 fraction (definitive single fraction SRS) The remaining sites PTV3_-PTV9_ each received 20 Gy in 1 fraction (definitive single fraction SRS).  08/15/2018 - 09/28/2018: The patient initially received a dose of 50.4 Gy in 28 fractions to the right breast using whole-breast tangent fields. This was delivered using a 3-D conformal technique. The patient then received a boost to the seroma. This delivered an additional 10 Gy in 5 fractions using 12E, 9E electrons with a special teletherapy  technique. The total dose was 60.4 Gy.  PAST MEDICAL HISTORY:  Past Medical History:  Diagnosis Date   Arthritis    lower back   Cancer [facility])    right breast cancer   Family history of breast cancer    Personal history of chemotherapy    Personal history of  radiation therapy        PAST SURGICAL HISTORY: Past Surgical History:  Procedure Laterality Date   ABDOMINAL HYSTERECTOMY     APPLICATION OF CRANIAL NAVIGATION N/A 12/06/2020   Procedure: APPLICATION OF CRANIAL NAVIGATION;  Surgeon: Judith Part, MD;  Location: [place];  Service: Neurosurgery;  Laterality: N/A;  RM 20   BREAST LUMPECTOMY Right    BREAST LUMPECTOMY WITH RADIOACTIVE SEED AND SENTINEL LYMPH NODE BIOPSY Right 02/16/2018   Procedure: RIGHT BREAST LUMPECTOMY WITH RADIOACTIVE SEED AND SENTINEL LYMPH NODE BIOPSY;  Surgeon: [name], MD;  Location: [place];  Service: General;  Laterality: Right;   CRANIOTOMY Right 12/06/2020   Procedure: Right sided awake craniotomy for tumor resection;  Surgeon: Judith Part, MD;  Location: [place];  Service: Neurosurgery;  Laterality: Right;   HYMENECTOMY     IR US GUIDE BX ASP/DRAIN  12/02/2020   KYPHOPLASTY N/A 03/07/2021   Procedure: Thoracic nine, Thoracic ten KYPHOPLASTY;  Surgeon: Judith Part, MD;  Location: [place];  Service: Neurosurgery;  Laterality: N/A;   PORTACATH PLACEMENT Right 02/16/2018   Procedure: INSERTION PORT-A-CATH;  Surgeon: [name], MD;  Location: [place];  Service: General;  Laterality: Right;   RE-EXCISION OF BREAST LUMPECTOMY Right 02/22/2018   Procedure: RE-EXCISION OF RIGHT  BREAST LUMPECTOMY;  Surgeon: [name], MD;  Location: [place];  Service: General;  Laterality: Right;   REPAIR VAGINAL CUFF N/A 02/07/2017   Procedure: REPAIR VAGINAL CUFF;  Surgeon: Ena Dawley, MD;  Location: [facility];  Service: Gynecology;  Laterality: N/A;   ROBOTIC ASSISTED TOTAL HYSTERECTOMY WITH SALPINGECTOMY Left 01/20/2017   Procedure: ROBOTIC ASSISTED TOTAL HYSTERECTOMY WITH SALPINGECTOMY;  Surgeon: [name], MD;  Location: [facility];  Service: Gynecology;  Laterality: Left;   TOTAL HIP ARTHROPLASTY Right 2022   total hip and partial femur, per  pt     FAMILY HISTORY:  Family History  Problem Relation Age of Onset   Lung cancer Maternal Grandfather    Esophageal cancer Paternal Grandfather 72   Breast cancer Cousin 56     SOCIAL HISTORY:  reports that she has never smoked. She has never used smokeless tobacco. She reports that she does not currently use alcohol. She reports that she does not use drugs. The patient is separated and lives in [place]. She works as an [employer] loss adjuster.    ALLERGIES: Patient has no known allergies.   MEDICATIONS:  Current Outpatient Medications  Medication Sig Dispense Refill   acetaminophen (TYLENOL) 500 MG tablet Take 1 tablet (500 mg total) by mouth 3 (three) times daily as needed for mild pain (or Fever >/= 101). Take with ibuprofen 400 mg     b complex vitamins capsule Take 1 capsule by mouth daily. 900 mg     capecitabine (XELODA) 500 MG tablet Take 1,500 mg by mouth 3 (three) times daily.     Cholecalciferol (VITAMIN D) 125 MCG (5000 UT) CAPS Take 5,000 Units by mouth daily.     DULoxetine (CYMBALTA) 20 MG capsule TAKE 1 CAPSULE BY MOUTH EVERY DAY 90 capsule 2   ferrous sulfate 325 (65 FE) MG  tablet Take 325 mg by mouth daily with breakfast.     fluconazole (DIFLUCAN) 100 MG tablet Take 100 mg by mouth daily.     gabapentin (NEURONTIN) 300 MG capsule Take 1 capsule (300 mg total) by mouth 3 (three) times daily. 90 capsule 6   ibuprofen (ADVIL) 400 MG tablet Take 1 tablet (400 mg total) by mouth 3 (three) times daily. Take with tylenol 500 mg (Patient taking differently: Take 400 mg by mouth 3 (three) times daily as needed for mild pain or moderate pain.) 90 tablet 3   lidocaine (LIDODERM) 5 % Place 1 patch onto the skin daily as needed (for pain- Remove & Discard patch within 12 hours or as directed by MD).     ondansetron (ZOFRAN ODT) 4 MG disintegrating tablet Take 1 tablet (4 mg total) by mouth See admin instructions. Dissolve 4 mg in the mouth three times a day and an  additional 4 mg at bedtime as needed for nausea/vomiting     polyethylene glycol (MIRALAX / GLYCOLAX) 17 g packet Take 17 g by mouth daily as needed for moderate constipation. 14 each 0   prochlorperazine (COMPAZINE) 10 MG tablet Take 1 tablet (10 mg total) by mouth every 6 (six) hours. (Patient taking differently: Take 10 mg by mouth 3 (three) times daily.) 30 tablet 3   pyridOXINE (VITAMIN B-6) 100 MG tablet Take 100 mg by mouth daily.     senna-docusate (SENOKOT-S) 8.6-50 MG tablet Take 2 tablets by mouth 2 (two) times daily.     traMADol (ULTRAM) 50 MG tablet Take 1 tablet (50 mg total) by mouth every 6 (six) hours as needed. 120 tablet 0   TUKYSA 150 MG tablet Take 150 mg by mouth 2 (two) times daily.     pantoprazole (PROTONIX) 40 MG tablet Take 40 mg by mouth daily. (Patient not taking: Reported on 03/07/2021)     traZODone (DESYREL) 50 MG tablet Take 1 tablet (50 mg total) by mouth at bedtime as needed for sleep. (Patient not taking: No sig reported)     No current facility-administered medications for this encounter.   Facility-Administered Medications Ordered in Other Encounters  Medication Dose Route Frequency Provider Last Rate Last Admin   sodium chloride flush (NS) 0.9 % injection 10 mL  10 mL Intracatheter Once Magrinat, [name], MD         REVIEW OF SYSTEMS: On review of systems, the patient reports that she is doing well overall. She denies any headaches, auditory or movement dysfunction. She has had some blurred vision but attributes this to her oncology medications. She reports she has had some anorectal irritation as well and is using Tea Tree Oil on this.  She has also reports that she is doing well since her procedure last Friday and feels like her back pain is improved.  No other complaints are verbalized.      PHYSICAL EXAM:  Unable to assess given encounter type.   ECOG = 1  0 - Asymptomatic (Fully active, able to carry on all predisease activities without  restriction)  1 - Symptomatic but completely ambulatory (Restricted in physically strenuous activity but ambulatory and able to carry out work of a light or sedentary nature. For example, light housework, office work)  2 - Symptomatic, <50% in bed during the day (Ambulatory and capable of all self care but unable to carry out any work activities. Up and about more than 50% of waking hours)  3 - Symptomatic, >50% in bed,  but not bedbound (Capable of only limited self-care, confined to bed or chair 50% or more of waking hours)  4 - Bedbound (Completely disabled. Cannot carry on any self-care. Totally confined to bed or chair)  5 - Death   Eustace Pen MM, Creech RH, Tormey DC, et al. [phone]). "Toxicity and response criteria of the [facility]". [place] Oncol. 5 (6): 649-55    LABORATORY DATA:  Lab Results  Component Value Date   WBC 5.6 03/04/2021   HGB 10.5 (L) 03/04/2021   HCT 33.5 (L) 03/04/2021   MCV 95.2 03/04/2021   PLT 255 03/04/2021   Lab Results  Component Value Date   NA 139 03/04/2021   K 4.1 03/04/2021   CL 108 03/04/2021   CO2 22 03/04/2021   Lab Results  Component Value Date   ALT 31 03/04/2021   AST 62 (H) 03/04/2021   ALKPHOS 264 (H) 03/04/2021   BILITOT 0.5 03/04/2021      RADIOGRAPHY: DG THORACOLUMABAR SPINE  Result Date: 03/07/2021 CLINICAL DATA:  T9 and T10 kyphoplasties. EXAM: THORACOLUMBAR SPINE 1V Radiation exposure index: 78.61 mGy. COMPARISON:  September 13, 2020. FINDINGS: Four intraoperative fluoroscopic images were obtained of the lower thoracic spine. These demonstrate the patient be status post kyphoplasty of T9 and T10 vertebral bodies. IMPRESSION: Fluoroscopic guidance provided during T9 and T10 kyphoplasty. Electronically Signed   By: Marijo Conception M.D.   On: 03/07/2021 11:34   MR Brain W Wo Contrast  Result Date: 03/07/2021 CLINICAL DATA:  Neoplasm surveillance EXAM: MRI HEAD WITHOUT AND WITH CONTRAST TECHNIQUE:  Multiplanar, multiecho pulse sequences of the brain and surrounding structures were obtained without and with intravenous contrast. CONTRAST:  6.88m GADAVIST GADOBUTROL 1 MMOL/ML IV SOLN COMPARISON:  12/07/2020, 12/02/2020 FINDINGS: Brain: Status post right temporoparietal craniotomy with subjacent resection cavity, which has collapsed since the prior exam. Minimal enhancement is noted along the resection margins. Interval decrease in surrounding T2 hyperintense signal. Additional enhancing lesions, with comparison to 12/02/2020: - 3 mm lesion in the anterior left frontal lobe (series 1100, image 231), previously 4 mm. - 6 mm enhancing lesion in the posterior right frontal lobe (series 1100, image 244), previously 6 mm. - 11 x 6 mm lesion in the posterior right parietal lobe (series 1100, image 192), previously 22 x 12 mm. - 2 mm lesion in the left posterior cerebellum (series 1100, image 134), previously 9 x 8 mm. - Previously noted lesion in the medial right cerebellar hemisphere is no longer seen. - Punctate lesion in the left posterior frontal lobe is no longer seen. - Previously noted lesion in the right basal ganglia is no longer seen. No new enhancing lesions. No mass effect or midline shift. No extra-axial fluid collection, acute hemorrhage, acute infarct, or hydrocephalus. Vascular: Normal flow voids. Skull and upper cervical spine: Prior right temporal craniotomy. Bifrontal calvarial lesions have increased in size, now measuring approximately 4.0 x 1.6 cm on the right (series 1100, image 252), previously up to 1.5 cm when remeasured similarly. Abnormal left frontal calvarial enhancement measures up to 4.9 cm (series 1100, image 246), previously up to 2.4 cm. New enhancing lesions in the high right frontal calvarium (series 1100, image 268), measuring up to 1.2 cm, and left frontal calvarium along the left frontal sinus (series 1100, image 206), measuring to 1.2 cm. In the left parietal calvarium, a  previously noted lesion now measures up to 2.9 cm (series 1100, image 227), previously up to 1.5 cm. New  lesions more posteriorly, measuring up to 1.0 cm (series 1100, image 231) and 0.8 cm (series 1100, image 199). New area of enhancement in the right parietal calvarium (series 1100, image 208), measuring up to 0.8 cm. Abnormal focus of enhancement in the left anterior arch of C1 (series 1100, image 103), left occipital condyle (series 1100, image 112), and left aspect of the skull base (series 1100, image 121). Sinuses/Orbits: Negative. Other: None. IMPRESSION: 1. Status post right temporoparietal craniotomy and resection of subjacent lesion, with interval collapse of the resection cavity. Minimal enhancement along the resection margin may be postsurgical, although residual or recurrent tumor cannot be excluded. Attention on follow-up. 2. Additional previously noted enhancing parenchymal lesions are stable or decreased in size; some of them are no longer seen. No new enhancing parenchymal lesions. 3. Interval increase in the size and number of multiple calvarial lesions, including lesions in the left occipital condyle and left anterior arch of C1. Electronically Signed   By: Merilyn Baba M.D.   On: 03/07/2021 15:09   DG C-Arm 1-60 Min-No Report  Result Date: 03/07/2021 CLINICAL DATA:  T9 and T10 kyphoplasties. EXAM: THORACOLUMBAR SPINE 1V Radiation exposure index: 78.61 mGy. COMPARISON:  September 13, 2020. FINDINGS: Four intraoperative fluoroscopic images were obtained of the lower thoracic spine. These demonstrate the patient be status post kyphoplasty of T9 and T10 vertebral bodies. IMPRESSION: Fluoroscopic guidance provided during T9 and T10 kyphoplasty. Electronically Signed   By: Marijo Conception M.D.   On: 03/07/2021 11:34       IMPRESSION/PLAN: 1. 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   Her case was discussed in multidisciplinary brain oncology conference.  She appears to be doing well since her radiosurgery.  She is currently completing a palliative course to her bilateral femurs.  We will continue to follow her closely through the conference and see her back after repeat MRI in approximately 3 months time and given that she did have surgical resection asked that she see Dr Mickeal Skinner as well.  She will continue systemic therapy under the care of Dr. Complete her palliative radiotherapy next week.     Given current concerns for patient exposure during the COVID-19 pandemic, this encounter was conducted via telephone.  The patient has provided two factor identification and has given verbal consent for this type of encounter and has been advised to only accept a meeting of this type in a secure network environment. The time spent during this encounter was 35 minutes including preparation, discussion, and coordination of the patient's care. The attendants for this meeting include  Hayden Pedro  and [employer].  During the encounter,  Hayden Pedro was located at [facility] Radiation Oncology Department.  Yoselin Eidem was located at home.      Carola Rhine, PAC

## 2021-03-11 NOTE — H&P (Signed)
Chief Complaint: Patient was seen in consultation today for [facility] placement at the request of Magrinat,Gustav C  Referring Physician(s): Chauncey Cruel  Supervising Physician: Jacqulynn Cadet  Patient Status: [facility] - Out-pt  History of Present Illness: Monica [name] is a 50 y.o. female with PMHs of triple positive breast cancer who has been followed by Dr. Jana Hakim from oncology.  The original diagnosis was made by biopsy in 2019.  Patient is known to our service for previous liver biopsy on 12/02/2020 which showed adenocarcinoma. Patient complained of progressive back pain in May 2022 and underwent several imaging which showed metastasis to brain, liver, lung, and bones.  Patient underwent craniotomy on 12/06/2020, radial resection of right proximal femur bone tumor and right proximal femur replacement on 01/23/2019,  and T9 kyphoplasty on 03/07/2021.  Patient has had follow-up visits with Dr. Jana Hakim who recommended a systemic chemotherapy to the patient for management of metastatic breast cancer. After thorough discussion and shared decision making, patient decided to proceed with the chemotherapy.  IR was requested for image guided Port-A-Cath placement. Patient laying in bed, not in acute distress.  Denise headache, fever, chills, shortness of breath, cough, chest pain, abdominal pain, nausea ,vomiting, and bleeding.  Past Medical History:  Diagnosis Date   Arthritis    lower back   Cancer [facility])    right breast cancer   Family history of breast cancer    Personal history of chemotherapy    Personal history of radiation therapy     Past Surgical History:  Procedure Laterality Date   ABDOMINAL HYSTERECTOMY     APPLICATION OF CRANIAL NAVIGATION N/A 12/06/2020   Procedure: APPLICATION OF CRANIAL NAVIGATION;  Surgeon: Judith Part, MD;  Location: [place];  Service: Neurosurgery;  Laterality: N/A;  RM 20   BREAST LUMPECTOMY Right    BREAST LUMPECTOMY WITH RADIOACTIVE  SEED AND SENTINEL LYMPH NODE BIOPSY Right 02/16/2018   Procedure: RIGHT BREAST LUMPECTOMY WITH RADIOACTIVE SEED AND SENTINEL LYMPH NODE BIOPSY;  Surgeon: [name], MD;  Location: [place];  Service: General;  Laterality: Right;   CRANIOTOMY Right 12/06/2020   Procedure: Right sided awake craniotomy for tumor resection;  Surgeon: Judith Part, MD;  Location: [place];  Service: Neurosurgery;  Laterality: Right;   HYMENECTOMY     IR US GUIDE BX ASP/DRAIN  12/02/2020   KYPHOPLASTY N/A 03/07/2021   Procedure: Thoracic nine, Thoracic ten KYPHOPLASTY;  Surgeon: Judith Part, MD;  Location: [place];  Service: Neurosurgery;  Laterality: N/A;   PORTACATH PLACEMENT Right 02/16/2018   Procedure: INSERTION PORT-A-CATH;  Surgeon: [name], MD;  Location: [place];  Service: General;  Laterality: Right;   RE-EXCISION OF BREAST LUMPECTOMY Right 02/22/2018   Procedure: RE-EXCISION OF RIGHT  BREAST LUMPECTOMY;  Surgeon: [name], MD;  Location: [place];  Service: General;  Laterality: Right;   REPAIR VAGINAL CUFF N/A 02/07/2017   Procedure: REPAIR VAGINAL CUFF;  Surgeon: Ena Dawley, MD;  Location: [place] [facility];  Service: Gynecology;  Laterality: N/A;   ROBOTIC ASSISTED TOTAL HYSTERECTOMY WITH SALPINGECTOMY Left 01/20/2017   Procedure: ROBOTIC ASSISTED TOTAL HYSTERECTOMY WITH SALPINGECTOMY;  Surgeon: [name], MD;  Location: [place] [facility];  Service: Gynecology;  Laterality: Left;   TOTAL HIP ARTHROPLASTY Right 2022   total hip and partial femur, per pt    Allergies: Patient has no known allergies.  Medications: Prior to Admission medications   Medication Sig Start Date End Date Taking? Authorizing Provider  acetaminophen (  TYLENOL) 500 MG tablet Take 1 tablet (500 mg total) by mouth 3 (three) times daily as needed for mild pain (or Fever >/= 101). Take with ibuprofen 400 mg 02/10/21   Magrinat, [name], MD  b complex vitamins capsule  Take 1 capsule by mouth daily. 900 mg    [provider]  capecitabine (XELODA) 500 MG tablet Take 1,500 mg by mouth 3 (three) times daily. 02/25/21   [provider]  Cholecalciferol (VITAMIN D) 125 MCG (5000 UT) CAPS Take 5,000 Units by mouth daily. 06/20/20   Magrinat, [name], MD  DULoxetine (CYMBALTA) 20 MG capsule TAKE 1 CAPSULE BY MOUTH EVERY DAY 03/04/21   Magrinat, [name], MD  ferrous sulfate 325 (65 FE) MG tablet Take 325 mg by mouth daily with breakfast.    [provider]  fluconazole (DIFLUCAN) 100 MG tablet Take 100 mg by mouth daily.    [provider]  gabapentin (NEURONTIN) 300 MG capsule Take 1 capsule (300 mg total) by mouth 3 (three) times daily. 02/13/21   Magrinat, [name], MD  ibuprofen (ADVIL) 400 MG tablet Take 1 tablet (400 mg total) by mouth 3 (three) times daily. Take with tylenol 500 mg Patient taking differently: Take 400 mg by mouth 3 (three) times daily as needed for mild pain or moderate pain. 02/13/21   Magrinat, [name], MD  lidocaine (LIDODERM) 5 % Place 1 patch onto the skin daily as needed (for pain- Remove & Discard patch within 12 hours or as directed by MD). 01/21/21   Eugenie Filler, MD  ondansetron (ZOFRAN ODT) 4 MG disintegrating tablet Take 1 tablet (4 mg total) by mouth See admin instructions. Dissolve 4 mg in the mouth three times a day and an additional 4 mg at bedtime as needed for nausea/vomiting 01/21/21   Eugenie Filler, MD  pantoprazole (PROTONIX) 40 MG tablet Take 40 mg by mouth daily. Patient not taking: Reported on 03/07/2021 02/06/21   [provider]  polyethylene glycol (MIRALAX / GLYCOLAX) 17 g packet Take 17 g by mouth daily as needed for moderate constipation. 01/21/21   Eugenie Filler, MD  prochlorperazine (COMPAZINE) 10 MG tablet Take 1 tablet (10 mg total) by mouth every 6 (six) hours. Patient taking differently: Take 10 mg by mouth 3 (three) times daily. 02/18/21   Magrinat, [name], MD   pyridOXINE (VITAMIN B-6) 100 MG tablet Take 100 mg by mouth daily.    [provider]  senna-docusate (SENOKOT-S) 8.6-50 MG tablet Take 2 tablets by mouth 2 (two) times daily. 01/21/21   Eugenie Filler, MD  traMADol (ULTRAM) 50 MG tablet Take 1 tablet (50 mg total) by mouth every 6 (six) hours as needed. 02/10/21   Magrinat, [name], MD  traZODone (DESYREL) 50 MG tablet Take 1 tablet (50 mg total) by mouth at bedtime as needed for sleep. Patient not taking: No sig reported 01/21/21   Eugenie Filler, MD  TUKYSA 150 MG tablet Take 150 mg by mouth 2 (two) times daily. 02/26/21   [provider]     Family History  Problem Relation Age of Onset   Lung cancer Maternal Grandfather    Esophageal cancer Paternal Grandfather 50   Breast cancer Cousin 63    Social History   Socioeconomic History   Marital status: Legally Separated    Spouse name: Not on file   Number of children: Not on file   Years of education: Not on file  Highest education level: Not on file  Occupational History   Not on file  Tobacco Use   Smoking status: Never   Smokeless tobacco: Never  Vaping Use   Vaping Use: Former  Substance and Sexual Activity   Alcohol use: Not Currently   Drug use: No   Sexual activity: Not Currently    Birth control/protection: Surgical  Other Topics Concern   Not on file  Social History Narrative   Not on file   Social Determinants of Health   Financial Resource Strain: Not on file  Food Insecurity: Not on file  Transportation Needs: Not on file  Physical Activity: Not on file  Stress: Not on file  Social Connections: Not on file     Review of Systems: A 12 point ROS discussed and pertinent positives are indicated in the HPI above.  All other systems are negative.   Vital Signs: BP 131/88   Pulse 91   Temp 99.1 F (37.3 C) (Oral)   Resp 17   Ht '5\' 8"'$  (1.727 m)   Wt 162 lb (73.5 kg)   LMP 12/21/2016 (Exact Date)   SpO2 97%   BMI 24.63  kg/m   Physical Exam Vitals and nursing note reviewed.  Constitutional:      General: Patient is not in acute distress.    Appearance: Normal appearance. Patient is not ill-appearing.  HENT:     Head: Normocephalic and atraumatic.     Mouth/Throat:     Mouth: Mucous membranes are moist.     Pharynx: Oropharynx is clear.  Cardiovascular:     Rate and Rhythm: Normal rate and regular rhythm.     Pulses: Normal pulses.     Heart sounds: Normal heart sounds.  Pulmonary:     Effort: Pulmonary effort is normal.     Breath sounds: Normal breath sounds.  Abdominal:     General: Abdomen is flat. Bowel sounds are normal.     Palpations: Abdomen is soft.  Musculoskeletal:     Cervical back: Neck supple.  Skin:    General: Skin is warm and dry.     Coloration: Skin is not jaundiced or pale.  Neurological:     Mental Status: Patient is alert and oriented to person, place, and time.  Psychiatric:        Mood and Affect: Mood normal.        Behavior: Behavior normal.        Judgment: Judgment normal.    MD Evaluation Airway: WNL Heart: WNL Abdomen: WNL Chest/ Lungs: WNL ASA  Classification: 3 Mallampati/Airway Score: Two  Imaging: DG THORACOLUMABAR SPINE  Result Date: 03/07/2021 CLINICAL DATA:  T9 and T10 kyphoplasties. EXAM: THORACOLUMBAR SPINE 1V Radiation exposure index: 78.61 mGy. COMPARISON:  September 13, 2020. FINDINGS: Four intraoperative fluoroscopic images were obtained of the lower thoracic spine. These demonstrate the patient be status post kyphoplasty of T9 and T10 vertebral bodies. IMPRESSION: Fluoroscopic guidance provided during T9 and T10 kyphoplasty. Electronically Signed   By: Marijo Conception M.D.   On: 03/07/2021 11:34   MR Brain W Wo Contrast  Result Date: 03/07/2021 CLINICAL DATA:  Neoplasm surveillance EXAM: MRI HEAD WITHOUT AND WITH CONTRAST TECHNIQUE: Multiplanar, multiecho pulse sequences of the brain and surrounding structures were obtained without and with  intravenous contrast. CONTRAST:  6.39m GADAVIST GADOBUTROL 1 MMOL/ML IV SOLN COMPARISON:  12/07/2020, 12/02/2020 FINDINGS: Brain: Status post right temporoparietal craniotomy with subjacent resection cavity, which has collapsed since the prior exam. Minimal enhancement  is noted along the resection margins. Interval decrease in surrounding T2 hyperintense signal. Additional enhancing lesions, with comparison to 12/02/2020: - 3 mm lesion in the anterior left frontal lobe (series 1100, image 231), previously 4 mm. - 6 mm enhancing lesion in the posterior right frontal lobe (series 1100, image 244), previously 6 mm. - 11 x 6 mm lesion in the posterior right parietal lobe (series 1100, image 192), previously 22 x 12 mm. - 2 mm lesion in the left posterior cerebellum (series 1100, image 134), previously 9 x 8 mm. - Previously noted lesion in the medial right cerebellar hemisphere is no longer seen. - Punctate lesion in the left posterior frontal lobe is no longer seen. - Previously noted lesion in the right basal ganglia is no longer seen. No new enhancing lesions. No mass effect or midline shift. No extra-axial fluid collection, acute hemorrhage, acute infarct, or hydrocephalus. Vascular: Normal flow voids. Skull and upper cervical spine: Prior right temporal craniotomy. Bifrontal calvarial lesions have increased in size, now measuring approximately 4.0 x 1.6 cm on the right (series 1100, image 252), previously up to 1.5 cm when remeasured similarly. Abnormal left frontal calvarial enhancement measures up to 4.9 cm (series 1100, image 246), previously up to 2.4 cm. New enhancing lesions in the high right frontal calvarium (series 1100, image 268), measuring up to 1.2 cm, and left frontal calvarium along the left frontal sinus (series 1100, image 206), measuring to 1.2 cm. In the left parietal calvarium, a previously noted lesion now measures up to 2.9 cm (series 1100, image 227), previously up to 1.5 cm. New lesions more  posteriorly, measuring up to 1.0 cm (series 1100, image 231) and 0.8 cm (series 1100, image 199). New area of enhancement in the right parietal calvarium (series 1100, image 208), measuring up to 0.8 cm. Abnormal focus of enhancement in the left anterior arch of C1 (series 1100, image 103), left occipital condyle (series 1100, image 112), and left aspect of the skull base (series 1100, image 121). Sinuses/Orbits: Negative. Other: None. IMPRESSION: 1. Status post right temporoparietal craniotomy and resection of subjacent lesion, with interval collapse of the resection cavity. Minimal enhancement along the resection margin may be postsurgical, although residual or recurrent tumor cannot be excluded. Attention on follow-up. 2. Additional previously noted enhancing parenchymal lesions are stable or decreased in size; some of them are no longer seen. No new enhancing parenchymal lesions. 3. Interval increase in the size and number of multiple calvarial lesions, including lesions in the left occipital condyle and left anterior arch of C1. Electronically Signed   By: Merilyn Baba M.D.   On: 03/07/2021 15:09   DG C-Arm 1-60 Min-No Report  Result Date: 03/07/2021 CLINICAL DATA:  T9 and T10 kyphoplasties. EXAM: THORACOLUMBAR SPINE 1V Radiation exposure index: 78.61 mGy. COMPARISON:  September 13, 2020. FINDINGS: Four intraoperative fluoroscopic images were obtained of the lower thoracic spine. These demonstrate the patient be status post kyphoplasty of T9 and T10 vertebral bodies. IMPRESSION: Fluoroscopic guidance provided during T9 and T10 kyphoplasty. Electronically Signed   By: Marijo Conception M.D.   On: 03/07/2021 11:34    Labs:  CBC: Recent Labs    01/20/21 0507 01/21/21 0504 02/10/21 0946 03/04/21 1037  WBC 4.2 5.0 6.6 5.6  HGB 9.4* 9.4* 9.9* 10.5*  HCT 29.4* 29.5* 30.8* 33.5*  PLT 143* 143* 315 255    COAGS: Recent Labs    12/02/20 0612 12/06/20 0254  INR 1.0 1.0    BMP: Recent  Labs     01/20/21 0507 01/21/21 0504 02/10/21 0946 03/04/21 1037  NA 138 140 138 139  K 3.6 3.8 3.9 4.1  CL 107 109 101 108  CO2 '25 26 26 22  '$ GLUCOSE 88 93 114* 103*  BUN '8 9 9 12  '$ CALCIUM 7.6* 8.1* 11.0* 9.0  CREATININE 0.53 0.63 0.68 0.70  GFRNONAA >60 >60 >60 >60    LIVER FUNCTION TESTS: Recent Labs    01/07/21 1216 01/14/21 1255 01/17/21 1014 01/20/21 0507 01/21/21 0504 02/10/21 0946 03/04/21 1037  BILITOT 0.3 0.8  --   --   --  0.6 0.5  AST 85* 82*  --   --   --  69* 62*  ALT 91* 73*  --   --   --  25 31  ALKPHOS 188* 166*  --   --   --  220* 264*  PROT 7.2 7.3  --   --   --  6.7 6.9  ALBUMIN 3.2* 3.8   < > 2.6* 2.7* 3.0* 3.7   < > = values in this interval not displayed.    TUMOR MARKERS: No results for input(s): AFPTM, CEA, CA199, CHROMGRNA in the last 8760 hours.  Assessment and Plan: 50 y.o. female with metastatic triple positive breast cancer who has been followed by oncology since 2019.  During follow-up visit with Dr. Jana Hakim, a systemic chemotherapy was recommended to the patient for further management of the metastatic breast cancer.  After thorough discussion and shared decision making, patient decided to proceed with the chemotherapy.  IR was requested for image guided Port-A-Cath placement. Patient presents to [facility] IR for the procedure. N.p.o. since midnight VSS Not on AC/AP treatment  Risks and benefits of image guided port-a-catheter placement was discussed with the patient including, but not limited to bleeding, infection, pneumothorax, or fibrin sheath development and need for additional procedures.  All of the patient's questions were answered, patient is agreeable to proceed. Consent signed and in chart.   Thank you for this interesting consult.  I greatly enjoyed meeting Monica [name] and look forward to participating in their care.  A copy of this report was sent to the requesting provider on this date.  Electronically Signed: Tera Mater,  PA-C 03/12/2021, 10:10 AM   I spent a total of    25 Minutes in face to face in clinical consultation, greater than 50% of which was counseling/coordinating care for Port-A-Cath placement.

## 2021-03-11 NOTE — Progress Notes (Signed)
When I talked the patient today on the telephone when we were reviewing her MRI scan she reports that she has been having a rash for several days in the gluteal cleft just above the rectal region.  She is currently receiving radiation to the bilateral femurs and her treatment has been reviewed by Dr. [name] and is not overlapping this location.  She did previously receive palliative radiotherapy to the sacrum which was completed in June 2022.  She is currently receiving tyrosine kinase inhibitor and anti-HER2 therapy.  Images are below when I saw her at the treatment machine.  It does appear that there is some dry disclamation versus skin avulsion.  No superinfection or Candida is identified.  We discussed the use of topical Silvadene, this will continue to be followed expectantly but could possibly be some type of radiation recall rash.  Dr. [name] will see her on Friday and continue to follow this.      Carola Rhine, PAC

## 2021-03-12 ENCOUNTER — Other Ambulatory Visit: Payer: Self-pay | Admitting: Oncology

## 2021-03-12 ENCOUNTER — Telehealth: Payer: Self-pay | Admitting: *Deleted

## 2021-03-12 ENCOUNTER — Ambulatory Visit: Payer: No Typology Code available for payment source | Admitting: Oncology

## 2021-03-12 ENCOUNTER — Ambulatory Visit (HOSPITAL_COMMUNITY)
Admission: RE | Admit: 2021-03-12 | Discharge: 2021-03-12 | Disposition: A | Discharge status: Home / Self Care | Payer: 59 | Source: Ambulatory Visit | Attending: Oncology | Admitting: Oncology

## 2021-03-12 ENCOUNTER — Encounter (HOSPITAL_COMMUNITY): Payer: Self-pay

## 2021-03-12 ENCOUNTER — Other Ambulatory Visit: Payer: Self-pay

## 2021-03-12 ENCOUNTER — Ambulatory Visit
Admission: RE | Admit: 2021-03-12 | Discharge: 2021-03-12 | Disposition: A | Discharge status: Home / Self Care | Payer: 59 | Source: Ambulatory Visit | Attending: Radiation Oncology | Admitting: Radiation Oncology

## 2021-03-12 DIAGNOSIS — C787 Secondary malignant neoplasm of liver and intrahepatic bile duct: Secondary | ICD-10-CM | POA: Diagnosis not present

## 2021-03-12 DIAGNOSIS — C7931 Secondary malignant neoplasm of brain: Secondary | ICD-10-CM | POA: Diagnosis not present

## 2021-03-12 DIAGNOSIS — C50211 Malignant neoplasm of upper-inner quadrant of right female breast: Secondary | ICD-10-CM

## 2021-03-12 DIAGNOSIS — C7951 Secondary malignant neoplasm of bone: Secondary | ICD-10-CM

## 2021-03-12 DIAGNOSIS — C7802 Secondary malignant neoplasm of left lung: Secondary | ICD-10-CM | POA: Diagnosis not present

## 2021-03-12 DIAGNOSIS — Z79899 Other long term (current) drug therapy: Secondary | ICD-10-CM | POA: Diagnosis not present

## 2021-03-12 DIAGNOSIS — C7801 Secondary malignant neoplasm of right lung: Secondary | ICD-10-CM

## 2021-03-12 DIAGNOSIS — C7989 Secondary malignant neoplasm of other specified sites: Secondary | ICD-10-CM

## 2021-03-12 DIAGNOSIS — Z170 Estrogen receptor positive status [ER+]: Secondary | ICD-10-CM

## 2021-03-12 HISTORY — PX: IR IMAGING GUIDED PORT INSERTION: IMG5740

## 2021-03-12 IMAGING — XA IR IMAGING GUIDED PORT INSERTION
2 series · 2 of 2 positions shown · non-contrast
Comparison: none

INDICATION: Metastatic breast cancer. Patient presents for placement of port
catheter to facilitate chemotherapy.

[Series 1: ir fluoro/shunt/fist · 1 of 1 slices shown]
[im 1/1]
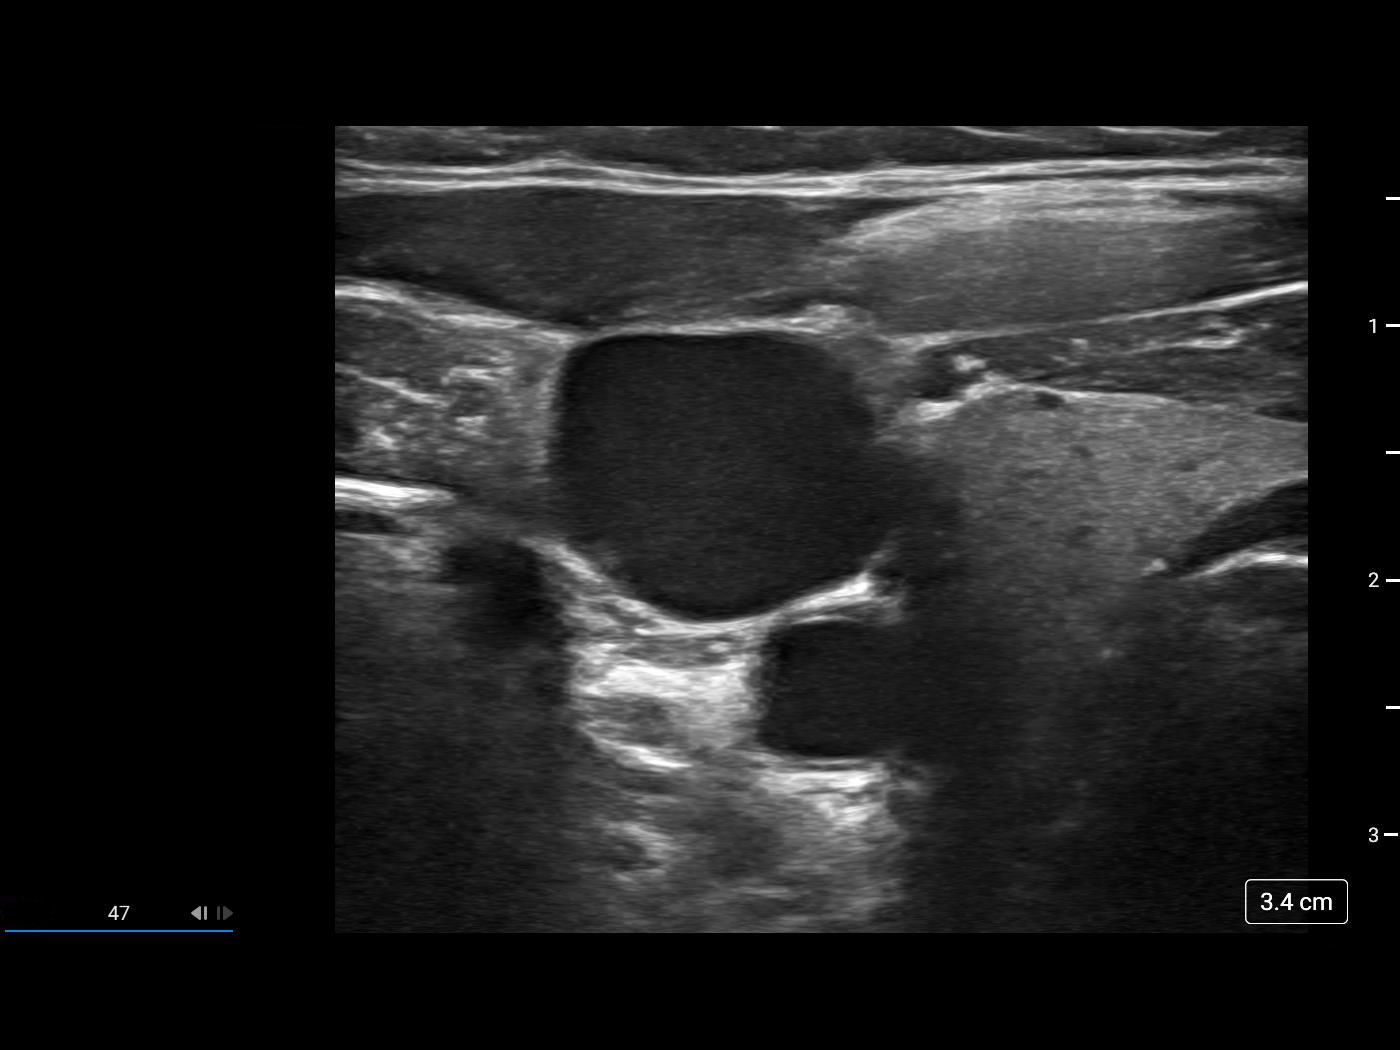

[Series 300: line placements · 1 of 1 slices shown]
[im 1/1]
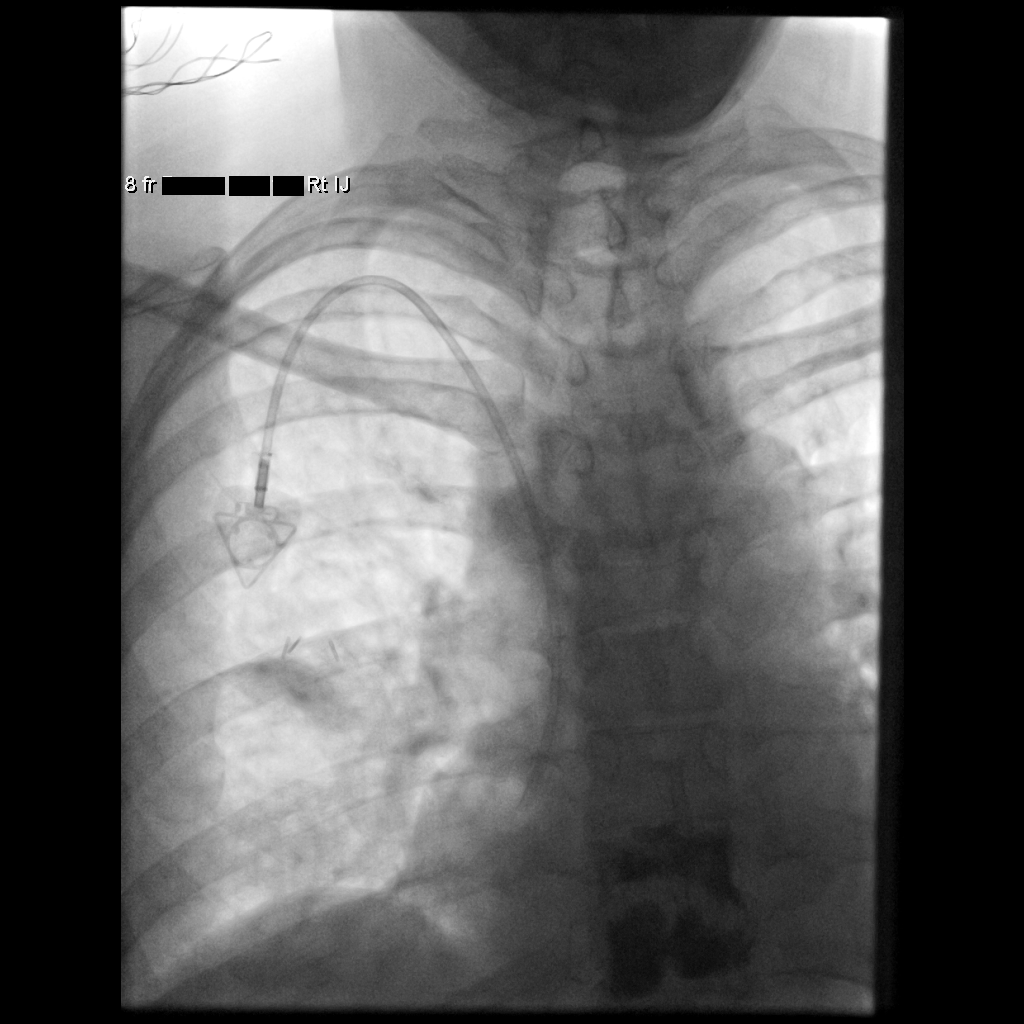

[2 of 2 positions shown; findings below may reference images not displayed]

EXAM:
IMPLANTED PORT A CATH PLACEMENT WITH ULTRASOUND AND FLUOROSCOPIC
GUIDANCE

MEDICATIONS:
None.

ANESTHESIA/SEDATION:
Versed 2 mg IV; Fentanyl 100 mcg IV;

Moderate Sedation Time:  20 minutes

The patient was continuously monitored during the procedure by the
interventional radiology nurse under my direct supervision.

FLUOROSCOPY TIME:  0 minutes, 30 seconds (4 mGy)

COMPLICATIONS:
None immediate.

PROCEDURE:
The right neck and chest was prepped with chlorhexidine, and draped
in the usual sterile fashion using maximum barrier technique (cap
and mask, sterile gown, sterile gloves, large sterile sheet, hand
hygiene and cutaneous antiseptic). Local anesthesia was attained by
infiltration with 1% lidocaine with epinephrine.

Ultrasound demonstrated patency of the right internal jugular vein,
and this was documented with an image. Under real-time ultrasound
guidance, this vein was accessed with a 21 gauge micropuncture
needle and image documentation was performed. A small dermatotomy
was made at the access site with an 11 scalpel. A 0.018" wire was
advanced into the SVC and the access needle exchanged for a 4F
micropuncture vascular sheath. The 0.018" wire was then removed and
a 0.035" wire advanced into the IVC.



The venous access site was then serially dilated and a peel away
vascular sheath placed over the wire. The wire was removed and the
port catheter advanced into position under fluoroscopic guidance.
The catheter tip is positioned in the superior cavoatrial junction.
This was documented with a spot image. The portacatheter was then
tested and found to flush and aspirate well. The port was flushed
with saline followed by 100 units/mL heparinized saline.

The pocket was then closed using subdermal inverted interrupted
absorbable sutures followed by Dermabond. The dermatotomy at the
venous access site was also closed with Dermabond.
IMPRESSION: Successful placement of a right IJ approach Power Port with
ultrasound and fluoroscopic guidance. The catheter is ready for use.

## 2021-03-12 MED ORDER — MIDAZOLAM HCL 2 MG/2ML IJ SOLN
INTRAMUSCULAR | Status: AC | PRN
  Administered 2021-03-12 (×2): 1 mg via INTRAVENOUS

## 2021-03-12 MED ORDER — MIDAZOLAM HCL 2 MG/2ML IJ SOLN
INTRAMUSCULAR | Status: AC
  Filled 2021-03-12: qty 4

## 2021-03-12 MED ORDER — FENTANYL CITRATE (PF) 100 MCG/2ML IJ SOLN
INTRAMUSCULAR | Status: AC
  Filled 2021-03-12: qty 2

## 2021-03-12 MED ORDER — LIDOCAINE-EPINEPHRINE 1 %-1:100000 IJ SOLN
INTRAMUSCULAR | Status: AC | PRN
  Administered 2021-03-12 (×2): 10 mL via INTRADERMAL

## 2021-03-12 MED ORDER — HEPARIN SOD (PORK) LOCK FLUSH 100 UNIT/ML IV SOLN
INTRAVENOUS | Status: AC
  Filled 2021-03-12: qty 5

## 2021-03-12 MED ORDER — FENTANYL CITRATE (PF) 100 MCG/2ML IJ SOLN
INTRAMUSCULAR | Status: AC | PRN
  Administered 2021-03-12 (×2): 50 ug via INTRAVENOUS

## 2021-03-12 MED ORDER — HEPARIN SOD (PORK) LOCK FLUSH 100 UNIT/ML IV SOLN
INTRAVENOUS | Status: AC | PRN
  Administered 2021-03-12: 500 [IU] via INTRAVENOUS

## 2021-03-12 MED ORDER — LIDOCAINE-EPINEPHRINE 1 %-1:100000 IJ SOLN
INTRAMUSCULAR | Status: AC
  Filled 2021-03-12: qty 1

## 2021-03-12 MED ORDER — SODIUM CHLORIDE 0.9 % IV SOLN: INTRAVENOUS | Status: DC

## 2021-03-12 NOTE — Procedures (Signed)
Interventional Radiology Procedure Note  Procedure: Placement of a right IJ approach single lumen PowerPort.  Tip is positioned at the superior cavoatrial junction and catheter is ready for immediate use.  Complications: No immediate Recommendations:  - Ok to shower tomorrow - Do not submerge for 7 days - Routine line care   Signed,  Finesse Fielder K. Divine Hansley, MD   

## 2021-03-12 NOTE — Discharge Instructions (Signed)
Interventional radiology phone numbers °[phone] °After hours [phone] ° ° ° °You have skin glue (dermabond) over your new port. Do not use the lidocaine cream (EMLA cream) over the skin glue until it has healed. The petroleum in the lidocaine cream will dissolve the skin glue resulting in an infection of your new port. Use ice in a zip lock bag for 1-2 minutes over your new port before the cancer center nurses access your port. ° ° °Implanted Port Insertion, Care After °This sheet gives you information about how to care for yourself after your procedure. Your health care provider may also give you more specific instructions. If you have problems or questions, contact your health care provider. °What can I expect after the procedure? °After the procedure, it is common to have: °Discomfort at the port insertion site. °Bruising on the skin over the port. This should improve over 3-4 days. °Follow these instructions at home: °Port care °After your port is placed, you will get a manufacturer's information card. The card has information about your port. Keep this card with you at all times. °Take care of the port as told by your health care provider. Ask your health care provider if you or a family member can get training for taking care of the port at home. A home health care nurse may also take care of the port. °Make sure to remember what type of port you have. °Incision care °Follow instructions from your health care provider about how to take care of your port insertion site. Make sure you: °Wash your hands with soap and water before and after you change your bandage (dressing). If soap and water are not available, use hand sanitizer. °Change your dressing as told by your health care provider. °Leave skin glue in place. These skin closures may need to stay in place for 2 weeks or longer.  °Check your port insertion site every day for signs of infection. Check for: °Redness, swelling, or pain. °Fluid or  blood. °Warmth. °Pus or a bad smell.  °  °  °Activity °Return to your normal activities as told by your health care provider. Ask your health care provider what activities are safe for you. °Do not lift anything that is heavier than 10 lb (4.5 kg), or the limit that you are told, until your health care provider says that it is safe. °General instructions °Take over-the-counter and prescription medicines only as told by your health care provider. °Do not take baths, swim, or use a hot tub until your health care provider approves.You may remove your dressing tomorrow and shower 24 hours after your procedure. °Do not drive for 24 hours if you were given a sedative during your procedure. °Wear a medical alert bracelet in case of an emergency. This will tell any health care providers that you have a port. °Keep all follow-up visits as told by your health care provider. This is important. °Contact a health care provider if: °You cannot flush your port with saline as directed, or you cannot draw blood from the port. °You have a fever or chills. °You have redness, swelling, or pain around your port insertion site. °You have fluid or blood coming from your port insertion site. °Your port insertion site feels warm to the touch. °You have pus or a bad smell coming from the port insertion site. °Get help right away if: °You have chest pain or shortness of breath. °You have bleeding from your port that you cannot control. °Summary °Take care of   the port as told by your health care provider. Keep the manufacturer's information card with you at all times. °Change your dressing as told by your health care provider. °Contact a health care provider if you have a fever or chills or if you have redness, swelling, or pain around your port insertion site. °Keep all follow-up visits as told by your health care provider. °This information is not intended to replace advice given to you by your health care provider. Make sure you discuss any  questions you have with your health care provider. °Document Revised: 01/25/2018 Document Reviewed: 01/25/2018 °Elsevier Patient Education © 2021 [employer] ° ° ° °Moderate Conscious Sedation, Adult, Care After °This sheet gives you information about how to care for yourself after your procedure. Your health care provider may also give you more specific instructions. If you have problems or questions, contact your health care provider. °What can I expect after the procedure? °After the procedure, it is common to have: °Sleepiness for several hours. °Impaired judgment for several hours. °Difficulty with balance. °Vomiting if you eat too soon. °Follow these instructions at home: °For the time period you were told by your health care provider: °Rest. °Do not participate in activities where you could fall or become injured. °Do not drive or use machinery. °Do not drink alcohol. °Do not take sleeping pills or medicines that cause drowsiness. °Do not make important decisions or sign legal documents. °Do not take care of children on your own.  °  °  °Eating and drinking °Follow the diet recommended by your health care provider. °Drink enough fluid to keep your urine pale yellow. °If you vomit: °Drink water, juice, or soup when you can drink without vomiting. °Make sure you have little or no nausea before eating solid foods.   °General instructions °Take over-the-counter and prescription medicines only as told by your health care provider. °Have a responsible adult stay with you for the time you are told. It is important to have someone help care for you until you are awake and alert. °Do not smoke. °Keep all follow-up visits as told by your health care provider. This is important. °Contact a health care provider if: °You are still sleepy or having trouble with balance after 24 hours. °You feel light-headed. °You keep feeling nauseous or you keep vomiting. °You develop a rash. °You have a fever. °You have redness or  swelling around the IV site. °Get help right away if: °You have trouble breathing. °You have new-onset confusion at home. °Summary °After the procedure, it is common to feel sleepy, have impaired judgment, or feel nauseous if you eat too soon. °Rest after you get home. Know the things you should not do after the procedure. °Follow the diet recommended by your health care provider and drink enough fluid to keep your urine pale yellow. °Get help right away if you have trouble breathing or new-onset confusion at home. °This information is not intended to replace advice given to you by your health care provider. Make sure you discuss any questions you have with your health care provider. °Document Revised: 10/27/2019 Document Reviewed: 05/25/2019 °Elsevier Patient Education © 2021 [employer]  °

## 2021-03-12 NOTE — Progress Notes (Signed)
Patient stayed until 1420 resting as she has radiation oncology appt at 1430

## 2021-03-13 ENCOUNTER — Ambulatory Visit
Admission: RE | Admit: 2021-03-13 | Discharge: 2021-03-13 | Disposition: A | Discharge status: Home / Self Care | Payer: 59 | Source: Ambulatory Visit | Attending: Radiation Oncology | Admitting: Radiation Oncology

## 2021-03-13 DIAGNOSIS — C7949 Secondary malignant neoplasm of other parts of nervous system: Secondary | ICD-10-CM | POA: Insufficient documentation

## 2021-03-13 DIAGNOSIS — C7951 Secondary malignant neoplasm of bone: Secondary | ICD-10-CM | POA: Diagnosis present

## 2021-03-13 DIAGNOSIS — Z79899 Other long term (current) drug therapy: Secondary | ICD-10-CM | POA: Insufficient documentation

## 2021-03-13 DIAGNOSIS — Z20822 Contact with and (suspected) exposure to covid-19: Secondary | ICD-10-CM | POA: Diagnosis not present

## 2021-03-13 DIAGNOSIS — Z5112 Encounter for antineoplastic immunotherapy: Secondary | ICD-10-CM | POA: Insufficient documentation

## 2021-03-13 DIAGNOSIS — Z170 Estrogen receptor positive status [ER+]: Secondary | ICD-10-CM | POA: Insufficient documentation

## 2021-03-13 DIAGNOSIS — C50211 Malignant neoplasm of upper-inner quadrant of right female breast: Secondary | ICD-10-CM | POA: Insufficient documentation

## 2021-03-13 DIAGNOSIS — C7931 Secondary malignant neoplasm of brain: Secondary | ICD-10-CM | POA: Diagnosis not present

## 2021-03-13 DIAGNOSIS — Z510 Encounter for antineoplastic radiation therapy: Secondary | ICD-10-CM | POA: Insufficient documentation

## 2021-03-14 ENCOUNTER — Other Ambulatory Visit: Payer: Self-pay | Admitting: Radiation Therapy

## 2021-03-14 ENCOUNTER — Encounter: Payer: Self-pay | Admitting: Oncology

## 2021-03-14 ENCOUNTER — Other Ambulatory Visit: Payer: Self-pay

## 2021-03-14 ENCOUNTER — Ambulatory Visit
Admission: RE | Admit: 2021-03-14 | Discharge: 2021-03-14 | Disposition: A | Discharge status: Home / Self Care | Payer: 59 | Source: Ambulatory Visit | Attending: Radiation Oncology | Admitting: Radiation Oncology

## 2021-03-14 DIAGNOSIS — C7931 Secondary malignant neoplasm of brain: Secondary | ICD-10-CM

## 2021-03-14 DIAGNOSIS — C7949 Secondary malignant neoplasm of other parts of nervous system: Secondary | ICD-10-CM

## 2021-03-14 DIAGNOSIS — Z5112 Encounter for antineoplastic immunotherapy: Secondary | ICD-10-CM | POA: Diagnosis not present

## 2021-03-14 NOTE — Progress Notes (Signed)
RN successfully faxed progress notes/demographics to patient's nurse case manager, Carmel [place].  [phone]

## 2021-03-18 ENCOUNTER — Other Ambulatory Visit: Payer: No Typology Code available for payment source

## 2021-03-18 ENCOUNTER — Ambulatory Visit
Admission: RE | Admit: 2021-03-18 | Discharge: 2021-03-18 | Disposition: A | Discharge status: Home / Self Care | Payer: 59 | Source: Ambulatory Visit | Attending: Radiation Oncology | Admitting: Radiation Oncology

## 2021-03-18 ENCOUNTER — Other Ambulatory Visit: Payer: Self-pay

## 2021-03-18 DIAGNOSIS — Z5112 Encounter for antineoplastic immunotherapy: Secondary | ICD-10-CM | POA: Diagnosis not present

## 2021-03-19 ENCOUNTER — Other Ambulatory Visit: Payer: Self-pay | Admitting: *Deleted

## 2021-03-19 ENCOUNTER — Ambulatory Visit
Admission: RE | Admit: 2021-03-19 | Discharge: 2021-03-19 | Disposition: A | Discharge status: Home / Self Care | Payer: 59 | Source: Ambulatory Visit | Attending: Radiation Oncology | Admitting: Radiation Oncology

## 2021-03-19 ENCOUNTER — Encounter: Payer: Self-pay | Admitting: Oncology

## 2021-03-19 DIAGNOSIS — Z5112 Encounter for antineoplastic immunotherapy: Secondary | ICD-10-CM | POA: Diagnosis not present

## 2021-03-19 MED ORDER — ONDANSETRON 4 MG PO TBDP: 4.0000 mg | ORAL_TABLET | ORAL | 3 refills | Status: DC

## 2021-03-19 NOTE — Progress Notes (Signed)
..  Patient Assist/Replace for the following has been terminated. Medication: Xgeva (denosumab) Reason for Termination: Medicaid starting 03/06/2021. Last DOS: 02/10/2021. Marland KitchenJuan Quam, CPhT IV Drug Replacement Specialist [place] Phone: [phone]

## 2021-03-19 NOTE — Progress Notes (Signed)
..  Patient Assist/Replace for the following has been terminated. Medication: Ogivri (trastuzumab-dkst) Reason for Termination: Medicaid starting 03/06/2021. Last DOS: 03/04/2021. Marland KitchenJuan Quam, CPhT IV Drug Replacement Specialist [place] Phone: [phone]

## 2021-03-20 ENCOUNTER — Encounter: Payer: Self-pay | Admitting: Oncology

## 2021-03-20 ENCOUNTER — Ambulatory Visit: Payer: Self-pay | Admitting: Oncology

## 2021-03-20 ENCOUNTER — Ambulatory Visit: Payer: 59

## 2021-03-20 ENCOUNTER — Ambulatory Visit
Admission: RE | Admit: 2021-03-20 | Discharge: 2021-03-20 | Disposition: A | Discharge status: Home / Self Care | Payer: 59 | Source: Ambulatory Visit | Attending: Radiation Oncology | Admitting: Radiation Oncology

## 2021-03-20 ENCOUNTER — Other Ambulatory Visit: Payer: No Typology Code available for payment source

## 2021-03-20 ENCOUNTER — Other Ambulatory Visit: Payer: Self-pay

## 2021-03-20 ENCOUNTER — Ambulatory Visit: Payer: No Typology Code available for payment source

## 2021-03-20 ENCOUNTER — Telehealth: Payer: Self-pay | Admitting: Radiation Oncology

## 2021-03-20 DIAGNOSIS — Z5112 Encounter for antineoplastic immunotherapy: Secondary | ICD-10-CM | POA: Diagnosis not present

## 2021-03-20 DIAGNOSIS — Z6825 Body mass index (BMI) 25.0-25.9, adult: Secondary | ICD-10-CM | POA: Insufficient documentation

## 2021-03-20 NOTE — Telephone Encounter (Signed)
I called the patient and she is still having changes of her skin. Fortunately she is still using silvadene. She will see Dr. [name] tomorrow but denies any pain in her skin.

## 2021-03-21 ENCOUNTER — Encounter: Payer: Self-pay | Admitting: Radiation Oncology

## 2021-03-21 ENCOUNTER — Ambulatory Visit
Admission: RE | Admit: 2021-03-21 | Discharge: 2021-03-21 | Disposition: A | Discharge status: Home / Self Care | Payer: 59 | Source: Ambulatory Visit | Attending: Radiation Oncology | Admitting: Radiation Oncology

## 2021-03-21 DIAGNOSIS — Z5112 Encounter for antineoplastic immunotherapy: Secondary | ICD-10-CM | POA: Diagnosis not present

## 2021-03-24 ENCOUNTER — Encounter: Payer: Self-pay | Admitting: Oncology

## 2021-03-24 NOTE — Progress Notes (Signed)
                                                                                                                                                             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  Intent: Palliative  Radiation Treatment Dates: 02/27/2021 through 03/21/2021 Site Technique Total Dose (Gy) Dose per Fx (Gy) Completed Fx Beam Energies  Femur Left: Ext_Lt 3D 37.5/37.5 2.5 15/15 15X  Femur Right: Ext_Rt 3D 37.5/37.5 2.5 15/15 15X   Narrative: The patient tolerated radiation therapy relatively well. She developed desquamation along her gluteal cleft and was treated with silvadene. Her treatment plan did not overlap with the skin in that area. Differentials include radiation recall   Plan: The patient will receive a call in about one month from the radiation oncology department. She will continue follow up with Dr. Jana Hakim as well. We will check in with her during that call regarding her skin.  ________________________________________________    Carola Rhine, PAC

## 2021-03-25 ENCOUNTER — Other Ambulatory Visit: Payer: Self-pay

## 2021-03-25 ENCOUNTER — Inpatient Hospital Stay (HOSPITAL_BASED_OUTPATIENT_CLINIC_OR_DEPARTMENT_OTHER): Payer: 59 | Admitting: Oncology

## 2021-03-25 ENCOUNTER — Inpatient Hospital Stay: Payer: 59

## 2021-03-25 ENCOUNTER — Inpatient Hospital Stay: Payer: 59 | Attending: Oncology

## 2021-03-25 VITALS — BP 123/82 | HR 100 | Temp 98.1°F | Resp 18 | Ht 68.0 in | Wt 161.4 lb

## 2021-03-25 DIAGNOSIS — D496 Neoplasm of unspecified behavior of brain: Secondary | ICD-10-CM

## 2021-03-25 DIAGNOSIS — Z170 Estrogen receptor positive status [ER+]: Secondary | ICD-10-CM

## 2021-03-25 DIAGNOSIS — C7931 Secondary malignant neoplasm of brain: Secondary | ICD-10-CM | POA: Diagnosis not present

## 2021-03-25 DIAGNOSIS — C787 Secondary malignant neoplasm of liver and intrahepatic bile duct: Secondary | ICD-10-CM | POA: Diagnosis not present

## 2021-03-25 DIAGNOSIS — Z95828 Presence of other vascular implants and grafts: Secondary | ICD-10-CM

## 2021-03-25 DIAGNOSIS — C50211 Malignant neoplasm of upper-inner quadrant of right female breast: Secondary | ICD-10-CM

## 2021-03-25 DIAGNOSIS — C7800 Secondary malignant neoplasm of unspecified lung: Secondary | ICD-10-CM | POA: Insufficient documentation

## 2021-03-25 DIAGNOSIS — C7951 Secondary malignant neoplasm of bone: Secondary | ICD-10-CM

## 2021-03-25 DIAGNOSIS — C7801 Secondary malignant neoplasm of right lung: Secondary | ICD-10-CM

## 2021-03-25 DIAGNOSIS — C7989 Secondary malignant neoplasm of other specified sites: Secondary | ICD-10-CM

## 2021-03-25 DIAGNOSIS — C7802 Secondary malignant neoplasm of left lung: Secondary | ICD-10-CM

## 2021-03-25 DIAGNOSIS — Z79899 Other long term (current) drug therapy: Secondary | ICD-10-CM | POA: Diagnosis not present

## 2021-03-25 DIAGNOSIS — Z7189 Other specified counseling: Secondary | ICD-10-CM

## 2021-03-25 DIAGNOSIS — Z5112 Encounter for antineoplastic immunotherapy: Secondary | ICD-10-CM | POA: Insufficient documentation

## 2021-03-25 LAB — CBC WITH DIFFERENTIAL/PLATELET
Abs Immature Granulocytes: 0.04 10*3/uL (ref 0.00–0.07)
Basophils Absolute: 0 10*3/uL (ref 0.0–0.1)
Basophils Relative: 1 %
Eosinophils Absolute: 0.1 10*3/uL (ref 0.0–0.5)
Eosinophils Relative: 2 %
HCT: 30.6 % — ABNORMAL LOW (ref 36.0–46.0)
Hemoglobin: 9.8 g/dL — ABNORMAL LOW (ref 12.0–15.0)
Immature Granulocytes: 1 %
Lymphocytes Relative: 4 %
Lymphs Abs: 0.3 10*3/uL — ABNORMAL LOW (ref 0.7–4.0)
MCH: 31 pg (ref 26.0–34.0)
MCHC: 32 g/dL (ref 30.0–36.0)
MCV: 96.8 fL (ref 80.0–100.0)
Monocytes Absolute: 0.9 10*3/uL (ref 0.1–1.0)
Monocytes Relative: 13 %
Neutro Abs: 5.6 10*3/uL (ref 1.7–7.7)
Neutrophils Relative %: 79 %
Platelets: 274 10*3/uL (ref 150–400)
RBC: 3.16 MIL/uL — ABNORMAL LOW (ref 3.87–5.11)
RDW: 21.6 % — ABNORMAL HIGH (ref 11.5–15.5)
WBC: 6.9 10*3/uL (ref 4.0–10.5)
nRBC: 0 % (ref 0.0–0.2)

## 2021-03-25 LAB — COMPREHENSIVE METABOLIC PANEL
ALT: 39 U/L (ref 0–44)
AST: 72 U/L — ABNORMAL HIGH (ref 15–41)
Albumin: 3.4 g/dL — ABNORMAL LOW (ref 3.5–5.0)
Alkaline Phosphatase: 186 U/L — ABNORMAL HIGH (ref 38–126)
Anion gap: 11 (ref 5–15)
BUN: 16 mg/dL (ref 6–20)
CO2: 22 mmol/L (ref 22–32)
Calcium: 9.6 mg/dL (ref 8.9–10.3)
Chloride: 104 mmol/L (ref 98–111)
Creatinine, Ser: 0.68 mg/dL (ref 0.44–1.00)
GFR, Estimated: >60 mL/min (ref >60)
Glucose, Bld: 126 mg/dL — ABNORMAL HIGH (ref 70–99)
Potassium: 3.9 mmol/L (ref 3.5–5.1)
Sodium: 137 mmol/L (ref 135–145)
Total Bilirubin: 0.6 mg/dL (ref 0.3–1.2)
Total Protein: 6.6 g/dL (ref 6.5–8.1)

## 2021-03-25 MED ORDER — DENOSUMAB 120 MG/1.7ML ~~LOC~~ SOLN
120.0000 mg | Freq: Once | SUBCUTANEOUS | Status: AC
  Administered 2021-03-25: 120 mg via SUBCUTANEOUS
  Filled 2021-03-25: qty 1.7

## 2021-03-25 MED ORDER — ONDANSETRON HCL 4 MG/2ML IJ SOLN
4.0000 mg | Freq: Once | INTRAMUSCULAR | Status: AC
  Administered 2021-03-25: 4 mg via INTRAVENOUS
  Filled 2021-03-25: qty 2

## 2021-03-25 MED ORDER — DEXAMETHASONE SODIUM PHOSPHATE 100 MG/10ML IJ SOLN
10.0000 mg | Freq: Once | INTRAMUSCULAR | Status: AC
  Administered 2021-03-25: 10 mg via INTRAVENOUS
  Filled 2021-03-25: qty 10

## 2021-03-25 MED ORDER — SODIUM CHLORIDE 0.9 % IV SOLN: Freq: Once | INTRAVENOUS | Status: AC

## 2021-03-25 MED ORDER — DIPHENHYDRAMINE HCL 25 MG PO CAPS
25.0000 mg | ORAL_CAPSULE | Freq: Once | ORAL | Status: AC
  Administered 2021-03-25: 25 mg via ORAL
  Filled 2021-03-25: qty 1

## 2021-03-25 MED ORDER — SODIUM CHLORIDE 0.9% FLUSH
10.0000 mL | INTRAVENOUS | Status: DC | PRN
  Administered 2021-03-25: 10 mL via INTRAVENOUS

## 2021-03-25 MED ORDER — TRASTUZUMAB-DKST CHEMO 150 MG IV SOLR
6.0000 mg/kg | Freq: Once | INTRAVENOUS | Status: AC
  Administered 2021-03-25: 462 mg via INTRAVENOUS
  Filled 2021-03-25: qty 22

## 2021-03-25 MED ORDER — DEXAMETHASONE 4 MG PO TABS: 8.0000 mg | ORAL_TABLET | Freq: Every morning | ORAL | 1 refills | Status: DC

## 2021-03-25 MED ORDER — HEPARIN SOD (PORK) LOCK FLUSH 100 UNIT/ML IV SOLN
500.0000 [IU] | Freq: Once | INTRAVENOUS | Status: AC | PRN
  Administered 2021-03-25: 500 [IU]

## 2021-03-25 MED ORDER — ACETAMINOPHEN 325 MG PO TABS: 650.0000 mg | ORAL_TABLET | Freq: Once | ORAL | Status: DC

## 2021-03-25 MED ORDER — SODIUM CHLORIDE 0.9% FLUSH
10.0000 mL | INTRAVENOUS | Status: DC | PRN
  Administered 2021-03-25: 10 mL

## 2021-03-25 NOTE — Patient Instructions (Signed)
[name]  Discharge Instructions: Thank you for choosing [facility] to provide your oncology and hematology care.   If you have a lab appointment with the Cancer Center, please go directly to the Cancer Center and check in at the registration area.   Wear comfortable clothing and clothing appropriate for easy access to any Portacath or PICC line.   We strive to give you quality time with your provider. You may need to reschedule your appointment if you arrive late (15 or more minutes).  Arriving late affects you and other patients whose appointments are after yours.  Also, if you miss three or more appointments without notifying the office, you may be dismissed from the clinic at the provider's discretion.      For prescription refill requests, have your pharmacy contact our office and allow 72 hours for refills to be completed.    Today you received the following chemotherapy and/or immunotherapy agents : Herceptin     To help prevent nausea and vomiting after your treatment, we encourage you to take your nausea medication as directed.  BELOW ARE SYMPTOMS THAT SHOULD BE REPORTED IMMEDIATELY: *FEVER GREATER THAN 100.4 F (38 C) OR HIGHER *CHILLS OR SWEATING *NAUSEA AND VOMITING THAT IS NOT CONTROLLED WITH YOUR NAUSEA MEDICATION *UNUSUAL SHORTNESS OF BREATH *UNUSUAL BRUISING OR BLEEDING *URINARY PROBLEMS (pain or burning when urinating, or frequent urination) *BOWEL PROBLEMS (unusual diarrhea, constipation, pain near the anus) TENDERNESS IN MOUTH AND THROAT WITH OR WITHOUT PRESENCE OF ULCERS (sore throat, sores in mouth, or a toothache) UNUSUAL RASH, SWELLING OR PAIN  UNUSUAL VAGINAL DISCHARGE OR ITCHING   Items with * indicate a potential emergency and should be followed up as soon as possible or go to the Emergency Department if any problems should occur.  Please show the CHEMOTHERAPY ALERT CARD or IMMUNOTHERAPY ALERT CARD at check-in to  the Emergency Department and triage nurse.  Should you have questions after your visit or need to cancel or reschedule your appointment, please contact [facility]  Dept: [phone]  and follow the prompts.  Office hours are 8:00 a.m. to 4:30 p.m. Monday - Friday. Please note that voicemails left after 4:00 p.m. may not be returned until the following business day.  We are closed weekends and major holidays. You have access to a nurse at all times for urgent questions. Please call the main number to the clinic Dept: [phone] and follow the prompts.   For any non-urgent questions, you may also contact your provider using MyChart. We now offer e-Visits for anyone 18 and older to request care online for non-urgent symptoms. For details visit [email or web address].   Also download the MyChart app! Go to the app store, search "MyChart", open the app, select [facility], and log in with your MyChart username and password.  Due to Covid, a mask is required upon entering the hospital/clinic. If you do not have a mask, one will be given to you upon arrival. For doctor visits, patients may have 1 support person aged 18 or older with them. For treatment visits, patients cannot have anyone with them due to current Covid guidelines and our immunocompromised population.   

## 2021-03-25 NOTE — Progress Notes (Signed)
Monica [name]  [phone] [phone]     ID: Monica [name] DOB: 1971/07/13  MR#: [id number]  JQB#:[id number]  Patient Care Team: [name], PA-C as PCP - General (Family Medicine) [name], MD as Consulting Physician (General Surgery) Brecken Dewoody, [name], MD as Consulting Physician (Oncology) [name], MD as Consulting Physician (Radiation Oncology) [name], MD as Consulting Physician (Obstetrics and Gynecology) Larey Dresser, MD as Consulting Physician (Cardiology) Janice Coffin, MD as Referring Physician (Orthopedic Surgery) Judith Part, MD as Consulting Physician (Neurosurgery) Raina Mina, RPH-CPP (Pharmacist) Ventura Sellers, MD as Consulting Physician (Psychiatry) OTHER MD: [facility]   CHIEF COMPLAINT: triple positive breast cancer  CURRENT TREATMENT: tucatinib, capecitabine, trastuzumab; denosumab Delton See   INTERVAL HISTORY: Monica [name] returns today for follow up of her triple positive breast cancer accompanied by her mother.  Since her last visit, she underwent restaging brain MRI on 03/06/2021 showing: status post right temporoparietal craniotomy and resection of subjacent lesion, with interval collapse of the resection cavity; minimal enhancement along the resection margin may be postsurgical, although residual or recurrent tumor cannot be excluded; additional previously noted enhancing parenchymal lesions are stable or decreased in size, some are no longer seen; no new enhancing parenchymal lesions; interval increase in size and number of multiple calvarial lesions, including lesions in the left occipital condyle and left anterior arch of C1.  She met with Dr. Venetia Constable who reviewed the films with her and her mother. She also underwent T9-10 kyphoplasty on 03/07/21 under Dr. Zada Finders.  She completed palliative radiation therapy to bilateral proximal femora under Dr. [name] on  03/21/2021  She started tucatinib and capecitabine on 02/12/2021.  She is tolerating this combination remarkably well.  She has no mouth sores, no diarrhea and no rash.  She is not more fatigued than before.  She receives the trastuzumab every 21 days with a dose due today.  Her most recent echocardiogram was 01/14/2021.   REVIEW OF SYSTEMS: Monica [name] feels very "flat".  This is what is bothering her the most.  She wonders if we should stop the Cymbalta or make other changes.  She is significantly nauseated and is taking quite a bit of Compazine, which may be what is actually making her feel that way.  She still vomits sometimes.  She has to force herself to eat and nothing tastes particularly well.  Drinking is also an issue.  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  Fortunately there is no diarrhea.  Detailed review of systems was otherwise stable.   COVID 19 VACCINATION STATUS: Status post Moderna x2, no booster as of December 2021   HISTORY OF CURRENT ILLNESS: From the original intake note:   Monica [name] (pronounced "uric") palpated a right supraclavicular mass and felt tenderness after stretching one day. She brought this to her PA's attention.  The patient underwent bilateral diagnostic mammography with tomography and right breast ultrasonography at [facility] on 12/20/2017 showing: breast density category C. There was a suspicious palpable mass at the 1 o'clock position upper inner quadrant measuring 1.4 x 1.3 x 1.3 cm located 12 cm from the nipple. There was an additional mass at the 8 o'clock radiant measuring 1.2 x 1.0 x 1.4 cm, which likely represented a cyst. Ultrasound evaluation of the right axilla demonstrates no evidence of lymphadenopathy   Accordingly on 12/29/2017 she proceeded to biopsy of the right breast area in  question. The pathology from this procedure showed ([id number]): Invasive ductal carcinoma, grade III.  Prognostic indicators significant for: estrogen receptor, 80% positive and progesterone receptor, 70% positive, both with strong staining intensity. Proliferation marker Ki67 at 80%. HER2 amplified with ratios HER2/CEP17 signals 3.44 and average HER2 copies per cell 10.65.   The patient's subsequent history is as detailed below.   PAST MEDICAL HISTORY: Past Medical History:  Diagnosis Date   Arthritis    lower back   Cancer [facility])    right breast cancer   Family history of breast cancer    Personal history of chemotherapy    Personal history of radiation therapy   She notes that she had a heart murmur as a child, which she grew out of.    PAST SURGICAL HISTORY: Past Surgical History:  Procedure Laterality Date   ABDOMINAL HYSTERECTOMY     APPLICATION OF CRANIAL NAVIGATION N/A 12/06/2020   Procedure: APPLICATION OF CRANIAL NAVIGATION;  Surgeon: Judith Part, MD;  Location: [place];  Service: Neurosurgery;  Laterality: N/A;  RM 20   BREAST LUMPECTOMY Right    BREAST LUMPECTOMY WITH RADIOACTIVE SEED AND SENTINEL LYMPH NODE BIOPSY Right 02/16/2018   Procedure: RIGHT BREAST LUMPECTOMY WITH RADIOACTIVE SEED AND SENTINEL LYMPH NODE BIOPSY;  Surgeon: [name], MD;  Location: [place];  Service: General;  Laterality: Right;   CRANIOTOMY Right 12/06/2020   Procedure: Right sided awake craniotomy for tumor resection;  Surgeon: Judith Part, MD;  Location: [place];  Service: Neurosurgery;  Laterality: Right;   HYMENECTOMY     IR IMAGING GUIDED PORT INSERTION  03/12/2021   IR US GUIDE BX ASP/DRAIN  12/02/2020   KYPHOPLASTY N/A 03/07/2021   Procedure: Thoracic nine, Thoracic ten KYPHOPLASTY;  Surgeon: Judith Part, MD;  Location: [place];  Service: Neurosurgery;  Laterality: N/A;   PORTACATH PLACEMENT Right 02/16/2018   Procedure: INSERTION PORT-A-CATH;  Surgeon: [name], MD;  Location: [place];  Service: General;  Laterality: Right;    RE-EXCISION OF BREAST LUMPECTOMY Right 02/22/2018   Procedure: RE-EXCISION OF RIGHT  BREAST LUMPECTOMY;  Surgeon: [name], MD;  Location: [place];  Service: General;  Laterality: Right;   REPAIR VAGINAL CUFF N/A 02/07/2017   Procedure: REPAIR VAGINAL CUFF;  Surgeon: Ena Dawley, MD;  Location: [facility];  Service: Gynecology;  Laterality: N/A;   ROBOTIC ASSISTED TOTAL HYSTERECTOMY WITH SALPINGECTOMY Left 01/20/2017   Procedure: ROBOTIC ASSISTED TOTAL HYSTERECTOMY WITH SALPINGECTOMY;  Surgeon: [name], MD;  Location: [facility];  Service: Gynecology;  Laterality: Left;   TOTAL HIP ARTHROPLASTY Right 2022   total hip and partial femur, per pt  Partial Hysterectomy without BSO   FAMILY HISTORY Family History  Problem Relation Age of Onset   Lung cancer Maternal Grandfather    Esophageal cancer Paternal Grandfather 36   Breast cancer Cousin 34  As of July 2019, the patient's father is alive at 18. The patient's mother is also alive at 57. The patient has 1 brother and 5 sisters. There was a maternal grandfather diagnosed with lung cancer at 43. There was a paternal grandfather diagnosed with esophageal cancer at 64. The patient's father had a pre-cancerous esophageal finding at age 49. There was also a maternal 1st cousin diagnosed with metastatic breast cancer at age 10.    GYNECOLOGIC HISTORY:  Patient's last menstrual period was 12/21/2016 (exact date). Menarche: 50 years old Age at first live birth: 50 years old She is GXP2.  She is status post partial hysterectomy without BSO in 2018 She never used HRT. She used oral contraception over 21 years ago with no complications.   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 The patient is separated from her husband, [name]. The patient's son Monica [name] is in the [employer] and is stationed in [place] (for 3 years beginning January 2020).  He plans on working in the railroad industry after he returns. The patient's daughter Monica [name], age 55, works at Engineer, maintenance    [place]: Not in place; at the 06/20/2020 visit the patient was given the appropriate documents to complete and notarized at her discretion   HEALTH MAINTENANCE: Social History   Tobacco Use   Smoking status: Never   Smokeless tobacco: Never  Vaping Use   Vaping Use: Former  Substance Use Topics   Alcohol use: Not Currently   Drug use: No     Colonoscopy:   PAP: 2018/ prior to hysterectomy  Bone density:   No Known Allergies  Current Outpatient Medications  Medication Sig Dispense Refill   dexamethasone (DECADRON) 4 MG tablet Take 2 tablets (8 mg total) by mouth in the morning. 30 tablet 1   acetaminophen (TYLENOL) 500 MG tablet Take 1 tablet (500 mg total) by mouth 3 (three) times daily as needed for mild pain (or Fever >/= 101). Take with ibuprofen 400 mg     b complex vitamins capsule Take 1 capsule by mouth daily. 900 mg     capecitabine (XELODA) 500 MG tablet Take 1,500 mg by mouth 3 (three) times daily.     Cholecalciferol (VITAMIN D) 125 MCG (5000 UT) CAPS Take 5,000 Units by mouth daily.     DULoxetine (CYMBALTA) 20 MG capsule TAKE 1 CAPSULE BY MOUTH EVERY DAY 90 capsule 2   ferrous sulfate 325 (65 FE) MG tablet Take 325 mg by mouth daily with breakfast.     fluconazole (DIFLUCAN) 100 MG tablet Take 100 mg by mouth daily.     gabapentin (NEURONTIN) 300 MG capsule Take 1 capsule (300 mg total) by mouth 3 (three) times daily. 90 capsule 6   ibuprofen (ADVIL) 400 MG tablet Take 1 tablet (400 mg total) by mouth 3 (three) times daily. Take with tylenol 500 mg (Patient taking differently: Take 400 mg by mouth 3 (three) times daily as needed for mild pain or moderate pain.) 90 tablet 3   lidocaine (LIDODERM) 5 % Place 1 patch onto the skin daily as needed (for pain- Remove & Discard patch within 12 hours or as directed by MD).     ondansetron  (ZOFRAN ODT) 4 MG disintegrating tablet Take 1 tablet (4 mg total) by mouth See admin instructions. Dissolve 4 mg in the mouth three times a day and an additional 4 mg at bedtime as needed for nausea/vomiting 20 tablet 3   pantoprazole (PROTONIX) 40 MG tablet Take 40 mg by mouth daily. (Patient not taking: Reported on 03/07/2021)     polyethylene glycol (MIRALAX / GLYCOLAX) 17 g packet Take 17 g by mouth daily as needed for moderate constipation. 14 each 0   prochlorperazine (COMPAZINE) 10 MG tablet Take 1 tablet (10 mg total) by mouth every 6 (six) hours. (Patient taking differently: Take 10 mg by mouth 3 (three) times daily.) 30 tablet 3   pyridOXINE (VITAMIN B-6) 100 MG tablet Take 100 mg by mouth daily.     senna-docusate (SENOKOT-S) 8.6-50 MG tablet Take 2  tablets by mouth 2 (two) times daily.     traMADol (ULTRAM) 50 MG tablet Take 1 tablet (50 mg total) by mouth every 6 (six) hours as needed. 120 tablet 0   traZODone (DESYREL) 50 MG tablet Take 1 tablet (50 mg total) by mouth at bedtime as needed for sleep.     TUKYSA 150 MG tablet Take 150 mg by mouth 2 (two) times daily.     No current facility-administered medications for this visit.   Facility-Administered Medications Ordered in Other Visits  Medication Dose Route Frequency Provider Last Rate Last Admin   sodium chloride flush (NS) 0.9 % injection 10 mL  10 mL Intracatheter Once Melek Pownall, [name], MD        OBJECTIVE: White woman examined in a wheelchair  Vitals:   03/25/21 1348  BP: 123/82  Pulse: 100  Resp: 18  Temp: 98.1 F (36.7 C)  SpO2: 97%      Body mass index is 24.54 kg/m.   Wt Readings from Last 3 Encounters:  03/25/21 161 lb 6.4 oz (73.2 kg)  03/12/21 162 lb (73.5 kg)  03/07/21 162 lb (73.5 kg)  ECOG FS:1  Sclerae unicteric, EOMs intact Wearing a mask No cervical or supraclavicular adenopathy Lungs no rales or rhonchi Heart regular rate and rhythm Abd soft, nontender, positive bowel sounds MSK no focal  spinal tenderness, no upper extremity lymphedema Neuro: nonfocal, well oriented, frustrated but appropriate affect Breasts: Deferred   LAB RESULTS:  CMP     Component Value Date/Time   NA 137 03/25/2021 1333   K 3.9 03/25/2021 1333   CL 104 03/25/2021 1333   CO2 22 03/25/2021 1333   GLUCOSE 126 (H) 03/25/2021 1333   BUN 16 03/25/2021 1333   CREATININE 0.68 03/25/2021 1333   CREATININE 0.81 06/20/2020 0839   CALCIUM 9.6 03/25/2021 1333   PROT 6.6 03/25/2021 1333   ALBUMIN 3.4 (L) 03/25/2021 1333   AST 72 (H) 03/25/2021 1333   AST 19 06/20/2020 0839   ALT 39 03/25/2021 1333   ALT 14 06/20/2020 0839   ALKPHOS 186 (H) 03/25/2021 1333   BILITOT 0.6 03/25/2021 1333   BILITOT 0.5 06/20/2020 0839   GFRNONAA >60 03/25/2021 1333   GFRNONAA >60 06/20/2020 0839   GFRAA >60 06/13/2019 1138    Lab Results  Component Value Date   WBC 6.9 03/25/2021   NEUTROABS 5.6 03/25/2021   HGB 9.8 (L) 03/25/2021   HCT 30.6 (L) 03/25/2021   MCV 96.8 03/25/2021   PLT 274 03/25/2021    No results found for: LABCA2  No components found for: [id number]  No results for input(s): INR in the last 168 hours.  No results found for: LABCA2  No results found for: CAN199  Lab Results  Component Value Date   CAN125 189.0 (H) 03/25/2021    Lab Results  Component Value Date   CAN153 244.0 (H) 03/25/2021    Lab Results  Component Value Date   CA2729 275.5 (H) 03/25/2021    No components found for: HGQUANT  No results found for: CEA1 / No results found for: CEA1   No results found for: AFPTUMOR  No results found for: CHROMOGRNA  No results found for: TOTALPROTELP, ALBUMINELP, A1GS, A2GS, BETS, BETA2SER, GAMS, MSPIKE, SPEI (this displays SPEP labs)  No results found for: KPAFRELGTCHN, LAMBDASER, KAPLAMBRATIO (kappa/lambda light chains)  No results found for: HGBA, HGBA2QUANT, HGBFQUANT, HGBSQUAN (Hemoglobinopathy evaluation)   No results found for: LDH  Lab Results  Component  Value  Date   IRON 69 01/16/2021   TIBC 361 01/16/2021   IRONPCTSAT 19 01/16/2021   (Iron and TIBC)  Lab Results  Component Value Date   FERRITIN 2,032 (H) 01/16/2021    Urinalysis    Component Value Date/Time   COLORURINE YELLOW 11/30/2020 1315   APPEARANCEUR HAZY (A) 11/30/2020 1315   LABSPEC 1.009 11/30/2020 1315   PHURINE 5.0 11/30/2020 1315   GLUCOSEU NEGATIVE 11/30/2020 1315   [place] 11/30/2020 1315   [place] 11/30/2020 1315   [place] 11/30/2020 1315   PROTEINUR NEGATIVE 11/30/2020 1315   NITRITE NEGATIVE 11/30/2020 1315   LEUKOCYTESUR TRACE (A) 11/30/2020 1315    STUDIES: DG THORACOLUMABAR SPINE  Result Date: 03/07/2021 CLINICAL DATA:  T9 and T10 kyphoplasties. EXAM: THORACOLUMBAR SPINE 1V Radiation exposure index: 78.61 mGy. COMPARISON:  September 13, 2020. FINDINGS: Four intraoperative fluoroscopic images were obtained of the lower thoracic spine. These demonstrate the patient be status post kyphoplasty of T9 and T10 vertebral bodies. IMPRESSION: Fluoroscopic guidance provided during T9 and T10 kyphoplasty. Electronically Signed   By: Marijo Conception M.D.   On: 03/07/2021 11:34   MR Brain W Wo Contrast  Result Date: 03/07/2021 CLINICAL DATA:  Neoplasm surveillance EXAM: MRI HEAD WITHOUT AND WITH CONTRAST TECHNIQUE: Multiplanar, multiecho pulse sequences of the brain and surrounding structures were obtained without and with intravenous contrast. CONTRAST:  6.34m GADAVIST GADOBUTROL 1 MMOL/ML IV SOLN COMPARISON:  12/07/2020, 12/02/2020 FINDINGS: Brain: Status post right temporoparietal craniotomy with subjacent resection cavity, which has collapsed since the prior exam. Minimal enhancement is noted along the resection margins. Interval decrease in surrounding T2 hyperintense signal. Additional enhancing lesions, with comparison to 12/02/2020: - 3 mm lesion in the anterior left frontal lobe (series 1100, image 231), previously 4 mm. - 6 mm enhancing  lesion in the posterior right frontal lobe (series 1100, image 244), previously 6 mm. - 11 x 6 mm lesion in the posterior right parietal lobe (series 1100, image 192), previously 22 x 12 mm. - 2 mm lesion in the left posterior cerebellum (series 1100, image 134), previously 9 x 8 mm. - Previously noted lesion in the medial right cerebellar hemisphere is no longer seen. - Punctate lesion in the left posterior frontal lobe is no longer seen. - Previously noted lesion in the right basal ganglia is no longer seen. No new enhancing lesions. No mass effect or midline shift. No extra-axial fluid collection, acute hemorrhage, acute infarct, or hydrocephalus. Vascular: Normal flow voids. Skull and upper cervical spine: Prior right temporal craniotomy. Bifrontal calvarial lesions have increased in size, now measuring approximately 4.0 x 1.6 cm on the right (series 1100, image 252), previously up to 1.5 cm when remeasured similarly. Abnormal left frontal calvarial enhancement measures up to 4.9 cm (series 1100, image 246), previously up to 2.4 cm. New enhancing lesions in the high right frontal calvarium (series 1100, image 268), measuring up to 1.2 cm, and left frontal calvarium along the left frontal sinus (series 1100, image 206), measuring to 1.2 cm. In the left parietal calvarium, a previously noted lesion now measures up to 2.9 cm (series 1100, image 227), previously up to 1.5 cm. New lesions more posteriorly, measuring up to 1.0 cm (series 1100, image 231) and 0.8 cm (series 1100, image 199). New area of enhancement in the right parietal calvarium (series 1100, image 208), measuring up to 0.8 cm. Abnormal focus of enhancement in the left anterior arch of C1 (series 1100, image 103), left occipital condyle (series  1100, image 112), and left aspect of the skull base (series 1100, image 121). Sinuses/Orbits: Negative. Other: None. IMPRESSION: 1. Status post right temporoparietal craniotomy and resection of subjacent  lesion, with interval collapse of the resection cavity. Minimal enhancement along the resection margin may be postsurgical, although residual or recurrent tumor cannot be excluded. Attention on follow-up. 2. Additional previously noted enhancing parenchymal lesions are stable or decreased in size; some of them are no longer seen. No new enhancing parenchymal lesions. 3. Interval increase in the size and number of multiple calvarial lesions, including lesions in the left occipital condyle and left anterior arch of C1. Electronically Signed   By: Merilyn Baba M.D.   On: 03/07/2021 15:09   DG C-Arm 1-60 Min-No Report  Result Date: 03/07/2021 CLINICAL DATA:  T9 and T10 kyphoplasties. EXAM: THORACOLUMBAR SPINE 1V Radiation exposure index: 78.61 mGy. COMPARISON:  September 13, 2020. FINDINGS: Four intraoperative fluoroscopic images were obtained of the lower thoracic spine. These demonstrate the patient be status post kyphoplasty of T9 and T10 vertebral bodies. IMPRESSION: Fluoroscopic guidance provided during T9 and T10 kyphoplasty. Electronically Signed   By: Marijo Conception M.D.   On: 03/07/2021 11:34   IR IMAGING GUIDED PORT INSERTION  Result Date: 03/12/2021 INDICATION: Metastatic breast cancer. Patient presents for placement of port catheter to facilitate chemotherapy. EXAM: IMPLANTED PORT A CATH PLACEMENT WITH ULTRASOUND AND FLUOROSCOPIC GUIDANCE MEDICATIONS: None. ANESTHESIA/SEDATION: Versed 2 mg IV; Fentanyl 100 mcg IV; Moderate Sedation Time:  20 minutes The patient was continuously monitored during the procedure by the interventional radiology nurse under my direct supervision. FLUOROSCOPY TIME:  0 minutes, 30 seconds (4 mGy) COMPLICATIONS: None immediate. PROCEDURE: The right neck and chest was prepped with chlorhexidine, and draped in the usual sterile fashion using maximum barrier technique (cap and mask, sterile gown, sterile gloves, large sterile sheet, hand hygiene and cutaneous antiseptic). Local  anesthesia was attained by infiltration with 1% lidocaine with epinephrine. Ultrasound demonstrated patency of the right internal jugular vein, and this was documented with an image. Under real-time ultrasound guidance, this vein was accessed with a 21 gauge micropuncture needle and image documentation was performed. A small dermatotomy was made at the access site with an 11 scalpel. A 0.018" wire was advanced into the SVC and the access needle exchanged for a 69F micropuncture vascular sheath. The 0.018" wire was then removed and a 0.035" wire advanced into the IVC. An appropriate location for the subcutaneous reservoir was selected below the clavicle and an incision was made through the skin and underlying soft tissues. The subcutaneous tissues were then dissected using a combination of blunt and sharp surgical technique and a pocket was formed. A single lumen power injectable portacatheter was then tunneled through the subcutaneous tissues from the pocket to the dermatotomy and the port reservoir placed within the subcutaneous pocket. The venous access site was then serially dilated and a peel away vascular sheath placed over the wire. The wire was removed and the port catheter advanced into position under fluoroscopic guidance. The catheter tip is positioned in the superior cavoatrial junction. This was documented with a spot image. The portacatheter was then tested and found to flush and aspirate well. The port was flushed with saline followed by 100 units/mL heparinized saline. The pocket was then closed using subdermal inverted interrupted absorbable sutures followed by Dermabond. The dermatotomy at the venous access site was also closed with Dermabond. IMPRESSION: Successful placement of a right IJ approach Power Port with ultrasound and fluoroscopic  guidance. The catheter is ready for use. Electronically Signed   By: Jacqulynn Cadet M.D.   On: 03/12/2021 13:15      ELIGIBLE FOR AVAILABLE RESEARCH  PROTOCOL: BCEP  ASSESSMENT: 50 y.o.  [place], [place] woman status post right breast upper inner quadrant biopsy 12/29/2017, for a clinical T1c N0, stage IA invasive ductal carcinoma, triple positive, with an MIB-1 of 80%.  (1) genetics testing 02/02/2018 though the CancerNext gene panel offered by [facility] showed no deleterious mutations in  APC, ATM, BARD1, BMPR1A,BRCA1, BRCA2, BRIP1, CDH1, CDK4, CDKN2A, CHEK2, DICER1, HOXB13, MLH1, MRE11A, MSH2, MSH6, MUTYH, NBN, NF1, PALB2, PMS2, POLD1, POLE, PTEN, RAD50, RAD51C, RAD51D, SMAD4, SMARCA4, STK11 and TP53 (sequencing and deletion/duplication); EPCAM and GREM1 (deletion/duplication only).  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>A) )   (2) right lumpectomy and sentinel lymph node sampling 02/16/2018 showed a pT2 pN0, stage IB invasive ductal carcinoma, grade 3, with a positive inferior margin; a total of 5 lymph nodes were removed  (a) additional surgery 02/27/2018 cleared the margins  (3) started carboplatin, docetaxel, trastuzumab and pertuzumab 03/11/2018, repeated every 21 days x 6, last dose 07/18/2018.    (a) Pertuzumab omitted after cycle 1 due to diarrhea.  (b) Gemcitabine substituted for docetaxel starting with cycle 5 due to peripheral neuropathy  (4) continued trastuzumab to total 6 months (through February 2020).  (a) echocardiogram 01/21/2018 showed an ejection fraction in the 55-60% range  (b) repeat echocardiogram 06/14/2018 showed an ejection fraction in the 60-65%  (5) adjuvant radiation 08/15/2018 - 09/28/2018  Site/dose:   The patient initially received a dose of 50.4 Gy in 28 fractions to the right breast using whole-breast tangent fields. This was delivered using a 3-D conformal technique. The patient then received a boost to the seroma. This delivered an additional 10 Gy in 5 fractions using 12E, 9E electrons with a special teletherapy technique. The total dose was 60.4 Gy.   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, involving brain, liver, lungs, and bones (7) presenting 11/29/2020 with progressive back pain:             (a) non-contrast CT abd/pelvis (renal stone study) 11/29/2020 shows bone metastases             (b) CT chest/abd/pelvis with contrast 11/30/2020 shows multiple large liver masses, multiple pulmonary nodules, and widespread lytic bone lesions             (c) MRI brain shows at least 4 brain lesions, largest 3.8 cm             (d) total spinal MRI 11/30/2020 shows pathologic fractures at T9, T12, possibly L2, as well as numerous other spine lesions, but no epidural enhancement or cord compression             (e) liver biopsy 12/02/2020 shows adenocarcinoma, prognostic panel again triple positive             (f) pre-op repeat MRI 12/01/2020 shows additional brain lesions, at least 8 of which will be targets for SRS   (8) CNS treatment:             (a) [place] 12/05/2020             (b) surgery 12/06/2020 confirmed metastatic carcinoma, estrogen  receptor positive, HER2 amplified, progesterone receptor negative, with an MIB-1 of 70%.  (c) brain MRI 03/06/2021 shows minimal enhancement along the resection margin, previously noted lesions stable or decreased in size, some no longer seen, and no new brain lesions   (9) XRT to spine 12/05/2020 through 12/23/2020 Site Technique Total Dose (Gy) Dose per Fx (Gy) Completed Fx Beam Energies  Thoracic Spine: Spine_T9-L3 Complex 30/30 3 10/10 15X  Pelvis: Pelvis_sacrum Complex 30/30 3 10/10 15X   (10) advanced directives-- plans to name son as HCPOA with her father as secondary   (69) started capecitabine 01/08/2021, trastuzumab 01/20/2021, tucatinib 02/10/2021  (a) echo 01/14/2021 shows an ejection fraction in the 60-65%  range.  (12) started denosumab/Xgeva 02/10/2021, repeated every 6 weeks  (a) received one dose pamidronate in hospital 01/16/2021  (13) pain management:  (a) Tylenol 500 mg and Advil 400 mg 3 times a day with food  (b) gabapentin 3 times a day as needed  (c) tramadol 50 mg 4 times a day as needed if above not adequate  (d) bowel prophylaxis: MiraLAX daily, stool softeners 2 tablets twice daily  (14) status post T9 and T10 kyphoplasty 03/07/2021   PLAN: Monica [name] is now 4 months out from definitive diagnosis of widely metastatic recurrent breast cancer.  She has had palliative radiation to the spine and has undergone kyphoplasty of T9 and T10, which should help decrease the pain.  Currently she is not using narcotics.  She is having regular bowel movements.  She is tolerating the capecitabine, trastuzumab, and tucatinib remarkably well and she will receive trastuzumab today.  Her most recent [place] echo was early July and she will have a repeat in October  Her central nervous system was treated with surgery and she is receiving tucatinib.  The results of the repeat brain MRI are encouraging.  Her tumor markers also are showing a favorable decline.  At this point I think what would be best is to dexamethasone to see if we can control her nausea.  If she can go off the Compazine perhaps she will not feel so "flat".  The real reason for her Cymbalta of course is for pain management not so much for depression and we can certainly taper off that medication as needed.  Accordingly we started her on dexamethasone 8 mg in the morning with breakfast.  We will check with her early next week to make sure she is tolerating this well and to see whether it is working and if it is we can consider dropping the dose.  Otherwise she will return to see me with her next trastuzumab dose.  Total encounter time 35 minutes.Sarajane Jews C. Joleena Weisenburger, MD  03/27/21 9:31 PM Medical Oncology and Hematology [facility] [address], [place] [address] Tel. [phone]    Fax. [phone]   I, Wilburn Mylar, am acting as scribe for Dr. [name]. Tanairy Payeur.  I, Lurline Del MD, have reviewed the above documentation for accuracy and completeness, and I agree with the above.   *Total Encounter Time as defined by the Centers for Medicare and Medicaid Services includes, in addition to the face-to-face time of a patient visit (documented in the note above) non-face-to-face time: obtaining and reviewing outside history, ordering and reviewing medications, tests or procedures, care coordination (communications with other health care professionals or caregivers) and documentation in the medical record.

## 2021-03-26 ENCOUNTER — Encounter: Payer: Self-pay | Admitting: Oncology

## 2021-03-26 LAB — CA 125: Cancer Antigen (CA) 125: 189 U/mL — ABNORMAL HIGH (ref 0.0–38.1)

## 2021-03-26 LAB — CANCER ANTIGEN 27.29: CA 27.29: 275.5 U/mL — ABNORMAL HIGH (ref 0.0–38.6)

## 2021-03-26 LAB — CANCER ANTIGEN 15-3: CA 15-3: 244 U/mL — ABNORMAL HIGH (ref 0.0–25.0)

## 2021-03-27 ENCOUNTER — Encounter: Payer: Self-pay | Admitting: Oncology

## 2021-03-30 NOTE — Progress Notes (Signed)
c 

## 2021-03-31 ENCOUNTER — Inpatient Hospital Stay (HOSPITAL_BASED_OUTPATIENT_CLINIC_OR_DEPARTMENT_OTHER): Payer: 59 | Admitting: Oncology

## 2021-03-31 ENCOUNTER — Other Ambulatory Visit: Payer: Self-pay | Admitting: Radiation Oncology

## 2021-03-31 ENCOUNTER — Ambulatory Visit
Admission: RE | Admit: 2021-03-31 | Discharge: 2021-03-31 | Disposition: A | Discharge status: Home / Self Care | Payer: Self-pay | Source: Ambulatory Visit | Attending: Radiation Oncology | Admitting: Radiation Oncology

## 2021-03-31 DIAGNOSIS — C7951 Secondary malignant neoplasm of bone: Secondary | ICD-10-CM

## 2021-03-31 DIAGNOSIS — C7989 Secondary malignant neoplasm of other specified sites: Secondary | ICD-10-CM

## 2021-03-31 DIAGNOSIS — D496 Neoplasm of unspecified behavior of brain: Secondary | ICD-10-CM | POA: Diagnosis not present

## 2021-03-31 DIAGNOSIS — C7931 Secondary malignant neoplasm of brain: Secondary | ICD-10-CM

## 2021-03-31 DIAGNOSIS — Z170 Estrogen receptor positive status [ER+]: Secondary | ICD-10-CM

## 2021-03-31 DIAGNOSIS — C50211 Malignant neoplasm of upper-inner quadrant of right female breast: Secondary | ICD-10-CM

## 2021-03-31 MED ORDER — DEXAMETHASONE 4 MG PO TABS: 4.0000 mg | ORAL_TABLET | Freq: Every day | ORAL | 1 refills | Status: DC

## 2021-03-31 MED ORDER — PROCHLORPERAZINE MALEATE 5 MG PO TABS: 5.0000 mg | ORAL_TABLET | Freq: Three times a day (TID) | ORAL | 4 refills | Status: DC | PRN

## 2021-03-31 NOTE — Addendum Note (Signed)
Addended by: Chauncey Cruel on: 03/31/2021 01:18 PM   Modules accepted: Orders

## 2021-04-01 ENCOUNTER — Telehealth: Payer: Self-pay | Admitting: Radiation Oncology

## 2021-04-01 NOTE — Telephone Encounter (Signed)
I spoke with patient today.  We had received a message from her orthopedic surgeon at [facility] Dr. Warren Lacy with concerns about recent imaging in the distal femurs.  Dr. [name] was able to look at her films after they were power shared from [facility], he did identify some concerns for disease radiographically in the distal femurs which were not part of her treatment field, he favors only treating additional bony disease out of concerns for possible impending fractures or pain.  The patient reports that she is not having any pain in her lower thighs or knee areas.  She is hopeful to avoid additional radiation as she is had multiple courses in the last year.  She will let us know if she is having concerns so that Dr. [name] could consider radiation after reviewing her imaging moving forward.  We also discussed her skin in the sacral region.  She states that she believes the skin is about the same as it had been during the last week of her treatment.  She is planning to have a family member help her to take a photograph so she can send this into my chart and [place] evaluate this.  I let her know I would call her in a few weeks to see how she is doing.

## 2021-04-02 ENCOUNTER — Encounter: Payer: Self-pay | Admitting: Oncology

## 2021-04-02 ENCOUNTER — Telehealth: Payer: Self-pay | Admitting: *Deleted

## 2021-04-02 NOTE — Progress Notes (Addendum)
Monica [name]  [phone] [phone]     ID: Monica [name] DOB: 10/26/1970  MR#: [id number]  WCB#:[id number]  Patient Care Team: [name], PA-C as PCP - General (Family Medicine) [name], MD as Consulting Physician (General Surgery) Irys Nigh, [name], MD as Consulting Physician (Oncology) [name], MD as Consulting Physician (Radiation Oncology) [name], MD as Consulting Physician (Obstetrics and Gynecology) Larey Dresser, MD as Consulting Physician (Cardiology) Janice Coffin, MD as Referring Physician (Orthopedic Surgery) Judith Part, MD as Consulting Physician (Neurosurgery) Raina Mina, RPH-CPP (Pharmacist) Ventura Sellers, MD as Consulting Physician (Psychiatry) Judith Part, MD as Consulting Physician (Neurosurgery) OTHER MD: [facility]  I connected with Monica [name] on 04/02/21 at 10:30 AM EDT by telephone visit and verified that I am speaking with the correct person using two identifiers.   I discussed the limitations, risks, security and privacy concerns of performing an evaluation and management service by telemedicine and the availability of in-person appointments. I also discussed with the patient that there may be a patient responsible charge related to this service. The patient expressed understanding and agreed to proceed.   Other persons participating in the visit and their role in the encounter: Patient's mother  Patient's location: Home Provider's location: Clinic   CHIEF COMPLAINT: triple positive breast cancer  CURRENT TREATMENT: tucatinib, capecitabine, trastuzumab; denosumab Xgeva   INTERVAL HISTORY: Monica [name] was contacted today for follow up of her triple positive breast cancer accompanied by her mother.  Initially I was not able to get through despite several calls and left messages but then I called her mother who was actually back in [place].  The  patient's sister is now temporarily staying with her.  The patient's mother did tell me however that the dexamethasone we had started was helping, that Monica [name] had less back pain in particular and the nausea appeared to be a bit better controlled.  Eventually I was able to connect with gradual directly.  She agreed with the above.  We decided we would drop the dexamethasone dose to 4 mg every morning and drop the Compazine to 5 mg before meals.  Subsequently the patient called to report some jerking motions likely due to the Compazine.  We are stopping that medication altogether and she will use Zofran as needed for nausea.  She started tucatinib and capecitabine on 02/12/2021.  She is tolerating this combination with no unusual side effects.  She receives the trastuzumab every 21 days with her next dose due 04/15/2021.  Her most recent echocardiogram was 01/14/2021.   REVIEW OF SYSTEMS: A detailed review of systems was otherwise   COVID 19 VACCINATION STATUS: Status post Moderna x2, no booster as of December 2021   HISTORY OF CURRENT ILLNESS: From the original intake note:   Monica [name] (pronounced "uric") palpated a right supraclavicular mass and felt tenderness after stretching one day. She brought this to her PA's attention.  The patient underwent bilateral diagnostic mammography with tomography and right breast ultrasonography at [facility] on 12/20/2017 showing: breast density category C. There was a suspicious palpable mass at the 1 o'clock position upper inner quadrant measuring 1.4 x 1.3 x 1.3 cm located 12 cm from the nipple. There was an additional mass at the 8 o'clock radiant measuring 1.2 x 1.0 x 1.4 cm, which likely represented a cyst. Ultrasound evaluation of the right axilla demonstrates no evidence of lymphadenopathy   Accordingly on 12/29/2017 she proceeded to biopsy  of the right breast area in question. The pathology from this procedure showed ([id number]): Invasive  ductal carcinoma, grade III. Prognostic indicators significant for: estrogen receptor, 80% positive and progesterone receptor, 70% positive, both with strong staining intensity. Proliferation marker Ki67 at 80%. HER2 amplified with ratios HER2/CEP17 signals 3.44 and average HER2 copies per cell 10.65.   The patient's subsequent history is as detailed below.   PAST MEDICAL HISTORY: Past Medical History:  Diagnosis Date   Arthritis    lower back   Cancer [facility])    right breast cancer   Family history of breast cancer    Personal history of chemotherapy    Personal history of radiation therapy   She notes that she had a heart murmur as a child, which she grew out of.    PAST SURGICAL HISTORY: Past Surgical History:  Procedure Laterality Date   ABDOMINAL HYSTERECTOMY     APPLICATION OF CRANIAL NAVIGATION N/A 12/06/2020   Procedure: APPLICATION OF CRANIAL NAVIGATION;  Surgeon: Judith Part, MD;  Location: [place];  Service: Neurosurgery;  Laterality: N/A;  RM 20   BREAST LUMPECTOMY Right    BREAST LUMPECTOMY WITH RADIOACTIVE SEED AND SENTINEL LYMPH NODE BIOPSY Right 02/16/2018   Procedure: RIGHT BREAST LUMPECTOMY WITH RADIOACTIVE SEED AND SENTINEL LYMPH NODE BIOPSY;  Surgeon: [name], MD;  Location: [place];  Service: General;  Laterality: Right;   CRANIOTOMY Right 12/06/2020   Procedure: Right sided awake craniotomy for tumor resection;  Surgeon: Judith Part, MD;  Location: [place];  Service: Neurosurgery;  Laterality: Right;   HYMENECTOMY     IR IMAGING GUIDED PORT INSERTION  03/12/2021   IR US GUIDE BX ASP/DRAIN  12/02/2020   KYPHOPLASTY N/A 03/07/2021   Procedure: Thoracic nine, Thoracic ten KYPHOPLASTY;  Surgeon: Judith Part, MD;  Location: [place];  Service: Neurosurgery;  Laterality: N/A;   PORTACATH PLACEMENT Right 02/16/2018   Procedure: INSERTION PORT-A-CATH;  Surgeon: [name], MD;  Location: [place];  Service:  General;  Laterality: Right;   RE-EXCISION OF BREAST LUMPECTOMY Right 02/22/2018   Procedure: RE-EXCISION OF RIGHT  BREAST LUMPECTOMY;  Surgeon: [name], MD;  Location: [place];  Service: General;  Laterality: Right;   REPAIR VAGINAL CUFF N/A 02/07/2017   Procedure: REPAIR VAGINAL CUFF;  Surgeon: Ena Dawley, MD;  Location: [facility];  Service: Gynecology;  Laterality: N/A;   ROBOTIC ASSISTED TOTAL HYSTERECTOMY WITH SALPINGECTOMY Left 01/20/2017   Procedure: ROBOTIC ASSISTED TOTAL HYSTERECTOMY WITH SALPINGECTOMY;  Surgeon: [name], MD;  Location: [facility];  Service: Gynecology;  Laterality: Left;   TOTAL HIP ARTHROPLASTY Right 2022   total hip and partial femur, per pt  Partial Hysterectomy without BSO   FAMILY HISTORY Family History  Problem Relation Age of Onset   Lung cancer Maternal Grandfather    Esophageal cancer Paternal Grandfather 92   Breast cancer Cousin 14  As of July 2019, the patient's father is alive at 12. The patient's mother is also alive at 40. The patient has 1 brother and 5 sisters. There was a maternal grandfather diagnosed with lung cancer at 69. There was a paternal grandfather diagnosed with esophageal cancer at 57. The patient's father had a pre-cancerous esophageal finding at age 39. There was also a maternal 1st cousin diagnosed with metastatic breast cancer at age 4.    GYNECOLOGIC HISTORY:  Patient's last menstrual period was 12/21/2016 (exact date). Menarche: 50 years old Age at first live birth:  50 years old She is GXP2. She is status post partial hysterectomy without BSO in 2018 She never used HRT. She used oral contraception over 21 years ago with no complications.   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 The patient is separated from her husband, [name]. The patient's son [name] is in the [employer] and is stationed in [place] (for 3  years beginning January 2020). He plans on working in the railroad industry after he returns. The patient's daughter [name], age 38, works at Engineer, maintenance    [place]: Not in place; at the 06/20/2020 visit the patient was given the appropriate documents to complete and notarized at her discretion   HEALTH MAINTENANCE: Social History   Tobacco Use   Smoking status: Never   Smokeless tobacco: Never  Vaping Use   Vaping Use: Former  Substance Use Topics   Alcohol use: Not Currently   Drug use: No     Colonoscopy:   PAP: 2018/ prior to hysterectomy  Bone density:   No Known Allergies  Current Outpatient Medications  Medication Sig Dispense Refill   acetaminophen (TYLENOL) 500 MG tablet Take 1 tablet (500 mg total) by mouth 3 (three) times daily as needed for mild pain (or Fever >/= 101). Take with ibuprofen 400 mg     b complex vitamins capsule Take 1 capsule by mouth daily. 900 mg     capecitabine (XELODA) 500 MG tablet Take 1,500 mg by mouth 3 (three) times daily.     Cholecalciferol (VITAMIN D) 125 MCG (5000 UT) CAPS Take 5,000 Units by mouth daily.     dexamethasone (DECADRON) 4 MG tablet Take 1 tablet (4 mg total) by mouth daily. 30 tablet 1   DULoxetine (CYMBALTA) 20 MG capsule TAKE 1 CAPSULE BY MOUTH EVERY DAY 90 capsule 2   ferrous sulfate 325 (65 FE) MG tablet Take 325 mg by mouth daily with breakfast.     fluconazole (DIFLUCAN) 100 MG tablet Take 100 mg by mouth daily.     gabapentin (NEURONTIN) 300 MG capsule Take 1 capsule (300 mg total) by mouth 3 (three) times daily. 90 capsule 6   ibuprofen (ADVIL) 400 MG tablet Take 1 tablet (400 mg total) by mouth 3 (three) times daily. Take with tylenol 500 mg (Patient taking differently: Take 400 mg by mouth 3 (three) times daily as needed for mild pain or moderate pain.) 90 tablet 3   lidocaine (LIDODERM) 5 % Place 1 patch onto the skin daily as needed (for pain- Remove & Discard patch within 12 hours or as directed by  MD).     ondansetron (ZOFRAN ODT) 4 MG disintegrating tablet Take 1 tablet (4 mg total) by mouth See admin instructions. Dissolve 4 mg in the mouth three times a day and an additional 4 mg at bedtime as needed for nausea/vomiting 20 tablet 3   pantoprazole (PROTONIX) 40 MG tablet Take 40 mg by mouth daily. (Patient not taking: Reported on 03/07/2021)     polyethylene glycol (MIRALAX / GLYCOLAX) 17 g packet Take 17 g by mouth daily as needed for moderate constipation. 14 each 0   prochlorperazine (COMPAZINE) 5 MG tablet Take 1 tablet (5 mg total) by mouth every 8 (eight) hours as needed for nausea or vomiting. 90 tablet 4   pyridOXINE (VITAMIN B-6) 100 MG tablet Take 100 mg by mouth daily.     senna-docusate (SENOKOT-S) 8.6-50 MG tablet Take 2 tablets by  mouth 2 (two) times daily.     traMADol (ULTRAM) 50 MG tablet Take 1 tablet (50 mg total) by mouth every 6 (six) hours as needed. 120 tablet 0   traZODone (DESYREL) 50 MG tablet Take 1 tablet (50 mg total) by mouth at bedtime as needed for sleep.     TUKYSA 150 MG tablet Take 150 mg by mouth 2 (two) times daily.     No current facility-administered medications for this visit.   Facility-Administered Medications Ordered in Other Visits  Medication Dose Route Frequency Provider Last Rate Last Admin   sodium chloride flush (NS) 0.9 % injection 10 mL  10 mL Intracatheter Once Romario Tith, [name], MD        OBJECTIVE: White woman examined in a wheelchair  There were no vitals filed for this visit.     There is no height or weight on file to calculate BMI.   Wt Readings from Last 3 Encounters:  03/25/21 161 lb 6.4 oz (73.2 kg)  03/12/21 162 lb (73.5 kg)  03/07/21 162 lb (73.5 kg)  ECOG FS:1  Televisit 03/31/2021 LAB RESULTS:  CMP     Component Value Date/Time   NA 137 03/25/2021 1333   K 3.9 03/25/2021 1333   CL 104 03/25/2021 1333   CO2 22 03/25/2021 1333   GLUCOSE 126 (H) 03/25/2021 1333   BUN 16 03/25/2021 1333   CREATININE 0.68  03/25/2021 1333   CREATININE 0.81 06/20/2020 0839   CALCIUM 9.6 03/25/2021 1333   PROT 6.6 03/25/2021 1333   ALBUMIN 3.4 (L) 03/25/2021 1333   AST 72 (H) 03/25/2021 1333   AST 19 06/20/2020 0839   ALT 39 03/25/2021 1333   ALT 14 06/20/2020 0839   ALKPHOS 186 (H) 03/25/2021 1333   BILITOT 0.6 03/25/2021 1333   BILITOT 0.5 06/20/2020 0839   GFRNONAA >60 03/25/2021 1333   GFRNONAA >60 06/20/2020 0839   GFRAA >60 06/13/2019 1138    Lab Results  Component Value Date   WBC 6.9 03/25/2021   NEUTROABS 5.6 03/25/2021   HGB 9.8 (L) 03/25/2021   HCT 30.6 (L) 03/25/2021   MCV 96.8 03/25/2021   PLT 274 03/25/2021    No results found for: LABCA2  No components found for: XNATFT732  No results for input(s): INR in the last 168 hours.  No results found for: LABCA2  No results found for: CAN199  Lab Results  Component Value Date   CAN125 189.0 (H) 03/25/2021    Lab Results  Component Value Date   CAN153 244.0 (H) 03/25/2021    Lab Results  Component Value Date   CA2729 275.5 (H) 03/25/2021    No components found for: HGQUANT  No results found for: CEA1 / No results found for: CEA1   No results found for: AFPTUMOR  No results found for: CHROMOGRNA  No results found for: TOTALPROTELP, ALBUMINELP, A1GS, A2GS, BETS, BETA2SER, GAMS, MSPIKE, SPEI (this displays SPEP labs)  No results found for: KPAFRELGTCHN, LAMBDASER, KAPLAMBRATIO (kappa/lambda light chains)  No results found for: HGBA, HGBA2QUANT, HGBFQUANT, HGBSQUAN (Hemoglobinopathy evaluation)   No results found for: LDH  Lab Results  Component Value Date   IRON 69 01/16/2021   TIBC 361 01/16/2021   IRONPCTSAT 19 01/16/2021   (Iron and TIBC)  Lab Results  Component Value Date   FERRITIN 2,032 (H) 01/16/2021    Urinalysis    Component Value Date/Time   COLORURINE YELLOW 11/30/2020 1315   APPEARANCEUR HAZY (A) 11/30/2020 1315   LABSPEC 1.009  11/30/2020 1315   PHURINE 5.0 11/30/2020 [place] 11/30/2020 [place] 11/30/2020 [place] 11/30/2020 [place] 11/30/2020 [place] 11/30/2020 1315   NITRITE NEGATIVE 11/30/2020 1315   LEUKOCYTESUR TRACE (A) 11/30/2020 1315    STUDIES: DG THORACOLUMABAR SPINE  Result Date: 03/07/2021 CLINICAL DATA:  T9 and T10 kyphoplasties. EXAM: THORACOLUMBAR SPINE 1V Radiation exposure index: 78.61 mGy. COMPARISON:  September 13, 2020. FINDINGS: Four intraoperative fluoroscopic images were obtained of the lower thoracic spine. These demonstrate the patient be status post kyphoplasty of T9 and T10 vertebral bodies. IMPRESSION: Fluoroscopic guidance provided during T9 and T10 kyphoplasty. Electronically Signed   By: Marijo Conception M.D.   On: 03/07/2021 11:34   MR Brain W Wo Contrast  Result Date: 03/07/2021 CLINICAL DATA:  Neoplasm surveillance EXAM: MRI HEAD WITHOUT AND WITH CONTRAST TECHNIQUE: Multiplanar, multiecho pulse sequences of the brain and surrounding structures were obtained without and with intravenous contrast. CONTRAST:  6.7mL GADAVIST GADOBUTROL 1 MMOL/ML IV SOLN COMPARISON:  12/07/2020, 12/02/2020 FINDINGS: Brain: Status post right temporoparietal craniotomy with subjacent resection cavity, which has collapsed since the prior exam. Minimal enhancement is noted along the resection margins. Interval decrease in surrounding T2 hyperintense signal. Additional enhancing lesions, with comparison to 12/02/2020: - 3 mm lesion in the anterior left frontal lobe (series 1100, image 231), previously 4 mm. - 6 mm enhancing lesion in the posterior right frontal lobe (series 1100, image 244), previously 6 mm. - 11 x 6 mm lesion in the posterior right parietal lobe (series 1100, image 192), previously 22 x 12 mm. - 2 mm lesion in the left posterior cerebellum (series 1100, image 134), previously 9 x 8 mm. - Previously noted lesion in the medial right cerebellar hemisphere is no longer  seen. - Punctate lesion in the left posterior frontal lobe is no longer seen. - Previously noted lesion in the right basal ganglia is no longer seen. No new enhancing lesions. No mass effect or midline shift. No extra-axial fluid collection, acute hemorrhage, acute infarct, or hydrocephalus. Vascular: Normal flow voids. Skull and upper cervical spine: Prior right temporal craniotomy. Bifrontal calvarial lesions have increased in size, now measuring approximately 4.0 x 1.6 cm on the right (series 1100, image 252), previously up to 1.5 cm when remeasured similarly. Abnormal left frontal calvarial enhancement measures up to 4.9 cm (series 1100, image 246), previously up to 2.4 cm. New enhancing lesions in the high right frontal calvarium (series 1100, image 268), measuring up to 1.2 cm, and left frontal calvarium along the left frontal sinus (series 1100, image 206), measuring to 1.2 cm. In the left parietal calvarium, a previously noted lesion now measures up to 2.9 cm (series 1100, image 227), previously up to 1.5 cm. New lesions more posteriorly, measuring up to 1.0 cm (series 1100, image 231) and 0.8 cm (series 1100, image 199). New area of enhancement in the right parietal calvarium (series 1100, image 208), measuring up to 0.8 cm. Abnormal focus of enhancement in the left anterior arch of C1 (series 1100, image 103), left occipital condyle (series 1100, image 112), and left aspect of the skull base (series 1100, image 121). Sinuses/Orbits: Negative. Other: None. IMPRESSION: 1. Status post right temporoparietal craniotomy and resection of subjacent lesion, with interval collapse of the resection cavity. Minimal enhancement along the resection margin may be postsurgical, although residual or recurrent tumor cannot be excluded. Attention on follow-up. 2. Additional previously  noted enhancing parenchymal lesions are stable or decreased in size; some of them are no longer seen. No new enhancing parenchymal lesions. 3.  Interval increase in the size and number of multiple calvarial lesions, including lesions in the left occipital condyle and left anterior arch of C1. Electronically Signed   By: Merilyn Baba M.D.   On: 03/07/2021 15:09   DG C-Arm 1-60 Min-No Report  Result Date: 03/07/2021 CLINICAL DATA:  T9 and T10 kyphoplasties. EXAM: THORACOLUMBAR SPINE 1V Radiation exposure index: 78.61 mGy. COMPARISON:  September 13, 2020. FINDINGS: Four intraoperative fluoroscopic images were obtained of the lower thoracic spine. These demonstrate the patient be status post kyphoplasty of T9 and T10 vertebral bodies. IMPRESSION: Fluoroscopic guidance provided during T9 and T10 kyphoplasty. Electronically Signed   By: Marijo Conception M.D.   On: 03/07/2021 11:34   IR IMAGING GUIDED PORT INSERTION  Result Date: 03/12/2021 INDICATION: Metastatic breast cancer. Patient presents for placement of port catheter to facilitate chemotherapy. EXAM: IMPLANTED PORT A CATH PLACEMENT WITH ULTRASOUND AND FLUOROSCOPIC GUIDANCE MEDICATIONS: None. ANESTHESIA/SEDATION: Versed 2 mg IV; Fentanyl 100 mcg IV; Moderate Sedation Time:  20 minutes The patient was continuously monitored during the procedure by the interventional radiology nurse under my direct supervision. FLUOROSCOPY TIME:  0 minutes, 30 seconds (4 mGy) COMPLICATIONS: None immediate. PROCEDURE: The right neck and chest was prepped with chlorhexidine, and draped in the usual sterile fashion using maximum barrier technique (cap and mask, sterile gown, sterile gloves, large sterile sheet, hand hygiene and cutaneous antiseptic). Local anesthesia was attained by infiltration with 1% lidocaine with epinephrine. Ultrasound demonstrated patency of the right internal jugular vein, and this was documented with an image. Under real-time ultrasound guidance, this vein was accessed with a 21 gauge micropuncture needle and image documentation was performed. A small dermatotomy was made at the access site with an  11 scalpel. A 0.018" wire was advanced into the SVC and the access needle exchanged for a 80F micropuncture vascular sheath. The 0.018" wire was then removed and a 0.035" wire advanced into the IVC. An appropriate location for the subcutaneous reservoir was selected below the clavicle and an incision was made through the skin and underlying soft tissues. The subcutaneous tissues were then dissected using a combination of blunt and sharp surgical technique and a pocket was formed. A single lumen power injectable portacatheter was then tunneled through the subcutaneous tissues from the pocket to the dermatotomy and the port reservoir placed within the subcutaneous pocket. The venous access site was then serially dilated and a peel away vascular sheath placed over the wire. The wire was removed and the port catheter advanced into position under fluoroscopic guidance. The catheter tip is positioned in the superior cavoatrial junction. This was documented with a spot image. The portacatheter was then tested and found to flush and aspirate well. The port was flushed with saline followed by 100 units/mL heparinized saline. The pocket was then closed using subdermal inverted interrupted absorbable sutures followed by Dermabond. The dermatotomy at the venous access site was also closed with Dermabond. IMPRESSION: Successful placement of a right IJ approach Power Port with ultrasound and fluoroscopic guidance. The catheter is ready for use. Electronically Signed   By: Jacqulynn Cadet M.D.   On: 03/12/2021 13:15      ELIGIBLE FOR AVAILABLE RESEARCH PROTOCOL: BCEP  ASSESSMENT: 50 y.o.  [place], [place] woman status post right breast upper inner quadrant biopsy 12/29/2017, for a clinical T1c N0, stage IA invasive ductal carcinoma, triple  positive, with an MIB-1 of 80%.  (1) genetics testing 02/02/2018 though the CancerNext gene panel offered by [facility] showed no deleterious mutations in  APC, ATM, BARD1,  BMPR1A,BRCA1, BRCA2, BRIP1, CDH1, CDK4, CDKN2A, CHEK2, DICER1, HOXB13, MLH1, MRE11A, MSH2, MSH6, MUTYH, NBN, NF1, PALB2, PMS2, POLD1, POLE, PTEN, RAD50, RAD51C, RAD51D, SMAD4, SMARCA4, STK11 and TP53 (sequencing and deletion/duplication); EPCAM and GREM1 (deletion/duplication only).  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>A) )   (2) right lumpectomy and sentinel lymph node sampling 02/16/2018 showed a pT2 pN0, stage IB invasive ductal carcinoma, grade 3, with a positive inferior margin; a total of 5 lymph nodes were removed  (a) additional surgery 02/27/2018 cleared the margins  (3) started carboplatin, docetaxel, trastuzumab and pertuzumab 03/11/2018, repeated every 21 days x 6, last dose 07/18/2018.    (a) Pertuzumab omitted after cycle 1 due to diarrhea.  (b) Gemcitabine substituted for docetaxel starting with cycle 5 due to peripheral neuropathy  (4) continued trastuzumab to total 6 months (through February 2020).  (a) echocardiogram 01/21/2018 showed an ejection fraction in the 55-60% range  (b) repeat echocardiogram 06/14/2018 showed an ejection fraction in the 60-65%  (5) adjuvant radiation 08/15/2018 - 09/28/2018  Site/dose:   The patient initially received a dose of 50.4 Gy in 28 fractions to the right breast using whole-breast tangent fields. This was delivered using a 3-D conformal technique. The patient then received a boost to the seroma. This delivered an additional 10 Gy in 5 fractions using 12E, 9E electrons with a special teletherapy technique. The total dose was 60.4 Gy.   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, involving brain, liver, lungs, and bones (7) presenting 11/29/2020 with progressive back pain:             (a) non-contrast CT  abd/pelvis (renal stone study) 11/29/2020 shows bone metastases             (b) CT chest/abd/pelvis with contrast 11/30/2020 shows multiple large liver masses, multiple pulmonary nodules, and widespread lytic bone lesions             (c) MRI brain shows at least 4 brain lesions, largest 3.8 cm             (d) total spinal MRI 11/30/2020 shows pathologic fractures at T9, T12, possibly L2, as well as numerous other spine lesions, but no epidural enhancement or cord compression             (e) liver biopsy 12/02/2020 shows adenocarcinoma, prognostic panel again triple positive             (f) pre-op repeat MRI 12/01/2020 shows additional brain lesions, at least 8 of which will be targets for SRS   (8) CNS treatment:             (a) [facility] 12/05/2020             (b) surgery 12/06/2020 confirmed metastatic carcinoma, estrogen receptor positive, HER2 amplified, progesterone receptor negative, with an MIB-1 of 70%.  (c) brain MRI 03/06/2021 shows minimal enhancement along the resection margin, previously noted lesions stable or decreased in size, some no longer seen, and no new brain lesions   (9) XRT to spine 12/05/2020 through 12/23/2020 Site Technique Total Dose (Gy) Dose per Fx (Gy) Completed  Fx Beam Energies  Thoracic Spine: Spine_T9-L3 Complex 30/30 3 10/10 15X  Pelvis: Pelvis_sacrum Complex 30/30 3 10/10 15X   (10) advanced directives-- plans to name son as HCPOA with her father as secondary   (70) started capecitabine 01/08/2021, trastuzumab 01/20/2021, tucatinib 02/10/2021  (a) echo 01/14/2021 shows an ejection fraction in the 60-65% range.  (12) started denosumab/Xgeva 02/10/2021, repeated every 6 weeks  (a) received one dose pamidronate in hospital 01/16/2021  (13) pain management:  (a) Tylenol 500 mg and Advil 400 mg 3 times a day with food  (b) gabapentin 3 times a day as needed  (c) tramadol 50 mg 4 times a day as needed if above not adequate  (d) bowel prophylaxis: MiraLAX daily,  stool softeners 2 tablets twice daily  (14) status post T9 and T10 kyphoplasty 03/07/2021   PLAN: [place] is now 4 months out from diagnosis of metastatic breast cancer.  She has had surgery for the main brain lesion and kyphoplasty to the painful areas in the mid thoracic spine.  She is tolerating the current treatment generally well, namely tucatinib, capecitabine, and trastuzumab, and we are seeing a gratifying drop in the tumor markers.  We still have not seen a significant improvement in overall functional status.  She continues to have significant pain and nausea problems and is pretty much wheelchair-bound.  We are continuing to change the medications as noted above.  I am hoping by the time she returns to see me 04/15/2021 we can simplify her regimen even further, and start considering physical therapy.  Total encounter time 20 minutes.Sarajane Jews C. Natalye Kott, MD  04/02/21 3:15 PM [facility] [place] [address], [place] [address] Tel. [phone]    Fax. [phone]   I, Wilburn Mylar, am acting as scribe for Dr. [name]. Janiene Aarons.  I, Lurline Del MD, have reviewed the above documentation for accuracy and completeness, and I agree with the above.   *Total Encounter Time as defined by the Centers for Medicare and Medicaid Services includes, in addition to the face-to-face time of a patient visit (documented in the note above) non-face-to-face time: obtaining and reviewing outside history, ordering and reviewing medications, tests or procedures, care coordination (communications with other health care professionals or caregivers) and documentation in the medical record.

## 2021-04-02 NOTE — Addendum Note (Signed)
Addended by: Chauncey Cruel on: 04/02/2021 03:22 PM   Modules accepted: Level of Service

## 2021-04-02 NOTE — Telephone Encounter (Signed)
This RN spoke with per her call stating onset greater then 1 weak of " my muscles are like jumping and feeling like I am crawling out of my skin when I am awake " " Almost like a panic attack but I don't feel panic " - she states overall continued flat affect.  Pt states over the past week symptoms have seemed to increase - but she did forget to tell MD per phone visit on Monday.  She is presently taking compazine 5 mg tid ( decreased from 10 mg )  She does not have lorazepam or alprazolam per MAR.  Per MD review- recommended for the patient to stop the compazine- she can use zofran for nausea as well as lorazepam prescription will be sent to her pharmacy.  This RN discussed above with pt including dosing of 4 mg zofran at higher dose if needed and use of the lorazepam for symptom relief and or nausea with noted side effect of sleepiness.  Monica [name] verbalized understanding.  No further needs at present.

## 2021-04-07 ENCOUNTER — Telehealth: Payer: Self-pay | Admitting: *Deleted

## 2021-04-07 NOTE — Telephone Encounter (Signed)
This RN spoke with pt per her call this AM- stating overall improvement in dyskinesia ( primarily the " shaking and tremors of muscles have stopped ) since stopping the compazine and initiating use of the lorazepam- she states her nausea is well controlled at this time.  Current decadron dose is " 1 tablet a day ".  She does state she is continuing to notice issues with tactile movement- including " like trying to write checks or brush my teeth"  She is ambulating ok with use of walker- and overall balance is well - issue is more with pain with ambulation not balance.  Speech and thought process are intact.  She is presently taking 1 lorazepam bid- and is asking about how to titrate lower.  This RN informed her to go to 1/2 tab bid for 2 days then stop.  She states she went to a new PCP Friday - who recommended she take vitamin d as well as " stated I am pre-diabetic "  She is asking about use of vitamin d- with this RN stating appropriate to take.  Her " prediabetic " may be related to use of the decadron and this RN will inquire about possible decrease in dose per MD.  She states she has increased " watering in eyes " with some visual changes noted as well.  This RN informed her of use of thera tears for possible imbalance in vicious fluid as well as will inform MD.  Pt has no other issues at present.  This note will be given to MD for review and further recommendations.

## 2021-04-09 ENCOUNTER — Other Ambulatory Visit: Payer: Self-pay | Admitting: Oncology

## 2021-04-09 ENCOUNTER — Telehealth: Payer: Self-pay | Admitting: *Deleted

## 2021-04-09 DIAGNOSIS — Z170 Estrogen receptor positive status [ER+]: Secondary | ICD-10-CM

## 2021-04-09 DIAGNOSIS — C50211 Malignant neoplasm of upper-inner quadrant of right female breast: Secondary | ICD-10-CM

## 2021-04-09 NOTE — Telephone Encounter (Signed)
This RN called pt to follow up on call from this past Monday for status.  Monica [name] states she is " just depressed and have no motivation to do anything - just bored "  She is " setting alarms " to do things around the home.  She states nausea is well controlled off the compazine.  She is still having issues with dyskinesia in her hands interfering with ADL's ( per description like someone with cerebral palsy ).  She is having continued visual changes as well.  She states decreased desire with eating ( not associated with nausea) .  She is presently taking 4 mg decadron daily and 0.25 mg lorazepam bid.  This RN asked her if she felt more motivated what activities would she normally do- with patient stating she would read, or paint or do some other creative activities.  This RN informed above information would be given to MD for review.

## 2021-04-10 ENCOUNTER — Encounter: Payer: Self-pay | Admitting: Oncology

## 2021-04-13 ENCOUNTER — Other Ambulatory Visit: Payer: Self-pay | Admitting: Oncology

## 2021-04-14 ENCOUNTER — Other Ambulatory Visit: Payer: Self-pay

## 2021-04-14 ENCOUNTER — Ambulatory Visit (HOSPITAL_COMMUNITY)
Admission: RE | Admit: 2021-04-14 | Discharge: 2021-04-14 | Disposition: A | Discharge status: Home / Self Care | Payer: Medicaid Other | Source: Ambulatory Visit | Attending: Oncology | Admitting: Oncology

## 2021-04-14 ENCOUNTER — Encounter (HOSPITAL_COMMUNITY): Payer: Self-pay

## 2021-04-14 DIAGNOSIS — C787 Secondary malignant neoplasm of liver and intrahepatic bile duct: Secondary | ICD-10-CM | POA: Diagnosis present

## 2021-04-14 DIAGNOSIS — C7931 Secondary malignant neoplasm of brain: Secondary | ICD-10-CM | POA: Diagnosis present

## 2021-04-14 DIAGNOSIS — Z170 Estrogen receptor positive status [ER+]: Secondary | ICD-10-CM | POA: Insufficient documentation

## 2021-04-14 DIAGNOSIS — Z7189 Other specified counseling: Secondary | ICD-10-CM | POA: Diagnosis present

## 2021-04-14 DIAGNOSIS — C7802 Secondary malignant neoplasm of left lung: Secondary | ICD-10-CM | POA: Diagnosis present

## 2021-04-14 DIAGNOSIS — C7951 Secondary malignant neoplasm of bone: Secondary | ICD-10-CM | POA: Diagnosis present

## 2021-04-14 DIAGNOSIS — C7989 Secondary malignant neoplasm of other specified sites: Secondary | ICD-10-CM

## 2021-04-14 DIAGNOSIS — C7801 Secondary malignant neoplasm of right lung: Secondary | ICD-10-CM | POA: Diagnosis present

## 2021-04-14 DIAGNOSIS — C50211 Malignant neoplasm of upper-inner quadrant of right female breast: Secondary | ICD-10-CM | POA: Diagnosis present

## 2021-04-14 IMAGING — CT CT CHEST W/ CM
2 of 4 series · 15 of 36 positions shown, 18 images · IV contrast (OMNIPAQUE)
Comparison: [DATE].

CLINICAL DATA: Breast cancer. Chemotherapy and radiation and 2
weeks ago.

EXAM:
CT CHEST WITH CONTRAST
TECHNIQUE: Multidetector CT imaging of the chest was performed during
intravenous contrast administration.
CONTRAST:  60mL OMNIPAQUE IOHEXOL 350 MG/ML SOLN

[Series 2: axial st · axial · 0.73mm/px · z∈[+681,+967]mm · 12 of 169 slices shown, 15 images]
[im 13/169  mediastinal]
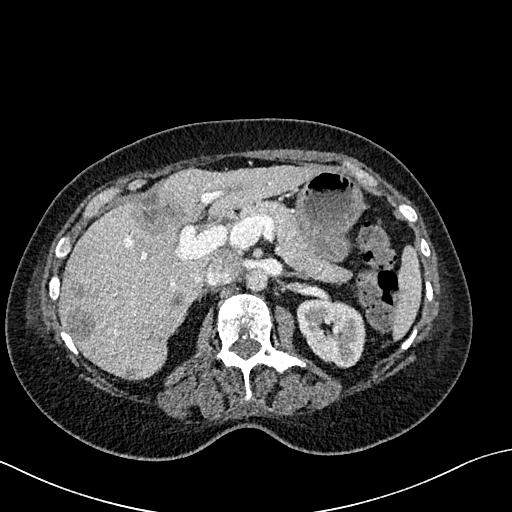
[im 13/169  lung]
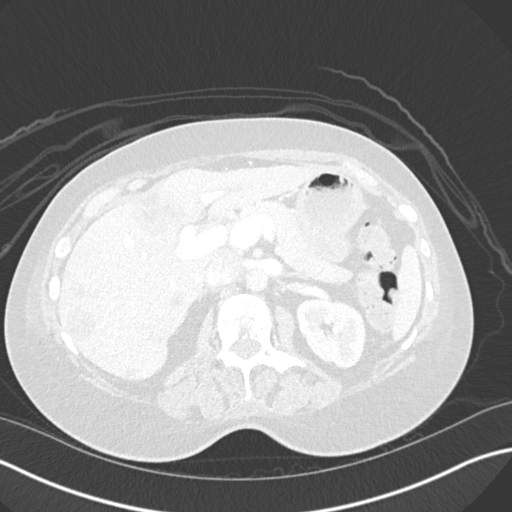
[im 26/169  lung]
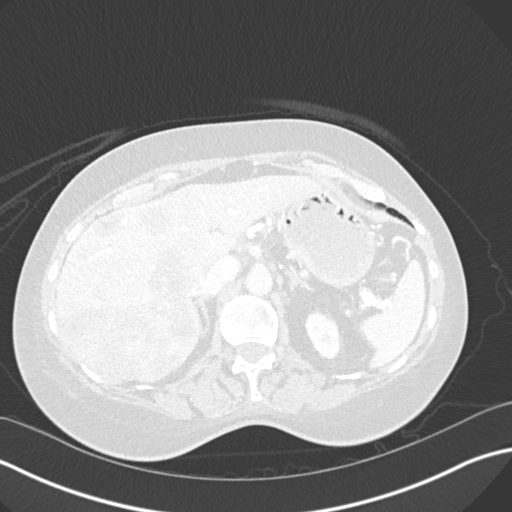
[im 39/169  lung]
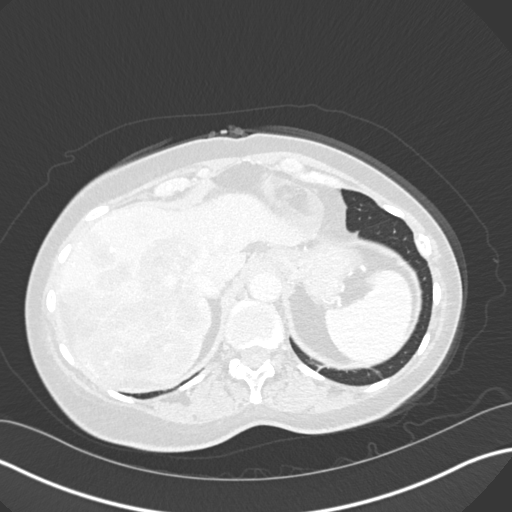
[im 52/169  lung]
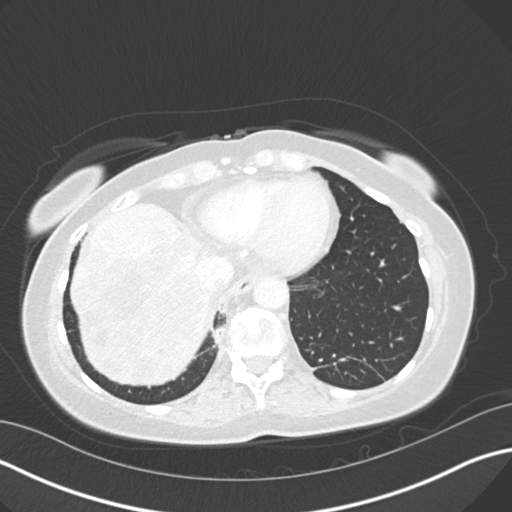
[im 65/169  mediastinal]
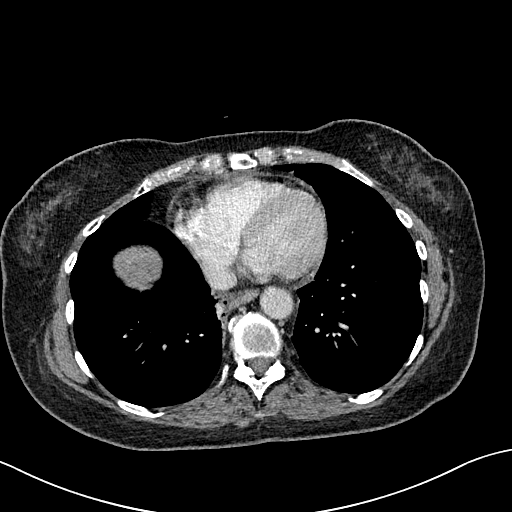
[im 65/169  lung]
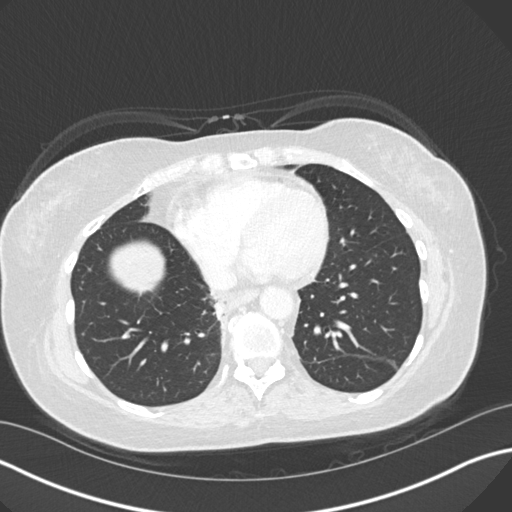
[im 78/169  lung]
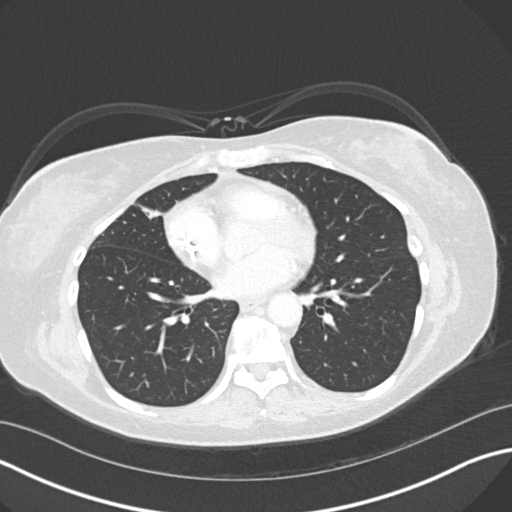
[im 91/169  lung]
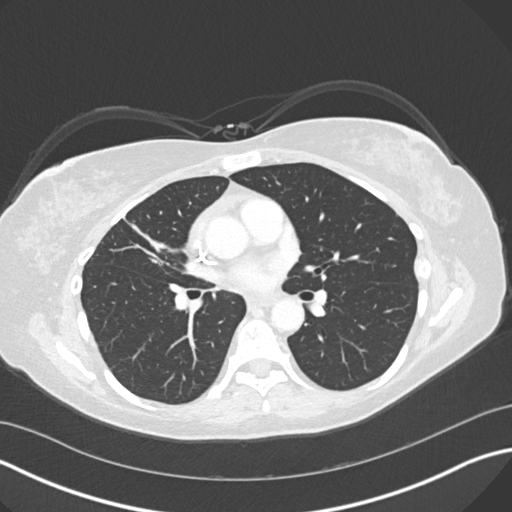
[im 104/169  lung]
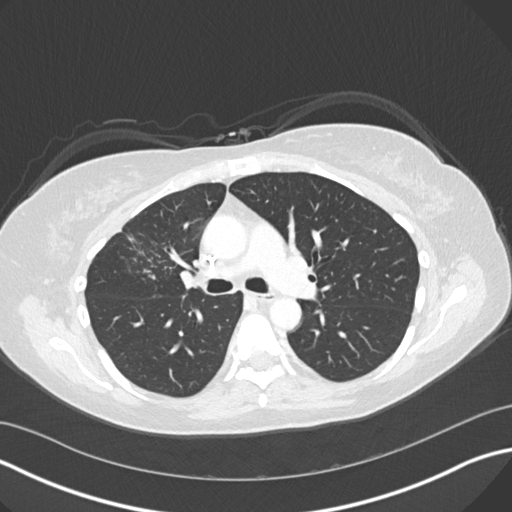
[im 117/169  mediastinal]
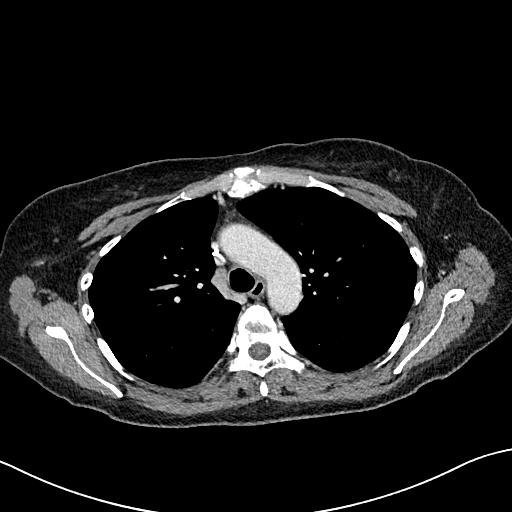
[im 117/169  lung]
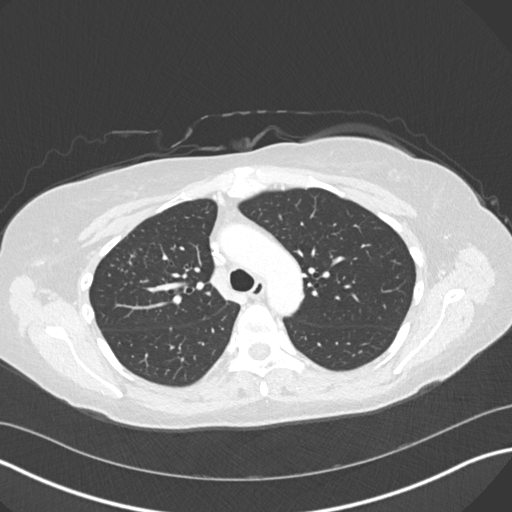
[im 130/169  lung]
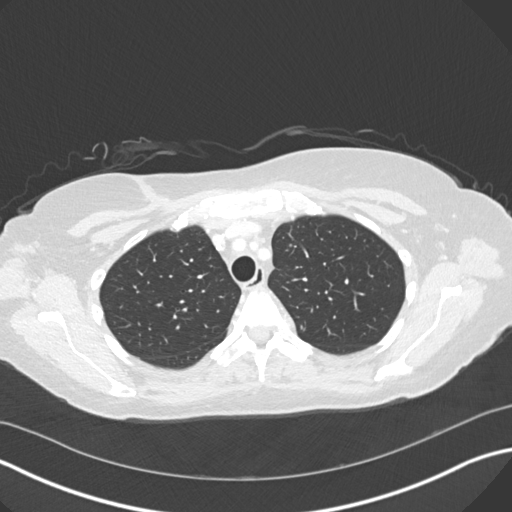
[im 143/169  lung]
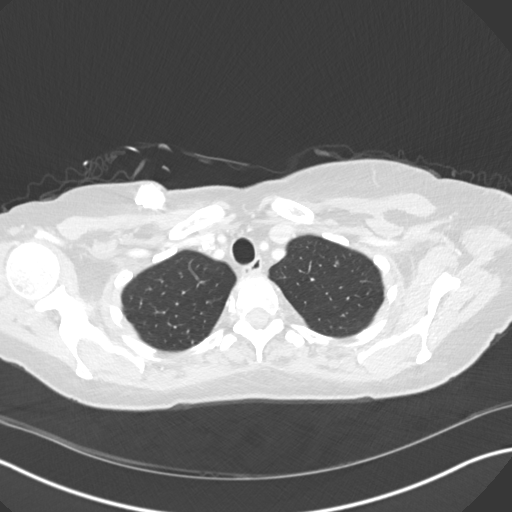
[im 156/169  lung]
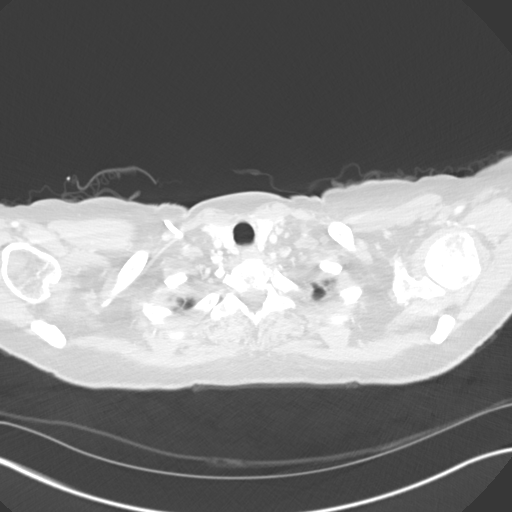

[Series 6: coronal · coronal · 0.68mm/px · 3 of 119 slices shown]
[im 24/119  lung]
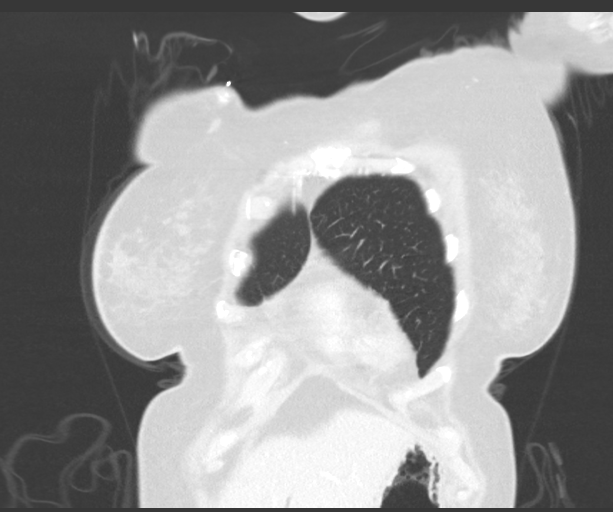
[im 48/119  lung]
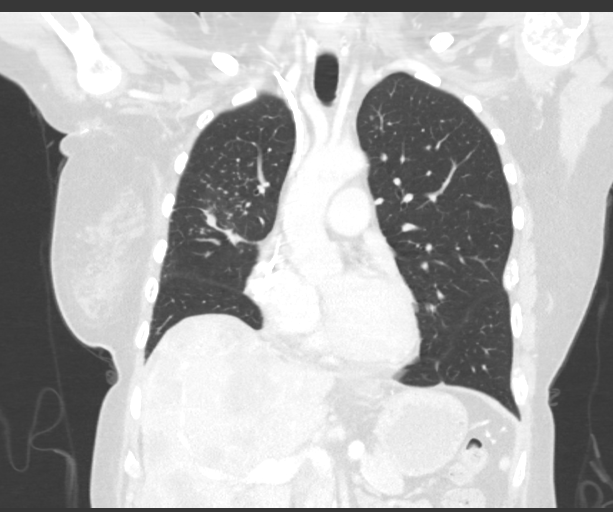
[im 71/119  lung]
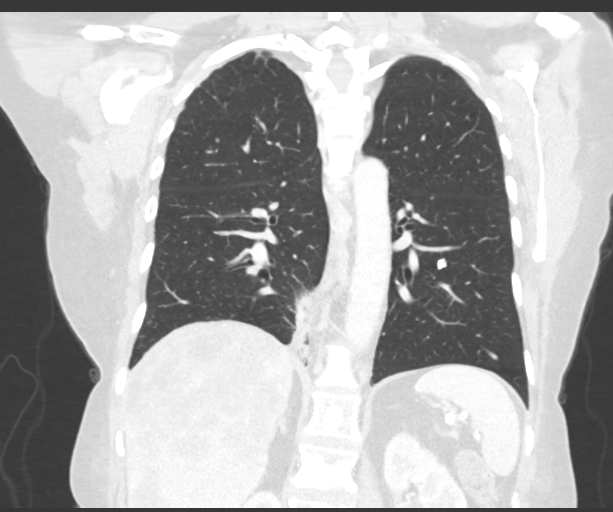

[15 of 36 positions shown; findings below may reference images not displayed]

FINDINGS: Cardiovascular: Right IJ Port-A-Cath terminates in the right atrium.
Heart size normal. Small pericardial effusion is new.

Mediastinum/Nodes: Low internal jugular lymph nodes are not enlarged
by CT size criteria. No pathologically enlarged mediastinal, hilar,
internal mammary or axillary lymph nodes. Surgical clips in the
right axilla. Esophagus is grossly unremarkable.

Lungs/Pleura: Mild biapical pleuroparenchymal scarring. Nodular
consolidation in the right middle lobe has progressed but is
difficult to measure given adjacent volume loss. Surrounding
peribronchovascular nodularity. Additional bilateral pulmonary
nodules measure up to 1.5 cm in the right lower lobe (5/101),
similar. No pleural fluid. Airway is unremarkable.

Upper Abdomen: New and enlarging heterogeneous lesions in the liver.
Index lesion in the central aspect of the liver, spanning the left
and right hepatic lobes, now measures 5.8 x 6.2 cm (2/128),
previously 4.0 x 5.0 cm. Enlarged portal vein. Visualized portions
of adrenal glands, kidneys, spleen, pancreas, stomach and bowel are
grossly unremarkable.

Musculoskeletal: Progressive mottled sclerosis throughout the
visualized osseous structures. New pathologic fractures of the left
second, sixth and eighth ribs. T9 and T10 vertebral body
augmentations. New compression deformities involving T1, T2, T4, T6
and T7. Progressive compression deformities involving T11 and T12.
L1 inferior endplate compression fracture and L2 superior endplate
compression fracture, new. Pathologic manubrial fracture.
IMPRESSION: 1. Interval progression of hepatic and osseous metastatic disease
with multiple new pathologic fractures throughout the axial and
appendicular skeleton.
2. Increasing nodular consolidation in the right middle lobe, also
worrisome for some for disease progression
3. New small pericardial effusion.

## 2021-04-14 MED ORDER — HEPARIN SOD (PORK) LOCK FLUSH 100 UNIT/ML IV SOLN
INTRAVENOUS | Status: AC
  Filled 2021-04-14: qty 5

## 2021-04-14 MED ORDER — HEPARIN SOD (PORK) LOCK FLUSH 100 UNIT/ML IV SOLN
500.0000 [IU] | Freq: Once | INTRAVENOUS | Status: AC
  Administered 2021-04-14: 500 [IU] via INTRAVENOUS

## 2021-04-14 MED ORDER — IOHEXOL 350 MG/ML SOLN
60.0000 mL | Freq: Once | INTRAVENOUS | Status: AC | PRN
  Administered 2021-04-14: 60 mL via INTRAVENOUS

## 2021-04-15 ENCOUNTER — Inpatient Hospital Stay (HOSPITAL_BASED_OUTPATIENT_CLINIC_OR_DEPARTMENT_OTHER): Payer: Medicaid Other | Admitting: Oncology

## 2021-04-15 ENCOUNTER — Inpatient Hospital Stay: Payer: Medicaid Other | Attending: Oncology

## 2021-04-15 ENCOUNTER — Other Ambulatory Visit: Payer: Self-pay

## 2021-04-15 ENCOUNTER — Inpatient Hospital Stay: Payer: Medicaid Other

## 2021-04-15 VITALS — BP 130/82 | HR 83 | Temp 98.0°F | Wt 155.6 lb

## 2021-04-15 DIAGNOSIS — C787 Secondary malignant neoplasm of liver and intrahepatic bile duct: Secondary | ICD-10-CM

## 2021-04-15 DIAGNOSIS — Z170 Estrogen receptor positive status [ER+]: Secondary | ICD-10-CM | POA: Diagnosis not present

## 2021-04-15 DIAGNOSIS — C50211 Malignant neoplasm of upper-inner quadrant of right female breast: Secondary | ICD-10-CM | POA: Diagnosis not present

## 2021-04-15 DIAGNOSIS — C7940 Secondary malignant neoplasm of unspecified part of nervous system: Secondary | ICD-10-CM | POA: Diagnosis not present

## 2021-04-15 DIAGNOSIS — M8450XA Pathological fracture in neoplastic disease, unspecified site, initial encounter for fracture: Secondary | ICD-10-CM | POA: Insufficient documentation

## 2021-04-15 DIAGNOSIS — C7989 Secondary malignant neoplasm of other specified sites: Secondary | ICD-10-CM

## 2021-04-15 DIAGNOSIS — Z79899 Other long term (current) drug therapy: Secondary | ICD-10-CM | POA: Diagnosis not present

## 2021-04-15 DIAGNOSIS — Z800 Family history of malignant neoplasm of digestive organs: Secondary | ICD-10-CM | POA: Diagnosis not present

## 2021-04-15 DIAGNOSIS — C7931 Secondary malignant neoplasm of brain: Secondary | ICD-10-CM

## 2021-04-15 DIAGNOSIS — C7951 Secondary malignant neoplasm of bone: Secondary | ICD-10-CM | POA: Insufficient documentation

## 2021-04-15 DIAGNOSIS — D496 Neoplasm of unspecified behavior of brain: Secondary | ICD-10-CM

## 2021-04-15 DIAGNOSIS — R110 Nausea: Secondary | ICD-10-CM | POA: Insufficient documentation

## 2021-04-15 DIAGNOSIS — I3139 Other pericardial effusion (noninflammatory): Secondary | ICD-10-CM | POA: Diagnosis not present

## 2021-04-15 DIAGNOSIS — Z90710 Acquired absence of both cervix and uterus: Secondary | ICD-10-CM | POA: Insufficient documentation

## 2021-04-15 DIAGNOSIS — Z801 Family history of malignant neoplasm of trachea, bronchus and lung: Secondary | ICD-10-CM | POA: Insufficient documentation

## 2021-04-15 DIAGNOSIS — Z95828 Presence of other vascular implants and grafts: Secondary | ICD-10-CM

## 2021-04-15 DIAGNOSIS — Z5112 Encounter for antineoplastic immunotherapy: Secondary | ICD-10-CM | POA: Insufficient documentation

## 2021-04-15 DIAGNOSIS — Z7189 Other specified counseling: Secondary | ICD-10-CM

## 2021-04-15 LAB — CBC WITH DIFFERENTIAL/PLATELET
Abs Immature Granulocytes: 0.23 10*3/uL — ABNORMAL HIGH (ref 0.00–0.07)
Basophils Absolute: 0 10*3/uL (ref 0.0–0.1)
Basophils Relative: 0 %
Eosinophils Absolute: 0 10*3/uL (ref 0.0–0.5)
Eosinophils Relative: 0 %
HCT: 32.7 % — ABNORMAL LOW (ref 36.0–46.0)
Hemoglobin: 10.8 g/dL — ABNORMAL LOW (ref 12.0–15.0)
Immature Granulocytes: 1 %
Lymphocytes Relative: 1 %
Lymphs Abs: 0.2 10*3/uL — ABNORMAL LOW (ref 0.7–4.0)
MCH: 32.3 pg (ref 26.0–34.0)
MCHC: 33 g/dL (ref 30.0–36.0)
MCV: 97.9 fL (ref 80.0–100.0)
Monocytes Absolute: 0.7 10*3/uL (ref 0.1–1.0)
Monocytes Relative: 5 %
Neutro Abs: 14.8 10*3/uL — ABNORMAL HIGH (ref 1.7–7.7)
Neutrophils Relative %: 93 %
Platelets: 307 10*3/uL (ref 150–400)
RBC: 3.34 MIL/uL — ABNORMAL LOW (ref 3.87–5.11)
RDW: 22.6 % — ABNORMAL HIGH (ref 11.5–15.5)
WBC: 16 10*3/uL — ABNORMAL HIGH (ref 4.0–10.5)
nRBC: 0 % (ref 0.0–0.2)

## 2021-04-15 LAB — COMPREHENSIVE METABOLIC PANEL
ALT: 88 U/L — ABNORMAL HIGH (ref 0–44)
AST: 61 U/L — ABNORMAL HIGH (ref 15–41)
Albumin: 3.5 g/dL (ref 3.5–5.0)
Alkaline Phosphatase: 140 U/L — ABNORMAL HIGH (ref 38–126)
Anion gap: 11 (ref 5–15)
BUN: 24 mg/dL — ABNORMAL HIGH (ref 6–20)
CO2: 21 mmol/L — ABNORMAL LOW (ref 22–32)
Calcium: 8.9 mg/dL (ref 8.9–10.3)
Chloride: 104 mmol/L (ref 98–111)
Creatinine, Ser: 0.74 mg/dL (ref 0.44–1.00)
GFR, Estimated: >60 mL/min (ref >60)
Glucose, Bld: 140 mg/dL — ABNORMAL HIGH (ref 70–99)
Potassium: 3.9 mmol/L (ref 3.5–5.1)
Sodium: 136 mmol/L (ref 135–145)
Total Bilirubin: 0.7 mg/dL (ref 0.3–1.2)
Total Protein: 6.4 g/dL — ABNORMAL LOW (ref 6.5–8.1)

## 2021-04-15 MED ORDER — SODIUM CHLORIDE 0.9% FLUSH
10.0000 mL | INTRAVENOUS | Status: DC | PRN
  Administered 2021-04-15: 10 mL via INTRAVENOUS

## 2021-04-15 MED ORDER — DEXAMETHASONE 4 MG PO TABS: 8.0000 mg | ORAL_TABLET | Freq: Every day | ORAL | 1 refills | Status: DC

## 2021-04-15 MED ORDER — VALACYCLOVIR HCL 1 G PO TABS: 1000.0000 mg | ORAL_TABLET | Freq: Two times a day (BID) | ORAL | 6 refills | Status: DC

## 2021-04-15 NOTE — Progress Notes (Signed)
[name]  [phone] [phone]     ID: Monica [name] DOB: [birth year]  MR#: [id number]  ZGY#:[id number]  Patient Care Team: Beverley Fiedler, [place] as PCP - General (Endocrinology) [name], MD as Consulting Physician (General Surgery) Ferrell Claiborne, [name], MD as Consulting Physician (Oncology) [name], MD as Consulting Physician (Radiation Oncology) [name], MD as Consulting Physician (Obstetrics and Gynecology) Larey Dresser, MD as Consulting Physician (Cardiology) Janice Coffin, MD as Referring Physician (Orthopedic Surgery) Judith Part, MD as Consulting Physician (Neurosurgery) Raina Mina, RPH-CPP (Pharmacist) Ventura Sellers, MD as Consulting Physician (Psychiatry) Judith Part, MD as Consulting Physician (Neurosurgery) OTHER MD: [facility]  CHIEF COMPLAINT: triple positive breast cancer  CURRENT TREATMENT: tucatinib, capecitabine, trastuzumab; denosumab [place] See   INTERVAL HISTORY: Monica [name] returns today for follow up of her triple positive breast cancer.  She is accompanied by her sister  Since her last visit, she underwent restaging chest CT 04/14/2021, showing: interval progression of hepatic and osseous metastatic disease with multiple new pathologic fractures through the skeleton; increasing nodular consolidation in right middle lobe; new small pericardial effusion.  She started tucatinib and capecitabine on 02/12/2021.  She is tolerating this combination with no unusual side effects.  She receives the trastuzumab every 21 days with a dose due today.  Her most recent echocardiogram was 01/14/2021.   REVIEW OF SYSTEMS: Ardath tells me she is ready to "give it up".  She sits alone all day just waiting for the alarm to ring so she can take another set of pills.  She would like to stop all treatment.  In addition she has significant mouth sores which are painful.  Aside from that  the bony and joint pain that she had previously in her back and other places is very well controlled.  Her nausea also is very well controlled currently.  She is off the Compazine which was causing her to feel "completely out of it".  She is tolerating the dexamethasone well although this may be what precipitated the mouth sores.  She has had no falls and is able to walk in the house sometimes without the aid of a walker.  She says she cannot see very well so cannot paint.  She has no headaches.  She admits to being depressed.  A detailed review of systems was otherwise stable   COVID 19 VACCINATION STATUS: Status post Moderna x2, no booster as of December 2021   HISTORY OF CURRENT ILLNESS: From the original intake note:   Monica [name] (pronounced "uric") palpated a right supraclavicular mass and felt tenderness after stretching one day. She brought this to her PA's attention.  The patient underwent bilateral diagnostic mammography with tomography and right breast ultrasonography at [facility] on 12/20/2017 showing: breast density category C. There was a suspicious palpable mass at the 1 o'clock position upper inner quadrant measuring 1.4 x 1.3 x 1.3 cm located 12 cm from the nipple. There was an additional mass at the 8 o'clock radiant measuring 1.2 x 1.0 x 1.4 cm, which likely represented a cyst. Ultrasound evaluation of the right axilla demonstrates no evidence of lymphadenopathy   Accordingly on 12/29/2017 she proceeded to biopsy of the right breast area in question. The pathology from this procedure showed ([id number]): Invasive ductal carcinoma, grade III. Prognostic indicators significant for: estrogen receptor, 80% positive and progesterone receptor, 70% positive, both with strong staining intensity. Proliferation marker Ki67 at 80%. HER2 amplified with ratios HER2/CEP17  signals 3.44 and average HER2 copies per cell 10.65.   The patient's subsequent history is as detailed below.   PAST  MEDICAL HISTORY: Past Medical History:  Diagnosis Date   Arthritis    lower back   Cancer [facility])    right breast cancer   Family history of breast cancer    Personal history of chemotherapy    Personal history of radiation therapy   She notes that she had a heart murmur as a child, which she grew out of.    PAST SURGICAL HISTORY: Past Surgical History:  Procedure Laterality Date   ABDOMINAL HYSTERECTOMY     APPLICATION OF CRANIAL NAVIGATION N/A 12/06/2020   Procedure: APPLICATION OF CRANIAL NAVIGATION;  Surgeon: Judith Part, MD;  Location: [facility];  Service: Neurosurgery;  Laterality: N/A;  RM 20   BREAST LUMPECTOMY Right    BREAST LUMPECTOMY WITH RADIOACTIVE SEED AND SENTINEL LYMPH NODE BIOPSY Right 02/16/2018   Procedure: RIGHT BREAST LUMPECTOMY WITH RADIOACTIVE SEED AND SENTINEL LYMPH NODE BIOPSY;  Surgeon: [name], MD;  Location: [facility];  Service: General;  Laterality: Right;   CRANIOTOMY Right 12/06/2020   Procedure: Right sided awake craniotomy for tumor resection;  Surgeon: Judith Part, MD;  Location: [facility];  Service: Neurosurgery;  Laterality: Right;   HYMENECTOMY     IR IMAGING GUIDED PORT INSERTION  03/12/2021   IR US GUIDE BX ASP/DRAIN  12/02/2020   KYPHOPLASTY N/A 03/07/2021   Procedure: Thoracic nine, Thoracic ten KYPHOPLASTY;  Surgeon: Judith Part, MD;  Location: [facility];  Service: Neurosurgery;  Laterality: N/A;   PORTACATH PLACEMENT Right 02/16/2018   Procedure: INSERTION PORT-A-CATH;  Surgeon: [name], MD;  Location: [facility];  Service: General;  Laterality: Right;   RE-EXCISION OF BREAST LUMPECTOMY Right 02/22/2018   Procedure: RE-EXCISION OF RIGHT  BREAST LUMPECTOMY;  Surgeon: [name], MD;  Location: [facility];  Service: General;  Laterality: Right;   REPAIR VAGINAL CUFF N/A 02/07/2017   Procedure: REPAIR VAGINAL CUFF;  Surgeon: Ena Dawley, MD;  Location: [facility];   Service: Gynecology;  Laterality: N/A;   ROBOTIC ASSISTED TOTAL HYSTERECTOMY WITH SALPINGECTOMY Left 01/20/2017   Procedure: ROBOTIC ASSISTED TOTAL HYSTERECTOMY WITH SALPINGECTOMY;  Surgeon: [name], MD;  Location: [facility];  Service: Gynecology;  Laterality: Left;   TOTAL HIP ARTHROPLASTY Right 2022   total hip and partial femur, per pt  Partial Hysterectomy without BSO   FAMILY HISTORY Family History  Problem Relation Age of Onset   Lung cancer Maternal Grandfather    Esophageal cancer Paternal Grandfather 5   Breast cancer Cousin 30  As of July 2019, the patient's father is alive at 67. The patient's mother is also alive at 61. The patient has 1 brother and 5 sisters. There was a maternal grandfather diagnosed with lung cancer at 48. There was a paternal grandfather diagnosed with esophageal cancer at 58. The patient's father had a pre-cancerous esophageal finding at age 50. There was also a maternal 1st cousin diagnosed with metastatic breast cancer at age 40.    GYNECOLOGIC HISTORY:  Patient's last menstrual period was 12/21/2016 (exact date). Menarche: 50 years old Age at first live birth: 50 years old She is GXP2. She is status post partial hysterectomy without BSO in 2018 She never used HRT. She used oral contraception over 21 years ago with no complications.   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 The patient is separated from her husband, [name]. The patient's son [name] is in the [place] [employer] and is stationed in [place] (for 3 years beginning January 2020). He plans on working in the railroad industry after he returns. The patient's daughter [name], age 34, works at Engineer, maintenance    [place]: Not in place; at the 06/20/2020 visit the patient was given the appropriate documents to complete and notarized at her discretion   HEALTH MAINTENANCE: Social History   Tobacco Use    Smoking status: Never   Smokeless tobacco: Never  Vaping Use   Vaping Use: Former  Substance Use Topics   Alcohol use: Not Currently   Drug use: No     Colonoscopy:   PAP: 2018/ prior to hysterectomy  Bone density:   No Known Allergies  Current Outpatient Medications  Medication Sig Dispense Refill   valACYclovir (VALTREX) 1000 MG tablet Take 1 tablet (1,000 mg total) by mouth 2 (two) times daily. Take one tablet twice a day for one week, then daily 60 tablet 6   acetaminophen (TYLENOL) 500 MG tablet Take 1 tablet (500 mg total) by mouth 3 (three) times daily as needed for mild pain (or Fever >/= 101). Take with ibuprofen 400 mg     b complex vitamins capsule Take 1 capsule by mouth daily. 900 mg     capecitabine (XELODA) 500 MG tablet Take 1,500 mg by mouth 3 (three) times daily.     Cholecalciferol (VITAMIN D) 125 MCG (5000 UT) CAPS Take 5,000 Units by mouth daily.     dexamethasone (DECADRON) 4 MG tablet Take 1 tablet (4 mg total) by mouth daily. 30 tablet 1   DULoxetine (CYMBALTA) 20 MG capsule TAKE 1 CAPSULE BY MOUTH EVERY DAY 90 capsule 2   ferrous sulfate 325 (65 FE) MG tablet Take 325 mg by mouth daily with breakfast.     fluconazole (DIFLUCAN) 100 MG tablet TAKE 1 TABLET BY MOUTH EVERY DAY 30 tablet 1   gabapentin (NEURONTIN) 300 MG capsule Take 1 capsule (300 mg total) by mouth 3 (three) times daily. 90 capsule 6   ibuprofen (ADVIL) 400 MG tablet Take 1 tablet (400 mg total) by mouth 3 (three) times daily. Take with tylenol 500 mg (Patient taking differently: Take 400 mg by mouth 3 (three) times daily as needed for mild pain or moderate pain.) 90 tablet 3   lidocaine (LIDODERM) 5 % Place 1 patch onto the skin daily as needed (for pain- Remove & Discard patch within 12 hours or as directed by MD).     ondansetron (ZOFRAN ODT) 4 MG disintegrating tablet Take 1 tablet (4 mg total) by mouth See admin instructions. Dissolve 4 mg in the mouth three times a day and an additional 4 mg  at bedtime as needed for nausea/vomiting 20 tablet 3   pantoprazole (PROTONIX) 40 MG tablet Take 40 mg by mouth daily. (Patient not taking: Reported on 03/07/2021)     polyethylene glycol (MIRALAX / GLYCOLAX) 17 g packet Take 17 g by mouth daily as needed for moderate constipation. 14 each 0   pyridOXINE (VITAMIN B-6) 100 MG tablet Take 100 mg by mouth daily.     senna-docusate (SENOKOT-S) 8.6-50 MG tablet Take 2 tablets by mouth 2 (two) times daily.     traMADol (ULTRAM) 50 MG tablet Take 1 tablet (50 mg total) by mouth every 6 (six) hours as needed. 120 tablet 0   traZODone (DESYREL) 50 MG tablet Take 1  tablet (50 mg total) by mouth at bedtime as needed for sleep.     TUKYSA 150 MG tablet Take 150 mg by mouth 2 (two) times daily.     No current facility-administered medications for this visit.   Facility-Administered Medications Ordered in Other Visits  Medication Dose Route Frequency Provider Last Rate Last Admin   sodium chloride flush (NS) 0.9 % injection 10 mL  10 mL Intracatheter Once Zacary Bauer, [name], MD        OBJECTIVE: White woman who walked without assistance from chair to examination table  Vitals:   04/15/21 1333  BP: 130/82  Pulse: 83  Temp: 98 F (36.7 C)  SpO2: 99%       Body mass index is 23.66 kg/m.   Wt Readings from Last 3 Encounters:  04/15/21 155 lb 9.6 oz (70.6 kg)  03/25/21 161 lb 6.4 oz (73.2 kg)  03/12/21 162 lb (73.5 kg)  ECOG FS:1  Sclerae unicteric, EOMs intact Wearing a mask No cervical or supraclavicular adenopathy Lungs no rales or rhonchi Heart regular rate and rhythm Abd soft, nontender, positive bowel sounds MSK no focal spinal tenderness, no upper extremity lymphedema Neuro: nonfocal, well oriented, appropriate affect Breasts:    LAB RESULTS:  CMP     Component Value Date/Time   NA 136 04/15/2021 1307   K 3.9 04/15/2021 1307   CL 104 04/15/2021 1307   CO2 21 (L) 04/15/2021 1307   GLUCOSE 140 (H) 04/15/2021 1307   BUN 24 (H)  04/15/2021 1307   CREATININE 0.74 04/15/2021 1307   CREATININE 0.81 06/20/2020 0839   CALCIUM 8.9 04/15/2021 1307   PROT 6.4 (L) 04/15/2021 1307   ALBUMIN 3.5 04/15/2021 1307   AST 61 (H) 04/15/2021 1307   AST 19 06/20/2020 0839   ALT 88 (H) 04/15/2021 1307   ALT 14 06/20/2020 0839   ALKPHOS 140 (H) 04/15/2021 1307   BILITOT 0.7 04/15/2021 1307   BILITOT 0.5 06/20/2020 0839   GFRNONAA >60 04/15/2021 1307   GFRNONAA >60 06/20/2020 0839   GFRAA >60 06/13/2019 1138    Lab Results  Component Value Date   WBC 16.0 (H) 04/15/2021   NEUTROABS 14.8 (H) 04/15/2021   HGB 10.8 (L) 04/15/2021   HCT 32.7 (L) 04/15/2021   MCV 97.9 04/15/2021   PLT 307 04/15/2021    No results found for: LABCA2  No components found for: BTDVVO160  No results for input(s): INR in the last 168 hours.  No results found for: LABCA2  No results found for: CAN199  Lab Results  Component Value Date   CAN125 189.0 (H) 03/25/2021    Lab Results  Component Value Date   CAN153 244.0 (H) 03/25/2021    Lab Results  Component Value Date   CA2729 275.5 (H) 03/25/2021    No components found for: HGQUANT  No results found for: CEA1 / No results found for: CEA1   No results found for: AFPTUMOR  No results found for: CHROMOGRNA  No results found for: TOTALPROTELP, ALBUMINELP, A1GS, A2GS, BETS, BETA2SER, GAMS, MSPIKE, SPEI (this displays SPEP labs)  No results found for: KPAFRELGTCHN, LAMBDASER, KAPLAMBRATIO (kappa/lambda light chains)  No results found for: HGBA, HGBA2QUANT, HGBFQUANT, HGBSQUAN (Hemoglobinopathy evaluation)   No results found for: LDH  Lab Results  Component Value Date   IRON 69 01/16/2021   TIBC 361 01/16/2021   IRONPCTSAT 19 01/16/2021   (Iron and TIBC)  Lab Results  Component Value Date   FERRITIN 2,032 (H) 01/16/2021  Urinalysis    Component Value Date/Time   COLORURINE YELLOW 11/30/2020 1315   APPEARANCEUR HAZY (A) 11/30/2020 1315   LABSPEC 1.009  11/30/2020 1315   PHURINE 5.0 11/30/2020 1315   GLUCOSEU NEGATIVE 11/30/2020 1315   [place] 11/30/2020 [place] 11/30/2020 1315   [place] 11/30/2020 1315   PROTEINUR NEGATIVE 11/30/2020 1315   NITRITE NEGATIVE 11/30/2020 1315   LEUKOCYTESUR TRACE (A) 11/30/2020 1315    STUDIES: CT Chest W Contrast  Result Date: 04/15/2021 CLINICAL DATA:  Breast cancer. Chemotherapy and radiation and 2 weeks ago. EXAM: CT CHEST WITH CONTRAST TECHNIQUE: Multidetector CT imaging of the chest was performed during intravenous contrast administration. CONTRAST:  69m OMNIPAQUE IOHEXOL 350 MG/ML SOLN COMPARISON:  11/30/2020. FINDINGS: Cardiovascular: Right IJ Port-A-Cath terminates in the right atrium. Heart size normal. Small pericardial effusion is new. Mediastinum/Nodes: Low internal jugular lymph nodes are not enlarged by CT size criteria. No pathologically enlarged mediastinal, hilar, internal mammary or axillary lymph nodes. Surgical clips in the right axilla. Esophagus is grossly unremarkable. Lungs/Pleura: Mild biapical pleuroparenchymal scarring. Nodular consolidation in the right middle lobe has progressed but is difficult to measure given adjacent volume loss. Surrounding peribronchovascular nodularity. Additional bilateral pulmonary nodules measure up to 1.5 cm in the right lower lobe (5/101), similar. No pleural fluid. Airway is unremarkable. Upper Abdomen: New and enlarging heterogeneous lesions in the liver. Index lesion in the central aspect of the liver, spanning the left and right hepatic lobes, now measures 5.8 x 6.2 cm (2/128), previously 4.0 x 5.0 cm. Enlarged portal vein. Visualized portions of adrenal glands, kidneys, spleen, pancreas, stomach and bowel are grossly unremarkable. Musculoskeletal: Progressive mottled sclerosis throughout the visualized osseous structures. New pathologic fractures of the left second, sixth and eighth ribs. T9 and T10 vertebral body  augmentations. New compression deformities involving T1, T2, T4, T6 and T7. Progressive compression deformities involving T11 and T12. L1 inferior endplate compression fracture and L2 superior endplate compression fracture, new. Pathologic manubrial fracture. IMPRESSION: 1. Interval progression of hepatic and osseous metastatic disease with multiple new pathologic fractures throughout the axial and appendicular skeleton. 2. Increasing nodular consolidation in the right middle lobe, also worrisome for some for disease progression 3. New small pericardial effusion. Electronically Signed   By: MLorin PicketM.D.   On: 04/15/2021 11:16      ELIGIBLE FOR AVAILABLE RESEARCH PROTOCOL: BCEP  ASSESSMENT: 50y.o.  [place] status post right breast upper inner quadrant biopsy 12/29/2017, for a clinical T1c N0, stage IA invasive ductal carcinoma, triple positive, with an MIB-1 of 80%.  (1) genetics testing 02/02/2018 though the CancerNext gene panel offered by [facility] showed no deleterious mutations in  APC, ATM, BARD1, BMPR1A,BRCA1, BRCA2, BRIP1, CDH1, CDK4, CDKN2A, CHEK2, DICER1, HOXB13, MLH1, MRE11A, MSH2, MSH6, MUTYH, NBN, NF1, PALB2, PMS2, POLD1, POLE, PTEN, RAD50, RAD51C, RAD51D, SMAD4, SMARCA4, STK11 and TP53 (sequencing and deletion/duplication); EPCAM and GREM1 (deletion/duplication only).  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>A) )   (2) right lumpectomy and sentinel lymph node sampling 02/16/2018 showed a pT2 pN0, stage IB invasive ductal carcinoma, grade 3, with a positive inferior margin; a total of 5 lymph nodes were removed  (a) additional surgery 02/27/2018 cleared the margins  (3) started carboplatin, docetaxel, trastuzumab and pertuzumab 03/11/2018, repeated every 21 days x 6, last dose 07/18/2018.    (a) Pertuzumab omitted after cycle 1 due to diarrhea.  (b) Gemcitabine substituted for docetaxel starting with cycle  5 due to peripheral  neuropathy  (4) continued trastuzumab to total 6 months (through February 2020).  (a) echocardiogram 01/21/2018 showed an ejection fraction in the 55-60% range  (b) repeat echocardiogram 06/14/2018 showed an ejection fraction in the 60-65%  (5) adjuvant radiation 08/15/2018 - 09/28/2018  Site/dose:   The patient initially received a dose of 50.4 Gy in 28 fractions to the right breast using whole-breast tangent fields. This was delivered using a 3-D conformal technique. The patient then received a boost to the seroma. This delivered an additional 10 Gy in 5 fractions using 12E, 9E electrons with a special teletherapy technique. The total dose was 60.4 Gy.   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, involving brain, liver, lungs, and bones (7) presenting 11/29/2020 with progressive back pain:             (a) non-contrast CT abd/pelvis (renal stone study) 11/29/2020 shows bone metastases             (b) CT chest/abd/pelvis with contrast 11/30/2020 shows multiple large liver masses, multiple pulmonary nodules, and widespread lytic bone lesions             (c) MRI brain shows at least 4 brain lesions, largest 3.8 cm             (d) total spinal MRI 11/30/2020 shows pathologic fractures at T9, T12, possibly L2, as well as numerous other spine lesions, but no epidural enhancement or cord compression             (e) liver biopsy 12/02/2020 shows adenocarcinoma, prognostic panel again triple positive             (f) pre-op repeat MRI 12/01/2020 shows additional brain lesions, at least 8 of which will be targets for SRS   (8) CNS treatment:             (a) [place] 12/05/2020             (b) surgery 12/06/2020 confirmed metastatic carcinoma, estrogen receptor positive, HER2 amplified, progesterone receptor  negative, with an MIB-1 of 70%.  (c) brain MRI 03/06/2021 shows minimal enhancement along the resection margin, previously noted lesions stable or decreased in size, some no longer seen, and no new brain lesions   (9) XRT to spine 12/05/2020 through 12/23/2020 Site Technique Total Dose (Gy) Dose per Fx (Gy) Completed Fx Beam Energies  Thoracic Spine: Spine_T9-L3 Complex 30/30 3 10/10 15X  Pelvis: Pelvis_sacrum Complex 30/30 3 10/10 15X   (10) advanced directives-- plans to name son as HCPOA with her father as secondary   (73) started capecitabine 01/08/2021, trastuzumab 01/20/2021, tucatinib 02/10/2021  (a) echo 01/14/2021 shows an ejection fraction in the 60-65% range.  (b) capecitabine and trastuzumab discontinued 04/15/2021 with evidence of progression  (c) tucatinib continued  (12) started denosumab/Xgeva 02/10/2021, repeated every 6 weeks  (a) received one dose pamidronate in hospital 01/16/2021  (13) pain management:  (a) Tylenol 500 mg and Advil 400 mg 3 times a day w as needed  (b) gabapentin 3 times a day as needed  (c) tramadol 50 mg 4 times a day as needed   (d) bowel prophylaxis: MiraLAX daily, stool softeners 2 tablets twice daily as needed  (14) status post T9 and T10 kyphoplasty 03/07/2021  (15) Enhertu started 04/22/2021  PLAN: Eliyana is now in her fifth month from diagnosis of metastatic breast cancer.  She has been treated very aggressively and clinically there has been improvement.  Her pain is now well controlled.  Her nausea is also well controlled and she is off the Compazine which was causing her some extraparametal symptoms.  She is able to stand up and walk somewhat wobbly [place] but without assistance.  At home she does use the walker.  Her quality of life however is very poor.  She is currently alone at home.  Her mother and daughter had been with her for some time but they are not available at present.  Her sister cannot stay with her more than a couple of more  days.  Her son has not been able to get home from [place] yet despite our letter and going through channels.  It is not accordingly surprising that Tonika wishes to "stop everything".  The problem is that symptomatically this would not be a good idea.  She is going to start having more problems with pain and nausea and particularly if we go to Enhertu her quality of life really would be no better.  She was able to see this and I think really because she wants to see her son physically present before she dies she agreed to go on with Enhertu.  The plan then is to start Enhertu next week as soon as we can get it approved.  She will continue with tucatinib.  We are stopping the capecitabine.  We are dropping the dexamethasone to 2 mg now I will stop it next week if she maintains her current functional status.  I am also adding Valtrex 1000 mg to take twice daily for a week and then daily indefinitely.  I will see her in the treatment area the same day as she receives her Enhertu  Total encounter time 35 minutes.Sarajane Jews C. Margit Batte, MD  04/15/21 5:52 PM Medical Oncology and Hematology [place] [address], [place] [address] Tel. [phone]    Fax. [phone]   I, Wilburn Mylar, am acting as scribe for Dr. [name]. Rhyder Koegel.  I, Lurline Del MD, have reviewed the above documentation for accuracy and completeness, and I agree with the above.   *Total Encounter Time as defined by the Centers for Medicare and Medicaid Services includes, in addition to the face-to-face time of a patient visit (documented in the note above) non-face-to-face time: obtaining and reviewing outside history, ordering and reviewing medications, tests or procedures, care coordination (communications with other health care professionals or caregivers) and documentation in the medical record.

## 2021-04-15 NOTE — Progress Notes (Signed)
DISCONTINUE OFF PATHWAY REGIMEN - Breast   [id number]:Capecitabine 1,000 mg/m2 PO BID D1-14 + Trastuzumab 8/6 mg/kg IV D1 + Tucatinib 300 mg PO BID D1-21 q21 Days:   Cycle 1: A cycle is 21 days:     Capecitabine      Tucatinib      Trastuzumab-xxxx    Cycles 2 and beyond: A cycle is every 21 days:     Capecitabine      Tucatinib      Trastuzumab-xxxx   **Always confirm dose/schedule in your pharmacy ordering system**  REASON: Disease Progression PRIOR TREATMENT: Off Pathway: Capecitabine 1,000 mg/m2 PO BID D1-14 + Trastuzumab 8/6 mg/kg IV D1 + Tucatinib 300 mg PO BID D1-21 q21 Days TREATMENT RESPONSE: Progressive Disease (PD)  START ON PATHWAY REGIMEN - Breast     A cycle is every 21 days:     Fam-trastuzumab deruxtecan-nxki   **Always confirm dose/schedule in your pharmacy ordering system**  Patient Characteristics: Distant Metastases or Locoregional Recurrent Disease - Unresected or Locally Advanced Unresectable Disease Progressing after Neoadjuvant and Local Therapies, HER2 Positive, ER Positive, Chemotherapy + HER2-Targeted Therapy, Second Line Therapeutic Status: Distant Metastases HER2 Status: Positive (+) ER Status: Positive (+) PR Status: Positive (+) Line of Therapy: Second Line Intent of Therapy: Non-Curative / Palliative Intent, Discussed with Patient 

## 2021-04-16 ENCOUNTER — Other Ambulatory Visit: Payer: Self-pay | Admitting: Oncology

## 2021-04-16 LAB — CA 125: Cancer Antigen (CA) 125: 1054 U/mL — ABNORMAL HIGH (ref 0.0–38.1)

## 2021-04-16 LAB — CANCER ANTIGEN 27.29: CA 27.29: 520 U/mL — ABNORMAL HIGH (ref 0.0–38.6)

## 2021-04-16 LAB — CANCER ANTIGEN 15-3: CA 15-3: 475 U/mL — ABNORMAL HIGH (ref 0.0–25.0)

## 2021-04-17 ENCOUNTER — Telehealth: Payer: Self-pay | Admitting: Oncology

## 2021-04-17 NOTE — Telephone Encounter (Signed)
Scheduled appointment per 10/04 los. Patient is aware.

## 2021-04-21 ENCOUNTER — Ambulatory Visit
Admission: RE | Admit: 2021-04-21 | Discharge: 2021-04-21 | Disposition: A | Discharge status: Home / Self Care | Payer: Medicaid Other | Source: Ambulatory Visit | Attending: Radiation Oncology | Admitting: Radiation Oncology

## 2021-04-21 DIAGNOSIS — C7951 Secondary malignant neoplasm of bone: Secondary | ICD-10-CM

## 2021-04-21 DIAGNOSIS — Z170 Estrogen receptor positive status [ER+]: Secondary | ICD-10-CM

## 2021-04-21 DIAGNOSIS — C7931 Secondary malignant neoplasm of brain: Secondary | ICD-10-CM

## 2021-04-21 DIAGNOSIS — C50211 Malignant neoplasm of upper-inner quadrant of right female breast: Secondary | ICD-10-CM

## 2021-04-21 NOTE — Progress Notes (Signed)
  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  Interval Since Last Radiation:  5 weeks   02/27/2021 through 03/21/2021 Site Technique Total Dose (Gy) Dose per Fx (Gy) Completed Fx Beam Energies  Femur Left: Ext_Lt 3D 37.5/37.5 2.5 15/15 15X  Femur Right: Ext_Rt 3D 37.5/37.5 2.5 15/15 15X    Narrative:  The patient was contacted today for routine follow-up. During treatment she did very well with radiotherapy and did not have significant desquamation. She reports she is really struggling with depression regarding her illness and inability to get out and do much at all. Her daughter makes or delivers meals for her and her son still cannot get home from [place] from his [employer] duties. It is unclear if this process to get him home is progressing.  Impression/Plan: 1. 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 I will reach out to social work as well to see how we can help support the patient, and write additional letters or call members of government to help get her son home to be with her while she considers additional treatment. She is open to social work to help her as well with meals and resources at home. She is aware of outpatient palliative care but is not interested in these options at this time. We discussed that we would be happy to continue to follow her as needed, but she will also continue to follow up with Dr. Mickeal Skinner in brain oncology program and also with Dr. Jana Hakim in medical oncology.           Carola Rhine, PAC

## 2021-04-22 NOTE — Progress Notes (Signed)
Pharmacist Chemotherapy Monitoring - Initial Assessment    Anticipated start date: 04/22/21   The following has been reviewed per standard work regarding the patient's treatment regimen: The patient's diagnosis, treatment plan and drug doses, and organ/hematologic function Lab orders and baseline tests specific to treatment regimen  The treatment plan start date, drug sequencing, and pre-medications Prior authorization status  Patient's documented medication list, including drug-drug interaction screen and prescriptions for anti-emetics and supportive care specific to the treatment regimen The drug concentrations, fluid compatibility, administration routes, and timing of the medications to be used The patient's access for treatment and lifetime cumulative dose history, if applicable  The patient's medication allergies and previous infusion related reactions, if applicable   Changes made to treatment plan:  N/A  Follow up needed:  N/A   Monica [name], [place], 04/22/2021  12:28 PM

## 2021-04-23 ENCOUNTER — Inpatient Hospital Stay (HOSPITAL_BASED_OUTPATIENT_CLINIC_OR_DEPARTMENT_OTHER): Payer: Medicaid Other | Admitting: Oncology

## 2021-04-23 ENCOUNTER — Inpatient Hospital Stay: Payer: Medicaid Other

## 2021-04-23 ENCOUNTER — Other Ambulatory Visit: Payer: Self-pay

## 2021-04-23 VITALS — BP 120/77 | HR 94 | Temp 99.0°F | Resp 20 | Wt 156.5 lb

## 2021-04-23 DIAGNOSIS — C7931 Secondary malignant neoplasm of brain: Secondary | ICD-10-CM

## 2021-04-23 DIAGNOSIS — Z170 Estrogen receptor positive status [ER+]: Secondary | ICD-10-CM

## 2021-04-23 DIAGNOSIS — C7951 Secondary malignant neoplasm of bone: Secondary | ICD-10-CM

## 2021-04-23 DIAGNOSIS — D496 Neoplasm of unspecified behavior of brain: Secondary | ICD-10-CM

## 2021-04-23 DIAGNOSIS — C50211 Malignant neoplasm of upper-inner quadrant of right female breast: Secondary | ICD-10-CM

## 2021-04-23 DIAGNOSIS — C7989 Secondary malignant neoplasm of other specified sites: Secondary | ICD-10-CM

## 2021-04-23 DIAGNOSIS — Z5112 Encounter for antineoplastic immunotherapy: Secondary | ICD-10-CM | POA: Diagnosis not present

## 2021-04-23 DIAGNOSIS — Z95828 Presence of other vascular implants and grafts: Secondary | ICD-10-CM

## 2021-04-23 LAB — CBC WITH DIFFERENTIAL/PLATELET
Abs Immature Granulocytes: 0.09 10*3/uL — ABNORMAL HIGH (ref 0.00–0.07)
Basophils Absolute: 0 10*3/uL (ref 0.0–0.1)
Basophils Relative: 0 %
Eosinophils Absolute: 0.1 10*3/uL (ref 0.0–0.5)
Eosinophils Relative: 1 %
HCT: 34.5 % — ABNORMAL LOW (ref 36.0–46.0)
Hemoglobin: 11.2 g/dL — ABNORMAL LOW (ref 12.0–15.0)
Immature Granulocytes: 1 %
Lymphocytes Relative: 2 %
Lymphs Abs: 0.1 10*3/uL — ABNORMAL LOW (ref 0.7–4.0)
MCH: 32.7 pg (ref 26.0–34.0)
MCHC: 32.5 g/dL (ref 30.0–36.0)
MCV: 100.9 fL — ABNORMAL HIGH (ref 80.0–100.0)
Monocytes Absolute: 0.4 10*3/uL (ref 0.1–1.0)
Monocytes Relative: 4 %
Neutro Abs: 8.3 10*3/uL — ABNORMAL HIGH (ref 1.7–7.7)
Neutrophils Relative %: 92 %
Platelets: 238 10*3/uL (ref 150–400)
RBC: 3.42 MIL/uL — ABNORMAL LOW (ref 3.87–5.11)
RDW: 22.8 % — ABNORMAL HIGH (ref 11.5–15.5)
WBC: 8.9 10*3/uL (ref 4.0–10.5)
nRBC: 0 % (ref 0.0–0.2)

## 2021-04-23 LAB — CMP (CANCER CENTER ONLY)
ALT: 106 U/L — ABNORMAL HIGH (ref 0–44)
AST: 88 U/L — ABNORMAL HIGH (ref 15–41)
Albumin: 3.7 g/dL (ref 3.5–5.0)
Alkaline Phosphatase: 129 U/L — ABNORMAL HIGH (ref 38–126)
Anion gap: 8 (ref 5–15)
BUN: 28 mg/dL — ABNORMAL HIGH (ref 6–20)
CO2: 24 mmol/L (ref 22–32)
Calcium: 9.2 mg/dL (ref 8.9–10.3)
Chloride: 110 mmol/L (ref 98–111)
Creatinine: 0.62 mg/dL (ref 0.44–1.00)
GFR, Estimated: >60 mL/min (ref >60)
Glucose, Bld: 171 mg/dL — ABNORMAL HIGH (ref 70–99)
Potassium: 4.4 mmol/L (ref 3.5–5.1)
Sodium: 142 mmol/L (ref 135–145)
Total Bilirubin: 0.8 mg/dL (ref 0.3–1.2)
Total Protein: 6.8 g/dL (ref 6.5–8.1)

## 2021-04-23 MED ORDER — HEPARIN SOD (PORK) LOCK FLUSH 100 UNIT/ML IV SOLN
500.0000 [IU] | Freq: Once | INTRAVENOUS | Status: AC | PRN
  Administered 2021-04-23: 500 [IU]

## 2021-04-23 MED ORDER — FAM-TRASTUZUMAB DERUXTECAN-NXKI CHEMO 100 MG IV SOLR
400.0000 mg | Freq: Once | INTRAVENOUS | Status: AC
  Administered 2021-04-23: 400 mg via INTRAVENOUS
  Filled 2021-04-23: qty 20

## 2021-04-23 MED ORDER — DEXTROSE 5 % IV SOLN: Freq: Once | INTRAVENOUS | Status: AC

## 2021-04-23 MED ORDER — SODIUM CHLORIDE 0.9% FLUSH
10.0000 mL | INTRAVENOUS | Status: AC | PRN
  Administered 2021-04-23: 10 mL

## 2021-04-23 MED ORDER — SODIUM CHLORIDE 0.9% FLUSH
10.0000 mL | INTRAVENOUS | Status: DC | PRN
  Administered 2021-04-23: 10 mL

## 2021-04-23 MED ORDER — DIPHENHYDRAMINE HCL 25 MG PO CAPS
50.0000 mg | ORAL_CAPSULE | Freq: Once | ORAL | Status: AC
  Administered 2021-04-23: 50 mg via ORAL
  Filled 2021-04-23: qty 2

## 2021-04-23 MED ORDER — SODIUM CHLORIDE 0.9 % IV SOLN
10.0000 mg | Freq: Once | INTRAVENOUS | Status: AC
  Administered 2021-04-23: 10 mg via INTRAVENOUS
  Filled 2021-04-23: qty 10

## 2021-04-23 MED ORDER — ACETAMINOPHEN 325 MG PO TABS
650.0000 mg | ORAL_TABLET | Freq: Once | ORAL | Status: AC
  Administered 2021-04-23: 650 mg via ORAL
  Filled 2021-04-23: qty 2

## 2021-04-23 MED ORDER — PALONOSETRON HCL INJECTION 0.25 MG/5ML
0.2500 mg | Freq: Once | INTRAVENOUS | Status: AC
  Administered 2021-04-23: 0.25 mg via INTRAVENOUS
  Filled 2021-04-23: qty 5

## 2021-04-23 NOTE — Progress Notes (Signed)
[place]  [phone] [phone]    ID: Monica [name] DOB: 1970-11-10  MR#: [id number]  RAQ#:[id number]  Patient Care Team: Beverley Fiedler, [place] as PCP - General (Endocrinology) [name], MD as Consulting Physician (General Surgery) , [name], MD as Consulting Physician (Oncology) [name], MD as Consulting Physician (Radiation Oncology) [name], MD as Consulting Physician (Obstetrics and Gynecology) Larey Dresser, MD as Consulting Physician (Cardiology) Janice Coffin, MD as Referring Physician (Orthopedic Surgery) Judith Part, MD as Consulting Physician (Neurosurgery) Raina Mina, RPH-CPP (Pharmacist) Ventura Sellers, MD as Consulting Physician (Psychiatry) Judith Part, MD as Consulting Physician (Neurosurgery) OTHER MD: [facility]  CHIEF COMPLAINT: triple positive breast cancer  CURRENT TREATMENT: tucatinib, Enhertu; denosumab Xgeva   INTERVAL HISTORY: Monica [name] returns today for follow up and treatment of her triple positive breast cancer.    She continues on tucatinib, started 02/12/2021.  She is tolerating this with no side effects that she is aware to.  Most recent scans showed evidence of progression so she is being switched to Enhertu, with her first dose today.  She has a good understanding of the possible toxicities side effects and complications of this agent.  Her most recent echo was 705 2022 with an ejection fraction of 60-65%.   REVIEW OF SYSTEMS: Monica [name] tells me she is "fine" right now so long she takes her medications regularly.  She is using Tylenol and 2 Advil 3 times a day for pain and this is producing very good control.  She no longer has nausea so she is not taking Compazine or any other antiemetic.  She has fleeting headaches, which are not really pain she says and are again very very brief.  Unfortunately her vision is still poor and she has difficulty  concentrating.  She cannot read very well.  She does not find anything on TV to watch, which I can understand and she does not have access to audio books--she knows the excess but does not know how she might get them.  Bowel movements are fine.  Urine is a bit concentrated but otherwise unremarkable.  The biggest problem is that she is alone all day with nothing to do and this is really driving her insane she says   COVID 19 VACCINATION STATUS: Status post [place] x2, no booster as of December 2021   HISTORY OF CURRENT ILLNESS: From the original intake note:   Monica [name] (pronounced "uric") palpated a right supraclavicular mass and felt tenderness after stretching one day. She brought this to her PA's attention.  The patient underwent bilateral diagnostic mammography with tomography and right breast ultrasonography at [facility] on 12/20/2017 showing: breast density category C. There was a suspicious palpable mass at the 1 o'clock position upper inner quadrant measuring 1.4 x 1.3 x 1.3 cm located 12 cm from the nipple. There was an additional mass at the 8 o'clock radiant measuring 1.2 x 1.0 x 1.4 cm, which likely represented a cyst. Ultrasound evaluation of the right axilla demonstrates no evidence of lymphadenopathy   Accordingly on 12/29/2017 she proceeded to biopsy of the right breast area in question. The pathology from this procedure showed ([id number]): Invasive ductal carcinoma, grade III. Prognostic indicators significant for: estrogen receptor, 80% positive and progesterone receptor, 70% positive, both with strong staining intensity. Proliferation marker Ki67 at 80%. HER2 amplified with ratios HER2/CEP17 signals 3.44 and average HER2 copies per cell 10.65.   The patient's subsequent history is as  detailed below.   PAST MEDICAL HISTORY: Past Medical History:  Diagnosis Date   Arthritis    lower back   Cancer [facility])    right breast cancer   Family history of breast cancer     Personal history of chemotherapy    Personal history of radiation therapy   She notes that she had a heart murmur as a child, which she grew out of.    PAST SURGICAL HISTORY: Past Surgical History:  Procedure Laterality Date   ABDOMINAL HYSTERECTOMY     APPLICATION OF CRANIAL NAVIGATION N/A 12/06/2020   Procedure: APPLICATION OF CRANIAL NAVIGATION;  Surgeon: Judith Part, MD;  Location: [place];  Service: Neurosurgery;  Laterality: N/A;  RM 20   BREAST LUMPECTOMY Right    BREAST LUMPECTOMY WITH RADIOACTIVE SEED AND SENTINEL LYMPH NODE BIOPSY Right 02/16/2018   Procedure: RIGHT BREAST LUMPECTOMY WITH RADIOACTIVE SEED AND SENTINEL LYMPH NODE BIOPSY;  Surgeon: [name], MD;  Location: [place];  Service: General;  Laterality: Right;   CRANIOTOMY Right 12/06/2020   Procedure: Right sided awake craniotomy for tumor resection;  Surgeon: Judith Part, MD;  Location: [place];  Service: Neurosurgery;  Laterality: Right;   HYMENECTOMY     IR IMAGING GUIDED PORT INSERTION  03/12/2021   IR US GUIDE BX ASP/DRAIN  12/02/2020   KYPHOPLASTY N/A 03/07/2021   Procedure: Thoracic nine, Thoracic ten KYPHOPLASTY;  Surgeon: Judith Part, MD;  Location: [place];  Service: Neurosurgery;  Laterality: N/A;   PORTACATH PLACEMENT Right 02/16/2018   Procedure: INSERTION PORT-A-CATH;  Surgeon: [name], MD;  Location: [place];  Service: General;  Laterality: Right;   RE-EXCISION OF BREAST LUMPECTOMY Right 02/22/2018   Procedure: RE-EXCISION OF RIGHT  BREAST LUMPECTOMY;  Surgeon: [name], MD;  Location: [place];  Service: General;  Laterality: Right;   REPAIR VAGINAL CUFF N/A 02/07/2017   Procedure: REPAIR VAGINAL CUFF;  Surgeon: Ena Dawley, MD;  Location: [facility];  Service: Gynecology;  Laterality: N/A;   ROBOTIC ASSISTED TOTAL HYSTERECTOMY WITH SALPINGECTOMY Left 01/20/2017   Procedure: ROBOTIC ASSISTED TOTAL HYSTERECTOMY  WITH SALPINGECTOMY;  Surgeon: [name], MD;  Location: [facility];  Service: Gynecology;  Laterality: Left;   TOTAL HIP ARTHROPLASTY Right 2022   total hip and partial femur, per pt  Partial Hysterectomy without BSO   FAMILY HISTORY Family History  Problem Relation Age of Onset   Lung cancer Maternal Grandfather    Esophageal cancer Paternal Grandfather 47   Breast cancer Cousin 43  As of July 2019, the patient's father is alive at 86. The patient's mother is also alive at 42. The patient has 1 brother and 5 sisters. There was a maternal grandfather diagnosed with lung cancer at 40. There was a paternal grandfather diagnosed with esophageal cancer at 30. The patient's father had a pre-cancerous esophageal finding at age 71. There was also a maternal 1st cousin diagnosed with metastatic breast cancer at age 81.    GYNECOLOGIC HISTORY:  Patient's last menstrual period was 12/21/2016 (exact date). Menarche: 50 years old Age at first live birth: 50 years old She is GXP2. She is status post partial hysterectomy without BSO in 2018 She never used HRT. She used oral contraception over 21 years ago with no complications.   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 The patient is separated from her husband, [name]. The patient's son [name] is in  the [employer] and is stationed in [place] (for 3 years beginning January 2020). He plans on working in the railroad industry after he returns. The patient's daughter [name], age 53, works at [employer], [employer]    Monica [name]: Not in place; at the 06/20/2020 visit the patient was given the appropriate documents to complete and notarized at her discretion   HEALTH MAINTENANCE: Social History   Tobacco Use   Smoking status: Never   Smokeless tobacco: Never  Vaping Use   Vaping Use: Former  Substance Use Topics   Alcohol use: Not Currently   Drug use: No      Colonoscopy:   PAP: 2018/ prior to hysterectomy  Bone density:   No Known Allergies  Current Outpatient Medications  Medication Sig Dispense Refill   acetaminophen (TYLENOL) 500 MG tablet Take 1 tablet (500 mg total) by mouth 3 (three) times daily as needed for mild pain (or Fever >/= 101). Take with ibuprofen 400 mg     b complex vitamins capsule Take 1 capsule by mouth daily. 900 mg     Cholecalciferol (VITAMIN D) 125 MCG (5000 UT) CAPS Take 5,000 Units by mouth daily.     dexamethasone (DECADRON) 4 MG tablet Take 2 tablets (8 mg total) by mouth daily. Start the day after chemotherapy for 2 days. 30 tablet 1   DULoxetine (CYMBALTA) 20 MG capsule TAKE 1 CAPSULE BY MOUTH EVERY DAY 90 capsule 2   ferrous sulfate 325 (65 FE) MG tablet Take 325 mg by mouth daily with breakfast.     fluconazole (DIFLUCAN) 100 MG tablet TAKE 1 TABLET BY MOUTH EVERY DAY 30 tablet 1   gabapentin (NEURONTIN) 300 MG capsule Take 1 capsule (300 mg total) by mouth 3 (three) times daily. 90 capsule 6   ibuprofen (ADVIL) 400 MG tablet Take 1 tablet (400 mg total) by mouth 3 (three) times daily. Take with tylenol 500 mg (Patient taking differently: Take 400 mg by mouth 3 (three) times daily as needed for mild pain or moderate pain.) 90 tablet 3   lidocaine (LIDODERM) 5 % Place 1 patch onto the skin daily as needed (for pain- Remove & Discard patch within 12 hours or as directed by MD). (Patient not taking: Reported on 04/23/2021)     ondansetron (ZOFRAN ODT) 4 MG disintegrating tablet Take 1 tablet (4 mg total) by mouth See admin instructions. Dissolve 4 mg in the mouth three times a day and an additional 4 mg at bedtime as needed for nausea/vomiting 20 tablet 3   pantoprazole (PROTONIX) 40 MG tablet Take 40 mg by mouth daily.     polyethylene glycol (MIRALAX / GLYCOLAX) 17 g packet Take 17 g by mouth daily as needed for moderate constipation. 14 each 0   pyridOXINE (VITAMIN B-6) 100 MG tablet Take 100 mg by mouth  daily.     senna-docusate (SENOKOT-S) 8.6-50 MG tablet Take 2 tablets by mouth 2 (two) times daily. (Patient not taking: Reported on 04/23/2021)     traMADol (ULTRAM) 50 MG tablet Take 1 tablet (50 mg total) by mouth every 6 (six) hours as needed. (Patient not taking: Reported on 04/23/2021) 120 tablet 0   traZODone (DESYREL) 50 MG tablet Take 1 tablet (50 mg total) by mouth at bedtime as needed for sleep. (Patient not taking: Reported on 04/23/2021)     TUKYSA 150 MG tablet Take 150 mg by mouth 2 (two) times daily.     valACYclovir (VALTREX) 1000 MG tablet Take 1 tablet (1,000  mg total) by mouth 2 (two) times daily. Take one tablet twice a day for one week, then daily 60 tablet 6   No current facility-administered medications for this visit.   Facility-Administered Medications Ordered in Other Visits  Medication Dose Route Frequency Provider Last Rate Last Admin   sodium chloride flush (NS) 0.9 % injection 10 mL  10 mL Intracatheter Once , [name], MD        OBJECTIVE: White woman examined in the treatment area There were no vitals filed for this visit. For labs associated with the 04/23/2021 visit please see the treatment area flowsheet     There is no height or weight on file to calculate BMI.   Wt Readings from Last 3 Encounters:  04/23/21 156 lb 8 oz (71 kg)  04/15/21 155 lb 9.6 oz (70.6 kg)  03/25/21 161 lb 6.4 oz (73.2 kg)  ECOG FS:1  Evaluated in the treatment area     LAB RESULTS:  CMP     Component Value Date/Time   NA 136 04/15/2021 1307   K 3.9 04/15/2021 1307   CL 104 04/15/2021 1307   CO2 21 (L) 04/15/2021 1307   GLUCOSE 140 (H) 04/15/2021 1307   BUN 24 (H) 04/15/2021 1307   CREATININE 0.74 04/15/2021 1307   CREATININE 0.81 06/20/2020 0839   CALCIUM 8.9 04/15/2021 1307   PROT 6.4 (L) 04/15/2021 1307   ALBUMIN 3.5 04/15/2021 1307   AST 61 (H) 04/15/2021 1307   AST 19 06/20/2020 0839   ALT 88 (H) 04/15/2021 1307   ALT 14 06/20/2020 0839   ALKPHOS  140 (H) 04/15/2021 1307   BILITOT 0.7 04/15/2021 1307   BILITOT 0.5 06/20/2020 0839   GFRNONAA >60 04/15/2021 1307   GFRNONAA >60 06/20/2020 0839   GFRAA >60 06/13/2019 1138    Lab Results  Component Value Date   WBC 8.9 04/23/2021   NEUTROABS 8.3 (H) 04/23/2021   HGB 11.2 (L) 04/23/2021   HCT 34.5 (L) 04/23/2021   MCV 100.9 (H) 04/23/2021   PLT 238 04/23/2021    No results found for: LABCA2  No components found for: [id number]  No results for input(s): INR in the last 168 hours.  No results found for: LABCA2  No results found for: CAN199  Lab Results  Component Value Date   CAN125 1,054.0 (H) 04/15/2021    Lab Results  Component Value Date   CAN153 475.0 (H) 04/15/2021    Lab Results  Component Value Date   CA2729 520.0 (H) 04/15/2021    No components found for: HGQUANT  No results found for: CEA1 / No results found for: CEA1   No results found for: AFPTUMOR  No results found for: CHROMOGRNA  No results found for: TOTALPROTELP, ALBUMINELP, A1GS, A2GS, BETS, BETA2SER, GAMS, MSPIKE, SPEI (this displays SPEP labs)  No results found for: KPAFRELGTCHN, LAMBDASER, KAPLAMBRATIO (kappa/lambda light chains)  No results found for: HGBA, HGBA2QUANT, HGBFQUANT, HGBSQUAN (Hemoglobinopathy evaluation)   No results found for: LDH  Lab Results  Component Value Date   IRON 69 01/16/2021   TIBC 361 01/16/2021   IRONPCTSAT 19 01/16/2021   (Iron and TIBC)  Lab Results  Component Value Date   FERRITIN 2,032 (H) 01/16/2021    Urinalysis    Component Value Date/Time   COLORURINE YELLOW 11/30/2020 1315   APPEARANCEUR HAZY (A) 11/30/2020 1315   LABSPEC 1.009 11/30/2020 1315   PHURINE 5.0 11/30/2020 [place] 11/30/2020 [place] 11/30/2020 1315  BILIRUBINUR NEGATIVE 11/30/2020 [place] 11/30/2020 [place] 11/30/2020 1315   NITRITE NEGATIVE 11/30/2020 1315   LEUKOCYTESUR TRACE (A) 11/30/2020  1315    STUDIES: CT Chest W Contrast  Result Date: 04/15/2021 CLINICAL DATA:  Breast cancer. Chemotherapy and radiation and 2 weeks ago. EXAM: CT CHEST WITH CONTRAST TECHNIQUE: Multidetector CT imaging of the chest was performed during intravenous contrast administration. CONTRAST:  50m OMNIPAQUE IOHEXOL 350 MG/ML SOLN COMPARISON:  11/30/2020. FINDINGS: Cardiovascular: Right IJ Port-A-Cath terminates in the right atrium. Heart size normal. Small pericardial effusion is new. Mediastinum/Nodes: Low internal jugular lymph nodes are not enlarged by CT size criteria. No pathologically enlarged mediastinal, hilar, internal mammary or axillary lymph nodes. Surgical clips in the right axilla. Esophagus is grossly unremarkable. Lungs/Pleura: Mild biapical pleuroparenchymal scarring. Nodular consolidation in the right middle lobe has progressed but is difficult to measure given adjacent volume loss. Surrounding peribronchovascular nodularity. Additional bilateral pulmonary nodules measure up to 1.5 cm in the right lower lobe (5/101), similar. No pleural fluid. Airway is unremarkable. Upper Abdomen: New and enlarging heterogeneous lesions in the liver. Index lesion in the central aspect of the liver, spanning the left and right hepatic lobes, now measures 5.8 x 6.2 cm (2/128), previously 4.0 x 5.0 cm. Enlarged portal vein. Visualized portions of adrenal glands, kidneys, spleen, pancreas, stomach and bowel are grossly unremarkable. Musculoskeletal: Progressive mottled sclerosis throughout the visualized osseous structures. New pathologic fractures of the left second, sixth and eighth ribs. T9 and T10 vertebral body augmentations. New compression deformities involving T1, T2, T4, T6 and T7. Progressive compression deformities involving T11 and T12. L1 inferior endplate compression fracture and L2 superior endplate compression fracture, new. Pathologic manubrial fracture. IMPRESSION: 1. Interval progression of hepatic and  osseous metastatic disease with multiple new pathologic fractures throughout the axial and appendicular skeleton. 2. Increasing nodular consolidation in the right middle lobe, also worrisome for some for disease progression 3. New small pericardial effusion. Electronically Signed   By: MLorin PicketM.D.   On: 04/15/2021 11:16      ELIGIBLE FOR AVAILABLE RESEARCH PROTOCOL: BCEP  ASSESSMENT: 50y.o.  Monica [name], Monica [name] status post right breast upper inner quadrant biopsy 12/29/2017, for a clinical T1c N0, stage IA invasive ductal carcinoma, triple positive, with an MIB-1 of 80%.  (1) genetics testing 02/02/2018 though the CancerNext gene panel offered by [facility] showed no deleterious mutations in  APC, ATM, BARD1, BMPR1A,BRCA1, BRCA2, BRIP1, CDH1, CDK4, CDKN2A, CHEK2, DICER1, HOXB13, MLH1, MRE11A, MSH2, MSH6, MUTYH, NBN, NF1, PALB2, PMS2, POLD1, POLE, PTEN, RAD50, RAD51C, RAD51D, SMAD4, SMARCA4, STK11 and TP53 (sequencing and deletion/duplication); EPCAM and GREM1 (deletion/duplication only).  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>A) )   (2) right lumpectomy and sentinel lymph node sampling 02/16/2018 showed a pT2 pN0, stage IB invasive ductal carcinoma, grade 3, with a positive inferior margin; a total of 5 lymph nodes were removed  (a) additional surgery 02/27/2018 cleared the margins  (3) started carboplatin, docetaxel, trastuzumab and pertuzumab 03/11/2018, repeated every 21 days x 6, last dose 07/18/2018.    (a) Pertuzumab omitted after cycle 1 due to diarrhea.  (b) Gemcitabine substituted for docetaxel starting with cycle 5 due to peripheral neuropathy  (4) continued trastuzumab to total 6 months (through February 2020).  (a) echocardiogram 01/21/2018 showed an ejection fraction in the 55-60% range  (b) repeat echocardiogram 06/14/2018 showed an ejection fraction in the 60-65%  (5) adjuvant radiation 08/15/2018 -  09/28/2018  Site/dose:   The  patient initially received a dose of 50.4 Gy in 28 fractions to the right breast using whole-breast tangent fields. This was delivered using a 3-D conformal technique. The patient then received a boost to the seroma. This delivered an additional 10 Gy in 5 fractions using 12E, 9E electrons with a special teletherapy technique. The total dose was 60.4 Gy.   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, involving brain, liver, lungs, and bones (7) presenting 11/29/2020 with progressive back pain:             (a) non-contrast CT abd/pelvis (renal stone study) 11/29/2020 shows bone metastases             (b) CT chest/abd/pelvis with contrast 11/30/2020 shows multiple large liver masses, multiple pulmonary nodules, and widespread lytic bone lesions             (c) MRI brain shows at least 4 brain lesions, largest 3.8 cm             (d) total spinal MRI 11/30/2020 shows pathologic fractures at T9, T12, possibly L2, as well as numerous other spine lesions, but no epidural enhancement or cord compression             (e) liver biopsy 12/02/2020 shows adenocarcinoma, prognostic panel again triple positive             (f) pre-op repeat MRI 12/01/2020 shows additional brain lesions, at least 8 of which will be targets for SRS   (8) CNS treatment:             (a) [place] 12/05/2020             (b) surgery 12/06/2020 confirmed metastatic carcinoma, estrogen receptor positive, HER2 amplified, progesterone receptor negative, with an MIB-1 of 70%.  (c) brain MRI 03/06/2021 shows minimal enhancement along the resection margin, previously noted lesions stable or decreased in size, some no longer seen, and no new brain lesions   (9) XRT to spine 12/05/2020 through 12/23/2020 Site Technique Total Dose (Gy) Dose per Fx (Gy)  Completed Fx Beam Energies  Thoracic Spine: Spine_T9-L3 Complex 30/30 3 10/10 15X  Pelvis: Pelvis_sacrum Complex 30/30 3 10/10 15X   (10) advanced directives-- plans to name son as HCPOA with her father as secondary   (43) started capecitabine 01/08/2021, trastuzumab 01/20/2021, tucatinib 02/10/2021  (a) echo 01/14/2021 shows an ejection fraction in the 60-65% range.  (b) capecitabine and trastuzumab discontinued 04/15/2021 with evidence of progression  (c) tucatinib continued  (12) started denosumab/Xgeva 02/10/2021, repeated every 6 weeks  (a) received one dose pamidronate in hospital 01/16/2021  (13) pain management:  (a) Tylenol 500 mg and Advil 400 mg 3 times a day w as needed  (b) gabapentin 3 times a day as needed  (c) tramadol 50 mg 4 times a day as needed   (d) bowel prophylaxis: MiraLAX daily, stool softeners 2 tablets twice daily as needed  (14) status post T9 and T10 kyphoplasty 03/07/2021  (15) Enhertu started 04/23/2021   PLAN: Joycelynn is in her fifth month from definitive diagnosis of metastatic breast cancer.  It has been a rough ride but clinically there is clear improvement.  Currently her pain is very well controlled without narcotics, she has no nausea or vomiting, no headaches, and  is ambulating with a walker but without falling.  On the negative side we did see disease progression on the most recent scan so she is starting Enhertu today.  I am going to see her in a virtual visit in a week just to make sure that she is tolerating this well.  She understands that she will take dexamethasone 8 mg tomorrow and the next day with breakfast.  After that she will go back to 2 mg with breakfast which is where she was before, and then move to every other day early next week  I do not have a solution for her being stuck at home by herself.  She does have her daughter who walks with her once a day and she does have a significant other that visits.  The person she is really  waiting on is her son [name] who is stuck in [place] and has not been released by the [employer] for reasons that I cannot understand.  I do think she would benefit from audiobooks in perhaps she can work with her daughter to get those from the [employer]  She will see me in person again in 3 weeks with her second Enhertu dose.  Total encounter time 25 minutes.Sarajane Jews C. Loveda Colaizzi, MD  04/23/21 2:33 PM Medical Oncology [facility] Hematology [place] [address], [place] [address] Tel. [phone]    Fax. [phone]   I, Wilburn Mylar, am acting as scribe for Dr. [name]. Gradyn Shein.  I, Lurline Del MD, have reviewed the above documentation for accuracy and completeness, and I agree with the above.   *Total Encounter Time as defined by the Centers for Medicare and Medicaid Services includes, in addition to the face-to-face time of a patient visit (documented in the note above) non-face-to-face time: obtaining and reviewing outside history, ordering and reviewing medications, tests or procedures, care coordination (communications with other health care professionals or caregivers) and documentation in the medical record.

## 2021-04-23 NOTE — Patient Instructions (Addendum)
[facility]  Discharge Instructions: Thank you for choosing [facility] to provide your oncology and hematology care.   If you have a lab appointment with the [facility], please go directly to the [facility] and check in at the registration area.   Wear comfortable clothing and clothing appropriate for easy access to any Portacath or PICC line.   We strive to give you quality time with your provider. You may need to reschedule your appointment if you arrive late (15 or more minutes).  Arriving late affects you and other patients whose appointments are after yours.  Also, if you miss three or more appointments without notifying the office, you may be dismissed from the clinic at the provider's discretion.      For prescription refill requests, have your pharmacy contact our office and allow 72 hours for refills to be completed.    Today you received the following chemotherapy and/or immunotherapy agents Enhertu      To help prevent nausea and vomiting after your treatment, we encourage you to take your nausea medication as directed.  BELOW ARE SYMPTOMS THAT SHOULD BE REPORTED IMMEDIATELY: *FEVER GREATER THAN 100.4 F (38 C) OR HIGHER *CHILLS OR SWEATING *NAUSEA AND VOMITING THAT IS NOT CONTROLLED WITH YOUR NAUSEA MEDICATION *UNUSUAL SHORTNESS OF BREATH *UNUSUAL BRUISING OR BLEEDING *URINARY PROBLEMS (pain or burning when urinating, or frequent urination) *BOWEL PROBLEMS (unusual diarrhea, constipation, pain near the anus) TENDERNESS IN MOUTH AND THROAT WITH OR WITHOUT PRESENCE OF ULCERS (sore throat, sores in mouth, or a toothache) UNUSUAL RASH, SWELLING OR PAIN  UNUSUAL VAGINAL DISCHARGE OR ITCHING   Items with * indicate a potential emergency and should be followed up as soon as possible or go to the Emergency Department if any problems should occur.  Please show the CHEMOTHERAPY ALERT CARD or IMMUNOTHERAPY ALERT CARD at check-in to the  Emergency Department and triage nurse.  Should you have questions after your visit or need to cancel or reschedule your appointment, please contact [facility]  Dept: [phone]  and follow the prompts.  Office hours are 8:00 a.m. to 4:30 p.m. Monday - Friday. Please note that voicemails left after 4:00 p.m. may not be returned until the following business day.  We are closed weekends and major holidays. You have access to a nurse at all times for urgent questions. Please call the main number to the clinic Dept: [phone] and follow the prompts.   For any non-urgent questions, you may also contact your provider using MyChart. We now offer e-Visits for anyone 74 and older to request care online for non-urgent symptoms. For details visit [email or web address].   Also download the MyChart app! Go to the app store, search "MyChart", open the app, select [facility], and log in with your MyChart username and password.  Due to Covid, a mask is required upon entering the hospital/clinic. If you do not have a mask, one will be given to you upon arrival. For doctor visits, patients may have 1 support person aged 87 or older with them. For treatment visits, patients cannot have anyone with them due to current Covid guidelines and our immunocompromised population.   Fam-Trastuzumab deruxtecan injection What is this medication? TRASTUZUMAB DERUXTECAN (tras TOOZ eu mab DER ux TEE kan) is a chemotherapy medicine and a monoclonal antibody. It treats certain types of cancer. Some of the cancers treated are breast cancer and gastric cancer. This medicine may be used for other  purposes; ask your health care provider or pharmacist if you have questions. COMMON BRAND NAME(S): ENHERTU What should I tell my care team before I take this medication? They need to know if you have any of these conditions: heart disease heart failure infection (especially a virus infection such as  chickenpox, cold sores, or herpes) liver disease lung or breathing disease, like asthma an unusual or allergic reaction to fam-trastuzumab deruxtecan, other medications, foods, dyes, or preservatives pregnant or trying to get pregnant breast-feeding How should I use this medication? This medicine is for infusion into a vein. It is given by a health care professional in a hospital or clinic setting. Talk to your pediatrician regarding the use of this medicine in children. Special care may be needed. Overdosage: If you think you have taken too much of this medicine contact a poison control center or emergency room at once. NOTE: This medicine is only for you. Do not share this medicine with others. What if I miss a dose? It is important not to miss your dose. Call your doctor or health care professional if you are unable to keep an appointment. What may interact with this medication? Interaction studies have not been performed. This list may not describe all possible interactions. Give your health care provider a list of all the medicines, herbs, non-prescription drugs, or dietary supplements you use. Also tell them if you smoke, drink alcohol, or use illegal drugs. Some items may interact with your medicine. What should I watch for while using this medication? Visit your healthcare professional for regular checks on your progress. Tell your healthcare professional if your symptoms do not start to get better or if they get worse. Your condition will be monitored carefully while you are receiving this medicine. Do not become pregnant while taking this medicine or for 7 months after stopping it. Women should inform their healthcare professional if they wish to become pregnant or think they might be pregnant. Men should not father a child while taking this medicine and for 4 months after stopping it. There is potential for serious side effects to an unborn child. Talk to your healthcare professional  for more information. Do not breast-feed an infant while taking this medicine or for 7 months after the last dose. This medicine has caused decreased sperm counts in some men. This may make it more difficult to father a child. Talk to your healthcare professional if you are concerned about your fertility. This medicine may increase your risk to bruise or bleed. Call your health care professional if you notice any unusual bleeding. Be careful brushing or flossing your teeth or using a toothpick because you may get an infection or bleed more easily. If you have any dental work done, tell your dentist you are receiving this medicine. This medicine may cause dry eyes [and blurred vision]. If you wear contact lenses, you may feel some discomfort. Lubricating eye drops may help. See your healthcare professional if the problem does not go away or is severe. Call your healthcare professional for advice if you get a fever, chills, or sore throat, or other symptoms of a cold or flu. Do not treat yourself. This medicine decreases your body's ability to fight infections. Try to avoid being around people who are sick. Avoid taking medicines that contain aspirin, acetaminophen, ibuprofen, naproxen, or ketoprofen unless instructed by your healthcare professional. These medicines may hide a fever. What side effects may I notice from receiving this medication? Side effects that you should report  to your doctor or health care professional as soon as possible: allergic reactions like skin rash, itching or hives, swelling of the face, lips, or tongue breathing problems cough nausea, vomiting signs and symptoms of bleeding such as bloody or black, tarry stools; red or dark-brown urine; spitting up blood or brown material that looks like coffee grounds; red spots on the skin; unusual bruising or bleeding from the eye, gums, or nose signs and symptoms of heart failure like breathing problems, fast, irregular heartbeat,  sudden weight gain; swelling of the ankles, feet, hands; unusually weak or tired signs and symptoms of infection like fever; chills; cough; sore throat; pain or trouble passing urine signs and symptoms of low red blood cells or anemia such as unusually weak or tired; feeling faint or lightheaded; falls; breathing problems Side effects that usually do not require medical attention (report these to your doctor or health care professional if they continue or are bothersome): constipation diarrhea dry eyes hair loss loss of appetite mouth sores rash This list may not describe all possible side effects. Call your doctor for medical advice about side effects. You may report side effects to FDA at 1-800-FDA-1088. Where should I keep my medication? This drug is given in a hospital or clinic and will not be stored at home. NOTE: This sheet is a summary. It may not cover all possible information. If you have questions about this medicine, talk to your doctor, pharmacist, or health care provider.  2022 [employer] (2019-11-29 16:25:39)

## 2021-04-23 NOTE — Progress Notes (Signed)
Last echo 01/14/21.  Ok to proceed with Enhertu per MD.    Liver enzymes elevated.  MD aware.  Dr. Jana Hakim ok to proceed with chemotherapy.

## 2021-04-24 ENCOUNTER — Ambulatory Visit: Payer: 59 | Admitting: Oncology

## 2021-04-24 ENCOUNTER — Other Ambulatory Visit: Payer: Self-pay | Admitting: Radiation Therapy

## 2021-04-24 LAB — CA 125: Cancer Antigen (CA) 125: 1296 U/mL — ABNORMAL HIGH (ref 0.0–38.1)

## 2021-04-24 LAB — CANCER ANTIGEN 15-3: CA 15-3: 599 U/mL — ABNORMAL HIGH (ref 0.0–25.0)

## 2021-04-24 LAB — CANCER ANTIGEN 27.29: CA 27.29: 669.2 U/mL — ABNORMAL HIGH (ref 0.0–38.6)

## 2021-04-30 ENCOUNTER — Inpatient Hospital Stay (HOSPITAL_BASED_OUTPATIENT_CLINIC_OR_DEPARTMENT_OTHER): Payer: Medicaid Other | Admitting: Oncology

## 2021-04-30 DIAGNOSIS — C50211 Malignant neoplasm of upper-inner quadrant of right female breast: Secondary | ICD-10-CM | POA: Diagnosis not present

## 2021-04-30 DIAGNOSIS — C7989 Secondary malignant neoplasm of other specified sites: Secondary | ICD-10-CM

## 2021-04-30 DIAGNOSIS — C7951 Secondary malignant neoplasm of bone: Secondary | ICD-10-CM | POA: Diagnosis not present

## 2021-04-30 DIAGNOSIS — C787 Secondary malignant neoplasm of liver and intrahepatic bile duct: Secondary | ICD-10-CM

## 2021-04-30 DIAGNOSIS — C7801 Secondary malignant neoplasm of right lung: Secondary | ICD-10-CM | POA: Diagnosis not present

## 2021-04-30 DIAGNOSIS — C7802 Secondary malignant neoplasm of left lung: Secondary | ICD-10-CM

## 2021-04-30 DIAGNOSIS — C7931 Secondary malignant neoplasm of brain: Secondary | ICD-10-CM

## 2021-04-30 DIAGNOSIS — Z170 Estrogen receptor positive status [ER+]: Secondary | ICD-10-CM

## 2021-04-30 NOTE — Progress Notes (Signed)
[place]  Telephone:([phone] Fax:([phone]) [phone]    ID: Monica [name] DOB: 1970/07/25  MR#: [id number]  WER#:[id number]  Patient Care Team: Beverley Fiedler, [place] as PCP - General (Endocrinology) [name], MD as Consulting Physician (General Surgery) Timotheus Salm, [name], MD as Consulting Physician (Oncology) [name], MD as Consulting Physician (Radiation Oncology) [name], MD as Consulting Physician (Obstetrics and Gynecology) Larey Dresser, MD as Consulting Physician (Cardiology) Janice Coffin, MD as Referring Physician (Orthopedic Surgery) Judith Part, MD as Consulting Physician (Neurosurgery) Raina Mina, RPH-CPP (Pharmacist) Ventura Sellers, MD as Consulting Physician (Psychiatry) Judith Part, MD as Consulting Physician (Neurosurgery) OTHER MD: [facility] D[facility]  I connected with Monica [name] on 04/30/21 at 12:00 PM EDT by video enabled telemedicine visit and verified that I am speaking with the correct person using two identifiers.   I discussed the limitations, risks, security and privacy concerns of performing an evaluation and management service by telemedicine and the availability of in-person appointments. I also discussed with the patient that there may be a patient responsible charge related to this service. The patient expressed understanding and agreed to proceed.   Other persons participating in the visit and their role in the encounter: None  Patient's location: Home Provider's location: Clinic   CHIEF COMPLAINT: triple positive breast cancer  CURRENT TREATMENT: tucatinib, Enhertu; denosumab Xgeva   INTERVAL HISTORY: Monica [name] was contacted today for follow up and treatment of her triple positive breast cancer.  She was started on Enhertu 04/23/2021.  Today is day 7 cycle 1.  She tolerated the treatment well.  She does not complain of worsening or severe fatigue, and she had no problems with  nausea or vomiting.    She took dexamethasone 8 mg for 2 days and then went back to her prior 2 mg daily.  She is now on an every other day dose.  She has not noted any significant problems with the dose reductions.  She continues on tucatinib, started 02/12/2021.  She is tolerating this with no side effects that she is aware to.  Her most recent echo was 01/14/2021 with an ejection fraction of 60-65%.  She also receives denosumab/Xgeva every 6 weeks.  Her most recent dose was 03/25/2021 and she will receive the next dose with her second Enhertu treatment November 2  REVIEW OF SYSTEMS: Monica [name]'s pain is well controlled on a combination of Tylenol, nonsteroidals, and occasional tramadol.  She is having fairly regular bowel movements which are neither very hard and or very soft.  She has had no problems with falls.  However she tells me she cannot concentrate.  Even if she got audiobooks she would find it difficult to follow them.  She tells me she is not quite sure that she is taking all her medications appropriately.  Also she does not feel she is going to be able to drive at any point given the fact that her vision is not very good.  Finally she is pretty much stuck at home, cannot get out, and has no company.  Her boyfriend lives in [place], more than an hour away and he comes in frequently.  There is no plan for family to come anytime soon.   COVID 19 VACCINATION STATUS: Status post Moderna x2, no booster as of December 2021   HISTORY OF CURRENT ILLNESS: From the original intake note:   Monica [name] (pronounced "uric") palpated a right supraclavicular mass and felt tenderness after stretching one day. She  brought this to her PA's attention.  The patient underwent bilateral diagnostic mammography with tomography and right breast ultrasonography at [facility] on 12/20/2017 showing: breast density category C. There was a suspicious palpable mass at the 1 o'clock position upper inner quadrant  measuring 1.4 x 1.3 x 1.3 cm located 12 cm from the nipple. There was an additional mass at the 8 o'clock radiant measuring 1.2 x 1.0 x 1.4 cm, which likely represented a cyst. Ultrasound evaluation of the right axilla demonstrates no evidence of lymphadenopathy   Accordingly on 12/29/2017 she proceeded to biopsy of the right breast area in question. The pathology from this procedure showed ([id number]): Invasive ductal carcinoma, grade III. Prognostic indicators significant for: estrogen receptor, 80% positive and progesterone receptor, 70% positive, both with strong staining intensity. Proliferation marker Ki67 at 80%. HER2 amplified with ratios HER2/CEP17 signals 3.44 and average HER2 copies per cell 10.65.   The patient's subsequent history is as detailed below.   PAST MEDICAL HISTORY: Past Medical History:  Diagnosis Date   Arthritis    lower back   Cancer [facility])    right breast cancer   Family history of breast cancer    Personal history of chemotherapy    Personal history of radiation therapy   She notes that she had a heart murmur as a child, which she grew out of.    PAST SURGICAL HISTORY: Past Surgical History:  Procedure Laterality Date   ABDOMINAL HYSTERECTOMY     APPLICATION OF CRANIAL NAVIGATION N/A 12/06/2020   Procedure: APPLICATION OF CRANIAL NAVIGATION;  Surgeon: Judith Part, MD;  Location: [place];  Service: Neurosurgery;  Laterality: N/A;  RM 20   BREAST LUMPECTOMY Right    BREAST LUMPECTOMY WITH RADIOACTIVE SEED AND SENTINEL LYMPH NODE BIOPSY Right 02/16/2018   Procedure: RIGHT BREAST LUMPECTOMY WITH RADIOACTIVE SEED AND SENTINEL LYMPH NODE BIOPSY;  Surgeon: [name], MD;  Location: [place];  Service: General;  Laterality: Right;   CRANIOTOMY Right 12/06/2020   Procedure: Right sided awake craniotomy for tumor resection;  Surgeon: Judith Part, MD;  Location: [place];  Service: Neurosurgery;  Laterality: Right;   HYMENECTOMY      IR IMAGING GUIDED PORT INSERTION  03/12/2021   IR US GUIDE BX ASP/DRAIN  12/02/2020   KYPHOPLASTY N/A 03/07/2021   Procedure: Thoracic nine, Thoracic ten KYPHOPLASTY;  Surgeon: Judith Part, MD;  Location: [place];  Service: Neurosurgery;  Laterality: N/A;   PORTACATH PLACEMENT Right 02/16/2018   Procedure: INSERTION PORT-A-CATH;  Surgeon: [name], MD;  Location: [place];  Service: General;  Laterality: Right;   RE-EXCISION OF BREAST LUMPECTOMY Right 02/22/2018   Procedure: RE-EXCISION OF RIGHT  BREAST LUMPECTOMY;  Surgeon: [name], MD;  Location: [place];  Service: General;  Laterality: Right;   REPAIR VAGINAL CUFF N/A 02/07/2017   Procedure: REPAIR VAGINAL CUFF;  Surgeon: Ena Dawley, MD;  Location: [facility];  Service: Gynecology;  Laterality: N/A;   ROBOTIC ASSISTED TOTAL HYSTERECTOMY WITH SALPINGECTOMY Left 01/20/2017   Procedure: ROBOTIC ASSISTED TOTAL HYSTERECTOMY WITH SALPINGECTOMY;  Surgeon: [name], MD;  Location: [facility];  Service: Gynecology;  Laterality: Left;   TOTAL HIP ARTHROPLASTY Right 2022   total hip and partial femur, per pt  Partial Hysterectomy without BSO   FAMILY HISTORY Family History  Problem Relation Age of Onset   Lung cancer Maternal Grandfather    Esophageal cancer Paternal Grandfather 69   Breast cancer Cousin 41  As of July 2019, the patient's father is alive at 20. The patient's mother is also alive at 58. The patient has 1 brother and 5 sisters. There was a maternal grandfather diagnosed with lung cancer at 72. There was a paternal grandfather diagnosed with esophageal cancer at 72. The patient's father had a pre-cancerous esophageal finding at age 13. There was also a maternal 1st cousin diagnosed with metastatic breast cancer at age 60.    GYNECOLOGIC HISTORY:  Patient's last menstrual period was 12/21/2016 (exact date). Menarche: 50 years old Age at first live birth: 50 years old She is  GXP2. She is status post partial hysterectomy without BSO in 2018 She never used HRT. She used oral contraception over 21 years ago with no complications.   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 The patient is separated from her husband, [name]. The patient's son [name] is in the [place] [employer] and is stationed in [place] (for 3 years beginning January 2020). He plans on working in the railroad industry after he returns. The patient's daughter [name], age 59, works at [employer], maintenance    [place]: Not in place; at the 06/20/2020 visit the patient was given the appropriate documents to complete and notarized at her discretion   HEALTH MAINTENANCE: Social History   Tobacco Use   Smoking status: Never   Smokeless tobacco: Never  Vaping Use   Vaping Use: Former  Substance Use Topics   Alcohol use: Not Currently   Drug use: No     Colonoscopy:   PAP: 2018/ prior to hysterectomy  Bone density:   No Known Allergies  Current Outpatient Medications  Medication Sig Dispense Refill   acetaminophen (TYLENOL) 500 MG tablet Take 1 tablet (500 mg total) by mouth 3 (three) times daily as needed for mild pain (or Fever >/= 101). Take with ibuprofen 400 mg     b complex vitamins capsule Take 1 capsule by mouth daily. 900 mg     Cholecalciferol (VITAMIN D) 125 MCG (5000 UT) CAPS Take 5,000 Units by mouth daily.     dexamethasone (DECADRON) 4 MG tablet Take 2 tablets (8 mg total) by mouth daily. Start the day after chemotherapy for 2 days. 30 tablet 1   DULoxetine (CYMBALTA) 20 MG capsule TAKE 1 CAPSULE BY MOUTH EVERY DAY 90 capsule 2   ferrous sulfate 325 (65 FE) MG tablet Take 325 mg by mouth daily with breakfast.     fluconazole (DIFLUCAN) 100 MG tablet TAKE 1 TABLET BY MOUTH EVERY DAY 30 tablet 1   gabapentin (NEURONTIN) 300 MG capsule Take 1 capsule (300 mg total) by mouth 3 (three) times daily. 90  capsule 6   ibuprofen (ADVIL) 400 MG tablet Take 1 tablet (400 mg total) by mouth 3 (three) times daily. Take with tylenol 500 mg (Patient taking differently: Take 400 mg by mouth 3 (three) times daily as needed for mild pain or moderate pain.) 90 tablet 3   lidocaine (LIDODERM) 5 % Place 1 patch onto the skin daily as needed (for pain- Remove & Discard patch within 12 hours or as directed by MD). (Patient not taking: Reported on 04/23/2021)     ondansetron (ZOFRAN ODT) 4 MG disintegrating tablet Take 1 tablet (4 mg total) by mouth See admin instructions. Dissolve 4 mg in the mouth three times a day and an additional 4 mg at bedtime as needed for nausea/vomiting 20 tablet 3   pantoprazole (PROTONIX)  40 MG tablet Take 40 mg by mouth daily.     polyethylene glycol (MIRALAX / GLYCOLAX) 17 g packet Take 17 g by mouth daily as needed for moderate constipation. 14 each 0   pyridOXINE (VITAMIN B-6) 100 MG tablet Take 100 mg by mouth daily.     senna-docusate (SENOKOT-S) 8.6-50 MG tablet Take 2 tablets by mouth 2 (two) times daily. (Patient not taking: Reported on 04/23/2021)     traMADol (ULTRAM) 50 MG tablet Take 1 tablet (50 mg total) by mouth every 6 (six) hours as needed. (Patient not taking: Reported on 04/23/2021) 120 tablet 0   traZODone (DESYREL) 50 MG tablet Take 1 tablet (50 mg total) by mouth at bedtime as needed for sleep. (Patient not taking: Reported on 04/23/2021)     TUKYSA 150 MG tablet Take 150 mg by mouth 2 (two) times daily.     valACYclovir (VALTREX) 1000 MG tablet Take 1 tablet (1,000 mg total) by mouth 2 (two) times daily. Take one tablet twice a day for one week, then daily 60 tablet 6   No current facility-administered medications for this visit.   Facility-Administered Medications Ordered in Other Visits  Medication Dose Route Frequency Provider Last Rate Last Admin   sodium chloride flush (NS) 0.9 % injection 10 mL  10 mL Intracatheter Once Anh Bigos, [name], MD         OBJECTIVE: White woman who appears stated age There were no vitals filed for this visit.     There is no height or weight on file to calculate BMI.   Wt Readings from Last 3 Encounters:  04/23/21 156 lb 8 oz (71 kg)  04/15/21 155 lb 9.6 oz (70.6 kg)  03/25/21 161 lb 6.4 oz (73.2 kg)  ECOG FS:1  Televisit 04/30/2021     LAB RESULTS:  CMP     Component Value Date/Time   NA 142 04/23/2021 1330   K 4.4 04/23/2021 1330   CL 110 04/23/2021 1330   CO2 24 04/23/2021 1330   GLUCOSE 171 (H) 04/23/2021 1330   BUN 28 (H) 04/23/2021 1330   CREATININE 0.62 04/23/2021 1330   CALCIUM 9.2 04/23/2021 1330   PROT 6.8 04/23/2021 1330   ALBUMIN 3.7 04/23/2021 1330   AST 88 (H) 04/23/2021 1330   ALT 106 (H) 04/23/2021 1330   ALKPHOS 129 (H) 04/23/2021 1330   BILITOT 0.8 04/23/2021 1330   GFRNONAA >60 04/23/2021 1330   GFRAA >60 06/13/2019 1138    Lab Results  Component Value Date   WBC 8.9 04/23/2021   NEUTROABS 8.3 (H) 04/23/2021   HGB 11.2 (L) 04/23/2021   HCT 34.5 (L) 04/23/2021   MCV 100.9 (H) 04/23/2021   PLT 238 04/23/2021    No results found for: LABCA2  No components found for: EFUWTK182  No results for input(s): INR in the last 168 hours.  No results found for: LABCA2  No results found for: CAN199  Lab Results  Component Value Date   CAN125 1,296.0 (H) 04/23/2021    Lab Results  Component Value Date   CAN153 599.0 (H) 04/23/2021    Lab Results  Component Value Date   CA2729 669.2 (H) 04/23/2021    No components found for: HGQUANT  No results found for: CEA1 / No results found for: CEA1   No results found for: AFPTUMOR  No results found for: CHROMOGRNA  No results found for: TOTALPROTELP, ALBUMINELP, A1GS, A2GS, BETS, BETA2SER, GAMS, MSPIKE, SPEI (this displays SPEP labs)  No results  found for: KPAFRELGTCHN, LAMBDASER, KAPLAMBRATIO (kappa/lambda light chains)  No results found for: HGBA, HGBA2QUANT, HGBFQUANT, HGBSQUAN (Hemoglobinopathy  evaluation)   No results found for: LDH  Lab Results  Component Value Date   IRON 69 01/16/2021   TIBC 361 01/16/2021   IRONPCTSAT 19 01/16/2021   (Iron and TIBC)  Lab Results  Component Value Date   FERRITIN 2,032 (H) 01/16/2021    Urinalysis    Component Value Date/Time   COLORURINE YELLOW 11/30/2020 1315   APPEARANCEUR HAZY (A) 11/30/2020 1315   LABSPEC 1.009 11/30/2020 1315   PHURINE 5.0 11/30/2020 [place] 11/30/2020 1315   [place] 11/30/2020 [place] 11/30/2020 [place] 11/30/2020 [place] 11/30/2020 1315   NITRITE NEGATIVE 11/30/2020 1315   LEUKOCYTESUR TRACE (A) 11/30/2020 1315    STUDIES: CT Chest W Contrast  Result Date: 04/15/2021 CLINICAL DATA:  Breast cancer. Chemotherapy and radiation and 2 weeks ago. EXAM: CT CHEST WITH CONTRAST TECHNIQUE: Multidetector CT imaging of the chest was performed during intravenous contrast administration. CONTRAST:  38m OMNIPAQUE IOHEXOL 350 MG/ML SOLN COMPARISON:  11/30/2020. FINDINGS: Cardiovascular: Right IJ Port-A-Cath terminates in the right atrium. Heart size normal. Small pericardial effusion is new. Mediastinum/Nodes: Low internal jugular lymph nodes are not enlarged by CT size criteria. No pathologically enlarged mediastinal, hilar, internal mammary or axillary lymph nodes. Surgical clips in the right axilla. Esophagus is grossly unremarkable. Lungs/Pleura: Mild biapical pleuroparenchymal scarring. Nodular consolidation in the right middle lobe has progressed but is difficult to measure given adjacent volume loss. Surrounding peribronchovascular nodularity. Additional bilateral pulmonary nodules measure up to 1.5 cm in the right lower lobe (5/101), similar. No pleural fluid. Airway is unremarkable. Upper Abdomen: New and enlarging heterogeneous lesions in the liver. Index lesion in the central aspect of the liver, spanning the left and right hepatic  lobes, now measures 5.8 x 6.2 cm (2/128), previously 4.0 x 5.0 cm. Enlarged portal vein. Visualized portions of adrenal glands, kidneys, spleen, pancreas, stomach and bowel are grossly unremarkable. Musculoskeletal: Progressive mottled sclerosis throughout the visualized osseous structures. New pathologic fractures of the left second, sixth and eighth ribs. T9 and T10 vertebral body augmentations. New compression deformities involving T1, T2, T4, T6 and T7. Progressive compression deformities involving T11 and T12. L1 inferior endplate compression fracture and L2 superior endplate compression fracture, new. Pathologic manubrial fracture. IMPRESSION: 1. Interval progression of hepatic and osseous metastatic disease with multiple new pathologic fractures throughout the axial and appendicular skeleton. 2. Increasing nodular consolidation in the right middle lobe, also worrisome for some for disease progression 3. New small pericardial effusion. Electronically Signed   By: MLorin PicketM.D.   On: 04/15/2021 11:16      ELIGIBLE FOR AVAILABLE RESEARCH PROTOCOL: BCEP  ASSESSMENT: 50y.o.  [place] post right breast upper inner quadrant biopsy 12/29/2017, for a clinical T1c N0, stage IA invasive ductal carcinoma, triple positive, with an MIB-1 of 80%.  (1) genetics testing 02/02/2018 though the CancerNext gene panel offered by [facility] showed no deleterious mutations in  APC, ATM, BARD1, BMPR1A,BRCA1, BRCA2, BRIP1, CDH1, CDK4, CDKN2A, CHEK2, DICER1, HOXB13, MLH1, MRE11A, MSH2, MSH6, MUTYH, NBN, NF1, PALB2, PMS2, POLD1, POLE, PTEN, RAD50, RAD51C, RAD51D, SMAD4, SMARCA4, STK11 and TP53 (sequencing and deletion/duplication); EPCAM and GREM1 (deletion/duplication only).  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>A) )   (2) right lumpectomy and sentinel lymph node sampling 02/16/2018 showed a  pT2 pN0, stage IB invasive ductal carcinoma, grade 3, with a positive  inferior margin; a total of 5 lymph nodes were removed  (a) additional surgery 02/27/2018 cleared the margins  (3) started carboplatin, docetaxel, trastuzumab and pertuzumab 03/11/2018, repeated every 21 days x 6, last dose 07/18/2018.    (a) Pertuzumab omitted after cycle 1 due to diarrhea.  (b) Gemcitabine substituted for docetaxel starting with cycle 5 due to peripheral neuropathy  (4) continued trastuzumab to total 6 months (through February 2020).  (a) echocardiogram 01/21/2018 showed an ejection fraction in the 55-60% range  (b) repeat echocardiogram 06/14/2018 showed an ejection fraction in the 60-65%  (5) adjuvant radiation 08/15/2018 - 09/28/2018  Site/dose:   The patient initially received a dose of 50.4 Gy in 28 fractions to the right breast using whole-breast tangent fields. This was delivered using a 3-D conformal technique. The patient then received a boost to the seroma. This delivered an additional 10 Gy in 5 fractions using 12E, 9E electrons with a special teletherapy technique. The total dose was 60.4 Gy.   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, involving brain, liver, lungs, and bones (7) presenting 11/29/2020 with progressive back pain:             (a) non-contrast CT abd/pelvis (renal stone study) 11/29/2020 shows bone metastases             (b) CT chest/abd/pelvis with contrast 11/30/2020 shows multiple large liver masses, multiple pulmonary nodules, and widespread lytic bone lesions             (c) MRI brain shows at least 4 brain lesions, largest 3.8 cm             (d) total spinal MRI 11/30/2020 shows pathologic fractures at T9, T12, possibly L2, as well as numerous other spine lesions, but no epidural enhancement or cord compression             (e) liver biopsy  12/02/2020 shows adenocarcinoma, prognostic panel again triple positive             (f) pre-op repeat MRI 12/01/2020 shows additional brain lesions, at least 8 of which will be targets for SRS   (8) CNS treatment:             (a) [place] 12/05/2020             (b) surgery 12/06/2020 confirmed metastatic carcinoma, estrogen receptor positive, HER2 amplified, progesterone receptor negative, with an MIB-1 of 70%.  (c) brain MRI 03/06/2021 shows minimal enhancement along the resection margin, previously noted lesions stable or decreased in size, some no longer seen, and no new brain lesions   (9) XRT to spine 12/05/2020 through 12/23/2020 Site Technique Total Dose (Gy) Dose per Fx (Gy) Completed Fx Beam Energies  Thoracic Spine: Spine_T9-L3 Complex 30/30 3 10/10 15X  Pelvis: Pelvis_sacrum Complex 30/30 3 10/10 15X   (10) advanced directives-- plans to name son as HCPOA with her father as secondary   (22) started capecitabine 01/08/2021, trastuzumab 01/20/2021, tucatinib 02/10/2021  (a) echo 01/14/2021 shows an ejection fraction in the 60-65% range.  (b) capecitabine and trastuzumab discontinued 04/15/2021 with evidence of progression  (c) tucatinib continued  (12) started denosumab/Xgeva 02/10/2021, repeated every 6 weeks  (a) received one dose pamidronate in hospital 01/16/2021  (13)  pain management:  (a) Tylenol 500 mg and Advil 400 mg 3 times a day w as needed  (b) gabapentin 3 times a day as needed  (c) tramadol 50 mg 4 times a day as needed   (d) bowel prophylaxis: MiraLAX daily, stool softeners 2 tablets twice daily as needed  (14) status post T9 and T10 kyphoplasty 03/07/2021  (15) Enhertu started 04/23/2021   PLAN: Katira is now 5 months out from definitive diagnosis of metastatic breast cancer.  We have made significant progress compared to the way she was 5 months ago.  Her pain is well controlled on nonnarcotic medications.  She does not have the horrendous constant nausea and  vomiting she had previously and she is not taking antinausea medicine which were causing her to "be like a zombie."  She is getting around the house fairly safely using her walker and has had no recent falls.  On the other hand her functional status is poor.  She tells me she cannot concentrate.  Her vision is not good enough to drive or to paint.  She is very isolated in the house and does little more than eat and sleep.  She worries what would happen if 1 morning she could not get out of bed.  I also suggested she might request a blister pack from her pharmacy so all her medications are laid out and she does not have to remember when to take what  I discussed the possibility of removing in with her mother in [place] but that does not seem to be in the cards.  I have a note out to our social worker to see if there is any way Camesha could be in a skilled nursing facility for rehab for a few weeks which I think would be a terrific boost to her.  Aside from that as she will receive her second dose of Enhertu in 2weeks.  After her fourth dose, mid December, she will be restaged.  As far as her CNS disease is concerned there is an order for brain MRI for December 1 but I do not see that that has been scheduled yet.  She will return to see [place] with her second Enhertu dose and I encouraged her to call us with any new developments that may occur before then.  Total encounter time 20 minutes.Sarajane Jews C. Chevy Virgo, MD  04/30/21 4:13 PM Medical Oncology and Hematology [address] [place], [place] [address] Tel. [phone]    Fax. [phone]   I, Wilburn Mylar, am acting as scribe for Dr. [name]. Henok Heacock.  I, Lurline Del MD, have reviewed the above documentation for accuracy and completeness, and I agree with the above.   *Total Encounter Time as defined by the Centers for Medicare and Medicaid Services includes, in addition to the face-to-face  time of a patient visit (documented in the note above) non-face-to-face time: obtaining and reviewing outside history, ordering and reviewing medications, tests or procedures, care coordination (communications with other health care professionals or caregivers) and documentation in the medical record.

## 2021-05-01 ENCOUNTER — Other Ambulatory Visit: Payer: Self-pay | Admitting: Oncology

## 2021-05-01 ENCOUNTER — Other Ambulatory Visit: Payer: Self-pay

## 2021-05-01 ENCOUNTER — Encounter (HOSPITAL_COMMUNITY): Payer: Self-pay | Admitting: Psychiatry

## 2021-05-01 ENCOUNTER — Emergency Department (EMERGENCY_DEPARTMENT_HOSPITAL)
Admission: EM | Admit: 2021-05-01 | Discharge: 2021-05-01 | Disposition: A | Discharge status: Other Acute Inpatient Hospital | Payer: Medicaid Other | Source: Home / Self Care | Attending: Emergency Medicine | Admitting: Emergency Medicine

## 2021-05-01 ENCOUNTER — Inpatient Hospital Stay (HOSPITAL_COMMUNITY)
Admission: AD | Admit: 2021-05-01 | Discharge: 2021-05-03 | DRG: 885 | Disposition: A | Discharge status: Other Acute Inpatient Hospital | Payer: Medicaid Other | Source: Intra-hospital | Attending: Psychiatry | Admitting: Psychiatry

## 2021-05-01 DIAGNOSIS — Z803 Family history of malignant neoplasm of breast: Secondary | ICD-10-CM

## 2021-05-01 DIAGNOSIS — Z170 Estrogen receptor positive status [ER+]: Secondary | ICD-10-CM

## 2021-05-01 DIAGNOSIS — C50211 Malignant neoplasm of upper-inner quadrant of right female breast: Secondary | ICD-10-CM | POA: Diagnosis present

## 2021-05-01 DIAGNOSIS — G4710 Hypersomnia, unspecified: Secondary | ICD-10-CM | POA: Diagnosis present

## 2021-05-01 DIAGNOSIS — Z90710 Acquired absence of both cervix and uterus: Secondary | ICD-10-CM

## 2021-05-01 DIAGNOSIS — Z96641 Presence of right artificial hip joint: Secondary | ICD-10-CM | POA: Diagnosis present

## 2021-05-01 DIAGNOSIS — F419 Anxiety disorder, unspecified: Secondary | ICD-10-CM | POA: Diagnosis present

## 2021-05-01 DIAGNOSIS — Z853 Personal history of malignant neoplasm of breast: Secondary | ICD-10-CM | POA: Insufficient documentation

## 2021-05-01 DIAGNOSIS — Z818 Family history of other mental and behavioral disorders: Secondary | ICD-10-CM | POA: Diagnosis not present

## 2021-05-01 DIAGNOSIS — Z800 Family history of malignant neoplasm of digestive organs: Secondary | ICD-10-CM | POA: Diagnosis not present

## 2021-05-01 DIAGNOSIS — Z923 Personal history of irradiation: Secondary | ICD-10-CM

## 2021-05-01 DIAGNOSIS — E559 Vitamin D deficiency, unspecified: Secondary | ICD-10-CM | POA: Diagnosis present

## 2021-05-01 DIAGNOSIS — Z635 Disruption of family by separation and divorce: Secondary | ICD-10-CM

## 2021-05-01 DIAGNOSIS — D701 Agranulocytosis secondary to cancer chemotherapy: Secondary | ICD-10-CM | POA: Diagnosis present

## 2021-05-01 DIAGNOSIS — K1379 Other lesions of oral mucosa: Secondary | ICD-10-CM | POA: Diagnosis present

## 2021-05-01 DIAGNOSIS — R45851 Suicidal ideations: Secondary | ICD-10-CM | POA: Insufficient documentation

## 2021-05-01 DIAGNOSIS — R531 Weakness: Secondary | ICD-10-CM | POA: Insufficient documentation

## 2021-05-01 DIAGNOSIS — G4700 Insomnia, unspecified: Secondary | ICD-10-CM | POA: Diagnosis present

## 2021-05-01 DIAGNOSIS — F329 Major depressive disorder, single episode, unspecified: Secondary | ICD-10-CM | POA: Diagnosis not present

## 2021-05-01 DIAGNOSIS — Z1501 Genetic susceptibility to malignant neoplasm of breast: Secondary | ICD-10-CM | POA: Diagnosis not present

## 2021-05-01 DIAGNOSIS — F32A Depression, unspecified: Secondary | ICD-10-CM | POA: Insufficient documentation

## 2021-05-01 DIAGNOSIS — Z9221 Personal history of antineoplastic chemotherapy: Secondary | ICD-10-CM | POA: Diagnosis not present

## 2021-05-01 DIAGNOSIS — Z801 Family history of malignant neoplasm of trachea, bronchus and lung: Secondary | ICD-10-CM | POA: Diagnosis not present

## 2021-05-01 DIAGNOSIS — Z20822 Contact with and (suspected) exposure to covid-19: Secondary | ICD-10-CM | POA: Insufficient documentation

## 2021-05-01 DIAGNOSIS — K219 Gastro-esophageal reflux disease without esophagitis: Secondary | ICD-10-CM | POA: Diagnosis present

## 2021-05-01 DIAGNOSIS — F332 Major depressive disorder, recurrent severe without psychotic features: Principal | ICD-10-CM | POA: Diagnosis present

## 2021-05-01 DIAGNOSIS — Y900 Blood alcohol level of less than 20 mg/100 ml: Secondary | ICD-10-CM | POA: Insufficient documentation

## 2021-05-01 DIAGNOSIS — T380X5A Adverse effect of glucocorticoids and synthetic analogues, initial encounter: Secondary | ICD-10-CM | POA: Diagnosis present

## 2021-05-01 LAB — MAGNESIUM: Magnesium: 2.1 mg/dL (ref 1.7–2.4)

## 2021-05-01 LAB — COMPREHENSIVE METABOLIC PANEL
ALT: 53 U/L — ABNORMAL HIGH (ref 0–44)
AST: 34 U/L (ref 15–41)
Albumin: 3.3 g/dL — ABNORMAL LOW (ref 3.5–5.0)
Alkaline Phosphatase: 123 U/L (ref 38–126)
Anion gap: 9 (ref 5–15)
BUN: 18 mg/dL (ref 6–20)
CO2: 21 mmol/L — ABNORMAL LOW (ref 22–32)
Calcium: 7.6 mg/dL — ABNORMAL LOW (ref 8.9–10.3)
Chloride: 102 mmol/L (ref 98–111)
Creatinine, Ser: 0.56 mg/dL (ref 0.44–1.00)
GFR, Estimated: >60 mL/min (ref >60)
Glucose, Bld: 96 mg/dL (ref 70–99)
Potassium: 3.5 mmol/L (ref 3.5–5.1)
Sodium: 132 mmol/L — ABNORMAL LOW (ref 135–145)
Total Bilirubin: 1 mg/dL (ref 0.3–1.2)
Total Protein: 6 g/dL — ABNORMAL LOW (ref 6.5–8.1)

## 2021-05-01 LAB — RESP PANEL BY RT-PCR (FLU A&B, COVID) ARPGX2
Influenza A by PCR: NEGATIVE
Influenza B by PCR: NEGATIVE
SARS Coronavirus 2 by RT PCR: NEGATIVE

## 2021-05-01 LAB — ACETAMINOPHEN LEVEL: Acetaminophen (Tylenol), Serum: <10 ug/mL — ABNORMAL LOW (ref 10–30)

## 2021-05-01 LAB — CBC WITH DIFFERENTIAL/PLATELET
Abs Immature Granulocytes: 0.01 10*3/uL (ref 0.00–0.07)
Basophils Absolute: 0 10*3/uL (ref 0.0–0.1)
Basophils Relative: 0 %
Eosinophils Absolute: 0.1 10*3/uL (ref 0.0–0.5)
Eosinophils Relative: 5 %
HCT: 34 % — ABNORMAL LOW (ref 36.0–46.0)
Hemoglobin: 11.2 g/dL — ABNORMAL LOW (ref 12.0–15.0)
Immature Granulocytes: 0 %
Lymphocytes Relative: 9 %
Lymphs Abs: 0.2 10*3/uL — ABNORMAL LOW (ref 0.7–4.0)
MCH: 33.1 pg (ref 26.0–34.0)
MCHC: 32.9 g/dL (ref 30.0–36.0)
MCV: 100.6 fL — ABNORMAL HIGH (ref 80.0–100.0)
Monocytes Absolute: 0.1 10*3/uL (ref 0.1–1.0)
Monocytes Relative: 2 %
Neutro Abs: 2 10*3/uL (ref 1.7–7.7)
Neutrophils Relative %: 84 %
Platelets: 151 10*3/uL (ref 150–400)
RBC: 3.38 MIL/uL — ABNORMAL LOW (ref 3.87–5.11)
RDW: 22 % — ABNORMAL HIGH (ref 11.5–15.5)
WBC: 2.4 10*3/uL — ABNORMAL LOW (ref 4.0–10.5)
nRBC: 0 % (ref 0.0–0.2)

## 2021-05-01 LAB — PHOSPHORUS: Phosphorus: 2.3 mg/dL — ABNORMAL LOW (ref 2.5–4.6)

## 2021-05-01 LAB — ETHANOL: Alcohol, Ethyl (B): <10 mg/dL (ref <10)

## 2021-05-01 MED ORDER — VITAMIN B-6 100 MG PO TABS
100.0000 mg | ORAL_TABLET | Freq: Every day | ORAL | Status: DC
  Administered 2021-05-01: 100 mg via ORAL
  Filled 2021-05-01: qty 1

## 2021-05-01 MED ORDER — PANTOPRAZOLE SODIUM 40 MG PO TBEC
40.0000 mg | DELAYED_RELEASE_TABLET | Freq: Every day | ORAL | Status: DC
  Administered 2021-05-01: 40 mg via ORAL
  Filled 2021-05-01 (×2): qty 1

## 2021-05-01 MED ORDER — DULOXETINE HCL 20 MG PO CPEP
20.0000 mg | ORAL_CAPSULE | Freq: Every day | ORAL | Status: DC
  Administered 2021-05-01: 20 mg via ORAL
  Filled 2021-05-01: qty 1

## 2021-05-01 MED ORDER — HEPARIN SOD (PORK) LOCK FLUSH 100 UNIT/ML IV SOLN
500.0000 [IU] | Freq: Once | INTRAVENOUS | Status: AC
  Administered 2021-05-01: 500 [IU]
  Filled 2021-05-01 (×2): qty 5

## 2021-05-01 MED ORDER — VALACYCLOVIR HCL 500 MG PO TABS: 1000.0000 mg | ORAL_TABLET | Freq: Every day | ORAL | Status: DC

## 2021-05-01 MED ORDER — TUCATINIB 150 MG PO TABS
150.0000 mg | ORAL_TABLET | Freq: Two times a day (BID) | ORAL | Status: DC
  Administered 2021-05-01: 150 mg via ORAL

## 2021-05-01 MED ORDER — SODIUM CHLORIDE 0.9 % IV BOLUS
1000.0000 mL | Freq: Once | INTRAVENOUS | Status: AC
  Administered 2021-05-01: 1000 mL via INTRAVENOUS

## 2021-05-01 MED ORDER — ACETAMINOPHEN 325 MG PO TABS
650.0000 mg | ORAL_TABLET | ORAL | Status: DC | PRN
Start: 2021-05-01 — End: 2021-05-01

## 2021-05-01 MED ORDER — VALACYCLOVIR HCL 500 MG PO TABS
1000.0000 mg | ORAL_TABLET | Freq: Every day | ORAL | Status: DC
  Administered 2021-05-01: 1000 mg via ORAL
  Filled 2021-05-01: qty 2

## 2021-05-01 MED ORDER — ALUM & MAG HYDROXIDE-SIMETH 200-200-20 MG/5ML PO SUSP: 30.0000 mL | Freq: Four times a day (QID) | ORAL | Status: DC | PRN

## 2021-05-01 MED ORDER — GABAPENTIN 300 MG PO CAPS
300.0000 mg | ORAL_CAPSULE | Freq: Three times a day (TID) | ORAL | Status: DC
  Administered 2021-05-01 (×2): 300 mg via ORAL
  Filled 2021-05-01 (×2): qty 1

## 2021-05-01 MED ORDER — LORAZEPAM 0.5 MG PO TABS: 0.5000 mg | ORAL_TABLET | Freq: Four times a day (QID) | ORAL | Status: DC | PRN

## 2021-05-01 MED ORDER — ZOLPIDEM TARTRATE 5 MG PO TABS: 5.0000 mg | ORAL_TABLET | Freq: Every evening | ORAL | Status: DC | PRN

## 2021-05-01 MED ORDER — ONDANSETRON HCL 4 MG/2ML IJ SOLN
4.0000 mg | Freq: Once | INTRAMUSCULAR | Status: AC
  Administered 2021-05-01: 4 mg via INTRAVENOUS
  Filled 2021-05-01: qty 2

## 2021-05-01 NOTE — ED Notes (Signed)
Pt ambulatory to restroom. Unable to provide urine sample at this time. 

## 2021-05-01 NOTE — ED Provider Notes (Signed)
[facility] DEPT Provider Note   CSN: [id number] Arrival date & time: 05/01/21  1009     History No chief complaint on file.   Monica [name] is a 50 y.o. female.  The history is provided by the patient and medical records. No language interpreter was used.   50 year old female significant history of breast cancer currently undergoing chemotherapy and radiation brought here via EMS with complaint of for depression.  Patient states she is here because she feels very depressed with having suicidal thoughts but no specific plan.  She attributed to her depression stemming from active cancer diagnosis that she thought she previously have.  But then this cancer returns.  She also mention that she lives alone and she has no motivation to get up this morning to do anything.  Yesterday she also endorsed feeling nauseous, has a couple episodes of vomiting and loose stools.  She feels dehydrated because she does not have any appetite due to loss of taste and smell has been ongoing for several weeks.  She has been chemotherapy for the past week but felt her symptoms started before that.  She denies fever chills runny nose sneezing coughing or dysuria.  She denies homicidal ideation auditory or visual hallucination.  She is sleeping more.  She has never been formally diagnosed with depression.  Denies any active pain at this time.    Past Medical History:  Diagnosis Date   Arthritis    lower back   Cancer [facility])    right breast cancer   Family history of breast cancer    Personal history of chemotherapy    Personal history of radiation therapy     Patient Active Problem List   Diagnosis Date Noted   Hypokalemia    Hypomagnesemia    Hypercalcemia    Pain 01/14/2021   Lung metastases ([place]) 12/31/2020   Liver metastases ([place]) 12/31/2020   Brain tumor ([place]) 12/06/2020   Palliative care by specialist    Bone metastases [facility])    Metastatic malignant neoplasm ([place])     Brain metastases ([place])    Cancer related pain 11/30/2020   Port-A-Cath in place 03/08/2018   Goals of care, counseling/discussion 02/03/2018   Family history of breast cancer 01/12/2018   Malignant neoplasm of upper-inner quadrant of right breast in female, estrogen receptor positive ([place]) 01/05/2018   Vaginal bleeding 02/07/2017   Anemia 02/07/2017   Orthostasis 02/07/2017   S/P laparoscopic hysterectomy 01/20/2017    Past Surgical History:  Procedure Laterality Date   ABDOMINAL HYSTERECTOMY     APPLICATION OF CRANIAL NAVIGATION N/A 12/06/2020   Procedure: APPLICATION OF CRANIAL NAVIGATION;  Surgeon: Judith Part, MD;  Location: [place];  Service: Neurosurgery;  Laterality: N/A;  RM 20   BREAST LUMPECTOMY Right    BREAST LUMPECTOMY WITH RADIOACTIVE SEED AND SENTINEL LYMPH NODE BIOPSY Right 02/16/2018   Procedure: RIGHT BREAST LUMPECTOMY WITH RADIOACTIVE SEED AND SENTINEL LYMPH NODE BIOPSY;  Surgeon: [name], MD;  Location: [place];  Service: General;  Laterality: Right;   CRANIOTOMY Right 12/06/2020   Procedure: Right sided awake craniotomy for tumor resection;  Surgeon: Judith Part, MD;  Location: [place];  Service: Neurosurgery;  Laterality: Right;   HYMENECTOMY     IR IMAGING GUIDED PORT INSERTION  03/12/2021   IR US GUIDE BX ASP/DRAIN  12/02/2020   KYPHOPLASTY N/A 03/07/2021   Procedure: Thoracic nine, Thoracic ten KYPHOPLASTY;  Surgeon: Judith Part, MD;  Location: [facility]  OR;  Service: Neurosurgery;  Laterality: N/A;   PORTACATH PLACEMENT Right 02/16/2018   Procedure: INSERTION PORT-A-CATH;  Surgeon: [name], MD;  Location: [place];  Service: General;  Laterality: Right;   RE-EXCISION OF BREAST LUMPECTOMY Right 02/22/2018   Procedure: RE-EXCISION OF RIGHT  BREAST LUMPECTOMY;  Surgeon: [name], MD;  Location: [place];  Service: General;  Laterality: Right;   REPAIR VAGINAL CUFF N/A  02/07/2017   Procedure: REPAIR VAGINAL CUFF;  Surgeon: Ena Dawley, MD;  Location: [facility];  Service: Gynecology;  Laterality: N/A;   ROBOTIC ASSISTED TOTAL HYSTERECTOMY WITH SALPINGECTOMY Left 01/20/2017   Procedure: ROBOTIC ASSISTED TOTAL HYSTERECTOMY WITH SALPINGECTOMY;  Surgeon: [name], MD;  Location: [facility];  Service: Gynecology;  Laterality: Left;   TOTAL HIP ARTHROPLASTY Right 2022   total hip and partial femur, per pt     OB History     Gravida  2   Para  2   Term      Preterm      AB      Living         SAB      IAB      Ectopic      Multiple      Live Births              Family History  Problem Relation Age of Onset   Lung cancer Maternal Grandfather    Esophageal cancer Paternal Grandfather 27   Breast cancer Cousin 63    Social History   Tobacco Use   Smoking status: Never   Smokeless tobacco: Never  Vaping Use   Vaping Use: Former  Substance Use Topics   Alcohol use: Not Currently   Drug use: No    Home Medications Prior to Admission medications   Medication Sig Start Date End Date Taking? Authorizing Provider  acetaminophen (TYLENOL) 500 MG tablet Take 1 tablet (500 mg total) by mouth 3 (three) times daily as needed for mild pain (or Fever >/= 101). Take with ibuprofen 400 mg 02/10/21   Magrinat, [name], MD  b complex vitamins capsule Take 1 capsule by mouth daily. 900 mg    [provider]  Cholecalciferol (VITAMIN D) 125 MCG (5000 UT) CAPS Take 5,000 Units by mouth daily. 06/20/20   Magrinat, [name], MD  dexamethasone (DECADRON) 4 MG tablet Take 2 tablets (8 mg total) by mouth daily. Start the day after chemotherapy for 2 days. 04/15/21   Magrinat, [name], MD  DULoxetine (CYMBALTA) 20 MG capsule TAKE 1 CAPSULE BY MOUTH EVERY DAY 03/04/21   Magrinat, [name], MD  ferrous sulfate 325 (65 FE) MG tablet Take 325 mg by mouth daily with breakfast.    [provider]  fluconazole (DIFLUCAN) 100 MG tablet TAKE 1  TABLET BY MOUTH EVERY DAY 04/14/21   Magrinat, [name], MD  gabapentin (NEURONTIN) 300 MG capsule Take 1 capsule (300 mg total) by mouth 3 (three) times daily. 02/13/21   Magrinat, [name], MD  ibuprofen (ADVIL) 400 MG tablet Take 1 tablet (400 mg total) by mouth 3 (three) times daily. Take with tylenol 500 mg Patient taking differently: Take 400 mg by mouth 3 (three) times daily as needed for mild pain or moderate pain. 02/13/21   Magrinat, [name], MD  lidocaine (LIDODERM) 5 % Place 1 patch onto the skin daily as needed (for pain- Remove & Discard patch within 12 hours or as directed by MD).  Patient not taking: Reported on 04/23/2021 01/21/21   Eugenie Filler, MD  ondansetron (ZOFRAN ODT) 4 MG disintegrating tablet Take 1 tablet (4 mg total) by mouth See admin instructions. Dissolve 4 mg in the mouth three times a day and an additional 4 mg at bedtime as needed for nausea/vomiting 03/19/21   Magrinat, [name], MD  pantoprazole (PROTONIX) 40 MG tablet Take 40 mg by mouth daily. 02/06/21   [provider]  polyethylene glycol (MIRALAX / GLYCOLAX) 17 g packet Take 17 g by mouth daily as needed for moderate constipation. 01/21/21   Eugenie Filler, MD  pyridOXINE (VITAMIN B-6) 100 MG tablet Take 100 mg by mouth daily.    [provider]  senna-docusate (SENOKOT-S) 8.6-50 MG tablet Take 2 tablets by mouth 2 (two) times daily. Patient not taking: Reported on 04/23/2021 01/21/21   Eugenie Filler, MD  traMADol (ULTRAM) 50 MG tablet Take 1 tablet (50 mg total) by mouth every 6 (six) hours as needed. Patient not taking: Reported on 04/23/2021 02/10/21   Magrinat, [name], MD  traZODone (DESYREL) 50 MG tablet Take 1 tablet (50 mg total) by mouth at bedtime as needed for sleep. Patient not taking: Reported on 04/23/2021 01/21/21   Eugenie Filler, MD  TUKYSA 150 MG tablet Take 150 mg by mouth 2 (two) times daily. 02/26/21   [provider]  valACYclovir (VALTREX) 1000 MG tablet  Take 1 tablet (1,000 mg total) by mouth 2 (two) times daily. Take one tablet twice a day for one week, then daily 04/15/21   Magrinat, [name], MD  prochlorperazine (COMPAZINE) 5 MG tablet Take 1 tablet (5 mg total) by mouth every 8 (eight) hours as needed for nausea or vomiting. 03/31/21 04/15/21  Magrinat, [name], MD    Allergies    Patient has no known allergies.  Review of Systems   Review of Systems  All other systems reviewed and are negative.  Physical Exam Updated Vital Signs BP 121/80   Pulse 78   Temp 97.7 F (36.5 C) (Oral)   Resp 11   LMP 12/21/2016 (Exact Date)   SpO2 100%   Physical Exam Vitals and nursing note reviewed.  Constitutional:      General: She is not in acute distress.    Appearance: She is well-developed.  HENT:     Head: Atraumatic.  Eyes:     Conjunctiva/sclera: Conjunctivae normal.  Cardiovascular:     Rate and Rhythm: Normal rate and regular rhythm.     Pulses: Normal pulses.     Heart sounds: Normal heart sounds.  Pulmonary:     Effort: Pulmonary effort is normal.  Abdominal:     Palpations: Abdomen is soft.     Tenderness: There is no abdominal tenderness.  Musculoskeletal:     Cervical back: Neck supple.     Comments: Poor effort but equal strength all 4 extremities.  Skin:    Findings: No rash.  Neurological:     Mental Status: She is alert.  Psychiatric:        Mood and Affect: Mood normal. Affect is flat.        Speech: Speech normal.        Behavior: Behavior is cooperative.        Thought Content: Thought content includes suicidal ideation. Thought content does not include homicidal ideation.    ED Results / Procedures / Treatments   Labs (all labs ordered are listed, but only abnormal results are  displayed) Labs Reviewed  ACETAMINOPHEN LEVEL - Abnormal; Notable for the following components:      Result Value   Acetaminophen (Tylenol), Serum <10 (*)    All other components within normal limits  COMPREHENSIVE METABOLIC  PANEL - Abnormal; Notable for the following components:   Sodium 132 (*)    CO2 21 (*)    Calcium 7.6 (*)    Total Protein 6.0 (*)    Albumin 3.3 (*)    ALT 53 (*)    All other components within normal limits  CBC WITH DIFFERENTIAL/PLATELET - Abnormal; Notable for the following components:   WBC 2.4 (*)    RBC 3.38 (*)    Hemoglobin 11.2 (*)    HCT 34.0 (*)    MCV 100.6 (*)    RDW 22.0 (*)    Lymphs Abs 0.2 (*)    All other components within normal limits  PHOSPHORUS - Abnormal; Notable for the following components:   Phosphorus 2.3 (*)    All other components within normal limits  RESP PANEL BY RT-PCR (FLU A&B, COVID) ARPGX2  ETHANOL  MAGNESIUM  RAPID URINE DRUG SCREEN, HOSP PERFORMED  URINALYSIS, ROUTINE W REFLEX MICROSCOPIC    EKG None ED ECG REPORT   Date: 05/01/2021  Rate: 84  Rhythm: normal sinus rhythm  QRS Axis: left  Intervals: PR shortened  ST/T Wave abnormalities: nonspecific ST changes  Conduction Disutrbances:none  Narrative Interpretation:   Old EKG Reviewed: unchanged  I have personally reviewed the EKG tracing and agree with the computerized printout as noted.   Radiology No results found.  Procedures Procedures   Medications Ordered in ED Medications  acetaminophen (TYLENOL) tablet 650 mg (has no administration in time range)  zolpidem (AMBIEN) tablet 5 mg (has no administration in time range)  alum & mag hydroxide-simeth (MAALOX/MYLANTA) 200-200-20 MG/5ML suspension 30 mL (has no administration in time range)  DULoxetine (CYMBALTA) DR capsule 20 mg (has no administration in time range)  gabapentin (NEURONTIN) capsule 300 mg (has no administration in time range)  LORazepam (ATIVAN) tablet 0.5 mg (has no administration in time range)  pantoprazole (PROTONIX) EC tablet 40 mg (has no administration in time range)  pyridOXINE (VITAMIN B-6) tablet 100 mg (has no administration in time range)  tucatinib (TUKYSA) tablet 150 mg (has no  administration in time range)  ondansetron (ZOFRAN) injection 4 mg (4 mg Intravenous Given 05/01/21 1110)  sodium chloride 0.9 % bolus 1,000 mL (1,000 mLs Intravenous New Bag/Given 05/01/21 1110)    ED Course  I have reviewed the triage vital signs and the nursing notes.  Pertinent labs & imaging results that were available during my care of the patient were reviewed by me and considered in my medical decision making (see chart for details).    MDM Rules/Calculators/A&P                           BP 111/77   Pulse 77   Temp 97.7 F (36.5 C) (Oral)   Resp 15   LMP 12/21/2016 (Exact Date)   SpO2 99%   Final Clinical Impression(s) / ED Diagnoses Final diagnoses:  Depression, unspecified depression type  Suicidal ideation  General weakness    Rx / DC Orders ED Discharge Orders     None      10:40 AM Patient with history of metastatic breast cancer currently receiving chemo who is here with report of feeling depressed as well as having suicidal ideation  without specific plan.  Patient's labs are at baseline..  Evidence of neutropenia likely secondary to ongoing chemotherapy treatment.  No significant electrolyte derangement.  Will consult TTS for psychiatric assistance.  1:40 PM Patient is medically cleared, TTS will be involved in psychiatric care.   Domenic Moras, PA-C 05/01/21 1517    Tegeler, Gwenyth Allegra, MD 05/01/21 [phone]

## 2021-05-01 NOTE — BH Assessment (Signed)
[place] [facility] [place] Assessment Progress Note   Per Sheran Fava, NP, this pt requires psychiatric hospitalization at this time.  [place], RN, [facility] has assigned pt to [facility] Rm 403-1 to the service of Dr Berdine Addison.  [facility] will be ready to receive pt at 20:30.  Pt will need to sign Voluntary Admission and Consent for Treatment, which this writer has asked pt's nurse, Joellen Jersey, to handle.  EDP Lacretia Leigh, MD and Joellen Jersey, have been notified.  Please send original paperwork along with pt via [facility], and to call report to [phone].  Jalene Mullet, [place] Coordinator [phone]

## 2021-05-01 NOTE — Progress Notes (Signed)
Monica [name] is a 50 y.o. female patient admitted with depression and suicidal ideations.  Patient reports presents from home with worsening depression symptoms to include hopelessness, worthlessness, guilty, increased anxiety, anhedonia, suicidal thoughts, and suicidal ideations with a plan to overdose on her medications at home.  Patient reports increase in suicidal thoughts. At the time of the evaluation she denies any current suicidal ideations, but states" I am here because I no longer want to live.  Like I want to die."   She identifies stressors as her recent breast cancer diagnosis, loneliness, and inability to care for herself. She denies any previous psychiatric history of depression, anxiety, suicide attempts, or self-harm behavior. Patient verbally contracts for safety at the time of admission.  Patient stated that she has a right hip replacement which causes an unsteady gait, patient is a high fall risk and requested the use of a cane or walker. Patient also expressed that she may need some assistance with ADL's.   Skin was assessed and found to be clear of any abnormal marks apart from a scar on right upper chest area where she has a port. PT searched and no contraband found, POC and unit policies explained and understanding verbalized. Consents obtained.  Pt had no additional questions or concerns.

## 2021-05-01 NOTE — ED Triage Notes (Signed)
Patient [employer]). Patient states that they woke up this morning with extreme fatigue, and a lack of emotions. Patient has stage 4 breast cx. Is receiving chemo at [facility] cancer center. Patient last had treatment over a week ago.  Vitals: BP: 112/60 (77) HR: 95 RR: 15 SPO2: 97% RA

## 2021-05-01 NOTE — Tx Team (Signed)
Initial Treatment Plan 05/01/2021 11:13 PM Monica [name] [id number]    PATIENT STRESSORS: Health problems     PATIENT STRENGTHS: Communication skills    PATIENT IDENTIFIED PROBLEMS: Poor coping skills                     DISCHARGE CRITERIA:  Adequate post-discharge living arrangements  PRELIMINARY DISCHARGE PLAN: Return to previous living arrangement  PATIENT/FAMILY INVOLVEMENT: This treatment plan has been presented to and reviewed with the patient, Monica [name], and/or family member, .  The patient and family have been given the opportunity to ask questions and make suggestions.  Monica Rasmussen, RN 05/01/2021, 11:13 PM

## 2021-05-01 NOTE — ED Notes (Signed)
Patient discharged in route to [facility] by [facility].  All belongings returned.

## 2021-05-01 NOTE — Consult Note (Signed)
[facility] Consult   Reason for Consult:  Suicidal and Depression Referring Physician: Emergency room provider Patient Identification: Monica [name] MRN:  [id number] Principal Diagnosis: <principal problem not specified> Diagnosis:  Active Problems:   * No active hospital problems. *   Total Time spent with patient: 45 minutes  Subjective:   Monica [name] is a 50 y.o. female patient admitted with depression and suicidal ideations.  Patient reports presents from home with worsening depression symptoms to include hopelessness, worthlessness, guilty, increased anxiety, anhedonia, suicidal thoughts, and suicidal ideations with a plan to overdose on her medications at home.  Patient reports increase in suicidal thoughts, and describes them as fleeting.  At the time of the evaluation she denies any current suicidal ideations, but states" I am here because I no longer want to live.  Like I want to die."  She identifies stressors as her recent breast cancer diagnosis, loneliness, and inability to care for herself. "  It is very difficult for me to remember to take my meds, eat, drink anything to live has become a challenge.  All by myself or my apartment, and no longer want to live."  Patient denies any mania, paranoia, psychosis, hallucinations, otherwise psychiatric psychosis.  She denies any previous psychiatric history of depression, anxiety, suicide attempts, or self-harm behavior.  She denies any substance abuse and or legal charges.  She reports a family history of depression in her sister, and a daughter with a diagnosis of depression and anxiety.  At this time patient continues to endorse passive suicidal ideations.  She is able to contract for safety while on the unit.  She denies any homicidal ideations and is able to contract for safety.  Patient will need inpatient psychiatric criteria for crisis stabilization, medication management, and therapy.  HPI:  50 year old female  significant history of breast cancer currently undergoing chemotherapy and radiation brought here via EMS with complaint of for depression.  Patient states she is here because she feels very depressed with having suicidal thoughts but no specific plan.  She attributed to her depression stemming from active cancer diagnosis that she thought she previously have.  But then this cancer returns.  She also mention that she lives alone and she has no motivation to get up this morning to do anything.  She feels dehydrated because she does not have any appetite due to loss of taste and smell has been ongoing for several weeks. She denies homicidal ideation auditory or visual hallucination.  She is sleeping more.  She has never been formally diagnosed with depression.  Denies any active pain at this time.  Past Psychiatric History: See above  Risk to Self: Yes Risk to Others: Denies  Prior Inpatient Therapy: Denies  Prior Outpatient Therapy: Denies  Past Medical History:  Past Medical History:  Diagnosis Date   Arthritis    lower back   Cancer ([place])    right breast cancer   Family history of breast cancer    Personal history of chemotherapy    Personal history of radiation therapy     Past Surgical History:  Procedure Laterality Date   ABDOMINAL HYSTERECTOMY     APPLICATION OF CRANIAL NAVIGATION N/A 12/06/2020   Procedure: APPLICATION OF CRANIAL NAVIGATION;  Surgeon: Judith Part, MD;  Location: [facility];  Service: Neurosurgery;  Laterality: N/A;  RM 20   BREAST LUMPECTOMY Right    BREAST LUMPECTOMY WITH RADIOACTIVE SEED AND SENTINEL LYMPH NODE BIOPSY Right 02/16/2018   Procedure: RIGHT BREAST  LUMPECTOMY WITH RADIOACTIVE SEED AND SENTINEL LYMPH NODE BIOPSY;  Surgeon: [name], MD;  Location: [facility];  Service: General;  Laterality: Right;   CRANIOTOMY Right 12/06/2020   Procedure: Right sided awake craniotomy for tumor resection;  Surgeon: Judith Part, MD;   Location: [facility];  Service: Neurosurgery;  Laterality: Right;   HYMENECTOMY     IR IMAGING GUIDED PORT INSERTION  03/12/2021   IR US GUIDE BX ASP/DRAIN  12/02/2020   KYPHOPLASTY N/A 03/07/2021   Procedure: Thoracic nine, Thoracic ten KYPHOPLASTY;  Surgeon: Judith Part, MD;  Location: [facility];  Service: Neurosurgery;  Laterality: N/A;   PORTACATH PLACEMENT Right 02/16/2018   Procedure: INSERTION PORT-A-CATH;  Surgeon: [name], MD;  Location: [facility];  Service: General;  Laterality: Right;   RE-EXCISION OF BREAST LUMPECTOMY Right 02/22/2018   Procedure: RE-EXCISION OF RIGHT  BREAST LUMPECTOMY;  Surgeon: [name], MD;  Location: [facility];  Service: General;  Laterality: Right;   REPAIR VAGINAL CUFF N/A 02/07/2017   Procedure: REPAIR VAGINAL CUFF;  Surgeon: Ena Dawley, MD;  Location: [facility];  Service: Gynecology;  Laterality: N/A;   ROBOTIC ASSISTED TOTAL HYSTERECTOMY WITH SALPINGECTOMY Left 01/20/2017   Procedure: ROBOTIC ASSISTED TOTAL HYSTERECTOMY WITH SALPINGECTOMY;  Surgeon: [name], MD;  Location: [facility];  Service: Gynecology;  Laterality: Left;   TOTAL HIP ARTHROPLASTY Right 2022   total hip and partial femur, per pt   Family History:  Family History  Problem Relation Age of Onset   Lung cancer Maternal Grandfather    Esophageal cancer Paternal Grandfather 29   Breast cancer Cousin 69   Family Psychiatric  History: Sister diagnosed with depression, daughter diagnosed with depression and anxiety Social History:  Social History   Substance and Sexual Activity  Alcohol Use Not Currently     Social History   Substance and Sexual Activity  Drug Use No    Social History   Socioeconomic History   Marital status: Legally Separated    Spouse name: Not on file   Number of children: Not on file   Years of education: Not on file   Highest education level: Not on file  Occupational History   Not on file  Tobacco Use    Smoking status: Never   Smokeless tobacco: Never  Vaping Use   Vaping Use: Former  Substance and Sexual Activity   Alcohol use: Not Currently   Drug use: No   Sexual activity: Not Currently    Birth control/protection: Surgical  Other Topics Concern   Not on file  Social History Narrative   Not on file   Social Determinants of Health   Financial Resource Strain: Not on file  Food Insecurity: Not on file  Transportation Needs: Not on file  Physical Activity: Not on file  Stress: Not on file  Social Connections: Not on file   Additional Social History:    Allergies:  No Known Allergies  Labs:  Results for orders placed or performed during the hospital encounter of 05/01/21 (from the past 48 hour(s))  Resp Panel by RT-PCR (Flu A&B, Covid) Nasopharyngeal Swab     Status: None   Collection Time: 05/01/21 10:39 AM   Specimen: Nasopharyngeal Swab; Nasopharyngeal(NP) swabs in vial transport medium  Result Value Ref Range   SARS Coronavirus 2 by RT PCR NEGATIVE NEGATIVE    Comment: (NOTE) SARS-CoV-2 target nucleic acids are NOT DETECTED.  The SARS-CoV-2 RNA is generally detectable in upper  respiratory specimens during the acute phase of infection. The lowest concentration of SARS-CoV-2 viral copies this assay can detect is 138 copies/mL. A negative result does not preclude SARS-Cov-2 infection and should not be used as the sole basis for treatment or other patient management decisions. A negative result may occur with  improper specimen collection/handling, submission of specimen other than nasopharyngeal swab, presence of viral mutation(s) within the areas targeted by this assay, and inadequate number of viral copies(<138 copies/mL). A negative result must be combined with clinical observations, patient history, and epidemiological information. The expected result is Negative.  Fact Sheet for Patients:  EntrepreneurPulse.com.au  Fact Sheet for Healthcare  Providers:  IncredibleEmployment.be  This test is no t yet approved or cleared by the [place] FDA and  has been authorized for detection and/or diagnosis of SARS-CoV-2 by FDA under an Emergency Use Authorization (EUA). This EUA will remain  in effect (meaning this test can be used) for the duration of the COVID-19 declaration under Section 564(b)(1) of the Act, 21 U.S.C.section 360bbb-3(b)(1), unless the authorization is terminated  or revoked sooner.       Influenza A by PCR NEGATIVE NEGATIVE   Influenza B by PCR NEGATIVE NEGATIVE    Comment: (NOTE) The Xpert Xpress SARS-CoV-2/FLU/RSV plus assay is intended as an aid in the diagnosis of influenza from Nasopharyngeal swab specimens and should not be used as a sole basis for treatment. Nasal washings and aspirates are unacceptable for Xpert Xpress SARS-CoV-2/FLU/RSV testing.  Fact Sheet for Patients: EntrepreneurPulse.com.au  Fact Sheet for Healthcare Providers: IncredibleEmployment.be  This test is not yet approved or cleared by the [place] FDA and has been authorized for detection and/or diagnosis of SARS-CoV-2 by FDA under an Emergency Use Authorization (EUA). This EUA will remain in effect (meaning this test can be used) for the duration of the COVID-19 declaration under Section 564(b)(1) of the Act, 21 U.S.C. section 360bbb-3(b)(1), unless the authorization is terminated or revoked.  Performed at [facility], [place] [address] [place], [place] [address]   Acetaminophen level     Status: Abnormal   Collection Time: 05/01/21 11:16 AM  Result Value Ref Range   Acetaminophen (Tylenol), Serum <10 (L) 10 - 30 ug/mL    Comment: Performed at [facility], [place] [address], [place], [place] [address]  Comprehensive metabolic panel     Status: Abnormal   Collection Time: 05/01/21 11:16 AM  Result Value Ref Range   Sodium 132  (L) 135 - 145 mmol/L   Potassium 3.5 3.5 - 5.1 mmol/L   Chloride 102 98 - 111 mmol/L   CO2 21 (L) 22 - 32 mmol/L   Glucose, Bld 96 70 - 99 mg/dL    Comment: Glucose reference range applies only to samples taken after fasting for at least 8 hours.   BUN 18 6 - 20 mg/dL   Creatinine, Ser 0.56 0.44 - 1.00 mg/dL   Calcium 7.6 (L) 8.9 - 10.3 mg/dL   Total Protein 6.0 (L) 6.5 - 8.1 g/dL   Albumin 3.3 (L) 3.5 - 5.0 g/dL   AST 34 15 - 41 U/L   ALT 53 (H) 0 - 44 U/L   Alkaline Phosphatase 123 38 - 126 U/L   Total Bilirubin 1.0 0.3 - 1.2 mg/dL   GFR, Estimated >60 >60 mL/min    Comment: (NOTE) Calculated using the CKD-EPI Creatinine Equation (2021)    Anion gap 9 5 - 15    Comment: Performed at [facility]  [place] [address], [place], [place] [address]  Ethanol     Status: None   Collection Time: 05/01/21 11:16 AM  Result Value Ref Range   Alcohol, Ethyl (B) <10 <10 mg/dL    Comment: (NOTE) Lowest detectable limit for serum alcohol is 10 mg/dL.  For medical purposes only. Performed at [facility], [place] [address], [place], [place] [address]   CBC with Differential     Status: Abnormal   Collection Time: 05/01/21 11:16 AM  Result Value Ref Range   WBC 2.4 (L) 4.0 - 10.5 K/uL   RBC 3.38 (L) 3.87 - 5.11 MIL/uL   Hemoglobin 11.2 (L) 12.0 - 15.0 g/dL   HCT 34.0 (L) 36.0 - 46.0 %   MCV 100.6 (H) 80.0 - 100.0 fL   MCH 33.1 26.0 - 34.0 pg   MCHC 32.9 30.0 - 36.0 g/dL   RDW 22.0 (H) 11.5 - 15.5 %   Platelets 151 150 - 400 K/uL   nRBC 0.0 0.0 - 0.2 %   Neutrophils Relative % 84 %   Neutro Abs 2.0 1.7 - 7.7 K/uL   Lymphocytes Relative 9 %   Lymphs Abs 0.2 (L) 0.7 - 4.0 K/uL   Monocytes Relative 2 %   Monocytes Absolute 0.1 0.1 - 1.0 K/uL   Eosinophils Relative 5 %   Eosinophils Absolute 0.1 0.0 - 0.5 K/uL   Basophils Relative 0 %   Basophils Absolute 0.0 0.0 - 0.1 K/uL   Immature Granulocytes 0 %   Abs Immature Granulocytes 0.01 0.00 - 0.07 K/uL     Comment: Performed at [facility], [place] [address] [place], [place] [address]  Magnesium     Status: None   Collection Time: 05/01/21 11:16 AM  Result Value Ref Range   Magnesium 2.1 1.7 - 2.4 mg/dL    Comment: Performed at [facility], [place] [address], [place], [place] [address]  Phosphorus     Status: Abnormal   Collection Time: 05/01/21 11:16 AM  Result Value Ref Range   Phosphorus 2.3 (L) 2.5 - 4.6 mg/dL    Comment: Performed at [facility], [place] [address], [place], [place] [address]    Current Facility-Administered Medications  Medication Dose Route Frequency Provider Last Rate Last Admin   acetaminophen (TYLENOL) tablet 650 mg  650 mg Oral Q4H PRN Domenic Moras, PA-C       alum & mag hydroxide-simeth (MAALOX/MYLANTA) 200-200-20 MG/5ML suspension 30 mL  30 mL Oral Q6H PRN Domenic Moras, PA-C       DULoxetine (CYMBALTA) DR capsule 20 mg  20 mg Oral Daily Domenic Moras, PA-C       gabapentin (NEURONTIN) capsule 300 mg  300 mg Oral TID Domenic Moras, PA-C       LORazepam (ATIVAN) tablet 0.5 mg  0.5 mg Oral QID PRN Domenic Moras, PA-C       pantoprazole (PROTONIX) EC tablet 40 mg  40 mg Oral Daily Domenic Moras, PA-C   40 mg at 05/01/21 1409   pyridOXINE (VITAMIN B-6) tablet 100 mg  100 mg Oral Daily Domenic Moras, PA-C       tucatinib (TUKYSA) tablet 150 mg  150 mg Oral BID Domenic Moras, PA-C       valACYclovir (VALTREX) tablet 1,000 mg  1,000 mg Oral Daily Tegeler, Gwenyth Allegra, MD       zolpidem (AMBIEN) tablet 5 mg  5 mg Oral QHS PRN Domenic Moras, PA-C  Current Outpatient Medications  Medication Sig Dispense Refill   acetaminophen (TYLENOL) 500 MG tablet Take 1 tablet (500 mg total) by mouth 3 (three) times daily as needed for mild pain (or Fever >/= 101). Take with ibuprofen 400 mg     b complex vitamins capsule Take 1 capsule by mouth daily. 900 mg     capecitabine (XELODA) 500 MG tablet Take 1,500 mg by mouth 2 (two) times  daily. Takes daily for 21 days, then 7 days off, then repeat cycle     Cholecalciferol (VITAMIN D) 125 MCG (5000 UT) CAPS Take 5,000 Units by mouth daily.     dexamethasone (DECADRON) 4 MG tablet Take 2 tablets (8 mg total) by mouth daily. Start the day after chemotherapy for 2 days. 30 tablet 1   DULoxetine (CYMBALTA) 20 MG capsule TAKE 1 CAPSULE BY MOUTH EVERY DAY (Patient taking differently: Take 20 mg by mouth daily.) 90 capsule 2   ferrous sulfate 325 (65 FE) MG tablet Take 325 mg by mouth daily with breakfast.     fluconazole (DIFLUCAN) 100 MG tablet TAKE 1 TABLET BY MOUTH EVERY DAY (Patient taking differently: Take 100 mg by mouth daily.) 30 tablet 1   gabapentin (NEURONTIN) 300 MG capsule Take 1 capsule (300 mg total) by mouth 3 (three) times daily. 90 capsule 6   ibuprofen (ADVIL) 200 MG tablet Take 400 mg by mouth 3 (three) times daily as needed for mild pain (take with tylenol).     LORazepam (ATIVAN) 0.5 MG tablet Take 0.5 mg by mouth 4 (four) times daily as needed for anxiety.     ondansetron (ZOFRAN ODT) 4 MG disintegrating tablet Take 1 tablet (4 mg total) by mouth See admin instructions. Dissolve 4 mg in the mouth three times a day and an additional 4 mg at bedtime as needed for nausea/vomiting 20 tablet 3   pantoprazole (PROTONIX) 40 MG tablet Take 40 mg by mouth daily.     polyethylene glycol (MIRALAX / GLYCOLAX) 17 g packet Take 17 g by mouth daily as needed for moderate constipation. 14 each 0   pyridOXINE (VITAMIN B-6) 100 MG tablet Take 100 mg by mouth daily.     TUKYSA 150 MG tablet Take 150 mg by mouth 2 (two) times daily.     valACYclovir (VALTREX) 1000 MG tablet Take 1 tablet (1,000 mg total) by mouth 2 (two) times daily. Take one tablet twice a day for one week, then daily (Patient taking differently: Take 1,000 mg by mouth daily.) 60 tablet 6   Facility-Administered Medications Ordered in Other Encounters  Medication Dose Route Frequency Provider Last Rate Last Admin    sodium chloride flush (NS) 0.9 % injection 10 mL  10 mL Intracatheter Once Magrinat, [name], MD        Musculoskeletal: Strength & Muscle Tone: within normal limits Gait & Station: normal Patient leans: N/A            Psychiatric Specialty Exam:  Presentation  General Appearance: Appropriate for Environment; Casual  Eye Contact:Good  Speech:Clear and Coherent; Normal Rate  Speech Volume:Normal  Handedness:Right   Mood and Affect  Mood:Anxious; Dysphoric; Depressed; Hopeless; Worthless  Affect:Appropriate; Depressed   Thought Process  Thought Processes:Coherent; Linear  Descriptions of Associations:Intact  Orientation:Full (Time, Place and Person)  Thought Content:Logical  History of Schizophrenia/Schizoaffective disorder:No data recorded Duration of Psychotic Symptoms:No data recorded Hallucinations:Hallucinations: None  Ideas of Reference:None  Suicidal Thoughts:Suicidal Thoughts: Yes, Active SI Active Intent and/or Plan: With Intent; Without  Plan; With Means to [place]; With Access to Means  Homicidal Thoughts:Homicidal Thoughts: No   Sensorium  Memory:Immediate Good; Recent Good; Remote Good  Judgment:Good  Insight:Fair   Executive Functions  Concentration:Fair  Attention Span:Fair  [place]   Psychomotor Activity  Psychomotor Activity:Psychomotor Activity: Normal   Assets  Assets:Communication Skills; Desire for Improvement; Financial Resources/Insurance; Housing; Leisure Time; Physical Health   Sleep  Sleep:Sleep: Fair  Physical Exam: Physical Exam Vitals and nursing note reviewed.  Constitutional:      Appearance: Normal appearance. She is normal weight.  HENT:     Head: Normocephalic.  Eyes:     Pupils: Pupils are equal, round, and reactive to light.  Skin:    General: Skin is warm and dry.  Neurological:     General: No focal deficit present.     Mental Status:  She is alert and oriented to person, place, and time. Mental status is at baseline.   ROS Blood pressure 121/80, pulse 78, temperature 97.7 F (36.5 C), temperature source Oral, resp. rate 11, last menstrual period 12/21/2016, SpO2 100 %. There is no height or weight on file to calculate BMI.  Treatment Plan Summary: Plan   MDD severe: Patient presents with worsening depressive symptoms to include anhedonia, recurrent thoughts of death, hopelessness,worthlessness, suicidal thoughts and ideations with a plan to overdose on meds.  -Patient will benefit from inpatient psychiatric admission for crisis stabilization, medication management and therapy. Will initiate contact with [facility].  Disposition: Recommend psychiatric Inpatient admission when medically cleared.  Suella Broad, FNP 05/01/2021 3:02 PM

## 2021-05-01 NOTE — Progress Notes (Signed)
COURTESY NOTE:  Monica [name] presented to the emergency room today complaining of extreme fatigue.  She expressed suicidal ideation.  She is being admitted to behavioral health.  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  This is not curable.  However it is treatable and she is in many ways greatly improved as compared to 5 months ago.  At that time she had uncontrolled pain, uncontrolled nausea and vomiting, and confusion.  Currently her pain is well controlled on her current medications which do not include narcotics, she denies nausea or vomiting and is not taking antiemetics.  She continues to have flatness of affect, difficulty concentrating, inability to see very clearly secondary to her brain involvement and his prior treatment, and a very poor functional status.  She is also very isolated.  Her son is in [place] in the [employer] and to my surprise has not been released for visit even though his mother's situation is so dire.  From an oncology point of view [name] is receiving tucatinib to prevent growth or recurrence of her brain metastases this medication should be continued (150 mg twice daily.  She also receives Enhertu infusions every 21 days, with her next infusion due 05/14/2021.  I am including a full summary of her breast cancer situation below for your reference.  Please let me know if we can be of further help  SUMMARY:  50 y.o.  [place], [place] woman status post right breast upper inner quadrant biopsy 12/29/2017, for a clinical T1c N0, stage IA invasive ductal carcinoma, triple positive, with an MIB-1 of 80%.   (1) genetics testing 02/02/2018 though the CancerNext gene panel offered by [facility] showed no deleterious mutations in  APC, ATM, BARD1, BMPR1A,BRCA1, BRCA2, BRIP1, CDH1, CDK4, CDKN2A, CHEK2, DICER1, HOXB13, MLH1, MRE11A, MSH2, MSH6, MUTYH, NBN, NF1, PALB2, PMS2, POLD1, POLE, PTEN, RAD50, RAD51C, RAD51D, SMAD4, SMARCA4, STK11 and TP53 (sequencing and deletion/duplication);  EPCAM and GREM1 (deletion/duplication only).             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               (a) Pertuzumab omitted after cycle 1 due to diarrhea.             (b) Gemcitabine substituted for docetaxel starting with cycle 5 due to peripheral neuropathy   (4) continued trastuzumab to total 6 months (through February 2020).             (a) echocardiogram 01/21/2018 showed an ejection fraction in the 55-60% range             (b) repeat echocardiogram 06/14/2018 showed an ejection fraction in the 60-65%   (5) adjuvant radiation 08/15/2018 - 09/28/2018  Site/dose:   The patient initially received a dose of 50.4 Gy in 28 fractions to the right breast using whole-breast tangent fields. This was delivered using a 3-D conformal technique. The patient then received a boost to the seroma. This delivered an additional 10 Gy in 5 fractions using 12E, 9E electrons with a   special teletherapy technique. The total dose was 60.4 Gy.    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, involving brain, liver, lungs, and bones (7) presenting 11/29/2020 with progressive back pain:             (a) non-contrast CT abd/pelvis (renal stone study) 11/29/2020 shows bone metastases             (b) CT chest/abd/pelvis with contrast 11/30/2020 shows multiple  large liver masses, multiple pulmonary nodules, and widespread lytic bone lesions             (c) MRI brain shows at least 4 brain lesions, largest 3.8 cm             (d) total spinal MRI 11/30/2020 shows pathologic fractures at T9, T12, possibly L2, as well as numerous other spine lesions, but no epidural enhancement or cord compression             (e) liver biopsy 12/02/2020 shows adenocarcinoma, prognostic panel again triple positive             (f) pre-op repeat MRI 12/01/2020 shows additional brain lesions, at least 8 of which will be targets for SRS   (8) CNS treatment:             (a) SRS 12/05/2020             (b) surgery 12/06/2020 confirmed metastatic carcinoma, estrogen receptor positive, HER2 amplified, progesterone receptor negative, with an MIB-1 of 70%.             (c) brain MRI 03/06/2021 shows minimal enhancement along the resection margin, previously noted lesions stable or decreased in size, some no longer seen, and no new brain lesions   (9) XRT to spine 12/05/2020 through 12/23/2020 Site Technique Total Dose (Gy) Dose per Fx (Gy) Completed Fx Beam Energies  Thoracic Spine: Spine_T9-L3 Complex 30/30 3 10/10 15X  Pelvis: Pelvis_sacrum Complex 30/30 3 10/10 15X    (10) advanced directives-- plans to name son as HCPOA with her father as secondary   (11) started capecitabine 01/08/2021, trastuzumab 01/20/2021, tucatinib 02/10/2021             (a) echo 01/14/2021 shows an ejection fraction in the 60-65% range.             (b) capecitabine and trastuzumab discontinued 04/15/2021 with evidence of progression             (c) tucatinib continued   (12) started denosumab/Xgeva 02/10/2021, repeated every 6 weeks             (a) received one dose pamidronate in hospital 01/16/2021   (13) pain management:             (a) Tylenol 500 mg and Advil 400 mg 3 times a day w as needed             (b) gabapentin 3 times a day as needed             (c) tramadol 50 mg 4 times a day as needed               (  d) bowel prophylaxis: MiraLAX daily, stool softeners 2 tablets twice daily as needed   (14) status post T9 and T10 kyphoplasty 03/07/2021   (15) Enhertu started 04/23/2021

## 2021-05-02 ENCOUNTER — Encounter (HOSPITAL_COMMUNITY): Payer: Self-pay

## 2021-05-02 DIAGNOSIS — F332 Major depressive disorder, recurrent severe without psychotic features: Secondary | ICD-10-CM | POA: Diagnosis not present

## 2021-05-02 DIAGNOSIS — R45851 Suicidal ideations: Secondary | ICD-10-CM

## 2021-05-02 DIAGNOSIS — F329 Major depressive disorder, single episode, unspecified: Secondary | ICD-10-CM | POA: Diagnosis present

## 2021-05-02 LAB — COMPREHENSIVE METABOLIC PANEL
ALT: 49 U/L — ABNORMAL HIGH (ref 0–44)
AST: 31 U/L (ref 15–41)
Albumin: 3.6 g/dL (ref 3.5–5.0)
Alkaline Phosphatase: 141 U/L — ABNORMAL HIGH (ref 38–126)
Anion gap: 9 (ref 5–15)
BUN: 20 mg/dL (ref 6–20)
CO2: 20 mmol/L — ABNORMAL LOW (ref 22–32)
Calcium: 7.5 mg/dL — ABNORMAL LOW (ref 8.9–10.3)
Chloride: 106 mmol/L (ref 98–111)
Creatinine, Ser: 0.66 mg/dL (ref 0.44–1.00)
GFR, Estimated: >60 mL/min (ref >60)
Glucose, Bld: 150 mg/dL — ABNORMAL HIGH (ref 70–99)
Potassium: 3.4 mmol/L — ABNORMAL LOW (ref 3.5–5.1)
Sodium: 135 mmol/L (ref 135–145)
Total Bilirubin: 1 mg/dL (ref 0.3–1.2)
Total Protein: 6.4 g/dL — ABNORMAL LOW (ref 6.5–8.1)

## 2021-05-02 LAB — TSH: TSH: 2.954 u[IU]/mL (ref 0.350–4.500)

## 2021-05-02 LAB — LIPID PANEL
Cholesterol: 177 mg/dL (ref 0–200)
HDL: 65 mg/dL (ref >40)
LDL Cholesterol: 73 mg/dL (ref 0–99)
Total CHOL/HDL Ratio: 2.7 RATIO
Triglycerides: 193 mg/dL — ABNORMAL HIGH (ref <150)
VLDL: 39 mg/dL (ref 0–40)

## 2021-05-02 LAB — HEMOGLOBIN A1C
Hgb A1c MFr Bld: 6.1 % — ABNORMAL HIGH (ref 4.8–5.6)
Mean Plasma Glucose: 128.37 mg/dL

## 2021-05-02 MED ORDER — HYDROXYZINE HCL 25 MG PO TABS
25.0000 mg | ORAL_TABLET | Freq: Three times a day (TID) | ORAL | Status: DC | PRN
  Administered 2021-05-02 (×2): 25 mg via ORAL
  Filled 2021-05-02 (×2): qty 1

## 2021-05-02 MED ORDER — PANTOPRAZOLE SODIUM 40 MG PO TBEC
40.0000 mg | DELAYED_RELEASE_TABLET | Freq: Every day | ORAL | Status: DC
  Administered 2021-05-02 – 2021-05-03 (×2): 40 mg via ORAL
  Filled 2021-05-02 (×5): qty 1

## 2021-05-02 MED ORDER — ENSURE ENLIVE PO LIQD
237.0000 mL | Freq: Three times a day (TID) | ORAL | Status: DC
  Administered 2021-05-02 – 2021-05-03 (×2): 237 mL via ORAL
  Filled 2021-05-02 (×10): qty 237

## 2021-05-02 MED ORDER — GABAPENTIN 300 MG PO CAPS
300.0000 mg | ORAL_CAPSULE | Freq: Three times a day (TID) | ORAL | Status: DC
  Administered 2021-05-02 – 2021-05-03 (×5): 300 mg via ORAL
  Filled 2021-05-02 (×11): qty 1

## 2021-05-02 MED ORDER — VITAMIN B-6 100 MG PO TABS
100.0000 mg | ORAL_TABLET | Freq: Every day | ORAL | Status: DC
  Administered 2021-05-02 – 2021-05-03 (×2): 100 mg via ORAL
  Filled 2021-05-02 (×4): qty 1

## 2021-05-02 MED ORDER — ACETAMINOPHEN 325 MG PO TABS
650.0000 mg | ORAL_TABLET | Freq: Four times a day (QID) | ORAL | Status: DC | PRN
  Administered 2021-05-02: 650 mg via ORAL
  Filled 2021-05-02: qty 2

## 2021-05-02 MED ORDER — FERROUS SULFATE 325 (65 FE) MG PO TABS
325.0000 mg | ORAL_TABLET | Freq: Every day | ORAL | Status: DC
  Administered 2021-05-02 – 2021-05-03 (×2): 325 mg via ORAL
  Filled 2021-05-02 (×5): qty 1

## 2021-05-02 MED ORDER — TUCATINIB 150 MG PO TABS
150.0000 mg | ORAL_TABLET | Freq: Two times a day (BID) | ORAL | Status: DC
  Administered 2021-05-02 – 2021-05-03 (×3): 150 mg via ORAL

## 2021-05-02 MED ORDER — MAGNESIUM HYDROXIDE 400 MG/5ML PO SUSP: 30.0000 mL | Freq: Every day | ORAL | Status: DC | PRN

## 2021-05-02 MED ORDER — DULOXETINE HCL 20 MG PO CPEP
20.0000 mg | ORAL_CAPSULE | Freq: Every day | ORAL | Status: DC
  Administered 2021-05-02: 20 mg via ORAL
  Filled 2021-05-02 (×4): qty 1

## 2021-05-02 MED ORDER — MIRTAZAPINE 7.5 MG PO TABS
7.5000 mg | ORAL_TABLET | Freq: Every day | ORAL | Status: DC
  Administered 2021-05-02: 7.5 mg via ORAL
  Filled 2021-05-02 (×4): qty 1

## 2021-05-02 MED ORDER — POLYETHYLENE GLYCOL 3350 17 G PO PACK
17.0000 g | PACK | Freq: Every day | ORAL | Status: DC
  Filled 2021-05-02 (×3): qty 1

## 2021-05-02 MED ORDER — TRAZODONE HCL 50 MG PO TABS
50.0000 mg | ORAL_TABLET | Freq: Every evening | ORAL | Status: DC | PRN
  Administered 2021-05-02: 50 mg via ORAL
  Filled 2021-05-02: qty 1

## 2021-05-02 MED ORDER — IBUPROFEN 400 MG PO TABS
400.0000 mg | ORAL_TABLET | Freq: Four times a day (QID) | ORAL | Status: DC | PRN
  Administered 2021-05-02 (×2): 400 mg via ORAL
  Filled 2021-05-02 (×2): qty 1

## 2021-05-02 MED ORDER — SERTRALINE HCL 25 MG PO TABS
25.0000 mg | ORAL_TABLET | Freq: Every day | ORAL | Status: DC
  Administered 2021-05-03: 25 mg via ORAL
  Filled 2021-05-02 (×3): qty 1

## 2021-05-02 MED ORDER — ALUM & MAG HYDROXIDE-SIMETH 200-200-20 MG/5ML PO SUSP: 30.0000 mL | ORAL | Status: DC | PRN

## 2021-05-02 MED ORDER — VALACYCLOVIR HCL 500 MG PO TABS
1000.0000 mg | ORAL_TABLET | Freq: Every day | ORAL | Status: DC
  Administered 2021-05-02 – 2021-05-03 (×2): 1000 mg via ORAL
  Filled 2021-05-02 (×5): qty 2

## 2021-05-02 MED ORDER — TRAMADOL HCL 50 MG PO TABS: 50.0000 mg | ORAL_TABLET | Freq: Four times a day (QID) | ORAL | Status: DC | PRN

## 2021-05-02 NOTE — [facility] Group Notes (Signed)
Pt attended goal setting group

## 2021-05-02 NOTE — H&P (Addendum)
Psychiatric Admission Assessment Adult  Patient Identification: Monica [name] MRN:  [id number] Date of Evaluation:  05/02/2021 Chief Complaint:  MDD (major depressive disorder), recurrent episode, severe ([place]) [F33.2] Principal Diagnosis: MDD (major depressive disorder), single episode with melancholic features Diagnosis:  Principal Problem:   MDD (major depressive disorder), single episode with melancholic features Active Problems:   Malignant neoplasm of upper-inner quadrant of right breast in female, estrogen receptor positive ([place])  History of Present Illness: Monica [name] is a 50 year old female with no prior psychiatric history and a medical history of Stage IV, metastatic triple positive breast cancer admitted voluntarily for worsening depression and SI.   On assessment today, Monica [name] endorses that over the past couple of months, since the recurrence and metastatic nature of her breast cancer, she has felt down, depressed, hopeless, lonely and without reason to live.She endorses being in pain 2/2 mets, s/p R hip replacement which has made ambulating difficult, and increased difficulty in completing ADLs. She reports that she no longer wants interventions, as she feels as though she no longer has control of her life nor her care; she had the plan to end her life with pills. She is, however, able to contract for safety on the unit.  She endorses SI, but denies HI/AVH, paranoia and other first-rank sx, and does not voice delusions nor appears to be internally preoccupied. Of her depression, along with the aforementioned sx, Monica [name] also endorses poor sleep (nighttime insomnia and daytime hypersomnolence), anhedonia, difficulty concentrating, and poor appetite. She denies manic/hypomanic sx, sx of PTSD, and anxiety. Additional somatic sx include painful mouth sores likely 2/2 steroid use, xerostomia, and ongoing GI discomfort. Monica [name] insists that she does not want to increase the Cymbalta dose, and  although initially hesitant, she is open to medication changes.    Past Psychiatric Hx: Previous Psych Diagnoses: N/A Prior inpatient treatment:N/A Current/prior outpatient treatment:N/A Prior rehab hx: N/A Psychotherapy hx: Saw a therapist for 2-3 sessions, but did not mesh well with their personality History of suicide: N/A History of homicide: N/A Psychiatric medication history: Cymbalta for nerve pain, otherwise N/A Psychiatric medication compliance history: N/A Current Psychiatrist: N/A Current therapist: N/A  Substance Abuse Hx: Alcohol: Denies use Tobacco:Denies use Illicit drugs: Denies use Rx drug abuse: Denies use Rehab hx: Denies  Past Medical History: Medical Diagnoses: See below Home Rx: See below, as well as "courtesy note" from oncologist 10/20 Prior Surgeries/Trauma: See below Head trauma, LOC, concussions, seizures: Denies Allergies: NKDA Oncologist: Lurline Del, MD  Family History: Medical:N/A Psych: Sister- MDD, OCD Psych Rx: Unsure SA/HA: N/A Substance use family hx: N/A   Social History: 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 The patient is separated from her husband, Monica [name]. Current partner lives in [place]. The patient's son Monica [name] is in the [employer] and is stationed in [place] (for 3 years beginning January 2020). He plans on working in the railroad industry after he returns. The patient's daughter Monica [name], age 32, works at Engineer, maintenance.  Associated Signs/Symptoms: Depression Symptoms:  depressed mood, anhedonia, insomnia, hypersomnia, psychomotor retardation, fatigue, difficulty concentrating, hopelessness, Duration of Depression Symptoms: No data recorded (Hypo) Manic Symptoms:  N/A Anxiety Symptoms:   N/A Psychotic Symptoms:   N/A PTSD Symptoms: N/A  Total Time spent with patient: I personally spent 45 minutes on the unit in direct patient care. The direct patient care time included  face-to-face time with the patient, reviewing the patient's chart, communicating with other professionals, and coordinating care. Greater than  50% of this time was spent in counseling or coordinating care with the patient regarding goals of hospitalization, psycho-education, and discharge planning needs.    Is the patient at risk to self? Yes.    Has the patient been a risk to self in the past 6 months? No.  Has the patient been a risk to self within the distant past? No.  Is the patient a risk to others? No.  Has the patient been a risk to others in the past 6 months? No.  Has the patient been a risk to others within the distant past? No.   Alcohol Screening: Patient refused Alcohol Screening Tool: Yes 1. How often do you have a drink containing alcohol?: Monthly or less 2. How many drinks containing alcohol do you have on a typical day when you are drinking?: 1 or 2 3. How often do you have six or more drinks on one occasion?: Never AUDIT-C Score: 1 Substance Abuse History in the last 12 months:  No. Consequences of Substance Abuse: Negative Previous Psychotropic Medications: No  Psychological Evaluations: No  Past Medical History:  Past Medical History:  Diagnosis Date   Arthritis    lower back   Cancer ([place])    right breast cancer   Family history of breast cancer    Personal history of chemotherapy    Personal history of radiation therapy     Past Surgical History:  Procedure Laterality Date   ABDOMINAL HYSTERECTOMY     APPLICATION OF CRANIAL NAVIGATION N/A 12/06/2020   Procedure: APPLICATION OF CRANIAL NAVIGATION;  Surgeon: Judith Part, MD;  Location: [facility];  Service: Neurosurgery;  Laterality: N/A;  RM 20   BREAST LUMPECTOMY Right    BREAST LUMPECTOMY WITH RADIOACTIVE SEED AND SENTINEL LYMPH NODE BIOPSY Right 02/16/2018   Procedure: RIGHT BREAST LUMPECTOMY WITH RADIOACTIVE SEED AND SENTINEL LYMPH NODE BIOPSY;  Surgeon: [name], MD;  Location: [facility];  Service: General;  Laterality: Right;   CRANIOTOMY Right 12/06/2020   Procedure: Right sided awake craniotomy for tumor resection;  Surgeon: Judith Part, MD;  Location: [facility];  Service: Neurosurgery;  Laterality: Right;   HYMENECTOMY     IR IMAGING GUIDED PORT INSERTION  03/12/2021   IR US GUIDE BX ASP/DRAIN  12/02/2020   KYPHOPLASTY N/A 03/07/2021   Procedure: Thoracic nine, Thoracic ten KYPHOPLASTY;  Surgeon: Judith Part, MD;  Location: [facility];  Service: Neurosurgery;  Laterality: N/A;   PORTACATH PLACEMENT Right 02/16/2018   Procedure: INSERTION PORT-A-CATH;  Surgeon: [name], MD;  Location: [facility];  Service: General;  Laterality: Right;   RE-EXCISION OF BREAST LUMPECTOMY Right 02/22/2018   Procedure: RE-EXCISION OF RIGHT  BREAST LUMPECTOMY;  Surgeon: [name], MD;  Location: [facility];  Service: General;  Laterality: Right;   REPAIR VAGINAL CUFF N/A 02/07/2017   Procedure: REPAIR VAGINAL CUFF;  Surgeon: Ena Dawley, MD;  Location: [facility];  Service: Gynecology;  Laterality: N/A;   ROBOTIC ASSISTED TOTAL HYSTERECTOMY WITH SALPINGECTOMY Left 01/20/2017   Procedure: ROBOTIC ASSISTED TOTAL HYSTERECTOMY WITH SALPINGECTOMY;  Surgeon: [name], MD;  Location: [facility];  Service: Gynecology;  Laterality: Left;   TOTAL HIP ARTHROPLASTY Right 2022   total hip and partial femur, per pt   Family History:  Family History  Problem Relation Age of Onset   Lung cancer Maternal Grandfather    Esophageal cancer Paternal Grandfather 27   Breast cancer Cousin 31   Tobacco Screening:  Denies use Social History:  Social History   Substance and Sexual Activity  Alcohol Use Not Currently     Social History   Substance and Sexual Activity  Drug Use No    Additional Social History:         Allergies:  No Known Allergies Lab Results:  Results for orders placed or performed during the hospital encounter of  05/01/21 (from the past 48 hour(s))  Hemoglobin A1c     Status: Abnormal   Collection Time: 05/02/21  6:31 AM  Result Value Ref Range   Hgb A1c MFr Bld 6.1 (H) 4.8 - 5.6 %    Comment: (NOTE) Pre diabetes:          5.7%-6.4%  Diabetes:              >6.4%  Glycemic control for   <7.0% adults with diabetes    Mean Plasma Glucose 128.37 mg/dL    Comment: Performed at [facility], [place] [address], [place], [place] [address]  Lipid panel     Status: Abnormal   Collection Time: 05/02/21  6:31 AM  Result Value Ref Range   Cholesterol 177 0 - 200 mg/dL   Triglycerides 193 (H) <150 mg/dL   HDL 65 >40 mg/dL   Total CHOL/HDL Ratio 2.7 RATIO   VLDL 39 0 - 40 mg/dL   LDL Cholesterol 73 0 - 99 mg/dL    Comment:        Total Cholesterol/HDL:CHD Risk Coronary Heart Disease Risk Table                     Men   Women  1/2 Average Risk   3.4   3.3  Average Risk       5.0   4.4  2 X Average Risk   9.6   7.1  3 X Average Risk  23.4   11.0        Use the calculated Patient Ratio above and the CHD Risk Table to determine the patient's CHD Risk.        ATP III CLASSIFICATION (LDL):  <100     mg/dL   Optimal  100-129  mg/dL   Near or Above                    Optimal  130-159  mg/dL   Borderline  160-189  mg/dL   High  >190     mg/dL   Very High Performed at [facility] [address] [place], [place] [address]   TSH     Status: None   Collection Time: 05/02/21  6:31 AM  Result Value Ref Range   TSH 2.954 0.350 - 4.500 uIU/mL    Comment: Performed by a 3rd Generation assay with a functional sensitivity of <=0.01 uIU/mL. Performed at [facility], [place] [address] [place], [place] [address]   Comprehensive metabolic panel     Status: Abnormal   Collection Time: 05/02/21  6:48 PM  Result Value Ref Range   Sodium 135 135 - 145 mmol/L   Potassium 3.4 (L) 3.5 - 5.1 mmol/L   Chloride 106 98 - 111 mmol/L   CO2 20 (L) 22 - 32 mmol/L   Glucose,  Bld 150 (H) 70 - 99 mg/dL    Comment: Glucose reference range applies only to samples taken after fasting for at least 8 hours.   BUN 20 6 - 20 mg/dL  Creatinine, Ser 0.66 0.44 - 1.00 mg/dL   Calcium 7.5 (L) 8.9 - 10.3 mg/dL   Total Protein 6.4 (L) 6.5 - 8.1 g/dL   Albumin 3.6 3.5 - 5.0 g/dL   AST 31 15 - 41 U/L   ALT 49 (H) 0 - 44 U/L   Alkaline Phosphatase 141 (H) 38 - 126 U/L   Total Bilirubin 1.0 0.3 - 1.2 mg/dL   GFR, Estimated >60 >60 mL/min    Comment: (NOTE) Calculated using the CKD-EPI Creatinine Equation (2021)    Anion gap 9 5 - 15    Comment: Performed at [facility], [place] [address] [place],  [address]    Blood Alcohol level:  Lab Results  Component Value Date   ETH <10 16/01/3709    Metabolic Disorder Labs:  Lab Results  Component Value Date   HGBA1C 6.1 (H) 05/02/2021   MPG 128.37 05/02/2021   No results found for: PROLACTIN Lab Results  Component Value Date   CHOL 177 05/02/2021   TRIG 193 (H) 05/02/2021   HDL 65 05/02/2021   CHOLHDL 2.7 05/02/2021   VLDL 39 05/02/2021   LDLCALC 73 05/02/2021    Current Medications: Current Facility-Administered Medications  Medication Dose Route Frequency Provider Last Rate Last Admin   acetaminophen (TYLENOL) tablet 650 mg  650 mg Oral Q6H PRN Nwoko, Uchenna E, PA   650 mg at 05/02/21 1134   alum & mag hydroxide-simeth (MAALOX/MYLANTA) 200-200-20 MG/5ML suspension 30 mL  30 mL Oral Q4H PRN Nwoko, Uchenna E, PA       feeding supplement (ENSURE ENLIVE / ENSURE PLUS) liquid 237 mL  237 mL Oral TID BM Lindon Romp A, NP   237 mL at 05/02/21 1349   ferrous sulfate tablet 325 mg  325 mg Oral Q breakfast Prescilla Sours, PA-C   325 mg at 05/02/21 0844   gabapentin (NEURONTIN) capsule 300 mg  300 mg Oral TID Prescilla Sours, PA-C   300 mg at 05/02/21 1721   hydrOXYzine (ATARAX/VISTARIL) tablet 25 mg  25 mg Oral TID PRN Ileene Musa E, PA   25 mg at 05/02/21 0023   ibuprofen (ADVIL) tablet 400 mg   400 mg Oral Q6H PRN Janine Limbo, MD   400 mg at 05/02/21 1133   magnesium hydroxide (MILK OF MAGNESIA) suspension 30 mL  30 mL Oral Daily PRN Nwoko, Uchenna E, PA       mirtazapine (REMERON) tablet 7.5 mg  7.5 mg Oral QHS Rosezetta Schlatter, MD       pantoprazole (PROTONIX) EC tablet 40 mg  40 mg Oral Daily Margorie John W, PA-C   40 mg at 05/02/21 0844   pyridOXINE (VITAMIN B-6) tablet 100 mg  100 mg Oral Daily Margorie John W, PA-C   100 mg at 05/02/21 1322   [START ON 05/03/2021] sertraline (ZOLOFT) tablet 25 mg  25 mg Oral Daily Rosezetta Schlatter, MD       tucatinib (TUKYSA) tablet 150 mg  150 mg Oral BID Margorie John W, PA-C   150 mg at 05/02/21 6269   valACYclovir (VALTREX) tablet 1,000 mg  1,000 mg Oral Daily Margorie John W, PA-C   1,000 mg at 05/02/21 4854   Facility-Administered Medications Ordered in Other Encounters  Medication Dose Route Frequency Provider Last Rate Last Admin   sodium chloride flush (NS) 0.9 % injection 10 mL  10 mL Intracatheter Once Magrinat, [name], MD       PTA Medications: Medications Prior  to Admission  Medication Sig Dispense Refill Last Dose   acetaminophen (TYLENOL) 500 MG tablet Take 1 tablet (500 mg total) by mouth 3 (three) times daily as needed for mild pain (or Fever >/= 101). Take with ibuprofen 400 mg      b complex vitamins capsule Take 1 capsule by mouth daily. 900 mg      capecitabine (XELODA) 500 MG tablet Take 1,500 mg by mouth 2 (two) times daily. Takes daily for 21 days, then 7 days off, then repeat cycle      Cholecalciferol (VITAMIN D) 125 MCG (5000 UT) CAPS Take 5,000 Units by mouth daily.      dexamethasone (DECADRON) 4 MG tablet Take 2 tablets (8 mg total) by mouth daily. Start the day after chemotherapy for 2 days. 30 tablet 1    DULoxetine (CYMBALTA) 20 MG capsule TAKE 1 CAPSULE BY MOUTH EVERY DAY (Patient taking differently: Take 20 mg by mouth daily.) 90 capsule 2    ferrous sulfate 325 (65 FE) MG tablet Take 325 mg by mouth daily  with breakfast.      fluconazole (DIFLUCAN) 100 MG tablet TAKE 1 TABLET BY MOUTH EVERY DAY (Patient taking differently: Take 100 mg by mouth daily.) 30 tablet 1    gabapentin (NEURONTIN) 300 MG capsule Take 1 capsule (300 mg total) by mouth 3 (three) times daily. 90 capsule 6    ibuprofen (ADVIL) 200 MG tablet Take 400 mg by mouth 3 (three) times daily as needed for mild pain (take with tylenol).      LORazepam (ATIVAN) 0.5 MG tablet Take 0.5 mg by mouth 4 (four) times daily as needed for anxiety.      ondansetron (ZOFRAN ODT) 4 MG disintegrating tablet Take 1 tablet (4 mg total) by mouth See admin instructions. Dissolve 4 mg in the mouth three times a day and an additional 4 mg at bedtime as needed for nausea/vomiting 20 tablet 3    pantoprazole (PROTONIX) 40 MG tablet Take 40 mg by mouth daily.      polyethylene glycol (MIRALAX / GLYCOLAX) 17 g packet Take 17 g by mouth daily as needed for moderate constipation. 14 each 0    pyridOXINE (VITAMIN B-6) 100 MG tablet Take 100 mg by mouth daily.      TUKYSA 150 MG tablet Take 150 mg by mouth 2 (two) times daily.      valACYclovir (VALTREX) 1000 MG tablet Take 1 tablet (1,000 mg total) by mouth 2 (two) times daily. Take one tablet twice a day for one week, then daily (Patient taking differently: Take 1,000 mg by mouth daily.) 60 tablet 6     Musculoskeletal: Strength & Muscle Tone: decreased Gait & Station: unsteady, 2/2 pain s/p R hip replacement Patient leans: Left      Psychiatric Specialty Exam:  Presentation  General Appearance: Appropriate for Environment; Casual; Fairly Groomed  Eye Contact:Good  Speech:Clear and Coherent; Normal Rate  Speech Volume:Normal  Handedness:Right   Mood and Affect  Mood:Anxious; Depressed; Dysphoric  Affect:Flat; Congruent   Thought Process  Thought Processes:Coherent; Linear  Duration of Psychotic Symptoms: No data recorded Past Diagnosis of Schizophrenia or Psychoactive disorder: No data  recorded Descriptions of Associations:Intact  Orientation:Full (Time, Place and Person)  Thought Content:Logical  Hallucinations:Hallucinations: None  Ideas of Reference:None  Suicidal Thoughts:Suicidal Thoughts: Yes, Passive SI Active Intent and/or Plan: Without Plan; Without Intent  Homicidal Thoughts:Homicidal Thoughts: No   Sensorium  Memory:Immediate Good; Recent Good; Remote Good  Judgment:Fair  Insight:Fair  Executive Functions  Concentration:Poor  Attention Span:Fair  [place]   Psychomotor Activity  Psychomotor Activity:Psychomotor Activity: Normal   Assets  Assets:Communication Skills; Desire for Improvement; Financial Resources/Insurance; Housing; Leisure Time; Physical Health   Sleep  Sleep:Sleep: Poor    Physical Exam: Physical Exam Vitals and nursing note reviewed.  Constitutional:      General: She is not in acute distress.    Appearance: Normal appearance. She is ill-appearing. She is not toxic-appearing.     Comments: Chronically-ill appearing  HENT:     Head: Normocephalic.     Comments: Shaved head 2/2 chemo and brain surgery    Mouth/Throat:     Mouth: Mucous membranes are dry.     Pharynx: Oropharynx is clear.  Pulmonary:     Effort: Pulmonary effort is normal.  Skin:    General: Skin is warm and dry.  Neurological:     General: No focal deficit present.     Mental Status: She is alert and oriented to person, place, and time.     Motor: Weakness present.     Gait: Gait abnormal.     Comments: Gait limited by pain   Review of Systems  Constitutional:  Positive for malaise/fatigue and weight loss.  HENT:         Painful mouth sores  Eyes:        Worsening vision  Respiratory:  Negative for shortness of breath.   Gastrointestinal:  Negative for nausea and vomiting.       Abdominal discomfort 2/2 medications  Musculoskeletal:  Positive for falls, joint pain and myalgias.   Neurological:  Positive for weakness and headaches. Negative for tremors and seizures.  Blood pressure (!) 132/93, pulse (!) 107, temperature 97.9 F (36.6 C), temperature source Oral, resp. rate 18, height 5' 4.17" (1.63 m), weight 67.6 kg, last menstrual period 12/21/2016, SpO2 100 %. Body mass index is 25.44 kg/m.  Treatment Plan Summary: Daily contact with patient to assess and evaluate symptoms and progress in treatment and Medication management  Observation Level/Precautions:  Continuous Observation Fall 15 minute checks  Laboratory:  As below  Psychotherapy:  Supportive and group  Medications:  As below  Consultations:  N/A, oncology following  Discharge Concerns:  N/A  Estimated LOS: 5-7 days   Other:  N/A   MDD-Single episode w/ melancholic features  Suicidal ideation Poor appetite -Per patient preference, home Duloxetine discontinued -Zoloft 25 mg to be initiated tomorrow AM for MDD -Remeron 7.5 mg initiated tonight for depression, insomnia, and poor appetite.  (r/b/se/a to medications reviewed and she consents to med trial)- 3 drug interaction checkers assessed for potential rxn b/w Zoloft and Tucatinib (no interactions) and Remeron and Tucatinib (Tucatinib is a CYP3A4 inhibitor which may increase the serum amount of mirtazapine. -Initiate Ensure supplements with each meal -Continue home Neurontin 300 mg TID  Medical Management Covid negative CMP: K+ 3.4, Ca2+ 7.5, ALT 49, Alk Phos 141 CBC: unremarkable EtOH: <10 UDS: To be collected TSH: 2.954 A1C: 6.1% Lipids:  TG 193  Metastatic breast Cancer, triple positive Mouth sores 2/2 steroid tx -Continue home Tucatinib 150 mg BID -Continue home Valtrex 1 g daily -Continue Pyridoxine 100 mg daily -Initiate bowel regimen- Miralax 17 g daily   GERD -Continue home Protonix 40 mg   Physician Treatment Plan for Primary Diagnosis: MDD (major depressive disorder), single episode with melancholic features Long Term  Goal(s): Improvement in symptoms so as ready for discharge  Short Term  Goals: Ability to identify changes in lifestyle to reduce recurrence of condition will improve, Ability to verbalize feelings will improve, Ability to disclose and discuss suicidal ideas, Ability to demonstrate self-control will improve, Ability to identify and develop effective coping behaviors will improve, and Compliance with prescribed medications will improve  Physician Treatment Plan for Secondary Diagnosis: Principal Problem:   MDD (major depressive disorder), single episode with melancholic features Active Problems:   Malignant neoplasm of upper-inner quadrant of right breast in female, estrogen receptor positive ([place])  Long Term Goal(s): Improvement in symptoms so as ready for discharge  Short Term Goals: Ability to identify changes in lifestyle to reduce recurrence of condition will improve, Ability to verbalize feelings will improve, Ability to disclose and discuss suicidal ideas, Ability to identify and develop effective coping behaviors will improve, and Compliance with prescribed medications will improve  I certify that inpatient services furnished can reasonably be expected to improve the patient's condition.    Rosezetta Schlatter, MD 10/21/20228:21 PM

## 2021-05-02 NOTE — Progress Notes (Signed)
I appreciate Behavioral Health's help to this patient!  Taler's legs are weak and she feels she cannot get out of bed. If that is the case she cannot be home by herself and I would recommend evaluation by [facility].  Please let us know if we can assist  Lurline Del MD

## 2021-05-02 NOTE — Progress Notes (Addendum)
[facility] MD Progress Note  05/03/2021 7:40 AM Monica [name]  MRN:  [id number] Subjective:  Monica [name] is a 50 year old female with no prior psychiatric history and a medical history of Stage IV, metastatic triple positive breast cancer admitted voluntarily for worsening depression and SI in the context of the recurrence and metastatic nature of cancer. Patient has not felt ready for outpatient palliative care as of 10/10, per oncology team.   On Assessment Today (10/22): Case was discussed in the multidisciplinary team. MAR was reviewed and patient was compliant with medications. Patient seen, assessed, and discussed with attending Dr. Berdine Addison.  She reports that she is not doing well today, as she does not believe that this unit is the best environment for her.  She is unable to complete ADLs in a similar manner to other patients on the unit; she requires additional help and equipment for showering, and seating for dressing that is not present in the restroom.  She does endorse that she slept well last night with the Remeron saying it was "the best sleep I have had in a long time. I did not want to wake up, and was hoping that I'd die."  She also reports a poor appetite, limited by inability to taste and painful sores inside her cheeks and under her tongue.  She reports SI without a plan, but denies HI, AVH, first rank symptoms, ideas of reference, or paranoia on the unit.  She was advised that our team will look into other treatment options for her, since she is voluntary; she is limited by the fact that she has a minimal support system available in family or friends, which she understands.   Regarding her WBC 1.3, spoke to oncologist, Dr. Julien Nordmann who advised to obtain a differential on her; if her Stickney is <1,000 he stated that she should be sent to the ED for further workup. [place] 900, so patient transferred to [facility] for further workup.  Principal Problem: MDD (major depressive disorder), single episode with  melancholic features Diagnosis: Principal Problem:   MDD (major depressive disorder), single episode with melancholic features Active Problems:   Malignant neoplasm of upper-inner quadrant of right breast in female, estrogen receptor positive ([place])   Suicidal ideation  Total Time spent with patient: 30 minutes   Past Psychiatric Hx: See H&P  Past Medical History: Medical Diagnoses: Home Rx: Prior Hosp: Prior Surgeries/Trauma: Head trauma, LOC, concussions, seizures: Denies Allergies: NKDA Oncologist: Lurline Del, MD  Family History: Medical:N/A Psych: Sister- MDD, OCD Psych Rx: Unsure SA/HA: N/A Substance use family hx: N/A   Social History: 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 The patient is separated from her husband, Monica [name]. Current partner lives in [place]. The patient's son Monica [name] is in the [employer] and is stationed in [place] (for 3 years beginning January 2020). He plans on working in the railroad industry after he returns. The patient's daughter Monica [name], age 21, works at Engineer, maintenance.  Past Medical History:  Past Medical History:  Diagnosis Date   Arthritis    lower back   Cancer [facility])    right breast cancer   Family history of breast cancer    Personal history of chemotherapy    Personal history of radiation therapy     Past Surgical History:  Procedure Laterality Date   ABDOMINAL HYSTERECTOMY     APPLICATION OF CRANIAL NAVIGATION N/A 12/06/2020   Procedure: APPLICATION OF CRANIAL NAVIGATION;  Surgeon: Judith Part, MD;  Location: [place] [facility];  Service: Neurosurgery;  Laterality: N/A;  RM 20   BREAST LUMPECTOMY Right    BREAST LUMPECTOMY WITH RADIOACTIVE SEED AND SENTINEL LYMPH NODE BIOPSY Right 02/16/2018   Procedure: RIGHT BREAST LUMPECTOMY WITH RADIOACTIVE SEED AND SENTINEL LYMPH NODE BIOPSY;  Surgeon: [name], MD;  Location: [facility];  Service: General;  Laterality: Right;    CRANIOTOMY Right 12/06/2020   Procedure: Right sided awake craniotomy for tumor resection;  Surgeon: Judith Part, MD;  Location: [facility];  Service: Neurosurgery;  Laterality: Right;   HYMENECTOMY     IR IMAGING GUIDED PORT INSERTION  03/12/2021   IR US GUIDE BX ASP/DRAIN  12/02/2020   KYPHOPLASTY N/A 03/07/2021   Procedure: Thoracic nine, Thoracic ten KYPHOPLASTY;  Surgeon: Judith Part, MD;  Location: [facility];  Service: Neurosurgery;  Laterality: N/A;   PORTACATH PLACEMENT Right 02/16/2018   Procedure: INSERTION PORT-A-CATH;  Surgeon: [name], MD;  Location: [facility];  Service: General;  Laterality: Right;   RE-EXCISION OF BREAST LUMPECTOMY Right 02/22/2018   Procedure: RE-EXCISION OF RIGHT  BREAST LUMPECTOMY;  Surgeon: [name], MD;  Location: [facility];  Service: General;  Laterality: Right;   REPAIR VAGINAL CUFF N/A 02/07/2017   Procedure: REPAIR VAGINAL CUFF;  Surgeon: Ena Dawley, MD;  Location: [facility];  Service: Gynecology;  Laterality: N/A;   ROBOTIC ASSISTED TOTAL HYSTERECTOMY WITH SALPINGECTOMY Left 01/20/2017   Procedure: ROBOTIC ASSISTED TOTAL HYSTERECTOMY WITH SALPINGECTOMY;  Surgeon: [name], MD;  Location: [facility];  Service: Gynecology;  Laterality: Left;   TOTAL HIP ARTHROPLASTY Right 2022   total hip and partial femur, per pt   Family History:  Family History  Problem Relation Age of Onset   Lung cancer Maternal Grandfather    Esophageal cancer Paternal Grandfather 58   Breast cancer Cousin 38   Family Psychiatric  History: See H&P Social History:  Social History   Substance and Sexual Activity  Alcohol Use Not Currently     Social History   Substance and Sexual Activity  Drug Use No    Social History   Socioeconomic History   Marital status: Legally Separated    Spouse name: Not on file   Number of children: Not on file   Years of education: Not on file   Highest education level: Not on file   Occupational History   Not on file  Tobacco Use   Smoking status: Never   Smokeless tobacco: Never  Vaping Use   Vaping Use: Former  Substance and Sexual Activity   Alcohol use: Not Currently   Drug use: No   Sexual activity: Not Currently    Birth control/protection: Surgical  Other Topics Concern   Not on file  Social History Narrative   Not on file   Social Determinants of Health   Financial Resource Strain: Not on file  Food Insecurity: Not on file  Transportation Needs: Not on file  Physical Activity: Not on file  Stress: Not on file  Social Connections: Not on file   Additional Social History:      Sleep: Good  Appetite:  Poor  Current Medications: Current Facility-Administered Medications  Medication Dose Route Frequency Provider Last Rate Last Admin   acetaminophen (TYLENOL) tablet 650 mg  650 mg Oral Q6H PRN Nwoko, Uchenna E, PA   650 mg at 05/02/21 1134   alum & mag hydroxide-simeth (MAALOX/MYLANTA) 200-200-20 MG/5ML suspension 30 mL  30 mL Oral Q4H PRN  Nwoko, Terese Door, PA       feeding supplement (ENSURE ENLIVE / ENSURE PLUS) liquid 237 mL  237 mL Oral TID BM Lindon Romp A, NP   237 mL at 05/02/21 1349   ferrous sulfate tablet 325 mg  325 mg Oral Q breakfast Prescilla Sours, PA-C   325 mg at 05/02/21 0844   gabapentin (NEURONTIN) capsule 300 mg  300 mg Oral TID Prescilla Sours, PA-C   300 mg at 05/02/21 1721   hydrOXYzine (ATARAX/VISTARIL) tablet 25 mg  25 mg Oral TID PRN Ileene Musa E, PA   25 mg at 05/02/21 2032   ibuprofen (ADVIL) tablet 400 mg  400 mg Oral Q6H PRN Janine Limbo, MD   400 mg at 05/02/21 2032   magnesium hydroxide (MILK OF MAGNESIA) suspension 30 mL  30 mL Oral Daily PRN Nwoko, Uchenna E, PA       mirtazapine (REMERON) tablet 7.5 mg  7.5 mg Oral QHS Rosezetta Schlatter, MD   7.5 mg at 05/02/21 2032   pantoprazole (PROTONIX) EC tablet 40 mg  40 mg Oral Daily Margorie John W, PA-C   40 mg at 05/02/21 0844   polyethylene glycol (MIRALAX /  GLYCOLAX) packet 17 g  17 g Oral Daily Rosezetta Schlatter, MD       pyridOXINE (VITAMIN B-6) tablet 100 mg  100 mg Oral Daily Lovena Le, Cody W, PA-C   100 mg at 05/02/21 1322   sertraline (ZOLOFT) tablet 25 mg  25 mg Oral Daily Rosezetta Schlatter, MD       traMADol Veatrice Bourbon) tablet 50 mg  50 mg Oral Q6H PRN Rosezetta Schlatter, MD       tucatinib Laury Axon) tablet 150 mg  150 mg Oral BID Margorie John W, PA-C   150 mg at 05/02/21 2050   valACYclovir (VALTREX) tablet 1,000 mg  1,000 mg Oral Daily Margorie John W, PA-C   1,000 mg at 05/02/21 1601   Facility-Administered Medications Ordered in Other Encounters  Medication Dose Route Frequency Provider Last Rate Last Admin   sodium chloride flush (NS) 0.9 % injection 10 mL  10 mL Intracatheter Once Magrinat, [name], MD        Lab Results:  Results for orders placed or performed during the hospital encounter of 05/01/21 (from the past 48 hour(s))  Hemoglobin A1c     Status: Abnormal   Collection Time: 05/02/21  6:31 AM  Result Value Ref Range   Hgb A1c MFr Bld 6.1 (H) 4.8 - 5.6 %    Comment: (NOTE) Pre diabetes:          5.7%-6.4%  Diabetes:              >6.4%  Glycemic control for   <7.0% adults with diabetes    Mean Plasma Glucose 128.37 mg/dL    Comment: Performed at [facility], [place] [address] [place], [place] [address]  Lipid panel     Status: Abnormal   Collection Time: 05/02/21  6:31 AM  Result Value Ref Range   Cholesterol 177 0 - 200 mg/dL   Triglycerides 193 (H) <150 mg/dL   HDL 65 >40 mg/dL   Total CHOL/HDL Ratio 2.7 RATIO   VLDL 39 0 - 40 mg/dL   LDL Cholesterol 73 0 - 99 mg/dL    Comment:        Total Cholesterol/HDL:CHD Risk Coronary Heart Disease Risk Table  Men   Women  1/2 Average Risk   3.4   3.3  Average Risk       5.0   4.4  2 X Average Risk   9.6   7.1  3 X Average Risk  23.4   11.0        Use the calculated Patient Ratio above and the CHD Risk Table to determine the patient's CHD Risk.         ATP III CLASSIFICATION (LDL):  <100     mg/dL   Optimal  100-129  mg/dL   Near or Above                    Optimal  130-159  mg/dL   Borderline  160-189  mg/dL   High  >190     mg/dL   Very High Performed at [facility] [address], [place], [place] [address]   TSH     Status: None   Collection Time: 05/02/21  6:31 AM  Result Value Ref Range   TSH 2.954 0.350 - 4.500 uIU/mL    Comment: Performed by a 3rd Generation assay with a functional sensitivity of <=0.01 uIU/mL. Performed at [facility], [place] [address] [place], [place] [address]   Comprehensive metabolic panel     Status: Abnormal   Collection Time: 05/02/21  6:48 PM  Result Value Ref Range   Sodium 135 135 - 145 mmol/L   Potassium 3.4 (L) 3.5 - 5.1 mmol/L   Chloride 106 98 - 111 mmol/L   CO2 20 (L) 22 - 32 mmol/L   Glucose, Bld 150 (H) 70 - 99 mg/dL    Comment: Glucose reference range applies only to samples taken after fasting for at least 8 hours.   BUN 20 6 - 20 mg/dL   Creatinine, Ser 0.66 0.44 - 1.00 mg/dL   Calcium 7.5 (L) 8.9 - 10.3 mg/dL   Total Protein 6.4 (L) 6.5 - 8.1 g/dL   Albumin 3.6 3.5 - 5.0 g/dL   AST 31 15 - 41 U/L   ALT 49 (H) 0 - 44 U/L   Alkaline Phosphatase 141 (H) 38 - 126 U/L   Total Bilirubin 1.0 0.3 - 1.2 mg/dL   GFR, Estimated >60 >60 mL/min    Comment: (NOTE) Calculated using the CKD-EPI Creatinine Equation (2021)    Anion gap 9 5 - 15    Comment: Performed at [facility], [place] [address] [place], [place] [address]    Blood Alcohol level:  Lab Results  Component Value Date   ETH <10 08/65/7846    Metabolic Disorder Labs: Lab Results  Component Value Date   HGBA1C 6.1 (H) 05/02/2021   MPG 128.37 05/02/2021   No results found for: PROLACTIN Lab Results  Component Value Date   CHOL 177 05/02/2021   TRIG 193 (H) 05/02/2021   HDL 65 05/02/2021   CHOLHDL 2.7 05/02/2021   VLDL 39 05/02/2021   LDLCALC 73  05/02/2021    Physical Findings:  Musculoskeletal: Strength & Muscle Tone: decreased Gait & Station: unsteady, limited by pain s/p right hip replacement Patient leans: Left  Psychiatric Specialty Exam:  Presentation  General Appearance: Appropriate for Environment; Casual; Fairly Groomed  Eye Contact:Good  Speech:Clear and Coherent; Normal Rate  Speech Volume:Normal  Handedness:Right   Mood and Affect  Mood:Anxious; Depressed; Dysphoric  Affect:Flat; Congruent   Thought Process  Thought Processes:Coherent; Linear  Descriptions of Associations:Intact  Orientation:Full (  Time, Place and Person)  Thought Content:Logical  History of Schizophrenia/Schizoaffective disorder:No data recorded Duration of Psychotic Symptoms:No data recorded Hallucinations:Hallucinations: None  Ideas of Reference:None  Suicidal Thoughts:Suicidal Thoughts: Yes, Passive SI Active Intent and/or Plan: Without Plan; Without Intent  Homicidal Thoughts:Homicidal Thoughts: No   Sensorium  Memory:Immediate Good; Recent Good; Remote Good  Judgment:Fair  Insight:Fair   Executive Functions  Concentration:Poor  Attention Span:Fair  [place]   Psychomotor Activity  Psychomotor Activity:Psychomotor Activity: Normal   Assets  Assets:Communication Skills; Desire for Improvement; Financial Resources/Insurance; Housing; Leisure Time; Physical Health   Sleep  Sleep:Sleep: Poor    Physical Exam: Physical Exam Vitals and nursing note reviewed.  Constitutional:      General: She is not in acute distress.    Appearance: She is not toxic-appearing.  HENT:     Head: Normocephalic.     Comments: Hair cut s/p chemotherapy and brain surgery Pulmonary:     Effort: Pulmonary effort is normal.  Skin:    General: Skin is warm and dry.  Neurological:     General: No focal deficit present.     Mental Status: She is alert and oriented to  person, place, and time.     Motor: Weakness present.     Gait: Gait abnormal.     Comments: Gait limited by pain 2/2 R hip replacement   Review of Systems  Constitutional:  Positive for malaise/fatigue and weight loss. Negative for diaphoresis.  HENT:  Negative for congestion.   Eyes:        Worsening vision s/p brain surgery  Respiratory:  Negative for cough and shortness of breath.   Cardiovascular:  Negative for chest pain.  Gastrointestinal:  Negative for abdominal pain, nausea and vomiting.  Musculoskeletal:  Positive for myalgias.  Neurological:  Positive for weakness. Negative for seizures and headaches.  Blood pressure (!) 132/93, pulse (!) 107, temperature 97.9 F (36.6 C), temperature source Oral, resp. rate 18, height 5' 4.17" (1.63 m), weight 67.6 kg, last menstrual period 12/21/2016, SpO2 100 %. Body mass index is 25.44 kg/m.   Treatment Plan Summary: Daily contact with patient to assess and evaluate symptoms and progress in treatment and Medication management  MDD-Single episode w/ melancholic features  Suicidal ideation Poor appetite -Per patient preference, home Duloxetine discontinued -Zoloft 25 mg to be initiated this a.m. for MDD -Continue Remeron 7.5 mg for depression, insomnia, and poor appetite.  3 drug interaction checkers assessed for potential rxn b/w Zoloft and Tucatinib (no interactions) and Remeron and Tucatinib (Tucatinib is a CYP3A4 inhibitor which may increase the serum amount of mirtazapine). -Initiate Ensure supplements with each meal -Continue home Neurontin 300 mg TID   Medical Management Covid negative CMP: K+ 3.4, Ca2+ 7.5, ALT 49, Alk Phos 141 CBC: unremarkable EtOH: <10 UDS: To be collected TSH: 2.954 A1C: 6.1% Lipids:  TG 193   Metastatic breast Cancer, triple positive Mouth sores 2/2 steroid tx -Continue home Tucatinib 150 mg BID -Continue home Valtrex 1 g daily -Continue Pyridoxine 100 mg daily -Initiate bowel regimen- Miralax  17 g daily     GERD -Continue home Protonix 40 mg   Leukopenia WBC 1.3 (down from 2.4 on 10/20) - Differential pending - Per oncology, if ANC is less than 1000, patient will be transferred to ED.  Continue PRN's: Tylenol, Maalox, Atarax, Milk of Magnesia, Trazodone    Rosezetta Schlatter, MD 05/03/2021, 7:40 AM

## 2021-05-02 NOTE — Progress Notes (Signed)
Pt visible on the unit some this evening, pt given PRN Vistaril , Ibuprofen , and Remeron per [facility] with HS medication    05/02/21 2000  Psych Admission Type (Psych Patients Only)  Admission Status Voluntary  Psychosocial Assessment  Patient Complaints Depression  Eye Contact Brief  Facial Expression Flat  Affect Anxious;Irritable;Sad  Speech Logical/coherent  Interaction Evasive;Cautious  Motor Activity Slow;Unsteady  Appearance/Hygiene In scrubs  Behavior Characteristics Cooperative  Mood Depressed  Thought Process  Coherency WDL  Content WDL  Delusions None reported or observed  Perception WDL  Hallucination None reported or observed  Judgment Impaired  Confusion Mild  Danger to Self  Current suicidal ideation? Denies (not at the current time/ verbally)  Self-Injurious Behavior No self-injurious ideation or behavior indicators observed or expressed   Agreement Not to Harm Self Yes  Description of Agreement Verbal  Danger to Others  Danger to Others None reported or observed

## 2021-05-02 NOTE — Group Note (Signed)
LCSW Group Therapy Note  Group Date: 05/02/2021 Start Time: 1300 End Time: 1330   Type of Therapy and Topic:  Group Therapy - Healthy vs Unhealthy Coping Skills  Participation Level:  Active   Description of Group The focus of this group was to determine what unhealthy coping techniques typically are used by group members and what healthy coping techniques would be helpful in coping with various problems. Patients were guided in becoming aware of the differences between healthy and unhealthy coping techniques. Patients were asked to identify 2-3 healthy coping skills they would like to learn to use more effectively.  Therapeutic Goals Patients learned that coping is what human beings do all day long to deal with various situations in their lives Patients defined and discussed healthy vs unhealthy coping techniques Patients identified their preferred coping techniques and identified whether these were healthy or unhealthy Patients determined 2-3 healthy coping skills they would like to become more familiar with and use more often. Patients provided support and ideas to each other   Summary of Patient Progress:   Due to the acuity and complex discharge plans, group was not held. Patient was provided therapeutic worksheets and asked to meet with CSW as needed.    Therapeutic Modalities Cognitive Behavioral Therapy Motivational Interviewing  Monica [name] 05/02/2021  1:18 PM

## 2021-05-02 NOTE — [facility] Suicide Risk Assessment (Signed)
[facility] Admission Suicide Risk Assessment   Nursing information obtained from:  Patient Demographic factors:  Divorced or widowed, Living alone Current Mental Status:  Self-harm thoughts Loss Factors:  Decline in physical health Historical Factors:  NA Risk Reduction Factors:  Positive social support  Total Time spent with patient: 30 minutes Principal Problem: <principal problem not specified> Diagnosis:  Active Problems:   MDD (major depressive disorder), recurrent episode, severe (HCC)  Subjective Data:   50 year old female with no past psychiatric history, was admitted to this unit for evaluation and treatment of depression and suicidal thoughts with plan to overdose. Patient reports having depression, worsening over the last few months, due to feeling lonely, feeling overwhelmed due to cancer treatment and prognosis, and feeling loss of control.  Patient reports feeling hopeless and helpless.     Denies having previous suicide attempt.  Denies having previous psychiatric hospitalization.  Reports that she is currently taking Cymbalta 20 mg once daily, not for mood but for pain.  Patient reports profound worsening of depression and onset of suicidal thoughts within the last 3 weeks due to the aforementioned stressors.   Reports profoundly depressed mood.  Reports anhedonia.  Reports low motivation and low energy.  Reports sleeping more.  Reports little appetite.  Reports poor focus and difficulty with concentration.  Otherwise denies symptoms of mania or hypomania at this time or in the past.  Reports feeling anxious and overwhelmed.  Denies psychotic symptoms. Patient is agreeable with stopping duloxetine and starting an antidepressant that does not interfere with the metabolism or clearance of her chemotherapy medication.  We discussed increasing Cymbalta for treatment of current psychiatric symptoms, but patient reports that her sister and had taken Cymbalta in the past, and she had a very  difficult time with withdrawal symptoms, when her sister tried to decrease and stop the medication, the patient is worried about this and wants to avoid increasing Cymbalta. Patient is agreeable to starting Zoloft.  We could also add Remeron in the future.  We could also add a stimulant in the future, if anhedonia, low motivation, poor energy, and poor focus persist.      Continued Clinical Symptoms:    The "Alcohol Use Disorders Identification Test", Guidelines for Use in Primary Care, Second Edition.  World Pharmacologist [facility]). Score between 0-7:  no or low risk or alcohol related problems. Score between 8-15:  moderate risk of alcohol related problems. Score between 16-19:  high risk of alcohol related problems. Score 20 or above:  warrants further diagnostic evaluation for alcohol dependence and treatment.   CLINICAL FACTORS:   Severe Anxiety and/or Agitation Depression:   Anhedonia Hopelessness Insomnia Severe Chronic Pain   Musculoskeletal: Strength & Muscle Tone: within normal limits Gait & Station: unsteady Patient leans: Left  Psychiatric Specialty Exam:  Presentation  General Appearance: Appropriate for Environment; Casual; Fairly Groomed  Eye Contact:Good  Speech:Clear and Coherent; Normal Rate  Speech Volume:Normal  Handedness:Right   Mood and Affect  Mood:Anxious; Depressed; Dysphoric  Affect:Flat; Congruent   Thought Process  Thought Processes:Coherent; Linear  Descriptions of Associations:Intact  Orientation:Full (Time, Place and Person)  Thought Content:Logical  History of Schizophrenia/Schizoaffective disorder:No data recorded Duration of Psychotic Symptoms:No data recorded Hallucinations:Hallucinations: None  Ideas of Reference:None  Suicidal Thoughts:Suicidal Thoughts: Yes, Passive SI Active Intent and/or Plan: Without Plan; Without Intent  Homicidal Thoughts:Homicidal Thoughts: No   Sensorium  Memory:Immediate Good;  Recent Good; Remote Good  Judgment:Fair  Insight:Fair   Executive Functions  Concentration:Poor  Attention  Span:Fair  [place]   Psychomotor Activity  Psychomotor Activity:Psychomotor Activity: Normal   Assets  Assets:Communication Skills; Desire for Improvement; Financial Resources/Insurance; Housing; Leisure Time; Physical Health   Sleep  Sleep:Sleep: Poor    Physical Exam: Physical Exam ROS Blood pressure (!) 107/94, pulse (!) 105, temperature 98.7 F (37.1 C), temperature source Oral, resp. rate 18, height 5' 4.17" (1.63 m), weight 67.6 kg, last menstrual period 12/21/2016, SpO2 100 %. Body mass index is 25.44 kg/m.   COGNITIVE FEATURES THAT CONTRIBUTE TO RISK:  None    SUICIDE RISK:   Moderate:  Frequent suicidal ideation with limited intensity, and duration, some specificity in terms of plans, no associated intent, good self-control, limited dysphoria/symptomatology, some risk factors present, and identifiable protective factors, including available and accessible social support.  PLAN OF CARE:   ASSESSMENT:  Diagnoses / Active Problems: Major depressive disorder with anxious features Metastatic breast cancer   PLAN: Safety and Monitoring:  -- Voluntary admission to inpatient psychiatric unit for safety, stabilization and treatment  -- Daily contact with patient to assess and evaluate symptoms and progress in treatment  -- Patient's case to be discussed in multi-disciplinary team meeting  -- Observation Level : q15 minute checks  -- Vital signs:  q12 hours  -- Precautions: suicide, elopement, and assault  2. Psychiatric Diagnoses and Treatment:    -Start Zoloft 25 mg once daily today.  Titrate during hospitalization.  For mood and anxiety.  May consider addition of Remeron or stimulant in the future. -Continue trazodone as needed for mood and insomnia -Consider hydroxyzine as needed for  anxiety -Continue gabapentin 300 mg 3 times daily -PT evaluation for unsteady gait -We will request walking device for assistance until PT evaluation.   --  The risks/benefits/side-effects/alternatives to this medication were discussed in detail with the patient and time was given for questions. The patient consents to medication trial.    -- Metabolic profile and EKG monitoring obtained while on an atypical antipsychotic (BMI: Lipid Panel: HbgA1c: QTc:)   -- Encouraged patient to participate in unit milieu and in scheduled group therapies   -- Short Term Goals: Ability to identify changes in lifestyle to reduce recurrence of condition will improve, Ability to verbalize feelings will improve, Ability to disclose and discuss suicidal ideas, Ability to demonstrate self-control will improve, Ability to identify and develop effective coping behaviors will improve, Ability to maintain clinical measurements within normal limits will improve, Compliance with prescribed medications will improve, and Ability to identify triggers associated with substance abuse/mental health issues will improve  -- Long Term Goals: Improvement in symptoms so as ready for discharge     3. Medical Issues Being Addressed:   Breast cancer  4. Discharge Planning:   -- Social work and case management to assist with discharge planning and identification of hospital follow-up needs prior to discharge  -- Estimated LOS: 5-7 days  -- Discharge Concerns: Need to establish a safety plan; Medication compliance and effectiveness  -- Discharge Goals: Return home with outpatient referrals for mental health follow-up including medication management/psychotherapy       I certify that inpatient services furnished can reasonably be expected to improve the patient's condition.   Christoper Allegra, MD 05/02/2021, 2:52 PM

## 2021-05-02 NOTE — Group Note (Signed)
Recreation Therapy Group Note   Group Topic:Stress Management  Group Date: 05/02/2021 Start Time: 0930 End Time: 0945 Facilitators: Victorino Sparrow, LRT/CTRS Location: [address] Dayroom  Goal Area(s) Addresses:  Patient will actively participate in stress management techniques presented during session.  Patient will successfully identify benefit of practicing stress management post d/c.   Group Description: Meditation.  LRT played a meditation that focused on looking at each day as a new beginning to try new things, make a new friend, take time for yourself or make peace with any individual you may have an issue with.  Patients were to listen and follow along as meditation played to engage in activity.   Affect/Mood: N/A   Participation Level: Did not attend     Clinical Observations/Individualized Feedback: Pt did not attend group.     Plan: Continue to engage patient in RT group sessions 2-3x/week.   Victorino Sparrow, LRT/CTRS 05/02/2021 12:19 PM

## 2021-05-02 NOTE — Progress Notes (Signed)
NUTRITION ASSESSMENT RD working remotely.  Pt identified as at risk on the Malnutrition Screen Tool  INTERVENTION: - continue Ensure Enlive TID, each supplement provides 350 kcal and 20 grams of protein.  NUTRITION DIAGNOSIS: Unintentional weight loss related to sub-optimal intake as evidenced by pt report.   Goal: Pt to meet >/= 90% of their estimated nutrition needs.  Monitor:  PO intake  Assessment:   She was admitted with depression, worsening anxiety, and SI with a plan to OD on medications. She informed ED staff that one of her main stressors is her recent breast cancer dx and that she often forgets to eat, to take her medications, or to drink fluids.   Ensure Enlive was ordered TID at the time of admission.  Weight yesterday was 149 lb and weight on 10/4 was 155 lb. This indicates 6 lb weight loss (3.9% body weight) in the past 2.5 weeks, significant for time frame.    50 y.o. female  Height: Ht Readings from Last 1 Encounters:  05/01/21 5' 4.17" (1.63 m)    Weight: Wt Readings from Last 1 Encounters:  05/01/21 67.6 kg    Weight Hx: Wt Readings from Last 10 Encounters:  05/01/21 67.6 kg  04/23/21 71 kg  04/15/21 70.6 kg  03/25/21 73.2 kg  03/12/21 73.5 kg  03/07/21 73.5 kg  03/05/21 73.8 kg  03/04/21 73.6 kg  01/14/21 78 kg  01/09/21 77.6 kg    BMI:  Body mass index is 25.44 kg/m. Pt meets criteria for overweight status based on current BMI.  Estimated Nutritional Needs: Kcal: 25-30 kcal/kg Protein: > 1 gram protein/kg Fluid: 1 ml/kcal  Diet Order:  Diet Order             Diet regular Room service appropriate? Yes; Fluid consistency: Thin  Diet effective now                  Pt is also offered choice of unit snacks mid-morning and mid-afternoon.  Pt is eating as desired.   Lab results and medications reviewed.      Jarome Matin, MS, RD, LDN, CNSC Inpatient Clinical Dietitian RD pager # available in [place]  After  hours/weekend pager # available in [facility]

## 2021-05-02 NOTE — Progress Notes (Addendum)
Adult Psychoeducational Group Note  Date:  05/02/2021 Time:  8:37 PM  Group Topic/Focus:  Wrap-Up Group:   The focus of this group is to help patients review their daily goal of treatment and discuss progress on daily workbooks.  Participation Level:  Active  Participation Quality:  Appropriate  Affect:  Anxious and Appropriate  Cognitive:  Disorganized and Confused  Insight: Limited  Engagement in Group:  Limited  Modes of Intervention:  Discussion  Additional Comments: Pt is new admission. Pt stated her goal for today was to focus on her treatment plan. Pt stated she accomplished her goal today. Pt stated she talked with her doctor and her social worker about her care today. Pt rated her overall day was a 3 out of 10. Pt stated she is still adjusting to the hospital. Pt stated she was able to contact her daughter today which improved her overall day. Pt stated she felt better about herself tonight. Pt stated she was able to attend all groups held today. Pt stated she was able to attend all meals. Pt stated she took all medications provided today. Pt stated her appetite was poor today. Pt rated her sleep last night was poor. Pt stated the goal tonight was to get some rest. Pt stated she had some physical pain tonight.  Pt stated she had some mild pain in her right hip tonight. Pt rated the mild pain in her right hip a 3 on the pain level scale. Pt deny visual hallucinations and auditory issues tonight.  Pt denies thoughts of harming herself or others. Pt stated she would alert staff if anything changed. Candy Sledge 05/02/2021, 8:37 PM

## 2021-05-02 NOTE — Progress Notes (Signed)
DAR NOTE: Patient presents with a flat affect and depressed mood.  Denies suicidal thoughts, auditory and visual hallucinations.  Rates depression at 10, hopelessness at 10, and anxiety at 10.  Maintained on routine safety checks.  Medications given as prescribed.  Support and encouragement offered as needed.  Attended group and participated.  Patient visible in milieu with minimal interaction.  Patient ambulating on the unit with a walker.  Patient is safe on and off the unit.

## 2021-05-02 NOTE — BH IP Treatment Plan (Signed)
Interdisciplinary Treatment and Diagnostic Plan Update  05/02/2021 Time of Session:  Mckenze Slone MRN: [id number]  Principal Diagnosis: <principal problem not specified>  Secondary Diagnoses: Active Problems:   MDD (major depressive disorder), recurrent episode, severe (HCC)   Current Medications:  Current Facility-Administered Medications  Medication Dose Route Frequency Provider Last Rate Last Admin   acetaminophen (TYLENOL) tablet 650 mg  650 mg Oral Q6H PRN Nwoko, Uchenna E, PA   650 mg at 05/02/21 1134   alum & mag hydroxide-simeth (MAALOX/MYLANTA) 200-200-20 MG/5ML suspension 30 mL  30 mL Oral Q4H PRN Nwoko, Uchenna E, PA       DULoxetine (CYMBALTA) DR capsule 20 mg  20 mg Oral Daily Margorie John W, PA-C   20 mg at 05/02/21 0843   feeding supplement (ENSURE ENLIVE / ENSURE PLUS) liquid 237 mL  237 mL Oral TID BM Lindon Romp A, NP       ferrous sulfate tablet 325 mg  325 mg Oral Q breakfast Prescilla Sours, PA-C   325 mg at 05/02/21 0844   gabapentin (NEURONTIN) capsule 300 mg  300 mg Oral TID Prescilla Sours, PA-C   300 mg at 05/02/21 1322   hydrOXYzine (ATARAX/VISTARIL) tablet 25 mg  25 mg Oral TID PRN Ileene Musa E, PA   25 mg at 05/02/21 0023   ibuprofen (ADVIL) tablet 400 mg  400 mg Oral Q6H PRN Janine Limbo, MD   400 mg at 05/02/21 1133   magnesium hydroxide (MILK OF MAGNESIA) suspension 30 mL  30 mL Oral Daily PRN Nwoko, Uchenna E, PA       pantoprazole (PROTONIX) EC tablet 40 mg  40 mg Oral Daily Margorie John W, PA-C   40 mg at 05/02/21 0844   pyridOXINE (VITAMIN B-6) tablet 100 mg  100 mg Oral Daily Margorie John W, PA-C   100 mg at 05/02/21 1322   traZODone (DESYREL) tablet 50 mg  50 mg Oral QHS PRN Nwoko, Uchenna E, PA   50 mg at 05/02/21 0023   tucatinib (TUKYSA) tablet 150 mg  150 mg Oral BID Prescilla Sours, PA-C   150 mg at 05/02/21 0712   valACYclovir (VALTREX) tablet 1,000 mg  1,000 mg Oral Daily Margorie John W, PA-C   1,000 mg at 05/02/21 1975    Facility-Administered Medications Ordered in Other Encounters  Medication Dose Route Frequency Provider Last Rate Last Admin   sodium chloride flush (NS) 0.9 % injection 10 mL  10 mL Intracatheter Once Magrinat, [name], MD       PTA Medications: Medications Prior to Admission  Medication Sig Dispense Refill Last Dose   acetaminophen (TYLENOL) 500 MG tablet Take 1 tablet (500 mg total) by mouth 3 (three) times daily as needed for mild pain (or Fever >/= 101). Take with ibuprofen 400 mg      b complex vitamins capsule Take 1 capsule by mouth daily. 900 mg      capecitabine (XELODA) 500 MG tablet Take 1,500 mg by mouth 2 (two) times daily. Takes daily for 21 days, then 7 days off, then repeat cycle      Cholecalciferol (VITAMIN D) 125 MCG (5000 UT) CAPS Take 5,000 Units by mouth daily.      dexamethasone (DECADRON) 4 MG tablet Take 2 tablets (8 mg total) by mouth daily. Start the day after chemotherapy for 2 days. 30 tablet 1    DULoxetine (CYMBALTA) 20 MG capsule TAKE 1 CAPSULE BY MOUTH EVERY DAY (Patient taking differently: Take 20  mg by mouth daily.) 90 capsule 2    ferrous sulfate 325 (65 FE) MG tablet Take 325 mg by mouth daily with breakfast.      fluconazole (DIFLUCAN) 100 MG tablet TAKE 1 TABLET BY MOUTH EVERY DAY (Patient taking differently: Take 100 mg by mouth daily.) 30 tablet 1    gabapentin (NEURONTIN) 300 MG capsule Take 1 capsule (300 mg total) by mouth 3 (three) times daily. 90 capsule 6    ibuprofen (ADVIL) 200 MG tablet Take 400 mg by mouth 3 (three) times daily as needed for mild pain (take with tylenol).      LORazepam (ATIVAN) 0.5 MG tablet Take 0.5 mg by mouth 4 (four) times daily as needed for anxiety.      ondansetron (ZOFRAN ODT) 4 MG disintegrating tablet Take 1 tablet (4 mg total) by mouth See admin instructions. Dissolve 4 mg in the mouth three times a day and an additional 4 mg at bedtime as needed for nausea/vomiting 20 tablet 3    pantoprazole (PROTONIX) 40 MG  tablet Take 40 mg by mouth daily.      polyethylene glycol (MIRALAX / GLYCOLAX) 17 g packet Take 17 g by mouth daily as needed for moderate constipation. 14 each 0    pyridOXINE (VITAMIN B-6) 100 MG tablet Take 100 mg by mouth daily.      TUKYSA 150 MG tablet Take 150 mg by mouth 2 (two) times daily.      valACYclovir (VALTREX) 1000 MG tablet Take 1 tablet (1,000 mg total) by mouth 2 (two) times daily. Take one tablet twice a day for one week, then daily (Patient taking differently: Take 1,000 mg by mouth daily.) 60 tablet 6     Patient Stressors: Health problems    Patient Strengths: Communication skills   Treatment Modalities: Medication Management, Group therapy, Case management,  1 to 1 session with clinician, Psychoeducation, Recreational therapy.   Physician Treatment Plan for Primary Diagnosis: <principal problem not specified> Long Term Goal(s):     Short Term Goals:    Medication Management: Evaluate patient's response, side effects, and tolerance of medication regimen.  Therapeutic Interventions: 1 to 1 sessions, Unit Group sessions and Medication administration.  Evaluation of Outcomes: Not Met  Physician Treatment Plan for Secondary Diagnosis: Active Problems:   MDD (major depressive disorder), recurrent episode, severe ([place])  Long Term Goal(s):     Short Term Goals:       Medication Management: Evaluate patient's response, side effects, and tolerance of medication regimen.  Therapeutic Interventions: 1 to 1 sessions, Unit Group sessions and Medication administration.  Evaluation of Outcomes: Not Met   RN Treatment Plan for Primary Diagnosis: <principal problem not specified> Long Term Goal(s): Knowledge of disease and therapeutic regimen to maintain health will improve  Short Term Goals: Ability to participate in decision making will improve, Ability to verbalize feelings will improve, and Ability to disclose and discuss suicidal ideas  Medication Management:  RN will administer medications as ordered by provider, will assess and evaluate patient's response and provide education to patient for prescribed medication. RN will report any adverse and/or side effects to prescribing provider.  Therapeutic Interventions: 1 on 1 counseling sessions, Psychoeducation, Medication administration, Evaluate responses to treatment, Monitor vital signs and CBGs as ordered, Perform/monitor CIWA, COWS, AIMS and Fall Risk screenings as ordered, Perform wound care treatments as ordered.  Evaluation of Outcomes: Not Met   LCSW Treatment Plan for Primary Diagnosis: <principal problem not specified> Long Term Goal(s): Safe transition  to appropriate next level of care at discharge, Engage patient in therapeutic group addressing interpersonal concerns.  Short Term Goals: Engage patient in aftercare planning with referrals and resources, Increase social support, and Increase ability to appropriately verbalize feelings  Therapeutic Interventions: Assess for all discharge needs, 1 to 1 time with Social worker, Explore available resources and support systems, Assess for adequacy in community support network, Educate family and significant other(s) on suicide prevention, Complete Psychosocial Assessment, Interpersonal group therapy.  Evaluation of Outcomes: Not Met   Progress in Treatment: Attending groups: No. Participating in groups: No. Taking medication as prescribed: Yes. Toleration medication: Yes. Family/Significant other contact made: No, will contact:  CSW will obtain consent Patient understands diagnosis: Yes. Discussing patient identified problems/goals with staff: Yes. Medical problems stabilized or resolved: Yes. Denies suicidal/homicidal ideation: Yes. Issues/concerns per patient self-inventory: No. Other: None  New problem(s) identified: No, Describe:  None  New Short Term/Long Term Goal(s):medication stabilization, elimination of SI thoughts, development  of comprehensive mental wellness plan.   Patient Goals: "Come out of some of this depression."  Discharge Plan or Barriers: Patient recently admitted. CSW will continue to follow and assess for appropriate referrals and possible discharge planning.   Reason for Continuation of Hospitalization: Depression Medication stabilization Suicidal ideation  Estimated Length of Stay: 3-5 days   Scribe for Treatment Team: Eliott Nine 05/02/2021 2:57 PM

## 2021-05-02 NOTE — [facility] Group Notes (Signed)
PT did not attend Psychoeducational group

## 2021-05-03 ENCOUNTER — Emergency Department (HOSPITAL_COMMUNITY)
Admission: EM | Admit: 2021-05-03 | Discharge: 2021-05-04 | Disposition: A | Discharge status: Other Acute Inpatient Hospital | Payer: Medicaid Other | Source: Home / Self Care | Attending: Emergency Medicine | Admitting: Emergency Medicine

## 2021-05-03 ENCOUNTER — Emergency Department (HOSPITAL_COMMUNITY): Payer: Medicaid Other

## 2021-05-03 ENCOUNTER — Encounter (HOSPITAL_COMMUNITY): Payer: Self-pay

## 2021-05-03 DIAGNOSIS — C50919 Malignant neoplasm of unspecified site of unspecified female breast: Secondary | ICD-10-CM

## 2021-05-03 DIAGNOSIS — F32A Depression, unspecified: Secondary | ICD-10-CM | POA: Insufficient documentation

## 2021-05-03 DIAGNOSIS — M4850XA Collapsed vertebra, not elsewhere classified, site unspecified, initial encounter for fracture: Secondary | ICD-10-CM | POA: Insufficient documentation

## 2021-05-03 DIAGNOSIS — D72819 Decreased white blood cell count, unspecified: Secondary | ICD-10-CM

## 2021-05-03 DIAGNOSIS — Z853 Personal history of malignant neoplasm of breast: Secondary | ICD-10-CM | POA: Insufficient documentation

## 2021-05-03 DIAGNOSIS — Z96641 Presence of right artificial hip joint: Secondary | ICD-10-CM | POA: Insufficient documentation

## 2021-05-03 DIAGNOSIS — F329 Major depressive disorder, single episode, unspecified: Secondary | ICD-10-CM

## 2021-05-03 LAB — DIFFERENTIAL
Abs Immature Granulocytes: 0.01 10*3/uL (ref 0.00–0.07)
Basophils Absolute: 0 10*3/uL (ref 0.0–0.1)
Basophils Relative: 1 %
Eosinophils Absolute: 0.1 10*3/uL (ref 0.0–0.5)
Eosinophils Relative: 6 %
Immature Granulocytes: 1 %
Lymphocytes Relative: 21 %
Lymphs Abs: 0.3 10*3/uL — ABNORMAL LOW (ref 0.7–4.0)
Monocytes Absolute: 0.1 10*3/uL (ref 0.1–1.0)
Monocytes Relative: 7 %
Neutro Abs: 0.9 10*3/uL — ABNORMAL LOW (ref 1.7–7.7)
Neutrophils Relative %: 64 %

## 2021-05-03 LAB — LACTIC ACID, PLASMA
Lactic Acid, Venous: 3.3 mmol/L (ref 0.5–1.9)
Lactic Acid, Venous: 2.1 mmol/L (ref 0.5–1.9)

## 2021-05-03 LAB — CBC WITH DIFFERENTIAL/PLATELET
Band Neutrophils: 3 %
Basophils Relative: 2 %
Blasts: NONE SEEN %
Eosinophils Relative: 7 %
HCT: 31.5 % — ABNORMAL LOW (ref 36.0–46.0)
Hemoglobin: 10.3 g/dL — ABNORMAL LOW (ref 12.0–15.0)
Lymphocytes Relative: 19 %
MCH: 33.2 pg (ref 26.0–34.0)
MCHC: 32.7 g/dL (ref 30.0–36.0)
MCV: 101.6 fL — ABNORMAL HIGH (ref 80.0–100.0)
Metamyelocytes Relative: NONE SEEN %
Monocytes Relative: 13 %
Myelocytes: NONE SEEN %
Neutrophils Relative %: 56 %
Platelets: 130 10*3/uL — ABNORMAL LOW (ref 150–400)
Promyelocytes Relative: NONE SEEN %
RBC Morphology: NORMAL
RBC: 3.1 MIL/uL — ABNORMAL LOW (ref 3.87–5.11)
RDW: 22.4 % — ABNORMAL HIGH (ref 11.5–15.5)
WBC Morphology: NORMAL
WBC: 1.3 10*3/uL — CL (ref 4.0–10.5)
nRBC: 2 /100 WBC — ABNORMAL HIGH

## 2021-05-03 LAB — BASIC METABOLIC PANEL
Anion gap: 9 (ref 5–15)
BUN: 11 mg/dL (ref 6–20)
CO2: 22 mmol/L (ref 22–32)
Calcium: 7.3 mg/dL — ABNORMAL LOW (ref 8.9–10.3)
Chloride: 103 mmol/L (ref 98–111)
Creatinine, Ser: 0.52 mg/dL (ref 0.44–1.00)
GFR, Estimated: >60 mL/min (ref >60)
Glucose, Bld: 130 mg/dL — ABNORMAL HIGH (ref 70–99)
Potassium: 3 mmol/L — ABNORMAL LOW (ref 3.5–5.1)
Sodium: 134 mmol/L — ABNORMAL LOW (ref 135–145)

## 2021-05-03 LAB — CBC
HCT: 35.7 % — ABNORMAL LOW (ref 36.0–46.0)
Hemoglobin: 11.3 g/dL — ABNORMAL LOW (ref 12.0–15.0)
MCH: 32.8 pg (ref 26.0–34.0)
MCHC: 31.7 g/dL (ref 30.0–36.0)
MCV: 103.8 fL — ABNORMAL HIGH (ref 80.0–100.0)
Platelets: 142 10*3/uL — ABNORMAL LOW (ref 150–400)
RBC: 3.44 MIL/uL — ABNORMAL LOW (ref 3.87–5.11)
RDW: 22.4 % — ABNORMAL HIGH (ref 11.5–15.5)
WBC: 1.3 10*3/uL — CL (ref 4.0–10.5)
nRBC: 0 % (ref 0.0–0.2)

## 2021-05-03 IMAGING — CR DG CHEST 2V
2 series · 2 of 2 positions shown · non-contrast
Comparison: Radiograph [DATE], CT [DATE]

CLINICAL DATA: Shortness of breath. Weakness and fatigue. Breast
cancer with active chemotherapy.

EXAM:
CHEST - 2 VIEW

[w chest lat]
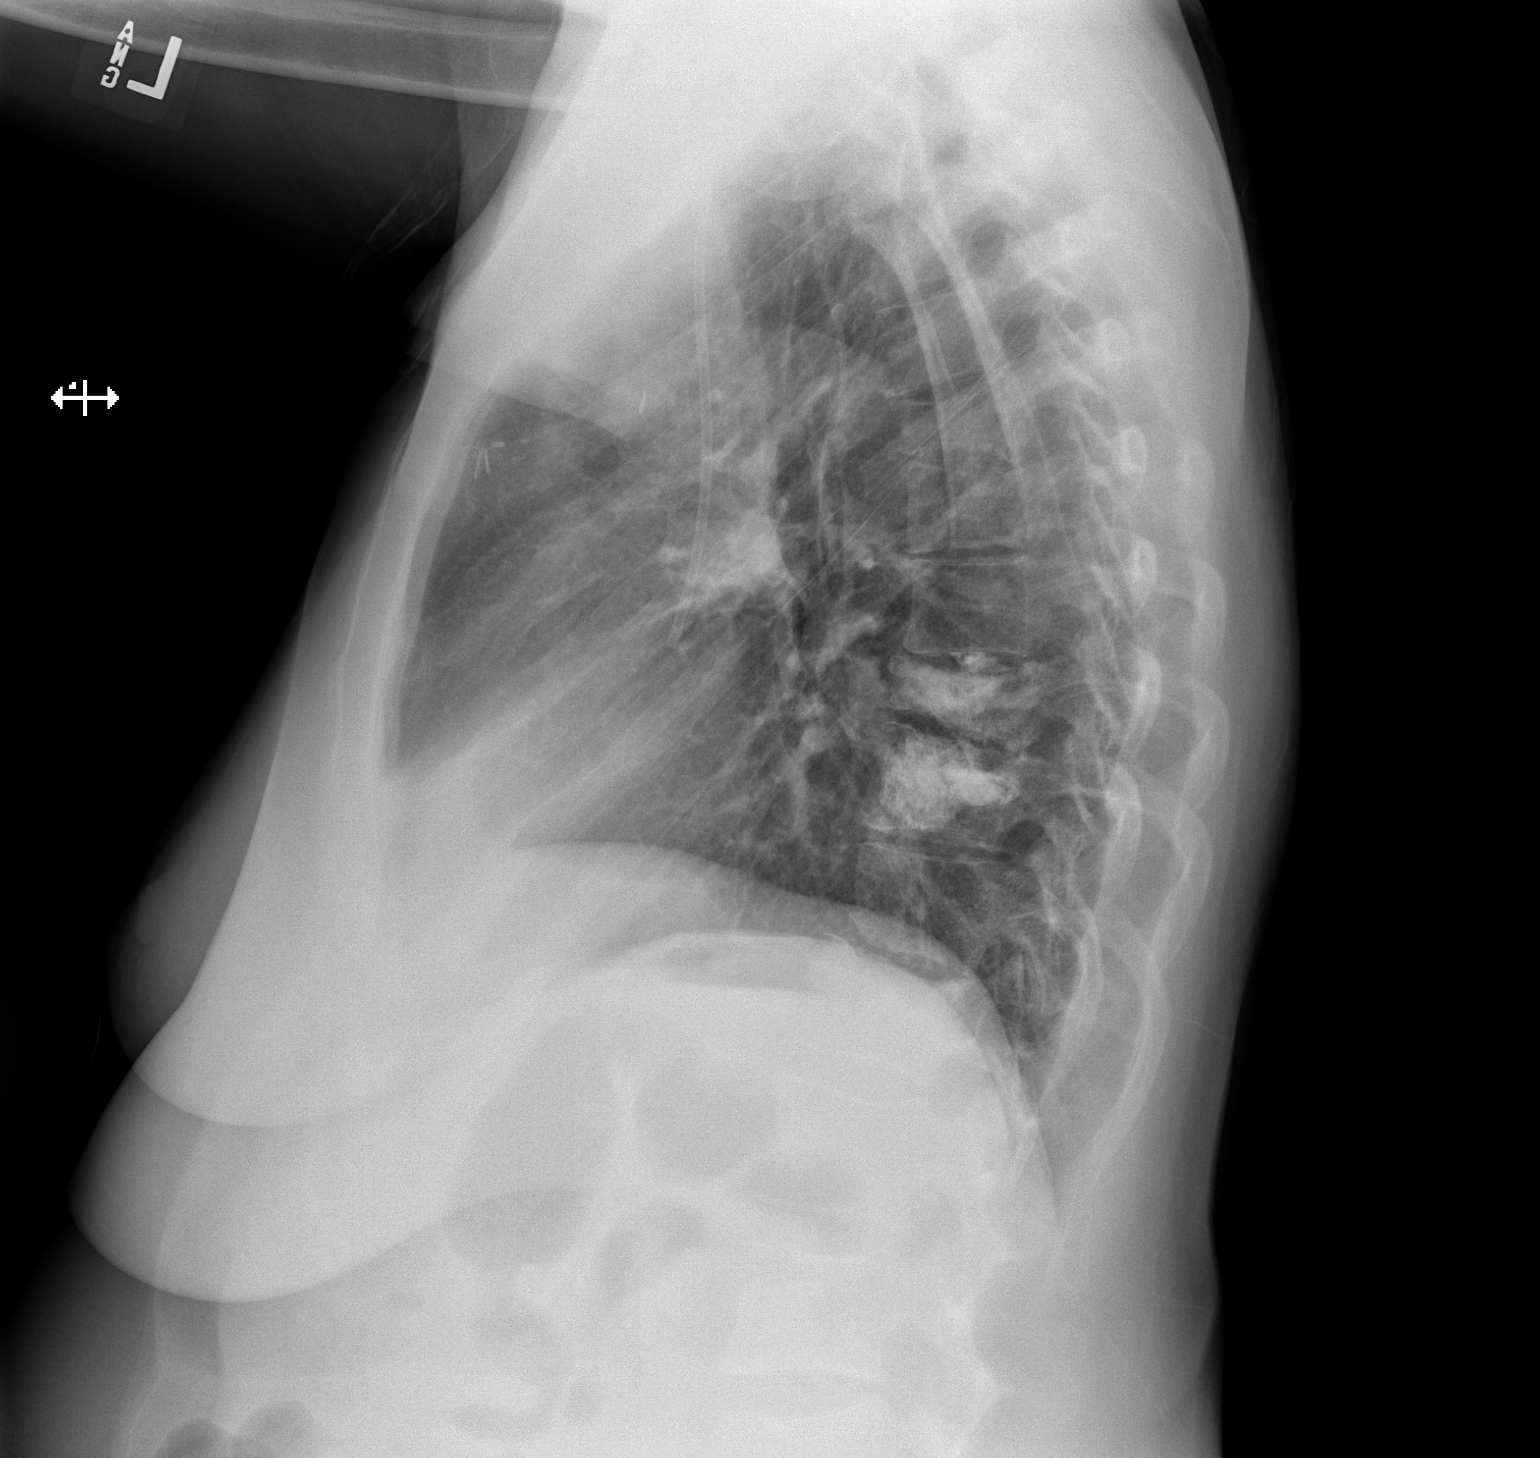

[x chest ap]
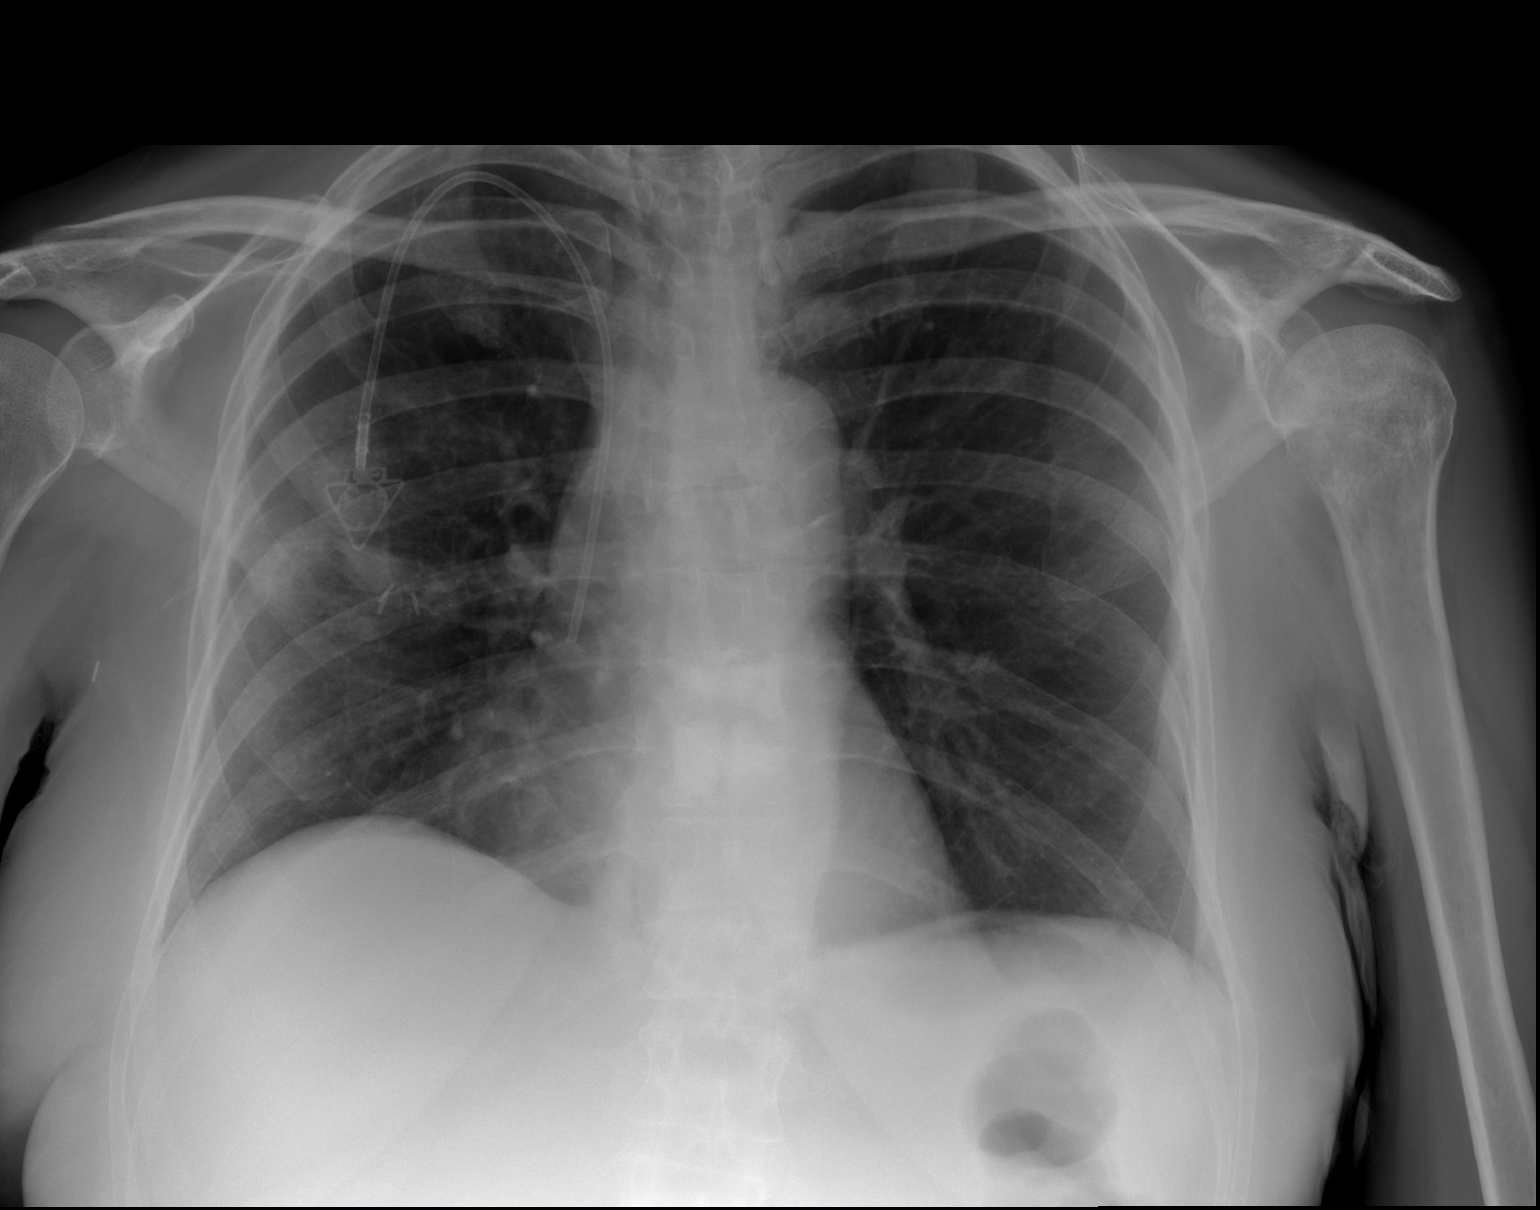

[2 of 2 positions shown; findings below may reference images not displayed]

FINDINGS: Right chest port with tip in the SVC. Nodular opacity in the right
mid lung just inferior to the site of port, corresponds to nodular
opacities on CT. The lungs are otherwise clear. No pulmonary edema,
pleural effusion, or pneumothorax. Slight volume loss in the right
hemithorax is stable from prior CT. Surgical clips in the right
axilla. Kyphoplasty within T9 and T10. Similar compression fractures
in the thoracic spine. Osseous metastatic disease with patchy areas
of sclerosis, assessed on recent CT.
IMPRESSION: 1. Stable nodular consolidation in the right mid lung compared with
recent CT.
2. No acute intrathoracic findings.
3. Osseous metastatic disease.

## 2021-05-03 MED ORDER — POTASSIUM CHLORIDE CRYS ER 20 MEQ PO TBCR
40.0000 meq | EXTENDED_RELEASE_TABLET | Freq: Once | ORAL | Status: AC
  Administered 2021-05-04: 40 meq via ORAL
  Filled 2021-05-03: qty 2

## 2021-05-03 MED ORDER — SODIUM CHLORIDE 0.9 % IV BOLUS
1000.0000 mL | Freq: Once | INTRAVENOUS | Status: AC
  Administered 2021-05-03: 1000 mL via INTRAVENOUS

## 2021-05-03 MED ORDER — SERTRALINE HCL 25 MG PO TABS: 25.0000 mg | ORAL_TABLET | Freq: Every day | ORAL | 0 refills | Status: DC

## 2021-05-03 MED ORDER — MIRTAZAPINE 7.5 MG PO TABS: 7.5000 mg | ORAL_TABLET | Freq: Every day | ORAL | 0 refills | Status: DC

## 2021-05-03 MED ORDER — SODIUM CHLORIDE 0.9 % IV SOLN: INTRAVENOUS | Status: DC

## 2021-05-03 NOTE — ED Notes (Signed)
Pt aware urine sample is needed, but states she is unable to urinate at this time.

## 2021-05-03 NOTE — Group Note (Signed)
LCSW Group Therapy Note  No therapy group could be held this day due to staffing issues.  Another licensed group was held.  Selmer Dominion, LCSW 05/03/2021 11:39 AM

## 2021-05-03 NOTE — [facility] Group Notes (Signed)
.  Psychoeducational Group Note  Date: 05/03/2021 Time: 0900-1000    Goal Setting   Purpose of Group: This group helps to provide patients with the steps of setting a goal that is specific, measurable, attainable, realistic and time specific. A discussion on how we keep ourselves stuck with negative self talk.    Participation Level:  Did not attend   Monica [name]

## 2021-05-03 NOTE — Discharge Summary (Addendum)
Physician Discharge Summary Note  Patient:  Monica [name] is an 50 y.o., female MRN:  [id number] DOB:  08/19/1970 Patient phone:  [phone] (home)  Patient address:   [place] [address],  Total Time spent with patient:  UTA d/t patient acuity and medical urgency for ED transfer.  Date of Admission:  05/01/2021 Date of Discharge: 05/05/2021    ADMISSION ASSESSMENT: Monica [name] is a 50 year old female with no prior psychiatric history and a medical history of Stage IV, metastatic triple positive breast cancer admitted voluntarily for worsening depression and SI.    On assessment at [facility], Monica [name] endorses that over the past couple of months, since the recurrence and metastatic nature of her breast cancer, she has felt down, depressed, hopeless, lonely and without reason to live.She endorses being in pain 2/2 mets, s/p R hip replacement which has made ambulating difficult, and increased difficulty in completing ADLs. She reports that she no longer wants interventions, as she feels as though she no longer has control of her life nor her care; she had the plan to end her life with pills. She is, however, able to contract for safety on the unit.   She endorses SI, but denies HI/AVH, paranoia and other first-rank sx, and does not voice delusions nor appears to be internally preoccupied. Of her depression, along with the aforementioned sx, Monica [name] also endorses poor sleep (nighttime insomnia and daytime hypersomnolence), anhedonia, difficulty concentrating, and poor appetite. She denies manic/hypomanic sx, sx of PTSD, and anxiety. Additional somatic sx include painful mouth sores likely 2/2 steroid use, xerostomia, and ongoing GI discomfort. Monica [name] insists that she does not want to increase the Cymbalta dose, and although initially hesitant, she is open to medication changes.  Principal Problem: MDD (major depressive disorder), single episode with melancholic  features Discharge Diagnoses: Principal Problem:   MDD (major depressive disorder), single episode with melancholic features Active Problems:   Malignant neoplasm of upper-inner quadrant of right breast in female, estrogen receptor positive ([place])   Suicidal ideation   Past Psychiatric History: See H&P  Past Medical History:  Past Medical History:  Diagnosis Date   Arthritis    lower back   Cancer ([place])    right breast cancer   Family history of breast cancer    Personal history of chemotherapy    Personal history of radiation therapy     Past Surgical History:  Procedure Laterality Date   ABDOMINAL HYSTERECTOMY     APPLICATION OF CRANIAL NAVIGATION N/A 12/06/2020   Procedure: APPLICATION OF CRANIAL NAVIGATION;  Surgeon: Judith Part, MD;  Location: [facility];  Service: Neurosurgery;  Laterality: N/A;  RM 20   BREAST LUMPECTOMY Right    BREAST LUMPECTOMY WITH RADIOACTIVE SEED AND SENTINEL LYMPH NODE BIOPSY Right 02/16/2018   Procedure: RIGHT BREAST LUMPECTOMY WITH RADIOACTIVE SEED AND SENTINEL LYMPH NODE BIOPSY;  Surgeon: [name], MD;  Location: [facility];  Service: General;  Laterality: Right;   CRANIOTOMY Right 12/06/2020   Procedure: Right sided awake craniotomy for tumor resection;  Surgeon: Judith Part, MD;  Location: [facility];  Service: Neurosurgery;  Laterality: Right;   HYMENECTOMY     IR IMAGING GUIDED PORT INSERTION  03/12/2021   IR US GUIDE BX ASP/DRAIN  12/02/2020   KYPHOPLASTY N/A 03/07/2021   Procedure: Thoracic nine, Thoracic ten KYPHOPLASTY;  Surgeon: Judith Part, MD;  Location: [facility];  Service: Neurosurgery;  Laterality: N/A;   PORTACATH PLACEMENT Right 02/16/2018  Procedure: INSERTION PORT-A-CATH;  Surgeon: [name], MD;  Location: [facility];  Service: General;  Laterality: Right;   RE-EXCISION OF BREAST LUMPECTOMY Right 02/22/2018   Procedure: RE-EXCISION OF RIGHT  BREAST LUMPECTOMY;  Surgeon:  [name], MD;  Location: [facility];  Service: General;  Laterality: Right;   REPAIR VAGINAL CUFF N/A 02/07/2017   Procedure: REPAIR VAGINAL CUFF;  Surgeon: Ena Dawley, MD;  Location: [facility];  Service: Gynecology;  Laterality: N/A;   ROBOTIC ASSISTED TOTAL HYSTERECTOMY WITH SALPINGECTOMY Left 01/20/2017   Procedure: ROBOTIC ASSISTED TOTAL HYSTERECTOMY WITH SALPINGECTOMY;  Surgeon: [name], MD;  Location: [facility];  Service: Gynecology;  Laterality: Left;   TOTAL HIP ARTHROPLASTY Right 2022   total hip and partial femur, per pt   Family History:  Family History  Problem Relation Age of Onset   Lung cancer Maternal Grandfather    Esophageal cancer Paternal Grandfather 47   Breast cancer Cousin 40   Family Psychiatric  History: See H&P Social History:  Social History   Substance and Sexual Activity  Alcohol Use Not Currently     Social History   Substance and Sexual Activity  Drug Use No    Social History   Socioeconomic History   Marital status: Legally Separated    Spouse name: Not on file   Number of children: Not on file   Years of education: Not on file   Highest education level: Not on file  Occupational History   Not on file  Tobacco Use   Smoking status: Never   Smokeless tobacco: Never  Vaping Use   Vaping Use: Former  Substance and Sexual Activity   Alcohol use: Not Currently   Drug use: No   Sexual activity: Not Currently    Birth control/protection: Surgical  Other Topics Concern   Not on file  Social History Narrative   Not on file   Social Determinants of Health   Financial Resource Strain: Not on file  Food Insecurity: Not on file  Transportation Needs: Not on file  Physical Activity: Not on file  Stress: Not on file  Social Connections: Not on file    Hospital Course:  After the above admission evaluation, Monica [name]'s presenting symptoms were noted. She was recommended for mood stabilization treatments. The  medication regimen targeting those presenting symptoms were discussed with her & initiated with her consent: Her home Cymbalta was d/c and Zoloft 25 mg was initiated for depression as well as Remeron 7.5 mg for depression, insomnia, and poor appetite.  Her UDS on arrival to the ED was not collected, BAL neg, so she did not receive alcohol detoxification treatments. She was however medicated, stabilized & discharged on the medications as listed on her discharge medication lists below. Besides the mood stabilization treatments, Monica [name] was also enrolled & participated in the group counseling sessions being offered & held on this unit. She learned coping skills. She presented with the following pre-existing medical condition that required treatment: Stage IV metastatic, triple positive breast cancer, for which she provided her own chemotherapy medication. She tolerated her treatment regimen without any adverse effects or reactions reported.  This admission was complicated by neutropenia, with WBC 1.3, ANC 900, for which she was sent to [facility] for further workup, per the recommendation of oncologist, Dr. Curt Bears. In the ED, she was given Granix, sent back to [facility], and advised to return to the ED if she became febrile. Later that day, she developed diarrhea and nausea.  The next morning, she became febrile to 103.1, had an episode of emesis, and WBC 0.8, ANC 300. She was transferred back to [facility] for workup and admitted to the medical floor.    During the course of her hospitalization, the 15-minute checks were adequate to ensure patient's safety. Monica [name] did not display any dangerous, violent or suicidal behavior on the unit.  She interacted with patients & staff appropriately, participated appropriately in the group sessions/therapies. Her medications were addressed & adjusted to meet her needs. She reports improved sleep with Remeron, but her appetite remains poor.  At the time of discharge patient continues to  report passive suicidal ideation, so she will be followed by the consulting psychiatric team during her admission. She denies AH/VH, delusional thoughts or paranoia. She does not appear to be responding to any internal stimuli.   Physical Findings:  Musculoskeletal: Strength & Muscle Tone: decreased Gait & Station: unsteady, with walker Patient leans: Left   Psychiatric Specialty Exam: MSE LISTED BELOW WAS THE MOST RECENT MSE, AS THE RESIDENT NOR MD WERE ABLE TO ASSESS PATIENT PRIOR TO HER TRANSFER/DISCHARGE TO THE EMERGENCY DEPARTMENT.   Presentation  General Appearance: MD did not have the chance to see her prior to transfer, but RN reports she appeared ill.  Eye Contact:Good  Speech:Clear and Coherent; Normal Rate  Speech Volume:Normal  Handedness:Right   Mood and Affect  Mood:Anxious; Depressed; Dysphoric  Affect:Flat; Congruent   Thought Process  Thought Processes:Coherent; Linear  Descriptions of Associations:Intact  Orientation:Full (Time, Place and Person)  Thought Content:Logical  History of Schizophrenia/Schizoaffective disorder:No Duration of Psychotic Symptoms:Never experienced Hallucinations:Denies  Ideas of Reference:None  Suicidal Thoughts:Yes, passive  Homicidal Thoughts:No   Sensorium  Memory:Immediate Good; Recent Good; Remote Good  Judgment:Fair  Insight:Fair   Executive Functions  Concentration:Poor  Attention Span:Fair  [place]   Psychomotor Activity  Psychomotor Activity:Normal; gait slowed 2/2 pain from hip replacement   Assets  Assets:Communication Skills; Desire for Improvement; Financial Resources/Insurance; Housing; Leisure Time; Physical Health   Sleep  Sleep:Per RN: poor 2/2 illness    Physical Exam: Physical Exam Vitals and nursing note (UTA prior to ED transfer) reviewed.  PT LEFT PRIOR TO PHYSICAL EXAM ASSESSMENT ON DAY OF TRANSFER/DISCHARGE TO EMERGENCY  DEPARTMENT   Review of Systems  Unable to perform ROS: Medical condition  PT LEFT PRIOR TO ROS ASSESSMENT ON DAY OF TRANSFER/DISCHARGE TO EMERGENCY DEPARTMENT   Blood pressure (!) 142/86, pulse 99, temperature 98.4 F (36.9 C), temperature source Oral, resp. rate 16, height 5' 4.17" (1.63 m), weight 67.6 kg, last menstrual period 12/21/2016, SpO2 100 %. Body mass index is 25.44 kg/m.   Social History   Tobacco Use  Smoking Status Never  Smokeless Tobacco Never   Tobacco Cessation:  N/A, patient does not currently use tobacco products   Blood Alcohol level:  Lab Results  Component Value Date   ETH <10 34/74/2595    Metabolic Disorder Labs:  Lab Results  Component Value Date   HGBA1C 6.1 (H) 05/02/2021   MPG 128.37 05/02/2021   No results found for: PROLACTIN Lab Results  Component Value Date   CHOL 177 05/02/2021   TRIG 193 (H) 05/02/2021   HDL 65 05/02/2021   CHOLHDL 2.7 05/02/2021   VLDL 39 05/02/2021   [place] 73 05/02/2021    See Psychiatric Specialty Exam and Suicide Risk Assessment completed by Attending Physician prior to discharge.  Discharge destination:  Other:  [facility]  Is patient on multiple  antipsychotic therapies at discharge:  No   Has Patient had three or more failed trials of antipsychotic monotherapy by history:  No  Recommended Plan for Multiple Antipsychotic Therapies: NA   Allergies as of 05/03/2021   No Known Allergies      Medication List     STOP taking these medications    DULoxetine 20 MG capsule Commonly known as: CYMBALTA   LORazepam 0.5 MG tablet Commonly known as: ATIVAN       TAKE these medications      Indication  acetaminophen 500 MG tablet Commonly known as: TYLENOL Take 1 tablet (500 mg total) by mouth 3 (three) times daily as needed for mild pain (or Fever >/= 101). Take with ibuprofen 400 mg  Indication: Pain   b complex vitamins capsule Take 1 capsule by mouth daily. 900 mg  Indication: Vitamin  Deficiency   capecitabine 500 MG tablet Commonly known as: XELODA Take 1,500 mg by mouth 2 (two) times daily. Takes daily for 21 days, then 7 days off, then repeat cycle  Indication: Cancer of the Breast   dexamethasone 4 MG tablet Commonly known as: DECADRON Take 2 tablets (8 mg total) by mouth daily. Start the day after chemotherapy for 2 days.  Indication: Cancer of the Breast   ferrous sulfate 325 (65 FE) MG tablet Take 325 mg by mouth daily with breakfast.  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  Indication: Neuropathic Pain   ibuprofen 200 MG tablet Commonly known as: ADVIL Take 400 mg by mouth 3 (three) times daily as needed for mild pain (take with tylenol).  Indication: Pain   mirtazapine 7.5 MG tablet Commonly known as: REMERON Take 1 tablet (7.5 mg total) by mouth at bedtime.  Indication: Major Depressive Disorder   ondansetron 4 MG disintegrating tablet Commonly known as: Zofran ODT Take 1 tablet (4 mg total) by mouth See admin instructions. Dissolve 4 mg in the mouth three times a day and an additional 4 mg at bedtime as needed for nausea/vomiting  Indication: Nausea and Vomiting caused by Cancer Chemotherapy   pantoprazole 40 MG tablet Commonly known as: PROTONIX Take 40 mg by mouth daily.  Indication: Gastroesophageal Reflux Disease   polyethylene glycol 17 g packet Commonly known as: MIRALAX / GLYCOLAX Take 17 g by mouth daily as needed for moderate constipation.  Indication: Constipation   pyridOXINE 100 MG tablet Commonly known as: VITAMIN B-6 Take 100 mg by mouth daily.  Indication: Decrease in Appetite, Depression   sertraline 25 MG tablet Commonly known as: ZOLOFT Take 1 tablet (25  mg total) by mouth daily.  Indication: Major Depressive Disorder   Tukysa 150 MG tablet Generic drug: tucatinib Take 150 mg by mouth 2 (two) times daily.  Indication: HER2 Positive Breast Cancer   valACYclovir 1000 MG tablet Commonly known as: VALTREX Take 1 tablet (1,000 mg total) by mouth 2 (two) times daily. 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  Indication: Vitamin D Deficiency  Follow-up recommendations:  Other:  [facility] for medical workup.    Signed: Rosezetta Schlatter, MD 05/05/2021, 3:32 PM    Total Time Spent in Direct Patient Care:  I personally spent 15 minutes on the unit in indirect patient care.   I reviewed the patient's chart, and I discussed the case with the resident physician, and I agree with the assessment and plan of care as documented in the resident physician's note, as addended by me or notated below:  I directly addended the discharge summary. Patient was transferred/discharged to the emergency department, due to fever and neutropenia, prior to my ability to assess the mental status of the patient.   Janine Limbo, MD Psychiatrist

## 2021-05-03 NOTE — B[facility] Counselor (Signed)
Adult Comprehensive Assessment  Patient ID: Monica [name], female   DOB: [birth year], 50 y.o.   MRN: [id number]  Information Source:    Current Stressors:  Patient states their primary concerns and needs for treatment are:: Severe depression Patient states their goals for this hospitilization and ongoing recovery are:: Come out of some of the depression. Educational / Learning stressors: Denies stressors Employment / Job issues: Denies stressors Family Relationships: Is losing identity Museum/gallery curator / Lack of resources (include bankruptcy): Big stressor Housing / Lack of housing: Big stressor Physical health (include injuries & life threatening diseases): Big stressor Social relationships: Quite stressful Substance abuse: Denies stressors Bereavement / Loss: Denies stressors  Living/Environment/Situation:  Living Arrangements: Alone Living conditions (as described by patient or guardian): Apartment Who else lives in the home?: Nobody How long has patient lived in current situation?: 2 months What is atmosphere in current home: Other (Comment) IT trainer, 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)  Family History:  Marital status: Divorced Divorced, when?: Divorced twice, last one a little more than a year ago What types of issues is patient dealing with in the relationship?: None Does patient have children?: Yes How many children?: 2 How is patient's relationship with their children?: Son is in the [employer] in [place] - good relationship;  Daughter lives 10 minutes away - good relationship  Childhood History:  By whom was/is the patient raised?: Both parents Description of patient's relationship with caregiver when they were a child: Goodo with both Patient's description of current relationship with people who raised him/her: Excellent with both - they live in [place]. How were you disciplined when you got in trouble as a child/adolescent?: Spanked Does patient have siblings?: Yes Number  of Siblings: 6 Description of patient's current relationship with siblings: Is the oldest with 27 younger siblings - excellent relationships.  None of them live close by Did patient suffer any verbal/emotional/physical/sexual abuse as a child?: No Did patient suffer from severe childhood neglect?: No Has patient ever been sexually abused/assaulted/raped as an adolescent or adult?: No Was the patient ever a victim of a crime or a disaster?: No Witnessed domestic violence?: No Has patient been affected by domestic violence as an adult?: No  Education:  Highest grade of school patient has completed: Some college Currently a student?: No Learning disability?: No  Employment/Work Situation:   Employment Situation: On disability Why is Patient on Disability: Cancer How Long has Patient Been on Disability: Does not start until December 2022 - has been out from work since May 2022 What is the Longest Time Patient has Held a Job?: A long time 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  Mother came down to help but then she had to go back home.  "Things started to go downhill." Type of faith/religion: Darrick Meigs How does patient's faith help to cope with current illness?: "It's good."  Leisure/Recreation:   Do You Have Hobbies?: Yes Leisure and Hobbies: Used to paint, read -- medication has affected her eyesight  though.  Strengths/Needs:   What is the patient's perception of their strengths?: "Right now, none." Patient states they can use these personal strengths during their treatment to contribute to their recovery: N/A Patient states these barriers may affect/interfere with their treatment: None Patient states these barriers may affect their return to the community: None Other important information patient would like considered in planning for their treatment: None  Discharge Plan:   Currently receiving community mental health services: No Patient states concerns and preferences for aftercare planning are: (P) Will need help with her medications,not sure if current PCP or oncologist would prescribe, not sure if needs psychiatrist.  Not interested in counseling. Patient states they will know when they are safe and ready for discharge when: (P) More clarity of mind, "I feel like I'm losing my mind, so it's going the other way, not getting clearer.  I need to be able to take care of my medicines at home, right now I feel I would mess them up." Does patient have access to transportation?: (P) Yes Does patient have financial barriers related to discharge medications?: (P) No Plan for living situation after discharge: (P) Not sure what to do.  Does not feel can take care of herself. Will patient be returning to same living situation after discharge?: (P) No  Summary/Recommendations:   Summary and Recommendations (to be completed by the evaluator): Patient is a 50yo female with breast cancer who is currently undergoing chemotherapy and radiation who is hospitalized for complaint of severe depression with suicidal thoughts.  Her mother had come from [place] and was helping her, but eventually had to return to her own life, and since then the patient has felt she cannot take care of herself.   She is afraid of returning home and messing up her medications.  She is not sure if her primary care physician or  oncologist would be willing to prescribe her psychiatric medications so may need a psychiatry referral.  She does not feel a desire or need for therapy.  She has been divorced two times, with no ongoing issues.  She has two children, one in [place] in the [employer] and one who lives 10 minutes away and comes to see her daily.  She has 6 siblings who live around the country and parents live in [place], so although they all have "excellent" relationships, she does not feel supported, but feels lonely.  She would benefit from group therapy, medication management, crisis stabilization, psychoeducation, and discharge planning.  At discharge it is recommended that she adhere to the established aftercare plan.  Maretta Los. 05/03/2021

## 2021-05-03 NOTE — Progress Notes (Signed)
Note: Patient transferred to [facility] for medical evaluation due to abnormal lab result (WBC 1.3 and ANC 900).  Report given to charge nurse Denton Ar).  Patient is alert and oriented x 4. Vital signs result: 98.4 99 16 142/86.

## 2021-05-03 NOTE — [facility] Group Notes (Signed)
.  Psychoeducational Group Note    Date:05/03/2021 Time: 1300-1400    Purpose of Group: . The group focus' on teaching patients on how to identify their needs and how Life Skills:  A group where two lists are made. What people need and what are things that we do that are healthy. The lists are developed by the patients and it is explained that we often do the actions that are not healthy to get our list of needs met.  to develop the coping skills needed to get their needs met  Participation Level:  Did not attend   Monica [name]

## 2021-05-03 NOTE — ED Provider Notes (Addendum)
[facility] DEPT Provider Note   CSN: [id number] Arrival date & time: 05/03/21  1744     History Chief Complaint  Patient presents with   Abnormal Lab    Monica [name] is a 50 y.o. female.  Patient sent over from behavioral health.  Patient was evaluated here in the emergency department on October 20.  Was screened medically cleared and sent over to behavioral health.  Patient is followed by hematology oncology Dr. Jana Hakim.  For breast cancer originally diagnosed in 2019 now metastatic.  Patient is undergoing chemotherapy.  Patiently they added on to her regiment [employer].  And last seen by them on October 12.  She says that the IV chemo will be every 3 weeks.  Patient according to behavioral health definitely has depression.  They sent her over because of a white blood cell count of 1.3.  Patient's white blood cell count when she was sent over was 2.4.  I will go ahead and repeat that here tonight with differential to see what her absolute neutrophil count is.  Behavioral health said it was 900.  But I did not see where there was a true differential today.  Patient was screened by our triage provider.  And they ordered lactic acid.  First 1 was 3.3-second was 2.1.  Also give her some IV fluids.  Patient does not have any respiratory symptoms or any fevers.  No dysuria.  Our purpose here is to get her medically cleared.  If necessary can talk to heme-onc.  She has not been discharged from behavioral health.      Past Medical History:  Diagnosis Date   Arthritis    lower back   Cancer [facility])    right breast cancer   Family history of breast cancer    Personal history of chemotherapy    Personal history of radiation therapy     Patient Active Problem List   Diagnosis Date Noted   Compression fracture of vertebral column ([place]) 05/03/2021   MDD (major depressive disorder), single episode with melancholic features 62/69/4854   Suicidal ideation 05/02/2021    Body mass index (BMI) 25.0-25.9, adult 03/20/2021   Impaired mobility and ADLs 01/27/2021   Superficial thrombophlebitis of left upper extremity 01/26/2021   Acneiform rash 01/22/2021   Elevated LFTs 01/22/2021   Hypoalbuminemia due to protein-calorie malnutrition ([place]) 01/22/2021   Metastatic breast cancer ([place]) 01/21/2021   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[place]) 01/21/2021   Hypokalemia    Hypomagnesemia    Hypercalcemia    Pain 01/14/2021   Lung metastases ([place]) 12/31/2020   Liver metastases ([place]) 12/31/2020   Brain tumor ([place]) 12/06/2020   Palliative care by specialist    Bone metastases [facility])    Metastatic malignant neoplasm ([place])    Brain metastases ([place])    Cancer related pain 11/30/2020   Port-A-Cath in place 03/08/2018   Goals of care, counseling/discussion 02/03/2018   Family history of breast cancer 01/12/2018   Malignant neoplasm of upper-inner quadrant of right breast in female, estrogen receptor positive ([place]) 01/05/2018   Vaginal bleeding 02/07/2017   Chronic anemia 02/07/2017   Orthostasis 02/07/2017   S/P laparoscopic hysterectomy 01/20/2017    Past Surgical History:  Procedure Laterality Date   ABDOMINAL HYSTERECTOMY     APPLICATION OF CRANIAL NAVIGATION N/A 12/06/2020   Procedure: APPLICATION OF CRANIAL NAVIGATION;  Surgeon: Judith Part, MD;  Location: [place];  Service: Neurosurgery;  Laterality: N/A;  RM 20  BREAST LUMPECTOMY Right    BREAST LUMPECTOMY WITH RADIOACTIVE SEED AND SENTINEL LYMPH NODE BIOPSY Right 02/16/2018   Procedure: RIGHT BREAST LUMPECTOMY WITH RADIOACTIVE SEED AND SENTINEL LYMPH NODE BIOPSY;  Surgeon: [name], MD;  Location: [place];  Service: General;  Laterality: Right;   CRANIOTOMY Right 12/06/2020   Procedure: Right sided awake craniotomy for tumor resection;  Surgeon: Judith Part, MD;  Location: [place];  Service: Neurosurgery;  Laterality: Right;   HYMENECTOMY      IR IMAGING GUIDED PORT INSERTION  03/12/2021   IR US GUIDE BX ASP/DRAIN  12/02/2020   KYPHOPLASTY N/A 03/07/2021   Procedure: Thoracic nine, Thoracic ten KYPHOPLASTY;  Surgeon: Judith Part, MD;  Location: [place];  Service: Neurosurgery;  Laterality: N/A;   PORTACATH PLACEMENT Right 02/16/2018   Procedure: INSERTION PORT-A-CATH;  Surgeon: [name], MD;  Location: [place];  Service: General;  Laterality: Right;   RE-EXCISION OF BREAST LUMPECTOMY Right 02/22/2018   Procedure: RE-EXCISION OF RIGHT  BREAST LUMPECTOMY;  Surgeon: [name], MD;  Location: [place];  Service: General;  Laterality: Right;   REPAIR VAGINAL CUFF N/A 02/07/2017   Procedure: REPAIR VAGINAL CUFF;  Surgeon: Ena Dawley, MD;  Location: [facility];  Service: Gynecology;  Laterality: N/A;   ROBOTIC ASSISTED TOTAL HYSTERECTOMY WITH SALPINGECTOMY Left 01/20/2017   Procedure: ROBOTIC ASSISTED TOTAL HYSTERECTOMY WITH SALPINGECTOMY;  Surgeon: [name], MD;  Location: [facility];  Service: Gynecology;  Laterality: Left;   TOTAL HIP ARTHROPLASTY Right 2022   total hip and partial femur, per pt     OB History     Gravida  2   Para  2   Term      Preterm      AB      Living         SAB      IAB      Ectopic      Multiple      Live Births              Family History  Problem Relation Age of Onset   Lung cancer Maternal Grandfather    Esophageal cancer Paternal Grandfather 59   Breast cancer Cousin 39    Social History   Tobacco Use   Smoking status: Never   Smokeless tobacco: Never  Vaping Use   Vaping Use: Former  Substance Use Topics   Alcohol use: Not Currently   Drug use: No    Home Medications Prior to Admission medications   Medication Sig Start Date End Date Taking? Authorizing Provider  acetaminophen (TYLENOL) 500 MG tablet Take 1 tablet (500 mg total) by mouth 3 (three) times daily as needed for mild pain (or Fever >/= 101).  Take with ibuprofen 400 mg 02/10/21   Magrinat, [name], MD  b complex vitamins capsule Take 1 capsule by mouth daily. 900 mg    [provider]  capecitabine (XELODA) 500 MG tablet Take 1,500 mg by mouth 2 (two) times daily. Takes daily for 21 days, then 7 days off, then repeat cycle 04/25/21   [provider]  Cholecalciferol (VITAMIN D) 125 MCG (5000 UT) CAPS Take 5,000 Units by mouth daily. 06/20/20   Magrinat, [name], MD  dexamethasone (DECADRON) 4 MG tablet Take 2 tablets (8 mg total) by mouth daily. Start the day after chemotherapy for 2 days. 04/15/21   Magrinat, [name], MD  ferrous sulfate 325 (65 FE) MG  tablet Take 325 mg by mouth daily with breakfast.    [provider]  fluconazole (DIFLUCAN) 100 MG tablet TAKE 1 TABLET BY MOUTH EVERY DAY Patient taking differently: Take 100 mg by mouth daily. 04/14/21   Magrinat, [name], MD  gabapentin (NEURONTIN) 300 MG capsule Take 1 capsule (300 mg total) by mouth 3 (three) times daily. 02/13/21   Magrinat, [name], MD  ibuprofen (ADVIL) 200 MG tablet Take 400 mg by mouth 3 (three) times daily as needed for mild pain (take with tylenol).    [provider]  mirtazapine (REMERON) 7.5 MG tablet Take 1 tablet (7.5 mg total) by mouth at bedtime. 05/03/21   Rosezetta Schlatter, MD  ondansetron (ZOFRAN ODT) 4 MG disintegrating tablet Take 1 tablet (4 mg total) by mouth See admin instructions. Dissolve 4 mg in the mouth three times a day and an additional 4 mg at bedtime as needed for nausea/vomiting 03/19/21   Magrinat, [name], MD  pantoprazole (PROTONIX) 40 MG tablet Take 40 mg by mouth daily. 02/06/21   [provider]  polyethylene glycol (MIRALAX / GLYCOLAX) 17 g packet Take 17 g by mouth daily as needed for moderate constipation. 01/21/21   Eugenie Filler, MD  pyridOXINE (VITAMIN B-6) 100 MG tablet Take 100 mg by mouth daily.    [provider]  sertraline (ZOLOFT) 25 MG tablet Take 1 tablet (25 mg  total) by mouth daily. 05/04/21   Rosezetta Schlatter, MD  TUKYSA 150 MG tablet Take 150 mg by mouth 2 (two) times daily. 02/26/21   [provider]  valACYclovir (VALTREX) 1000 MG tablet Take 1 tablet (1,000 mg total) by mouth 2 (two) times daily. Take one tablet twice a day for one week, then daily Patient taking differently: Take 1,000 mg by mouth daily. 04/15/21   Magrinat, [name], MD  prochlorperazine (COMPAZINE) 5 MG tablet Take 1 tablet (5 mg total) by mouth every 8 (eight) hours as needed for nausea or vomiting. 03/31/21 04/15/21  Magrinat, [name], MD    Allergies    Patient has no known allergies.  Review of Systems   Review of Systems  Constitutional:  Negative for chills and fever.  HENT:  Negative for ear pain and sore throat.   Eyes:  Negative for pain and visual disturbance.  Respiratory:  Negative for cough and shortness of breath.   Cardiovascular:  Negative for chest pain and palpitations.  Gastrointestinal:  Negative for abdominal pain and vomiting.  Genitourinary:  Negative for dysuria and hematuria.  Musculoskeletal:  Negative for arthralgias and back pain.  Skin:  Negative for color change and rash.  Neurological:  Negative for seizures and syncope.  Psychiatric/Behavioral:  Positive for dysphoric mood.   All other systems reviewed and are negative.  Physical Exam Updated Vital Signs BP 114/78   Pulse 91   Temp 99.6 F (37.6 C) (Oral)   Resp (!) 23   LMP 12/21/2016 (Exact Date)   SpO2 98%   Physical Exam Vitals and nursing note reviewed.  Constitutional:      General: She is not in acute distress.    Appearance: Normal appearance. She is well-developed.  HENT:     Head: Normocephalic and atraumatic.  Eyes:     Extraocular Movements: Extraocular movements intact.     Conjunctiva/sclera: Conjunctivae normal.     Pupils: Pupils are equal, round, and reactive to light.  Cardiovascular:     Rate and Rhythm: Normal rate and regular rhythm.  Heart  sounds: No murmur heard. Pulmonary:     Effort: Pulmonary effort is normal. No respiratory distress.     Breath sounds: Normal breath sounds.  Abdominal:     Palpations: Abdomen is soft.     Tenderness: There is no abdominal tenderness.  Musculoskeletal:        General: Normal range of motion.     Cervical back: Normal range of motion and neck supple.  Skin:    General: Skin is warm and dry.  Neurological:     General: No focal deficit present.     Mental Status: She is alert and oriented to person, place, and time.     Cranial Nerves: No cranial nerve deficit.     Sensory: No sensory deficit.     Motor: No weakness.    ED Results / Procedures / Treatments   Labs (all labs ordered are listed, but only abnormal results are displayed) Labs Reviewed  LACTIC ACID, PLASMA - Abnormal; Notable for the following components:      Result Value   Lactic Acid, Venous 3.3 (*)    All other components within normal limits  LACTIC ACID, PLASMA - Abnormal; Notable for the following components:   Lactic Acid, Venous 2.1 (*)    All other components within normal limits  URINALYSIS, ROUTINE W REFLEX MICROSCOPIC  CBC WITH DIFFERENTIAL/PLATELET  BASIC METABOLIC PANEL    EKG None  Radiology DG Chest 2 View  Result Date: 05/03/2021 CLINICAL DATA:  Shortness of breath. Weakness and fatigue. Breast cancer with active chemotherapy. EXAM: CHEST - 2 VIEW COMPARISON:  Radiograph 11/27/2020, CT 04/14/2021 FINDINGS: Right chest port with tip in the SVC. Nodular opacity in the right mid lung just inferior to the site of port, corresponds to nodular opacities on CT. The lungs are otherwise clear. No pulmonary edema, pleural effusion, or pneumothorax. Slight volume loss in the right hemithorax is stable from prior CT. Surgical clips in the right axilla. Kyphoplasty within T9 and T10. Similar compression fractures in the thoracic spine. Osseous metastatic disease with patchy areas of sclerosis, assessed on  recent CT. IMPRESSION: 1. Stable nodular consolidation in the right mid lung compared with recent CT. 2. No acute intrathoracic findings. 3. Osseous metastatic disease. Electronically Signed   By: Keith Rake M.D.   On: 05/03/2021 18:35    Procedures Procedures   Medications Ordered in ED Medications  0.9 %  sodium chloride infusion ( Intravenous New Bag/Given 05/03/21 2247)  sodium chloride 0.9 % bolus 1,000 mL (1,000 mLs Intravenous New Bag/Given 05/03/21 2248)    ED Course  I have reviewed the triage vital signs and the nursing notes.  Pertinent labs & imaging results that were available during my care of the patient were reviewed by me and considered in my medical decision making (see chart for details).    MDM Rules/Calculators/A&P                           Chest x-ray done here today nothing acute.  Does show the osseous metastasis.  Stable right middle lobe consolidation.  Since last CBC that I can find was at 6 in the morning.  White count was 1.3.  Hemoglobin was 12.3.  Platelet count 142K  As stated in the HPI.  We will go ahead and repeat CBC with differential and get basic metabolic panel.  And give IV fluids.  And if necessary will discuss with heme-onc.  Patient nontoxic  no acute distress.  Final Clinical Impression(s) / ED Diagnoses Final diagnoses:  Depression, unspecified depression type  Metastatic breast cancer ([place])  Leukopenia, unspecified type    Rx / DC Orders ED Discharge Orders     None        Fredia Sorrow, MD 05/03/21 2254  Addendum: Chart notes show a incomplete discharge summary by psychiatry.  It is possible that they intended patient to be discharged.  This will have to be clarified if patient does not require medical admission.      Fredia Sorrow, MD 05/03/21 2306

## 2021-05-03 NOTE — ED Triage Notes (Signed)
Sent via EMS from [facility], sent over for [place] of 900, WBC count was 1.3. Patient is alert and oriented, complains of increased weakness and fatigue.

## 2021-05-03 NOTE — ED Provider Notes (Signed)
Care assumed from Dr. Rogene Houston, patient with leukopenia, elevated lactic acid which improved with fluids. She has CBC pending.  WBC is 1.3 with 56 percent neutrophils.  I have discussed this with Dr. Julien Nordmann, on-call for hematology and oncology who recommends she get an injection of Granix.  Following this, she can safely be sent back to behavioral health with instructions to return to the emergency department should she develop a fever.  Results for orders placed or performed during the hospital encounter of 05/03/21  Lactic acid, plasma  Result Value Ref Range   Lactic Acid, Venous 3.3 (HH) 0.5 - 1.9 mmol/L  Lactic acid, plasma  Result Value Ref Range   Lactic Acid, Venous 2.1 (HH) 0.5 - 1.9 mmol/L  CBC with Differential/Platelet  Result Value Ref Range   WBC 1.3 (LL) 4.0 - 10.5 K/uL   RBC 3.10 (L) 3.87 - 5.11 MIL/uL   Hemoglobin 10.3 (L) 12.0 - 15.0 g/dL   HCT 31.5 (L) 36.0 - 46.0 %   MCV 101.6 (H) 80.0 - 100.0 fL   MCH 33.2 26.0 - 34.0 pg   MCHC 32.7 30.0 - 36.0 g/dL   RDW 22.4 (H) 11.5 - 15.5 %   Platelets 130 (L) 150 - 400 K/uL   Neutrophils Relative % 56 %   Band Neutrophils 3 %   Lymphocytes Relative 19 %   Monocytes Relative 13 %   Eosinophils Relative 7 %   Basophils Relative 2 %   WBC Morphology NORMAL    RBC Morphology NORMAL    Smear Review DONE    nRBC 2 (H) 0 /100 WBC   Metamyelocytes Relative NONE SEEN %   Myelocytes NONE SEEN %   Promyelocytes Relative NONE SEEN %   Blasts NONE SEEN %  Basic metabolic panel  Result Value Ref Range   Sodium 134 (L) 135 - 145 mmol/L   Potassium 3.0 (L) 3.5 - 5.1 mmol/L   Chloride 103 98 - 111 mmol/L   CO2 22 22 - 32 mmol/L   Glucose, Bld 130 (H) 70 - 99 mg/dL   BUN 11 6 - 20 mg/dL   Creatinine, Ser 0.52 0.44 - 1.00 mg/dL   Calcium 7.3 (L) 8.9 - 10.3 mg/dL   GFR, Estimated >60 >60 mL/min   Anion gap 9 5 - 15   DG Chest 2 View  Result Date: 05/03/2021 CLINICAL DATA:  Shortness of breath. Weakness and fatigue. Breast  cancer with active chemotherapy. EXAM: CHEST - 2 VIEW COMPARISON:  Radiograph 11/27/2020, CT 04/14/2021 FINDINGS: Right chest port with tip in the SVC. Nodular opacity in the right mid lung just inferior to the site of port, corresponds to nodular opacities on CT. The lungs are otherwise clear. No pulmonary edema, pleural effusion, or pneumothorax. Slight volume loss in the right hemithorax is stable from prior CT. Surgical clips in the right axilla. Kyphoplasty within T9 and T10. Similar compression fractures in the thoracic spine. Osseous metastatic disease with patchy areas of sclerosis, assessed on recent CT. IMPRESSION: 1. Stable nodular consolidation in the right mid lung compared with recent CT. 2. No acute intrathoracic findings. 3. Osseous metastatic disease. Electronically Signed   By: Keith Rake M.D.   On: 05/03/2021 18:35   CT Chest W Contrast  Result Date: 04/15/2021 CLINICAL DATA:  Breast cancer. Chemotherapy and radiation and 2 weeks ago. EXAM: CT CHEST WITH CONTRAST TECHNIQUE: Multidetector CT imaging of the chest was performed during intravenous contrast administration. CONTRAST:  68mL OMNIPAQUE IOHEXOL 350 MG/ML  SOLN COMPARISON:  11/30/2020. FINDINGS: Cardiovascular: Right IJ Port-A-Cath terminates in the right atrium. Heart size normal. Small pericardial effusion is new. Mediastinum/Nodes: Low internal jugular lymph nodes are not enlarged by CT size criteria. No pathologically enlarged mediastinal, hilar, internal mammary or axillary lymph nodes. Surgical clips in the right axilla. Esophagus is grossly unremarkable. Lungs/Pleura: Mild biapical pleuroparenchymal scarring. Nodular consolidation in the right middle lobe has progressed but is difficult to measure given adjacent volume loss. Surrounding peribronchovascular nodularity. Additional bilateral pulmonary nodules measure up to 1.5 cm in the right lower lobe (5/101), similar. No pleural fluid. Airway is unremarkable. Upper Abdomen:  New and enlarging heterogeneous lesions in the liver. Index lesion in the central aspect of the liver, spanning the left and right hepatic lobes, now measures 5.8 x 6.2 cm (2/128), previously 4.0 x 5.0 cm. Enlarged portal vein. Visualized portions of adrenal glands, kidneys, spleen, pancreas, stomach and bowel are grossly unremarkable. Musculoskeletal: Progressive mottled sclerosis throughout the visualized osseous structures. New pathologic fractures of the left second, sixth and eighth ribs. T9 and T10 vertebral body augmentations. New compression deformities involving T1, T2, T4, T6 and T7. Progressive compression deformities involving T11 and T12. L1 inferior endplate compression fracture and L2 superior endplate compression fracture, new. Pathologic manubrial fracture. IMPRESSION: 1. Interval progression of hepatic and osseous metastatic disease with multiple new pathologic fractures throughout the axial and appendicular skeleton. 2. Increasing nodular consolidation in the right middle lobe, also worrisome for some for disease progression 3. New small pericardial effusion. Electronically Signed   By: Lorin Picket M.D.   On: 20/04/711 19:75      Delora Fuel, MD 88/32/54 [phone]

## 2021-05-03 NOTE — ED Provider Notes (Addendum)
Emergency Medicine Provider Triage Evaluation Note  Monica [name] , a 50 y.o. female  was evaluated in triage.  Patient presents from [facility] with abnormal labs.  Patient has history of breast cancer, is currently undergoing chemotherapy.  Was over in [facility] for depressive episode.  Was sent to the emergency department for evaluation for leukopenia of 1.3 and ANC of 9000.  She is complaining of increased weakness and fatigue.  Review of Systems  Positive: Fatigue, weakness, nausea, diarrhea, decreased appetite Negative: Fevers, chills, chest pain, SOB, abdominal pain, dysuria  Physical Exam  LMP 12/21/2016 (Exact Date)  Gen:   Awake, no distress   Resp:  Normal effort  MSK:   Moves extremities without difficulty  Other:    Medical Decision Making  Medically screening exam initiated at 6:06 PM.  Appropriate orders placed.  Faige Corpening was informed that the remainder of the evaluation will be completed by another provider, this initial triage assessment does not replace that evaluation, and the importance of remaining in the ED until their evaluation is complete.     Estill Cotta 05/03/21 1810  6:29pm Received message from Dr. Earley Favor from behavioral health , who is requesting patient receive palliative consult while emergency department to discuss hospice.    Kateri Plummer, PA-C 05/03/21 1830    Jeanell Sparrow, DO 05/04/21 1350

## 2021-05-03 NOTE — Progress Notes (Signed)
Pt did not attend Orientation group.

## 2021-05-04 ENCOUNTER — Inpatient Hospital Stay (HOSPITAL_COMMUNITY)
Admission: AD | Admit: 2021-05-04 | Discharge: 2021-05-05 | DRG: 881 | Disposition: A | Discharge status: Still In Hospital | Payer: Medicaid Other | Source: Intra-hospital | Attending: Psychiatry | Admitting: Psychiatry

## 2021-05-04 ENCOUNTER — Other Ambulatory Visit: Payer: Self-pay

## 2021-05-04 ENCOUNTER — Encounter (HOSPITAL_COMMUNITY): Payer: Self-pay | Admitting: Psychiatry

## 2021-05-04 DIAGNOSIS — Z20822 Contact with and (suspected) exposure to covid-19: Secondary | ICD-10-CM | POA: Diagnosis present

## 2021-05-04 DIAGNOSIS — Z96641 Presence of right artificial hip joint: Secondary | ICD-10-CM | POA: Diagnosis present

## 2021-05-04 DIAGNOSIS — R5081 Fever presenting with conditions classified elsewhere: Secondary | ICD-10-CM | POA: Diagnosis not present

## 2021-05-04 DIAGNOSIS — Z79899 Other long term (current) drug therapy: Secondary | ICD-10-CM

## 2021-05-04 DIAGNOSIS — C7951 Secondary malignant neoplasm of bone: Secondary | ICD-10-CM | POA: Diagnosis present

## 2021-05-04 DIAGNOSIS — F332 Major depressive disorder, recurrent severe without psychotic features: Secondary | ICD-10-CM | POA: Diagnosis not present

## 2021-05-04 DIAGNOSIS — E876 Hypokalemia: Secondary | ICD-10-CM | POA: Diagnosis not present

## 2021-05-04 DIAGNOSIS — G4700 Insomnia, unspecified: Secondary | ICD-10-CM | POA: Diagnosis present

## 2021-05-04 DIAGNOSIS — Z170 Estrogen receptor positive status [ER+]: Secondary | ICD-10-CM | POA: Diagnosis not present

## 2021-05-04 DIAGNOSIS — Z800 Family history of malignant neoplasm of digestive organs: Secondary | ICD-10-CM | POA: Diagnosis not present

## 2021-05-04 DIAGNOSIS — Z853 Personal history of malignant neoplasm of breast: Secondary | ICD-10-CM

## 2021-05-04 DIAGNOSIS — Z9221 Personal history of antineoplastic chemotherapy: Secondary | ICD-10-CM | POA: Diagnosis not present

## 2021-05-04 DIAGNOSIS — D709 Neutropenia, unspecified: Secondary | ICD-10-CM | POA: Diagnosis not present

## 2021-05-04 DIAGNOSIS — R197 Diarrhea, unspecified: Secondary | ICD-10-CM | POA: Diagnosis present

## 2021-05-04 DIAGNOSIS — K219 Gastro-esophageal reflux disease without esophagitis: Secondary | ICD-10-CM | POA: Diagnosis present

## 2021-05-04 DIAGNOSIS — F329 Major depressive disorder, single episode, unspecified: Principal | ICD-10-CM | POA: Diagnosis present

## 2021-05-04 DIAGNOSIS — Z90710 Acquired absence of both cervix and uterus: Secondary | ICD-10-CM

## 2021-05-04 DIAGNOSIS — E878 Other disorders of electrolyte and fluid balance, not elsewhere classified: Secondary | ICD-10-CM | POA: Diagnosis not present

## 2021-05-04 DIAGNOSIS — Z801 Family history of malignant neoplasm of trachea, bronchus and lung: Secondary | ICD-10-CM | POA: Diagnosis not present

## 2021-05-04 DIAGNOSIS — D72819 Decreased white blood cell count, unspecified: Secondary | ICD-10-CM | POA: Diagnosis present

## 2021-05-04 DIAGNOSIS — T380X5A Adverse effect of glucocorticoids and synthetic analogues, initial encounter: Secondary | ICD-10-CM | POA: Diagnosis present

## 2021-05-04 DIAGNOSIS — Z803 Family history of malignant neoplasm of breast: Secondary | ICD-10-CM | POA: Diagnosis not present

## 2021-05-04 DIAGNOSIS — Y92009 Unspecified place in unspecified non-institutional (private) residence as the place of occurrence of the external cause: Secondary | ICD-10-CM

## 2021-05-04 DIAGNOSIS — C7800 Secondary malignant neoplasm of unspecified lung: Secondary | ICD-10-CM | POA: Diagnosis present

## 2021-05-04 DIAGNOSIS — Z923 Personal history of irradiation: Secondary | ICD-10-CM | POA: Diagnosis not present

## 2021-05-04 DIAGNOSIS — R45851 Suicidal ideations: Secondary | ICD-10-CM | POA: Diagnosis present

## 2021-05-04 DIAGNOSIS — Z85841 Personal history of malignant neoplasm of brain: Secondary | ICD-10-CM | POA: Diagnosis not present

## 2021-05-04 DIAGNOSIS — C50919 Malignant neoplasm of unspecified site of unspecified female breast: Secondary | ICD-10-CM | POA: Diagnosis not present

## 2021-05-04 DIAGNOSIS — F323 Major depressive disorder, single episode, severe with psychotic features: Secondary | ICD-10-CM | POA: Diagnosis not present

## 2021-05-04 DIAGNOSIS — C50211 Malignant neoplasm of upper-inner quadrant of right female breast: Secondary | ICD-10-CM | POA: Diagnosis not present

## 2021-05-04 DIAGNOSIS — R112 Nausea with vomiting, unspecified: Secondary | ICD-10-CM | POA: Diagnosis not present

## 2021-05-04 DIAGNOSIS — F32A Depression, unspecified: Secondary | ICD-10-CM | POA: Diagnosis not present

## 2021-05-04 DIAGNOSIS — E440 Moderate protein-calorie malnutrition: Secondary | ICD-10-CM | POA: Diagnosis not present

## 2021-05-04 LAB — URINALYSIS, ROUTINE W REFLEX MICROSCOPIC
Bilirubin Urine: NEGATIVE
Glucose, UA: NEGATIVE mg/dL
Hgb urine dipstick: NEGATIVE
Ketones, ur: NEGATIVE mg/dL
Leukocytes,Ua: NEGATIVE
Nitrite: POSITIVE — AB
Protein, ur: NEGATIVE mg/dL
Specific Gravity, Urine: 1.016 (ref 1.005–1.030)
pH: 5 (ref 5.0–8.0)

## 2021-05-04 MED ORDER — HYDROXYZINE HCL 25 MG PO TABS
25.0000 mg | ORAL_TABLET | Freq: Three times a day (TID) | ORAL | Status: DC | PRN
  Administered 2021-05-04: 25 mg via ORAL
  Filled 2021-05-04: qty 1

## 2021-05-04 MED ORDER — PANTOPRAZOLE SODIUM 40 MG PO TBEC
40.0000 mg | DELAYED_RELEASE_TABLET | Freq: Every day | ORAL | Status: DC
  Administered 2021-05-04 – 2021-05-05 (×2): 40 mg via ORAL
  Filled 2021-05-04 (×4): qty 1

## 2021-05-04 MED ORDER — IBUPROFEN 200 MG PO TABS: 400.0000 mg | ORAL_TABLET | Freq: Four times a day (QID) | ORAL | Status: DC | PRN

## 2021-05-04 MED ORDER — VALACYCLOVIR HCL 500 MG PO TABS
1000.0000 mg | ORAL_TABLET | Freq: Every day | ORAL | Status: DC
  Administered 2021-05-04 – 2021-05-05 (×2): 1000 mg via ORAL
  Filled 2021-05-04 (×4): qty 2

## 2021-05-04 MED ORDER — TRAZODONE HCL 100 MG PO TABS
50.0000 mg | ORAL_TABLET | Freq: Every evening | ORAL | Status: DC | PRN
  Filled 2021-05-04: qty 1

## 2021-05-04 MED ORDER — ACETAMINOPHEN 325 MG PO TABS
650.0000 mg | ORAL_TABLET | Freq: Four times a day (QID) | ORAL | Status: DC | PRN
  Filled 2021-05-04: qty 2

## 2021-05-04 MED ORDER — TBO-FILGRASTIM 300 MCG/0.5ML ~~LOC~~ SOSY
300.0000 ug | PREFILLED_SYRINGE | Freq: Once | SUBCUTANEOUS | Status: AC
  Administered 2021-05-04: 300 ug via SUBCUTANEOUS
  Filled 2021-05-04: qty 0.5

## 2021-05-04 MED ORDER — MIRTAZAPINE 7.5 MG PO TABS
7.5000 mg | ORAL_TABLET | Freq: Every day | ORAL | Status: DC
  Administered 2021-05-04: 7.5 mg via ORAL
  Filled 2021-05-04 (×2): qty 1

## 2021-05-04 MED ORDER — FERROUS SULFATE 325 (65 FE) MG PO TABS
325.0000 mg | ORAL_TABLET | Freq: Every day | ORAL | Status: DC
  Administered 2021-05-05: 325 mg via ORAL
  Filled 2021-05-04 (×2): qty 1

## 2021-05-04 MED ORDER — LOPERAMIDE HCL 2 MG PO CAPS
2.0000 mg | ORAL_CAPSULE | ORAL | Status: DC | PRN
  Administered 2021-05-04 – 2021-05-05 (×2): 2 mg via ORAL
  Filled 2021-05-04 (×2): qty 1

## 2021-05-04 MED ORDER — POLYETHYLENE GLYCOL 3350 17 G PO PACK: 17.0000 g | PACK | Freq: Every day | ORAL | Status: DC | PRN

## 2021-05-04 MED ORDER — SERTRALINE HCL 50 MG PO TABS
25.0000 mg | ORAL_TABLET | Freq: Every day | ORAL | Status: DC
  Administered 2021-05-04 – 2021-05-05 (×2): 25 mg via ORAL
  Filled 2021-05-04 (×4): qty 1

## 2021-05-04 MED ORDER — ONDANSETRON 4 MG PO TBDP
4.0000 mg | ORAL_TABLET | Freq: Three times a day (TID) | ORAL | Status: DC | PRN
  Administered 2021-05-04 – 2021-05-05 (×2): 4 mg via ORAL
  Filled 2021-05-04 (×3): qty 1

## 2021-05-04 MED ORDER — ENSURE ENLIVE PO LIQD
237.0000 mL | Freq: Two times a day (BID) | ORAL | Status: DC
  Administered 2021-05-04: 237 mL via ORAL
  Filled 2021-05-04 (×4): qty 237

## 2021-05-04 MED ORDER — HEPARIN SOD (PORK) LOCK FLUSH 100 UNIT/ML IV SOLN
500.0000 [IU] | Freq: Once | INTRAVENOUS | Status: DC
  Filled 2021-05-04: qty 5

## 2021-05-04 MED ORDER — MAGNESIUM HYDROXIDE 400 MG/5ML PO SUSP: 30.0000 mL | Freq: Every day | ORAL | Status: DC | PRN

## 2021-05-04 MED ORDER — VITAMIN B-6 100 MG PO TABS
100.0000 mg | ORAL_TABLET | Freq: Every day | ORAL | Status: DC
  Administered 2021-05-04: 100 mg via ORAL
  Filled 2021-05-04 (×5): qty 1

## 2021-05-04 MED ORDER — ALUM & MAG HYDROXIDE-SIMETH 200-200-20 MG/5ML PO SUSP: 30.0000 mL | ORAL | Status: DC | PRN

## 2021-05-04 MED ORDER — TUCATINIB 150 MG PO TABS
150.0000 mg | ORAL_TABLET | Freq: Two times a day (BID) | ORAL | Status: DC
  Administered 2021-05-04 – 2021-05-05 (×2): 150 mg via ORAL

## 2021-05-04 MED ORDER — GABAPENTIN 300 MG PO CAPS
300.0000 mg | ORAL_CAPSULE | Freq: Three times a day (TID) | ORAL | Status: DC
  Administered 2021-05-04 – 2021-05-05 (×2): 300 mg via ORAL
  Filled 2021-05-04 (×5): qty 1

## 2021-05-04 NOTE — Progress Notes (Signed)
Patient returned to [facility] from [facility] ED.  Patient had been patient at [facility] and sent to [facility] ED for critical lab values related to cancer diagnosis.  Patient stated she does have SI thoughts off/on, contracts for safety.  Denied HI.  Denied A/V hallucinations.  Mickel Baas at [place] on [address].  Has medicaid.  R hip surgery few months ago.  R breast cancer with PIC line upper R chest.  Has using walker.  Denied all abuse.  Has some college.  Son Danette Weinfeld is her POA.  Her daughter also helps her.  Rated anxiety 8, depression and hopeless 10.  Denied tobacco, alcohol, THC and all drug use.  SI off/on, contracts for safety, no plan.  Denied HI.  Denied A/V hallucinations. High fall risk.  Patient given food/drink.  Patient has been very cooperative and pleasant.

## 2021-05-04 NOTE — Progress Notes (Addendum)
[facility] MD Progress Note  05/04/2021 2:13 PM Monica [name]  MRN:  [id number] Subjective:  Monica [name] is a 50 year old female with no prior psychiatric history and a medical history of Stage IV, metastatic triple positive breast cancer admitted voluntarily for worsening depression and SI in the context of the recurrence and metastatic nature of cancer. Patient has not felt ready for outpatient palliative care as of 10/10, per oncology team.   On Assessment Today (10/23): Case was discussed in the multidisciplinary team. [facility] was reviewed and patient was compliant with medications. Patient seen, assessed, and discussed with attending Dr. Berdine [name].  Patient states that she is doing okay today.  Patient states that she is trying to get some rest when assessed this afternoon.  Patient states that she has not slept well for the past 24 hours as well as unable to get her meds for past 24 hours.  Patient denies any somatic complaints currently.  Patient does mention that she has had diarrhea for past few days and requesting Imodium for this.  Discussed with patient that MiraLAX will be set up to be as needed.  Patient denies present SI/HI/AVH.  Patient still endorses feeling depressed but did not express particular concerns of this beyond her needing to rest today.  Encourage patient to be active during the day starting tomorrow as patient will likely need to rest today as she has not been sleeping for 24 hours.   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 The patient is separated from her husband, Monica [name]. Current partner lives in [place]. The patient's son Monica [name] is in the [employer] and is stationed in [place] (for 3 years beginning January 2020). He plans on working in the railroad industry after he returns. The patient's daughter Monica [name], age 70, works at Engineer, maintenance.  Past Medical History:  Past Medical History:  Diagnosis Date   Arthritis    lower back   Cancer ([place])    right breast cancer   Family history of breast cancer    Personal history of chemotherapy    Personal history of radiation therapy     Past Surgical History:  Procedure Laterality Date   ABDOMINAL HYSTERECTOMY     APPLICATION OF CRANIAL NAVIGATION N/A 12/06/2020   Procedure: APPLICATION OF CRANIAL NAVIGATION;  Surgeon: Monica Part, MD;  Location: [place];  Service: Neurosurgery;  Laterality: N/A;  RM 20   BREAST LUMPECTOMY Right    BREAST LUMPECTOMY WITH RADIOACTIVE SEED AND SENTINEL LYMPH NODE BIOPSY Right 02/16/2018   Procedure: RIGHT BREAST LUMPECTOMY WITH RADIOACTIVE SEED AND SENTINEL LYMPH NODE BIOPSY;  Surgeon: [name], MD;  Location: [place];  Service: General;  Laterality: Right;   CRANIOTOMY Right 12/06/2020   Procedure: Right sided awake craniotomy for tumor resection;  Surgeon: Monica Part, MD;  Location: [place];  Service: Neurosurgery;  Laterality: Right;   HYMENECTOMY     IR IMAGING GUIDED PORT INSERTION  03/12/2021   IR US GUIDE BX ASP/DRAIN  12/02/2020   KYPHOPLASTY  N/A 03/07/2021   Procedure: Thoracic nine, Thoracic ten KYPHOPLASTY;  Surgeon: Monica Part, MD;  Location: [place];  Service: Neurosurgery;  Laterality: N/A;   PORTACATH PLACEMENT Right 02/16/2018   Procedure: INSERTION PORT-A-CATH;  Surgeon: [name], MD;  Location: [place];  Service: General;  Laterality: Right;   RE-EXCISION OF  BREAST LUMPECTOMY Right 02/22/2018   Procedure: RE-EXCISION OF RIGHT  BREAST LUMPECTOMY;  Surgeon: [name], MD;  Location: [place];  Service: General;  Laterality: Right;   REPAIR VAGINAL CUFF N/A 02/07/2017   Procedure: REPAIR VAGINAL CUFF;  Surgeon: Ena Dawley, MD;  Location: [facility];  Service: Gynecology;  Laterality: N/A;   ROBOTIC ASSISTED TOTAL HYSTERECTOMY WITH SALPINGECTOMY Left 01/20/2017   Procedure: ROBOTIC ASSISTED TOTAL HYSTERECTOMY WITH SALPINGECTOMY;  Surgeon: [name], MD;  Location: [facility];  Service: Gynecology;  Laterality: Left;   TOTAL HIP ARTHROPLASTY Right 2022   total hip and partial femur, per pt   Family History:  Family History  Problem Relation Age of Onset   Lung cancer Maternal Grandfather    Esophageal cancer Paternal Grandfather 17   Breast cancer Cousin 66   Family Psychiatric  History: See H&P Social History:  Social History   Substance and Sexual Activity  Alcohol Use Not Currently     Social History   Substance and Sexual Activity  Drug Use No    Social History   Socioeconomic History   Marital status: Legally Separated    Spouse name: Not on file   Number of children: Not on file   Years of education: Not on file   Highest education level: Not on file  Occupational History   Not on file  Tobacco Use   Smoking status: Never   Smokeless tobacco: Never  Vaping Use   Vaping Use: Former  Substance and Sexual Activity   Alcohol use: Not Currently   Drug use: No   Sexual activity: Not Currently    Birth control/protection: Surgical  Other Topics Concern   Not on file  Social History Narrative   Not on file   Social Determinants of Health   Financial Resource Strain: Not on file  Food Insecurity: Not on file  Transportation Needs: Not on file  Physical Activity: Not on file  Stress: Not on file  Social Connections: Not on file   Additional Social History:      Sleep: Good  Appetite:   Poor  Current Medications: Current Facility-Administered Medications  Medication Dose Route Frequency Provider Last Rate Last Admin   acetaminophen (TYLENOL) tablet 650 mg  650 mg Oral Q6H PRN France Ravens, MD       alum & mag hydroxide-simeth (MAALOX/MYLANTA) 200-200-20 MG/5ML suspension 30 mL  30 mL Oral Q4H PRN France Ravens, MD       feeding supplement (ENSURE ENLIVE / ENSURE PLUS) liquid 237 mL  237 mL Oral BID BM France Ravens, MD       [START ON 05/05/2021] ferrous sulfate tablet 325 mg  325 mg Oral Q breakfast France Ravens, MD       gabapentin (NEURONTIN) capsule 300 mg  300 mg Oral TID France Ravens, MD       hydrOXYzine (ATARAX/VISTARIL) tablet 25 mg  25 mg Oral TID PRN France Ravens, MD       loperamide (IMODIUM) capsule 2 mg  2 mg Oral PRN France Ravens, MD       magnesium hydroxide (MILK OF MAGNESIA) suspension 30 mL  30 mL Oral Daily PRN France Ravens, MD       mirtazapine (REMERON) tablet 7.5 mg  7.5 mg Oral QHS France Ravens, MD       pantoprazole (PROTONIX) EC tablet 40 mg  40 mg Oral Daily France Ravens, MD       [START ON 05/05/2021] polyethylene glycol (MIRALAX / GLYCOLAX) packet 17 g  17 g Oral Daily PRN France Ravens, MD       pyridOXINE (VITAMIN B-6) tablet 100 mg  100 mg Oral Daily France Ravens, MD       sertraline (ZOLOFT) tablet 25 mg  25 mg Oral Daily France Ravens, MD       traZODone (DESYREL) tablet 50 mg  50 mg Oral QHS PRN France Ravens, MD       valACYclovir Estell Harpin) tablet 1,000 mg  1,000 mg Oral Daily France Ravens, MD       Facility-Administered Medications Ordered in Other Encounters  Medication Dose Route Frequency Provider Last Rate Last Admin   sodium chloride flush (NS) 0.9 % injection 10 mL  10 mL Intracatheter Once Magrinat, [name], MD        Lab Results:  Results for orders placed or performed during the hospital encounter of 05/03/21 (from the past 48 hour(s))  Lactic acid, plasma     Status: Abnormal   Collection Time: 05/03/21  6:32 PM  Result Value Ref Range   Lactic Acid,  Venous 3.3 (HH) 0.5 - 1.9 mmol/L    Comment: CRITICAL RESULT CALLED TO, READ BACK BY AND VERIFIED WITH: [place] J '@1957'  BY BATTLET ON HOLD >10 MINS  Performed at [facility], [place] [address], [place], [place] [address]   Lactic acid, plasma     Status: Abnormal   Collection Time: 05/03/21  8:08 PM  Result Value Ref Range   Lactic Acid, Venous 2.1 (HH) 0.5 - 1.9 mmol/L    Comment: CRITICAL VALUE NOTED.  VALUE IS CONSISTENT WITH PREVIOUSLY REPORTED AND CALLED VALUE. Performed at [facility], [place] [address] [place], [place] [address]   CBC with Differential/Platelet     Status: Abnormal   Collection Time: 05/03/21 10:23 PM  Result Value Ref Range   WBC 1.3 (LL) 4.0 - 10.5 K/uL    Comment: REPEATED TO VERIFY CRITICAL RESULT CALLED TO, READ BACK BY AND VERIFIED WITH: LOWDERMILK,J. 05/03/21 '@23' :11 BY SEEL,M.    RBC 3.10 (L) 3.87 - 5.11 MIL/uL   Hemoglobin 10.3 (L) 12.0 - 15.0 g/dL   HCT 31.5 (L) 36.0 - 46.0 %   MCV 101.6 (H) 80.0 - 100.0 fL   MCH 33.2 26.0 - 34.0 pg   MCHC 32.7 30.0 - 36.0 g/dL   RDW 22.4 (H) 11.5 - 15.5 %   Platelets 130 (L) 150 - 400 K/uL   Neutrophils Relative % 56 %   Band Neutrophils 3 %   Lymphocytes Relative 19 %   Monocytes Relative 13 %   Eosinophils Relative 7 %   Basophils Relative 2 %   WBC Morphology NORMAL    RBC Morphology NORMAL    Smear Review DONE    nRBC 2 (H) 0 /100 WBC   Metamyelocytes Relative NONE SEEN %   Myelocytes NONE SEEN %   Promyelocytes Relative NONE SEEN %   Blasts NONE SEEN %    Comment: Performed at [facility], [place] [address], [place], [place] [address]  Basic metabolic panel     Status: Abnormal  Collection Time: 05/03/21 10:23 PM  Result Value Ref Range   Sodium 134 (L) 135 - 145 mmol/L   Potassium 3.0 (L) 3.5 - 5.1 mmol/L   Chloride 103 98 - 111 mmol/L   CO2 22 22 - 32 mmol/L   Glucose, Bld 130 (H) 70 - 99 mg/dL    Comment: Glucose reference range  applies only to samples taken after fasting for at least 8 hours.   BUN 11 6 - 20 mg/dL   Creatinine, Ser 0.52 0.44 - 1.00 mg/dL   Calcium 7.3 (L) 8.9 - 10.3 mg/dL   GFR, Estimated >60 >60 mL/min    Comment: (NOTE) Calculated using the CKD-EPI Creatinine Equation (2021)    Anion gap 9 5 - 15    Comment: Performed at [name], [place] [address] [place], [place] [address]    Blood Alcohol level:  Lab Results  Component Value Date   ETH <10 44/62/8638    Metabolic Disorder Labs: Lab Results  Component Value Date   HGBA1C 6.1 (H) 05/02/2021   MPG 128.37 05/02/2021   No results found for: PROLACTIN Lab Results  Component Value Date   CHOL 177 05/02/2021   TRIG 193 (H) 05/02/2021   HDL 65 05/02/2021   CHOLHDL 2.7 05/02/2021   VLDL 39 05/02/2021   LDLCALC 73 05/02/2021    Physical Findings:  Musculoskeletal: Strength & Muscle Tone: decreased Gait & Station: unsteady, limited by pain s/p right hip replacement Patient leans: Left  Psychiatric Specialty Exam:  Presentation  General Appearance: Appropriate for Environment; Casual; Fairly Groomed  Eye Contact:Good  Speech:Clear and Coherent; Normal Rate  Speech Volume:Normal  Handedness:Right   Mood and Affect  Mood:Anxious; Depressed; Dysphoric  Affect:Flat; Congruent   Thought Process  Thought Processes:Coherent; Linear  Descriptions of Associations:Intact  Orientation:Full (Time, Place and Person)  Thought Content:Logical  History of Schizophrenia/Schizoaffective disorder:No data recorded Duration of Psychotic Symptoms:No data recorded Hallucinations:No data recorded  Ideas of Reference:None  Suicidal Thoughts:No data recorded  Homicidal Thoughts:No data recorded   Sensorium  Memory:Immediate Good; Recent Good; Remote Good  Judgment:Fair  Insight:Fair   Executive Functions  Concentration:Poor  Attention Span:Fair  [place]   Psychomotor Activity  Psychomotor Activity:No data recorded   Assets  Assets:Communication Skills; Desire for Improvement; Financial Resources/Insurance; Housing; Leisure Time; Physical Health   Sleep  Sleep:No data recorded    Physical Exam: Physical Exam Vitals and nursing note reviewed.  Constitutional:      General: She is not in acute distress.    Appearance: She is not toxic-appearing.  HENT:     Head: Normocephalic.     Comments: Hair cut s/p chemotherapy and brain surgery Pulmonary:     Effort: Pulmonary effort is normal.  Skin:    General: Skin is warm and dry.  Neurological:     General: No focal deficit present.     Mental Status: She is alert and oriented to person, place, and time.     Motor: Weakness present.     Gait: Gait abnormal.     Comments: Gait limited by pain 2/2 R hip replacement   Review of Systems  Constitutional:  Positive for malaise/fatigue and weight loss. Negative for diaphoresis.  HENT:  Negative for congestion.   Eyes:        Worsening vision s/p brain surgery  Respiratory:  Negative for cough and shortness of breath.   Cardiovascular:  Negative for chest pain.  Gastrointestinal:  Negative for abdominal pain, nausea and vomiting.  Musculoskeletal:  Positive for myalgias.  Neurological:  Positive for weakness. Negative for seizures and headaches.  Blood pressure 124/61, pulse (!) 102, temperature 98.5 F (36.9 C), temperature source Oral, resp. rate 18, last menstrual period 12/21/2016, SpO2 100 %. There is no height or weight on file to calculate BMI.   Treatment Plan Summary: Daily contact with patient to assess and evaluate symptoms and progress in treatment and Medication management  MDD-Single episode w/ melancholic features  Suicidal ideation Poor appetite -Per patient preference, home Duloxetine discontinued -Zoloft 25 mg for MDD -Continue Remeron 7.5 mg for depression, insomnia, and  poor appetite.  3 drug interaction checkers assessed for potential rxn b/w Zoloft and Tucatinib (no interactions) and Remeron and Tucatinib (Tucatinib is a CYP3A4 inhibitor which may increase the serum amount of mirtazapine). -Initiate Ensure supplements with each meal -Continue home Neurontin 300 mg TID   Medical Management Covid negative CMP: K+ 3.4, Ca2+ 7.5, ALT 49, Alk Phos 141 CBC: unremarkable EtOH: <10 UDS: To be collected TSH: 2.954 A1C: 6.1% Lipids:  TG 193 -CBC and BMP ordered for tomorrow to trend labs  Metastatic breast Cancer, triple positive Mouth sores 2/2 steroid tx -Continue home Tucatinib 150 mg BID -Continue home Valtrex 1 g daily -Continue Pyridoxine 100 mg daily -Initiate bowel regimen- Miralax 17 g daily     GERD -Continue home Protonix 40 mg   Leukopenia WBC 1.3 (down from 2.4 on 10/20) - Differential pending - Per oncology, if ANC is less than 1000, patient will be transferred to ED.  Continue PRN's: Tylenol, Maalox, Atarax, Milk of Magnesia, Trazodone    France Ravens, MD 05/04/2021, 2:13 PM

## 2021-05-04 NOTE — BHH Group Notes (Signed)
Psychoeducational Group Note  Date: 05-04-21 Time:  1300  Group Topic/Focus:  Making Healthy Choices:   The focus of this group is to help patients identify negative/unhealthy choices they were using prior to admission and identify positive/healthier coping strategies to replace them upon discharge.In this group, patients started asking about the brain and how the brain works with and how the chemicals work for those who use substances, the pros and cons of saboxone.  Participation Level:  Did not attend the group  Paulino Rily

## 2021-05-04 NOTE — BHH Suicide Risk Assessment (Signed)
[place] INPATIENT:  Family/Significant Other Suicide Prevention Education  Suicide Prevention Education:  Contact Attempts: mother [name] [phone], (name of family member/significant other) has been identified by the patient as the family member/significant other with whom the patient will be residing, and identified as the person(s) who will aid the patient in the event of a mental health crisis.  With written consent from the patient, two attempts were made to provide suicide prevention education, prior to and/or following the patient's discharge.  We were unsuccessful in providing suicide prevention education.  A suicide education pamphlet was given to the patient to share with family/significant other.  Date and time of first attempt:05/04/2021  /2:40 PM  HIPAA-compliant voicemail left asking for a call back Date and time of second attempt:  CSW team to continue efforts  Monica [name] 05/04/2021, 2:40 PM

## 2021-05-04 NOTE — BHH Group Notes (Signed)
Adult Psychoeducational Group Not Date:  05/04/2021 Time:  0900-1045 Group Topic/Focus: PROGRESSIVE RELAXATION. A group where deep breathing is taught and tensing and relaxation muscle groups is used. Imagery is used as well.  Pts are asked to imagine 3 pillars that hold them up when they are not able to hold themselves up.  Participation Level:  Did not attend   Paulino Rily

## 2021-05-04 NOTE — Progress Notes (Addendum)
PATIENT STATED SHE TAKES MEDICATION TUKYSA AT 2000 AND 0800 DAILY PER MD ORDERS.  PATIENT STATED SHE USUALLY TAKES IBUPROFEN FOR PAIN AS NEEDED.  PLEASE DISCUSS PATIENT'S MEDICATIONS WITH HER.  PATIENT STATED SHE ALSO NEEDS ZOFRAN FOR NAUSEA.

## 2021-05-04 NOTE — Progress Notes (Signed)
[place] Group Notes:  (Nursing/MHT/Case Management/Adjunct)  Date:  05/04/2021  Time:  2015  Type of Therapy:   wrap up group  Participation Level:  Active  Participation Quality:  Appropriate, Attentive, Sharing, and Supportive  Affect:  Depressed  Cognitive:  Alert  Insight:  Improving  Engagement in Group:   Attentive  Modes of Intervention:  Clarification, Education, and Support  Summary of Progress/Problems: Positive thinking and self-care were discussed.   Winfield Rast S 05/04/2021, 9:04 PM

## 2021-05-04 NOTE — Tx Team (Signed)
Initial Treatment Plan 05/04/2021 7:17 PM [name] MSX:[id number]    PATIENT STRESSORS: Financial difficulties   Health problems     PATIENT STRENGTHS: Ability for insight  Average or above average intelligence  Communication skills  Supportive family/friends    PATIENT IDENTIFIED PROBLEMS:  Suicidal ideation - intermittent     Increased depression    Stage 4 Cancer             DISCHARGE CRITERIA:  Adequate post-discharge living arrangements Improved stabilization in mood, thinking, and/or behavior Verbal commitment to aftercare and medication compliance  PRELIMINARY DISCHARGE PLAN: Outpatient therapy Return to previous living arrangement  PATIENT/FAMILY INVOLVEMENT: This treatment plan has been presented to and reviewed with the patient, Monica HurychThe patient has been given the opportunity to ask questions and make suggestions.  Waymond Cera, RN 05/04/2021, 7:17 PM

## 2021-05-04 NOTE — Plan of Care (Signed)
Patient was sent to [facility], ED for WBC 1.3 and ANC 900.  Patient has been medically cleared to return to [facility].  Patient was resumed on medications that she had previously been on while at [facility].  Please reference H&P from 05/02/2021.  Patient was at [facility], ED for less than 24 hours.  -France Ravens, MD PGY1 Psychiatry Resident

## 2021-05-05 ENCOUNTER — Encounter: Payer: Self-pay | Admitting: Oncology

## 2021-05-05 ENCOUNTER — Inpatient Hospital Stay (HOSPITAL_COMMUNITY)
Admission: AD | Admit: 2021-05-05 | Discharge: 2021-05-11 | DRG: 809 | Disposition: A | Discharge status: Home / Self Care | Payer: Medicaid Other | Source: Intra-hospital | Attending: Internal Medicine | Admitting: Internal Medicine

## 2021-05-05 ENCOUNTER — Encounter (HOSPITAL_COMMUNITY): Payer: Self-pay | Admitting: Internal Medicine

## 2021-05-05 ENCOUNTER — Other Ambulatory Visit: Payer: Self-pay | Admitting: *Deleted

## 2021-05-05 ENCOUNTER — Inpatient Hospital Stay (HOSPITAL_COMMUNITY): Payer: Medicaid Other

## 2021-05-05 ENCOUNTER — Telehealth: Payer: Self-pay | Admitting: *Deleted

## 2021-05-05 DIAGNOSIS — M1990 Unspecified osteoarthritis, unspecified site: Secondary | ICD-10-CM | POA: Diagnosis present

## 2021-05-05 DIAGNOSIS — C787 Secondary malignant neoplasm of liver and intrahepatic bile duct: Secondary | ICD-10-CM | POA: Diagnosis present

## 2021-05-05 DIAGNOSIS — R112 Nausea with vomiting, unspecified: Secondary | ICD-10-CM | POA: Diagnosis present

## 2021-05-05 DIAGNOSIS — Z79899 Other long term (current) drug therapy: Secondary | ICD-10-CM

## 2021-05-05 DIAGNOSIS — F332 Major depressive disorder, recurrent severe without psychotic features: Secondary | ICD-10-CM | POA: Diagnosis not present

## 2021-05-05 DIAGNOSIS — F329 Major depressive disorder, single episode, unspecified: Secondary | ICD-10-CM | POA: Diagnosis present

## 2021-05-05 DIAGNOSIS — R5081 Fever presenting with conditions classified elsewhere: Secondary | ICD-10-CM | POA: Diagnosis present

## 2021-05-05 DIAGNOSIS — Z96641 Presence of right artificial hip joint: Secondary | ICD-10-CM | POA: Diagnosis present

## 2021-05-05 DIAGNOSIS — C7800 Secondary malignant neoplasm of unspecified lung: Secondary | ICD-10-CM | POA: Diagnosis present

## 2021-05-05 DIAGNOSIS — Z20822 Contact with and (suspected) exposure to covid-19: Secondary | ICD-10-CM | POA: Diagnosis present

## 2021-05-05 DIAGNOSIS — B379 Candidiasis, unspecified: Secondary | ICD-10-CM | POA: Diagnosis present

## 2021-05-05 DIAGNOSIS — E440 Moderate protein-calorie malnutrition: Secondary | ICD-10-CM | POA: Diagnosis present

## 2021-05-05 DIAGNOSIS — R45851 Suicidal ideations: Secondary | ICD-10-CM | POA: Diagnosis present

## 2021-05-05 DIAGNOSIS — Z170 Estrogen receptor positive status [ER+]: Secondary | ICD-10-CM

## 2021-05-05 DIAGNOSIS — C50919 Malignant neoplasm of unspecified site of unspecified female breast: Secondary | ICD-10-CM | POA: Diagnosis not present

## 2021-05-05 DIAGNOSIS — F323 Major depressive disorder, single episode, severe with psychotic features: Secondary | ICD-10-CM | POA: Diagnosis not present

## 2021-05-05 DIAGNOSIS — R197 Diarrhea, unspecified: Secondary | ICD-10-CM | POA: Diagnosis present

## 2021-05-05 DIAGNOSIS — D709 Neutropenia, unspecified: Secondary | ICD-10-CM | POA: Diagnosis present

## 2021-05-05 DIAGNOSIS — M8458XA Pathological fracture in neoplastic disease, other specified site, initial encounter for fracture: Secondary | ICD-10-CM | POA: Diagnosis present

## 2021-05-05 DIAGNOSIS — M84551A Pathological fracture in neoplastic disease, right femur, initial encounter for fracture: Secondary | ICD-10-CM | POA: Diagnosis present

## 2021-05-05 DIAGNOSIS — C7931 Secondary malignant neoplasm of brain: Secondary | ICD-10-CM | POA: Diagnosis present

## 2021-05-05 DIAGNOSIS — Z803 Family history of malignant neoplasm of breast: Secondary | ICD-10-CM

## 2021-05-05 DIAGNOSIS — D849 Immunodeficiency, unspecified: Secondary | ICD-10-CM | POA: Diagnosis present

## 2021-05-05 DIAGNOSIS — E876 Hypokalemia: Secondary | ICD-10-CM | POA: Diagnosis present

## 2021-05-05 DIAGNOSIS — Z818 Family history of other mental and behavioral disorders: Secondary | ICD-10-CM

## 2021-05-05 DIAGNOSIS — Z90710 Acquired absence of both cervix and uterus: Secondary | ICD-10-CM

## 2021-05-05 DIAGNOSIS — C7951 Secondary malignant neoplasm of bone: Secondary | ICD-10-CM | POA: Diagnosis present

## 2021-05-05 DIAGNOSIS — D630 Anemia in neoplastic disease: Secondary | ICD-10-CM | POA: Diagnosis present

## 2021-05-05 DIAGNOSIS — C50211 Malignant neoplasm of upper-inner quadrant of right female breast: Secondary | ICD-10-CM | POA: Diagnosis present

## 2021-05-05 DIAGNOSIS — E878 Other disorders of electrolyte and fluid balance, not elsewhere classified: Secondary | ICD-10-CM | POA: Diagnosis not present

## 2021-05-05 DIAGNOSIS — Z6824 Body mass index (BMI) 24.0-24.9, adult: Secondary | ICD-10-CM

## 2021-05-05 DIAGNOSIS — G4700 Insomnia, unspecified: Secondary | ICD-10-CM | POA: Diagnosis present

## 2021-05-05 DIAGNOSIS — F32A Depression, unspecified: Secondary | ICD-10-CM | POA: Diagnosis not present

## 2021-05-05 LAB — BASIC METABOLIC PANEL
Anion gap: 6 (ref 5–15)
BUN: 7 mg/dL (ref 6–20)
CO2: 26 mmol/L (ref 22–32)
Calcium: 7 mg/dL — ABNORMAL LOW (ref 8.9–10.3)
Chloride: 101 mmol/L (ref 98–111)
Creatinine, Ser: 0.45 mg/dL (ref 0.44–1.00)
GFR, Estimated: >60 mL/min (ref >60)
Glucose, Bld: 155 mg/dL — ABNORMAL HIGH (ref 70–99)
Potassium: 3.1 mmol/L — ABNORMAL LOW (ref 3.5–5.1)
Sodium: 133 mmol/L — ABNORMAL LOW (ref 135–145)

## 2021-05-05 LAB — MRSA NEXT GEN BY PCR, NASAL: MRSA by PCR Next Gen: NOT DETECTED

## 2021-05-05 LAB — LACTIC ACID, PLASMA
Lactic Acid, Venous: 2.8 mmol/L (ref 0.5–1.9)
Lactic Acid, Venous: 2.3 mmol/L (ref 0.5–1.9)

## 2021-05-05 LAB — CBC WITH DIFFERENTIAL/PLATELET
Abs Immature Granulocytes: 0.04 10*3/uL (ref 0.00–0.07)
Basophils Absolute: 0 10*3/uL (ref 0.0–0.1)
Basophils Relative: 3 %
Eosinophils Absolute: 0 10*3/uL (ref 0.0–0.5)
Eosinophils Relative: 5 %
HCT: 31.1 % — ABNORMAL LOW (ref 36.0–46.0)
Hemoglobin: 10.2 g/dL — ABNORMAL LOW (ref 12.0–15.0)
Immature Granulocytes: 5 %
Lymphocytes Relative: 20 %
Lymphs Abs: 0.2 10*3/uL — ABNORMAL LOW (ref 0.7–4.0)
MCH: 33.9 pg (ref 26.0–34.0)
MCHC: 32.8 g/dL (ref 30.0–36.0)
MCV: 103.3 fL — ABNORMAL HIGH (ref 80.0–100.0)
Monocytes Absolute: 0.2 10*3/uL (ref 0.1–1.0)
Monocytes Relative: 32 %
Neutro Abs: 0.3 10*3/uL — CL (ref 1.7–7.7)
Neutrophils Relative %: 35 %
Platelets: 131 10*3/uL — ABNORMAL LOW (ref 150–400)
RBC: 3.01 MIL/uL — ABNORMAL LOW (ref 3.87–5.11)
RDW: 22.5 % — ABNORMAL HIGH (ref 11.5–15.5)
WBC: 0.8 10*3/uL — CL (ref 4.0–10.5)
nRBC: 5.3 % — ABNORMAL HIGH (ref 0.0–0.2)

## 2021-05-05 LAB — RESP PANEL BY RT-PCR (FLU A&B, COVID) ARPGX2
Influenza A by PCR: NEGATIVE
Influenza B by PCR: NEGATIVE
SARS Coronavirus 2 by RT PCR: NEGATIVE

## 2021-05-05 LAB — MAGNESIUM: Magnesium: 1.9 mg/dL (ref 1.7–2.4)

## 2021-05-05 MED ORDER — SERTRALINE HCL 25 MG PO TABS
25.0000 mg | ORAL_TABLET | Freq: Every day | ORAL | Status: DC
  Administered 2021-05-06 – 2021-05-07 (×2): 25 mg via ORAL
  Filled 2021-05-05 (×2): qty 1

## 2021-05-05 MED ORDER — LACTATED RINGERS IV BOLUS (SEPSIS)
1000.0000 mL | Freq: Once | INTRAVENOUS | Status: AC
  Administered 2021-05-05: 1000 mL via INTRAVENOUS

## 2021-05-05 MED ORDER — NYSTATIN 100000 UNIT/ML MT SUSP
5.0000 mL | Freq: Four times a day (QID) | OROMUCOSAL | Status: DC
  Administered 2021-05-05 – 2021-05-07 (×7): 500000 [IU] via ORAL
  Filled 2021-05-05 (×7): qty 5

## 2021-05-05 MED ORDER — ENOXAPARIN SODIUM 40 MG/0.4ML IJ SOSY
40.0000 mg | PREFILLED_SYRINGE | INTRAMUSCULAR | Status: DC
  Administered 2021-05-05 – 2021-05-10 (×6): 40 mg via SUBCUTANEOUS
  Filled 2021-05-05 (×6): qty 0.4

## 2021-05-05 MED ORDER — ACETAMINOPHEN 325 MG PO TABS
650.0000 mg | ORAL_TABLET | Freq: Four times a day (QID) | ORAL | Status: DC | PRN
  Administered 2021-05-05: 650 mg via ORAL
  Filled 2021-05-05: qty 2

## 2021-05-05 MED ORDER — VALACYCLOVIR HCL 500 MG PO TABS
1000.0000 mg | ORAL_TABLET | Freq: Every day | ORAL | Status: DC
Start: 2021-05-06 — End: 2021-05-11
  Administered 2021-05-06 – 2021-05-11 (×6): 1000 mg via ORAL
  Filled 2021-05-05 (×6): qty 2

## 2021-05-05 MED ORDER — ONDANSETRON HCL 4 MG/2ML IJ SOLN
4.0000 mg | Freq: Four times a day (QID) | INTRAMUSCULAR | Status: DC | PRN
  Administered 2021-05-05: 4 mg via INTRAVENOUS
  Filled 2021-05-05: qty 2

## 2021-05-05 MED ORDER — VANCOMYCIN HCL 1500 MG/300ML IV SOLN
1500.0000 mg | Freq: Once | INTRAVENOUS | Status: DC
  Filled 2021-05-05: qty 300

## 2021-05-05 MED ORDER — ACETAMINOPHEN 650 MG RE SUPP: 650.0000 mg | Freq: Four times a day (QID) | RECTAL | Status: DC | PRN

## 2021-05-05 MED ORDER — METRONIDAZOLE 500 MG/100ML IV SOLN
500.0000 mg | Freq: Once | INTRAVENOUS | Status: AC
  Administered 2021-05-05: 500 mg via INTRAVENOUS
  Filled 2021-05-05: qty 100

## 2021-05-05 MED ORDER — PANTOPRAZOLE SODIUM 40 MG PO TBEC
40.0000 mg | DELAYED_RELEASE_TABLET | Freq: Every day | ORAL | Status: DC
  Administered 2021-05-06 – 2021-05-11 (×6): 40 mg via ORAL
  Filled 2021-05-05 (×6): qty 1

## 2021-05-05 MED ORDER — LACTATED RINGERS IV SOLN: INTRAVENOUS | Status: DC

## 2021-05-05 MED ORDER — LACTATED RINGERS IV BOLUS
1000.0000 mL | Freq: Once | INTRAVENOUS | Status: AC
  Administered 2021-05-05: 1000 mL via INTRAVENOUS

## 2021-05-05 MED ORDER — ONDANSETRON HCL 4 MG/2ML IJ SOLN
4.0000 mg | Freq: Three times a day (TID) | INTRAMUSCULAR | Status: DC
  Administered 2021-05-06 – 2021-05-11 (×11): 4 mg via INTRAVENOUS
  Filled 2021-05-05 (×13): qty 2

## 2021-05-05 MED ORDER — SODIUM CHLORIDE 0.9 % IV SOLN
2.0000 g | Freq: Three times a day (TID) | INTRAVENOUS | Status: AC
  Administered 2021-05-05 – 2021-05-08 (×9): 2 g via INTRAVENOUS
  Filled 2021-05-05 (×9): qty 2

## 2021-05-05 MED ORDER — POTASSIUM CHLORIDE CRYS ER 20 MEQ PO TBCR: 40.0000 meq | EXTENDED_RELEASE_TABLET | Freq: Once | ORAL | Status: DC

## 2021-05-05 MED ORDER — MIRTAZAPINE 15 MG PO TABS
7.5000 mg | ORAL_TABLET | Freq: Every day | ORAL | Status: DC
  Administered 2021-05-05 – 2021-05-06 (×2): 7.5 mg via ORAL
  Filled 2021-05-05 (×2): qty 1

## 2021-05-05 MED ORDER — IBUPROFEN 100 MG/5ML PO SUSP
200.0000 mg | Freq: Four times a day (QID) | ORAL | Status: DC | PRN
  Administered 2021-05-06: 200 mg via ORAL
  Filled 2021-05-05 (×2): qty 10

## 2021-05-05 MED ORDER — ONDANSETRON HCL 4 MG PO TABS: 4.0000 mg | ORAL_TABLET | Freq: Four times a day (QID) | ORAL | Status: DC | PRN

## 2021-05-05 MED ORDER — LACTATED RINGERS IV BOLUS
500.0000 mL | Freq: Once | INTRAVENOUS | Status: AC
  Administered 2021-05-06: 500 mL via INTRAVENOUS

## 2021-05-05 MED ORDER — ACETAMINOPHEN 500 MG PO TABS
1000.0000 mg | ORAL_TABLET | Freq: Once | ORAL | Status: AC
  Administered 2021-05-05: 1000 mg via ORAL
  Filled 2021-05-05: qty 2

## 2021-05-05 MED ORDER — ONDANSETRON HCL 4 MG PO TABS
4.0000 mg | ORAL_TABLET | Freq: Three times a day (TID) | ORAL | Status: DC
  Administered 2021-05-05 – 2021-05-10 (×12): 4 mg via ORAL
  Filled 2021-05-05 (×15): qty 1

## 2021-05-05 MED ORDER — ENOXAPARIN SODIUM 40 MG/0.4ML IJ SOSY: 40.0000 mg | PREFILLED_SYRINGE | INTRAMUSCULAR | Status: DC

## 2021-05-05 MED ORDER — VANCOMYCIN HCL IN DEXTROSE 1-5 GM/200ML-% IV SOLN: 1000.0000 mg | Freq: Once | INTRAVENOUS | Status: DC

## 2021-05-05 MED ORDER — ENSURE ENLIVE PO LIQD
237.0000 mL | Freq: Two times a day (BID) | ORAL | Status: DC
  Administered 2021-05-06: 237 mL via ORAL

## 2021-05-05 MED ORDER — GABAPENTIN 300 MG PO CAPS
300.0000 mg | ORAL_CAPSULE | Freq: Three times a day (TID) | ORAL | Status: DC
  Administered 2021-05-05 – 2021-05-11 (×19): 300 mg via ORAL
  Filled 2021-05-05 (×19): qty 1

## 2021-05-05 MED ORDER — FLUCONAZOLE 100 MG PO TABS
100.0000 mg | ORAL_TABLET | Freq: Every day | ORAL | Status: DC
  Administered 2021-05-06 – 2021-05-11 (×6): 100 mg via ORAL
  Filled 2021-05-05 (×6): qty 1

## 2021-05-05 MED ORDER — POTASSIUM CHLORIDE IN NACL 20-0.9 MEQ/L-% IV SOLN
INTRAVENOUS | Status: DC
  Filled 2021-05-05 (×3): qty 1000

## 2021-05-05 MED ORDER — SODIUM CHLORIDE 0.9 % IV SOLN
2.0000 g | Freq: Once | INTRAVENOUS | Status: AC
  Administered 2021-05-05: 2 g via INTRAVENOUS
  Filled 2021-05-05: qty 2

## 2021-05-05 MED ORDER — POLYETHYLENE GLYCOL 3350 17 G PO PACK: 17.0000 g | PACK | Freq: Every day | ORAL | Status: DC | PRN

## 2021-05-05 NOTE — Plan of Care (Signed)
[facility] MD Plan of Care:  Patient was unable to be assessed by MD this morning due to acuity of patient presentation and urgent need to transfer to ED. According to RN, patient felt nauseous and endorsed diarrhea yesterday. This morning, she became febrile and has had episodes of emesis.   On Saturday, 10/22, she was transferred to [facility] after the following labs: WBC 1.3, ANC 900  While in the ED, she was given Granix, discharged back to [facility] and advised to return to the ED if she became febrile.   VS this AM:  10/24 0933 120/76 108 Important  20 97 % 102 F (38.9 C) Important   10/24 0807 122/84 117 Important  -- -- 103.1 F (39.5 C) Important    Labs this AM: WBC 0.8, ANC 304  She was sent via EMS to [facility], and had an episode of emesis upon their arrival to [facility]. EMS was unable to transport patient's Tucatinib so [facility] security transported it for her. Her next dose is due at 8 PM on 10/24.  Her oncologist, Dr. Lurline Del, was given an update call.    Plan per [facility]: MDD-Single episode (in context of cancer recurrence and aggressive nature) -Continue Zoloft 25 mg for MDD -Continue Remeron 7.5 mg for depression, insomnia, and poor appetite.  3 drug interaction checkers assessed for potential rxn b/w Zoloft and Tucatinib (no interactions) and Remeron and Tucatinib (Tucatinib is a CYP3A4 inhibitor which may increase the serum amount of mirtazapine). -Continue home Neurontin 300 mg TID   Rosezetta Schlatter, MD PGY-1 05/05/2021 [place] Department of Psychiatry

## 2021-05-05 NOTE — ED Notes (Signed)
Report called to Wilnette Kales, Therapist, sports.

## 2021-05-05 NOTE — Progress Notes (Signed)
   05/05/21 1802  Assess: MEWS Score  Temp 99.6 F (37.6 C)  BP 95/75  Pulse Rate (!) 111  Resp 16  SpO2 99 %  O2 Device Room Air  Assess: MEWS Score  MEWS Temp 0  MEWS Systolic 1  MEWS Pulse 2  MEWS RR 0  MEWS LOC 0  MEWS Score 3  MEWS Score Color Yellow  Assess: if the MEWS score is Yellow or Red  Were vital signs taken at a resting state? Yes  Focused Assessment No change from prior assessment  Does the patient meet 2 or more of the SIRS criteria? No  MEWS guidelines implemented *See Row Information* No, previously yellow, continue vital signs every 4 hours  Assess: SIRS CRITERIA  SIRS Temperature  0  SIRS Pulse 1  SIRS Respirations  0  SIRS WBC 0  SIRS Score Sum  1

## 2021-05-05 NOTE — Progress Notes (Signed)
   05/05/21 1942  Assess: MEWS Score  Temp (!) 101 F (38.3 C)  BP 101/73  Pulse Rate (!) 115  Resp 17  SpO2 96 %  O2 Device Room Air  Assess: MEWS Score  MEWS Temp 1  MEWS Systolic 0  MEWS Pulse 2  MEWS RR 0  MEWS LOC 0  MEWS Score 3  MEWS Score Color Yellow  Assess: if the MEWS score is Yellow or Red  Were vital signs taken at a resting state? Yes  Focused Assessment No change from prior assessment  Does the patient meet 2 or more of the SIRS criteria? Yes  Does the patient have a confirmed or suspected source of infection? No  MEWS guidelines implemented *See Row Information* No, previously yellow, continue vital signs every 4 hours  Treat  MEWS Interventions Administered scheduled meds/treatments;Administered prn meds/treatments  Take Vital Signs  Increase Vital Sign Frequency  Yellow: Q 2hr X 2 then Q 4hr X 2, if remains yellow, continue Q 4hrs  Escalate  MEWS: Escalate Yellow: discuss with charge nurse/RN and consider discussing with provider and RRT  Notify: Charge Nurse/RN  Name of Charge Nurse/RN Brewing technologist (2nd RN)  Date Charge Nurse/RN Notified 05/05/21  Time Charge Nurse/RN Notified 1942  Notify: Provider  Provider Name/Title N/A (consistent yellow MEWS; not needed at this time.)  Notify: Rapid Response  Name of Rapid Response RN Notified n/a (not needed yet. pt. has been consistently yellow.)  Document  Patient Outcome Other (Comment) (still remains yellow MEWS)  Progress note created (see row info) Yes  Assess: SIRS CRITERIA  SIRS Temperature  1  SIRS Pulse 1  SIRS Respirations  0  SIRS WBC 0  SIRS Score Sum  2

## 2021-05-05 NOTE — Progress Notes (Signed)
Elink monitoring completed

## 2021-05-05 NOTE — ED Notes (Signed)
Pt presents from [facility] via EMS for fever and low WBC, and accompanying sx of N/V. Pt with a hx of breast cancer. Pt admitted to [facility] for depression. Pt has been admitted at [facility] x4-5 days. EMS fails to give en route VS at this time.

## 2021-05-05 NOTE — Progress Notes (Signed)
   05/05/21 2344  Assess: MEWS Score  Temp (!) 101.7 F (38.7 C)  BP 99/75  Pulse Rate (!) 116  Resp 20  SpO2 93 %  O2 Device Room Air  Assess: MEWS Score  MEWS Temp 2  MEWS Systolic 1  MEWS Pulse 2  MEWS RR 0  MEWS LOC 0  MEWS Score 5  MEWS Score Color Red  Assess: if the MEWS score is Yellow or Red  Were vital signs taken at a resting state? Yes  Focused Assessment Change from prior assessment (see assessment flowsheet)  Does the patient meet 2 or more of the SIRS criteria? Yes  Does the patient have a confirmed or suspected source of infection? No  MEWS guidelines implemented *See Row Information* Yes  Treat  MEWS Interventions Administered scheduled meds/treatments;Administered prn meds/treatments  Take Vital Signs  Increase Vital Sign Frequency  Red: Q 1hr X 4 then Q 4hr X 4, if remains red, continue Q 4hrs  Escalate  MEWS: Escalate Red: discuss with charge nurse/RN and provider, consider discussing with RRT  Notify: Charge Nurse/RN  Name of Charge Nurse/RN Brewing technologist (second RN)  Date Charge Nurse/RN Notified 05/05/21  Time Charge Nurse/RN Notified 1942  Notify: Provider  Provider Name/Title Clarene Essex (NP)  Date Provider Notified 05/05/21  Time Provider Notified 2345  Notification Type Page  Notification Reason Other (Comment) (RED MEWS, temp elevated.)  Provider response See new orders  Date of Provider Response 05/05/21  Time of Provider Response 2345  Notify: Rapid Response  Name of Rapid Response RN Notified Erin, RN  Date Rapid Response Notified 05/05/21  Time Rapid Response Notified 2358  Document  Patient Outcome Other (Comment) (watching closely. status pending.)  Progress note created (see row info) Yes  Assess: SIRS CRITERIA  SIRS Temperature  1  SIRS Pulse 1  SIRS Respirations  0  SIRS WBC 0  SIRS Score Sum  2

## 2021-05-05 NOTE — Progress Notes (Signed)
Elink following code sepsis °

## 2021-05-05 NOTE — Plan of Care (Signed)
  Problem: Education: Goal: Knowledge of the prescribed therapeutic regimen will improve Outcome: Progressing   Problem: Education: Goal: Emotional status will improve Outcome: Not Progressing Goal: Mental status will improve Outcome: Not Progressing

## 2021-05-05 NOTE — BHH Group Notes (Signed)
The focus of this group is to help patients establish daily goals to achieve during treatment and discuss how the patient can incorporate goal setting into their daily lives to aide in recovery.  Pt not feeling well did not complete worksheet

## 2021-05-05 NOTE — Progress Notes (Signed)
  [facility] Adult Case Management Discharge Plan :  Will you be returning to the same living situation after discharge:  No. Emergency Department/[facility]  At discharge, do you have transportation home?: Yes,  Ambulance  Do you have the ability to pay for your medications: Yes,  Medicaid   Release of information consent forms completed and in the chart;  Patient's signature needed at discharge.  Patient to Follow up at:  [facility].   Specialty: Behavioral Health Why: Please go to this provider for therapy and medication management services, during walk in hours:  Monday through Wednesday, from 7:45 am to 11:00 am.  Services are provided on a first come, first served basis (please arrive early)  * Services unavailable 05/05/21 to 05/09/21. Contact information: [facility]. Go to.   Why: Meets 3rd Tuesday of the month from 6:30-8:00 pm via Zoom. Contact information: Phone:  [phone]. Email: [email or web address]                Next level of care provider has access to [place] and Suicide Prevention discussed: Yes,  with patient      Has patient been referred to the Quitline?: N/A patient is not a smoker  Patient has been referred for addiction treatment: [place]unk Foss, [place] 05/05/2021, 3:53 PM

## 2021-05-05 NOTE — ED Provider Notes (Signed)
[place] DEPT Provider Note   CSN: [id number] Arrival date & time: 05/05/21  0920     History Chief Complaint  Patient presents with   MDD    Monica [name] is a 50 y.o. female.  HPI  50 year old female with past medical history of breast cancer status postresection, radiation currently on antineoplastic treatment who has been admitted to [facility] for depression presents back to the ER for concern of neutropenia and fever.  Patient was seen previously in this ER for medical clearance.  Heme-onc was consulted due to her neutropenia, they advised medication treatment in the ER and strict return ED precautions of fever of which she developed last night into today.  Patient had mild nausea with an episode of emesis prior to arrival but denies any congestion, sore throat, cough, abdominal pain, diarrhea.  She is currently being treated for depression at [facility].  Follows with Dr. Jana Hakim for heme onc.  Past Medical History:  Diagnosis Date   Arthritis    lower back   Cancer [facility])    right breast cancer   Family history of breast cancer    Personal history of chemotherapy    Personal history of radiation therapy     Patient Active Problem List   Diagnosis Date Noted   Major depressive disorder 05/04/2021   Compression fracture of vertebral column ([place]) 05/03/2021   MDD (major depressive disorder), single episode with melancholic features 52/84/1324   Suicidal ideation 05/02/2021   Body mass index (BMI) 25.0-25.9, adult 03/20/2021   Impaired mobility and ADLs 01/27/2021   Superficial thrombophlebitis of left upper extremity 01/26/2021   Acneiform rash 01/22/2021   Elevated LFTs 01/22/2021   Hypoalbuminemia due to protein-calorie malnutrition ([place]) 01/22/2021   Metastatic breast cancer ([place]) 01/21/2021   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[place]) 01/21/2021   Hypokalemia    Hypomagnesemia    Hypercalcemia    Pain 01/14/2021    Lung metastases ([place]) 12/31/2020   Liver metastases ([place]) 12/31/2020   Brain tumor ([place]) 12/06/2020   Palliative care by specialist    Bone metastases [facility])    Metastatic malignant neoplasm ([place])    Brain metastases ([place])    Cancer related pain 11/30/2020   Port-A-Cath in place 03/08/2018   Goals of care, counseling/discussion 02/03/2018   Family history of breast cancer 01/12/2018   Malignant neoplasm of upper-inner quadrant of right breast in female, estrogen receptor positive ([place]) 01/05/2018   Vaginal bleeding 02/07/2017   Chronic anemia 02/07/2017   Orthostasis 02/07/2017   S/P laparoscopic hysterectomy 01/20/2017    Past Surgical History:  Procedure Laterality Date   ABDOMINAL HYSTERECTOMY     APPLICATION OF CRANIAL NAVIGATION N/A 12/06/2020   Procedure: APPLICATION OF CRANIAL NAVIGATION;  Surgeon: Judith Part, MD;  Location: [place];  Service: Neurosurgery;  Laterality: N/A;  RM 20   BREAST LUMPECTOMY Right    BREAST LUMPECTOMY WITH RADIOACTIVE SEED AND SENTINEL LYMPH NODE BIOPSY Right 02/16/2018   Procedure: RIGHT BREAST LUMPECTOMY WITH RADIOACTIVE SEED AND SENTINEL LYMPH NODE BIOPSY;  Surgeon: [name], MD;  Location: [place];  Service: General;  Laterality: Right;   CRANIOTOMY Right 12/06/2020   Procedure: Right sided awake craniotomy for tumor resection;  Surgeon: Judith Part, MD;  Location: [place];  Service: Neurosurgery;  Laterality: Right;   HYMENECTOMY     IR IMAGING GUIDED PORT INSERTION  03/12/2021   IR US GUIDE BX ASP/DRAIN  12/02/2020  KYPHOPLASTY N/A 03/07/2021   Procedure: Thoracic nine, Thoracic ten KYPHOPLASTY;  Surgeon: Judith Part, MD;  Location: [place];  Service: Neurosurgery;  Laterality: N/A;   PORTACATH PLACEMENT Right 02/16/2018   Procedure: INSERTION PORT-A-CATH;  Surgeon: [name], MD;  Location: [place];  Service: General;  Laterality: Right;   RE-EXCISION OF BREAST LUMPECTOMY  Right 02/22/2018   Procedure: RE-EXCISION OF RIGHT  BREAST LUMPECTOMY;  Surgeon: [name], MD;  Location: [place];  Service: General;  Laterality: Right;   REPAIR VAGINAL CUFF N/A 02/07/2017   Procedure: REPAIR VAGINAL CUFF;  Surgeon: Ena Dawley, MD;  Location: [facility];  Service: Gynecology;  Laterality: N/A;   ROBOTIC ASSISTED TOTAL HYSTERECTOMY WITH SALPINGECTOMY Left 01/20/2017   Procedure: ROBOTIC ASSISTED TOTAL HYSTERECTOMY WITH SALPINGECTOMY;  Surgeon: [name], MD;  Location: [facility];  Service: Gynecology;  Laterality: Left;   TOTAL HIP ARTHROPLASTY Right 2022   total hip and partial femur, per pt     OB History     Gravida  2   Para  2   Term      Preterm      AB      Living         SAB      IAB      Ectopic      Multiple      Live Births              Family History  Problem Relation Age of Onset   Lung cancer Maternal Grandfather    Esophageal cancer Paternal Grandfather 46   Breast cancer Cousin 74    Social History   Tobacco Use   Smoking status: Never   Smokeless tobacco: Never  Vaping Use   Vaping Use: Former  Substance Use Topics   Alcohol use: Not Currently   Drug use: No    Home Medications Prior to Admission medications   Medication Sig Start Date End Date Taking? Authorizing Provider  acetaminophen (TYLENOL) 500 MG tablet Take 1 tablet (500 mg total) by mouth 3 (three) times daily as needed for mild pain (or Fever >/= 101). Take with ibuprofen 400 mg 02/10/21   Magrinat, [name], MD  b complex vitamins capsule Take 1 capsule by mouth daily. 900 mg    [provider]  capecitabine (XELODA) 500 MG tablet Take 1,500 mg by mouth 2 (two) times daily. Takes daily for 21 days, then 7 days off, then repeat cycle 04/25/21   [provider]  Cholecalciferol (VITAMIN D) 125 MCG (5000 UT) CAPS Take 5,000 Units by mouth daily. 06/20/20   Magrinat, [name], MD  dexamethasone (DECADRON) 4 MG tablet  Take 2 tablets (8 mg total) by mouth daily. Start the day after chemotherapy for 2 days. Patient not taking: Reported on 05/03/2021 04/15/21   Magrinat, [name], MD  ferrous sulfate 325 (65 FE) MG tablet Take 325 mg by mouth daily with breakfast.    [provider]  fluconazole (DIFLUCAN) 100 MG tablet TAKE 1 TABLET BY MOUTH EVERY DAY Patient taking differently: Take 100 mg by mouth daily. 04/14/21   Magrinat, [name], MD  gabapentin (NEURONTIN) 300 MG capsule Take 1 capsule (300 mg total) by mouth 3 (three) times daily. 02/13/21   Magrinat, [name], MD  ibuprofen (ADVIL) 200 MG tablet Take 400 mg by mouth 3 (three) times daily as needed for mild pain (take with tylenol).    [provider]  mirtazapine (REMERON) 7.5 MG tablet Take 1 tablet (7.5 mg total) by mouth at bedtime. 05/03/21   Rosezetta Schlatter, MD  ondansetron (ZOFRAN ODT) 4 MG disintegrating tablet Take 1 tablet (4 mg total) by mouth See admin instructions. Dissolve 4 mg in the mouth three times a day and an additional 4 mg at bedtime as needed for nausea/vomiting 03/19/21   Magrinat, [name], MD  pantoprazole (PROTONIX) 40 MG tablet Take 40 mg by mouth daily. 02/06/21   [provider]  polyethylene glycol (MIRALAX / GLYCOLAX) 17 g packet Take 17 g by mouth daily as needed for moderate constipation. Patient not taking: Reported on 05/03/2021 01/21/21   Eugenie Filler, MD  pyridOXINE (VITAMIN B-6) 100 MG tablet Take 100 mg by mouth daily.    [provider]  sertraline (ZOLOFT) 25 MG tablet Take 1 tablet (25 mg total) by mouth daily. 05/04/21   Rosezetta Schlatter, MD  TUKYSA 150 MG tablet Take 150 mg by mouth 2 (two) times daily. 02/26/21   [provider]  valACYclovir (VALTREX) 1000 MG tablet Take 1 tablet (1,000 mg total) by mouth 2 (two) times daily. Take one tablet twice a day for one week, then daily Patient taking differently: Take 1,000 mg by mouth daily. 04/15/21   Magrinat, [name], MD   prochlorperazine (COMPAZINE) 5 MG tablet Take 1 tablet (5 mg total) by mouth every 8 (eight) hours as needed for nausea or vomiting. 03/31/21 04/15/21  Magrinat, [name], MD    Allergies    Patient has no known allergies.  Review of Systems   Review of Systems  Constitutional:  Positive for appetite change and fatigue. Negative for chills and fever.  HENT:  Negative for congestion and sore throat.   Eyes:  Negative for visual disturbance.  Respiratory:  Negative for shortness of breath.   Cardiovascular:  Negative for chest pain.  Gastrointestinal:  Positive for nausea and vomiting. Negative for abdominal pain and diarrhea.  Genitourinary:  Negative for dysuria.  Musculoskeletal:  Negative for back pain.  Skin:  Negative for rash.  Neurological:  Negative for headaches.   Physical Exam Updated Vital Signs BP 107/76 (BP Location: Right Arm)   Pulse (!) 105   Temp (!) 102 F (38.9 C) (Oral)   Resp 15   Ht 5\' 8"  (1.727 m)   Wt 66.7 kg   LMP 12/21/2016 (Exact Date)   SpO2 99%   BMI 22.35 kg/m   Physical Exam Vitals and nursing note reviewed.  Constitutional:      Appearance: Normal appearance.  HENT:     Head: Normocephalic.     Mouth/Throat:     Mouth: Mucous membranes are moist.  Cardiovascular:     Rate and Rhythm: Tachycardia present.  Pulmonary:     Effort: Pulmonary effort is normal. No respiratory distress.  Abdominal:     Palpations: Abdomen is soft.     Tenderness: There is no abdominal tenderness. There is no guarding.  Musculoskeletal:     Cervical back: No rigidity or tenderness.  Skin:    General: Skin is warm.  Neurological:     Mental Status: She is alert and oriented to person, place, and time. Mental status is at baseline.  Psychiatric:        Mood and Affect: Mood normal.    ED Results / Procedures / Treatments   Labs (all labs ordered are listed, but only abnormal results are displayed) Labs Reviewed  URINALYSIS, [place]  MICROSCOPIC - Abnormal; Notable for the following components:      Result Value   APPearance HAZY (*)    Nitrite POSITIVE (*)    Bacteria, UA RARE (*)    All other components within normal limits  CBC WITH DIFFERENTIAL/PLATELET - Abnormal; Notable for the following components:   WBC 0.8 (*)    RBC 3.01 (*)    Hemoglobin 10.2 (*)    HCT 31.1 (*)    MCV 103.3 (*)    RDW 22.5 (*)    Platelets 131 (*)    nRBC 5.3 (*)    Neutro Abs 0.3 (*)    Lymphs Abs 0.2 (*)    All other components within normal limits  BASIC METABOLIC PANEL - Abnormal; Notable for the following components:   Sodium 133 (*)    Potassium 3.1 (*)    Glucose, Bld 155 (*)    Calcium 7.0 (*)    All other components within normal limits  URINE CULTURE  CULTURE, BLOOD (ROUTINE X 2)  CULTURE, BLOOD (ROUTINE X 2)  LACTIC ACID, PLASMA  LACTIC ACID, PLASMA  MAGNESIUM    EKG None  Radiology DG Chest 2 View  Result Date: 05/03/2021 CLINICAL DATA:  Shortness of breath. Weakness and fatigue. Breast cancer with active chemotherapy. EXAM: CHEST - 2 VIEW COMPARISON:  Radiograph 11/27/2020, CT 04/14/2021 FINDINGS: Right chest port with tip in the SVC. Nodular opacity in the right mid lung just inferior to the site of port, corresponds to nodular opacities on CT. The lungs are otherwise clear. No pulmonary edema, pleural effusion, or pneumothorax. Slight volume loss in the right hemithorax is stable from prior CT. Surgical clips in the right axilla. Kyphoplasty within T9 and T10. Similar compression fractures in the thoracic spine. Osseous metastatic disease with patchy areas of sclerosis, assessed on recent CT. IMPRESSION: 1. Stable nodular consolidation in the right mid lung compared with recent CT. 2. No acute intrathoracic findings. 3. Osseous metastatic disease. Electronically Signed   By: Keith Rake M.D.   On: 05/03/2021 18:35    Procedures .Critical Care Performed by: Lorelle Gibbs, DO Authorized by: Lorelle Gibbs, DO   Critical care provider statement:    Critical care time (minutes):  45   Critical care time was exclusive of:  Separately billable procedures and treating other patients   Critical care was necessary to treat or prevent imminent or life-threatening deterioration of the following conditions:  Sepsis   Critical care was time spent personally by me on the following activities:  Development of treatment plan with patient or surrogate, discussions with consultants, evaluation of patient's response to treatment, examination of patient, obtaining history from patient or surrogate, ordering and performing treatments and interventions, ordering and review of laboratory studies, re-evaluation of patient's condition and review of old charts   I assumed direction of critical care for this patient from another provider in my specialty: no     Care discussed with: admitting provider     Medications Ordered in ED Medications  acetaminophen (TYLENOL) tablet 650 mg (has no administration in time range)  alum & mag hydroxide-simeth (MAALOX/MYLANTA) 200-200-20 MG/5ML suspension 30 mL (has no administration in time range)  magnesium hydroxide (MILK OF MAGNESIA) suspension 30 mL (has no administration in time range)  hydrOXYzine (ATARAX/VISTARIL) tablet 25 mg (25 mg Oral Given 05/04/21 1513)  traZODone (DESYREL) tablet 50 mg (has no administration in time range)  feeding supplement (ENSURE ENLIVE / ENSURE PLUS) liquid 237 mL (237  mLs Oral Given 05/04/21 1500)  ferrous sulfate tablet 325 mg (325 mg Oral Given 05/05/21 0804)  gabapentin (NEURONTIN) capsule 300 mg (300 mg Oral Given 05/05/21 0803)  mirtazapine (REMERON) tablet 7.5 mg (7.5 mg Oral Given 05/04/21 2131)  pantoprazole (PROTONIX) EC tablet 40 mg (40 mg Oral Given 05/05/21 0806)  polyethylene glycol (MIRALAX / GLYCOLAX) packet 17 g (has no administration in time range)  loperamide (IMODIUM) capsule 2 mg (2 mg Oral Given 05/05/21 0932)   pyridOXINE (VITAMIN B-6) tablet 100 mg (100 mg Oral Given 05/04/21 1700)  sertraline (ZOLOFT) tablet 25 mg (25 mg Oral Given 05/05/21 0806)  valACYclovir (VALTREX) tablet 1,000 mg (1,000 mg Oral Given 05/05/21 3557)  tucatinib (TUKYSA) tablet 150 mg (150 mg Oral Given 05/05/21 0802)  ondansetron (ZOFRAN-ODT) disintegrating tablet 4 mg (4 mg Oral Given 05/05/21 3220)  ibuprofen (ADVIL) tablet 400 mg (has no administration in time range)  lactated ringers bolus 1,000 mL (has no administration in time range)  acetaminophen (TYLENOL) tablet 1,000 mg (has no administration in time range)    ED Course  I have reviewed the triage vital signs and the nursing notes.  Pertinent labs & imaging results that were available during my care of the patient were reviewed by me and considered in my medical decision making (see chart for details).    MDM Rules/Calculators/A&P                           50 year old female presents emergency department concern for fever.  Patient is from behavioral health, currently admitted for treatment of depression.  On arrival she is febrile.  Had 1 episode of emesis today but otherwise denies any acute complaints.  Tachycardic with stable blood pressure.  Blood work confirms neutropenia, mild electrolyte abnormalities.  Urinalysis is nitrate positive but otherwise does not appear suspicious for acute UTI.  Patient declined chest x-ray.  Will treat per sepsis protocol for neutropenic fever of unknown source.  Patient will be admitted to the hospitalist.  Dr. Olevia Bowens is excepting.  Dr. Jana Hakim her oncologist has been notified, he will give recommendations and see her as well.  Patient's mother and daughter have been notified at her request.  Patients evaluation and results requires admission for further treatment and care. Patient agrees with admission plan, offers no new complaints and is stable/unchanged at time of admit.   Final Clinical Impression(s) / ED  Diagnoses Final diagnoses:  None    Rx / DC Orders ED Discharge Orders     None        Lorelle Gibbs, DO 05/05/21 1358

## 2021-05-05 NOTE — Progress Notes (Signed)
Patient transferred to [facility] ED.  Pt vomited as she was leaving [facility].  Patient had denied SI and HI.  Denied A/V hallucinations.  Patient stated she did not feel as well as yesterday.

## 2021-05-05 NOTE — Progress Notes (Signed)
Pt presents with moderate anxiety and depression.  Pt is tearful on assessment.  Provided reassurance and support.  Pt attended group briefly.  Pt became nauseated in room and vomited.  Admistered PRN Zofran per MAR per pt request.  Pt denies SI/HI and verbally contracts for safety.  Pt denies AVH.  Pt is safe on unit with Q 15 minute safety checks.    05/04/21 2131  Psych Admission Type (Psych Patients Only)  Admission Status Voluntary  Psychosocial Assessment  Patient Complaints Sadness;Hopelessness;Worrying;Anxiety;Depression  Eye Contact Brief  Facial Expression Anxious;Sad;Worried  Affect Sad;Anxious;Apprehensive;Depressed  Speech Logical/coherent  Interaction Minimal  Motor Activity Slow;Unsteady  Appearance/Hygiene Unremarkable  Behavior Characteristics Cooperative;Anxious  Mood Depressed;Apprehensive;Sad;Anxious  Thought Process  Coherency WDL  Content WDL  Delusions None reported or observed  Perception WDL  Hallucination None reported or observed  Judgment WDL  Confusion None  Danger to Self  Current suicidal ideation? Denies  Self-Injurious Behavior No self-injurious ideation or behavior indicators observed or expressed   Agreement Not to Harm Self Yes  Description of Agreement contracts for safety verbal  Danger to Others  Danger to Others None reported or observed

## 2021-05-05 NOTE — Progress Notes (Signed)
NUTRITION ASSESSMENT RD working remotely.  Pt identified as at risk on the Malnutrition Screen Tool  INTERVENTION: - continue Ensure Enlive BID, each supplement provides 350 kcal and 20 grams of protein.  NUTRITION DIAGNOSIS: Unintentional weight loss related to sub-optimal intake as evidenced by pt report.   Goal: Pt to meet >/= 90% of their estimated nutrition needs.  Monitor:  PO intake  Assessment:  Patient noted to have medical history of stage 4 breast cancer undergoing chemo, no psychiatric hx.   Weight on 10/23 was 147 lb and weight on 03/25/21 was 161 lb. This indicates 14 lb weight loss (8.7% body weight in 1 month); significant for time frame.  Ensure was ordered BID per ONS protocol yesterday and patient accepted the 1 bottle of supplement she has been offered so far.   50 y.o. female  Height: Ht Readings from Last 1 Encounters:  05/04/21 5\' 8"  (1.727 m)    Weight: Wt Readings from Last 1 Encounters:  05/04/21 66.7 kg    Weight Hx: Wt Readings from Last 10 Encounters:  05/04/21 66.7 kg  05/01/21 67.6 kg  04/23/21 71 kg  04/15/21 70.6 kg  03/25/21 73.2 kg  03/12/21 73.5 kg  03/07/21 73.5 kg  03/05/21 73.8 kg  03/04/21 73.6 kg  01/14/21 78 kg    BMI:  Body mass index is 22.35 kg/m. Pt meets criteria for normal weight based on current BMI.  Estimated Nutritional Needs: Kcal: 25-30 kcal/kg Protein: > 1 gram protein/kg Fluid: 1 ml/kcal  Diet Order:  Diet Order             Diet regular Room service appropriate? Yes; Fluid consistency: Thin  Diet effective now                  Pt is also offered choice of unit snacks mid-morning and mid-afternoon.  Pt is eating as desired.   Lab results and medications reviewed.     Monica Matin, MS, RD, LDN, CNSC Inpatient Clinical Dietitian RD pager # available in [facility]  After hours/weekend pager # available in [facility]

## 2021-05-05 NOTE — H&P (Signed)
History and Physical    [place] Natarajan ZOX:[id number] DOB: 09-01-1970 DOA: 05/05/2021  PCP: Beverley Fiedler, FNP  Patient coming from: [facility].  Chief Complaint: Fever, nausea and vomiting.  HPI: Shanina Kepple is a 50 y.o. female with medical history significant of osteoarthritis, depression, metastatic breast cancer who was admitted for suicidal ideations on 05/03/2021 and is being referred from behavioral health to the emergency department due to fever, abdominal pain, nausea and emesis.  Patient does have a history of breast cancer status postresection and radiation on antineoplastic treatment.  Patient follows up with Dr. Jana Hakim [facility] as outpatient.  Due to the symptoms, patient was sent into the ED.  Patient complains of decreased appetite fatigue nausea and vomiting.  ED Course: Initial vital signs were temperature 103.1 F, pulse 117, respirations 23 BP 126/89 mmHg. patient was mildly tachycardic.  The patient received IV fluids and cefepime in the emergency department.CBC showed a white count of 0.8 with 35% neutrophils, hemoglobin 10.2 g/dL platelets 131.  BMP showed potassium of 3.1 mmol/L, glucose of 155 and calcium of 7.0 mg/dL.  Sodium was 133.  Urinalysis showed positive nitrate and bacteria.   2 view chest radiograph show a stable nodular consolidation in the right midlung compared with recent CT.  There was no acute intrathoracic findings.  There was osseous metastatic disease.  Patient was then admitted hospital for further evaluation and treatment.  Patient received broad-spectrum antibiotic in the ED and oncology was notified.  Review of Systems: As per HPI otherwise all other systems reviewed and are negative.  Past Medical History:  Diagnosis Date   Arthritis    lower back   Cancer [facility])    right breast cancer   Family history of breast cancer    Personal history of chemotherapy    Personal history of radiation therapy     Past Surgical  History:  Procedure Laterality Date   ABDOMINAL HYSTERECTOMY     APPLICATION OF CRANIAL NAVIGATION N/A 12/06/2020   Procedure: APPLICATION OF CRANIAL NAVIGATION;  Surgeon: Judith Part, MD;  Location: [place];  Service: Neurosurgery;  Laterality: N/A;  RM 20   BREAST LUMPECTOMY Right    BREAST LUMPECTOMY WITH RADIOACTIVE SEED AND SENTINEL LYMPH NODE BIOPSY Right 02/16/2018   Procedure: RIGHT BREAST LUMPECTOMY WITH RADIOACTIVE SEED AND SENTINEL LYMPH NODE BIOPSY;  Surgeon: [name], MD;  Location: [place];  Service: General;  Laterality: Right;   CRANIOTOMY Right 12/06/2020   Procedure: Right sided awake craniotomy for tumor resection;  Surgeon: Judith Part, MD;  Location: [place];  Service: Neurosurgery;  Laterality: Right;   HYMENECTOMY     IR IMAGING GUIDED PORT INSERTION  03/12/2021   IR US GUIDE BX ASP/DRAIN  12/02/2020   KYPHOPLASTY N/A 03/07/2021   Procedure: Thoracic nine, Thoracic ten KYPHOPLASTY;  Surgeon: Judith Part, MD;  Location: [place];  Service: Neurosurgery;  Laterality: N/A;   PORTACATH PLACEMENT Right 02/16/2018   Procedure: INSERTION PORT-A-CATH;  Surgeon: [name], MD;  Location: [place];  Service: General;  Laterality: Right;   RE-EXCISION OF BREAST LUMPECTOMY Right 02/22/2018   Procedure: RE-EXCISION OF RIGHT  BREAST LUMPECTOMY;  Surgeon: [name], MD;  Location: [place];  Service: General;  Laterality: Right;   REPAIR VAGINAL CUFF N/A 02/07/2017   Procedure: REPAIR VAGINAL CUFF;  Surgeon: Ena Dawley, MD;  Location: [facility];  Service: Gynecology;  Laterality: N/A;   ROBOTIC ASSISTED TOTAL HYSTERECTOMY WITH SALPINGECTOMY  Left 01/20/2017   Procedure: ROBOTIC ASSISTED TOTAL HYSTERECTOMY WITH SALPINGECTOMY;  Surgeon: [name], MD;  Location: [facility];  Service: Gynecology;  Laterality: Left;   TOTAL HIP ARTHROPLASTY Right 2022   total hip and partial femur, per pt   Social  History  reports that she has never smoked. She has never used smokeless tobacco. She reports that she does not currently use alcohol. She reports that she does not use drugs.  No Known Allergies  Family History  Problem Relation Age of Onset   Lung cancer Maternal Grandfather    Esophageal cancer Paternal Grandfather 63   Breast cancer Cousin 21    Prior to Admission medications   Medication Sig Start Date End Date Taking? Authorizing Provider  acetaminophen (TYLENOL) 500 MG tablet Take 1 tablet (500 mg total) by mouth 3 (three) times daily as needed for mild pain (or Fever >/= 101). Take with ibuprofen 400 mg 02/10/21   Magrinat, [name], MD  b complex vitamins capsule Take 1 capsule by mouth daily. 900 mg    [provider]  capecitabine (XELODA) 500 MG tablet Take 1,500 mg by mouth 2 (two) times daily. Takes daily for 21 days, then 7 days off, then repeat cycle 04/25/21   [provider]  Cholecalciferol (VITAMIN D) 125 MCG (5000 UT) CAPS Take 5,000 Units by mouth daily. 06/20/20   Magrinat, [name], MD  dexamethasone (DECADRON) 4 MG tablet Take 2 tablets (8 mg total) by mouth daily. Start the day after chemotherapy for 2 days. Patient not taking: Reported on 05/03/2021 04/15/21   Magrinat, [name], MD  ferrous sulfate 325 (65 FE) MG tablet Take 325 mg by mouth daily with breakfast.    [provider]  fluconazole (DIFLUCAN) 100 MG tablet TAKE 1 TABLET BY MOUTH EVERY DAY Patient taking differently: Take 100 mg by mouth daily. 04/14/21   Magrinat, [name], MD  gabapentin (NEURONTIN) 300 MG capsule Take 1 capsule (300 mg total) by mouth 3 (three) times daily. 02/13/21   Magrinat, [name], MD  ibuprofen (ADVIL) 200 MG tablet Take 400 mg by mouth 3 (three) times daily as needed for mild pain (take with tylenol).    [provider]  mirtazapine (REMERON) 7.5 MG tablet Take 1 tablet (7.5 mg total) by mouth at bedtime. 05/03/21   Rosezetta Schlatter, MD   ondansetron (ZOFRAN ODT) 4 MG disintegrating tablet Take 1 tablet (4 mg total) by mouth See admin instructions. Dissolve 4 mg in the mouth three times a day and an additional 4 mg at bedtime as needed for nausea/vomiting 03/19/21   Magrinat, [name], MD  pantoprazole (PROTONIX) 40 MG tablet Take 40 mg by mouth daily. 02/06/21   [provider]  polyethylene glycol (MIRALAX / GLYCOLAX) 17 g packet Take 17 g by mouth daily as needed for moderate constipation. Patient not taking: Reported on 05/03/2021 01/21/21   Eugenie Filler, MD  pyridOXINE (VITAMIN B-6) 100 MG tablet Take 100 mg by mouth daily.    [provider]  sertraline (ZOLOFT) 25 MG tablet Take 1 tablet (25 mg total) by mouth daily. 05/04/21   Rosezetta Schlatter, MD  TUKYSA 150 MG tablet Take 150 mg by mouth 2 (two) times daily. 02/26/21   [provider]  valACYclovir (VALTREX) 1000 MG tablet Take 1 tablet (1,000 mg total) by mouth 2 (two) times daily. Take one tablet twice a day for one week, then daily Patient taking differently: Take 1,000 mg by mouth  daily. 04/15/21   Magrinat, [name], MD  prochlorperazine (COMPAZINE) 5 MG tablet Take 1 tablet (5 mg total) by mouth every 8 (eight) hours as needed for nausea or vomiting. 03/31/21 04/15/21  Magrinat, [name], MD    Physical Exam: Vitals with BMI 05/05/2021 05/05/2021 05/05/2021  Height - - -  Weight - - -  BMI - - -  Systolic 790 240 973  Diastolic 87 68 71  Pulse 532 96 101     Constitutional: Chronically ill-appearing.  NAD, calm, comfortable Eyes: PERRL, lids and conjunctivae normal ENMT: Mucous membranes are mildly dry.. Posterior pharynx clear of any exudate or lesions. Neck: normal, supple, no masses, no thyromegaly Respiratory: clear to auscultation bilaterally, no wheezing, no crackles. Normal respiratory effort. No accessory muscle use.  Cardiovascular: Sinus tachycardia in the 110s., no murmurs / rubs / gallops. No extremity edema. 2+ pedal  pulses. No carotid bruits.  Abdomen: No distention.  Bowel sounds positive.  Soft, mild epigastric tenderness, no masses palpated. No hepatosplenomegaly. Musculoskeletal: no clubbing / cyanosis. Good ROM, no contractures. Normal muscle tone.  Skin: no rashes, lesions, ulcers on limited dermatological examination. Neurologic: CN 2-12 grossly intact. Sensation intact, DTR normal. Strength 5/5 in all 4.  Psychiatric: Normal judgment and insight. Alert and oriented x 3. Normal mood.   Labs on Admission: I have personally reviewed following labs and imaging studies  CBC: Recent Labs  Lab 05/01/21 1116 05/03/21 0636 05/03/21 0643 05/03/21 2223 05/05/21 0658  WBC 2.4*  --  1.3* 1.3* 0.8*  NEUTROABS 2.0 0.9*  --   --  0.3*  HGB 11.2*  --  11.3* 10.3* 10.2*  HCT 34.0*  --  35.7* 31.5* 31.1*  MCV 100.6*  --  103.8* 101.6* 103.3*  PLT 151  --  142* 130* 131*    Basic Metabolic Panel: Recent Labs  Lab 05/01/21 1116 05/02/21 1848 05/03/21 2223 05/05/21 0658 05/05/21 1150  NA 132* 135 134* 133*  --   K 3.5 3.4* 3.0* 3.1*  --   CL 102 106 103 101  --   CO2 21* 20* 22 26  --   GLUCOSE 96 150* 130* 155*  --   BUN 18 20 11 7   --   CREATININE 0.56 0.66 0.52 0.45  --   CALCIUM 7.6* 7.5* 7.3* 7.0*  --   MG 2.1  --   --   --  1.9  PHOS 2.3*  --   --   --   --     GFR: Estimated Creatinine Clearance: 84.9 mL/min (by C-G formula based on SCr of 0.45 mg/dL).  Liver Function Tests: Recent Labs  Lab 05/01/21 1116 05/02/21 1848  AST 34 31  ALT 53* 49*  ALKPHOS 123 141*  BILITOT 1.0 1.0  PROT 6.0* 6.4*  ALBUMIN 3.3* 3.6   Urine analysis:    Component Value Date/Time   COLORURINE YELLOW 05/04/2021 1629   APPEARANCEUR HAZY (A) 05/04/2021 1629   LABSPEC 1.016 05/04/2021 1629   PHURINE 5.0 05/04/2021 1629   GLUCOSEU NEGATIVE 05/04/2021 1629   HGBUR NEGATIVE 05/04/2021 1629   BILIRUBINUR NEGATIVE 05/04/2021 1629   Late[place] 05/04/2021 1629   PROTEINUR NEGATIVE 05/04/2021  1629   NITRITE POSITIVE (A) 05/04/2021 1629   LEUKOCYTESUR NEGATIVE 05/04/2021 1629   Radiological Exams on Admission: DG Chest 2 View  Result Date: 05/03/2021 CLINICAL DATA:  Shortness of breath. Weakness and fatigue. Breast cancer with active chemotherapy. EXAM: CHEST - 2 VIEW COMPARISON:  Radiograph 11/27/2020,  CT 04/14/2021 FINDINGS: Right chest port with tip in the SVC. Nodular opacity in the right mid lung just inferior to the site of port, corresponds to nodular opacities on CT. The lungs are otherwise clear. No pulmonary edema, pleural effusion, or pneumothorax. Slight volume loss in the right hemithorax is stable from prior CT. Surgical clips in the right axilla. Kyphoplasty within T9 and T10. Similar compression fractures in the thoracic spine. Osseous metastatic disease with patchy areas of sclerosis, assessed on recent CT. IMPRESSION: 1. Stable nodular consolidation in the right mid lung compared with recent CT. 2. No acute intrathoracic findings. 3. Osseous metastatic disease. Electronically Signed   By: Keith Rake M.D.   On: 05/03/2021 18:35    EKG: Independently reviewed.   Assessment/Plan Principal Problem:   Febrile neutropenia (HCC) Admit to MedSurg/inpatient. Continue IV fluids. Acetaminophen as needed. Continue cefepime 2 g every 8 hours. Antiemetics as needed. Follow-up blood culture and sensitivity.  Active Problems:  Hypokalemia Replacing. Follow potassium level.    Suicidal ideation   Major depressive disorder Continue Remeron.  7.5 mg at bedtime. Continue sertraline 25 mg p.o. daily. One-to-one observation. Consult behavioral health.    Diarrhea Continue IV fluids. Check stool C. difficile toxin.    Malignant neoplasm of upper-inner quadrant  of right breast in female,  estrogen receptor positive ([place]) Continue follow-up with oncology.     DVT prophylaxis: Lovenox SQ. Code Status:   Full code. Family Communication:   Disposition Plan:    Patient is from:  Behavioral health facility.  Anticipated DC to:  TBD.  Home vs [facility]  Anticipated DC date:  05/07/2021 or 05/08/2021.  Anticipated DC barriers: Clinical status.  Consults called:   Behavioral health. Oncology  Admission status:  Inpatient/MedSurg.   Severity of Illness: High severity after presenting with fever, nausea, vomiting and abdominal pain in the setting of neutropenic fever.  The patient will be admitted for IV fluids and IV antibiotic therapy.  Youlanda Roys. Olevia Bowens, MD [facility]  05/05/2021, 4:35 PM   This document was prepared using Dragon voice recognition software may contain some unintended transcription errors.

## 2021-05-05 NOTE — Progress Notes (Signed)
Monica [name]   DOB:1970/10/06   PJ#:[id number]   FQH#:[id number]  Subjective:  Monica [name] was transferred from behavioral health to the ED because she developed a temperature greater than 102.  She is neutropenic from her Enhertu treatment 04/23/2021.  Quite aside from the fever and neutropenia she continues to be very weak and depressed.  She has been in bed most of the time at behavioral health she says that she found that it was more depressing than being alone in her apartment.  Aside from that she is having some thrush, absolutely no appetite, no sense of taste, tells me she has been vomiting constantly, and has developed severe watery diarrhea.   Objective:  There were no vitals filed for this visit.  There is no height or weight on file to calculate BMI. No intake or output data in the 24 hours ending 05/05/21 1724   Sclerae unicteric  Oropharynx shows mild thrush  Lungs no rales or wheezes--auscultated anterolaterally  Heart regular rate and rhythm  Abdomen soft, +BS  Neuro nonfocal   CBG (last 3)  No results for input(s): GLUCAP in the last 72 hours.   Labs:  Lab Results  Component Value Date   WBC 0.8 (LL) 05/05/2021   HGB 10.2 (L) 05/05/2021   HCT 31.1 (L) 05/05/2021   MCV 103.3 (H) 05/05/2021   PLT 131 (L) 05/05/2021   NEUTROABS 0.3 (LL) 05/05/2021    '@LASTCHEMISTRY' @  Urine Studies No results for input(s): UHGB, CRYS in the last 72 hours.  Invalid input(s): UACOL, UAPR, USPG, UPH, UTP, UGL, UKET, UBIL, UNIT, UROB, [place], UEPI, UWBC, Junie P[facility] [place], [place], [place]  Basic Metabolic Panel: Recent Labs  Lab 05/01/21 1116 05/02/21 1848 05/03/21 2223 05/05/21 0658 05/05/21 1150  NA 132* 135 134* 133*  --   K 3.5 3.4* 3.0* 3.1*  --   CL 102 106 103 101  --   CO2 21* 20* 22 26  --   GLUCOSE 96 150* 130* 155*  --   BUN '18 20 11 7  ' --   CREATININE 0.56 0.66 0.52 0.45  --   CALCIUM 7.6* 7.5* 7.3* 7.0*  --   MG 2.1  --   --   --  1.9  PHOS 2.3*  --   --   --   --     GFR Estimated Creatinine Clearance: 84.9 mL/min (by C-G formula based on SCr of 0.45 mg/dL). Liver Function Tests: Recent Labs  Lab 05/01/21 1116 05/02/21 1848  AST 34 31  ALT 53* 49*  ALKPHOS 123 141*  BILITOT 1.0 1.0  PROT 6.0* 6.4*  ALBUMIN 3.3* 3.6   No results for input(s): LIPASE, AMYLASE in the last 168 hours. No results for input(s): AMMONIA in the last 168 hours. Coagulation profile No results for input(s): INR, PROTIME in the last 168 hours.  CBC: Recent Labs  Lab 05/01/21 1116 05/03/21 0636 05/03/21 0643 05/03/21 2223 05/05/21 0658  WBC 2.4*  --  1.3* 1.3* 0.8*  NEUTROABS 2.0 0.9*  --   --  0.3*  HGB 11.2*  --  11.3* 10.3* 10.2*  HCT 34.0*  --  35.7* 31.5* 31.1*  MCV 100.6*  --  103.8* 101.6* 103.3*  PLT 151  --  142* 130* 131*   Cardiac Enzymes: No results for input(s): CKTOTAL, CKMB, CKMBINDEX, TROPONINI in the last 168 hours. BNP: Invalid input(s): POCBNP CBG: No results for input(s): GLUCAP in the last 168 hours. D-Dimer No results for input(s): DDIMER in the last  72 hours. Hgb A1c No results for input(s): HGBA1C in the last 72 hours. Lipid Profile No results for input(s): CHOL, HDL, LDLCALC, TRIG, CHOLHDL, LDLDIRECT in the last 72 hours. Thyroid function studies No results for input(s): TSH, T4TOTAL, T3FREE, THYROIDAB in the last 72 hours.  Invalid input(s): FREET3 Anemia work up No results for input(s): VITAMINB12, FOLATE, FERRITIN, TIBC, IRON, RETICCTPCT in the last 72 hours. Microbiology Recent Results (from the past 240 hour(s))  Resp Panel by RT-PCR (Flu A&B, Covid) Nasopharyngeal Swab     Status: None   Collection Time: 05/01/21 10:39 AM   Specimen: Nasopharyngeal Swab; Nasopharyngeal(NP) swabs in vial transport medium  Result Value Ref Range Status   SARS Coronavirus 2 by RT PCR NEGATIVE NEGATIVE Final    Comment: (NOTE) SARS-CoV-2 target nucleic acids are NOT DETECTED.  The SARS-CoV-2 RNA is generally detectable in upper  respiratory specimens during the acute phase of infection. The lowest concentration of SARS-CoV-2 viral copies this assay can detect is 138 copies/mL. A negative result does not preclude SARS-Cov-2 infection and should not be used as the sole basis for treatment or other patient management decisions. A negative result may occur with  improper specimen collection/handling, submission of specimen other than nasopharyngeal swab, presence of viral mutation(s) within the areas targeted by this assay, and inadequate number of viral copies(<138 copies/mL). A negative result must be combined with clinical observations, patient history, and epidemiological information. The expected result is Negative.  Fact Sheet for Patients:  EntrepreneurPulse.com.au  Fact Sheet for Healthcare Providers:  IncredibleEmployment.be  This test is no t yet approved or cleared by the [place] FDA and  has been authorized for detection and/or diagnosis of SARS-CoV-2 by FDA under an Emergency Use Authorization (EUA). This EUA will remain  in effect (meaning this test can be used) for the duration of the COVID-19 declaration under Section 564(b)(1) of the Act, 21 U.S.C.section 360bbb-3(b)(1), unless the authorization is terminated  or revoked sooner.       Influenza A by PCR NEGATIVE NEGATIVE Final   Influenza B by PCR NEGATIVE NEGATIVE Final    Comment: (NOTE) The Xpert Xpress SARS-CoV-2/FLU/RSV plus assay is intended as an aid in the diagnosis of influenza from Nasopharyngeal swab specimens and should not be used as a sole basis for treatment. Nasal washings and aspirates are unacceptable for Xpert Xpress SARS-CoV-2/FLU/RSV testing.  Fact Sheet for Patients: EntrepreneurPulse.com.au  Fact Sheet for Healthcare Providers: IncredibleEmployment.be  This test is not yet approved or cleared by the [place] FDA and has been  authorized for detection and/or diagnosis of SARS-CoV-2 by FDA under an Emergency Use Authorization (EUA). This EUA will remain in effect (meaning this test can be used) for the duration of the COVID-19 declaration under Section 564(b)(1) of the Act, 21 U.S.C. section 360bbb-3(b)(1), unless the authorization is terminated or revoked.  Performed at [facility], [place] [address], [place],  [address]   Resp Panel by RT-PCR (Flu A&B, Covid) Nasopharyngeal Swab     Status: None   Collection Time: 05/05/21  1:46 PM   Specimen: Nasopharyngeal Swab; Nasopharyngeal(NP) swabs in vial transport medium  Result Value Ref Range Status   SARS Coronavirus 2 by RT PCR NEGATIVE NEGATIVE Final    Comment: (NOTE) SARS-CoV-2 target nucleic acids are NOT DETECTED.  The SARS-CoV-2 RNA is generally detectable in upper respiratory specimens during the acute phase of infection. The lowest concentration of SARS-CoV-2 viral copies this assay can detect is 138 copies/mL. A negative  result does not preclude SARS-Cov-2 infection and should not be used as the sole basis for treatment or other patient management decisions. A negative result may occur with  improper specimen collection/handling, submission of specimen other than nasopharyngeal swab, presence of viral mutation(s) within the areas targeted by this assay, and inadequate number of viral copies(<138 copies/mL). A negative result must be combined with clinical observations, patient history, and epidemiological information. The expected result is Negative.  Fact Sheet for Patients:  EntrepreneurPulse.com.au  Fact Sheet for Healthcare Providers:  IncredibleEmployment.be  This test is no t yet approved or cleared by the [place] FDA and  has been authorized for detection and/or diagnosis of SARS-CoV-2 by FDA under an Emergency Use Authorization (EUA). This EUA will remain  in effect  (meaning this test can be used) for the duration of the COVID-19 declaration under Section 564(b)(1) of the Act, 21 U.S.C.section 360bbb-3(b)(1), unless the authorization is terminated  or revoked sooner.       Influenza A by PCR NEGATIVE NEGATIVE Final   Influenza B by PCR NEGATIVE NEGATIVE Final    Comment: (NOTE) The Xpert Xpress SARS-CoV-2/FLU/RSV plus assay is intended as an aid in the diagnosis of influenza from Nasopharyngeal swab specimens and should not be used as a sole basis for treatment. Nasal washings and aspirates are unacceptable for Xpert Xpress SARS-CoV-2/FLU/RSV testing.  Fact Sheet for Patients: EntrepreneurPulse.com.au  Fact Sheet for Healthcare Providers: IncredibleEmployment.be  This test is not yet approved or cleared by the [place] FDA and has been authorized for detection and/or diagnosis of SARS-CoV-2 by FDA under an Emergency Use Authorization (EUA). This EUA will remain in effect (meaning this test can be used) for the duration of the COVID-19 declaration under Section 564(b)(1) of the Act, 21 U.S.C. section 360bbb-3(b)(1), unless the authorization is terminated or revoked.  Performed at [facility], [place] [address], [place], [place] [address]       Studies:  DG Chest 2 View  Result Date: 05/03/2021 CLINICAL DATA:  Shortness of breath. Weakness and fatigue. Breast cancer with active chemotherapy. EXAM: CHEST - 2 VIEW COMPARISON:  Radiograph 11/27/2020, CT 04/14/2021 FINDINGS: Right chest port with tip in the SVC. Nodular opacity in the right mid lung just inferior to the site of port, corresponds to nodular opacities on CT. The lungs are otherwise clear. No pulmonary edema, pleural effusion, or pneumothorax. Slight volume loss in the right hemithorax is stable from prior CT. Surgical clips in the right axilla. Kyphoplasty within T9 and T10. Similar compression fractures in the thoracic  spine. Osseous metastatic disease with patchy areas of sclerosis, assessed on recent CT. IMPRESSION: 1. Stable nodular consolidation in the right mid lung compared with recent CT. 2. No acute intrathoracic findings. 3. Osseous metastatic disease. Electronically Signed   By: Keith Rake M.D.   On: 05/03/2021 18:35    Assessment: 50 y.o.  [place], [place] woman status post right breast upper inner quadrant biopsy 12/29/2017, for a clinical T1c N0, stage IA invasive ductal carcinoma, triple positive, with an MIB-1 of 80%.   (1) genetics testing 02/02/2018 though the CancerNext gene panel offered by [facility] showed no deleterious mutations in  APC, ATM, BARD1, BMPR1A,BRCA1, BRCA2, BRIP1, CDH1, CDK4, CDKN2A, CHEK2, DICER1, HOXB13, MLH1, MRE11A, MSH2, MSH6, MUTYH, NBN, NF1, PALB2, PMS2, POLD1, POLE, PTEN, RAD50, RAD51C, RAD51D, SMAD4, SMARCA4, STK11 and TP53 (sequencing and deletion/duplication); EPCAM and GREM1 (deletion/duplication only).             (a)  a variant of unknown significance noted in MSH6  (p.V110I (c.328G>A) )    (2) right lumpectomy and sentinel lymph node sampling 02/16/2018 showed a pT2 pN0, stage IB invasive ductal carcinoma, grade 3, with a positive inferior margin; a total of 5 lymph nodes were removed             (a) additional surgery 02/27/2018 cleared the margins   (3) started carboplatin, docetaxel, trastuzumab and pertuzumab 03/11/2018, repeated every 21 days x 6, last dose 07/18/2018.               (a) Pertuzumab omitted after cycle 1 due to diarrhea.             (b) Gemcitabine substituted for docetaxel starting with cycle 5 due to peripheral neuropathy   (4) continued trastuzumab to total 6 months (through February 2020).             (a) echocardiogram 01/21/2018 showed an ejection fraction in the 55-60% range             (b) repeat echocardiogram 06/14/2018 showed an ejection fraction in the 60-65%   (5) adjuvant radiation 08/15/2018 - 09/28/2018  Site/dose:    The patient initially received a dose of 50.4 Gy in 28 fractions to the right breast using whole-breast tangent fields. This was delivered using a 3-D conformal technique. The patient then received a boost to the seroma. This delivered an additional 10 Gy in 5 fractions using 12E, 9E electrons with a special teletherapy technique. The total dose was 60.4 Gy.    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, involving brain, liver, lungs, and bones (7) presenting 11/29/2020 with progressive back pain:             (a) non-contrast CT abd/pelvis (renal stone study) 11/29/2020 shows bone metastases             (b) CT chest/abd/pelvis with contrast 11/30/2020 shows multiple large liver masses, multiple pulmonary nodules, and widespread lytic bone lesions             (c) MRI brain shows at least 4 brain lesions, largest 3.8 cm             (d) total spinal MRI 11/30/2020 shows pathologic fractures at T9, T12, possibly L2, as well as numerous other spine lesions, but no epidural enhancement or cord compression             (e) liver biopsy 12/02/2020 shows adenocarcinoma, prognostic panel again triple positive             (f) pre-op repeat MRI 12/01/2020 shows additional brain lesions, at least 8 of which will be targets for SRS   (8) CNS treatment:             (a) [place] 12/05/2020             (b) surgery 12/06/2020 confirmed metastatic carcinoma, estrogen receptor positive, HER2 amplified, progesterone receptor negative, with an MIB-1 of 70%.             (c) brain MRI 03/06/2021 shows minimal enhancement along the resection margin,  previously noted lesions stable or decreased in size, some no longer seen, and no new brain lesions   (9) XRT to spine 12/05/2020 through  12/23/2020 Site Technique Total Dose (Gy) Dose per Fx (Gy) Completed Fx Beam Energies  Thoracic Spine: Spine_T9-L3 Complex 30/30 3 10/10 15X  Pelvis: Pelvis_sacrum Complex 30/30 3 10/10 15X    (10) advanced directives-- plans to name son as HCPOA with her father as secondary   (49) started capecitabine 01/08/2021, trastuzumab 01/20/2021, tucatinib 02/10/2021             (a) echo 01/14/2021 shows an ejection fraction in the 60-65% range.             (b) capecitabine and trastuzumab discontinued 04/15/2021 with evidence of progression             (c) tucatinib continued   (12) started denosumab/Xgeva 02/10/2021, repeated every 6 weeks             (a) received one dose pamidronate in hospital 01/16/2021   (13) pain management:             (a) Tylenol 500 mg and Advil 400 mg 3 times a day w as needed             (b) gabapentin 3 times a day as needed             (c) tramadol 50 mg 4 times a day as needed              (d) bowel prophylaxis: MiraLAX daily, stool softeners 2 tablets twice daily as needed   (14) status post T9 and T10 kyphoplasty 03/07/2021   (15) Enhertu started 04/23/2021  (16) febrile neutropenis 05/05/2021   Plan:  Dreyah is now day 13 cycle 1 Enhertu. She has profound neutropenia and has developed a fever. She also has severe watery diarrhea, which is a new development. Urine, blood and stool cultures pending. Currently day 1 cefepime  She tells me her parents are on the way from [place] and likely when she is discharged from the hospital she will go home with them.   Siham tells me she is clear she does not want further chemotherapy. Despite that she will need to connect with a medical oncologist locally in [place] and we will help them operationalize that.   Added fluids but feel free to modify as more labs come in. Her port is accessed. Also wrote the zofran, low dose, to begiven RTC. Requested a nutrition consult.  Appreciate your care to this patient. Will follow  with you  Chauncey Cruel, MD 05/05/2021  5:24 PM Medical Oncology and Hematology [facility] [address] [place], [place] [address] Tel. [phone]    Fax. [phone]

## 2021-05-05 NOTE — Progress Notes (Signed)
A consult was received from an ED physician for vancomycin and cefepime per pharmacy dosing.  The patient's profile has been reviewed for ht/wt/allergies/indication/available labs.   A one time order has been placed for vancomycin 1500mg  IV x1, cefepime 2g IV x1, and Flagyl 500mg  IV x1 (per MD).    Further antibiotics/pharmacy consults should be ordered by admitting physician if indicated.                       Thank you,  Dimple Nanas, PharmD 05/05/2021 11:14 AM

## 2021-05-06 ENCOUNTER — Encounter: Payer: Self-pay | Admitting: Oncology

## 2021-05-06 ENCOUNTER — Other Ambulatory Visit: Payer: Self-pay | Admitting: Oncology

## 2021-05-06 ENCOUNTER — Other Ambulatory Visit: Payer: 59

## 2021-05-06 ENCOUNTER — Telehealth: Payer: Self-pay | Admitting: *Deleted

## 2021-05-06 ENCOUNTER — Ambulatory Visit: Payer: 59

## 2021-05-06 DIAGNOSIS — F323 Major depressive disorder, single episode, severe with psychotic features: Secondary | ICD-10-CM | POA: Diagnosis not present

## 2021-05-06 DIAGNOSIS — E440 Moderate protein-calorie malnutrition: Secondary | ICD-10-CM | POA: Insufficient documentation

## 2021-05-06 DIAGNOSIS — E876 Hypokalemia: Secondary | ICD-10-CM

## 2021-05-06 DIAGNOSIS — R197 Diarrhea, unspecified: Secondary | ICD-10-CM

## 2021-05-06 DIAGNOSIS — F32A Depression, unspecified: Secondary | ICD-10-CM

## 2021-05-06 DIAGNOSIS — C50919 Malignant neoplasm of unspecified site of unspecified female breast: Secondary | ICD-10-CM

## 2021-05-06 DIAGNOSIS — D709 Neutropenia, unspecified: Secondary | ICD-10-CM | POA: Diagnosis not present

## 2021-05-06 LAB — COMPREHENSIVE METABOLIC PANEL
ALT: 26 U/L (ref 0–44)
AST: 21 U/L (ref 15–41)
Albumin: 2.5 g/dL — ABNORMAL LOW (ref 3.5–5.0)
Alkaline Phosphatase: 125 U/L (ref 38–126)
Anion gap: 7 (ref 5–15)
BUN: 13 mg/dL (ref 6–20)
CO2: 24 mmol/L (ref 22–32)
Calcium: 6.3 mg/dL — CL (ref 8.9–10.3)
Chloride: 105 mmol/L (ref 98–111)
Creatinine, Ser: 0.53 mg/dL (ref 0.44–1.00)
GFR, Estimated: >60 mL/min (ref >60)
Glucose, Bld: 121 mg/dL — ABNORMAL HIGH (ref 70–99)
Potassium: 3 mmol/L — ABNORMAL LOW (ref 3.5–5.1)
Sodium: 136 mmol/L (ref 135–145)
Total Bilirubin: 1.3 mg/dL — ABNORMAL HIGH (ref 0.3–1.2)
Total Protein: 5.4 g/dL — ABNORMAL LOW (ref 6.5–8.1)

## 2021-05-06 LAB — CBC WITH DIFFERENTIAL/PLATELET
Abs Immature Granulocytes: 0.1 10*3/uL — ABNORMAL HIGH (ref 0.00–0.07)
Band Neutrophils: 4 %
Basophils Absolute: 0 10*3/uL (ref 0.0–0.1)
Basophils Relative: 0 %
Eosinophils Absolute: 0 10*3/uL (ref 0.0–0.5)
Eosinophils Relative: 2 %
HCT: 30.6 % — ABNORMAL LOW (ref 36.0–46.0)
Hemoglobin: 10.1 g/dL — ABNORMAL LOW (ref 12.0–15.0)
Lymphocytes Relative: 9 %
Lymphs Abs: 0.1 10*3/uL — ABNORMAL LOW (ref 0.7–4.0)
MCH: 33.1 pg (ref 26.0–34.0)
MCHC: 33 g/dL (ref 30.0–36.0)
MCV: 100.3 fL — ABNORMAL HIGH (ref 80.0–100.0)
Monocytes Absolute: 0.1 10*3/uL (ref 0.1–1.0)
Monocytes Relative: 4 %
Myelocytes: 6 %
Neutro Abs: 1.3 10*3/uL — ABNORMAL LOW (ref 1.7–7.7)
Neutrophils Relative %: 75 %
Platelets: 145 10*3/uL — ABNORMAL LOW (ref 150–400)
RBC: 3.05 MIL/uL — ABNORMAL LOW (ref 3.87–5.11)
RDW: 22.4 % — ABNORMAL HIGH (ref 11.5–15.5)
WBC: 1.6 10*3/uL — ABNORMAL LOW (ref 4.0–10.5)
nRBC: 1.3 % — ABNORMAL HIGH (ref 0.0–0.2)

## 2021-05-06 LAB — C DIFFICILE QUICK SCREEN W PCR REFLEX
C Diff antigen: NEGATIVE
C Diff interpretation: NOT DETECTED
C Diff toxin: NEGATIVE

## 2021-05-06 MED ORDER — ACETAMINOPHEN 650 MG RE SUPP: 650.0000 mg | Freq: Four times a day (QID) | RECTAL | Status: DC | PRN

## 2021-05-06 MED ORDER — TUCATINIB 150 MG PO TABS
150.0000 mg | ORAL_TABLET | Freq: Two times a day (BID) | ORAL | Status: DC
  Administered 2021-05-06 – 2021-05-11 (×11): 150 mg via ORAL
  Filled 2021-05-06 (×3): qty 1

## 2021-05-06 MED ORDER — ADULT MULTIVITAMIN W/MINERALS CH
1.0000 | ORAL_TABLET | Freq: Every day | ORAL | Status: DC
  Administered 2021-05-06 – 2021-05-11 (×6): 1 via ORAL
  Filled 2021-05-06 (×6): qty 1

## 2021-05-06 MED ORDER — LOPERAMIDE HCL 2 MG PO CAPS
2.0000 mg | ORAL_CAPSULE | ORAL | Status: AC | PRN
  Administered 2021-05-06 – 2021-05-07 (×2): 2 mg via ORAL
  Filled 2021-05-06 (×2): qty 1

## 2021-05-06 MED ORDER — CHLORHEXIDINE GLUCONATE CLOTH 2 % EX PADS
6.0000 | MEDICATED_PAD | Freq: Every day | CUTANEOUS | Status: DC
  Administered 2021-05-06 – 2021-05-11 (×6): 6 via TOPICAL

## 2021-05-06 MED ORDER — ENSURE ENLIVE PO LIQD
237.0000 mL | Freq: Three times a day (TID) | ORAL | Status: DC
  Administered 2021-05-06 – 2021-05-09 (×4): 237 mL via ORAL

## 2021-05-06 MED ORDER — POTASSIUM CHLORIDE CRYS ER 20 MEQ PO TBCR
40.0000 meq | EXTENDED_RELEASE_TABLET | Freq: Two times a day (BID) | ORAL | Status: AC
  Administered 2021-05-06: 40 meq via ORAL
  Filled 2021-05-06 (×2): qty 2

## 2021-05-06 MED ORDER — GUAIFENESIN-DM 100-10 MG/5ML PO SYRP
5.0000 mL | ORAL_SOLUTION | ORAL | Status: DC | PRN
  Administered 2021-05-06: 5 mL via ORAL
  Filled 2021-05-06: qty 10

## 2021-05-06 MED ORDER — ACETAMINOPHEN 325 MG PO TABS
650.0000 mg | ORAL_TABLET | Freq: Four times a day (QID) | ORAL | Status: DC | PRN
  Administered 2021-05-06: 650 mg via ORAL
  Filled 2021-05-06 (×3): qty 2

## 2021-05-06 NOTE — Progress Notes (Signed)
Chaplain engaged in an initial visit with [place].  Avi was ok with Chaplain stopping in but she provided one word answers throughout conversation.  She did share about being home by herself and how she often just sits in a dark room silently.  She shared that she does not have any immediate family or support living close by and that she lives alone.  Pattijo appears to be very isolated.  Kenli also shared that her parents are coming to get her from [place] and that she will be staying with them.  She stated that she knew something was wrong when she couldn't get out of bed due to her mental state.    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  She voiced that she is no longer able to do a lot of things she enjoys due to losing her eyesight.    Chaplain asked Teniyah about listening to music, watching television or opening the blinds.  She voiced that she doesn't like listening to music.  Chaplain shared about how those things can be helpful to mental state.    Chaplain will continue to follow-up and offer support. Chaplain offered a prayer over her which she welcomed.     05/06/21 1600  Clinical Encounter Type  Visited With Patient  Visit Type Initial

## 2021-05-06 NOTE — Telephone Encounter (Signed)
This RN called to [facility] at [phone] and requested [place] [facility] -navigator.  Obtained identified VM for [facility]- detailed message left informing records for this patient will be faxed per need of urgent appointment next week per pt relocating to be with family. This RN left her name and direct desk number for contact if needed.  This RN then faxed records including demographic information and request to contact the pt's mother for appointment with confirmation fax was received.

## 2021-05-06 NOTE — Evaluation (Signed)
Physical Therapy Evaluation Only Patient Details Name: Monica [name] MRN: [id number] DOB: 09-03-1970 Today's Date: 05/06/2021  History of Present Illness  Monica [name] is a 50 yo female who presents for concern of neutropenia and fever. Of note, pt recently admitted to [facility] 10/20-10/24 for depression. PMH: breast CA s/p resection, radiation currently on antineoplastic treatment   Clinical Impression  Pt ambulates around room pushing IV pole around obstacles, completing turns, navigates into restroom holding onto nothing without LOB, slight R trendelenburg noted, pt reports chronic R hip pain and weakness. Pt living in 2nd story apartment alone at baseline, independent and using RW or SPC when feeling "weak". Pt reports feeling normal and at baseline, reports her parents are moving in with her from [place] to care for her. No acute PT needs identified at this time, will sign off.     Recommendations for follow up therapy are one component of a multi-disciplinary discharge planning process, led by the attending physician.  Recommendations may be updated based on patient status, additional functional criteria and insurance authorization.  Follow Up Recommendations No PT follow up    Assistance Recommended at Discharge    Functional Status Assessment Patient has not had a recent decline in their functional status  Equipment Recommendations  None recommended by PT    Recommendations for Other Services       Precautions / Restrictions Precautions Precautions: Fall Restrictions Weight Bearing Restrictions: No      Mobility  Bed Mobility Overal bed mobility: Independent   Transfers Overall transfer level: Modified independent  General transfer comment: slow to power to stand, good steadiness upon rising, no AD    Ambulation/Gait Ambulation/Gait assistance: Supervision Gait Distance (Feet): 25 Feet Assistive device: IV Pole Gait Pattern/deviations: Step-through pattern;Decreased stride  length;Trendelenburg;WFL(Within Functional Limits) Gait velocity: decreased   General Gait Details: slight R trendelenburg that pt reports is "normal", step through pattern pushing IV pole around room and into restroom, decreased cadence, no LOB, supv for safety  Stairs            Wheelchair Mobility    Modified Rankin (Stroke Patients Only)       Balance Overall balance assessment: Mild deficits observed, not formally tested        Pertinent Vitals/Pain Pain Assessment: No/denies pain    Home Living Family/patient expects to be discharged to:: Private residence Living Arrangements: Alone (pt reports parents from [place] are moving in with her) Available Help at Discharge: Family;Available 24 hours/day Type of Home: Apartment (2nd floor) Home Access: Stairs to enter Entrance Stairs-Rails: Right (ascending) Entrance Stairs-Number of Steps: flight   Home Layout: One level Home Equipment: Conservation officer, nature (2 wheels);Cane - single point Additional Comments: Pt reports parents are coming to stay with her and assist as needed.    Prior Function Prior Level of Function : Independent/Modified Independent  Mobility Comments: ind/mod I with ambulation using SPC and RW when feeling "unsteady" ADLs Comments: pt reports ind with ADLs/IADLs, daughter picks up groceries due to shopping is "overwhelming"     Hand Dominance   Dominant Hand: Left    Extremity/Trunk Assessment   Upper Extremity Assessment Upper Extremity Assessment: Overall WFL for tasks assessed    Lower Extremity Assessment Lower Extremity Assessment: Overall WFL for tasks assessed (AROM WNL, strength 4-/5 throughout, symmetrical)    Cervical / Trunk Assessment Cervical / Trunk Assessment: Normal  Communication   Communication: No difficulties  Cognition Arousal/Alertness: Awake/alert Behavior During Therapy: Flat affect Overall Cognitive Status: Within Functional Limits  for tasks assessed          General Comments      Exercises     Assessment/Plan    PT Assessment Patient does not need any further PT services  PT Problem List         PT Treatment Interventions      PT Goals (Current goals can be found in the Care Plan section)  Acute Rehab PT Goals Patient Stated Goal: return home with family to assist PT Goal Formulation: All assessment and education complete, DC therapy    Frequency     Barriers to discharge        Co-evaluation               AM-PAC PT "6 Clicks" Mobility  Outcome Measure Help needed turning from your back to your side while in a flat bed without using bedrails?: None Help needed moving from lying on your back to sitting on the side of a flat bed without using bedrails?: None Help needed moving to and from a bed to a chair (including a wheelchair)?: None Help needed standing up from a chair using your arms (e.g., wheelchair or bedside chair)?: None Help needed to walk in hospital room?: A Little Help needed climbing 3-5 steps with a railing? : A Little 6 Click Score: 22    End of Session   Activity Tolerance: Patient tolerated treatment well Patient left: in bed;with call bell/phone within reach;with nursing/sitter in room Nurse Communication: Mobility status;Other (comment) (stool sample) PT Visit Diagnosis: Other abnormalities of gait and mobility (R26.89)    Time: 3664-4034 PT Time Calculation (min) (ACUTE ONLY): 15 min   Charges:   PT Evaluation $PT Eval Low Complexity: 1 Low           Monica [name] PT, DPT 05/06/21, 1:01 PM

## 2021-05-06 NOTE — Telephone Encounter (Signed)
No entry 

## 2021-05-06 NOTE — Progress Notes (Signed)
Initial Nutrition Assessment  DOCUMENTATION CODES:   Non-severe (moderate) malnutrition in context of chronic illness  INTERVENTION:   -Ensure Enlive po TID, each supplement provides 350 kcal and 20 grams of protein  -Multivitamin with minerals daily  NUTRITION DIAGNOSIS:   Moderate Malnutrition related to chronic illness, cancer and cancer related treatments as evidenced by mild muscle depletion, energy intake < or equal to 75% for > or equal to 1 month, percent weight loss.  GOAL:   Patient will meet greater than or equal to 90% of their needs  MONITOR:   PO intake, Supplement acceptance, Labs, Weight trends, I & O's  REASON FOR ASSESSMENT:   Consult Assessment of nutrition requirement/status  ASSESSMENT:   50 y.o. female with a PMH significant for osteoarthritis, depression, metastatic breast cancer, recent behavioral health admission for suicidal ideations.  They presented from behavioral health hospital to the ED on 05/05/2021 with abdominal pain, nausea, vomiting x 1 days. In the ED, it was found that they had a fever with low white blood cell count. They were treated with IV antibiotics, IV fluids, Tylenol.  Heme/onc was consulted.   Patient was admitted to medicine service for further workup and management of neutropenic fever.  Patient in room with sitter. Pt very flat in affect but answered all of RD's questions. Pt states she has not eaten well with good appetite since a month ago. Pt has no taste or smell, reports worsening vision. Being treated for thrush currently, states it hurts to eat. Developed N/V 1 week PTA and did not eat at all during this time.   Pt was brought to [facility] d/t suicidal ideation.  Per oncology note, pt wants no more chemo, last 10/12. Has continued to have diarrhea.   Pt states she ate some oatmeal for breakfast this morning. Doesn't plan to order anything for lunch but willing to drink an Ensure.  Per weight records, pt has lost 23 lbs  since 6/30 (13% wt loss x 4 months, significant for time frame).  Medications: Remeron, Zofran, KLOR-CON  Labs reviewed: Low K   NUTRITION - FOCUSED PHYSICAL EXAM:  Flowsheet Row Most Recent Value  Orbital Region No depletion  Upper Arm Region Mild depletion  Thoracic and Lumbar Region Unable to assess  Buccal Region No depletion  Temple Region No depletion  Clavicle Bone Region No depletion  Clavicle and Acromion Bone Region No depletion  Scapular Bone Region No depletion  Dorsal Hand Mild depletion  Patellar Region Mild depletion  Anterior Thigh Region Mild depletion  Posterior Calf Region Moderate depletion  Edema (RD Assessment) None  Hair Reviewed  [cut short, on chemo]  Eyes Reviewed  [reports worsened vision -d/t cancer treatment]  Mouth Reviewed  [thrush, sores]  Skin Reviewed  Nails Reviewed       Diet Order:   Diet Order             Diet Heart Room service appropriate? Yes; Fluid consistency: Thin  Diet effective now                   EDUCATION NEEDS:   No education needs have been identified at this time  Skin:  Skin Assessment: Reviewed RN Assessment  Last BM:  10/25 -type 7  Height:   Ht Readings from Last 1 Encounters:  05/06/21 5\' 8"  (1.727 m)    Weight:   Wt Readings from Last 1 Encounters:  05/06/21 66.7 kg    BMI:  Body mass index is 22.35  kg/m.  Estimated Nutritional Needs:   Kcal:  2050-2250  Protein:  100-110g  Fluid:  2.1L/day   Clayton Bibles, MS, RD, LDN Inpatient Clinical Dietitian Contact information available via Amion

## 2021-05-06 NOTE — Consult Note (Signed)
[facility] Face-to-Face Psychiatry Consult   Reason for Consult:  Suicidal and Depression Referring Physician: Emergency room provider Patient Identification: Monica [name] MRN:  [id number] Principal Diagnosis: Febrile neutropenia ([place]) Diagnosis:  Principal Problem:   Febrile neutropenia ([place]) Active Problems:   Malignant neoplasm of upper-inner quadrant of right breast in female, estrogen receptor positive ([place])   Hypokalemia   Suicidal ideation   Major depressive disorder   Diarrhea   Total Time spent with patient: 45 minutes  Subjective:   Monica [name] is a 50 y.o. female patient admitted with depression and suicidal ideations.  Patient presents from [facility], with a current diagnosis of febrile neutropenia in which she developed of high fever, low white blood cell count, and is currently being managed by Dr. Jana Hakim hematologist.  During her 3-day stay at [facility], she was started on Zoloft 25 mg and Remeron 7.5 mg both for depression, insomnia, and poor appetite.  Patient managed to attend a few groups, however she did not see the benefit from those groups.  At present she continues to endorse passive suicidal ideations, and worsening depressive symptoms.  She is able to contract for safety while on the unit.   Patient denies any mania, paranoia, psychosis, hallucinations, otherwise psychiatric psychosis.  She denies any previous psychiatric history of depression, anxiety, suicide attempts, or self-harm behavior.  She denies any substance abuse and or legal charges.  She reports a family history of depression in her sister, and a daughter with a diagnosis of depression and anxiety.  She denies any homicidal ideations and is able to contract for safety.  Patient currently has a plan to discharge home with her parents, and initiate outpatient psychiatric behavioral health services.  At this time I do not foresee any barriers or dilemmas with the  exception of establishing care in a new state under her current insurance provider.  HPI:  50 year old female significant history of breast cancer currently undergoing chemotherapy and radiation brought here via EMS with complaint of for depression.  Patient states she is here because she feels very depressed with having suicidal thoughts but no specific plan.  She attributed to her depression stemming from active cancer diagnosis that she thought she previously have.  But then this cancer returns.  She also mention that she lives alone and she has no motivation to get up this morning to do anything.  She feels dehydrated because she does not have any appetite due to loss of taste and smell has been ongoing for several weeks. She denies homicidal ideation auditory or visual hallucination.  She is sleeping more.  She has never been formally diagnosed with depression.  Denies any active pain at this time.  Past Psychiatric History: See above  Risk to Self: Yes Risk to Others: Denies  Prior Inpatient Therapy: Denies  Prior Outpatient Therapy: Denies  Past Medical History:  Past Medical History:  Diagnosis Date   Arthritis    lower back   Cancer ([place])    right breast cancer   Family history of breast cancer    Personal history of chemotherapy    Personal history of radiation therapy     Past Surgical History:  Procedure Laterality Date   ABDOMINAL HYSTERECTOMY     APPLICATION OF CRANIAL NAVIGATION N/A 12/06/2020   Procedure: APPLICATION OF CRANIAL NAVIGATION;  Surgeon: Judith Part, MD;  Location: [place];  Service: Neurosurgery;  Laterality: N/A;  RM 20   BREAST LUMPECTOMY Right  BREAST LUMPECTOMY WITH RADIOACTIVE SEED AND SENTINEL LYMPH NODE BIOPSY Right 02/16/2018   Procedure: RIGHT BREAST LUMPECTOMY WITH RADIOACTIVE SEED AND SENTINEL LYMPH NODE BIOPSY;  Surgeon: [name], MD;  Location: [place];  Service: General;  Laterality: Right;   CRANIOTOMY Right  12/06/2020   Procedure: Right sided awake craniotomy for tumor resection;  Surgeon: Judith Part, MD;  Location: [place];  Service: Neurosurgery;  Laterality: Right;   HYMENECTOMY     IR IMAGING GUIDED PORT INSERTION  03/12/2021   IR US GUIDE BX ASP/DRAIN  12/02/2020   KYPHOPLASTY N/A 03/07/2021   Procedure: Thoracic nine, Thoracic ten KYPHOPLASTY;  Surgeon: Judith Part, MD;  Location: [place];  Service: Neurosurgery;  Laterality: N/A;   PORTACATH PLACEMENT Right 02/16/2018   Procedure: INSERTION PORT-A-CATH;  Surgeon: [name], MD;  Location: [place];  Service: General;  Laterality: Right;   RE-EXCISION OF BREAST LUMPECTOMY Right 02/22/2018   Procedure: RE-EXCISION OF RIGHT  BREAST LUMPECTOMY;  Surgeon: [name], MD;  Location: [place];  Service: General;  Laterality: Right;   REPAIR VAGINAL CUFF N/A 02/07/2017   Procedure: REPAIR VAGINAL CUFF;  Surgeon: Ena Dawley, MD;  Location: [facility];  Service: Gynecology;  Laterality: N/A;   ROBOTIC ASSISTED TOTAL HYSTERECTOMY WITH SALPINGECTOMY Left 01/20/2017   Procedure: ROBOTIC ASSISTED TOTAL HYSTERECTOMY WITH SALPINGECTOMY;  Surgeon: [name], MD;  Location: [facility];  Service: Gynecology;  Laterality: Left;   TOTAL HIP ARTHROPLASTY Right 2022   total hip and partial femur, per pt   Family History:  Family History  Problem Relation Age of Onset   Lung cancer Maternal Grandfather    Esophageal cancer Paternal Grandfather 9   Breast cancer Cousin 30   Family Psychiatric  History: Sister diagnosed with depression, daughter diagnosed with depression and anxiety Social History:  Social History   Substance and Sexual Activity  Alcohol Use Not Currently     Social History   Substance and Sexual Activity  Drug Use No    Social History   Socioeconomic History   Marital status: Legally Separated    Spouse name: Not on file   Number of children: Not on file   Years of  education: Not on file   Highest education level: Not on file  Occupational History   Not on file  Tobacco Use   Smoking status: Never   Smokeless tobacco: Never  Vaping Use   Vaping Use: Former  Substance and Sexual Activity   Alcohol use: Not Currently   Drug use: No   Sexual activity: Not Currently    Birth control/protection: Surgical  Other Topics Concern   Not on file  Social History Narrative   Not on file   Social Determinants of Health   Financial Resource Strain: Not on file  Food Insecurity: Not on file  Transportation Needs: Not on file  Physical Activity: Not on file  Stress: Not on file  Social Connections: Not on file   Additional Social History:    Allergies:  No Known Allergies  Labs:  Results for orders placed or performed during the hospital encounter of 05/05/21 (from the past 48 hour(s))  MRSA Next Gen by PCR, Nasal     Status: None   Collection Time: 05/05/21  4:14 PM   Specimen: Nasal Mucosa; Nasal Swab  Result Value Ref Range   MRSA by PCR Next Gen NOT DETECTED NOT DETECTED    Comment: (NOTE) The GeneXpert MRSA Assay (  FDA approved for NASAL specimens only), is one component of a comprehensive MRSA colonization surveillance program. It is not intended to diagnose MRSA infection nor to guide or monitor treatment for MRSA infections. Test performance is not FDA approved in patients less than 59 years old. Performed at [facility], [place] [address], [place], [place] [address]   CBC WITH DIFFERENTIAL     Status: Abnormal   Collection Time: 05/06/21  5:30 AM  Result Value Ref Range   WBC 1.6 (L) 4.0 - 10.5 K/uL   RBC 3.05 (L) 3.87 - 5.11 MIL/uL   Hemoglobin 10.1 (L) 12.0 - 15.0 g/dL   HCT 30.6 (L) 36.0 - 46.0 %   MCV 100.3 (H) 80.0 - 100.0 fL   MCH 33.1 26.0 - 34.0 pg   MCHC 33.0 30.0 - 36.0 g/dL   RDW 22.4 (H) 11.5 - 15.5 %   Platelets 145 (L) 150 - 400 K/uL   nRBC 1.3 (H) 0.0 - 0.2 %   Neutrophils Relative % 75 %    Neutro Abs 1.3 (L) 1.7 - 7.7 K/uL   Band Neutrophils 4 %   Lymphocytes Relative 9 %   Lymphs Abs 0.1 (L) 0.7 - 4.0 K/uL   Monocytes Relative 4 %   Monocytes Absolute 0.1 0.1 - 1.0 K/uL   Eosinophils Relative 2 %   Eosinophils Absolute 0.0 0.0 - 0.5 K/uL   Basophils Relative 0 %   Basophils Absolute 0.0 0.0 - 0.1 K/uL   WBC Morphology DOHLE BODIES     Comment: MODERATE LEFT SHIFT (>5% METAS AND MYELOS,OCC PRO NOTED) TOXIC GRANULATION    Myelocytes 6 %   Abs Immature Granulocytes 0.10 (H) 0.00 - 0.07 K/uL   Polychromasia PRESENT    Ovalocytes PRESENT     Comment: Performed at [facility], [place] [address], [place], [place] [address]  Comprehensive metabolic panel     Status: Abnormal   Collection Time: 05/06/21  5:30 AM  Result Value Ref Range   Sodium 136 135 - 145 mmol/L   Potassium 3.0 (L) 3.5 - 5.1 mmol/L   Chloride 105 98 - 111 mmol/L   CO2 24 22 - 32 mmol/L   Glucose, Bld 121 (H) 70 - 99 mg/dL    Comment: Glucose reference range applies only to samples taken after fasting for at least 8 hours.   BUN 13 6 - 20 mg/dL   Creatinine, Ser 0.53 0.44 - 1.00 mg/dL   Calcium 6.3 (LL) 8.9 - 10.3 mg/dL    Comment: CRITICAL RESULT CALLED TO, READ BACK BY AND VERIFIED WITH: ALLEN,P. RN AT 3299 05/06/21 MULLINS,T    Total Protein 5.4 (L) 6.5 - 8.1 g/dL   Albumin 2.5 (L) 3.5 - 5.0 g/dL   AST 21 15 - 41 U/L   ALT 26 0 - 44 U/L   Alkaline Phosphatase 125 38 - 126 U/L   Total Bilirubin 1.3 (H) 0.3 - 1.2 mg/dL   GFR, Estimated >60 >60 mL/min    Comment: (NOTE) Calculated using the CKD-EPI Creatinine Equation (2021)    Anion gap 7 5 - 15    Comment: Performed at [facility], [place] [address], [place], [place] [address]  C Difficile Quick Screen w PCR reflex     Status: None   Collection Time: 05/06/21 10:10 AM   Specimen: STOOL  Result Value Ref Range   C Diff antigen NEGATIVE NEGATIVE   C Diff toxin NEGATIVE NEGATIVE   C  Diff interpretation No  C. difficile detected.     Comment: Performed at [facility], [place] [address] [place], [place] [address]    Current Facility-Administered Medications  Medication Dose Route Frequency Provider Last Rate Last Admin   0.9 % NaCl with KCl 20 mEq/ L  infusion   Intravenous Continuous Magrinat, [name], MD 75 mL/hr at 05/06/21 1205 New Bag at 05/06/21 1205   acetaminophen (TYLENOL) tablet 650 mg  650 mg Oral Q6H PRN Reubin Milan, MD   650 mg at 05/06/21 0544   Or   acetaminophen (TYLENOL) suppository 650 mg  650 mg Rectal Q6H PRN Reubin Milan, MD       ceFEPIme (MAXIPIME) 2 g in sodium chloride 0.9 % 100 mL IVPB  2 g Intravenous Q8H Reubin Milan, MD 200 mL/hr at 05/06/21 0515 2 g at 05/06/21 0515   Chlorhexidine Gluconate Cloth 2 % PADS 6 each  6 each Topical Daily Richarda Osmond, MD   6 each at 05/06/21 1014   enoxaparin (LOVENOX) injection 40 mg  40 mg Subcutaneous Q24H Reubin Milan, MD   40 mg at 05/05/21 1706   feeding supplement (ENSURE ENLIVE / ENSURE PLUS) liquid 237 mL  237 mL Oral TID BM Richarda Osmond, MD       fluconazole (DIFLUCAN) tablet 100 mg  100 mg Oral Daily Reubin Milan, MD   100 mg at 05/06/21 7939   gabapentin (NEURONTIN) capsule 300 mg  300 mg Oral TID Reubin Milan, MD   300 mg at 05/06/21 0824   guaiFENesin-dextromethorphan (ROBITUSSIN DM) 100-10 MG/5ML syrup 5 mL  5 mL Oral Q4H PRN Reubin Milan, MD   5 mL at 05/06/21 0242   ibuprofen (ADVIL) 100 MG/5ML suspension 200 mg  200 mg Oral Q6H PRN Kathryne Eriksson, NP   200 mg at 05/06/21 0007   mirtazapine (REMERON) tablet 7.5 mg  7.5 mg Oral QHS Reubin Milan, MD   7.5 mg at 05/05/21 2120   multivitamin with minerals tablet 1 tablet  1 tablet Oral Daily Richarda Osmond, MD       nystatin (MYCOSTATIN) 100000 UNIT/ML suspension 500,000 Units  5 mL Oral QID Reubin Milan, MD   500,000 Units at 05/06/21 0823   ondansetron [facility]) injection 4  mg  4 mg Intravenous TID AC & HS Magrinat, [name], MD   4 mg at 05/06/21 0300   Or   ondansetron (ZOFRAN) tablet 4 mg  4 mg Oral TID AC & HS Magrinat, [name], MD   4 mg at 05/06/21 1204   pantoprazole (PROTONIX) EC tablet 40 mg  40 mg Oral Daily Reubin Milan, MD   40 mg at 05/06/21 9233   potassium chloride SA (KLOR-CON) CR tablet 40 mEq  40 mEq Oral BID Richarda Osmond, MD       sertraline (ZOLOFT) tablet 25 mg  25 mg Oral Daily Reubin Milan, MD   25 mg at 05/06/21 0076   tucatinib (TUKYSA) tablet 150 mg  150 mg Oral BID Magrinat, [name], MD   150 mg at 05/06/21 1013   valACYclovir (VALTREX) tablet 1,000 mg  1,000 mg Oral Daily Reubin Milan, MD   1,000 mg at 05/06/21 1014   Facility-Administered Medications Ordered in Other Encounters  Medication Dose Route Frequency Provider Last Rate Last Admin   sodium chloride flush (NS) 0.9 % injection 10 mL  10 mL Intracatheter  Once Magrinat, [name], MD        Musculoskeletal: Strength & Muscle Tone: within normal limits Gait & Station: normal Patient leans: N/A            Psychiatric Specialty Exam:  Presentation  General Appearance: Appropriate for Environment; Casual  Eye Contact:Fair  Speech:Clear and Coherent; Normal Rate  Speech Volume:Normal  Handedness:Right   Mood and Affect  Mood:Euthymic  Affect:Appropriate; Congruent   Thought Process  Thought Processes:Coherent; Linear  Descriptions of Associations:Intact  Orientation:Full (Time, Place and Person)  Thought Content:Logical  History of Schizophrenia/Schizoaffective disorder:No data recorded Duration of Psychotic Symptoms:No data recorded Hallucinations:Hallucinations: None  Ideas of Reference:None  Suicidal Thoughts:Suicidal Thoughts: Yes, Passive SI Passive Intent and/or Plan: With Intent; With Plan; With Means to [place]; With Access to Means  Homicidal Thoughts:Homicidal Thoughts: No   Sensorium   Memory:Immediate Fair; Recent Good; Remote Good  Judgment:Fair  Insight:Fair   Executive Functions  Concentration:Fair  Attention Span:Fair  [place]   Psychomotor Activity  Psychomotor Activity:Psychomotor Activity: Normal   Assets  Assets:Communication Skills; Financial Resources/Insurance; Desire for Improvement; Transportation; Physical Health; Resilience; Housing; Leisure Time; Social Support   Sleep  Sleep:Sleep: Fair  Physical Exam: Physical Exam Vitals and nursing note reviewed.  Constitutional:      Appearance: Normal appearance. She is normal weight.  HENT:     Head: Normocephalic.  Eyes:     Pupils: Pupils are equal, round, and reactive to light.  Skin:    General: Skin is warm and dry.  Neurological:     General: No focal deficit present.     Mental Status: She is alert and oriented to person, place, and time. Mental status is at baseline.  Psychiatric:        Attention and Perception: Attention and perception normal.        Mood and Affect: Mood is depressed. Affect is flat.        Speech: Speech normal.        Behavior: Behavior normal. Behavior is cooperative.        Thought Content: Thought content includes suicidal ideation.        Cognition and Memory: Cognition and memory normal.        Judgment: Judgment is impulsive.   Review of Systems  Psychiatric/Behavioral:  Positive for depression and suicidal ideas.   All other systems reviewed and are negative. Blood pressure 111/74, pulse (!) 102, temperature 99.4 F (37.4 C), temperature source Oral, resp. rate 16, height 5\' 8"  (1.727 m), weight 66.7 kg, last menstrual period 12/21/2016, SpO2 96 %. Body mass index is 22.35 kg/m.  Treatment Plan Summary: Plan   MDD severe: Patient presents with worsening depressive symptoms to include anhedonia, recurrent thoughts of death, hopelessness,worthlessness, suicidal thoughts and ideations with a plan to  overdose on meds. She reports no improvement since her brief 3 day stay at [facility].  In which she was started on Remeron and Zoloft, discussed GI side effects from both medications are to be expected with transient.  For about 4 to 5 days.  Reviewed course of expectation to include antidepressants taken approximately 4 to 6 weeks before therapeutic effect is seen in which patient verbalizes.    -While reviewing plan with patient she advised that her parents are flying in tomorrow and her current plan is to discharge home with them once released from the hospital.  We reviewed likelihood of discharge home with parents, follow-up with outpatient psych,  potentially partial hospitalization programming.  Patient does states she is able to contract for safety, and feels safe going home with her parents where they will be able to help manage her care, medication management, and increase her overall morale. -Psychiatry will continue to follow until patient is medically stable for discharge.  It is likely patient will not need to go back to inpatient psychiatric admission if she is cleared. -We will begin working closely with [facility] for outpatient psychiatric resources, once parents have presented to the hospital.  Will likely need location to establish closer proximity for outpatient behavioral health services to include partial hospitalization programming.    Disposition: No evidence of imminent risk to self or others at present.   Supportive therapy provided about ongoing stressors. Discussed crisis plan, support from social network, calling 911, coming to the Emergency Department, and calling Suicide Hotline. Refer to PHP.   Suella Broad, FNP 05/06/2021 2:21 PM

## 2021-05-06 NOTE — Progress Notes (Signed)
Monica [name]   DOB:03/04/1971   VP#:[id number]   NIO#:[id number]  Subjective:  Monica [name] tells me she did not sleep last night. She had a mostly liquid BM this AM. Otherwise ROS stable c/w yesterday.  Objective:  Vitals:   05/06/21 0513 05/06/21 0646  BP:  111/74  Pulse:  (!) 102  Resp:  16  Temp: 99.5 F (37.5 C) 99.4 F (37.4 C)  SpO2:  96%    Body mass index is 22.35 kg/m.  Intake/Output Summary (Last 24 hours) at 05/06/2021 0755 Last data filed at 05/06/2021 0125 Gross per 24 hour  Intake 1188.65 ml  Output --  Net 1188.65 ml     Sclerae unicteric  Lungs no rales or wheezes--auscultated anterolaterally  Heart regular rate and rhythm  Abdomen soft, +BS  Neuro nonfocal   CBG (last 3)  No results for input(s): GLUCAP in the last 72 hours.   Labs:  Lab Results  Component Value Date   WBC 1.6 (L) 05/06/2021   HGB 10.1 (L) 05/06/2021   HCT 30.6 (L) 05/06/2021   MCV 100.3 (H) 05/06/2021   PLT 145 (L) 05/06/2021   NEUTROABS 1.3 (L) 05/06/2021    '@LASTCHEMISTRY' @  Urine Studies No results for input(s): UHGB, CRYS in the last 72 hours.  Invalid input(s): UACOL, UAPR, USPG, UPH, UTP, UGL, [place], UBIL, UNIT, UROB, [place], UEPI, UWBC, Junie Panning [place], [place], [place]  Basic Metabolic Panel: Recent Labs  Lab 05/01/21 1116 05/02/21 1848 05/03/21 2223 05/05/21 0658 05/05/21 1150 05/06/21 0530  NA 132* 135 134* 133*  --  136  K 3.5 3.4* 3.0* 3.1*  --  3.0*  CL 102 106 103 101  --  105  CO2 21* 20* 22 26  --  24  GLUCOSE 96 150* 130* 155*  --  121*  BUN '18 20 11 7  ' --  13  CREATININE 0.56 0.66 0.52 0.45  --  0.53  CALCIUM 7.6* 7.5* 7.3* 7.0*  --  6.3*  MG 2.1  --   --   --  1.9  --   PHOS 2.3*  --   --   --   --   --    GFR Estimated Creatinine Clearance: 84.9 mL/min (by C-G formula based on SCr of 0.53 mg/dL). Liver Function Tests: Recent Labs  Lab 05/01/21 1116 05/02/21 1848 05/06/21 0530  AST 34 31 21  ALT 53* 49* 26  ALKPHOS 123 141* 125  BILITOT 1.0 1.0  1.3*  PROT 6.0* 6.4* 5.4*  ALBUMIN 3.3* 3.6 2.5*   No results for input(s): LIPASE, AMYLASE in the last 168 hours. No results for input(s): AMMONIA in the last 168 hours. Coagulation profile No results for input(s): INR, PROTIME in the last 168 hours.  CBC: Recent Labs  Lab 05/01/21 1116 05/03/21 0636 05/03/21 0643 05/03/21 2223 05/05/21 0658 05/06/21 0530  WBC 2.4*  --  1.3* 1.3* 0.8* 1.6*  NEUTROABS 2.0 0.9*  --   --  0.3* 1.3*  HGB 11.2*  --  11.3* 10.3* 10.2* 10.1*  HCT 34.0*  --  35.7* 31.5* 31.1* 30.6*  MCV 100.6*  --  103.8* 101.6* 103.3* 100.3*  PLT 151  --  142* 130* 131* 145*   Cardiac Enzymes: No results for input(s): CKTOTAL, CKMB, CKMBINDEX, TROPONINI in the last 168 hours. BNP: Invalid input(s): POCBNP CBG: No results for input(s): GLUCAP in the last 168 hours. D-Dimer No results for input(s): DDIMER in the last 72 hours. Hgb A1c No results for input(s): HGBA1C  in the last 72 hours. Lipid Profile No results for input(s): CHOL, HDL, LDLCALC, TRIG, CHOLHDL, LDLDIRECT in the last 72 hours. Thyroid function studies No results for input(s): TSH, T4TOTAL, T3FREE, THYROIDAB in the last 72 hours.  Invalid input(s): FREET3 Anemia work up No results for input(s): VITAMINB12, FOLATE, FERRITIN, TIBC, IRON, RETICCTPCT in the last 72 hours. Microbiology Recent Results (from the past 240 hour(s))  Resp Panel by RT-PCR (Flu A&B, Covid) Nasopharyngeal Swab     Status: None   Collection Time: 05/01/21 10:39 AM   Specimen: Nasopharyngeal Swab; Nasopharyngeal(NP) swabs in vial transport medium  Result Value Ref Range Status   SARS Coronavirus 2 by RT PCR NEGATIVE NEGATIVE Final    Comment: (NOTE) SARS-CoV-2 target nucleic acids are NOT DETECTED.  The SARS-CoV-2 RNA is generally detectable in upper respiratory specimens during the acute phase of infection. The lowest concentration of SARS-CoV-2 viral copies this assay can detect is 138 copies/mL. A negative result  does not preclude SARS-Cov-2 infection and should not be used as the sole basis for treatment or other patient management decisions. A negative result may occur with  improper specimen collection/handling, submission of specimen other than nasopharyngeal swab, presence of viral mutation(s) within the areas targeted by this assay, and inadequate number of viral copies(<138 copies/mL). A negative result must be combined with clinical observations, patient history, and epidemiological information. The expected result is Negative.  Fact Sheet for Patients:  EntrepreneurPulse.com.au  Fact Sheet for Healthcare Providers:  IncredibleEmployment.be  This test is no t yet approved or cleared by the [place] FDA and  has been authorized for detection and/or diagnosis of SARS-CoV-2 by FDA under an Emergency Use Authorization (EUA). This EUA will remain  in effect (meaning this test can be used) for the duration of the COVID-19 declaration under Section 564(b)(1) of the Act, 21 U.S.C.section 360bbb-3(b)(1), unless the authorization is terminated  or revoked sooner.       Influenza A by PCR NEGATIVE NEGATIVE Final   Influenza B by PCR NEGATIVE NEGATIVE Final    Comment: (NOTE) The Xpert Xpress SARS-CoV-2/FLU/RSV plus assay is intended as an aid in the diagnosis of influenza from Nasopharyngeal swab specimens and should not be used as a sole basis for treatment. Nasal washings and aspirates are unacceptable for Xpert Xpress SARS-CoV-2/FLU/RSV testing.  Fact Sheet for Patients: EntrepreneurPulse.com.au  Fact Sheet for Healthcare Providers: IncredibleEmployment.be  This test is not yet approved or cleared by the [place] FDA and has been authorized for detection and/or diagnosis of SARS-CoV-2 by FDA under an Emergency Use Authorization (EUA). This EUA will remain in effect (meaning this test can be used) for  the duration of the COVID-19 declaration under Section 564(b)(1) of the Act, 21 U.S.C. section 360bbb-3(b)(1), unless the authorization is terminated or revoked.  Performed at [facility], [place] [address] [place], [place] [address]   Resp Panel by RT-PCR (Flu A&B, Covid) Nasopharyngeal Swab     Status: None   Collection Time: 05/05/21  1:46 PM   Specimen: Nasopharyngeal Swab; Nasopharyngeal(NP) swabs in vial transport medium  Result Value Ref Range Status   SARS Coronavirus 2 by RT PCR NEGATIVE NEGATIVE Final    Comment: (NOTE) SARS-CoV-2 target nucleic acids are NOT DETECTED.  The SARS-CoV-2 RNA is generally detectable in upper respiratory specimens during the acute phase of infection. The lowest concentration of SARS-CoV-2 viral copies this assay can detect is 138 copies/mL. A negative result does not preclude SARS-Cov-2 infection and should not  be used as the sole basis for treatment or other patient management decisions. A negative result may occur with  improper specimen collection/handling, submission of specimen other than nasopharyngeal swab, presence of viral mutation(s) within the areas targeted by this assay, and inadequate number of viral copies(<138 copies/mL). A negative result must be combined with clinical observations, patient history, and epidemiological information. The expected result is Negative.  Fact Sheet for Patients:  EntrepreneurPulse.com.au  Fact Sheet for Healthcare Providers:  IncredibleEmployment.be  This test is no t yet approved or cleared by the [place] FDA and  has been authorized for detection and/or diagnosis of SARS-CoV-2 by FDA under an Emergency Use Authorization (EUA). This EUA will remain  in effect (meaning this test can be used) for the duration of the COVID-19 declaration under Section 564(b)(1) of the Act, 21 U.S.C.section 360bbb-3(b)(1), unless the authorization is  terminated  or revoked sooner.       Influenza A by PCR NEGATIVE NEGATIVE Final   Influenza B by PCR NEGATIVE NEGATIVE Final    Comment: (NOTE) The Xpert Xpress SARS-CoV-2/FLU/RSV plus assay is intended as an aid in the diagnosis of influenza from Nasopharyngeal swab specimens and should not be used as a sole basis for treatment. Nasal washings and aspirates are unacceptable for Xpert Xpress SARS-CoV-2/FLU/RSV testing.  Fact Sheet for Patients: EntrepreneurPulse.com.au  Fact Sheet for Healthcare Providers: IncredibleEmployment.be  This test is not yet approved or cleared by the [place] FDA and has been authorized for detection and/or diagnosis of SARS-CoV-2 by FDA under an Emergency Use Authorization (EUA). This EUA will remain in effect (meaning this test can be used) for the duration of the COVID-19 declaration under Section 564(b)(1) of the Act, 21 U.S.C. section 360bbb-3(b)(1), unless the authorization is terminated or revoked.  Performed at [facility], [place] [address], [place], [place] [address]   MRSA Next Gen by PCR, Nasal     Status: None   Collection Time: 05/05/21  4:14 PM   Specimen: Nasal Mucosa; Nasal Swab  Result Value Ref Range Status   MRSA by PCR Next Gen NOT DETECTED NOT DETECTED Final    Comment: (NOTE) The GeneXpert MRSA Assay (FDA approved for NASAL specimens only), is one component of a comprehensive MRSA colonization surveillance program. It is not intended to diagnose MRSA infection nor to guide or monitor treatment for MRSA infections. Test performance is not FDA approved in patients less than 50 years old. Performed at [facility], [place] [address] [place], [place] [address]       Studies:  No results found.  Assessment: 50 y.o.  Monica [name], Monica [name] status post right breast upper inner quadrant biopsy 12/29/2017, for a clinical T1c N0, stage IA invasive ductal  carcinoma, triple positive, with an MIB-1 of 80%.   (1) genetics testing 02/02/2018 though the CancerNext gene panel offered by [facility] showed no deleterious mutations in  APC, ATM, BARD1, BMPR1A,BRCA1, BRCA2, BRIP1, CDH1, CDK4, CDKN2A, CHEK2, DICER1, HOXB13, MLH1, MRE11A, MSH2, MSH6, MUTYH, NBN, NF1, PALB2, PMS2, POLD1, POLE, PTEN, RAD50, RAD51C, RAD51D, SMAD4, SMARCA4, STK11 and TP53 (sequencing and deletion/duplication); EPCAM and GREM1 (deletion/duplication only).             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>A) )    (2) right lumpectomy and sentinel lymph node sampling 02/16/2018 showed a pT2 pN0, stage IB invasive ductal carcinoma, grade 3, with a positive inferior margin; a total of 5 lymph nodes  were removed             (a) additional surgery 02/27/2018 cleared the margins   (3) started carboplatin, docetaxel, trastuzumab and pertuzumab 03/11/2018, repeated every 21 days x 6, last dose 07/18/2018.               (a) Pertuzumab omitted after cycle 1 due to diarrhea.             (b) Gemcitabine substituted for docetaxel starting with cycle 5 due to peripheral neuropathy   (4) continued trastuzumab to total 6 months (through February 2020).             (a) echocardiogram 01/21/2018 showed an ejection fraction in the 55-60% range             (b) repeat echocardiogram 06/14/2018 showed an ejection fraction in the 60-65%   (5) adjuvant radiation 08/15/2018 - 09/28/2018  Site/dose:   The patient initially received a dose of 50.4 Gy in 28 fractions to the right breast using whole-breast tangent fields. This was delivered using a 3-D conformal technique. The patient then received a boost to the seroma. This delivered an additional 10 Gy in 5 fractions using 12E, 9E electrons with a special teletherapy technique. The total dose was 60.4 Gy.    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, involving brain, liver, lungs, and bones (7) presenting 11/29/2020 with progressive back pain:             (a) non-contrast CT abd/pelvis (renal stone study) 11/29/2020 shows bone metastases             (b) CT chest/abd/pelvis with contrast 11/30/2020 shows multiple large liver masses, multiple pulmonary nodules, and widespread lytic bone lesions             (c) MRI brain shows at least 4 brain lesions, largest 3.8 cm             (d) total spinal MRI 11/30/2020 shows pathologic fractures at T9, T12, possibly L2, as well as numerous other spine lesions, but no epidural enhancement or cord compression             (e) liver biopsy 12/02/2020 shows adenocarcinoma, prognostic panel again triple positive             (f) pre-op repeat MRI 12/01/2020 shows additional brain lesions, at least 8 of which will be targets for SRS   (8) CNS treatment:             (a) [place] 12/05/2020             (b) surgery 12/06/2020 confirmed metastatic carcinoma, estrogen receptor positive, HER2 amplified, progesterone receptor negative, with an MIB-1 of 70%.             (c) brain MRI 03/06/2021 shows minimal enhancement along the resection margin, previously noted lesions stable or decreased in size, some no longer seen, and no new brain lesions   (9) XRT to spine 12/05/2020 through 12/23/2020 Site Technique Total Dose (Gy) Dose per Fx (Gy) Completed Fx Beam Energies  Thoracic Spine: Spine_T9-L3 Complex 30/30 3  10/10 15X  Pelvis: Pelvis_sacrum Complex 30/30 3 10/10 15X    (10) advanced directives-- plans to name son as HCPOA with her father as secondary   (81) started capecitabine 01/08/2021, trastuzumab 01/20/2021, tucatinib 02/10/2021             (a) echo 01/14/2021 shows an ejection fraction in the 60-65% range.             (b) capecitabine and  trastuzumab discontinued 04/15/2021 with evidence of progression             (c) tucatinib continued   (12) started denosumab/Xgeva 02/10/2021, repeated every 6 weeks             (a) received one dose pamidronate in hospital 01/16/2021   (13) pain management:             (a) Tylenol 500 mg and Advil 400 mg 3 times a day w as needed             (b) gabapentin 3 times a day as needed             (c) tramadol 50 mg 4 times a day as needed              (d) bowel prophylaxis: MiraLAX daily, stool softeners 2 tablets twice daily as needed   (14) status post T9 and T10 kyphoplasty 03/07/2021   (15) Enhertu started 04/23/2021  (16) febrile neutropenia 05/05/2021   Plan:  Monica [name] is now day 14 cycle 1 Enhertu. Her counts are recovering with ANC today at 1.3, She was still febrile last night.  Apparently stool not yet collected for C.Diff. Discussed with nursing.  Tucatinib has dropped out of med list-- rewrote.  Monica [name]'s parents are due to arrive tomorrow 10/26. The plan is for her to go with them to [facility] when discharged. We will need (I assume) a note from psychiatry releasing her to her parents' care.  She was due for repeat brain MRI and I have written for that to be done tomorrow  Hopefully PTx can assess-- we can write for her to have HHPTx after discharge here or in [place].  Will follow with you  Chauncey Cruel, MD 05/06/2021  7:55 AM Medical Oncology and Hematology [facility] [address] [place], [place] [address] Tel. [phone]    Fax. [phone]

## 2021-05-06 NOTE — Progress Notes (Signed)
PROGRESS NOTE  Monica [name]    DOB: Nov 05, 1970, 50 y.o.  IWP:[id number]  PCP: Beverley Fiedler, FNP   Code Status: Full Code   DOA: 05/05/2021   LOS: 1  Brief Narrative of Current Hospitalization  Monica [name] is a 50 y.o. female with a PMH significant for osteoarthritis, depression, metastatic breast cancer, recent behavioral health admission for suicidal ideations. They presented from behavioral health hospital to the ED on 05/05/2021 with abdominal pain, nausea, vomiting x 1 days. In the ED, it was found that they had a fever with low white blood cell count. They were treated with IV antibiotics, IV fluids, Tylenol.  Heme/onc was consulted.  Patient was admitted to medicine service for further workup and management of neutropenic fever as outlined in detail below.  05/06/21 -stable  Assessment & Plan  Principal Problem:   Febrile neutropenia (HCC) Active Problems:   Malignant neoplasm of upper-inner quadrant of right breast in female, estrogen receptor positive (HCC)   Hypokalemia   Suicidal ideation   Major depressive disorder   Diarrhea  Febrile neutropenia-in setting of malignancy. improved.  Patient heart rate has decreased to 102 and temperature 99.4 this morning.  WBCs improved to 1.6 today - heme onc following, appreciate recommendations -Continue cefepime -As needed antiemetics -Follow-up blood cultures and de-escalate antibiotics as able -Follow stool studies including C. difficile toxin  Major depressive order-denies SI -Continue home behavioral health medications including Remeron, sertraline -Continue one-to-one observation -Psychiatry has been consulted by admitting provider  H/o Right breast invasive ductal carcinoma stage Ia with metastatic disease involving brain, liver, lungs and bones beginning May 2022 on current chemotherapy treatments.  -Management per heme-onc -Brain MRI 10/26 -PT/OT evaluation  DVT prophylaxis: enoxaparin (LOVENOX) injection  40 mg Start: 05/05/21 1800  Diet:  Diet Orders (From admission, onward)     Start     Ordered   05/05/21 1534  Diet Heart Room service appropriate? Yes; Fluid consistency: Thin  Diet effective now       Question Answer Comment  Room service appropriate? Yes   Fluid consistency: Thin      05/05/21 1533            Subjective 05/06/21    Pt reports that she has had no improvement since admission.  But upon further questioning she states that her nausea and vomiting and abdominal pain have all resolved.  She had a loose bowel movement this morning.  She has no further concerns or questions at this time.  Disposition Plan & Communication  Status is: Inpatient  Remains inpatient appropriate because: ongoing workup     Inpatient To Be Determined  Family Communication: none  Consults, Procedures, Significant Events  Consultants:  Heme/onc psychiatry  Procedures/significant events:  Brain MRI 10/26  Antimicrobials:  Anti-infectives (From admission, onward)    Start     Dose/Rate Route Frequency Ordered Stop   05/06/21 1000  valACYclovir (VALTREX) tablet 1,000 mg       Note to Pharmacy: Take one tablet twice a day for one week, then daily     1,000 mg Oral Daily 05/05/21 1633     05/06/21 1000  fluconazole (DIFLUCAN) tablet 100 mg        100 mg Oral Daily 05/05/21 1633     05/05/21 2000  ceFEPIme (MAXIPIME) 2 g in sodium chloride 0.9 % 100 mL IVPB        2 g 200 mL/hr over 30 Minutes Intravenous Every 8 hours 05/05/21 1533  Objective   Vitals:   05/06/21 0244 05/06/21 0248 05/06/21 0513 05/06/21 0646  BP: 104/72   111/74  Pulse: (!) 105   (!) 102  Resp: 16   16  Temp: (!) 101.3 F (38.5 C)  99.5 F (37.5 C) 99.4 F (37.4 C)  TempSrc: Oral  Oral Oral  SpO2: 93%   96%  Weight:  66.7 kg    Height:  5\' 8"  (1.727 m)      Intake/Output Summary (Last 24 hours) at 05/06/2021 0800 Last data filed at 05/06/2021 0125 Gross per 24 hour  Intake 1188.65 ml   Output --  Net 1188.65 ml   Filed Weights   05/06/21 0248  Weight: 66.7 kg    Patient BMI: Body mass index is 22.35 kg/m.   Physical Exam: General: awake, alert, NAD HEENT: atraumatic, clear conjunctiva, anicteric sclera, eyes watering, moist mucus membranes, hearing grossly normal Respiratory: normal respiratory effort. Cardiovascular: normal S1/S2,  RRR, no JVD, murmurs, rubs, gallops, quick capillary refill  Nervous: A&O x3. no gross focal neurologic deficits, normal speech Extremities: moves all equally, no edema, normal tone Skin: dry, intact, normal temperature, normal color, No rashes, lesions or ulcers Psychiatry: flat mood, affect  Labs   I have personally reviewed following labs and imaging studies Admission on 05/05/2021  Component Date Value Ref Range Status   MRSA by PCR Next Gen 05/05/2021 NOT DETECTED  NOT DETECTED Final   WBC 05/06/2021 1.6 (A)  4.0 - 10.5 K/uL Final   RBC 05/06/2021 3.05 (A)  3.87 - 5.11 MIL/uL Final   Hemoglobin 05/06/2021 10.1 (A)  12.0 - 15.0 g/dL Final   HCT 05/06/2021 30.6 (A)  36.0 - 46.0 % Final   MCV 05/06/2021 100.3 (A)  80.0 - 100.0 fL Final   MCH 05/06/2021 33.1  26.0 - 34.0 pg Final   MCHC 05/06/2021 33.0  30.0 - 36.0 g/dL Final   RDW 05/06/2021 22.4 (A)  11.5 - 15.5 % Final   Platelets 05/06/2021 145 (A)  150 - 400 K/uL Final   nRBC 05/06/2021 1.3 (A)  0.0 - 0.2 % Final   Neutrophils Relative % 05/06/2021 75  % Final   Neutro Abs 05/06/2021 1.3 (A)  1.7 - 7.7 K/uL Final   Band Neutrophils 05/06/2021 4  % Final   Lymphocytes Relative 05/06/2021 9  % Final   Lymphs Abs 05/06/2021 0.1 (A)  0.7 - 4.0 K/uL Final   Monocytes Relative 05/06/2021 4  % Final   Monocytes Absolute 05/06/2021 0.1  0.1 - 1.0 K/uL Final   Eosinophils Relative 05/06/2021 2  % Final   Eosinophils Absolute 05/06/2021 0.0  0.0 - 0.5 K/uL Final   Basophils Relative 05/06/2021 0  % Final   Basophils Absolute 05/06/2021 0.0  0.0 - 0.1 K/uL Final   WBC  Morphology 05/06/2021 DOHLE BODIES   Final   Myelocytes 05/06/2021 6  % Final   Abs Immature Granulocytes 05/06/2021 0.10 (A)  0.00 - 0.07 K/uL Final   Polychromasia 05/06/2021 PRESENT   Final   Ovalocytes 05/06/2021 PRESENT   Final   Sodium 05/06/2021 136  135 - 145 mmol/L Final   Potassium 05/06/2021 3.0 (A)  3.5 - 5.1 mmol/L Final   Chloride 05/06/2021 105  98 - 111 mmol/L Final   CO2 05/06/2021 24  22 - 32 mmol/L Final   Glucose, Bld 05/06/2021 121 (A)  70 - 99 mg/dL Final   BUN 05/06/2021 13  6 - 20 mg/dL Final  Creatinine, Ser 05/06/2021 0.53  0.44 - 1.00 mg/dL Final   Calcium 05/06/2021 6.3 (A)  8.9 - 10.3 mg/dL Final   Total Protein 05/06/2021 5.4 (A)  6.5 - 8.1 g/dL Final   Albumin 05/06/2021 2.5 (A)  3.5 - 5.0 g/dL Final   AST 05/06/2021 21  15 - 41 U/L Final   ALT 05/06/2021 26  0 - 44 U/L Final   Alkaline Phosphatase 05/06/2021 125  38 - 126 U/L Final   Total Bilirubin 05/06/2021 1.3 (A)  0.3 - 1.2 mg/dL Final   GFR, Estimated 05/06/2021 >60  >60 mL/min Final   Anion gap 05/06/2021 7  5 - 15 Final    Imaging Studies  No results found. Medications   Scheduled Meds:  Chlorhexidine Gluconate Cloth  6 each Topical Daily   enoxaparin (LOVENOX) injection  40 mg Subcutaneous Q24H   feeding supplement  237 mL Oral BID BM   fluconazole  100 mg Oral Daily   gabapentin  300 mg Oral TID   mirtazapine  7.5 mg Oral QHS   nystatin  5 mL Oral QID   ondansetron (ZOFRAN) IV  4 mg Intravenous TID AC & HS   Or   ondansetron  4 mg Oral TID AC & HS   pantoprazole  40 mg Oral Daily   sertraline  25 mg Oral Daily   valACYclovir  1,000 mg Oral Daily   No recently discontinued medications to reconcile  LOS: 1 day   Time spent: >89min  Monica Lague L Makaley Storts, DO [facility] 05/06/2021, 8:00 AM    Please refer to amion to contact the [facility] Attending or Consulting provider for this pt  [email or web address] Available by Epic secure chat 7AM-7PM. If 7PM-7AM, please contact  night-coverage

## 2021-05-06 NOTE — Progress Notes (Unsigned)
[place]  [phone] [phone]    ID: Monica [name] DOB: April 08, 1971  MR#: [id number]  JYN#:[id number]  Patient Care Team: Monica [name], Monica [name] as PCP - General (Endocrinology) Monica Luna, Monica [name] as Consulting Physician (General Surgery) Monica [name], Monica Dad, Monica [name] as Consulting Physician (Oncology) Monica Rudd, Monica [name] as Consulting Physician (Radiation Oncology) Monica Louis, Monica [name] as Consulting Physician (Obstetrics and Gynecology) Monica Dresser, Monica [name] as Consulting Physician (Cardiology) Monica Coffin, Monica [name] as Referring Physician (Orthopedic Surgery) Monica Part, Monica [name] as Consulting Physician (Neurosurgery) Monica [name], RPH-CPP (Pharmacist) Monica Sellers, Monica [name] as Consulting Physician (Psychiatry) Monica Part, Monica [name] as Consulting Physician (Neurosurgery) OTHER Monica [name]: Monica [name] Dermatology  CHIEF COMPLAINT: triple positive breast cancer  CURRENT TREATMENT: tucatinib, Enhertu; denosumab Xgeva   INTERVAL HISTORY: Monica [name] returns today for follow up and treatment of her triple positive breast cancer.    She continues on tucatinib, started 02/12/2021.  She is tolerating this with no side effects that she is aware to.  Most recent scans showed evidence of progression so she is being switched to Enhertu, with her first dose today.  She has a good understanding of the possible toxicities side effects and complications of this agent.  Her most recent echo was 705 2022 with an ejection fraction of 60-65%.   REVIEW OF SYSTEMS: Monica [name] tells me she is "fine" right now so long she takes her medications regularly.  She is using Tylenol and 2 Advil 3 times a day for pain and this is producing very good control.  She no longer has nausea so she is not taking Compazine or any other antiemetic.  She has fleeting headaches, which are not really pain she says and are again very very brief.  Unfortunately her vision is still poor and she has difficulty  concentrating.  She cannot read very well.  She does not find anything on TV to watch, which I can understand and she does not have access to audio books--she knows the excess but does not know how she might get them.  Bowel movements are fine.  Urine is a bit concentrated but otherwise unremarkable.  The biggest problem is that she is alone all day with nothing to do and this is really driving her insane she says   COVID 19 VACCINATION STATUS: Status post No Name x2, no booster as of December 2021   HISTORY OF CURRENT ILLNESS: From the original intake note:   Monica [name] (pronounced "uric") palpated a right supraclavicular mass and felt tenderness after stretching one day. She brought this to her PA's attention.  The patient underwent bilateral diagnostic mammography with tomography and right breast ultrasonography at [facility] on 12/20/2017 showing: breast density category C. There was a suspicious palpable mass at the 1 o'clock position upper inner quadrant measuring 1.4 x 1.3 x 1.3 cm located 12 cm from the nipple. There was an additional mass at the 8 o'clock radiant measuring 1.2 x 1.0 x 1.4 cm, which likely represented a cyst. Ultrasound evaluation of the right axilla demonstrates no evidence of lymphadenopathy   Accordingly on 12/29/2017 she proceeded to biopsy of the right breast area in question. The pathology from this procedure showed ([id number]): Invasive ductal carcinoma, grade III. Prognostic indicators significant for: estrogen receptor, 80% positive and progesterone receptor, 70% positive, both with strong staining intensity. Proliferation marker Ki67 at 80%. HER2 amplified with ratios HER2/CEP17 signals 3.44 and average HER2 copies per cell 10.65.   The patient's subsequent history is as  detailed below.   PAST MEDICAL HISTORY: Past Medical History:  Diagnosis Date   Arthritis    lower back   Cancer [name])    right breast cancer   Family history of breast cancer     Personal history of chemotherapy    Personal history of radiation therapy   She notes that she had a heart murmur as a child, which she grew out of.    PAST SURGICAL HISTORY: Past Surgical History:  Procedure Laterality Date   ABDOMINAL HYSTERECTOMY     APPLICATION OF CRANIAL NAVIGATION N/A 12/06/2020   Procedure: APPLICATION OF CRANIAL NAVIGATION;  Surgeon: Monica Part, Monica [name];  Location: [facility];  Service: Neurosurgery;  Laterality: N/A;  RM 20   BREAST LUMPECTOMY Right    BREAST LUMPECTOMY WITH RADIOACTIVE SEED AND SENTINEL LYMPH NODE BIOPSY Right 02/16/2018   Procedure: RIGHT BREAST LUMPECTOMY WITH RADIOACTIVE SEED AND SENTINEL LYMPH NODE BIOPSY;  Surgeon: Monica Luna, Monica [name];  Location: [facility];  Service: General;  Laterality: Right;   CRANIOTOMY Right 12/06/2020   Procedure: Right sided awake craniotomy for tumor resection;  Surgeon: Monica Part, Monica [name];  Location: [facility];  Service: Neurosurgery;  Laterality: Right;   HYMENECTOMY     IR IMAGING GUIDED PORT INSERTION  03/12/2021   IR US GUIDE BX ASP/DRAIN  12/02/2020   KYPHOPLASTY N/A 03/07/2021   Procedure: Thoracic nine, Thoracic ten KYPHOPLASTY;  Surgeon: Monica Part, Monica [name];  Location: [facility];  Service: Neurosurgery;  Laterality: N/A;   PORTACATH PLACEMENT Right 02/16/2018   Procedure: INSERTION PORT-A-CATH;  Surgeon: Monica Luna, Monica [name];  Location: [facility];  Service: General;  Laterality: Right;   RE-EXCISION OF BREAST LUMPECTOMY Right 02/22/2018   Procedure: RE-EXCISION OF RIGHT  BREAST LUMPECTOMY;  Surgeon: Monica Luna, Monica [name];  Location: [facility];  Service: General;  Laterality: Right;   REPAIR VAGINAL CUFF N/A 02/07/2017   Procedure: REPAIR VAGINAL CUFF;  Surgeon: Monica Dawley, Monica [name];  Location: [facility];  Service: Gynecology;  Laterality: N/A;   ROBOTIC ASSISTED TOTAL HYSTERECTOMY WITH SALPINGECTOMY Left 01/20/2017   Procedure: ROBOTIC ASSISTED TOTAL HYSTERECTOMY  WITH SALPINGECTOMY;  Surgeon: Monica Louis, Monica [name];  Location: [facility];  Service: Gynecology;  Laterality: Left;   TOTAL HIP ARTHROPLASTY Right 2022   total hip and partial femur, per pt  Partial Hysterectomy without BSO   FAMILY HISTORY Family History  Problem Relation Age of Onset   Lung cancer Maternal Grandfather    Esophageal cancer Paternal Grandfather 47   Breast cancer Cousin 43  As of July 2019, the patient's father is alive at 86. The patient's mother is also alive at 42. The patient has 1 brother and 5 sisters. There was a maternal grandfather diagnosed with lung cancer at 40. There was a paternal grandfather diagnosed with esophageal cancer at 30. The patient's father had a pre-cancerous esophageal finding at age 71. There was also a maternal 1st cousin diagnosed with metastatic breast cancer at age 81.    GYNECOLOGIC HISTORY:  Patient's last menstrual period was 12/21/2016 (exact date). Menarche: 50 years old Age at first live birth: 50 years old She is GXP2. She is status post partial hysterectomy without BSO in 2018 She never used HRT. She used oral contraception over 21 years ago with no complications.   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 The patient is separated from her husband, [name]. The patient's son [name] is in  the [employer] and is stationed in [place] (for 3 years beginning January 2020). He plans on working in the railroad industry after he returns. The patient's daughter [name], age 2, works at Engineer, maintenance    [place]: Not in place; at the 06/20/2020 visit the patient was given the appropriate documents to complete and notarized at her discretion   HEALTH MAINTENANCE: Social History   Tobacco Use   Smoking status: Never   Smokeless tobacco: Never  Vaping Use   Vaping Use: Former  Substance Use Topics   Alcohol use: Not Currently   Drug use: No      Colonoscopy:   PAP: 2018/ prior to hysterectomy  Bone density:   No Known Allergies  No current facility-administered medications for this visit.   No current outpatient medications on file.   Facility-Administered Medications Ordered in Other Visits  Medication Dose Route Frequency Provider Last Rate Last Admin   0.9 % NaCl with KCl 20 mEq/ L  infusion   Intravenous Continuous Raydan Schlabach, Monica Dad, Monica [name] 75 mL/hr at 05/06/21 1205 New Bag at 05/06/21 1205   acetaminophen (TYLENOL) tablet 650 mg  650 mg Oral Q6H PRN Reubin Milan, Monica [name]   650 mg at 05/06/21 0544   Or   acetaminophen (TYLENOL) suppository 650 mg  650 mg Rectal Q6H PRN Reubin Milan, Monica [name]       ceFEPIme (MAXIPIME) 2 g in sodium chloride 0.9 % 100 mL IVPB  2 g Intravenous Q8H Reubin Milan, Monica [name] 200 mL/hr at 05/06/21 0515 2 g at 05/06/21 0515   Chlorhexidine Gluconate Cloth 2 % PADS 6 each  6 each Topical Daily Richarda Osmond, Monica [name]   6 each at 05/06/21 1014   enoxaparin (LOVENOX) injection 40 mg  40 mg Subcutaneous Q24H Reubin Milan, Monica [name]   40 mg at 05/05/21 1706   feeding supplement (ENSURE ENLIVE / ENSURE PLUS) liquid 237 mL  237 mL Oral BID BM Reubin Milan, Monica [name]   237 mL at 05/06/21 1015   fluconazole (DIFLUCAN) tablet 100 mg  100 mg Oral Daily Reubin Milan, Monica [name]   100 mg at 05/06/21 3578   gabapentin (NEURONTIN) capsule 300 mg  300 mg Oral TID Reubin Milan, Monica [name]   300 mg at 05/06/21 0824   guaiFENesin-dextromethorphan (ROBITUSSIN DM) 100-10 MG/5ML syrup 5 mL  5 mL Oral Q4H PRN Reubin Milan, Monica [name]   5 mL at 05/06/21 0242   ibuprofen (ADVIL) 100 MG/5ML suspension 200 mg  200 mg Oral Q6H PRN Kathryne Eriksson, NP   200 mg at 05/06/21 0007   mirtazapine (REMERON) tablet 7.5 mg  7.5 mg Oral QHS Reubin Milan, Monica [name]   7.5 mg at 05/05/21 2120   nystatin (MYCOSTATIN) 100000 UNIT/ML suspension 500,000 Units  5 mL Oral QID Reubin Milan, Monica [name]   500,000 Units at 05/06/21 0823   ondansetron  [facility]) injection 4 mg  4 mg Intravenous TID AC & HS Jermain Curt, Monica Dad, Monica [name]   4 mg at 05/06/21 9784   Or   ondansetron (ZOFRAN) tablet 4 mg  4 mg Oral TID AC & HS Marjo Grosvenor, Monica Dad, Monica [name]   4 mg at 05/06/21 1204   pantoprazole (PROTONIX) EC tablet 40 mg  40 mg Oral Daily Reubin Milan, Monica [name]   40 mg at 05/06/21 7841   sertraline (ZOLOFT) tablet 25 mg  25 mg Oral Daily Reubin Milan, Monica [name]   25 mg at 05/06/21 [phone]  sodium chloride flush (NS) 0.9 % injection 10 mL  10 mL Intracatheter Once Raiyah Speakman, Monica Dad, Monica [name]       tucatinib (TUKYSA) tablet 150 mg  150 mg Oral BID Sadae Arrazola, Monica Dad, Monica [name]   150 mg at 05/06/21 1013   valACYclovir (VALTREX) tablet 1,000 mg  1,000 mg Oral Daily Reubin Milan, Monica [name]   1,000 mg at 05/06/21 1014    OBJECTIVE: White woman examined in the treatment area There were no vitals filed for this visit. For labs associated with the 04/23/2021 visit please see the treatment area flowsheet     There is no height or weight on file to calculate BMI.   Wt Readings from Last 3 Encounters:  05/06/21 147 lb (66.7 kg)  05/04/21 147 lb (66.7 kg)  05/01/21 149 lb (67.6 kg)  ECOG FS:1  Evaluated in the treatment area     LAB RESULTS:  CMP     Component Value Date/Time   NA 136 05/06/2021 0530   K 3.0 (L) 05/06/2021 0530   CL 105 05/06/2021 0530   CO2 24 05/06/2021 0530   GLUCOSE 121 (H) 05/06/2021 0530   BUN 13 05/06/2021 0530   CREATININE 0.53 05/06/2021 0530   CREATININE 0.62 04/23/2021 1330   CALCIUM 6.3 (LL) 05/06/2021 0530   PROT 5.4 (L) 05/06/2021 0530   ALBUMIN 2.5 (L) 05/06/2021 0530   AST 21 05/06/2021 0530   AST 88 (H) 04/23/2021 1330   ALT 26 05/06/2021 0530   ALT 106 (H) 04/23/2021 1330   ALKPHOS 125 05/06/2021 0530   BILITOT 1.3 (H) 05/06/2021 0530   BILITOT 0.8 04/23/2021 1330   GFRNONAA >60 05/06/2021 0530   GFRNONAA >60 04/23/2021 1330   GFRAA >60 06/13/2019 1138    Lab Results  Component Value Date   WBC 1.6 (L) 05/06/2021    NEUTROABS 1.3 (L) 05/06/2021   HGB 10.1 (L) 05/06/2021   HCT 30.6 (L) 05/06/2021   MCV 100.3 (H) 05/06/2021   PLT 145 (L) 05/06/2021    No results found for: LABCA2  No components found for: QQPYPP509  No results for input(s): INR in the last 168 hours.  No results found for: LABCA2  No results found for: CAN199  Lab Results  Component Value Date   CAN125 1,296.0 (H) 04/23/2021    Lab Results  Component Value Date   CAN153 599.0 (H) 04/23/2021    Lab Results  Component Value Date   CA2729 669.2 (H) 04/23/2021    No components found for: HGQUANT  No results found for: CEA1 / No results found for: CEA1   No results found for: AFPTUMOR  No results found for: CHROMOGRNA  No results found for: TOTALPROTELP, ALBUMINELP, A1GS, A2GS, BETS, BETA2SER, GAMS, MSPIKE, SPEI (this displays SPEP labs)  No results found for: KPAFRELGTCHN, LAMBDASER, KAPLAMBRATIO (kappa/lambda light chains)  No results found for: HGBA, HGBA2QUANT, HGBFQUANT, HGBSQUAN (Hemoglobinopathy evaluation)   No results found for: LDH  Lab Results  Component Value Date   IRON 69 01/16/2021   TIBC 361 01/16/2021   IRONPCTSAT 19 01/16/2021   (Iron and TIBC)  Lab Results  Component Value Date   FERRITIN 2,032 (H) 01/16/2021    Urinalysis    Component Value Date/Time   COLORURINE YELLOW 05/04/2021 1629   APPEARANCEUR HAZY (A) 05/04/2021 1629   LABSPEC 1.016 05/04/2021 1629   PHURINE 5.0 05/04/2021 [place] 05/04/2021 [place] 05/04/2021 [place] 05/04/2021 1629  KETONESUR NEGATIVE 05/04/2021 1629   PROTEINUR NEGATIVE 05/04/2021 1629   NITRITE POSITIVE (A) 05/04/2021 1629   LEUKOCYTESUR NEGATIVE 05/04/2021 1629    STUDIES: DG Chest 2 View  Result Date: 05/03/2021 CLINICAL DATA:  Shortness of breath. Weakness and fatigue. Breast cancer with active chemotherapy. EXAM: CHEST - 2 VIEW COMPARISON:  Radiograph 11/27/2020, CT 04/14/2021  FINDINGS: Right chest port with tip in the SVC. Nodular opacity in the right mid lung just inferior to the site of port, corresponds to nodular opacities on CT. The lungs are otherwise clear. No pulmonary edema, pleural effusion, or pneumothorax. Slight volume loss in the right hemithorax is stable from prior CT. Surgical clips in the right axilla. Kyphoplasty within T9 and T10. Similar compression fractures in the thoracic spine. Osseous metastatic disease with patchy areas of sclerosis, assessed on recent CT. IMPRESSION: 1. Stable nodular consolidation in the right mid lung compared with recent CT. 2. No acute intrathoracic findings. 3. Osseous metastatic disease. Electronically Signed   By: Keith Rake M.D.   On: 05/03/2021 18:35   CT Chest W Contrast  Result Date: 04/15/2021 CLINICAL DATA:  Breast cancer. Chemotherapy and radiation and 2 weeks ago. EXAM: CT CHEST WITH CONTRAST TECHNIQUE: Multidetector CT imaging of the chest was performed during intravenous contrast administration. CONTRAST:  77m OMNIPAQUE IOHEXOL 350 MG/ML SOLN COMPARISON:  11/30/2020. FINDINGS: Cardiovascular: Right IJ Port-A-Cath terminates in the right atrium. Heart size normal. Small pericardial effusion is new. Mediastinum/Nodes: Low internal jugular lymph nodes are not enlarged by CT size criteria. No pathologically enlarged mediastinal, hilar, internal mammary or axillary lymph nodes. Surgical clips in the right axilla. Esophagus is grossly unremarkable. Lungs/Pleura: Mild biapical pleuroparenchymal scarring. Nodular consolidation in the right middle lobe has progressed but is difficult to measure given adjacent volume loss. Surrounding peribronchovascular nodularity. Additional bilateral pulmonary nodules measure up to 1.5 cm in the right lower lobe (5/101), similar. No pleural fluid. Airway is unremarkable. Upper Abdomen: New and enlarging heterogeneous lesions in the liver. Index lesion in the central aspect of the liver,  spanning the left and right hepatic lobes, now measures 5.8 x 6.2 cm (2/128), previously 4.0 x 5.0 cm. Enlarged portal vein. Visualized portions of adrenal glands, kidneys, spleen, pancreas, stomach and bowel are grossly unremarkable. Musculoskeletal: Progressive mottled sclerosis throughout the visualized osseous structures. New pathologic fractures of the left second, sixth and eighth ribs. T9 and T10 vertebral body augmentations. New compression deformities involving T1, T2, T4, T6 and T7. Progressive compression deformities involving T11 and T12. L1 inferior endplate compression fracture and L2 superior endplate compression fracture, new. Pathologic manubrial fracture. IMPRESSION: 1. Interval progression of hepatic and osseous metastatic disease with multiple new pathologic fractures throughout the axial and appendicular skeleton. 2. Increasing nodular consolidation in the right middle lobe, also worrisome for some for disease progression 3. New small pericardial effusion. Electronically Signed   By: MLorin PicketM.D.   On: 04/15/2021 11:16      ELIGIBLE FOR AVAILABLE RESEARCH PROTOCOL: BCEP  ASSESSMENT: 50y.o.  [place] post right breast upper inner quadrant biopsy 12/29/2017, for a clinical T1c N0, stage IA invasive ductal carcinoma, triple positive, with an MIB-1 of 80%.  (1) genetics testing 02/02/2018 though the CancerNext gene panel offered by [facility] showed no deleterious mutations in  APC, ATM, BARD1, BMPR1A,BRCA1, BRCA2, BRIP1, CDH1, CDK4, CDKN2A, CHEK2, DICER1, HOXB13, MLH1, MRE11A, MSH2, MSH6, MUTYH, NBN, NF1, PALB2, PMS2, POLD1, POLE, PTEN, RAD50, RAD51C, RAD51D, SMAD4, SMARCA4, STK11 and TP53 (sequencing  and deletion/duplication); EPCAM and GREM1 (deletion/duplication only).  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>A) )   (2) right lumpectomy and sentinel lymph node sampling 02/16/2018 showed a pT2 pN0, stage IB invasive ductal  carcinoma, grade 3, with a positive inferior margin; a total of 5 lymph nodes were removed  (a) additional surgery 02/27/2018 cleared the margins  (3) started carboplatin, docetaxel, trastuzumab and pertuzumab 03/11/2018, repeated every 21 days x 6, last dose 07/18/2018.    (a) Pertuzumab omitted after cycle 1 due to diarrhea.  (b) Gemcitabine substituted for docetaxel starting with cycle 5 due to peripheral neuropathy  (4) continued trastuzumab to total 6 months (through February 2020).  (a) echocardiogram 01/21/2018 showed an ejection fraction in the 55-60% range  (b) repeat echocardiogram 06/14/2018 showed an ejection fraction in the 60-65%  (5) adjuvant radiation 08/15/2018 - 09/28/2018  Site/dose:   The patient initially received a dose of 50.4 Gy in 28 fractions to the right breast using whole-breast tangent fields. This was delivered using a 3-D conformal technique. The patient then received a boost to the seroma. This delivered an additional 10 Gy in 5 fractions using 12E, 9E electrons with a special teletherapy technique. The total dose was 60.4 Gy.   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, involving brain, liver, lungs, and bones (7) presenting 11/29/2020 with progressive back pain:             (a) non-contrast CT abd/pelvis (renal stone study) 11/29/2020 shows bone metastases             (b) CT chest/abd/pelvis with contrast 11/30/2020 shows multiple large liver masses, multiple pulmonary nodules, and widespread lytic bone lesions             (c) MRI brain shows at least 4 brain lesions, largest 3.8 cm             (d) total spinal MRI 11/30/2020 shows pathologic fractures at T9, T12, possibly L2, as well as numerous other spine lesions, but no epidural enhancement or cord  compression             (e) liver biopsy 12/02/2020 shows adenocarcinoma, prognostic panel again triple positive             (f) pre-op repeat MRI 12/01/2020 shows additional brain lesions, at least 8 of which will be targets for SRS   (8) CNS treatment:             (a) [place] 12/05/2020             (b) surgery 12/06/2020 confirmed metastatic carcinoma, estrogen receptor positive, HER2 amplified, progesterone receptor negative, with an MIB-1 of 70%.  (c) brain MRI 03/06/2021 shows minimal enhancement along the resection margin, previously noted lesions stable or decreased in size, some no longer seen, and no new brain lesions   (9) XRT to spine 12/05/2020 through 12/23/2020 Site Technique Total Dose (Gy) Dose per Fx (Gy) Completed Fx Beam Energies  Thoracic Spine: Spine_T9-L3 Complex 30/30 3 10/10 15X  Pelvis: Pelvis_sacrum Complex 30/30 3 10/10 15X   (10) advanced directives-- plans to name son as HCPOA with her father as secondary   (12) started capecitabine 01/08/2021, trastuzumab 01/20/2021, tucatinib 02/10/2021  (a) echo 01/14/2021 shows an ejection fraction in the 60-65% range.  (  b) capecitabine and trastuzumab discontinued 04/15/2021 with evidence of progression  (c) tucatinib continued  (12) started denosumab/Xgeva 02/10/2021, repeated every 6 weeks  (a) received one dose pamidronate in [name] 01/16/2021  (13) pain management:  (a) Tylenol 500 mg and Advil 400 mg 3 times a day w as needed  (b) gabapentin 3 times a day as needed  (c) tramadol 50 mg 4 times a day as needed   (d) bowel prophylaxis: MiraLAX daily, stool softeners 2 tablets twice daily as needed  (14) status post T9 and T10 kyphoplasty 03/07/2021  (15) Enhertu started 04/23/2021  (A) discontinued after one dose, per patient reference  (16) patient opted against further life- prolonging therapy  (A) relocation to [place] (with parents) planned     PLAN: Tynasia is in her fifth month from definitive diagnosis of  metastatic breast cancer.  It has been a rough ride but clinically there is clear improvement.  Currently her pain is very well controlled without narcotics, she has no nausea or vomiting, no headaches, and is ambulating with a walker but without falling.  On the negative side we did see disease progression on the most recent scan so she is starting Enhertu today.  I am going to see her in a virtual visit in a week just to make sure that she is tolerating this well.  She understands that she will take dexamethasone 8 mg tomorrow and the next day with breakfast.  After that she will go back to 2 mg with breakfast which is where she was before, and then move to every other day early next week  I do not have a solution for her being stuck at home by herself.  She does have her daughter who walks with her once a day and she does have a significant other that visits.  The person she is really waiting on is her son [name] who is stuck in [place] and has not been released by the [employer] for reasons that I cannot understand.  I do think she would benefit from audiobooks in perhaps she can work with her daughter to get those from the [employer]  She will see me in person again in 3 weeks with her second Enhertu dose.  Total encounter time 25 minutes.Sarajane Jews C. Solenne Manwarren, Monica [name]  05/06/21 12:06 PM Medical Oncology and Hematology [address] [place] [address], [place] [address] Tel. [phone]    Fax. [phone]   I, Wilburn Mylar, am acting as scribe for Dr. Virgie [name]. Fleda Pagel.  I, Lurline Del Monica [name], have reviewed the above documentation for accuracy and completeness, and I agree with the above.   *Total Encounter Time as defined by the Centers for Medicare and Medicaid Services includes, in addition to the face-to-face time of a patient visit (documented in the note above) non-face-to-face time: obtaining and reviewing outside history, ordering and reviewing  medications, tests or procedures, care coordination (communications with other health care professionals or caregivers) and documentation in the medical record.

## 2021-05-07 ENCOUNTER — Inpatient Hospital Stay (HOSPITAL_COMMUNITY): Payer: Medicaid Other

## 2021-05-07 ENCOUNTER — Other Ambulatory Visit: Payer: Self-pay | Admitting: Radiation Therapy

## 2021-05-07 DIAGNOSIS — D709 Neutropenia, unspecified: Secondary | ICD-10-CM | POA: Diagnosis not present

## 2021-05-07 DIAGNOSIS — R45851 Suicidal ideations: Secondary | ICD-10-CM

## 2021-05-07 DIAGNOSIS — E440 Moderate protein-calorie malnutrition: Secondary | ICD-10-CM

## 2021-05-07 DIAGNOSIS — E876 Hypokalemia: Secondary | ICD-10-CM | POA: Diagnosis not present

## 2021-05-07 DIAGNOSIS — R5081 Fever presenting with conditions classified elsewhere: Secondary | ICD-10-CM | POA: Diagnosis not present

## 2021-05-07 DIAGNOSIS — R197 Diarrhea, unspecified: Secondary | ICD-10-CM | POA: Diagnosis not present

## 2021-05-07 DIAGNOSIS — C50211 Malignant neoplasm of upper-inner quadrant of right female breast: Secondary | ICD-10-CM

## 2021-05-07 DIAGNOSIS — Z170 Estrogen receptor positive status [ER+]: Secondary | ICD-10-CM

## 2021-05-07 LAB — BASIC METABOLIC PANEL
Anion gap: 7 (ref 5–15)
Anion gap: 6 (ref 5–15)
BUN: 14 mg/dL (ref 6–20)
BUN: 10 mg/dL (ref 6–20)
CO2: 21 mmol/L — ABNORMAL LOW (ref 22–32)
CO2: 23 mmol/L (ref 22–32)
Calcium: 6 mg/dL — CL (ref 8.9–10.3)
Calcium: 6.4 mg/dL — CL (ref 8.9–10.3)
Chloride: 107 mmol/L (ref 98–111)
Chloride: 110 mmol/L (ref 98–111)
Creatinine, Ser: 0.45 mg/dL (ref 0.44–1.00)
Creatinine, Ser: 0.46 mg/dL (ref 0.44–1.00)
GFR, Estimated: >60 mL/min (ref >60)
GFR, Estimated: >60 mL/min (ref >60)
Glucose, Bld: 109 mg/dL — ABNORMAL HIGH (ref 70–99)
Glucose, Bld: 109 mg/dL — ABNORMAL HIGH (ref 70–99)
Potassium: 2.6 mmol/L — CL (ref 3.5–5.1)
Potassium: 2.6 mmol/L — CL (ref 3.5–5.1)
Sodium: 135 mmol/L (ref 135–145)
Sodium: 139 mmol/L (ref 135–145)

## 2021-05-07 LAB — CBC WITH DIFFERENTIAL/PLATELET
Abs Immature Granulocytes: 0.26 10*3/uL — ABNORMAL HIGH (ref 0.00–0.07)
Basophils Absolute: 0 10*3/uL (ref 0.0–0.1)
Basophils Relative: 1 %
Eosinophils Absolute: 0.1 10*3/uL (ref 0.0–0.5)
Eosinophils Relative: 3 %
HCT: 28.5 % — ABNORMAL LOW (ref 36.0–46.0)
Hemoglobin: 9.3 g/dL — ABNORMAL LOW (ref 12.0–15.0)
Immature Granulocytes: 7 %
Lymphocytes Relative: 6 %
Lymphs Abs: 0.2 10*3/uL — ABNORMAL LOW (ref 0.7–4.0)
MCH: 33 pg (ref 26.0–34.0)
MCHC: 32.6 g/dL (ref 30.0–36.0)
MCV: 101.1 fL — ABNORMAL HIGH (ref 80.0–100.0)
Monocytes Absolute: 0.6 10*3/uL (ref 0.1–1.0)
Monocytes Relative: 15 %
Neutro Abs: 2.5 10*3/uL (ref 1.7–7.7)
Neutrophils Relative %: 68 %
Platelets: 157 10*3/uL (ref 150–400)
RBC: 2.82 MIL/uL — ABNORMAL LOW (ref 3.87–5.11)
RDW: 22.5 % — ABNORMAL HIGH (ref 11.5–15.5)
WBC: 3.7 10*3/uL — ABNORMAL LOW (ref 4.0–10.5)
nRBC: 0.8 % — ABNORMAL HIGH (ref 0.0–0.2)

## 2021-05-07 LAB — PHOSPHORUS: Phosphorus: <1 mg/dL — CL (ref 2.5–4.6)

## 2021-05-07 IMAGING — MR MR HEAD WO/W CM
13 series · 48 of 48 positions shown · IV contrast (gadavist)
Comparison: MRI of the brain [DATE].

CLINICAL DATA: Breast cancer, assess treatment response; follow
brain metastases.

EXAM:
MRI HEAD WITHOUT AND WITH CONTRAST
TECHNIQUE: Multiplanar, multiecho pulse sequences of the brain and surrounding
structures were obtained without and with intravenous contrast.
CONTRAST:  7mL GADAVIST GADOBUTROL 1 MMOL/ML IV SOLN

[Series 5: DWI · axial · 3.0mm · 1.36mm/px · z∈[-46,+106]mm · 6 of 104 slices shown (1 of 2)]
[im 1/104]
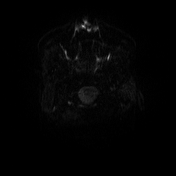
[im 21/104]
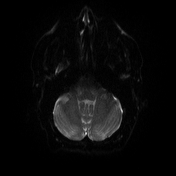
[im 42/104]
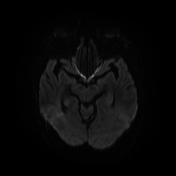
[im 62/104]
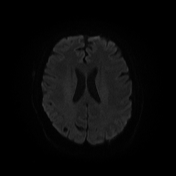
[im 83/104]
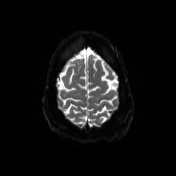
[im 104/104]
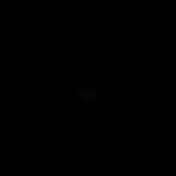

[Series 6: DWI · axial · 3.0mm · 1.36mm/px · z∈[-46,+106]mm · 3 of 51 slices shown (2 of 2)]
[im 1/51]
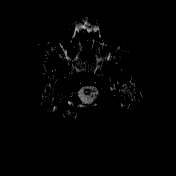
[im 26/51]
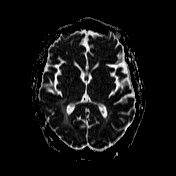
[im 51/51]
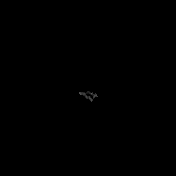

[Series 7: T1 · sagittal · 5.0mm · 0.75mm/px · 1 of 24 slices shown (1 of 2)]
[im 1/24]
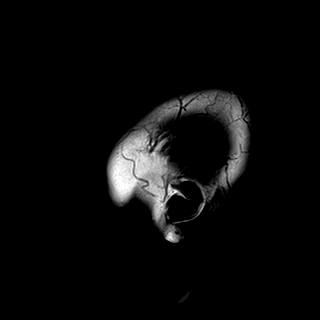

[Series 8: T2 · axial · 5.0mm · 0.62mm/px · z∈[-64,+99]mm · 2 of 26 slices shown]
[im 1/26]
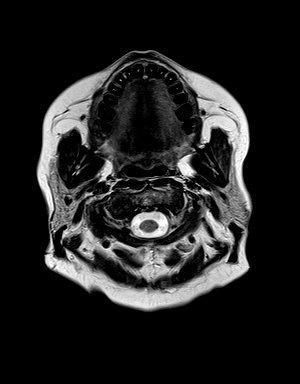
[im 26/26]
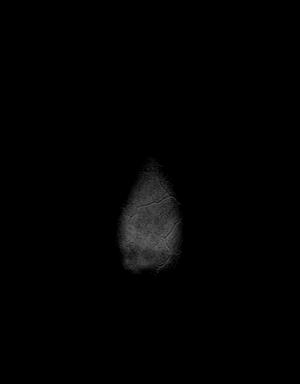

[Series 9: swi_images · axial · 3.0mm · 0.75mm/px · z∈[-65,+100]mm · 3 of 56 slices shown]
[im 1/56]
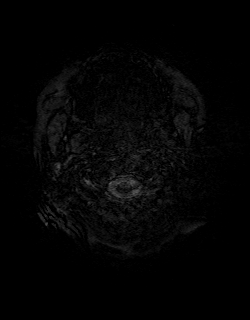
[im 28/56]
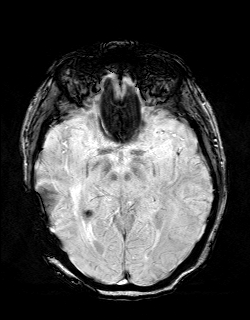
[im 56/56]
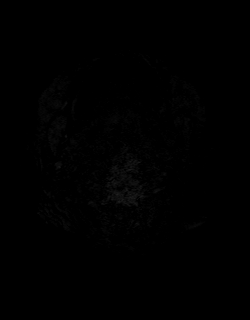

[Series 11: FLAIR · axial · 3.0mm · 0.75mm/px · z∈[-62,+97]mm · 3 of 54 slices shown]
[im 1/54]
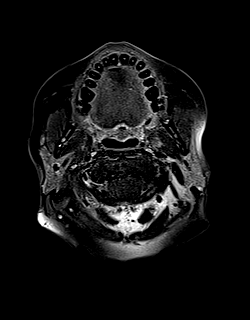
[im 27/54]
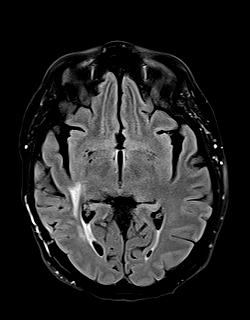
[im 54/54]
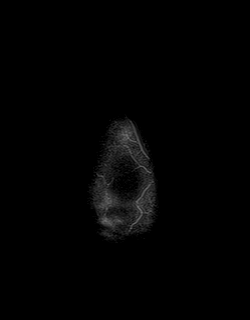

[Series 12: T1 · axial · 1.0mm · 0.94mm/px · z∈[-60,+99]mm · 10 of 160 slices shown (2 of 2)]
[im 1/160]
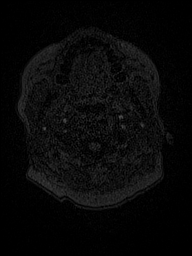
[im 18/160]
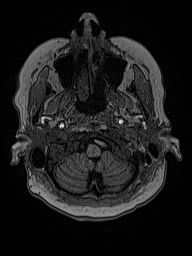
[im 36/160]
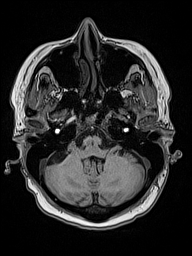
[im 54/160]
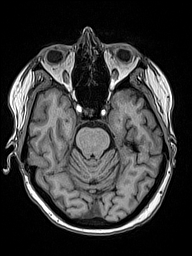
[im 71/160]
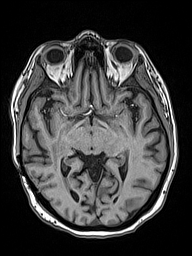
[im 89/160]
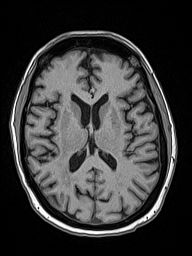
[im 107/160]
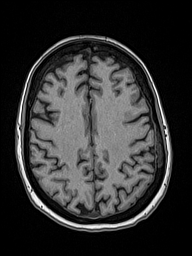
[im 124/160]
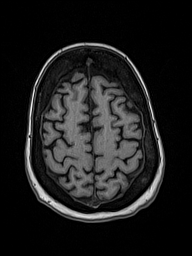
[im 142/160]
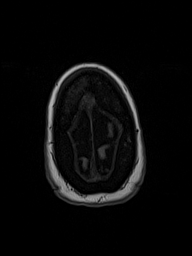
[im 160/160]
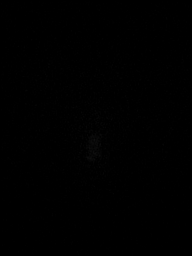

[Series 13: cor dwi_tracew · coronal · 5.0mm · 1.53mm/px · 3 of 56 slices shown]
[im 1/56]
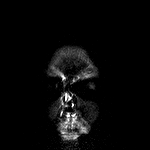
[im 28/56]
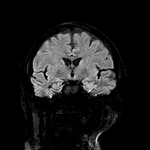
[im 56/56]
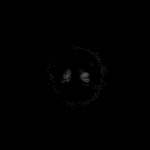

[Series 14: cor dwi_adc · coronal · 5.0mm · 1.53mm/px · 2 of 28 slices shown]
[im 1/28]
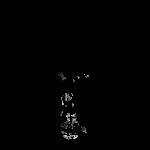
[im 28/28]
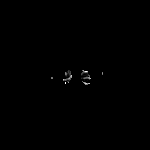

[Series 15: T2 post-contrast · coronal · 5.0mm · 0.57mm/px · 2 of 28 slices shown]
[im 1/28]
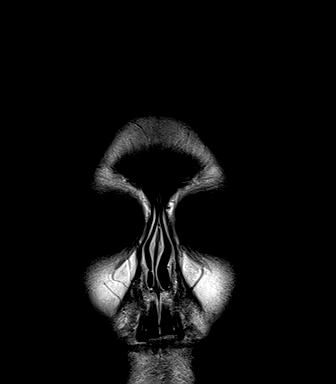
[im 28/28]
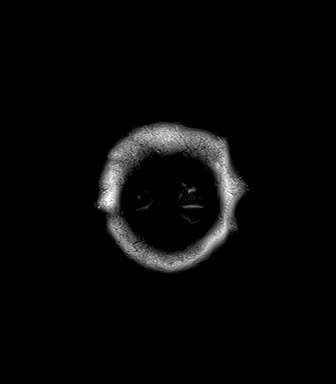

[Series 16: T1 post-contrast · axial · 1.0mm · 0.94mm/px · z∈[-60,+99]mm · 10 of 160 slices shown (1 of 3)]
[im 1/160]
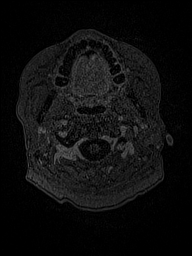
[im 18/160]
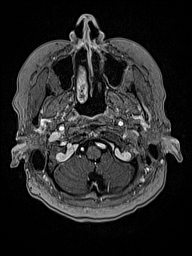
[im 36/160]
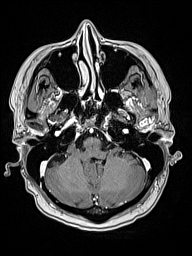
[im 54/160]
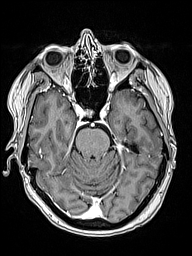
[im 71/160]
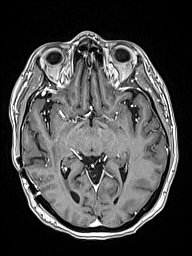
[im 89/160]
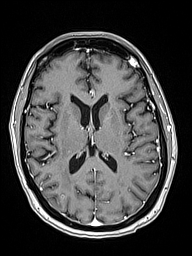
[im 107/160]
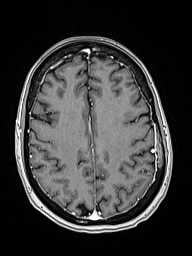
[im 124/160]
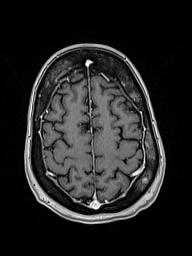
[im 142/160]
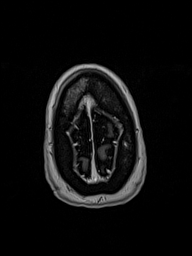
[im 160/160]
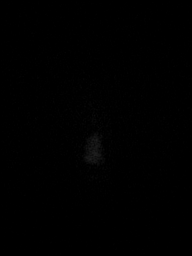

[Series 17: T1 post-contrast · coronal · 5.0mm · 0.43mm/px · 2 of 28 slices shown (2 of 3)]
[im 1/28]
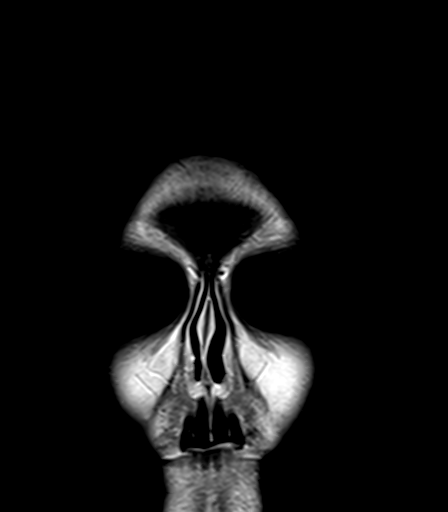
[im 28/28]
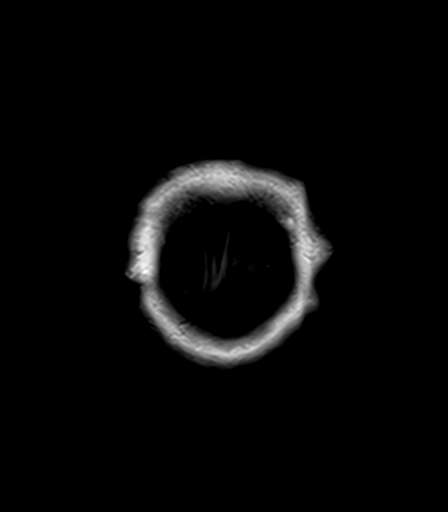

[Series 18: T1 post-contrast · sagittal · 5.0mm · 0.75mm/px · 1 of 24 slices shown (3 of 3)]
[im 1/24]
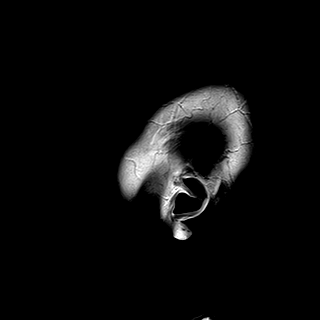

[48 of 48 positions shown; findings below may reference images not displayed]

FINDINGS: Brain: No acute infarction, hemorrhage, hydrocephalus or extra-axial
collection.

Postsurgical changes from right temporal lobe lesion resection with
residual marginal contrast enhancement in the surgical cavity,
stable to mildly decreased from prior MRI.

Three enhancing parenchymal lesions have decreased in size when
compared to prior MRI:

* Right pre frontal soft gyrus (series 16, image 131), 4 mm,
compared to 6 mm on prior.

* Left superior frontal gyrus (series 18, image 15), 2 mm, compared
to 3 mm on prior.

* Left temporal lobe (series 16, image 57) 2 mm, compared to 4 mm on
prior.

A right occipital lobe (series 16, image 92; series 18, image 8),
has similar size when compared to prior MRI, measuring 10 x 6 mm but
with decreased degree of contrast enhancement.

A left cerebellar hemisphere lesion has grown from 2 mm to 4 mm in
the current study (series 16, image 37).

Questionable new punctate focus of contrast enhancement in the left
frontal lobe in the anterior orbital gyrus (series 16, image 79).

Vascular: Normal flow voids.

Skull and upper cervical spine: Status post right temporal
craniotomy. Interval increase in size of the osseous lesions
involving the bilateral frontal bones, left parietal bone, left
mastoid process, left lateral aspect of the C1 anterior arch and
left mandibular condyle. Diffuse decreased T1 signal of the
visualized upper cervical spine with contrast enhancement,
consistent with metastatic disease appear similar.

Sinuses/Orbits: Negative.

Other: None.
IMPRESSION: 1. Mixed interval response of parenchymal lesions with interval
decrease in size of 3 supratentorial lesions, 1 stable
supratentorial lesion and interval growth of 1 cerebellar lesion
with questionable punctate new lesion in the left frontal lobe.
2. Interval progression of the osseous metastatic lesions, as
described above.

## 2021-05-07 MED ORDER — POTASSIUM CHLORIDE 10 MEQ/100ML IV SOLN
10.0000 meq | INTRAVENOUS | Status: AC
Start: 2021-05-07 — End: 2021-05-07
  Administered 2021-05-07 (×2): 10 meq via INTRAVENOUS
  Filled 2021-05-07 (×2): qty 100

## 2021-05-07 MED ORDER — POTASSIUM & SODIUM PHOSPHATES 280-160-250 MG PO PACK
1.0000 | PACK | Freq: Once | ORAL | Status: AC
  Administered 2021-05-07: 1 via ORAL
  Filled 2021-05-07: qty 1

## 2021-05-07 MED ORDER — POTASSIUM CHLORIDE CRYS ER 20 MEQ PO TBCR
40.0000 meq | EXTENDED_RELEASE_TABLET | Freq: Three times a day (TID) | ORAL | Status: DC
Start: 2021-05-07 — End: 2021-05-07
  Filled 2021-05-07: qty 2

## 2021-05-07 MED ORDER — CALCIUM GLUCONATE-NACL 1-0.675 GM/50ML-% IV SOLN
1.0000 g | Freq: Once | INTRAVENOUS | Status: AC
  Administered 2021-05-07: 1000 mg via INTRAVENOUS
  Filled 2021-05-07: qty 50

## 2021-05-07 MED ORDER — MAGNESIUM SULFATE IN D5W 1-5 GM/100ML-% IV SOLN
1.0000 g | Freq: Once | INTRAVENOUS | Status: AC
  Administered 2021-05-07: 1 g via INTRAVENOUS
  Filled 2021-05-07: qty 100

## 2021-05-07 MED ORDER — POTASSIUM CHLORIDE IN NACL 40-0.9 MEQ/L-% IV SOLN
INTRAVENOUS | Status: DC
  Administered 2021-05-09: 75 mL/h via INTRAVENOUS
  Filled 2021-05-07 (×3): qty 1000

## 2021-05-07 MED ORDER — LOPERAMIDE HCL 2 MG PO CAPS
4.0000 mg | ORAL_CAPSULE | ORAL | Status: DC | PRN
  Administered 2021-05-07 (×3): 4 mg via ORAL
  Filled 2021-05-07 (×3): qty 2

## 2021-05-07 MED ORDER — LORAZEPAM 0.5 MG PO TABS
0.2500 mg | ORAL_TABLET | Freq: Three times a day (TID) | ORAL | Status: DC
  Administered 2021-05-07 – 2021-05-11 (×15): 0.25 mg via ORAL
  Filled 2021-05-07 (×15): qty 1

## 2021-05-07 MED ORDER — MIRTAZAPINE 30 MG PO TABS
30.0000 mg | ORAL_TABLET | Freq: Every day | ORAL | Status: DC
  Administered 2021-05-07 – 2021-05-10 (×4): 30 mg via ORAL
  Filled 2021-05-07 (×4): qty 1

## 2021-05-07 MED ORDER — SODIUM CHLORIDE 0.9 % IV SOLN: Freq: Once | INTRAVENOUS | Status: DC

## 2021-05-07 MED ORDER — GADOBUTROL 1 MMOL/ML IV SOLN
7.0000 mL | Freq: Once | INTRAVENOUS | Status: AC | PRN
  Administered 2021-05-07: 7 mL

## 2021-05-07 MED ORDER — SERTRALINE HCL 50 MG PO TABS
50.0000 mg | ORAL_TABLET | Freq: Every day | ORAL | Status: DC
  Administered 2021-05-08 – 2021-05-11 (×4): 50 mg via ORAL
  Filled 2021-05-07 (×4): qty 1

## 2021-05-07 MED ORDER — CHOLESTYRAMINE LIGHT 4 G PO PACK
4.0000 g | PACK | Freq: Three times a day (TID) | ORAL | Status: DC
  Administered 2021-05-07 – 2021-05-10 (×6): 4 g via ORAL
  Filled 2021-05-07 (×13): qty 1

## 2021-05-07 MED ORDER — BISMUTH SUBSALICYLATE 262 MG PO CHEW
524.0000 mg | CHEWABLE_TABLET | ORAL | Status: AC | PRN
  Administered 2021-05-07 – 2021-05-10 (×2): 524 mg via ORAL
  Filled 2021-05-07 (×4): qty 2

## 2021-05-07 MED ORDER — OCTREOTIDE ACETATE 100 MCG/ML IJ SOLN
100.0000 ug | Freq: Two times a day (BID) | INTRAMUSCULAR | Status: AC
  Administered 2021-05-07 – 2021-05-10 (×6): 100 ug via SUBCUTANEOUS
  Filled 2021-05-07 (×7): qty 1

## 2021-05-07 NOTE — Consult Note (Signed)
[facility] Face-to-Face Psychiatry Consult   Reason for Consult:  Suicidal and Depression Referring Physician: Emergency room provider Patient Identification: Laqueta Bonaventura MRN:  [id number] Principal Diagnosis: Febrile neutropenia ([place]) Diagnosis:  Principal Problem:   Febrile neutropenia ([place]) Active Problems:   Malignant neoplasm of upper-inner quadrant of right breast in female, estrogen receptor positive ([place])   Hypokalemia   Suicidal ideation   Major depressive disorder   Diarrhea   Malnutrition of moderate degree   Total Time spent with patient: 45 minutes  Subjective:   Adaleigh Warf is a 50 y.o. female patient admitted with depression and suicidal ideations.  Patient presents from [facility], with a current diagnosis of febrile neutropenia in which she developed of high fever, low white blood cell count, and is currently being managed by Dr. Jana Hakim hematologist.  Patient is seen and reassessed by the psychiatric nurse practitioner, she continues to endorse depressive symptoms and passive suicidal ideations.  She continues to have a poor appetite, and actually declines her lunch during the evaluation.  She is encouraged to eat her food and at least attempt to eat.  Patient remains compliant with her Zoloft 25 mg and Remeron 7.5 mg, that is being used to manage her depression, insomnia, and poor appetite.  She denies any side effects, and adverse reactions to include worsening nausea and or GI symptoms at this time.  She states her parents have not arrived yet, however she is hoping that they arrive soon and safely.  She continues to be optimistic and future oriented, that she will be able to return to her parents home where she can receive additional care and services to include outpatient behavioral health programming.  At present she continues to endorse passive suicidal ideations, and worsening depressive symptoms.  She continues to deny any mania, paranoia, psychosis,  hallucinations, and otherwise psychiatric symptoms.  She further denies any active suicidal thoughts or ideations, and or self-harm behaviors.  Our psychiatry team will continue to follow this patient until safe disposition is found.  Suspect patient will not have to return to inpatient psychiatric hospital.  HPI:  50 year old female significant history of breast cancer currently undergoing chemotherapy and radiation brought here via EMS with complaint of for depression.  Patient states she is here because she feels very depressed with having suicidal thoughts but no specific plan.  She attributed to her depression stemming from active cancer diagnosis that she thought she previously have.  But then this cancer returns.  She also mention that she lives alone and she has no motivation to get up this morning to do anything.  She feels dehydrated because she does not have any appetite due to loss of taste and smell has been ongoing for several weeks. She denies homicidal ideation auditory or visual hallucination.  She is sleeping more.  She has never been formally diagnosed with depression.  Denies any active pain at this time.  Past Psychiatric History: See above  Risk to Self: Yes Risk to Others: Denies  Prior Inpatient Therapy: Denies  Prior Outpatient Therapy: Denies  Past Medical History:  Past Medical History:  Diagnosis Date   Arthritis    lower back   Cancer ([place])    right breast cancer   Family history of breast cancer    Personal history of chemotherapy    Personal history of radiation therapy     Past Surgical History:  Procedure Laterality Date   ABDOMINAL HYSTERECTOMY     APPLICATION OF CRANIAL NAVIGATION  N/A 12/06/2020   Procedure: APPLICATION OF CRANIAL NAVIGATION;  Surgeon: Judith Part, MD;  Location: [place];  Service: Neurosurgery;  Laterality: N/A;  RM 20   BREAST LUMPECTOMY Right    BREAST LUMPECTOMY WITH RADIOACTIVE SEED AND SENTINEL LYMPH NODE BIOPSY Right  02/16/2018   Procedure: RIGHT BREAST LUMPECTOMY WITH RADIOACTIVE SEED AND SENTINEL LYMPH NODE BIOPSY;  Surgeon: [name], MD;  Location: [place];  Service: General;  Laterality: Right;   CRANIOTOMY Right 12/06/2020   Procedure: Right sided awake craniotomy for tumor resection;  Surgeon: Judith Part, MD;  Location: [place];  Service: Neurosurgery;  Laterality: Right;   HYMENECTOMY     IR IMAGING GUIDED PORT INSERTION  03/12/2021   IR US GUIDE BX ASP/DRAIN  12/02/2020   KYPHOPLASTY N/A 03/07/2021   Procedure: Thoracic nine, Thoracic ten KYPHOPLASTY;  Surgeon: Judith Part, MD;  Location: [place];  Service: Neurosurgery;  Laterality: N/A;   PORTACATH PLACEMENT Right 02/16/2018   Procedure: INSERTION PORT-A-CATH;  Surgeon: [name], MD;  Location: [place];  Service: General;  Laterality: Right;   RE-EXCISION OF BREAST LUMPECTOMY Right 02/22/2018   Procedure: RE-EXCISION OF RIGHT  BREAST LUMPECTOMY;  Surgeon: [name], MD;  Location: [place];  Service: General;  Laterality: Right;   REPAIR VAGINAL CUFF N/A 02/07/2017   Procedure: REPAIR VAGINAL CUFF;  Surgeon: Ena Dawley, MD;  Location: [facility];  Service: Gynecology;  Laterality: N/A;   ROBOTIC ASSISTED TOTAL HYSTERECTOMY WITH SALPINGECTOMY Left 01/20/2017   Procedure: ROBOTIC ASSISTED TOTAL HYSTERECTOMY WITH SALPINGECTOMY;  Surgeon: [name], MD;  Location: [facility];  Service: Gynecology;  Laterality: Left;   TOTAL HIP ARTHROPLASTY Right 2022   total hip and partial femur, per pt   Family History:  Family History  Problem Relation Age of Onset   Lung cancer Maternal Grandfather    Esophageal cancer Paternal Grandfather 80   Breast cancer Cousin 27   Family Psychiatric  History: Sister diagnosed with depression, daughter diagnosed with depression and anxiety Social History:  Social History   Substance and Sexual Activity  Alcohol Use Not Currently      Social History   Substance and Sexual Activity  Drug Use No    Social History   Socioeconomic History   Marital status: Legally Separated    Spouse name: Not on file   Number of children: Not on file   Years of education: Not on file   Highest education level: Not on file  Occupational History   Not on file  Tobacco Use   Smoking status: Never   Smokeless tobacco: Never  Vaping Use   Vaping Use: Former  Substance and Sexual Activity   Alcohol use: Not Currently   Drug use: No   Sexual activity: Not Currently    Birth control/protection: Surgical  Other Topics Concern   Not on file  Social History Narrative   Not on file   Social Determinants of Health   Financial Resource Strain: Not on file  Food Insecurity: Not on file  Transportation Needs: Not on file  Physical Activity: Not on file  Stress: Not on file  Social Connections: Not on file   Additional Social History:    Allergies:  No Known Allergies  Labs:  Results for orders placed or performed during the hospital encounter of 05/05/21 (from the past 48 hour(s))  MRSA Next Gen by PCR, Nasal     Status: None   Collection Time:  05/05/21  4:14 PM   Specimen: Nasal Mucosa; Nasal Swab  Result Value Ref Range   MRSA by PCR Next Gen NOT DETECTED NOT DETECTED    Comment: (NOTE) The GeneXpert MRSA Assay (FDA approved for NASAL specimens only), is one component of a comprehensive MRSA colonization surveillance program. It is not intended to diagnose MRSA infection nor to guide or monitor treatment for MRSA infections. Test performance is not FDA approved in patients less than 12 years old. Performed at [facility], [place] [address] [place], [place] [address]   CBC WITH DIFFERENTIAL     Status: Abnormal   Collection Time: 05/06/21  5:30 AM  Result Value Ref Range   WBC 1.6 (L) 4.0 - 10.5 K/uL   RBC 3.05 (L) 3.87 - 5.11 MIL/uL   Hemoglobin 10.1 (L) 12.0 - 15.0 g/dL   HCT 30.6 (L) 36.0 -  46.0 %   MCV 100.3 (H) 80.0 - 100.0 fL   MCH 33.1 26.0 - 34.0 pg   MCHC 33.0 30.0 - 36.0 g/dL   RDW 22.4 (H) 11.5 - 15.5 %   Platelets 145 (L) 150 - 400 K/uL   nRBC 1.3 (H) 0.0 - 0.2 %   Neutrophils Relative % 75 %   Neutro Abs 1.3 (L) 1.7 - 7.7 K/uL   Band Neutrophils 4 %   Lymphocytes Relative 9 %   Lymphs Abs 0.1 (L) 0.7 - 4.0 K/uL   Monocytes Relative 4 %   Monocytes Absolute 0.1 0.1 - 1.0 K/uL   Eosinophils Relative 2 %   Eosinophils Absolute 0.0 0.0 - 0.5 K/uL   Basophils Relative 0 %   Basophils Absolute 0.0 0.0 - 0.1 K/uL   WBC Morphology DOHLE BODIES     Comment: MODERATE LEFT SHIFT (>5% METAS AND MYELOS,OCC PRO NOTED) TOXIC GRANULATION    Myelocytes 6 %   Abs Immature Granulocytes 0.10 (H) 0.00 - 0.07 K/uL   Polychromasia PRESENT    Ovalocytes PRESENT     Comment: Performed at [facility], [place] [address], [place], [place] [address]  Comprehensive metabolic panel     Status: Abnormal   Collection Time: 05/06/21  5:30 AM  Result Value Ref Range   Sodium 136 135 - 145 mmol/L   Potassium 3.0 (L) 3.5 - 5.1 mmol/L   Chloride 105 98 - 111 mmol/L   CO2 24 22 - 32 mmol/L   Glucose, Bld 121 (H) 70 - 99 mg/dL    Comment: Glucose reference range applies only to samples taken after fasting for at least 8 hours.   BUN 13 6 - 20 mg/dL   Creatinine, Ser 0.53 0.44 - 1.00 mg/dL   Calcium 6.3 (LL) 8.9 - 10.3 mg/dL    Comment: CRITICAL RESULT CALLED TO, READ BACK BY AND VERIFIED WITH: ALLEN,P. RN AT 4742 05/06/21 MULLINS,T    Total Protein 5.4 (L) 6.5 - 8.1 g/dL   Albumin 2.5 (L) 3.5 - 5.0 g/dL   AST 21 15 - 41 U/L   ALT 26 0 - 44 U/L   Alkaline Phosphatase 125 38 - 126 U/L   Total Bilirubin 1.3 (H) 0.3 - 1.2 mg/dL   GFR, Estimated >60 >60 mL/min    Comment: (NOTE) Calculated using the CKD-EPI Creatinine Equation (2021)    Anion gap 7 5 - 15    Comment: Performed at [facility], [place] [address], [place], [place] [address]  C Difficile  Quick Screen w PCR reflex  Status: None   Collection Time: 05/06/21 10:10 AM   Specimen: STOOL  Result Value Ref Range   C Diff antigen NEGATIVE NEGATIVE   C Diff toxin NEGATIVE NEGATIVE   C Diff interpretation No C. difficile detected.     Comment: Performed at [facility], [place] [address] [place], [place] [address]  CBC WITH DIFFERENTIAL     Status: Abnormal   Collection Time: 05/07/21  5:03 AM  Result Value Ref Range   WBC 3.7 (L) 4.0 - 10.5 K/uL   RBC 2.82 (L) 3.87 - 5.11 MIL/uL   Hemoglobin 9.3 (L) 12.0 - 15.0 g/dL   HCT 28.5 (L) 36.0 - 46.0 %   MCV 101.1 (H) 80.0 - 100.0 fL   MCH 33.0 26.0 - 34.0 pg   MCHC 32.6 30.0 - 36.0 g/dL   RDW 22.5 (H) 11.5 - 15.5 %   Platelets 157 150 - 400 K/uL   nRBC 0.8 (H) 0.0 - 0.2 %   Neutrophils Relative % 68 %   Neutro Abs 2.5 1.7 - 7.7 K/uL   Lymphocytes Relative 6 %   Lymphs Abs 0.2 (L) 0.7 - 4.0 K/uL   Monocytes Relative 15 %   Monocytes Absolute 0.6 0.1 - 1.0 K/uL   Eosinophils Relative 3 %   Eosinophils Absolute 0.1 0.0 - 0.5 K/uL   Basophils Relative 1 %   Basophils Absolute 0.0 0.0 - 0.1 K/uL   WBC Morphology MILD LEFT SHIFT (1-5% METAS, OCC MYELO, OCC BANDS)    Immature Granulocytes 7 %   Abs Immature Granulocytes 0.26 (H) 0.00 - 0.07 K/uL   Polychromasia PRESENT     Comment: Performed at [facility], [place] [address], [place], [place] [address]  Basic metabolic panel     Status: Abnormal   Collection Time: 05/07/21  5:03 AM  Result Value Ref Range   Sodium 135 135 - 145 mmol/L   Potassium 2.6 (LL) 3.5 - 5.1 mmol/L    Comment: CRITICAL RESULT CALLED TO, READ BACK BY AND VERIFIED WITH:  Joesphine Bare RN 05/07/21 @0608  VS     Chloride 107 98 - 111 mmol/L   CO2 21 (L) 22 - 32 mmol/L   Glucose, Bld 109 (H) 70 - 99 mg/dL    Comment: Glucose reference range applies only to samples taken after fasting for at least 8 hours.   BUN 14 6 - 20 mg/dL   Creatinine, Ser 0.45 0.44 - 1.00 mg/dL    Calcium 6.0 (LL) 8.9 - 10.3 mg/dL    Comment: CRITICAL RESULT CALLED TO, READ BACK BY AND VERIFIED WITH:  Joesphine Bare RN 05/07/21 @0608  VS     GFR, Estimated >60 >60 mL/min    Comment: (NOTE) Calculated using the CKD-EPI Creatinine Equation (2021)    Anion gap 7 5 - 15    Comment: Performed at [facility], [place] [address], [place], [place] [address]    Current Facility-Administered Medications  Medication Dose Route Frequency Provider Last Rate Last Admin   0.9 % NaCl with KCl 40 mEq / L  infusion   Intravenous Continuous Richarda Osmond, MD 75 mL/hr at 05/07/21 1319 New Bag at 05/07/21 1319   acetaminophen (TYLENOL) tablet 650 mg  650 mg Oral Q6H PRN Reubin Milan, MD   650 mg at 05/06/21 0544   Or   acetaminophen (TYLENOL) suppository 650 mg  650 mg Rectal Q6H PRN Reubin Milan, MD  ceFEPIme (MAXIPIME) 2 g in sodium chloride 0.9 % 100 mL IVPB  2 g Intravenous Q8H Reubin Milan, MD 200 mL/hr at 05/07/21 1324 2 g at 05/07/21 1324   Chlorhexidine Gluconate Cloth 2 % PADS 6 each  6 each Topical Daily Richarda Osmond, MD   6 each at 05/07/21 1043   enoxaparin (LOVENOX) injection 40 mg  40 mg Subcutaneous Q24H Reubin Milan, MD   40 mg at 05/06/21 1837   feeding supplement (ENSURE ENLIVE / ENSURE PLUS) liquid 237 mL  237 mL Oral TID BM Richarda Osmond, MD   237 mL at 05/07/21 1324   fluconazole (DIFLUCAN) tablet 100 mg  100 mg Oral Daily Reubin Milan, MD   100 mg at 05/07/21 1042   gabapentin (NEURONTIN) capsule 300 mg  300 mg Oral TID Reubin Milan, MD   300 mg at 05/07/21 1041   guaiFENesin-dextromethorphan (ROBITUSSIN DM) 100-10 MG/5ML syrup 5 mL  5 mL Oral Q4H PRN Reubin Milan, MD   5 mL at 05/06/21 0242   ibuprofen (ADVIL) 100 MG/5ML suspension 200 mg  200 mg Oral Q6H PRN Kathryne Eriksson, NP   200 mg at 05/06/21 0007   loperamide (IMODIUM) capsule 4 mg  4 mg Oral PRN Richarda Osmond, MD   4 mg at  05/07/21 1334   mirtazapine (REMERON) tablet 7.5 mg  7.5 mg Oral QHS Reubin Milan, MD   7.5 mg at 05/06/21 2204   multivitamin with minerals tablet 1 tablet  1 tablet Oral Daily Richarda Osmond, MD   1 tablet at 05/07/21 1042   nystatin (MYCOSTATIN) 100000 UNIT/ML suspension 500,000 Units  5 mL Oral QID Reubin Milan, MD   500,000 Units at 05/07/21 1042   ondansetron [facility]) injection 4 mg  4 mg Intravenous TID AC & HS Magrinat, [name], MD   4 mg at 05/06/21 2206   Or   ondansetron (ZOFRAN) tablet 4 mg  4 mg Oral TID AC & HS Magrinat, [name], MD   4 mg at 05/07/21 1142   pantoprazole (PROTONIX) EC tablet 40 mg  40 mg Oral Daily Reubin Milan, MD   40 mg at 05/07/21 1043   potassium chloride SA (KLOR-CON) CR tablet 40 mEq  40 mEq Oral TID Richarda Osmond, MD       sertraline (ZOLOFT) tablet 25 mg  25 mg Oral Daily Reubin Milan, MD   25 mg at 05/07/21 1042   tucatinib (TUKYSA) tablet 150 mg  150 mg Oral BID Magrinat, [name], MD   150 mg at 05/07/21 1142   valACYclovir (VALTREX) tablet 1,000 mg  1,000 mg Oral Daily Reubin Milan, MD   1,000 mg at 05/07/21 1041   Facility-Administered Medications Ordered in Other Encounters  Medication Dose Route Frequency Provider Last Rate Last Admin   sodium chloride flush (NS) 0.9 % injection 10 mL  10 mL Intracatheter Once Magrinat, [name], MD        Musculoskeletal: Strength & Muscle Tone: within normal limits Gait & Station: normal Patient leans: N/A            Psychiatric Specialty Exam:  Presentation  General Appearance: Appropriate for Environment; Casual  Eye Contact:Fair  Speech:Clear and Coherent; Normal Rate  Speech Volume:Normal  Handedness:Right   Mood and Affect  Mood:Euthymic  Affect:Appropriate; Congruent   Thought Process  Thought Processes:Coherent; Linear  Descriptions of Associations:Intact  Orientation:Full (Time, Place and Person)  Thought  Content:Logical  History of Schizophrenia/Schizoaffective disorder:No data recorded Duration of Psychotic Symptoms:No data recorded Hallucinations:Hallucinations: None  Ideas of Reference:None  Suicidal Thoughts:Suicidal Thoughts: Yes, Passive SI Passive Intent and/or Plan: With Intent; With Plan; With Means to [place]; With Access to Means  Homicidal Thoughts:Homicidal Thoughts: No   Sensorium  Memory:Immediate Fair; Recent Good; Remote Good  Judgment:Fair  Insight:Fair   Executive Functions  Concentration:Fair  Attention Span:Fair  [place]   Psychomotor Activity  Psychomotor Activity:Psychomotor Activity: Normal   Assets  Assets:Communication Skills; Financial Resources/Insurance; Desire for Improvement; Transportation; Physical Health; Resilience; Housing; Leisure Time; Social Support   Sleep  Sleep:Sleep: Fair  Physical Exam: Physical Exam Vitals and nursing note reviewed.  Constitutional:      Appearance: Normal appearance. She is normal weight.  HENT:     Head: Normocephalic.  Eyes:     Pupils: Pupils are equal, round, and reactive to light.  Skin:    General: Skin is warm and dry.  Neurological:     General: No focal deficit present.     Mental Status: She is alert and oriented to person, place, and time. Mental status is at baseline.  Psychiatric:        Attention and Perception: Attention and perception normal.        Mood and Affect: Mood is depressed. Affect is flat.        Speech: Speech normal.        Behavior: Behavior normal. Behavior is cooperative.        Thought Content: Thought content includes suicidal ideation.        Cognition and Memory: Cognition and memory normal.        Judgment: Judgment is impulsive.   Review of Systems  Psychiatric/Behavioral:  Positive for depression and suicidal ideas.   All other systems reviewed and are negative. Blood pressure 114/77, pulse 98,  temperature 98.3 F (36.8 C), temperature source Oral, resp. rate 16, height 5\' 8"  (1.727 m), weight 72.4 kg, last menstrual period 12/21/2016, SpO2 96 %. Body mass index is 24.27 kg/m.  Treatment Plan Summary: Plan   MDD severe: Patient presents with worsening depressive symptoms to include anhedonia, recurrent thoughts of death, hopelessness,worthlessness, suicidal thoughts and ideations with a plan to overdose on meds. She reports no improvement since her brief 3 day stay at [facility].  In which she was started on Remeron and Zoloft, discussed GI side effects from both medications are to be expected with transient.  For about 4 to 5 days.  Reviewed course of expectation to include antidepressants taken approximately 4 to 6 weeks before therapeutic effect is seen in which patient verbalizes.    -Parents have not yet arrived, however patient remains hopeful that they will be arriving soon so that we can prepare for safe disposition and transition of care.  Patient does states she is able to contract for safety, and feels safe going home with her parents where they will be able to help manage her care, medication management, and increase her overall morale. -Psychiatry will continue to follow until patient is medically stable for discharge.  It is likely patient will not need to go back to inpatient psychiatric admission if she is cleared. -We will begin working closely with [facility] for outpatient psychiatric resources, once parents have presented to the hospital.  Will likely need location to establish closer proximity for outpatient behavioral health services to include partial hospitalization programming.    Disposition: No  evidence of imminent risk to self or others at present.   Supportive therapy provided about ongoing stressors. Discussed crisis plan, support from social network, calling 911, coming to the Emergency Department, and calling Suicide Hotline. Refer to PHP.   Suella Broad,  FNP 05/07/2021 1:53 PM

## 2021-05-07 NOTE — Progress Notes (Signed)
Monica [name]   DOB:1971-03-19   CH#:[id number]   ZPS#:[id number]  Subjective:  Monica [name] is if anything more frustrated and depressed than yesterday. Her mother is in the room, as is an LPN. Her phone and other property from behavioral health has not come over. Monica [name] tells me physical therapy came by and she can walk OK (PT eval states "no acute PT needs at this time" and has signed off). Monica [name] is still having liquid diarrhea "all the time. She is also still not eating. No pain at present.  Objective:  Vitals:   05/07/21 0441 05/07/21 1403  BP: 114/77 111/82  Pulse: 98 86  Resp: 16 16  Temp: 98.3 F (36.8 C) 98.5 F (36.9 C)  SpO2: 96% 99%    Body mass index is 24.27 kg/m.  Intake/Output Summary (Last 24 hours) at 05/07/2021 1727 Last data filed at 05/07/2021 1500 Gross per 24 hour  Intake 3057.76 ml  Output 1891 ml  Net 1166.76 ml     Sclerae unicteric  Lungs no rales or wheezes--auscultated anterolaterally  Heart regular rate and rhythm  Abdomen soft, +BS  Neuro nonfocal;    CBG (last 3)  No results for input(s): GLUCAP in the last 72 hours.   Labs:  Lab Results  Component Value Date   WBC 3.7 (L) 05/07/2021   HGB 9.3 (L) 05/07/2021   HCT 28.5 (L) 05/07/2021   MCV 101.1 (H) 05/07/2021   PLT 157 05/07/2021   NEUTROABS 2.5 05/07/2021    '@LASTCHEMISTRY' @  Urine Studies No results for input(s): UHGB, CRYS in the last 72 hours.  Invalid input(s): UACOL, UAPR, USPG, UPH, UTP, UGL, UKET, UBIL, UNIT, UROB, [place], UEPI, UWBC, URBC, [place], [place], [place], [place]  Basic Metabolic Panel: Recent Labs  Lab 05/01/21 1116 05/02/21 1848 05/03/21 2223 05/05/21 0658 05/05/21 1150 05/06/21 0530 05/07/21 0503  NA 132* 135 134* 133*  --  136 135  K 3.5 3.4* 3.0* 3.1*  --  3.0* 2.6*  CL 102 106 103 101  --  105 107  CO2 21* 20* 22 26  --  24 21*  GLUCOSE 96 150* 130* 155*  --  121* 109*  BUN '18 20 11 7  ' --  13 14  CREATININE 0.56 0.66 0.52 0.45  --  0.53 0.45  CALCIUM 7.6* 7.5*  7.3* 7.0*  --  6.3* 6.0*  MG 2.1  --   --   --  1.9  --   --   PHOS 2.3*  --   --   --   --   --   --    GFR Estimated Creatinine Clearance: 84.9 mL/min (by C-G formula based on SCr of 0.45 mg/dL). Liver Function Tests: Recent Labs  Lab 05/01/21 1116 05/02/21 1848 05/06/21 0530  AST 34 31 21  ALT 53* 49* 26  ALKPHOS 123 141* 125  BILITOT 1.0 1.0 1.3*  PROT 6.0* 6.4* 5.4*  ALBUMIN 3.3* 3.6 2.5*   No results for input(s): LIPASE, AMYLASE in the last 168 hours. No results for input(s): AMMONIA in the last 168 hours. Coagulation profile No results for input(s): INR, PROTIME in the last 168 hours.  CBC: Recent Labs  Lab 05/01/21 1116 05/03/21 0636 05/03/21 0643 05/03/21 2223 05/05/21 0658 05/06/21 0530 05/07/21 0503  WBC 2.4*  --  1.3* 1.3* 0.8* 1.6* 3.7*  NEUTROABS 2.0 0.9*  --   --  0.3* 1.3* 2.5  HGB 11.2*  --  11.3* 10.3* 10.2* 10.1* 9.3*  HCT 34.0*  --  35.7* 31.5* 31.1* 30.6* 28.5*  MCV 100.6*  --  103.8* 101.6* 103.3* 100.3* 101.1*  PLT 151  --  142* 130* 131* 145* 157   Cardiac Enzymes: No results for input(s): CKTOTAL, CKMB, CKMBINDEX, TROPONINI in the last 168 hours. BNP: Invalid input(s): POCBNP CBG: No results for input(s): GLUCAP in the last 168 hours. D-Dimer No results for input(s): DDIMER in the last 72 hours. Hgb A1c No results for input(s): HGBA1C in the last 72 hours. Lipid Profile No results for input(s): CHOL, HDL, LDLCALC, TRIG, CHOLHDL, LDLDIRECT in the last 72 hours. Thyroid function studies No results for input(s): TSH, T4TOTAL, T3FREE, THYROIDAB in the last 72 hours.  Invalid input(s): FREET3 Anemia work up No results for input(s): VITAMINB12, FOLATE, FERRITIN, TIBC, IRON, RETICCTPCT in the last 72 hours. Microbiology Recent Results (from the past 240 hour(s))  Resp Panel by RT-PCR (Flu A&B, Covid) Nasopharyngeal Swab     Status: None   Collection Time: 05/01/21 10:39 AM   Specimen: Nasopharyngeal Swab; Nasopharyngeal(NP) swabs in  vial transport medium  Result Value Ref Range Status   SARS Coronavirus 2 by RT PCR NEGATIVE NEGATIVE Final    Comment: (NOTE) SARS-CoV-2 target nucleic acids are NOT DETECTED.  The SARS-CoV-2 RNA is generally detectable in upper respiratory specimens during the acute phase of infection. The lowest concentration of SARS-CoV-2 viral copies this assay can detect is 138 copies/mL. A negative result does not preclude SARS-Cov-2 infection and should not be used as the sole basis for treatment or other patient management decisions. A negative result may occur with  improper specimen collection/handling, submission of specimen other than nasopharyngeal swab, presence of viral mutation(s) within the areas targeted by this assay, and inadequate number of viral copies(<138 copies/mL). A negative result must be combined with clinical observations, patient history, and epidemiological information. The expected result is Negative.  Fact Sheet for Patients:  EntrepreneurPulse.com.au  Fact Sheet for Healthcare Providers:  IncredibleEmployment.be  This test is no t yet approved or cleared by the [place] FDA and  has been authorized for detection and/or diagnosis of SARS-CoV-2 by FDA under an Emergency Use Authorization (EUA). This EUA will remain  in effect (meaning this test can be used) for the duration of the COVID-19 declaration under Section 564(b)(1) of the Act, 21 U.S.C.section 360bbb-3(b)(1), unless the authorization is terminated  or revoked sooner.       Influenza A by PCR NEGATIVE NEGATIVE Final   Influenza B by PCR NEGATIVE NEGATIVE Final    Comment: (NOTE) The Xpert Xpress SARS-CoV-2/FLU/RSV plus assay is intended as an aid in the diagnosis of influenza from Nasopharyngeal swab specimens and should not be used as a sole basis for treatment. Nasal washings and aspirates are unacceptable for Xpert Xpress SARS-CoV-2/FLU/RSV testing.  Fact  Sheet for Patients: EntrepreneurPulse.com.au  Fact Sheet for Healthcare Providers: IncredibleEmployment.be  This test is not yet approved or cleared by the [place] FDA and has been authorized for detection and/or diagnosis of SARS-CoV-2 by FDA under an Emergency Use Authorization (EUA). This EUA will remain in effect (meaning this test can be used) for the duration of the COVID-19 declaration under Section 564(b)(1) of the Act, 21 U.S.C. section 360bbb-3(b)(1), unless the authorization is terminated or revoked.  Performed at [facility], [place] [address], [place], [place] [address]   Blood culture (routine x 2)     Status: None (Preliminary result)   Collection Time: 05/05/21  9:55 AM   Specimen: BLOOD  Result Value  Ref Range Status   Specimen Description   Final    BLOOD RIGHT ANTECUBITAL Performed at [facility] [address] [place], [place] [address]    Special Requests   Final    BOTTLES DRAWN AEROBIC AND ANAEROBIC Blood Culture results may not be optimal due to an inadequate volume of blood received in culture bottles Performed at [facility] [address] [place], [place] [address]    Culture   Final    NO GROWTH 2 DAYS Performed at [facility] [address] [place], [place] [address]    Report Status PENDING  Incomplete  Blood culture (routine x 2)     Status: None (Preliminary result)   Collection Time: 05/05/21 10:42 AM   Specimen: BLOOD  Result Value Ref Range Status   Specimen Description   Final    BLOOD LEFT ANTECUBITAL Performed at [facility] [address] [place], [place] [address]    Special Requests   Final    BOTTLES DRAWN AEROBIC AND ANAEROBIC Blood Culture adequate volume Performed at [facility] [address] [place], [place] [address]    Culture   Final    NO GROWTH 2 DAYS Performed at  [facility] [address], [place], [place] [address]    Report Status PENDING  Incomplete  Resp Panel by RT-PCR (Flu A&B, Covid) Nasopharyngeal Swab     Status: None   Collection Time: 05/05/21  1:46 PM   Specimen: Nasopharyngeal Swab; Nasopharyngeal(NP) swabs in vial transport medium  Result Value Ref Range Status   SARS Coronavirus 2 by RT PCR NEGATIVE NEGATIVE Final    Comment: (NOTE) SARS-CoV-2 target nucleic acids are NOT DETECTED.  The SARS-CoV-2 RNA is generally detectable in upper respiratory specimens during the acute phase of infection. The lowest concentration of SARS-CoV-2 viral copies this assay can detect is 138 copies/mL. A negative result does not preclude SARS-Cov-2 infection and should not be used as the sole basis for treatment or other patient management decisions. A negative result may occur with  improper specimen collection/handling, submission of specimen other than nasopharyngeal swab, presence of viral mutation(s) within the areas targeted by this assay, and inadequate number of viral copies(<138 copies/mL). A negative result must be combined with clinical observations, patient history, and epidemiological information. The expected result is Negative.  Fact Sheet for Patients:  EntrepreneurPulse.com.au  Fact Sheet for Healthcare Providers:  IncredibleEmployment.be  This test is no t yet approved or cleared by the [place] FDA and  has been authorized for detection and/or diagnosis of SARS-CoV-2 by FDA under an Emergency Use Authorization (EUA). This EUA will remain  in effect (meaning this test can be used) for the duration of the COVID-19 declaration under Section 564(b)(1) of the Act, 21 U.S.C.section 360bbb-3(b)(1), unless the authorization is terminated  or revoked sooner.       Influenza A by PCR NEGATIVE NEGATIVE Final   Influenza B by PCR NEGATIVE NEGATIVE Final    Comment: (NOTE) The Xpert  Xpress SARS-CoV-2/FLU/RSV plus assay is intended as an aid in the diagnosis of influenza from Nasopharyngeal swab specimens and should not be used as a sole basis for treatment. Nasal washings and aspirates are unacceptable for Xpert Xpress SARS-CoV-2/FLU/RSV testing.  Fact Sheet for Patients: EntrepreneurPulse.com.au  Fact Sheet for Healthcare Providers: IncredibleEmployment.be  This test is not yet approved or cleared by the [place] FDA and has been authorized for detection  and/or diagnosis of SARS-CoV-2 by FDA under an Emergency Use Authorization (EUA). This EUA will remain in effect (meaning this test can be used) for the duration of the COVID-19 declaration under Section 564(b)(1) of the Act, 21 U.S.C. section 360bbb-3(b)(1), unless the authorization is terminated or revoked.  Performed at [facility], [place] [address], [place], [place] [address]   MRSA Next Gen by PCR, Nasal     Status: None   Collection Time: 05/05/21  4:14 PM   Specimen: Nasal Mucosa; Nasal Swab  Result Value Ref Range Status   MRSA by PCR Next Gen NOT DETECTED NOT DETECTED Final    Comment: (NOTE) The GeneXpert MRSA Assay (FDA approved for NASAL specimens only), is one component of a comprehensive MRSA colonization surveillance program. It is not intended to diagnose MRSA infection nor to guide or monitor treatment for MRSA infections. Test performance is not FDA approved in patients less than 40 years old. Performed at [facility], [place] [address] [place], [place] [address]   C Difficile Quick Screen w PCR reflex     Status: None   Collection Time: 05/06/21 10:10 AM   Specimen: STOOL  Result Value Ref Range Status   C Diff antigen NEGATIVE NEGATIVE Final   C Diff toxin NEGATIVE NEGATIVE Final   C Diff interpretation No C. difficile detected.  Final    Comment: Performed at [facility], [place]  [address] [place], [place] [address]      Studies:  MR BRAIN W WO CONTRAST  Result Date: 05/07/2021 CLINICAL DATA:  Breast cancer, assess treatment response; follow brain metastases. EXAM: MRI HEAD WITHOUT AND WITH CONTRAST TECHNIQUE: Multiplanar, multiecho pulse sequences of the brain and surrounding structures were obtained without and with intravenous contrast. CONTRAST:  49m GADAVIST GADOBUTROL 1 MMOL/ML IV SOLN COMPARISON:  MRI of the brain March 06, 2021. FINDINGS: Brain: No acute infarction, hemorrhage, hydrocephalus or extra-axial collection. Postsurgical changes from right temporal lobe lesion resection with residual marginal contrast enhancement in the surgical cavity, stable to mildly decreased from prior MRI. Three enhancing parenchymal lesions have decreased in size when compared to prior MRI: * Right pre frontal soft gyrus (series 16, image 131), 4 mm, compared to 6 mm on prior. * Left superior frontal gyrus (series 18, image 15), 2 mm, compared to 3 mm on prior. * Left temporal lobe (series 16, image 57) 2 mm, compared to 4 mm on prior. A right occipital lobe (series 16, image 92; series 18, image 8), has similar size when compared to prior MRI, measuring 10 x 6 mm but with decreased degree of contrast enhancement. A left cerebellar hemisphere lesion has grown from 2 mm to 4 mm in the current study (series 16, image 37). Questionable new punctate focus of contrast enhancement in the left frontal lobe in the anterior orbital gyrus (series 16, image 79). Vascular: Normal flow voids. Skull and upper cervical spine: Status post right temporal craniotomy. Interval increase in size of the osseous lesions involving the bilateral frontal bones, left parietal bone, left mastoid process, left lateral aspect of the C1 anterior arch and left mandibular condyle. Diffuse decreased T1 signal of the visualized upper cervical spine with contrast enhancement, consistent with metastatic disease appear similar.  Sinuses/Orbits: Negative. Other: None. IMPRESSION: 1. Mixed interval response of parenchymal lesions with interval decrease in size of 3 supratentorial lesions, 1 stable supratentorial lesion and interval growth of 1 cerebellar lesion with questionable punctate new lesion in the  left frontal lobe. 2. Interval progression of the osseous metastatic lesions, as described above. Electronically Signed   By: Pedro Earls M.D.   On: 05/07/2021 13:56    Assessment: 50 y.o.  Monica [name], Monica [name] woman status post right breast upper inner quadrant biopsy 12/29/2017, for a clinical T1c N0, stage IA invasive ductal carcinoma, triple positive, with an MIB-1 of 80%.   (1) genetics testing 02/02/2018 though the CancerNext gene panel offered by [facility] showed no deleterious mutations in  APC, ATM, BARD1, BMPR1A,BRCA1, BRCA2, BRIP1, CDH1, CDK4, CDKN2A, CHEK2, DICER1, HOXB13, MLH1, MRE11A, MSH2, MSH6, MUTYH, NBN, NF1, PALB2, PMS2, POLD1, POLE, PTEN, RAD50, RAD51C, RAD51D, SMAD4, SMARCA4, STK11 and TP53 (sequencing and deletion/duplication); EPCAM and GREM1 (deletion/duplication only).             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               (a) Pertuzumab omitted after cycle 1 due to diarrhea.             (b) Gemcitabine substituted for docetaxel starting with cycle 5 due to peripheral neuropathy   (4) continued trastuzumab to total 6 months (through February 2020).             (a) echocardiogram 01/21/2018 showed an ejection fraction in the 55-60% range             (b)  repeat echocardiogram 06/14/2018 showed an ejection fraction in the 60-65%   (5) adjuvant radiation 08/15/2018 - 09/28/2018  Site/dose:   The patient initially received a dose of 50.4 Gy in 28 fractions to the right breast using whole-breast tangent fields. This was delivered using a 3-D conformal technique. The patient then received a boost to the seroma. This delivered an additional 10 Gy in 5 fractions using 12E, 9E electrons with a special teletherapy technique. The total dose was 60.4 Gy.    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, involving brain, liver, lungs, and bones (7) presenting 11/29/2020 with progressive back pain:             (a) non-contrast CT abd/pelvis (renal stone study) 11/29/2020 shows bone metastases             (b) CT chest/abd/pelvis with contrast 11/30/2020 shows multiple large liver masses, multiple pulmonary nodules, and widespread lytic bone lesions             (c) MRI brain shows at least 4 brain lesions, largest 3.8 cm             (d) total spinal MRI 11/30/2020 shows pathologic fractures at T9, T12, possibly L2, as well  as numerous other spine lesions, but no epidural enhancement or cord compression             (e) liver biopsy 12/02/2020 shows adenocarcinoma, prognostic panel again triple positive             (f) pre-op repeat MRI 12/01/2020 shows additional brain lesions, at least 8 of which will be targets for SRS   (8) CNS treatment:             (a) [place] 12/05/2020             (b) surgery 12/06/2020 confirmed metastatic carcinoma, estrogen receptor positive, HER2 amplified, progesterone receptor negative, with an MIB-1 of 70%.             (c) brain MRI 03/06/2021 shows minimal enhancement along the resection margin, previously  noted lesions stable or decreased in size, some no longer seen, and no new brain lesions   (9) XRT to spine 12/05/2020 through 12/23/2020 Site Technique Total Dose (Gy) Dose per Fx (Gy) Completed Fx Beam Energies  Thoracic Spine: Spine_T9-L3 Complex 30/30 3 10/10 15X  Pelvis: Pelvis_sacrum Complex 30/30 3 10/10 15X    (10) advanced directives-- plans to name son as HCPOA with her father as secondary   (61) started capecitabine 01/08/2021, trastuzumab 01/20/2021, tucatinib 02/10/2021             (a) echo 01/14/2021 shows an ejection fraction in the 60-65% range.             (b) capecitabine and trastuzumab discontinued 04/15/2021 with evidence of progression             (c) tucatinib continued   (12) started denosumab/Xgeva 02/10/2021, repeated every 6 weeks             (a) received one dose pamidronate in hospital 01/16/2021   (13) pain management:             (a) Tylenol 500 mg and Advil 400 mg 3 times a day w as needed             (b) gabapentin 3 times a day as needed             (c) tramadol 50 mg 4 times a day as needed              (d) bowel prophylaxis: MiraLAX daily, stool softeners 2 tablets twice daily as needed   (14) status post T9 and T10 kyphoplasty 03/07/2021   (15) Enhertu started 04/23/2021  (16) febrile neutropenia 05/05/2021   Plan:  Demonica is now day 15 cycle 1 Enhertu. She is no longer neutropenic.  Her fever has resolved.  C. difficile has returned negative.  Blood cultures are negative to date.  She is at risk for refeeding syndrome given her severe malnutrition and in fact her potassium and calcium are both low.  Is very low.  I am going to add phosphorus and magnesium.  It might be best to replenish in these intravenously given her very poor oral intake at this point.  I discussed her brain MRI with her.  This is essentially stable to slightly improved.  I am going to increase her Remeron and Zoloft.  I am also going to add low-dose benzodiazepines during  the day.  Once her electrolytes have been corrected we can consider adding Decadron low-dose  She has had several Imodium doses with very little effect.  I am going to add Questran and octreotide.   I have  asked nursing to contact psychiatry to see if the are willing to release the patient to her parents so she can be eventually discharged under their care.  Chauncey Cruel, MD 05/07/2021  5:27 PM Medical Oncology and Hematology [facility] [address] [place], [place] [address] Tel. [phone]    Fax. [phone]

## 2021-05-07 NOTE — Progress Notes (Addendum)
PROGRESS NOTE  Monica [name]    DOB: [birth year], 50 y.o.  AST:[id number]  PCP: Beverley Fiedler, FNP   Code Status: Full Code   DOA: 05/05/2021   LOS: 2  Brief Narrative of Current Hospitalization  Monica [name] is a 50 y.o. female with a PMH significant for osteoarthritis, depression, metastatic breast cancer, recent behavioral health admission for suicidal ideations. They presented from behavioral health hospital to the ED on 05/05/2021 with abdominal pain, nausea, vomiting x 1 days. In the ED, it was found that they had a fever with low white blood cell count. They were treated with IV antibiotics, IV fluids, Tylenol.  Heme/onc was consulted.  Patient was admitted to medicine service for further workup and management of neutropenic fever as outlined in detail below.  05/07/21 -stable, improved  Assessment & Plan  Principal Problem:   Febrile neutropenia ([place]) Active Problems:   Malignant neoplasm of upper-inner quadrant of right breast in female, estrogen receptor positive (H[facility])   Hypokalemia   Suicidal ideation   Major depressive disorder   Diarrhea   Malnutrition of moderate degree  Febrile neutropenia-in setting of malignancy. improved.  Patient heart rate has decreased 90s today and remains afebrile with decreasing temperature. WBC continues to improve to 3.7 today. Cdiff was negative. - imodium PRN for diarrhea - heme onc following, appreciate recommendations -Continue cefepime -As needed antiemetics  Hypokalemia- resistant to repletion yesterday, K+ 2.6 today. Likely related to her continued diarrhea.  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  -Management per heme-onc -Brain MRI 10/26 -PT/OT evaluation  DVT prophylaxis: enoxaparin (LOVENOX) injection 40 mg Start: 05/05/21 1800  Diet:  Diet Orders (From admission, onward)     Start     Ordered   05/05/21 1534  Diet Heart Room service appropriate? Yes; Fluid consistency: Thin  Diet effective now       Question Answer Comment  Room service appropriate? Yes   Fluid consistency: Thin      05/05/21 1533            Subjective 05/07/21    Pt reports not doing well. He biggest complaint is her diarrhea. Otherwise no concerns.  Disposition Plan & Communication  Status is: Inpatient  Remains inpatient appropriate because: ongoing workup     Inpatient To Be Determined  Family Communication: none  Consults, Procedures, Significant Events  Consultants:  Heme/onc psychiatry  Procedures/significant events:  Brain MRI 10/26  Antimicrobials:  Anti-infectives (From admission, onward)    Start     Dose/Rate Route Frequency Ordered Stop   05/06/21 1000  valACYclovir (VALTREX) tablet 1,000 mg       Note to Pharmacy: Take one tablet twice a day for one week, then daily     1,000 mg Oral Daily 05/05/21 1633     05/06/21 1000  fluconazole (DIFLUCAN) tablet 100 mg        100 mg Oral Daily 05/05/21 1633     05/05/21 2000  ceFEPIme (MAXIPIME) 2 g in sodium chloride 0.9 % 100 mL IVPB        2 g 200 mL/hr over 30 Minutes Intravenous Every 8 hours 05/05/21 1533  Objective   Vitals:   05/06/21 0646 05/06/21 1558 05/06/21 2030 05/07/21 0441  BP: 111/74  111/78 114/77  Pulse: (!) 102  97 98  Resp: 16  16 16   Temp: 99.4 F (37.4 C)  98.6 F (37 C) 98.3 F (36.8 C)  TempSrc: Oral  Oral Oral  SpO2: 96%  98% 96%  Weight:  72.4 kg    Height:  5\' 8"  (1.727 m)      Intake/Output Summary (Last 24 hours) at 05/07/2021 0727 Last data filed at 05/07/2021 0400 Gross per  24 hour  Intake 477 ml  Output 651 ml  Net -174 ml    Filed Weights   05/06/21 0248 05/06/21 1558  Weight: 66.7 kg 72.4 kg    Patient BMI: Body mass index is 24.27 kg/m.   Physical Exam: General: awake, alert, NAD HEENT: atraumatic, clear conjunctiva, anicteric sclera, eyes watering, moist mucus membranes, hearing grossly normal Respiratory: normal respiratory effort. Cardiovascular: normal S1/S2,  RRR, no JVD, murmurs, rubs, gallops, quick capillary refill  Nervous: A&O x3. no gross focal neurologic deficits, normal speech Extremities: moves all equally, no edema, normal tone Skin: dry, intact, normal temperature, normal color, No rashes, lesions or ulcers Psychiatry: flat mood, affect  Labs   I have personally reviewed following labs and imaging studies Admission on 05/05/2021  Component Date Value Ref Range Status   MRSA by PCR Next Gen 05/05/2021 NOT DETECTED  NOT DETECTED Final   C Diff antigen 05/06/2021 NEGATIVE  NEGATIVE Final   C Diff toxin 05/06/2021 NEGATIVE  NEGATIVE Final   C Diff interpretation 05/06/2021 No C. difficile detected.   Final   WBC 05/06/2021 1.6 (A)  4.0 - 10.5 K/uL Final   RBC 05/06/2021 3.05 (A)  3.87 - 5.11 MIL/uL Final   Hemoglobin 05/06/2021 10.1 (A)  12.0 - 15.0 g/dL Final   HCT 05/06/2021 30.6 (A)  36.0 - 46.0 % Final   MCV 05/06/2021 100.3 (A)  80.0 - 100.0 fL Final   MCH 05/06/2021 33.1  26.0 - 34.0 pg Final   MCHC 05/06/2021 33.0  30.0 - 36.0 g/dL Final   RDW 05/06/2021 22.4 (A)  11.5 - 15.5 % Final   Platelets 05/06/2021 145 (A)  150 - 400 K/uL Final   nRBC 05/06/2021 1.3 (A)  0.0 - 0.2 % Final   Neutrophils Relative % 05/06/2021 75  % Final   Neutro Abs 05/06/2021 1.3 (A)  1.7 - 7.7 K/uL Final   Band Neutrophils 05/06/2021 4  % Final   Lymphocytes Relative 05/06/2021 9  % Final   Lymphs Abs 05/06/2021 0.1 (A)  0.7 - 4.0 K/uL Final   Monocytes Relative 05/06/2021 4  % Final   Monocytes Absolute 05/06/2021 0.1  0.1 - 1.0 K/uL Final    Eosinophils Relative 05/06/2021 2  % Final   Eosinophils Absolute 05/06/2021 0.0  0.0 - 0.5 K/uL Final   Basophils Relative 05/06/2021 0  % Final   Basophils Absolute 05/06/2021 0.0  0.0 - 0.1 K/uL Final   WBC Morphology 05/06/2021 DOHLE BODIES   Final   Myelocytes 05/06/2021 6  % Final   Abs Immature Granulocytes 05/06/2021 0.10 (A)  0.00 - 0.07 K/uL Final   Polychromasia 05/06/2021 PRESENT   Final   Ovalocytes 05/06/2021 PRESENT   Final   Sodium 05/06/2021 136  135 - 145 mmol/L Final   Potassium 05/06/2021 3.0 (A)  3.5 - 5.1 mmol/L Final   Chloride 05/06/2021 105  98 - 111 mmol/L Final  CO2 05/06/2021 24  22 - 32 mmol/L Final   Glucose, Bld 05/06/2021 121 (A)  70 - 99 mg/dL Final   BUN 05/06/2021 13  6 - 20 mg/dL Final   Creatinine, Ser 05/06/2021 0.53  0.44 - 1.00 mg/dL Final   Calcium 05/06/2021 6.3 (A)  8.9 - 10.3 mg/dL Final   Total Protein 05/06/2021 5.4 (A)  6.5 - 8.1 g/dL Final   Albumin 05/06/2021 2.5 (A)  3.5 - 5.0 g/dL Final   AST 05/06/2021 21  15 - 41 U/L Final   ALT 05/06/2021 26  0 - 44 U/L Final   Alkaline Phosphatase 05/06/2021 125  38 - 126 U/L Final   Total Bilirubin 05/06/2021 1.3 (A)  0.3 - 1.2 mg/dL Final   GFR, Estimated 05/06/2021 >60  >60 mL/min Final   Anion gap 05/06/2021 7  5 - 15 Final   WBC 05/07/2021 3.7 (A)  4.0 - 10.5 K/uL Final   RBC 05/07/2021 2.82 (A)  3.87 - 5.11 MIL/uL Final   Hemoglobin 05/07/2021 9.3 (A)  12.0 - 15.0 g/dL Final   HCT 05/07/2021 28.5 (A)  36.0 - 46.0 % Final   MCV 05/07/2021 101.1 (A)  80.0 - 100.0 fL Final   MCH 05/07/2021 33.0  26.0 - 34.0 pg Final   MCHC 05/07/2021 32.6  30.0 - 36.0 g/dL Final   RDW 05/07/2021 22.5 (A)  11.5 - 15.5 % Final   Platelets 05/07/2021 157  150 - 400 K/uL Final   nRBC 05/07/2021 0.8 (A)  0.0 - 0.2 % Final   Neutrophils Relative % 05/07/2021 68  % Final   Neutro Abs 05/07/2021 2.5  1.7 - 7.7 K/uL Final   Lymphocytes Relative 05/07/2021 6  % Final   Lymphs Abs 05/07/2021 0.2 (A)  0.7 - 4.0  K/uL Final   Monocytes Relative 05/07/2021 15  % Final   Monocytes Absolute 05/07/2021 0.6  0.1 - 1.0 K/uL Final   Eosinophils Relative 05/07/2021 3  % Final   Eosinophils Absolute 05/07/2021 0.1  0.0 - 0.5 K/uL Final   Basophils Relative 05/07/2021 1  % Final   Basophils Absolute 05/07/2021 0.0  0.0 - 0.1 K/uL Final   WBC Morphology 05/07/2021 MILD LEFT SHIFT (1-5% METAS, OCC MYELO, OCC BANDS)   Final   Immature Granulocytes 05/07/2021 7  % Final   Abs Immature Granulocytes 05/07/2021 0.26 (A)  0.00 - 0.07 K/uL Final   Polychromasia 05/07/2021 PRESENT   Final   Sodium 05/07/2021 135  135 - 145 mmol/L Final   Potassium 05/07/2021 2.6 (A)  3.5 - 5.1 mmol/L Final   Chloride 05/07/2021 107  98 - 111 mmol/L Final   CO2 05/07/2021 21 (A)  22 - 32 mmol/L Final   Glucose, Bld 05/07/2021 109 (A)  70 - 99 mg/dL Final   BUN 05/07/2021 14  6 - 20 mg/dL Final   Creatinine, Ser 05/07/2021 0.45  0.44 - 1.00 mg/dL Final   Calcium 05/07/2021 6.0 (A)  8.9 - 10.3 mg/dL Final   GFR, Estimated 05/07/2021 >60  >60 mL/min Final   Anion gap 05/07/2021 7  5 - 15 Final    Imaging Studies  No results found. Medications   Scheduled Meds:  Chlorhexidine Gluconate Cloth  6 each Topical Daily   enoxaparin (LOVENOX) injection  40 mg Subcutaneous Q24H   feeding supplement  237 mL Oral TID BM   fluconazole  100 mg Oral Daily   gabapentin  300 mg Oral TID   mirtazapine  7.5 mg Oral QHS   multivitamin with minerals  1 tablet Oral Daily   nystatin  5 mL Oral QID   ondansetron (ZOFRAN) IV  4 mg Intravenous TID AC & HS   Or   ondansetron  4 mg Oral TID AC & HS   pantoprazole  40 mg Oral Daily   potassium chloride  40 mEq Oral BID   sertraline  25 mg Oral Daily   tucatinib  150 mg Oral BID   valACYclovir  1,000 mg Oral Daily   No recently discontinued medications to reconcile  LOS: 2 days   Time spent: >35min  Elayna Tobler L Tawny Raspberry, DO [facility] 05/07/2021, 7:27 AM    Please refer to amion to  contact the [facility] Attending or Consulting provider for this pt  [email or web address] Available by Epic secure chat 7AM-7PM. If 7PM-7AM, please contact night-coverage

## 2021-05-07 NOTE — BHH Suicide Risk Assessment (Signed)
[facility] Discharge Suicide Risk Assessment   Principal Problem: MDD (major depressive disorder), single episode with melancholic features Discharge Diagnoses: Principal Problem:   MDD (major depressive disorder), single episode with melancholic features Active Problems:   Malignant neoplasm of upper-inner quadrant of right breast in female, estrogen receptor positive ([place])   Suicidal ideation   Total Time spent with patient:  0 minutes -patient was transferred/discharged to the emergency department, prior to the MD evaluation, on the day of discharge.  Monica [name] is a 50 year old female with no prior psychiatric history and a medical history of Stage IV, metastatic triple positive breast cancer admitted voluntarily for worsening depression and SI.  During hospitalization, Zoloft and Remeron were started for depression, poor appetite, and insomnia. During hospitalization, patient was transferred to the less emergency department for work-up of neutropenia, and was sent back to [place]. On the day of discharge, the patient had abnormal labs that were significant for low WBC and low ANC, and the patient was febrile, and it was decided to again transfer the patient to the emergency department for further work-up.   This Probation officer did not be obtained to evaluate the patient to assess mental status prior to transfer to the emergency department.   Musculoskeletal: This writer was unable to assess the patient on the day of discharge, due to medical emergency and transfer to the emergency department.   Psychiatric Specialty Exam -  MSE LISTED BELOW WAS THE MOST RECENT MSE, AS THE RESIDENT NOR MD WERE ABLE TO ASSESS PATIENT PRIOR TO HER TRANSFER/DISCHARGE TO THE EMERGENCY DEPARTMENT.   Presentation  General Appearance: Appropriate for Environment; Casual  Eye Contact:Fair  Speech:Clear and Coherent; Normal Rate  Speech Volume:Normal  Handedness:Right   Mood and Affect  Mood:Euthymic  Duration of  Depression Symptoms: No data recorded Affect:Appropriate; Congruent   Thought Process  Thought Processes:Coherent; Linear  Descriptions of Associations:Intact  Orientation:Full (Time, Place and Person)  Thought Content:Logical  History of Schizophrenia/Schizoaffective disorder:No data recorded Duration of Psychotic Symptoms:No data recorded Hallucinations:Hallucinations: None  Ideas of Reference:None  Suicidal Thoughts:Suicidal Thoughts: Yes, Passive SI Passive Intent and/or Plan: With Intent; With Plan; With Means to [place]; With Access to Means  Homicidal Thoughts:Homicidal Thoughts: No   Sensorium  Memory:Immediate Fair; Recent Good; Remote Good  Judgment:Fair  Insight:Fair   Executive Functions  Concentration:Fair  Attention Span:Fair  [name]   Psychomotor Activity  Psychomotor Activity:Psychomotor Activity: Normal   Assets  Assets:Communication Skills; Financial Resources/Insurance; Desire for Improvement; Transportation; Physical Health; Resilience; Housing; Leisure Time; Social Support   Sleep  Sleep:Sleep: Fair   Physical Exam: Physical Exam PT WAS TRANSFERRED TO THE EMERGENCY DEPARTMENT PRIOR TO PHYSICAL EXAM ASSESSMENT   ROS PT WAS TRANSFERRED TO THE EMERGENCY DEPARTMENT PRIOR TO ROS ASSESSMENT  Blood pressure (!) 142/86, pulse 99, temperature 98.4 F (36.9 C), temperature source Oral, resp. rate 16, height 5' 4.17" (1.63 m), weight 67.6 kg, last menstrual period 12/21/2016, SpO2 100 %. Body mass index is 25.44 kg/m.  Mental Status Per Nursing Assessment::   On Admission:  Self-harm thoughts  Demographic factors:  Divorced or widowed, Living alone Loss Factors:  Decline in physical health Historical Factors:  NA Risk Reduction Factors:  Positive social support  Continued Clinical Symptoms:  Severe Anxiety and/or Agitation Depression:   Anhedonia Hopelessness Insomnia Severe Chronic  Pain Medical Diagnoses and Treatments/Surgeries  Cognitive Features That Contribute To Risk:  None    Suicide Risk:  Mild:  Suicidal  ideation of limited frequency, intensity, duration, and specificity, on most recent exam by physician.   [facility] Follow up.   Specialty: Behavioral Health Why: Please go to this provider for therapy and medication management services, during walk in hours:  Monday through Wednesday, from 7:45 am to 11:00 am.  Services are provided on a first come, first served basis (please arrive early)  * Services unavailable 05/05/21 to 05/09/21. Contact information: [place] [facility]. Go to.   Why: Meets 3rd Tuesday of the month from 6:30-8:00 pm via Zoom. Contact information: Phone:  [phone]. Email: [email or web address]                Plan Of Care/Follow-up recommendations:  Other:   transferred to the [facility] long emergency department for medical evaluation  Christoper Allegra, MD 05/07/2021, 11:07 AM

## 2021-05-07 NOTE — Progress Notes (Signed)
    OVERNIGHT PROGRESS REPORT  Notified of multiple patient refusals to take PO potassium.  Alternate route accepted and ordered.  Gershon Cull MSNA MSN [id number]-AG Acute Care Nurse Practitioner [place]

## 2021-05-08 ENCOUNTER — Telehealth: Payer: Self-pay

## 2021-05-08 DIAGNOSIS — D709 Neutropenia, unspecified: Secondary | ICD-10-CM | POA: Diagnosis not present

## 2021-05-08 DIAGNOSIS — E878 Other disorders of electrolyte and fluid balance, not elsewhere classified: Secondary | ICD-10-CM

## 2021-05-08 DIAGNOSIS — C50211 Malignant neoplasm of upper-inner quadrant of right female breast: Secondary | ICD-10-CM | POA: Diagnosis not present

## 2021-05-08 DIAGNOSIS — R197 Diarrhea, unspecified: Secondary | ICD-10-CM | POA: Diagnosis not present

## 2021-05-08 DIAGNOSIS — R5081 Fever presenting with conditions classified elsewhere: Secondary | ICD-10-CM | POA: Diagnosis not present

## 2021-05-08 DIAGNOSIS — E876 Hypokalemia: Secondary | ICD-10-CM | POA: Diagnosis not present

## 2021-05-08 LAB — CBC WITH DIFFERENTIAL/PLATELET
Abs Immature Granulocytes: 0.9 10*3/uL — ABNORMAL HIGH (ref 0.00–0.07)
Band Neutrophils: 8 %
Basophils Absolute: 0.1 10*3/uL (ref 0.0–0.1)
Basophils Relative: 2 %
Eosinophils Absolute: 0.1 10*3/uL (ref 0.0–0.5)
Eosinophils Relative: 1 %
HCT: 27.4 % — ABNORMAL LOW (ref 36.0–46.0)
Hemoglobin: 9 g/dL — ABNORMAL LOW (ref 12.0–15.0)
Lymphocytes Relative: 7 %
Lymphs Abs: 0.4 10*3/uL — ABNORMAL LOW (ref 0.7–4.0)
MCH: 33.3 pg (ref 26.0–34.0)
MCHC: 32.8 g/dL (ref 30.0–36.0)
MCV: 101.5 fL — ABNORMAL HIGH (ref 80.0–100.0)
Metamyelocytes Relative: 2 %
Monocytes Absolute: 0.6 10*3/uL (ref 0.1–1.0)
Monocytes Relative: 10 %
Myelocytes: 13 %
Neutro Abs: 3.5 10*3/uL (ref 1.7–7.7)
Neutrophils Relative %: 56 %
Platelets: 172 10*3/uL (ref 150–400)
Promyelocytes Relative: 1 %
RBC: 2.7 MIL/uL — ABNORMAL LOW (ref 3.87–5.11)
RDW: 22.7 % — ABNORMAL HIGH (ref 11.5–15.5)
WBC: 5.5 10*3/uL (ref 4.0–10.5)
nRBC: 0.6 % — ABNORMAL HIGH (ref 0.0–0.2)

## 2021-05-08 LAB — HEPATIC FUNCTION PANEL
ALT: 18 U/L (ref 0–44)
AST: 15 U/L (ref 15–41)
Albumin: 2.1 g/dL — ABNORMAL LOW (ref 3.5–5.0)
Alkaline Phosphatase: 110 U/L (ref 38–126)
Bilirubin, Direct: 0.2 mg/dL (ref 0.0–0.2)
Indirect Bilirubin: 0.6 mg/dL (ref 0.3–0.9)
Total Bilirubin: 0.8 mg/dL (ref 0.3–1.2)
Total Protein: 4.4 g/dL — ABNORMAL LOW (ref 6.5–8.1)

## 2021-05-08 LAB — PHOSPHORUS: Phosphorus: <1 mg/dL — CL (ref 2.5–4.6)

## 2021-05-08 LAB — BASIC METABOLIC PANEL
Anion gap: 5 (ref 5–15)
BUN: <5 mg/dL — ABNORMAL LOW (ref 6–20)
CO2: 24 mmol/L (ref 22–32)
Calcium: 6.5 mg/dL — ABNORMAL LOW (ref 8.9–10.3)
Chloride: 109 mmol/L (ref 98–111)
Creatinine, Ser: <0.3 mg/dL — ABNORMAL LOW (ref 0.44–1.00)
Glucose, Bld: 100 mg/dL — ABNORMAL HIGH (ref 70–99)
Potassium: 3 mmol/L — ABNORMAL LOW (ref 3.5–5.1)
Sodium: 138 mmol/L (ref 135–145)

## 2021-05-08 LAB — MAGNESIUM: Magnesium: 2 mg/dL (ref 1.7–2.4)

## 2021-05-08 MED ORDER — POTASSIUM & SODIUM PHOSPHATES 280-160-250 MG PO PACK
1.0000 | PACK | Freq: Three times a day (TID) | ORAL | Status: DC
  Filled 2021-05-08: qty 1

## 2021-05-08 MED ORDER — CALCIUM GLUCONATE-NACL 2-0.675 GM/100ML-% IV SOLN
2.0000 g | Freq: Once | INTRAVENOUS | Status: AC
  Administered 2021-05-08: 2000 mg via INTRAVENOUS
  Filled 2021-05-08 (×2): qty 100

## 2021-05-08 MED ORDER — POLYVINYL ALCOHOL 1.4 % OP SOLN
1.0000 [drp] | OPHTHALMIC | Status: DC | PRN
  Filled 2021-05-08: qty 15

## 2021-05-08 MED ORDER — POTASSIUM PHOSPHATES 15 MMOLE/5ML IV SOLN
30.0000 mmol | Freq: Once | INTRAVENOUS | Status: AC
  Administered 2021-05-08: 30 mmol via INTRAVENOUS
  Filled 2021-05-08: qty 10

## 2021-05-08 MED ORDER — KETOTIFEN FUMARATE 0.025 % OP SOLN
1.0000 [drp] | Freq: Two times a day (BID) | OPHTHALMIC | Status: DC
  Administered 2021-05-08 – 2021-05-11 (×6): 1 [drp] via OPHTHALMIC
  Filled 2021-05-08: qty 5

## 2021-05-08 MED ORDER — SODIUM CHLORIDE 0.9 % IV SOLN: Freq: Once | INTRAVENOUS | Status: DC

## 2021-05-08 NOTE — Progress Notes (Signed)
Pharmacy consulted to replenish electrolytes  K 3.0, slightly increased with supplementation yesterday Ca 6.5, minimal improvement after 1g Ca gluconate yesterday Mag 2.0; stable WNL Phos < 1.0; remains low likely d/t refeeding Currently on NS + 40 mEq KCl at 75 ml/hr (~75 mEq/day)  Plan KPhos 30 mmol (45 mEq potassium) IV x 1 Ca gluconate 2g IV x 1 Repeat Cmet, Phos in AM  Reuel Boom, PharmD, BCPS [phone] 05/08/2021, 11:02 AM

## 2021-05-08 NOTE — Progress Notes (Signed)
   05/08/21 1045  Notify: Provider  Provider Name/Title Dr. Prince Rome  Date Provider Notified 05/08/21  Time Provider Notified 1040  Notification Type Page  Notification Reason Critical result  Provider response Other (Comment) (Consistent with previous results. Pharmacy made aware. Orders placed)

## 2021-05-08 NOTE — Progress Notes (Signed)
ADDENDUM:  Monica [name]'s diarrhea is much better but not resolved; she still had one accident today. I have encouraged her to eat many meals during the dayand to concentrate on carbs initially since those are easier to digest. She is no longer on suicide precautions. Pharmacy is helping correct her electrolytes.  I will be unavailable for the next 3  days. I am hopeful [name] will be ready to be discharged by this weekend. If not I will follow up on Monday.  She should continue of questran, lorazepam, remeron and zoloft at home. We have contacted oncology near her home in [place] and they are arranging for an appt late next week.  Greatly appreciate everyone's help to this patient andher family

## 2021-05-08 NOTE — Progress Notes (Signed)
Monica [name]   DOB:25-Aug-1970   EG#:[id number]   VPX#:[id number]  Subjective:  Monica [name] feels "great" this AM. She finally went to sleep about 1 AM and slept through to about 7 AM. She has not had diarrhea for the past 8 hours or so. She is afraid to eat anything worried the diarrhea might recur. LPN in room. No family in room  Objective:  Vitals:   05/07/21 2048 05/08/21 0554  BP: 113/73 122/86  Pulse: 97 95  Resp: 18 16  Temp: 99.9 F (37.7 C) 99.5 F (37.5 C)  SpO2: 97% 96%    Body mass index is 24.27 kg/m.  Intake/Output Summary (Last 24 hours) at 05/08/2021 0814 Last data filed at 05/08/2021 0315 Gross per 24 hour  Intake 984.88 ml  Output 2350 ml  Net -1365.12 ml     Sclerae unicteric  Lungs no rales or wheezes--auscultated anterolaterally  Heart regular rate and rhythm  Abdomen soft, +BS  Neuro nonfocal; she is alert, calm, cooperative this AM   CBG (last 3)  No results for input(s): GLUCAP in the last 72 hours.   Labs:  Lab Results  Component Value Date   WBC 5.5 05/08/2021   HGB 9.0 (L) 05/08/2021   HCT 27.4 (L) 05/08/2021   MCV 101.5 (H) 05/08/2021   PLT 172 05/08/2021   NEUTROABS 3.5 05/08/2021    '@LASTCHEMISTRY' @  Urine Studies No results for input(s): UHGB, CRYS in the last 72 hours.  Invalid input(s): UACOL, UAPR, USPG, UPH, UTP, UGL, UKET, UBIL, UNIT, UROB, ULEU, UEPI, UWBC, URBC, [place], [place], [place], [place]  Basic Metabolic Panel: Recent Labs  Lab 05/01/21 1116 05/02/21 1848 05/05/21 0658 05/05/21 1150 05/06/21 0530 05/07/21 0503 05/07/21 1738 05/07/21 1745 05/08/21 0553  NA 132*   < > 133*  --  136 135 139  --  138  K 3.5   < > 3.1*  --  3.0* 2.6* 2.6*  --  3.0*  CL 102   < > 101  --  105 107 110  --  109  CO2 21*   < > 26  --  24 21* 23  --  24  GLUCOSE 96   < > 155*  --  121* 109* 109*  --  100*  BUN 18   < > 7  --  '13 14 10  ' --  <5*  CREATININE 0.56   < > 0.45  --  0.53 0.45 0.46  --  <0.30*  CALCIUM 7.6*   < > 7.0*  --  6.3* 6.0*  6.4*  --  6.5*  MG 2.1  --   --  1.9  --   --   --   --   --   PHOS 2.3*  --   --   --   --   --   --  <1.0*  --    < > = values in this interval not displayed.   GFR CrCl cannot be calculated (This lab value cannot be used to calculate CrCl because it is not a number: <0.30). Liver Function Tests: Recent Labs  Lab 05/01/21 1116 05/02/21 1848 05/06/21 0530  AST 34 31 21  ALT 53* 49* 26  ALKPHOS 123 141* 125  BILITOT 1.0 1.0 1.3*  PROT 6.0* 6.4* 5.4*  ALBUMIN 3.3* 3.6 2.5*   No results for input(s): LIPASE, AMYLASE in the last 168 hours. No results for input(s): AMMONIA in the last 168 hours. Coagulation profile No results for input(s): INR, PROTIME  in the last 168 hours.  CBC: Recent Labs  Lab 05/03/21 0636 05/03/21 0643 05/03/21 2223 05/05/21 0658 05/06/21 0530 05/07/21 0503 05/08/21 0553  WBC  --    < > 1.3* 0.8* 1.6* 3.7* 5.5  NEUTROABS 0.9*  --   --  0.3* 1.3* 2.5 3.5  HGB  --    < > 10.3* 10.2* 10.1* 9.3* 9.0*  HCT  --    < > 31.5* 31.1* 30.6* 28.5* 27.4*  MCV  --    < > 101.6* 103.3* 100.3* 101.1* 101.5*  PLT  --    < > 130* 131* 145* 157 172   < > = values in this interval not displayed.   Cardiac Enzymes: No results for input(s): CKTOTAL, CKMB, CKMBINDEX, TROPONINI in the last 168 hours. BNP: Invalid input(s): POCBNP CBG: No results for input(s): GLUCAP in the last 168 hours. D-Dimer No results for input(s): DDIMER in the last 72 hours. Hgb A1c No results for input(s): HGBA1C in the last 72 hours. Lipid Profile No results for input(s): CHOL, HDL, LDLCALC, TRIG, CHOLHDL, LDLDIRECT in the last 72 hours. Thyroid function studies No results for input(s): TSH, T4TOTAL, T3FREE, THYROIDAB in the last 72 hours.  Invalid input(s): FREET3 Anemia work up No results for input(s): VITAMINB12, FOLATE, FERRITIN, TIBC, IRON, RETICCTPCT in the last 72 hours. Microbiology Recent Results (from the past 240 hour(s))  Resp Panel by RT-PCR (Flu A&B, Covid)  Nasopharyngeal Swab     Status: None   Collection Time: 05/01/21 10:39 AM   Specimen: Nasopharyngeal Swab; Nasopharyngeal(NP) swabs in vial transport medium  Result Value Ref Range Status   SARS Coronavirus 2 by RT PCR NEGATIVE NEGATIVE Final    Comment: (NOTE) SARS-CoV-2 target nucleic acids are NOT DETECTED.  The SARS-CoV-2 RNA is generally detectable in upper respiratory specimens during the acute phase of infection. The lowest concentration of SARS-CoV-2 viral copies this assay can detect is 138 copies/mL. A negative result does not preclude SARS-Cov-2 infection and should not be used as the sole basis for treatment or other patient management decisions. A negative result may occur with  improper specimen collection/handling, submission of specimen other than nasopharyngeal swab, presence of viral mutation(s) within the areas targeted by this assay, and inadequate number of viral copies(<138 copies/mL). A negative result must be combined with clinical observations, patient history, and epidemiological information. The expected result is Negative.  Fact Sheet for Patients:  EntrepreneurPulse.com.au  Fact Sheet for Healthcare Providers:  IncredibleEmployment.be  This test is no t yet approved or cleared by the [place] FDA and  has been authorized for detection and/or diagnosis of SARS-CoV-2 by FDA under an Emergency Use Authorization (EUA). This EUA will remain  in effect (meaning this test can be used) for the duration of the COVID-19 declaration under Section 564(b)(1) of the Act, 21 U.S.C.section 360bbb-3(b)(1), unless the authorization is terminated  or revoked sooner.       Influenza A by PCR NEGATIVE NEGATIVE Final   Influenza B by PCR NEGATIVE NEGATIVE Final    Comment: (NOTE) The Xpert Xpress SARS-CoV-2/FLU/RSV plus assay is intended as an aid in the diagnosis of influenza from Nasopharyngeal swab specimens and should not be  used as a sole basis for treatment. Nasal washings and aspirates are unacceptable for Xpert Xpress SARS-CoV-2/FLU/RSV testing.  Fact Sheet for Patients: EntrepreneurPulse.com.au  Fact Sheet for Healthcare Providers: IncredibleEmployment.be  This test is not yet approved or cleared by the [place] FDA and has been authorized for detection  and/or diagnosis of SARS-CoV-2 by FDA under an Emergency Use Authorization (EUA). This EUA will remain in effect (meaning this test can be used) for the duration of the COVID-19 declaration under Section 564(b)(1) of the Act, 21 U.S.C. section 360bbb-3(b)(1), unless the authorization is terminated or revoked.  Performed at [facility], [place] [address] [place], [place] [address]   Blood culture (routine x 2)     Status: None (Preliminary result)   Collection Time: 05/05/21  9:55 AM   Specimen: BLOOD  Result Value Ref Range Status   Specimen Description   Final    BLOOD RIGHT ANTECUBITAL Performed at [facility] [address] [place], [place] [address]    Special Requests   Final    BOTTLES DRAWN AEROBIC AND ANAEROBIC Blood Culture results may not be optimal due to an inadequate volume of blood received in culture bottles Performed at [facility] [address] [place], [place] [address]    Culture   Final    NO GROWTH 3 DAYS Performed at [facility], [place] [address], [place], [place] [address]    Report Status PENDING  Incomplete  Blood culture (routine x 2)     Status: None (Preliminary result)   Collection Time: 05/05/21 10:42 AM   Specimen: BLOOD  Result Value Ref Range Status   Specimen Description   Final    BLOOD LEFT ANTECUBITAL Performed at [facility] [address], [place], [place] [address]    Special Requests   Final    BOTTLES DRAWN AEROBIC AND ANAEROBIC Blood Culture adequate volume Performed  at [facility] [address], [place], [place] [address]    Culture   Final    NO GROWTH 3 DAYS Performed at [facility], [place] [address] [place], [place] [address]    Report Status PENDING  Incomplete  Resp Panel by RT-PCR (Flu A&B, Covid) Nasopharyngeal Swab     Status: None   Collection Time: 05/05/21  1:46 PM   Specimen: Nasopharyngeal Swab; Nasopharyngeal(NP) swabs in vial transport medium  Result Value Ref Range Status   SARS Coronavirus 2 by RT PCR NEGATIVE NEGATIVE Final    Comment: (NOTE) SARS-CoV-2 target nucleic acids are NOT DETECTED.  The SARS-CoV-2 RNA is generally detectable in upper respiratory specimens during the acute phase of infection. The lowest concentration of SARS-CoV-2 viral copies this assay can detect is 138 copies/mL. A negative result does not preclude SARS-Cov-2 infection and should not be used as the sole basis for treatment or other patient management decisions. A negative result may occur with  improper specimen collection/handling, submission of specimen other than nasopharyngeal swab, presence of viral mutation(s) within the areas targeted by this assay, and inadequate number of viral copies(<138 copies/mL). A negative result must be combined with clinical observations, patient history, and epidemiological information. The expected result is Negative.  Fact Sheet for Patients:  EntrepreneurPulse.com.au  Fact Sheet for Healthcare Providers:  IncredibleEmployment.be  This test is no t yet approved or cleared by the [place] FDA and  has been authorized for detection and/or diagnosis of SARS-CoV-2 by FDA under an Emergency Use Authorization (EUA). This EUA will remain  in effect (meaning this test can be used) for the duration of the COVID-19 declaration under Section 564(b)(1) of the Act, 21 U.S.C.section 360bbb-3(b)(1), unless the authorization is terminated  or revoked  sooner.       Influenza A by PCR  NEGATIVE NEGATIVE Final   Influenza B by PCR NEGATIVE NEGATIVE Final    Comment: (NOTE) The Xpert Xpress SARS-CoV-2/FLU/RSV plus assay is intended as an aid in the diagnosis of influenza from Nasopharyngeal swab specimens and should not be used as a sole basis for treatment. Nasal washings and aspirates are unacceptable for Xpert Xpress SARS-CoV-2/FLU/RSV testing.  Fact Sheet for Patients: EntrepreneurPulse.com.au  Fact Sheet for Healthcare Providers: IncredibleEmployment.be  This test is not yet approved or cleared by the [place] FDA and has been authorized for detection and/or diagnosis of SARS-CoV-2 by FDA under an Emergency Use Authorization (EUA). This EUA will remain in effect (meaning this test can be used) for the duration of the COVID-19 declaration under Section 564(b)(1) of the Act, 21 U.S.C. section 360bbb-3(b)(1), unless the authorization is terminated or revoked.  Performed at [facility], [place] [address], [place], [place] [address]   MRSA Next Gen by PCR, Nasal     Status: None   Collection Time: 05/05/21  4:14 PM   Specimen: Nasal Mucosa; Nasal Swab  Result Value Ref Range Status   MRSA by PCR Next Gen NOT DETECTED NOT DETECTED Final    Comment: (NOTE) The GeneXpert MRSA Assay (FDA approved for NASAL specimens only), is one component of a comprehensive MRSA colonization surveillance program. It is not intended to diagnose MRSA infection nor to guide or monitor treatment for MRSA infections. Test performance is not FDA approved in patients less than 56 years old. Performed at [facility], [place] [address] [place], [place] [address]   C Difficile Quick Screen w PCR reflex     Status: None   Collection Time: 05/06/21 10:10 AM   Specimen: STOOL  Result Value Ref Range Status   C Diff antigen NEGATIVE NEGATIVE Final   C Diff toxin NEGATIVE  NEGATIVE Final   C Diff interpretation No C. difficile detected.  Final    Comment: Performed at [facility], [place] [address], [place], [place] [address]      Studies:  MR BRAIN W WO CONTRAST  Result Date: 05/07/2021 CLINICAL DATA:  Breast cancer, assess treatment response; follow brain metastases. EXAM: MRI HEAD WITHOUT AND WITH CONTRAST TECHNIQUE: Multiplanar, multiecho pulse sequences of the brain and surrounding structures were obtained without and with intravenous contrast. CONTRAST:  61m GADAVIST GADOBUTROL 1 MMOL/ML IV SOLN COMPARISON:  MRI of the brain March 06, 2021. FINDINGS: Brain: No acute infarction, hemorrhage, hydrocephalus or extra-axial collection. Postsurgical changes from right temporal lobe lesion resection with residual marginal contrast enhancement in the surgical cavity, stable to mildly decreased from prior MRI. Three enhancing parenchymal lesions have decreased in size when compared to prior MRI: * Right pre frontal soft gyrus (series 16, image 131), 4 mm, compared to 6 mm on prior. * Left superior frontal gyrus (series 18, image 15), 2 mm, compared to 3 mm on prior. * Left temporal lobe (series 16, image 57) 2 mm, compared to 4 mm on prior. A right occipital lobe (series 16, image 92; series 18, image 8), has similar size when compared to prior MRI, measuring 10 x 6 mm but with decreased degree of contrast enhancement. A left cerebellar hemisphere lesion has grown from 2 mm to 4 mm in the current study (series 16, image 37). Questionable new punctate focus of contrast enhancement in the left frontal lobe in the anterior orbital gyrus (series 16, image 79). Vascular: Normal flow voids. Skull and upper cervical spine: Status post right  temporal craniotomy. Interval increase in size of the osseous lesions involving the bilateral frontal bones, left parietal bone, left mastoid process, left lateral aspect of the C1 anterior arch and left mandibular condyle.  Diffuse decreased T1 signal of the visualized upper cervical spine with contrast enhancement, consistent with metastatic disease appear similar. Sinuses/Orbits: Negative. Other: None. IMPRESSION: 1. Mixed interval response of parenchymal lesions with interval decrease in size of 3 supratentorial lesions, 1 stable supratentorial lesion and interval growth of 1 cerebellar lesion with questionable punctate new lesion in the left frontal lobe. 2. Interval progression of the osseous metastatic lesions, as described above. Electronically Signed   By: Pedro Earls M.D.   On: 05/07/2021 13:56    Assessment: 50 y.o.  Monica [name], Monica [name] woman status post right breast upper inner quadrant biopsy 12/29/2017, for a clinical T1c N0, stage IA invasive ductal carcinoma, triple positive, with an MIB-1 of 80%.   (1) genetics testing 02/02/2018 though the CancerNext gene panel offered by [facility] showed no deleterious mutations in  APC, ATM, BARD1, BMPR1A,BRCA1, BRCA2, BRIP1, CDH1, CDK4, CDKN2A, CHEK2, DICER1, HOXB13, MLH1, MRE11A, MSH2, MSH6, MUTYH, NBN, NF1, PALB2, PMS2, POLD1, POLE, PTEN, RAD50, RAD51C, RAD51D, SMAD4, SMARCA4, STK11 and TP53 (sequencing and deletion/duplication); EPCAM and GREM1 (deletion/duplication only).             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               (a) Pertuzumab omitted after cycle 1 due to diarrhea.             (b) Gemcitabine substituted for docetaxel starting with cycle 5 due to peripheral neuropathy   (4) continued trastuzumab to  total 6 months (through February 2020).             (a) echocardiogram 01/21/2018 showed an ejection fraction in the 55-60% range             (b) repeat echocardiogram 06/14/2018 showed an ejection fraction in the 60-65%   (5) adjuvant radiation 08/15/2018 - 09/28/2018  Site/dose:   The patient initially received a dose of 50.4 Gy in 28 fractions to the right breast using whole-breast tangent fields. This was delivered using a 3-D conformal technique. The patient then received a boost to the seroma. This delivered an additional 10 Gy in 5 fractions using 12E, 9E electrons with a special teletherapy technique. The total dose was 60.4 Gy.    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, involving brain, liver, lungs, and bones (7) presenting 11/29/2020 with progressive back pain:             (a)  non-contrast CT abd/pelvis (renal stone study) 11/29/2020 shows bone metastases             (b) CT chest/abd/pelvis with contrast 11/30/2020 shows multiple large liver masses, multiple pulmonary nodules, and widespread lytic bone lesions             (c) MRI brain shows at least 4 brain lesions, largest 3.8 cm             (d) total spinal MRI 11/30/2020 shows pathologic fractures at T9, T12, possibly L2, as well as numerous other spine lesions, but no epidural enhancement or cord compression             (e) liver biopsy 12/02/2020 shows adenocarcinoma, prognostic panel again triple positive             (f) pre-op repeat MRI 12/01/2020 shows additional brain lesions, at least 8 of which will be targets for SRS   (8) CNS treatment:             (a) [place] 12/05/2020             (b) surgery 12/06/2020 confirmed metastatic carcinoma, estrogen receptor positive, HER2 amplified,  progesterone receptor negative, with an MIB-1 of 70%.             (c) brain MRI 03/06/2021 shows minimal enhancement along the resection margin, previously noted lesions stable or decreased in size, some no longer seen, and no new brain lesions   (9) XRT to spine 12/05/2020 through 12/23/2020 Site Technique Total Dose (Gy) Dose per Fx (Gy) Completed Fx Beam Energies  Thoracic Spine: Spine_T9-L3 Complex 30/30 3 10/10 15X  Pelvis: Pelvis_sacrum Complex 30/30 3 10/10 15X    (10) advanced directives-- plans to name son as HCPOA with her father as secondary   (48) started capecitabine 01/08/2021, trastuzumab 01/20/2021, tucatinib 02/10/2021             (a) echo 01/14/2021 shows an ejection fraction in the 60-65% range.             (b) capecitabine and trastuzumab discontinued 04/15/2021 with evidence of progression             (c) tucatinib continued   (12) started denosumab/Xgeva 02/10/2021, repeated every 6 weeks             (a) received one dose pamidronate in hospital 01/16/2021   (13) pain management:             (a) Tylenol 500 mg and Advil 400 mg 3 times a day w as needed             (b) gabapentin 3 times a day as needed             (c) tramadol 50 mg 4 times a day as needed              (d) bowel prophylaxis: MiraLAX daily, stool softeners 2 tablets twice daily as needed   (14) status post T9 and T10 kyphoplasty 03/07/2021   (15) Enhertu started 04/23/2021  (16) febrile neutropenia 05/05/2021   Plan:  Chelsye is now day 16 cycle 1 Enhertu. She is no longer neutropenic.  Her fever has resolved.  C. difficile has returned negative.  Blood cultures are negative to date. Antibiotics can be changed to oral at your discretion. Would d/c ABX once blood cultures definitively negative  She has markedly low potassium, magnesium, calcium and phosphorus c/w refeeding syndrome-- she has not been eating for about a  month. After this AM's discussion she is agreeable to try K-phos. I wrote for a  second Mg/Ca infusion this AM and to recheck labs early this PM. Also placed a consult to pharmacy to help up safely replete her electrolytes.  Would continue octreotide and questran as they seem to be working.  The low-dose RTC lorazepam seems to be working well for her-- her mood is considerably improved today. There is no reason not to continue this. We also increased her antidepressant doses yesterday. The mirtazepine in particular should eventually help with appetite and sleep problems.  Once diarrhea and electrolyte problems are resolved and psychiatry has released her monitoring to her patrents she will be ready for d/c to [place] under her parents care. We are arranging for a visit with oncologu locally [facility]) next week to maintain continuity of care.  Will follow with you    Chauncey Cruel, MD 05/08/2021  8:14 AM Medical Oncology and Hematology [facility] [address] [place], [place] [address] Tel. [phone]    Fax. [phone]

## 2021-05-08 NOTE — Telephone Encounter (Signed)
Spoke with Janett Billow, RN from [facility] in [place] who states pt's [place] Medicaid must be D/C before pt can obtain medicaid in [place], otherwise since pt has no other insurance, she would have to pay OOP for initial visit in [place]. Pt's records faxed to Janett Billow directly at [phone] to ensure they were received, fax confirmation received. Janett Billow knows to call with any concerns or if she needs more information. Janett Billow states once she can get records, she can make appt for pt and knows to cal pt's mother. A direct number to reach the nurse navigators at [facility] is [phone]

## 2021-05-08 NOTE — Progress Notes (Signed)
PROGRESS NOTE  Monica [name]    DOB: 03-05-1971, 50 y.o.  QQV:[id number]  PCP: Beverley Fiedler, FNP   Code Status: Full Code   DOA: 05/05/2021   LOS: 3  Brief Narrative of Current Hospitalization  Monica [name] is a 50 y.o. female with a PMH significant for osteoarthritis, depression, metastatic breast cancer, recent behavioral health admission for suicidal ideations. They presented from behavioral health hospital to the ED on 05/05/2021 with abdominal pain, nausea, vomiting x 1 days. In the ED, it was found that they had a fever with low white blood cell count. They were treated with IV antibiotics, IV fluids, Tylenol.  Heme/onc was consulted.  Patient was admitted to medicine service for further workup and management of neutropenic fever as outlined in detail below.  05/08/21 - improved  Assessment & Plan  Principal Problem:   Febrile neutropenia (HCC) Active Problems:   Malignant neoplasm of upper-inner quadrant of right breast in female, estrogen receptor positive (HCC)   Hypokalemia   Suicidal ideation   Major depressive disorder   Diarrhea   Malnutrition of moderate degree  Febrile neutropenia-in setting of malignancy. Resolved. WBC count 5.5 today. She states that she has not yet had diarrhea today. - imodium PRN for diarrhea - heme onc following, appreciate recommendations - discharge planning per heme/onc decision -complete cefepime (10/24-10/27) -As needed antiemetics  Nausea/diarrhea- likely medication SE. Improving. - oncology added octreotide, questran  Hypokalemia- improving. K+ 3.0 today. Likely related to her continued diarrhea. Declined oral tablets. Magnesium 2.0.  - 64mEq TID PO today liquid Kphos to replete low phosphorus as well. - continue IV fluids with 29mEq K+ NS at 65mL/hr  Major depressive order-denies SI -Continue home behavioral health medications including Remeron, sertraline - at increased doses, per psych. 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  -Management per heme-onc -Brain MRI 10/26 -PT/OT evaluation  DVT prophylaxis: enoxaparin (LOVENOX) injection 40 mg Start: 05/05/21 1800  Diet:  Diet Orders (From admission, onward)     Start     Ordered   05/05/21 1534  Diet Heart Room service appropriate? Yes; Fluid consistency: Thin  Diet effective now       Question Answer Comment  Room service appropriate? Yes   Fluid consistency: Thin      05/05/21 1533            Subjective 05/08/21    Pt reports doing great today. No diarrhea and able to tolerate some food. Endorses watery and burning bilaterally. Denies having this problem in the past. No visual changes.  Disposition Plan & Communication  Status is: Inpatient  Remains inpatient appropriate because: ongoing workup   Inpatient To Be Determined  Family Communication: none  Consults, Procedures, Significant Events  Consultants:  Heme/onc psychiatry  Procedures/significant events:  Brain MRI 10/26  Antimicrobials:  Anti-infectives (From admission, onward)    Start     Dose/Rate Route Frequency Ordered Stop   05/06/21 1000  valACYclovir (VALTREX) tablet 1,000 mg       Note to Pharmacy: Take one tablet twice a day for one week, then daily     1,000 mg Oral Daily 05/05/21 1633     05/06/21 1000  fluconazole (DIFLUCAN) tablet 100 mg        100 mg Oral Daily 05/05/21 1633     05/05/21 2000  ceFEPIme (MAXIPIME) 2 g in sodium chloride 0.9 % 100 mL IVPB        2 g 200 mL/hr over 30 Minutes Intravenous Every 8 hours 05/05/21 1533          Objective   Vitals:   05/07/21 0441 05/07/21 1403 05/07/21 2048 05/08/21 0554  BP: 114/77 111/82 113/73 122/86  Pulse: 98 86 97 95  Resp: 16 16 18 16   Temp: 98.3 F (36.8  C) 98.5 F (36.9 C) 99.9 F (37.7 C) 99.5 F (37.5 C)  TempSrc: Oral Oral Oral Oral  SpO2: 96% 99% 97% 96%  Weight:      Height:        Intake/Output Summary (Last 24 hours) at 05/08/2021 0753 Last data filed at 05/08/2021 0315 Gross per 24 hour  Intake 984.88 ml  Output 2350 ml  Net -1365.12 ml    Filed Weights   05/06/21 0248 05/06/21 1558  Weight: 66.7 kg 72.4 kg    Patient BMI: Body mass index is 24.27 kg/m.   Physical Exam: General: awake, alert, NAD HEENT: atraumatic, clear conjunctiva, anicteric sclera, eyes watering, moist mucus membranes, hearing grossly normal Respiratory: normal respiratory effort. Cardiovascular: normal S1/S2,  RRR, no JVD, murmurs, rubs, gallops, quick capillary refill  Nervous: A&O x3. no gross focal neurologic deficits, normal speech Extremities: moves all equally, no edema, normal tone Skin: dry, intact, normal temperature, normal color, No rashes, lesions or ulcers Psychiatry: flat mood, affect  Labs   I have personally reviewed following labs and imaging studies Admission on 05/05/2021  Component Date Value Ref Range Status   MRSA by PCR Next Gen 05/05/2021 NOT DETECTED  NOT DETECTED Final   C Diff antigen 05/06/2021 NEGATIVE  NEGATIVE Final   C Diff toxin 05/06/2021 NEGATIVE  NEGATIVE Final   C Diff interpretation 05/06/2021 No C. difficile detected.   Final   WBC 05/06/2021 1.6 (A)  4.0 - 10.5 K/uL Final   RBC 05/06/2021 3.05 (A)  3.87 - 5.11 MIL/uL Final   Hemoglobin 05/06/2021 10.1 (A)  12.0 - 15.0 g/dL Final   HCT 05/06/2021 30.6 (A)  36.0 - 46.0 % Final   MCV 05/06/2021 100.3 (A)  80.0 - 100.0 fL Final   MCH 05/06/2021 33.1  26.0 - 34.0 pg Final   MCHC 05/06/2021 33.0  30.0 - 36.0 g/dL Final   RDW 05/06/2021 22.4 (A)  11.5 - 15.5 % Final   Platelets 05/06/2021 145 (A)  150 - 400 K/uL Final   nRBC 05/06/2021 1.3 (A)  0.0 - 0.2 % Final   Neutrophils Relative % 05/06/2021 75  % Final   Neutro Abs 05/06/2021 1.3 (A)  1.7 - 7.7  K/uL Final   Band Neutrophils 05/06/2021 4  % Final   Lymphocytes Relative 05/06/2021 9  % Final   Lymphs Abs 05/06/2021 0.1 (A)  0.7 - 4.0 K/uL Final   Monocytes Relative 05/06/2021 4  % Final   Monocytes Absolute 05/06/2021 0.1  0.1 - 1.0 K/uL Final   Eosinophils Relative 05/06/2021 2  % Final   Eosinophils Absolute 05/06/2021 0.0  0.0 - 0.5 K/uL Final   Basophils Relative 05/06/2021 0  % Final   Basophils Absolute 05/06/2021 0.0  0.0 - 0.1 K/uL Final   WBC Morphology 05/06/2021 DOHLE BODIES   Final   Myelocytes 05/06/2021 6  % Final   Abs Immature Granulocytes 05/06/2021 0.10 (A)  0.00 - 0.07 K/uL Final   Polychromasia 05/06/2021 PRESENT   Final   Ovalocytes  05/06/2021 PRESENT   Final   Sodium 05/06/2021 136  135 - 145 mmol/L Final   Potassium 05/06/2021 3.0 (A)  3.5 - 5.1 mmol/L Final   Chloride 05/06/2021 105  98 - 111 mmol/L Final   CO2 05/06/2021 24  22 - 32 mmol/L Final   Glucose, Bld 05/06/2021 121 (A)  70 - 99 mg/dL Final   BUN 05/06/2021 13  6 - 20 mg/dL Final   Creatinine, Ser 05/06/2021 0.53  0.44 - 1.00 mg/dL Final   Calcium 05/06/2021 6.3 (A)  8.9 - 10.3 mg/dL Final   Total Protein 05/06/2021 5.4 (A)  6.5 - 8.1 g/dL Final   Albumin 05/06/2021 2.5 (A)  3.5 - 5.0 g/dL Final   AST 05/06/2021 21  15 - 41 U/L Final   ALT 05/06/2021 26  0 - 44 U/L Final   Alkaline Phosphatase 05/06/2021 125  38 - 126 U/L Final   Total Bilirubin 05/06/2021 1.3 (A)  0.3 - 1.2 mg/dL Final   GFR, Estimated 05/06/2021 >60  >60 mL/min Final   Anion gap 05/06/2021 7  5 - 15 Final   WBC 05/07/2021 3.7 (A)  4.0 - 10.5 K/uL Final   RBC 05/07/2021 2.82 (A)  3.87 - 5.11 MIL/uL Final   Hemoglobin 05/07/2021 9.3 (A)  12.0 - 15.0 g/dL Final   HCT 05/07/2021 28.5 (A)  36.0 - 46.0 % Final   MCV 05/07/2021 101.1 (A)  80.0 - 100.0 fL Final   MCH 05/07/2021 33.0  26.0 - 34.0 pg Final   MCHC 05/07/2021 32.6  30.0 - 36.0 g/dL Final   RDW 05/07/2021 22.5 (A)  11.5 - 15.5 % Final   Platelets 05/07/2021 157   150 - 400 K/uL Final   nRBC 05/07/2021 0.8 (A)  0.0 - 0.2 % Final   Neutrophils Relative % 05/07/2021 68  % Final   Neutro Abs 05/07/2021 2.5  1.7 - 7.7 K/uL Final   Lymphocytes Relative 05/07/2021 6  % Final   Lymphs Abs 05/07/2021 0.2 (A)  0.7 - 4.0 K/uL Final   Monocytes Relative 05/07/2021 15  % Final   Monocytes Absolute 05/07/2021 0.6  0.1 - 1.0 K/uL Final   Eosinophils Relative 05/07/2021 3  % Final   Eosinophils Absolute 05/07/2021 0.1  0.0 - 0.5 K/uL Final   Basophils Relative 05/07/2021 1  % Final   Basophils Absolute 05/07/2021 0.0  0.0 - 0.1 K/uL Final   WBC Morphology 05/07/2021 MILD LEFT SHIFT (1-5% METAS, OCC MYELO, OCC BANDS)   Final   Immature Granulocytes 05/07/2021 7  % Final   Abs Immature Granulocytes 05/07/2021 0.26 (A)  0.00 - 0.07 K/uL Final   Polychromasia 05/07/2021 PRESENT   Final   Sodium 05/07/2021 135  135 - 145 mmol/L Final   Potassium 05/07/2021 2.6 (A)  3.5 - 5.1 mmol/L Final   Chloride 05/07/2021 107  98 - 111 mmol/L Final   CO2 05/07/2021 21 (A)  22 - 32 mmol/L Final   Glucose, Bld 05/07/2021 109 (A)  70 - 99 mg/dL Final   BUN 05/07/2021 14  6 - 20 mg/dL Final   Creatinine, Ser 05/07/2021 0.45  0.44 - 1.00 mg/dL Final   Calcium 05/07/2021 6.0 (A)  8.9 - 10.3 mg/dL Final   GFR, Estimated 05/07/2021 >60  >60 mL/min Final   Anion gap 05/07/2021 7  5 - 15 Final   Sodium 05/07/2021 139  135 - 145 mmol/L Final   Potassium 05/07/2021 2.6 (A)  3.5 - 5.1  mmol/L Final   Chloride 05/07/2021 110  98 - 111 mmol/L Final   CO2 05/07/2021 23  22 - 32 mmol/L Final   Glucose, Bld 05/07/2021 109 (A)  70 - 99 mg/dL Final   BUN 05/07/2021 10  6 - 20 mg/dL Final   Creatinine, Ser 05/07/2021 0.46  0.44 - 1.00 mg/dL Final   Calcium 05/07/2021 6.4 (A)  8.9 - 10.3 mg/dL Final   GFR, Estimated 05/07/2021 >60  >60 mL/min Final   Anion gap 05/07/2021 6  5 - 15 Final   WBC 05/08/2021 5.5  4.0 - 10.5 K/uL Final   RBC 05/08/2021 2.70 (A)  3.87 - 5.11 MIL/uL Final   Hemoglobin  05/08/2021 9.0 (A)  12.0 - 15.0 g/dL Final   HCT 05/08/2021 27.4 (A)  36.0 - 46.0 % Final   MCV 05/08/2021 101.5 (A)  80.0 - 100.0 fL Final   MCH 05/08/2021 33.3  26.0 - 34.0 pg Final   MCHC 05/08/2021 32.8  30.0 - 36.0 g/dL Final   RDW 05/08/2021 22.7 (A)  11.5 - 15.5 % Final   Platelets 05/08/2021 172  150 - 400 K/uL Final   nRBC 05/08/2021 0.6 (A)  0.0 - 0.2 % Final   Neutrophils Relative % 05/08/2021 56  % Final   Neutro Abs 05/08/2021 3.5  1.7 - 7.7 K/uL Final   Band Neutrophils 05/08/2021 8  % Final   Lymphocytes Relative 05/08/2021 7  % Final   Lymphs Abs 05/08/2021 0.4 (A)  0.7 - 4.0 K/uL Final   Monocytes Relative 05/08/2021 10  % Final   Monocytes Absolute 05/08/2021 0.6  0.1 - 1.0 K/uL Final   Eosinophils Relative 05/08/2021 1  % Final   Eosinophils Absolute 05/08/2021 0.1  0.0 - 0.5 K/uL Final   Basophils Relative 05/08/2021 2  % Final   Basophils Absolute 05/08/2021 0.1  0.0 - 0.1 K/uL Final   WBC Morphology 05/08/2021 MODERATE LEFT SHIFT (>5% METAS AND MYELOS,OCC PRO NOTED)   Final   Metamyelocytes Relative 05/08/2021 2  % Final   Myelocytes 05/08/2021 13  % Final   Promyelocytes Relative 05/08/2021 1  % Final   Abs Immature Granulocytes 05/08/2021 0.90 (A)  0.00 - 0.07 K/uL Final   Polychromasia 05/08/2021 PRESENT   Final   Sodium 05/08/2021 138  135 - 145 mmol/L Final   Potassium 05/08/2021 3.0 (A)  3.5 - 5.1 mmol/L Final   Chloride 05/08/2021 109  98 - 111 mmol/L Final   CO2 05/08/2021 24  22 - 32 mmol/L Final   Glucose, Bld 05/08/2021 100 (A)  70 - 99 mg/dL Final   BUN 05/08/2021 <5 (A)  6 - 20 mg/dL Final   Creatinine, Ser 05/08/2021 <0.30 (A)  0.44 - 1.00 mg/dL Final   Calcium 05/08/2021 6.5 (A)  8.9 - 10.3 mg/dL Final   GFR, Estimated 05/08/2021 NOT CALCULATED  >60 mL/min Final   Anion gap 05/08/2021 5  5 - 15 Final   Phosphorus 05/07/2021 <1.0 (A)  2.5 - 4.6 mg/dL Final    Imaging Studies  MR BRAIN W WO CONTRAST  Result Date: 05/07/2021 CLINICAL DATA:   Breast cancer, assess treatment response; follow brain metastases. EXAM: MRI HEAD WITHOUT AND WITH CONTRAST TECHNIQUE: Multiplanar, multiecho pulse sequences of the brain and surrounding structures were obtained without and with intravenous contrast. CONTRAST:  55mL GADAVIST GADOBUTROL 1 MMOL/ML IV SOLN COMPARISON:  MRI of the brain March 06, 2021. FINDINGS: Brain: No acute infarction, hemorrhage, hydrocephalus or extra-axial collection.  Postsurgical changes from right temporal lobe lesion resection with residual marginal contrast enhancement in the surgical cavity, stable to mildly decreased from prior MRI. Three enhancing parenchymal lesions have decreased in size when compared to prior MRI: * Right pre frontal soft gyrus (series 16, image 131), 4 mm, compared to 6 mm on prior. * Left superior frontal gyrus (series 18, image 15), 2 mm, compared to 3 mm on prior. * Left temporal lobe (series 16, image 57) 2 mm, compared to 4 mm on prior. A right occipital lobe (series 16, image 92; series 18, image 8), has similar size when compared to prior MRI, measuring 10 x 6 mm but with decreased degree of contrast enhancement. A left cerebellar hemisphere lesion has grown from 2 mm to 4 mm in the current study (series 16, image 37). Questionable new punctate focus of contrast enhancement in the left frontal lobe in the anterior orbital gyrus (series 16, image 79). Vascular: Normal flow voids. Skull and upper cervical spine: Status post right temporal craniotomy. Interval increase in size of the osseous lesions involving the bilateral frontal bones, left parietal bone, left mastoid process, left lateral aspect of the C1 anterior arch and left mandibular condyle. Diffuse decreased T1 signal of the visualized upper cervical spine with contrast enhancement, consistent with metastatic disease appear similar. Sinuses/Orbits: Negative. Other: None. IMPRESSION: 1. Mixed interval response of parenchymal lesions with interval decrease  in size of 3 supratentorial lesions, 1 stable supratentorial lesion and interval growth of 1 cerebellar lesion with questionable punctate new lesion in the left frontal lobe. 2. Interval progression of the osseous metastatic lesions, as described above. Electronically Signed   By: Pedro Earls M.D.   On: 05/07/2021 13:56   Medications   Scheduled Meds:  Chlorhexidine Gluconate Cloth  6 each Topical Daily   cholestyramine light  4 g Oral TID   enoxaparin (LOVENOX) injection  40 mg Subcutaneous Q24H   feeding supplement  237 mL Oral TID BM   fluconazole  100 mg Oral Daily   gabapentin  300 mg Oral TID   LORazepam  0.25 mg Oral TID AC & HS   mirtazapine  30 mg Oral QHS   multivitamin with minerals  1 tablet Oral Daily   octreotide  100 mcg Subcutaneous Q12H   ondansetron (ZOFRAN) IV  4 mg Intravenous TID AC & HS   Or   ondansetron  4 mg Oral TID AC & HS   pantoprazole  40 mg Oral Daily   potassium & sodium phosphates  1 packet Oral TID AC & HS   sertraline  50 mg Oral Daily   tucatinib  150 mg Oral BID   valACYclovir  1,000 mg Oral Daily   No recently discontinued medications to reconcile  LOS: 3 days   Time spent: >40min  Richarda Osmond, DO [facility] 05/08/2021, 7:53 AM    Please refer to amion to contact the [facility] Attending or Consulting provider for this pt  [email or web address] Available by Epic secure chat 7AM-7PM. If 7PM-7AM, please contact night-coverage

## 2021-05-08 NOTE — Consult Note (Signed)
Spoke with patient over the phone this afternoon, to review updated plan and schedule a time to meet when parents would be present. Patient expressed concern of having a safety sitter with her 24 hours as she has brain fog and her memory/recollection seems to be off at times. We discussed her concerns and deemed that she continues to be safe and no longer requires 24/7 hour supervision at this time. She continues to deny and refute any current, active or passive suicidal ideations, she is also able to contract. Safety while on the unit.   We have agreed to d/c safety sitter at this time, consult TOC for outpatient referrals in [place], and plan to visit tomorrow around 12pm to have disposition planning with parents and patient.  -Patient is no longer a danger to herself at this time, will d/c safety sitter.  -TOC consult placed to begin referrals for outpatient programming to include Partial Hospitalization Programming if this is available.  -Plan to conduct discharge planning with parents. Patient will be released to her parents care after she is medically stable.  -WIll increase zoloft 50mg  po daily and continue mirtazapine 7.5mg  po qhs to further target her poor appetite, depression and insomnia.   Psychiatry will continue to follow at this time.

## 2021-05-09 DIAGNOSIS — E876 Hypokalemia: Secondary | ICD-10-CM | POA: Diagnosis not present

## 2021-05-09 DIAGNOSIS — D709 Neutropenia, unspecified: Secondary | ICD-10-CM | POA: Diagnosis not present

## 2021-05-09 DIAGNOSIS — F332 Major depressive disorder, recurrent severe without psychotic features: Secondary | ICD-10-CM | POA: Diagnosis not present

## 2021-05-09 DIAGNOSIS — R197 Diarrhea, unspecified: Secondary | ICD-10-CM | POA: Diagnosis not present

## 2021-05-09 LAB — CBC
HCT: 28.3 % — ABNORMAL LOW (ref 36.0–46.0)
Hemoglobin: 9.3 g/dL — ABNORMAL LOW (ref 12.0–15.0)
MCH: 33.5 pg (ref 26.0–34.0)
MCHC: 32.9 g/dL (ref 30.0–36.0)
MCV: 101.8 fL — ABNORMAL HIGH (ref 80.0–100.0)
Platelets: 207 10*3/uL (ref 150–400)
RBC: 2.78 MIL/uL — ABNORMAL LOW (ref 3.87–5.11)
RDW: 23.5 % — ABNORMAL HIGH (ref 11.5–15.5)
WBC: 7.6 10*3/uL (ref 4.0–10.5)
nRBC: 1.5 % — ABNORMAL HIGH (ref 0.0–0.2)

## 2021-05-09 LAB — COMPREHENSIVE METABOLIC PANEL
ALT: 18 U/L (ref 0–44)
AST: 18 U/L (ref 15–41)
Albumin: 2.3 g/dL — ABNORMAL LOW (ref 3.5–5.0)
Alkaline Phosphatase: 145 U/L — ABNORMAL HIGH (ref 38–126)
Anion gap: 5 (ref 5–15)
BUN: <5 mg/dL — ABNORMAL LOW (ref 6–20)
CO2: 26 mmol/L (ref 22–32)
Calcium: 6.7 mg/dL — ABNORMAL LOW (ref 8.9–10.3)
Chloride: 108 mmol/L (ref 98–111)
Creatinine, Ser: 0.45 mg/dL (ref 0.44–1.00)
GFR, Estimated: >60 mL/min (ref >60)
Glucose, Bld: 118 mg/dL — ABNORMAL HIGH (ref 70–99)
Potassium: 4 mmol/L (ref 3.5–5.1)
Sodium: 139 mmol/L (ref 135–145)
Total Bilirubin: 0.7 mg/dL (ref 0.3–1.2)
Total Protein: 4.7 g/dL — ABNORMAL LOW (ref 6.5–8.1)

## 2021-05-09 LAB — PHOSPHORUS: Phosphorus: <1 mg/dL — CL (ref 2.5–4.6)

## 2021-05-09 MED ORDER — SODIUM CHLORIDE 0.9% FLUSH
10.0000 mL | Freq: Two times a day (BID) | INTRAVENOUS | Status: DC
  Administered 2021-05-09 – 2021-05-10 (×2): 10 mL

## 2021-05-09 MED ORDER — SODIUM CHLORIDE 0.9% FLUSH: 10.0000 mL | INTRAVENOUS | Status: DC | PRN

## 2021-05-09 MED ORDER — CALCIUM GLUCONATE-NACL 2-0.675 GM/100ML-% IV SOLN
2.0000 g | Freq: Once | INTRAVENOUS | Status: AC
  Administered 2021-05-09: 2000 mg via INTRAVENOUS
  Filled 2021-05-09: qty 100

## 2021-05-09 MED ORDER — MIRTAZAPINE 30 MG PO TABS: 30.0000 mg | ORAL_TABLET | Freq: Every day | ORAL | 1 refills | Status: DC

## 2021-05-09 MED ORDER — SODIUM PHOSPHATES 45 MMOLE/15ML IV SOLN
30.0000 mmol | Freq: Two times a day (BID) | INTRAVENOUS | Status: AC
  Administered 2021-05-09 (×2): 30 mmol via INTRAVENOUS
  Filled 2021-05-09 (×2): qty 10

## 2021-05-09 MED ORDER — SERTRALINE HCL 50 MG PO TABS: 50.0000 mg | ORAL_TABLET | Freq: Every day | ORAL | 1 refills | Status: DC

## 2021-05-09 NOTE — Progress Notes (Signed)
   05/09/21 0845  Provider Notification  Provider Name/Title Dr. Prince Rome  Date Provider Notified 05/09/21  Time Provider Notified [phone]  Notification Type Page  Notification Reason Critical result  Test performed and critical result Phos <1.0  Date Critical Result Received 05/09/21  Time Critical Result Received 0837  Provider response See new orders

## 2021-05-09 NOTE — Consult Note (Addendum)
[facility] Face-to-Face Psychiatry Consult   Reason for Consult:  Suicidal and Depression Referring Physician: Dr. Ouida Sills Patient Identification: Monica [name] MRN:  [id number] Principal Diagnosis: Febrile neutropenia ([place]) Diagnosis:  Principal Problem:   Febrile neutropenia ([place]) Active Problems:   Malignant neoplasm of upper-inner quadrant of right breast in female, estrogen receptor positive ([place])   Hypokalemia   Suicidal ideation   Major depressive disorder   Diarrhea   Malnutrition of moderate degree   Refeeding syndrome   Total Time spent with patient: 45 minutes  Subjective:   Monica [name] is a 50 y.o. female patient admitted with depression and suicidal ideations.  Patient presents from [facility], with a current diagnosis of febrile neutropenia in which she developed of high fever, low white blood cell count, and is currently being managed by Dr. Jana Hakim hematologist.  Patient is seen and reassessed by the psychiatric nurse practitioner, she denies any depressive symptoms and or passive suicidal ideations today.  She reports contributing factors to include medication, parents, and overall support.  Patient has tolerated her medications well to include recent increase of Zoloft to 50 mg p.o. daily, and Remeron 30 mg p.o. daily.  Remeron appeared to be increased by oncologist from 7.5 mg to 30 mg.  Patient continues to have a poor appetite, not due to the medication yet overall fear of nausea and vomiting/diarrhea as it relates to her ongoing chemotherapy treatments.  She denies any side effects, and adverse reactions to include worsening nausea and or GI symptoms at this time.  Patient appears to be very optimistic about returning to live with her parents, and continuing to receive appropriate care.  At present she continues to endorse passive suicidal ideations, and worsening depressive symptoms.  She continues to deny any mania, paranoia, psychosis, hallucinations,  and otherwise psychiatric symptoms.  She further denies any active suicidal thoughts or ideations, and or self-harm behaviors.  Psychiatry team to sign off at this time, safety planning has been initiated and developed with her mother at the bedside.  Patient will need appropriate disposition planning from [facility], consult was placed yesterday no changes or updates noted in the system.  HPI:  50 year old female significant history of breast cancer currently undergoing chemotherapy and radiation brought here via EMS with complaint of for depression.  Patient states she is here because she feels very depressed with having suicidal thoughts but no specific plan.  She attributed to her depression stemming from active cancer diagnosis that she thought she previously have.  But then this cancer returns.  She also mention that she lives alone and she has no motivation to get up this morning to do anything.  She feels dehydrated because she does not have any appetite due to loss of taste and smell has been ongoing for several weeks. She denies homicidal ideation auditory or visual hallucination.  She is sleeping more.  She has never been formally diagnosed with depression.  Denies any active pain at this time.  Past Psychiatric History: See above  Risk to Self: Yes Risk to Others: Denies  Prior Inpatient Therapy: Denies  Prior Outpatient Therapy: Denies  Past Medical History:  Past Medical History:  Diagnosis Date   Arthritis    lower back   Cancer ([place])    right breast cancer   Family history of breast cancer    Personal history of chemotherapy    Personal history of radiation therapy     Past Surgical History:  Procedure Laterality Date  ABDOMINAL HYSTERECTOMY     APPLICATION OF CRANIAL NAVIGATION N/A 12/06/2020   Procedure: APPLICATION OF CRANIAL NAVIGATION;  Surgeon: Judith Part, MD;  Location: [place];  Service: Neurosurgery;  Laterality: N/A;  RM 20   BREAST LUMPECTOMY Right    BREAST  LUMPECTOMY WITH RADIOACTIVE SEED AND SENTINEL LYMPH NODE BIOPSY Right 02/16/2018   Procedure: RIGHT BREAST LUMPECTOMY WITH RADIOACTIVE SEED AND SENTINEL LYMPH NODE BIOPSY;  Surgeon: [name], MD;  Location: [place];  Service: General;  Laterality: Right;   CRANIOTOMY Right 12/06/2020   Procedure: Right sided awake craniotomy for tumor resection;  Surgeon: Judith Part, MD;  Location: [place];  Service: Neurosurgery;  Laterality: Right;   HYMENECTOMY     IR IMAGING GUIDED PORT INSERTION  03/12/2021   IR US GUIDE BX ASP/DRAIN  12/02/2020   KYPHOPLASTY N/A 03/07/2021   Procedure: Thoracic nine, Thoracic ten KYPHOPLASTY;  Surgeon: Judith Part, MD;  Location: [place];  Service: Neurosurgery;  Laterality: N/A;   PORTACATH PLACEMENT Right 02/16/2018   Procedure: INSERTION PORT-A-CATH;  Surgeon: [name], MD;  Location: [place];  Service: General;  Laterality: Right;   RE-EXCISION OF BREAST LUMPECTOMY Right 02/22/2018   Procedure: RE-EXCISION OF RIGHT  BREAST LUMPECTOMY;  Surgeon: [name], MD;  Location: [place];  Service: General;  Laterality: Right;   REPAIR VAGINAL CUFF N/A 02/07/2017   Procedure: REPAIR VAGINAL CUFF;  Surgeon: Ena Dawley, MD;  Location: [facility];  Service: Gynecology;  Laterality: N/A;   ROBOTIC ASSISTED TOTAL HYSTERECTOMY WITH SALPINGECTOMY Left 01/20/2017   Procedure: ROBOTIC ASSISTED TOTAL HYSTERECTOMY WITH SALPINGECTOMY;  Surgeon: [name], MD;  Location: [facility];  Service: Gynecology;  Laterality: Left;   TOTAL HIP ARTHROPLASTY Right 2022   total hip and partial femur, per pt   Family History:  Family History  Problem Relation Age of Onset   Lung cancer Maternal Grandfather    Esophageal cancer Paternal Grandfather 69   Breast cancer Cousin 25   Family Psychiatric  History: Sister diagnosed with depression, daughter diagnosed with depression and anxiety Social History:  Social  History   Substance and Sexual Activity  Alcohol Use Not Currently     Social History   Substance and Sexual Activity  Drug Use No    Social History   Socioeconomic History   Marital status: Legally Separated    Spouse name: Not on file   Number of children: Not on file   Years of education: Not on file   Highest education level: Not on file  Occupational History   Not on file  Tobacco Use   Smoking status: Never   Smokeless tobacco: Never  Vaping Use   Vaping Use: Former  Substance and Sexual Activity   Alcohol use: Not Currently   Drug use: No   Sexual activity: Not Currently    Birth control/protection: Surgical  Other Topics Concern   Not on file  Social History Narrative   Not on file   Social Determinants of Health   Financial Resource Strain: Not on file  Food Insecurity: Not on file  Transportation Needs: Not on file  Physical Activity: Not on file  Stress: Not on file  Social Connections: Not on file   Additional Social History:    Allergies:  No Known Allergies  Labs:  Results for orders placed or performed during the hospital encounter of 05/05/21 (from the past 48 hour(s))  Basic metabolic panel  Status: Abnormal   Collection Time: 05/07/21  5:38 PM  Result Value Ref Range   Sodium 139 135 - 145 mmol/L   Potassium 2.6 (LL) 3.5 - 5.1 mmol/L    Comment: CRITICAL RESULT CALLED TO, READ BACK BY AND VERIFIED WITH: ROBINSON,K.RN @1905  ON 05/07/2021 BY COHEN,K    Chloride 110 98 - 111 mmol/L   CO2 23 22 - 32 mmol/L   Glucose, Bld 109 (H) 70 - 99 mg/dL    Comment: Glucose reference range applies only to samples taken after fasting for at least 8 hours.   BUN 10 6 - 20 mg/dL   Creatinine, Ser 0.46 0.44 - 1.00 mg/dL   Calcium 6.4 (LL) 8.9 - 10.3 mg/dL    Comment: CRITICAL RESULT CALLED TO, READ BACK BY AND VERIFIED WITH: ROBINSON,K.RN @1905  ON 05/07/2021 BY COHEN,K    GFR, Estimated >60 >60 mL/min    Comment: (NOTE) Calculated using the  CKD-EPI Creatinine Equation (2021)    Anion gap 6 5 - 15    Comment: Performed at [facility], [place] [address], [place], [place] [address]  Phosphorus     Status: Abnormal   Collection Time: 05/07/21  5:45 PM  Result Value Ref Range   Phosphorus <1.0 (LL) 2.5 - 4.6 mg/dL    Comment: CRITICAL RESULT CALLED TO, READ BACK BY AND VERIFIED WITH: Delrae Sawyers. RN @1946  ON 05/07/2021 BY Lenord Carbo Performed at [facility], [place] [address], [place], [place] [address]   CBC WITH DIFFERENTIAL     Status: Abnormal   Collection Time: 05/08/21  5:53 AM  Result Value Ref Range   WBC 5.5 4.0 - 10.5 K/uL   RBC 2.70 (L) 3.87 - 5.11 MIL/uL   Hemoglobin 9.0 (L) 12.0 - 15.0 g/dL   HCT 27.4 (L) 36.0 - 46.0 %   MCV 101.5 (H) 80.0 - 100.0 fL   MCH 33.3 26.0 - 34.0 pg   MCHC 32.8 30.0 - 36.0 g/dL   RDW 22.7 (H) 11.5 - 15.5 %   Platelets 172 150 - 400 K/uL   nRBC 0.6 (H) 0.0 - 0.2 %   Neutrophils Relative % 56 %   Neutro Abs 3.5 1.7 - 7.7 K/uL   Band Neutrophils 8 %   Lymphocytes Relative 7 %   Lymphs Abs 0.4 (L) 0.7 - 4.0 K/uL   Monocytes Relative 10 %   Monocytes Absolute 0.6 0.1 - 1.0 K/uL   Eosinophils Relative 1 %   Eosinophils Absolute 0.1 0.0 - 0.5 K/uL   Basophils Relative 2 %   Basophils Absolute 0.1 0.0 - 0.1 K/uL   WBC Morphology      MODERATE LEFT SHIFT (>5% METAS AND MYELOS,OCC PRO NOTED)    Comment: TOXIC GRANULATION   Metamyelocytes Relative 2 %   Myelocytes 13 %   Promyelocytes Relative 1 %   Abs Immature Granulocytes 0.90 (H) 0.00 - 0.07 K/uL   Polychromasia PRESENT     Comment: Performed at [facility], [place] [address] [place], [place] [address]  Basic metabolic panel     Status: Abnormal   Collection Time: 05/08/21  5:53 AM  Result Value Ref Range   Sodium 138 135 - 145 mmol/L   Potassium 3.0 (L) 3.5 - 5.1 mmol/L   Chloride 109 98 - 111 mmol/L   CO2 24 22 - 32 mmol/L   Glucose, Bld 100 (H) 70 - 99 mg/dL    Comment:  Glucose reference range applies only to samples  taken after fasting for at least 8 hours.   BUN <5 (L) 6 - 20 mg/dL   Creatinine, Ser <0.30 (L) 0.44 - 1.00 mg/dL   Calcium 6.5 (L) 8.9 - 10.3 mg/dL   GFR, Estimated NOT CALCULATED >60 mL/min    Comment: (NOTE) Calculated using the CKD-EPI Creatinine Equation (2021)    Anion gap 5 5 - 15    Comment: Performed at [facility], [place] [address] [place], [place] [address]  Magnesium     Status: None   Collection Time: 05/08/21  5:53 AM  Result Value Ref Range   Magnesium 2.0 1.7 - 2.4 mg/dL    Comment: Performed at [facility], [place] [address] [place], [place] [address]  Phosphorus     Status: Abnormal   Collection Time: 05/08/21  5:53 AM  Result Value Ref Range   Phosphorus <1.0 (LL) 2.5 - 4.6 mg/dL    Comment: CRITICAL RESULT CALLED TO, READ BACK BY AND VERIFIED WITH: Clearence Cheek RN @1039  ON 10.27.2022 BY [facility] Performed at [facility], [place] [address], [place], [place] [address]   Hepatic function panel     Status: Abnormal   Collection Time: 05/08/21  5:53 AM  Result Value Ref Range   Total Protein 4.4 (L) 6.5 - 8.1 g/dL   Albumin 2.1 (L) 3.5 - 5.0 g/dL   AST 15 15 - 41 U/L   ALT 18 0 - 44 U/L   Alkaline Phosphatase 110 38 - 126 U/L   Total Bilirubin 0.8 0.3 - 1.2 mg/dL   Bilirubin, Direct 0.2 0.0 - 0.2 mg/dL   Indirect Bilirubin 0.6 0.3 - 0.9 mg/dL    Comment: Performed at [facility], [place] [address], [place], [place] [address]  Comprehensive metabolic panel     Status: Abnormal   Collection Time: 05/09/21  7:22 AM  Result Value Ref Range   Sodium 139 135 - 145 mmol/L   Potassium 4.0 3.5 - 5.1 mmol/L    Comment: DELTA CHECK NOTED NO VISIBLE HEMOLYSIS    Chloride 108 98 - 111 mmol/L   CO2 26 22 - 32 mmol/L   Glucose, Bld 118 (H) 70 - 99 mg/dL    Comment: Glucose reference range applies only to samples taken after fasting for at least 8 hours.    BUN <5 (L) 6 - 20 mg/dL   Creatinine, Ser 0.45 0.44 - 1.00 mg/dL   Calcium 6.7 (L) 8.9 - 10.3 mg/dL   Total Protein 4.7 (L) 6.5 - 8.1 g/dL   Albumin 2.3 (L) 3.5 - 5.0 g/dL   AST 18 15 - 41 U/L   ALT 18 0 - 44 U/L   Alkaline Phosphatase 145 (H) 38 - 126 U/L   Total Bilirubin 0.7 0.3 - 1.2 mg/dL   GFR, Estimated >60 >60 mL/min    Comment: (NOTE) Calculated using the CKD-EPI Creatinine Equation (2021)    Anion gap 5 5 - 15    Comment: Performed at [facility], [place] [address], [place], [place] [address]  Phosphorus     Status: Abnormal   Collection Time: 05/09/21  7:22 AM  Result Value Ref Range   Phosphorus <1.0 (LL) 2.5 - 4.6 mg/dL    Comment: CRITICAL RESULT CALLED TO, READ BACK BY AND VERIFIED WITH: BELL, K. RN @0838  ON 10.28.2022 BY [facility] Performed at [facility], [place] [address] [place], [place] [address]   CBC     Status: Abnormal  Collection Time: 05/09/21  7:22 AM  Result Value Ref Range   WBC 7.6 4.0 - 10.5 K/uL   RBC 2.78 (L) 3.87 - 5.11 MIL/uL   Hemoglobin 9.3 (L) 12.0 - 15.0 g/dL   HCT 28.3 (L) 36.0 - 46.0 %   MCV 101.8 (H) 80.0 - 100.0 fL   MCH 33.5 26.0 - 34.0 pg   MCHC 32.9 30.0 - 36.0 g/dL   RDW 23.5 (H) 11.5 - 15.5 %   Platelets 207 150 - 400 K/uL   nRBC 1.5 (H) 0.0 - 0.2 %    Comment: Performed at [facility], [place] [address], [place], [place] [address]    Current Facility-Administered Medications  Medication Dose Route Frequency Provider Last Rate Last Admin   acetaminophen (TYLENOL) tablet 650 mg  650 mg Oral Q6H PRN Reubin Milan, MD   650 mg at 05/06/21 0544   Or   acetaminophen (TYLENOL) suppository 650 mg  650 mg Rectal Q6H PRN Reubin Milan, MD       bismuth subsalicylate (PEPTO BISMOL) chewable tablet 524 mg  524 mg Oral PRN Kathryne Eriksson, NP   524 mg at 05/07/21 2228   calcium gluconate 2 g/ 100 mL sodium chloride IVPB  2 g Intravenous Once Polly Cobia, RPH        Chlorhexidine Gluconate Cloth 2 % PADS 6 each  6 each Topical Daily Richarda Osmond, MD   6 each at 05/09/21 1017   cholestyramine light (PREVALITE) packet 4 g  4 g Oral TID Magrinat, [name], MD   4 g at 05/09/21 1012   enoxaparin (LOVENOX) injection 40 mg  40 mg Subcutaneous Q24H Reubin Milan, MD   40 mg at 05/08/21 1717   feeding supplement (ENSURE ENLIVE / ENSURE PLUS) liquid 237 mL  237 mL Oral TID BM Richarda Osmond, MD   237 mL at 05/09/21 1017   fluconazole (DIFLUCAN) tablet 100 mg  100 mg Oral Daily Reubin Milan, MD   100 mg at 05/09/21 0859   gabapentin (NEURONTIN) capsule 300 mg  300 mg Oral TID Reubin Milan, MD   300 mg at 05/09/21 0859   guaiFENesin-dextromethorphan (ROBITUSSIN DM) 100-10 MG/5ML syrup 5 mL  5 mL Oral Q4H PRN Reubin Milan, MD   5 mL at 05/06/21 0242   ibuprofen (ADVIL) 100 MG/5ML suspension 200 mg  200 mg Oral Q6H PRN Kathryne Eriksson, NP   200 mg at 05/06/21 0007   ketotifen (ZADITOR) 0.025 % ophthalmic solution 1 drop  1 drop Both Eyes BID Richarda Osmond, MD   1 drop at 05/09/21 1017   loperamide (IMODIUM) capsule 4 mg  4 mg Oral PRN Richarda Osmond, MD   4 mg at 05/07/21 1524   LORazepam (ATIVAN) tablet 0.25 mg  0.25 mg Oral TID AC & HS Magrinat, [name], MD   0.25 mg at 05/09/21 0858   mirtazapine (REMERON) tablet 30 mg  30 mg Oral QHS Magrinat, [name], MD   30 mg at 05/08/21 2208   multivitamin with minerals tablet 1 tablet  1 tablet Oral Daily Richarda Osmond, MD   1 tablet at 05/09/21 0858   octreotide (SANDOSTATIN) injection 100 mcg  100 mcg Subcutaneous Q12H Magrinat, [name], MD   100 mcg at 05/09/21 0607   ondansetron (ZOFRAN) injection 4 mg  4 mg Intravenous TID AC & HS Magrinat, [name], MD   4 mg at  05/09/21 0900   Or   ondansetron (ZOFRAN) tablet 4 mg  4 mg Oral TID AC & HS Magrinat, [name], MD   4 mg at 05/08/21 2208   pantoprazole (PROTONIX) EC tablet 40 mg  40 mg Oral Daily Reubin Milan, MD    40 mg at 05/09/21 9024   polyvinyl alcohol (LIQUIFILM TEARS) 1.4 % ophthalmic solution 1 drop  1 drop Both Eyes PRN Richarda Osmond, MD       sertraline (ZOLOFT) tablet 50 mg  50 mg Oral Daily Magrinat, [name], MD   50 mg at 05/09/21 0973   sodium phosphate 30 mmol in dextrose 5 % 250 mL infusion  30 mmol Intravenous BID Polly Cobia, RPH 43 mL/hr at 05/09/21 1137 30 mmol at 05/09/21 1137   tucatinib (TUKYSA) tablet 150 mg  150 mg Oral BID Magrinat, [name], MD   150 mg at 05/09/21 1013   valACYclovir (VALTREX) tablet 1,000 mg  1,000 mg Oral Daily Reubin Milan, MD   1,000 mg at 05/09/21 1012   Facility-Administered Medications Ordered in Other Encounters  Medication Dose Route Frequency Provider Last Rate Last Admin   sodium chloride flush (NS) 0.9 % injection 10 mL  10 mL Intracatheter Once Magrinat, [name], MD        Musculoskeletal: Strength & Muscle Tone: within normal limits Gait & Station: normal Patient leans: N/A            Psychiatric Specialty Exam:  Presentation  General Appearance: Appropriate for Environment; Casual  Eye Contact:Good  Speech:Clear and Coherent; Normal Rate  Speech Volume:Normal  Handedness:Right   Mood and Affect  Mood:Euthymic  Affect:Appropriate; Congruent   Thought Process  Thought Processes:Coherent; Linear; Goal Directed  Descriptions of Associations:Intact  Orientation:Full (Time, Place and Person)  Thought Content:Logical  History of Schizophrenia/Schizoaffective disorder:No data recorded Duration of Psychotic Symptoms:No data recorded Hallucinations:Hallucinations: None  Ideas of Reference:None  Suicidal Thoughts:Suicidal Thoughts: No  Homicidal Thoughts:Homicidal Thoughts: No   Sensorium  Memory:Immediate Good; Recent Fair; Remote Fair  Judgment:Good  Insight:Good   Executive Functions  Concentration:Good  Attention Span:Good  [place]  Language:Good   Psychomotor Activity  Psychomotor Activity:Psychomotor Activity: Normal   Assets  Assets:Communication Skills; Leisure Time; Physical Health; Desire for Improvement; Financial Resources/Insurance; Housing; Social Support; Resilience   Sleep  Sleep:Sleep: Fair  Physical Exam: Physical Exam Vitals and nursing note reviewed.  Constitutional:      Appearance: Normal appearance. She is normal weight.  HENT:     Head: Normocephalic.  Eyes:     Pupils: Pupils are equal, round, and reactive to light.  Skin:    General: Skin is warm and dry.  Neurological:     General: No focal deficit present.     Mental Status: She is alert and oriented to person, place, and time. Mental status is at baseline.  Psychiatric:        Attention and Perception: Attention and perception normal.        Mood and Affect: Mood and affect normal. Mood is not depressed. Affect is not flat.        Speech: Speech normal.        Behavior: Behavior normal. Behavior is cooperative.        Thought Content: Thought content normal. Thought content does not include suicidal ideation.        Cognition and Memory: Cognition and memory normal.        Judgment:  Judgment is not impulsive.   Review of Systems  Psychiatric/Behavioral: Negative.  Negative for depression and suicidal ideas.   All other systems reviewed and are negative. Blood pressure 124/89, pulse 94, temperature 98.4 F (36.9 C), temperature source Oral, resp. rate 19, height 5\' 8"  (1.727 m), weight 72.4 kg, last menstrual period 12/21/2016, SpO2 99 %. Body mass index is 24.27 kg/m.  Treatment Plan Summary: Plan   patient originally presented with worsening depressive symptoms, recurrent thoughts of death, suicidal thoughts and ideations with a plan to overdose on her chemotherapy medication.  On today's evaluation (day 7 psychiatric treatment ) patient reports improvement in her overall mental health, she notes  medication, rest, and overall support as contributing factors for improvement in symptoms.  She has been treated with Zoloft 50 mg p.o. daily and Remeron 30 mg p.o. nightly, and which she is tolerating well at this time.  She denies any side effects and or adverse reactions to either medication at this time.  Reviewed course of plan, continue to expect about a 4-week turnaround before therapeutic effect is seen.  Patient denies any suicidal thoughts, suicidal ideations, and or self-harm behaviors at this time.  -Safety planning did take place with patient's mother who is at the bedside this afternoon.  This include daily check-in's, appropriate coordination of care once her insurance and [place] to [place], 988 crisis line, importance of completing and seeking therapeutic services in [place] to include partial hospitalization programming and intensive outpatient programming.  Mother does have experience working with family members who have underlying mental condition such as depression and dementia.  She does feel comfortable and has enough support at home to provide adequate care and services for her daughter.  Mother also reports her other daughter and husband are also in the home, and are willing to provide appropriate services to care for the patient.  Mother discussed some local facilities in the area to include [facility] which is locating in [place].  Attempted to add follow-up for [facility] behavioral health, however it is not located in our EMR system.  Writer was able to free text resources found online to include 100 behavioral health, genpsych, and suicide hotline.  Support, encouragement, and reassurance were offered.  -Recommend continue to work with [facility] to refer patient for appropriate behavioral health resources to include partial hospitalization programming and or intensive outpatient programming in [place].  It is important and that we  initiate contact, and schedule appointment if possible. -Due to patient relocating will also submit for prescription refills with a 1 month refill supply to ensure appropriate compliance with medication.  Prescriptions will be sent to the pharmacy on file. -Discussed safety plan with mother and patient, patient is able to contract for safety. -Will psychiatrically clear patient at this time to follow up with outpatient resources as noted above and in follow-up discharge.   Disposition: No evidence of imminent risk to self or others at present.   Supportive therapy provided about ongoing stressors. Discussed crisis plan, support from social network, calling 911, coming to the Emergency Department, and calling Suicide Hotline. Refer to PHP.   Suella Broad, FNP 05/09/2021 12:44 PM

## 2021-05-09 NOTE — TOC Initial Note (Signed)
Transition of Care [facility]) - Initial/Assessment Note    Patient Details  Name: Monica [name] MRN: [id number] Date of Birth: Nov 19, 1970  Transition of Care [facility]) CM/SW Contact:    Lynnell Catalan, RN Phone Number: 05/09/2021, 2:38 PM  Clinical Narrative:                 [facility] consult for outpatient behavioral health resources/referral in [place]. Spoke with pt and mother at bedside to confirm what town in [place] that pt would be relocating to. Mother states that they live in [place]. She also states that the major medical center nearby is [facility]. Call was placed to [facility] department to inquire about outpatient programs/partial hospitalization programs. Spoke with Wendelyn Breslow about possible programs. There is a potential for barrier with treatment because of pt's insurance ([facility]). Pt will need to obtain [place] when she relocates. Pt Psych notes and demographics faxed to Wendelyn Breslow (Fax: [phone] Phone: [phone]) to see what can be done while pt is waiting for application for [place] Medicaid. TOC will await return phone call from [facility].        Activities of Daily Living Home Assistive Devices/Equipment: Eyeglasses, Gilford Rile (specify type) ADL Screening (condition at time of admission) Patient's cognitive ability adequate to safely complete daily activities?: Yes Is the patient deaf or have difficulty hearing?: No Does the patient have difficulty seeing, even when wearing glasses/contacts?: No Does the patient have difficulty concentrating, remembering, or making decisions?: No Patient able to express need for assistance with ADLs?: Yes Does the patient have difficulty dressing or bathing?: No Independently performs ADLs?: Yes (appropriate for developmental age) Does the patient have difficulty walking or climbing stairs?: Yes Weakness of Legs: Both Weakness of Arms/Hands: None      Admission diagnosis:  Neutropenic fever  ([place]) [D70.9, R50.81] Febrile neutropenia ([place]) [D70.9, R50.81] Patient Active Problem List   Diagnosis Date Noted   Refeeding syndrome    Malnutrition of moderate degree 05/06/2021   Neutropenic fever ([place]) 05/05/2021   Febrile neutropenia ([place]) 05/05/2021   Diarrhea 05/05/2021   Major depressive disorder 05/04/2021   Compression fracture of vertebral column ([place]) 05/03/2021   MDD (major depressive disorder), single episode with melancholic features 66/44/0347   Suicidal ideation 05/02/2021   Body mass index (BMI) 25.0-25.9, adult 03/20/2021   Impaired mobility and ADLs 01/27/2021   Superficial thrombophlebitis of left upper extremity 01/26/2021   Acneiform rash 01/22/2021   Elevated LFTs 01/22/2021   Hypoalbuminemia due to protein-calorie malnutrition ([place]) 01/22/2021   Metastatic breast cancer ([place]) 01/21/2021   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[place]) 01/21/2021   Hypokalemia    Hypomagnesemia    Hypercalcemia    Pain 01/14/2021   Lung metastases ([place]) 12/31/2020   Liver metastases ([place]) 12/31/2020   Brain tumor ([place]) 12/06/2020   Palliative care by specialist    Bone metastases [facility])    Metastatic malignant neoplasm ([place])    Brain metastases ([place])    Cancer related pain 11/30/2020   Port-A-Cath in place 03/08/2018   Goals of care, counseling/discussion 02/03/2018   Family history of breast cancer 01/12/2018   Malignant neoplasm of upper-inner quadrant of right breast in female, estrogen receptor positive ([place]) 01/05/2018   Vaginal bleeding 02/07/2017   Chronic anemia 02/07/2017   Orthostasis 02/07/2017   S/P laparoscopic hysterectomy 01/20/2017   PCP:  Beverley Fiedler, FNP Pharmacy:   [id number] - [place], [address]  [place] [address] Phone: [phone] Fax: [phone]     Social Determinants of Health (SDOH) Interventions    Readmission Risk Interventions Readmission Risk Prevention  Plan 01/21/2021  Transportation Screening Complete  PCP or Specialist Appt within 3-5 Days Not Complete  Not Complete comments plans to transfer to tertiary care center  [facility] or [place] Complete  Social Work Consult for [place] Planning/Counseling Complete  Palliative Care Screening Not Applicable  Medication Review [employer]) Complete  Some recent data might be hidden

## 2021-05-09 NOTE — Progress Notes (Signed)
PROGRESS NOTE  Monica [name]    DOB: 28-Nov-1970, 50 y.o.  JXB:[id number]  PCP: Beverley Fiedler, FNP   Code Status: Full Code   DOA: 05/05/2021   LOS: 4  Brief Narrative of Current Hospitalization  Monica [name] is a 50 y.o. female with a PMH significant for osteoarthritis, depression, metastatic breast cancer, recent behavioral health admission for suicidal ideations. They presented from behavioral health hospital to the ED on 05/05/2021 with abdominal pain, nausea, vomiting x 1 days. In the ED, it was found that they had a fever with low white blood cell count. They were treated with IV antibiotics, IV fluids, Tylenol.  Heme/onc was consulted.  Patient was admitted to medicine service for further workup and management of neutropenic fever as outlined in detail below.  05/09/21 - improved  Assessment & Plan  Principal Problem:   Febrile neutropenia (HCC) Active Problems:   Malignant neoplasm of upper-inner quadrant of right breast in female, estrogen receptor positive (HCC)   Hypokalemia   Suicidal ideation   Major depressive disorder   Diarrhea   Malnutrition of moderate degree   Refeeding syndrome  Febrile neutropenia-in setting of malignancy. Resolved. WBC count 7.6 today. She states that she has not yet had diarrhea today and only once small episode yesterday. - imodium PRN for diarrhea - heme onc following, appreciate recommendations - discharge planning per heme/onc decision -completed cefepime (10/24-10/27) -As needed antiemetics  Nausea/diarrhea- likely medication SE. Improving. - oncology added octreotide, questran. Wean and discontinue as able.   Refeeding syndrome- phosphorus continues to be <1 today. Potentially resistant to repletion with medication interactions. Will talk with oncology on possible alterations. Will be very slow to discharge her with electrolyte instability as she will have difficult time seeking primary care again in new [place]. Will need to  establish medicaid in [place].  - pharmacologist following for electrolyte repletion, appreciate recs.  Hypokalemia- resolved K+ 4.0 today. - per pharmacy - discontinue IV fluids as patient is increasing PO intake well  Major depressive order-denies SI -Continue home behavioral health medications including Remeron, sertraline - at increased doses, per psych. 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  -Management per heme-onc -Brain MRI 10/26 stable/improved -PT/OT evaluation without follow up  DVT prophylaxis: enoxaparin (LOVENOX) injection 40 mg Start: 05/05/21 1800  Diet:  Diet Orders (From admission, onward)     Start     Ordered   05/05/21 1534  Diet Heart Room service appropriate? Yes; Fluid consistency: Thin  Diet effective now       Question Answer Comment  Room service appropriate? Yes   Fluid consistency: Thin      05/05/21 1533            Subjective 05/09/21    Pt reports doing well today. Only one small episode of diarrhea yesterday. She is eating a large portion of breakfast today.  Disposition Plan & Communication  Status is: Inpatient  Remains inpatient appropriate because: ongoing workup   Inpatient To Be Determined  Family Communication: none  Consults, Procedures, Significant Events  Consultants:  Heme/onc psychiatry  Procedures/significant events:  Brain MRI 10/26  Antimicrobials:  Anti-infectives (From admission, onward)    Start     Dose/Rate Route Frequency Ordered Stop   05/06/21 1000  valACYclovir (VALTREX) tablet 1,000 mg       Note  to Pharmacy: Take one tablet twice a day for one week, then daily     1,000 mg Oral Daily 05/05/21 1633     05/06/21 1000  fluconazole (DIFLUCAN) tablet 100 mg        100 mg Oral  Daily 05/05/21 1633     05/05/21 2000  ceFEPIme (MAXIPIME) 2 g in sodium chloride 0.9 % 100 mL IVPB        2 g 200 mL/hr over 30 Minutes Intravenous Every 8 hours 05/05/21 1533 05/08/21 1505        Objective   Vitals:   05/08/21 0554 05/08/21 1345 05/08/21 2007 05/09/21 0521  BP: 122/86 (!) 121/94 109/83 124/89  Pulse: 95 (!) 104 (!) 101 94  Resp: 16  20 19   Temp: 99.5 F (37.5 C) 98.9 F (37.2 C) 99.3 F (37.4 C) 98.4 F (36.9 C)  TempSrc: Oral Oral Oral Oral  SpO2: 96% 98% 97% 99%  Weight:      Height:        Intake/Output Summary (Last 24 hours) at 05/09/2021 0748 Last data filed at 05/09/2021 0302 Gross per 24 hour  Intake 2248.04 ml  Output 1650 ml  Net 598.04 ml    Filed Weights   05/06/21 0248 05/06/21 1558  Weight: 66.7 kg 72.4 kg    Patient BMI: Body mass index is 24.27 kg/m.   Physical Exam: General: awake, alert, NAD HEENT: atraumatic, clear conjunctiva, anicteric sclera, eyes watering, moist mucus membranes, hearing grossly normal Respiratory: normal respiratory effort. Cardiovascular: normal S1/S2,  RRR, no JVD, murmurs, rubs, gallops, quick capillary refill  Nervous: A&O x3. no gross focal neurologic deficits, normal speech Extremities: moves all equally, no edema, normal tone Skin: dry, intact, normal temperature, normal color, No rashes, lesions or ulcers Psychiatry: flat mood, affect  Labs   I have personally reviewed following labs and imaging studies Admission on 05/05/2021  Component Date Value Ref Range Status   MRSA by PCR Next Gen 05/05/2021 NOT DETECTED  NOT DETECTED Final   C Diff antigen 05/06/2021 NEGATIVE  NEGATIVE Final   C Diff toxin 05/06/2021 NEGATIVE  NEGATIVE Final   C Diff interpretation 05/06/2021 No C. difficile detected.   Final   WBC 05/06/2021 1.6 (A)  4.0 - 10.5 K/uL Final   RBC 05/06/2021 3.05 (A)  3.87 - 5.11 MIL/uL Final   Hemoglobin 05/06/2021 10.1 (A)  12.0 - 15.0 g/dL Final   HCT 05/06/2021 30.6 (A)  36.0 -  46.0 % Final   MCV 05/06/2021 100.3 (A)  80.0 - 100.0 fL Final   MCH 05/06/2021 33.1  26.0 - 34.0 pg Final   MCHC 05/06/2021 33.0  30.0 - 36.0 g/dL Final   RDW 05/06/2021 22.4 (A)  11.5 - 15.5 % Final   Platelets 05/06/2021 145 (A)  150 - 400 K/uL Final   nRBC 05/06/2021 1.3 (A)  0.0 - 0.2 % Final   Neutrophils Relative % 05/06/2021 75  % Final   Neutro Abs 05/06/2021 1.3 (A)  1.7 - 7.7 K/uL Final   Band Neutrophils 05/06/2021 4  % Final   Lymphocytes Relative 05/06/2021 9  % Final   Lymphs Abs 05/06/2021 0.1 (A)  0.7 - 4.0 K/uL Final   Monocytes Relative 05/06/2021 4  % Final   Monocytes Absolute 05/06/2021 0.1  0.1 - 1.0 K/uL Final   Eosinophils Relative 05/06/2021 2  % Final   Eosinophils Absolute 05/06/2021 0.0  0.0 - 0.5 K/uL Final   Basophils Relative 05/06/2021 0  %  Final   Basophils Absolute 05/06/2021 0.0  0.0 - 0.1 K/uL Final   WBC Morphology 05/06/2021 DOHLE BODIES   Final   Myelocytes 05/06/2021 6  % Final   Abs Immature Granulocytes 05/06/2021 0.10 (A)  0.00 - 0.07 K/uL Final   Polychromasia 05/06/2021 PRESENT   Final   Ovalocytes 05/06/2021 PRESENT   Final   Sodium 05/06/2021 136  135 - 145 mmol/L Final   Potassium 05/06/2021 3.0 (A)  3.5 - 5.1 mmol/L Final   Chloride 05/06/2021 105  98 - 111 mmol/L Final   CO2 05/06/2021 24  22 - 32 mmol/L Final   Glucose, Bld 05/06/2021 121 (A)  70 - 99 mg/dL Final   BUN 05/06/2021 13  6 - 20 mg/dL Final   Creatinine, Ser 05/06/2021 0.53  0.44 - 1.00 mg/dL Final   Calcium 05/06/2021 6.3 (A)  8.9 - 10.3 mg/dL Final   Total Protein 05/06/2021 5.4 (A)  6.5 - 8.1 g/dL Final   Albumin 05/06/2021 2.5 (A)  3.5 - 5.0 g/dL Final   AST 05/06/2021 21  15 - 41 U/L Final   ALT 05/06/2021 26  0 - 44 U/L Final   Alkaline Phosphatase 05/06/2021 125  38 - 126 U/L Final   Total Bilirubin 05/06/2021 1.3 (A)  0.3 - 1.2 mg/dL Final   GFR, Estimated 05/06/2021 >60  >60 mL/min Final   Anion gap 05/06/2021 7  5 - 15 Final   WBC 05/07/2021 3.7 (A)  4.0  - 10.5 K/uL Final   RBC 05/07/2021 2.82 (A)  3.87 - 5.11 MIL/uL Final   Hemoglobin 05/07/2021 9.3 (A)  12.0 - 15.0 g/dL Final   HCT 05/07/2021 28.5 (A)  36.0 - 46.0 % Final   MCV 05/07/2021 101.1 (A)  80.0 - 100.0 fL Final   MCH 05/07/2021 33.0  26.0 - 34.0 pg Final   MCHC 05/07/2021 32.6  30.0 - 36.0 g/dL Final   RDW 05/07/2021 22.5 (A)  11.5 - 15.5 % Final   Platelets 05/07/2021 157  150 - 400 K/uL Final   nRBC 05/07/2021 0.8 (A)  0.0 - 0.2 % Final   Neutrophils Relative % 05/07/2021 68  % Final   Neutro Abs 05/07/2021 2.5  1.7 - 7.7 K/uL Final   Lymphocytes Relative 05/07/2021 6  % Final   Lymphs Abs 05/07/2021 0.2 (A)  0.7 - 4.0 K/uL Final   Monocytes Relative 05/07/2021 15  % Final   Monocytes Absolute 05/07/2021 0.6  0.1 - 1.0 K/uL Final   Eosinophils Relative 05/07/2021 3  % Final   Eosinophils Absolute 05/07/2021 0.1  0.0 - 0.5 K/uL Final   Basophils Relative 05/07/2021 1  % Final   Basophils Absolute 05/07/2021 0.0  0.0 - 0.1 K/uL Final   WBC Morphology 05/07/2021 MILD LEFT SHIFT (1-5% METAS, OCC MYELO, OCC BANDS)   Final   Immature Granulocytes 05/07/2021 7  % Final   Abs Immature Granulocytes 05/07/2021 0.26 (A)  0.00 - 0.07 K/uL Final   Polychromasia 05/07/2021 PRESENT   Final   Sodium 05/07/2021 135  135 - 145 mmol/L Final   Potassium 05/07/2021 2.6 (A)  3.5 - 5.1 mmol/L Final   Chloride 05/07/2021 107  98 - 111 mmol/L Final   CO2 05/07/2021 21 (A)  22 - 32 mmol/L Final   Glucose, Bld 05/07/2021 109 (A)  70 - 99 mg/dL Final   BUN 05/07/2021 14  6 - 20 mg/dL Final   Creatinine, Ser 05/07/2021 0.45  0.44 - 1.00  mg/dL Final   Calcium 05/07/2021 6.0 (A)  8.9 - 10.3 mg/dL Final   GFR, Estimated 05/07/2021 >60  >60 mL/min Final   Anion gap 05/07/2021 7  5 - 15 Final   Sodium 05/07/2021 139  135 - 145 mmol/L Final   Potassium 05/07/2021 2.6 (A)  3.5 - 5.1 mmol/L Final   Chloride 05/07/2021 110  98 - 111 mmol/L Final   CO2 05/07/2021 23  22 - 32 mmol/L Final   Glucose, Bld  05/07/2021 109 (A)  70 - 99 mg/dL Final   BUN 05/07/2021 10  6 - 20 mg/dL Final   Creatinine, Ser 05/07/2021 0.46  0.44 - 1.00 mg/dL Final   Calcium 05/07/2021 6.4 (A)  8.9 - 10.3 mg/dL Final   GFR, Estimated 05/07/2021 >60  >60 mL/min Final   Anion gap 05/07/2021 6  5 - 15 Final   WBC 05/08/2021 5.5  4.0 - 10.5 K/uL Final   RBC 05/08/2021 2.70 (A)  3.87 - 5.11 MIL/uL Final   Hemoglobin 05/08/2021 9.0 (A)  12.0 - 15.0 g/dL Final   HCT 05/08/2021 27.4 (A)  36.0 - 46.0 % Final   MCV 05/08/2021 101.5 (A)  80.0 - 100.0 fL Final   MCH 05/08/2021 33.3  26.0 - 34.0 pg Final   MCHC 05/08/2021 32.8  30.0 - 36.0 g/dL Final   RDW 05/08/2021 22.7 (A)  11.5 - 15.5 % Final   Platelets 05/08/2021 172  150 - 400 K/uL Final   nRBC 05/08/2021 0.6 (A)  0.0 - 0.2 % Final   Neutrophils Relative % 05/08/2021 56  % Final   Neutro Abs 05/08/2021 3.5  1.7 - 7.7 K/uL Final   Band Neutrophils 05/08/2021 8  % Final   Lymphocytes Relative 05/08/2021 7  % Final   Lymphs Abs 05/08/2021 0.4 (A)  0.7 - 4.0 K/uL Final   Monocytes Relative 05/08/2021 10  % Final   Monocytes Absolute 05/08/2021 0.6  0.1 - 1.0 K/uL Final   Eosinophils Relative 05/08/2021 1  % Final   Eosinophils Absolute 05/08/2021 0.1  0.0 - 0.5 K/uL Final   Basophils Relative 05/08/2021 2  % Final   Basophils Absolute 05/08/2021 0.1  0.0 - 0.1 K/uL Final   WBC Morphology 05/08/2021 MODERATE LEFT SHIFT (>5% METAS AND MYELOS,OCC PRO NOTED)   Final   Metamyelocytes Relative 05/08/2021 2  % Final   Myelocytes 05/08/2021 13  % Final   Promyelocytes Relative 05/08/2021 1  % Final   Abs Immature Granulocytes 05/08/2021 0.90 (A)  0.00 - 0.07 K/uL Final   Polychromasia 05/08/2021 PRESENT   Final   Sodium 05/08/2021 138  135 - 145 mmol/L Final   Potassium 05/08/2021 3.0 (A)  3.5 - 5.1 mmol/L Final   Chloride 05/08/2021 109  98 - 111 mmol/L Final   CO2 05/08/2021 24  22 - 32 mmol/L Final   Glucose, Bld 05/08/2021 100 (A)  70 - 99 mg/dL Final   BUN  05/08/2021 <5 (A)  6 - 20 mg/dL Final   Creatinine, Ser 05/08/2021 <0.30 (A)  0.44 - 1.00 mg/dL Final   Calcium 05/08/2021 6.5 (A)  8.9 - 10.3 mg/dL Final   GFR, Estimated 05/08/2021 NOT CALCULATED  >60 mL/min Final   Anion gap 05/08/2021 5  5 - 15 Final   Phosphorus 05/07/2021 <1.0 (A)  2.5 - 4.6 mg/dL Final   Magnesium 05/08/2021 2.0  1.7 - 2.4 mg/dL Final   Phosphorus 05/08/2021 <1.0 (A)  2.5 - 4.6 mg/dL Final  Total Protein 05/08/2021 4.4 (A)  6.5 - 8.1 g/dL Final   Albumin 05/08/2021 2.1 (A)  3.5 - 5.0 g/dL Final   AST 05/08/2021 15  15 - 41 U/L Final   ALT 05/08/2021 18  0 - 44 U/L Final   Alkaline Phosphatase 05/08/2021 110  38 - 126 U/L Final   Total Bilirubin 05/08/2021 0.8  0.3 - 1.2 mg/dL Final   Bilirubin, Direct 05/08/2021 0.2  0.0 - 0.2 mg/dL Final   Indirect Bilirubin 05/08/2021 0.6  0.3 - 0.9 mg/dL Final   WBC 05/09/2021 7.6  4.0 - 10.5 K/uL Final   RBC 05/09/2021 2.78 (A)  3.87 - 5.11 MIL/uL Final   Hemoglobin 05/09/2021 9.3 (A)  12.0 - 15.0 g/dL Final   HCT 05/09/2021 28.3 (A)  36.0 - 46.0 % Final   MCV 05/09/2021 101.8 (A)  80.0 - 100.0 fL Final   MCH 05/09/2021 33.5  26.0 - 34.0 pg Final   MCHC 05/09/2021 32.9  30.0 - 36.0 g/dL Final   RDW 05/09/2021 23.5 (A)  11.5 - 15.5 % Final   Platelets 05/09/2021 207  150 - 400 K/uL Final   nRBC 05/09/2021 1.5 (A)  0.0 - 0.2 % Final    Imaging Studies  No results found. Medications   Scheduled Meds:  Chlorhexidine Gluconate Cloth  6 each Topical Daily   cholestyramine light  4 g Oral TID   enoxaparin (LOVENOX) injection  40 mg Subcutaneous Q24H   feeding supplement  237 mL Oral TID BM   fluconazole  100 mg Oral Daily   gabapentin  300 mg Oral TID   ketotifen  1 drop Both Eyes BID   LORazepam  0.25 mg Oral TID AC & HS   mirtazapine  30 mg Oral QHS   multivitamin with minerals  1 tablet Oral Daily   octreotide  100 mcg Subcutaneous Q12H   ondansetron (ZOFRAN) IV  4 mg Intravenous TID AC & HS   Or   ondansetron  4  mg Oral TID AC & HS   pantoprazole  40 mg Oral Daily   sertraline  50 mg Oral Daily   tucatinib  150 mg Oral BID   valACYclovir  1,000 mg Oral Daily   No recently discontinued medications to reconcile  LOS: 4 days   Time spent: >74min  Richarda Osmond, DO [facility] 05/09/2021, 7:48 AM    Please refer to amion to contact the [facility] Attending or Consulting provider for this pt  [email or web address] Available by Epic secure chat 7AM-7PM. If 7PM-7AM, please contact night-coverage

## 2021-05-09 NOTE — Progress Notes (Signed)
Pharmacy consulted to replenish electrolytes in this 67 yoF with metastatic breast cancer admitted 10/24 for nausea/vomiting/diarrhea.  Assessment K improved to 4.0 after replacement with IVPB, MIVF Ca 6.7, unchanged despite 2g IV yesterday  Mag not checked; stable WNL as of yesterday (2.0) Phos remains undetectably low despite 30 mmol yesterday; likely d/t refeeding and diarrhea Continues on NS + 40 mEq KCl at 75 ml/hr (~75 mEq/day) per MD  Plan NaPhos 30 mmol (40 mEq sodium) IV x 2 Repeat Ca gluconate 2g IV x 1 Bmet, Mag, Phos in AM  Reuel Boom, PharmD, BCPS [phone] 05/09/2021, 10:39 AM

## 2021-05-10 DIAGNOSIS — F332 Major depressive disorder, recurrent severe without psychotic features: Secondary | ICD-10-CM | POA: Diagnosis not present

## 2021-05-10 DIAGNOSIS — D709 Neutropenia, unspecified: Secondary | ICD-10-CM | POA: Diagnosis not present

## 2021-05-10 DIAGNOSIS — R197 Diarrhea, unspecified: Secondary | ICD-10-CM | POA: Diagnosis not present

## 2021-05-10 DIAGNOSIS — E440 Moderate protein-calorie malnutrition: Secondary | ICD-10-CM | POA: Diagnosis not present

## 2021-05-10 LAB — CULTURE, BLOOD (ROUTINE X 2)
Culture: NO GROWTH
Culture: NO GROWTH
Special Requests: ADEQUATE

## 2021-05-10 LAB — CBC
HCT: 28.3 % — ABNORMAL LOW (ref 36.0–46.0)
Hemoglobin: 9.1 g/dL — ABNORMAL LOW (ref 12.0–15.0)
MCH: 32.7 pg (ref 26.0–34.0)
MCHC: 32.2 g/dL (ref 30.0–36.0)
MCV: 101.8 fL — ABNORMAL HIGH (ref 80.0–100.0)
Platelets: 229 10*3/uL (ref 150–400)
RBC: 2.78 MIL/uL — ABNORMAL LOW (ref 3.87–5.11)
RDW: 24 % — ABNORMAL HIGH (ref 11.5–15.5)
WBC: 8.3 10*3/uL (ref 4.0–10.5)
nRBC: 1.7 % — ABNORMAL HIGH (ref 0.0–0.2)

## 2021-05-10 LAB — BASIC METABOLIC PANEL
Anion gap: 5 (ref 5–15)
BUN: <5 mg/dL — ABNORMAL LOW (ref 6–20)
CO2: 27 mmol/L (ref 22–32)
Calcium: 6.5 mg/dL — ABNORMAL LOW (ref 8.9–10.3)
Chloride: 106 mmol/L (ref 98–111)
Creatinine, Ser: 0.31 mg/dL — ABNORMAL LOW (ref 0.44–1.00)
GFR, Estimated: >60 mL/min (ref >60)
Glucose, Bld: 104 mg/dL — ABNORMAL HIGH (ref 70–99)
Potassium: 3.3 mmol/L — ABNORMAL LOW (ref 3.5–5.1)
Sodium: 138 mmol/L (ref 135–145)

## 2021-05-10 LAB — PHOSPHORUS: Phosphorus: 2.1 mg/dL — ABNORMAL LOW (ref 2.5–4.6)

## 2021-05-10 LAB — MAGNESIUM: Magnesium: 1.8 mg/dL (ref 1.7–2.4)

## 2021-05-10 MED ORDER — POTASSIUM PHOSPHATES 15 MMOLE/5ML IV SOLN
15.0000 mmol | Freq: Once | INTRAVENOUS | Status: AC
  Administered 2021-05-10: 15 mmol via INTRAVENOUS
  Filled 2021-05-10: qty 5

## 2021-05-10 MED ORDER — POTASSIUM CHLORIDE CRYS ER 20 MEQ PO TBCR
40.0000 meq | EXTENDED_RELEASE_TABLET | Freq: Once | ORAL | Status: AC
  Administered 2021-05-10: 40 meq via ORAL
  Filled 2021-05-10: qty 2

## 2021-05-10 MED ORDER — HYDROCORTISONE (PERIANAL) 2.5 % EX CREA
TOPICAL_CREAM | Freq: Two times a day (BID) | CUTANEOUS | Status: DC
  Filled 2021-05-10: qty 28.35

## 2021-05-10 MED ORDER — CALCIUM GLUCONATE-NACL 2-0.675 GM/100ML-% IV SOLN
2.0000 g | Freq: Once | INTRAVENOUS | Status: AC
  Administered 2021-05-10: 2000 mg via INTRAVENOUS
  Filled 2021-05-10: qty 100

## 2021-05-10 MED ORDER — MAGNESIUM SULFATE 2 GM/50ML IV SOLN
2.0000 g | Freq: Once | INTRAVENOUS | Status: AC
  Administered 2021-05-10: 2 g via INTRAVENOUS
  Filled 2021-05-10: qty 50

## 2021-05-10 NOTE — Progress Notes (Signed)
Pharmacy consulted to replenish electrolytes in this 57 yoF with metastatic breast cancer admitted 10/24 for nausea/vomiting/diarrhea.  Assessment K decreased to 3.3, no replacement yesterday (IVF dc'd) Ca 6.5, corrects to 7.86 with albumin 2.3 yesterday, given 2g IV yesterday  Mag 1.8, not checked or replaced yesterday Phos low at 2.1 but much improved from undetectable yesterday after  30 mmol NaPhos x 2; likely d/t refeeding and diarrhea  Plan KPhos 15 mmol (22 mEq potassium) IV x 1 KCl 67meq PO x 1 Repeat Ca gluconate 2g IV x 1 Magnesium Sulfate 2g IV x 1 Cmet, Mag, Phos in AM  Peggyann Juba, PharmD, BCPS Pharmacy: [id number] 05/10/2021, 7:39 AM

## 2021-05-10 NOTE — Progress Notes (Addendum)
PROGRESS NOTE    Monica [name]  EQA:[id number] DOB: 23-May-1971 DOA: 05/05/2021 PCP: Beverley Fiedler, FNP    Brief Narrative:  Monica [name] was admitted to the hospital with a working diagnosis of neutropenic fever.  50 year old female past medical history for osteoarthritis, depression and metastatic breast cancer who was admitted for suicidal ideation on 05/03/2021, to behavioral health.  At the psychiatric hospital patient was noted to have fever, abdominal pain, nausea and vomiting.  On her initial physical examination her temperature was 33.1 F, heart rate 117, respiratory 23, blood pressure 126/89, she had dry mucous membranes, lungs clear to auscultation bilaterally, heart S1-S2, present, tachycardic, abdomen was tender to palpation in the epigastric region, no lower extremity edema.  Sodium 133, potassium 3.1, chloride 101, bicarb 26, glucose 155, BUN 7, creatinine 0.45, lactic acid 2.8, white count 0.8, hemoglobin 10.2, hematocrit 31.1, platelets 131. SARS COVID-19 negative.  Patient was placed on intravenous fluids and antibiotic therapy. Patient received cefepime, 4 doses. Neutropenia resolved along with her fevers.  Her diet was resumed and he has been complicated with refeeding syndrome.   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 Patient with improvement of neutropenia, with wbc up to 8,3 and she has been afebrile.  Received cefepime with good toleration.  Continue close follow up on cell count. Follow up as outpatient with oncology.   2. Moderate calorie protein  malnutrition, hypokalemia, hypomagnesemia and hypophosphatemia due to refeeding syndrome.  Patient is tolerating po well, with no nausea or vomiting, no diarrhea.  Today K is 3,3, P 2,1 and Mg 1,8. Stable renal function with serum cr at 0,31.   Plan to continue electrolyte correction with K phos, Kcl and Mag sulfate. Follow up electrolytes in am.  3. Depression and suicidal ideation. Patient has been stable, no longer suicidal Will need outpatient follow up.  Continue with sertraline and mirtazapine  PRN lorazepam.   4. Chronic anemia. Hgb 9,1 and hct 28,3,  Likely due to malignancy.       DVT prophylaxis: Enoxaparin   Code Status:    full  Family Communication:  I spoke with patient's parents  at the bedside, we talked in detail about patient's condition, plan of care and prognosis and all questions were addressed.      Nutrition Status: Nutrition Problem: Moderate Malnutrition Etiology: chronic illness, cancer and cancer related treatments Signs/Symptoms: mild muscle depletion, energy intake < or equal to 75% for > or equal to 1 month, percent weight loss Interventions: Ensure Enlive (each supplement provides 350kcal and 20 grams of protein), MVI    Consultants:  Oncology  Psychiatry     Subjective: Patient is feeling better, no nausea or vomiting, diarrhea resolved and tolerating po well.   Objective: Vitals:   05/09/21 1335 05/09/21 2022 05/10/21 0547 05/10/21 1344  BP: 110/88 (!) 121/91 (!) 122/91 119/86  Pulse: 82 91 80 94  Resp:  18 16 15   Temp: 98.2 F (36.8 C) 98.4 F (36.9 C) 98.8 F (37.1 C) 98.5 F (36.9 C)  TempSrc: Oral Oral Oral Oral  SpO2: 99% 98% 94% 97%  Weight:  Height:        Intake/Output Summary (Last 24 hours) at 05/10/2021 1555 Last data filed at 05/10/2021 1500 Gross per 24 hour  Intake 800.21 ml  Output 1200 ml  Net -399.79 ml   Filed Weights   05/06/21 0248 05/06/21 1558  Weight: 66.7 kg 72.4 kg    Examination:    General: Not in pain or dyspnea Neurology: Awake and alert, non focal  E ENT: no pallor, no icterus, oral mucosa moist Cardiovascular: No JVD. S1-S2 present, rhythmic, no gallops, rubs, or murmurs. No lower extremity edema. Pulmonary:  positive breath sounds bilaterally, adequate air movement, no wheezing, rhonchi or rales. Gastrointestinal. Abdomen soft and non tender Skin. No rashes Musculoskeletal: no joint deformities     Data Reviewed: I have personally reviewed following labs and imaging studies  CBC: Recent Labs  Lab 05/05/21 0658 05/06/21 0530 05/07/21 0503 05/08/21 0553 05/09/21 0722 05/10/21 0500  WBC 0.8* 1.6* 3.7* 5.5 7.6 8.3  NEUTROABS 0.3* 1.3* 2.5 3.5  --   --   HGB 10.2* 10.1* 9.3* 9.0* 9.3* 9.1*  HCT 31.1* 30.6* 28.5* 27.4* 28.3* 28.3*  MCV 103.3* 100.3* 101.1* 101.5* 101.8* 101.8*  PLT 131* 145* 157 172 207 903   Basic Metabolic Panel: Recent Labs  Lab 05/05/21 1150 05/06/21 0530 05/07/21 0503 05/07/21 1738 05/07/21 1745 05/08/21 0553 05/09/21 0722 05/10/21 0500  NA  --    < > 135 139  --  138 139 138  K  --    < > 2.6* 2.6*  --  3.0* 4.0 3.3*  CL  --    < > 107 110  --  109 108 106  CO2  --    < > 21* 23  --  24 26 27   GLUCOSE  --    < > 109* 109*  --  100* 118* 104*  BUN  --    < > 14 10  --  <5* <5* <5*  CREATININE  --    < > 0.45 0.46  --  <0.30* 0.45 0.31*  CALCIUM  --    < > 6.0* 6.4*  --  6.5* 6.7* 6.5*  MG 1.9  --   --   --   --  2.0  --  1.8  PHOS  --   --   --   --  <1.0* <1.0* <1.0* 2.1*   < > = values in this interval not displayed.   GFR: Estimated Creatinine Clearance: 84.9 mL/min (A) (by C-G formula based on SCr of 0.31 mg/dL (L)). Liver Function Tests: Recent Labs  Lab 05/06/21 0530 05/08/21 0553 05/09/21 0722  AST 21 15 18   ALT 26 18 18   ALKPHOS 125 110 145*  BILITOT 1.3* 0.8 0.7  PROT 5.4* 4.4* 4.7*  ALBUMIN 2.5* 2.1* 2.3*   No results for input(s): LIPASE, AMYLASE in the last 168 hours. No results for  input(s): AMMONIA in the last 168 hours. Coagulation Profile: No results for input(s): INR, PROTIME in the last 168 hours. Cardiac Enzymes: No results for input(s): CKTOTAL, CKMB, CKMBINDEX, TROPONINI in the last 168 hours. BNP (last 3 results) No results for input(s): PROBNP in the last 8760 hours. HbA1C: No results for input(s): HGBA1C in the last 72 hours. CBG: No results for input(s): GLUCAP in the last 168 hours. Lipid Profile: No results for input(s): CHOL, HDL, LDLCALC, TRIG, CHOLHDL, LDLDIRECT in the last 72 hours. Thyroid Function Tests: No results for input(s): TSH, T4TOTAL, FREET4, T3FREE, THYROIDAB in  the last 72 hours. Anemia Panel: No results for input(s): VITAMINB12, FOLATE, FERRITIN, TIBC, IRON, RETICCTPCT in the last 72 hours.    Radiology Studies: I have reviewed all of the imaging during this hospital visit personally     Scheduled Meds:  Chlorhexidine Gluconate Cloth  6 each Topical Daily   cholestyramine light  4 g Oral TID   enoxaparin (LOVENOX) injection  40 mg Subcutaneous Q24H   feeding supplement  237 mL Oral TID BM   fluconazole  100 mg Oral Daily   gabapentin  300 mg Oral TID   hydrocortisone   Rectal BID   ketotifen  1 drop Both Eyes BID   LORazepam  0.25 mg Oral TID AC & HS   mirtazapine  30 mg Oral QHS   multivitamin with minerals  1 tablet Oral Daily   ondansetron (ZOFRAN) IV  4 mg Intravenous TID AC & HS   Or   ondansetron  4 mg Oral TID AC & HS   pantoprazole  40 mg Oral Daily   sertraline  50 mg Oral Daily   sodium chloride flush  10-40 mL Intracatheter Q12H   tucatinib  150 mg Oral BID   valACYclovir  1,000 mg Oral Daily   Continuous Infusions:  potassium PHOSPHATE IVPB (in mmol) 15 mmol (05/10/21 1536)     LOS: 5 days        Dominica Kent Gerome Apley, MD

## 2021-05-11 DIAGNOSIS — E876 Hypokalemia: Secondary | ICD-10-CM | POA: Diagnosis not present

## 2021-05-11 DIAGNOSIS — R197 Diarrhea, unspecified: Secondary | ICD-10-CM | POA: Diagnosis not present

## 2021-05-11 DIAGNOSIS — D709 Neutropenia, unspecified: Secondary | ICD-10-CM | POA: Diagnosis not present

## 2021-05-11 DIAGNOSIS — F332 Major depressive disorder, recurrent severe without psychotic features: Secondary | ICD-10-CM | POA: Diagnosis not present

## 2021-05-11 LAB — COMPREHENSIVE METABOLIC PANEL
ALT: 18 U/L (ref 0–44)
AST: 21 U/L (ref 15–41)
Albumin: 2.4 g/dL — ABNORMAL LOW (ref 3.5–5.0)
Alkaline Phosphatase: 188 U/L — ABNORMAL HIGH (ref 38–126)
Anion gap: 4 — ABNORMAL LOW (ref 5–15)
BUN: <5 mg/dL — ABNORMAL LOW (ref 6–20)
CO2: 27 mmol/L (ref 22–32)
Calcium: 6.9 mg/dL — ABNORMAL LOW (ref 8.9–10.3)
Chloride: 106 mmol/L (ref 98–111)
Creatinine, Ser: 0.37 mg/dL — ABNORMAL LOW (ref 0.44–1.00)
GFR, Estimated: >60 mL/min (ref >60)
Glucose, Bld: 99 mg/dL (ref 70–99)
Potassium: 4.2 mmol/L (ref 3.5–5.1)
Sodium: 137 mmol/L (ref 135–145)
Total Bilirubin: 0.7 mg/dL (ref 0.3–1.2)
Total Protein: 5.2 g/dL — ABNORMAL LOW (ref 6.5–8.1)

## 2021-05-11 LAB — PHOSPHORUS: Phosphorus: 1.6 mg/dL — ABNORMAL LOW (ref 2.5–4.6)

## 2021-05-11 LAB — MAGNESIUM: Magnesium: 1.9 mg/dL (ref 1.7–2.4)

## 2021-05-11 MED ORDER — FLUCONAZOLE 100 MG PO TABS: 100.0000 mg | ORAL_TABLET | Freq: Every day | ORAL | 0 refills | Status: AC

## 2021-05-11 MED ORDER — SODIUM PHOSPHATES 45 MMOLE/15ML IV SOLN
30.0000 mmol | Freq: Once | INTRAVENOUS | Status: AC
  Administered 2021-05-11: 30 mmol via INTRAVENOUS
  Filled 2021-05-11: qty 10

## 2021-05-11 MED ORDER — ADULT MULTIVITAMIN W/MINERALS CH: 1.0000 | ORAL_TABLET | Freq: Every day | ORAL | Status: DC

## 2021-05-11 MED ORDER — CALCIUM GLUCONATE-NACL 1-0.675 GM/50ML-% IV SOLN
1.0000 g | Freq: Once | INTRAVENOUS | Status: AC
  Administered 2021-05-11: 1000 mg via INTRAVENOUS
  Filled 2021-05-11: qty 50

## 2021-05-11 MED ORDER — MAGNESIUM SULFATE IN D5W 1-5 GM/100ML-% IV SOLN
1.0000 g | Freq: Once | INTRAVENOUS | Status: AC
  Administered 2021-05-11: 1 g via INTRAVENOUS
  Filled 2021-05-11: qty 100

## 2021-05-11 MED ORDER — SODIUM PHOSPHATES 45 MMOLE/15ML IV SOLN
30.0000 mmol | Freq: Once | INTRAVENOUS | Status: DC
  Filled 2021-05-11: qty 10

## 2021-05-11 MED ORDER — OXYCODONE HCL 5 MG PO TABS
5.0000 mg | ORAL_TABLET | Freq: Once | ORAL | Status: AC
  Administered 2021-05-11: 5 mg via ORAL
  Filled 2021-05-11: qty 1

## 2021-05-11 MED ORDER — ENSURE ENLIVE PO LIQD: 237.0000 mL | Freq: Three times a day (TID) | ORAL | 0 refills | Status: AC

## 2021-05-11 NOTE — Progress Notes (Addendum)
The patient is complaining of pain on the chest when she breath in. She said her pain is 5 and when she breath in is 10/10 on a pain scale. She has only Tylenol PRN and stated it doesn't work for her so she wants something stronger. Notified Dr. [place] Crane and awaiting for a new order.

## 2021-05-11 NOTE — Progress Notes (Addendum)
New order for Oxycodone 5 mg oral x one dose received form Dr. [place] Crane. Will implement order as received.

## 2021-05-11 NOTE — Progress Notes (Signed)
Pharmacy consulted to replenish electrolytes in this 68 yoF with metastatic breast cancer admitted 10/24 for nausea/vomiting/diarrhea.  Assessment K improved 4.2 after IV and PO replacement yesterday Ca 6.9, corrects to 8.18 with albumin 2.4, given 2g IV yesterday  Mag 1.9, 2g IV given yesterday Phos decreased 1.6 despite 15 mmol KPhos x 1 yesterday and 68mmol 10/28, likely d/t refeeding and diarrhea  Plan NaPhos 30 mmol IV x 1 Ca gluconate 1g IV x 1 Magnesium Sulfate 1g IV x 1 Cmet, Mag, Phos in AM  Peggyann Juba, PharmD, [place]: [phone] 05/11/2021, 8:36 AM

## 2021-05-11 NOTE — TOC Progression Note (Signed)
Transition of Care [facility]) - Progression Note    Patient Details  Name: Monica [name] MRN: [id number] Date of Birth: 22-Apr-1971  [facility] CM/SW Contact  Cecil Cobbs Phone Number: 05/10/21, 10:17 AM  Clinical Narrative:    Psych saw patient and cleared patient.  Patient will set up her own outpatient psych care.        Expected Discharge Plan and Services  Plan to discharge home with family.         Expected Discharge Date: 05/11/21                                     Social Determinants of Health (SDOH) Interventions    Readmission Risk Interventions Readmission Risk Prevention Plan 01/21/2021  Transportation Screening Complete  PCP or Specialist Appt within 3-5 Days Not Complete  Not Complete comments plans to transfer to tertiary care center  [facility] or [place] Complete  Social Work Consult for [place] Planning/Counseling Complete  Palliative Care Screening Not Applicable  Medication Review [employer]) Complete  Some recent data might be hidden

## 2021-05-11 NOTE — Discharge Summary (Addendum)
Physician Discharge Summary  Monica [name] PPI:[id number] DOB: 1970/11/30 DOA: 05/05/2021  PCP: Beverley Fiedler, FNP  Admit date: 05/05/2021 Discharge date: 05/11/2021  Admitted From: Home  Disposition:  home   Recommendations for Outpatient Follow-up and new medication changes:  Follow up with Beverley Fiedler FNP in 7 to 10 days.  Follow up with Psychiatry as outpatient, referral for partial hospitalization programming and or intensive outpatient programming unit in [place]. Follow up electrolytes as outpatient.  Follow up with oncology as outpatient in [place].   Home Health: no   Equipment/Devices: no    Discharge Condition: stable  CODE STATUS: full  Diet recommendation:  regular   Brief/Interim Summary: Monica [name] was admitted to the hospital with a working diagnosis of neutropenic fever.   50 year old female with the past medical history for osteoarthritis, depression and metastatic breast cancer who was admitted for suicidal ideation on 05/03/2021, to behavioral health.  At the psychiatric ward patient was noted to have fever, abdominal pain, nausea and vomiting.  On her initial physical examination her temperature was 33.1 F, heart rate 117, respiratory rate 23, blood pressure 126/89, she had dry mucous membranes, lungs clear to auscultation bilaterally, heart S1-S2, present, tachycardic, abdomen was tender to palpation in the epigastric region, no lower extremity edema.   Sodium 133, potassium 3.1, chloride 101, bicarb 26, glucose 155, BUN 7, creatinine 0.45, lactic acid 2.8, white count 0.8, hemoglobin 10.2, hematocrit 31.1, platelets 131. SARS COVID-19 negative.   Patient was placed on intravenous fluids and antibiotic therapy. Patient received cefepime, 4 doses. Neutropenia resolved along with her fevers.   Her diet was resumed and he has been complicated with refeeding syndrome.  Psychiatry recommended outpatient follow up.   Neutropenic fever, right  breast cancer, invasive ductal carcinoma stage Ia, metastasis to brain, liver, lungs and bones. Patient received antibiotic therapy with cefepime with good toleration.  Her neutropenia and fever resolved. Blood cultures with no growth.  Antibiotic therapy was discontinued.  Patient was seen by oncology, patient will follow-up with further breast cancer treatment near her home in [place], her family is arranging an appointment for next week.  2.  Hypokalemia, hypomagnesemia, hypocalemia, hypophosphatemia due to diarrhea and refeeding syndrome.  Her electrolytes were corrected with good toleration.  Her diarrhea slowly improved. At time of her discharge she is tolerating p.o. diet adequately. Her sodium is 137, potassium 4.2, chloride 106, bicarb 27, glucose 99, BUN less than 5, creatinine 0.37, calcium 6.9 (corrected for albumin 8.2), phosphorus 1.6 and magnesium 1.9.  Patient will follow-up electrolytes as an outpatient.  3.  Depression, suicidal ideation.  Patient was evaluated by psychiatry, initially on suicidal precautions. Clinically improved, she will continue taking sertraline, mirtazapine and as needed lorazepam. At the time of her discharge she is no longer on suicidal precautions. Recommendations to follow-up as an outpatient.  4.  Chronic anemia/ iron deficiency/ moderate calorie malnutrition.  Anemia likely related to malignancy.  Her discharge hemoglobin is 9.1, hematocrit 28.3. Continue with oral iron supplementation.   Continue with multivitamins and nutritional supplementation.   Discharge Diagnoses:  Principal Problem:   Febrile neutropenia ([place]) Active Problems:   Malignant neoplasm of upper-inner quadrant of right breast in female, estrogen receptor positive ([place])   Hypokalemia   Suicidal ideation   Major depressive disorder   Diarrhea   Malnutrition of moderate degree   Refeeding syndrome    Discharge Instructions   Allergies as of 05/11/2021   No Known  Allergies  Medication List     STOP taking these medications    pantoprazole 40 MG tablet Commonly known as: PROTONIX   polyethylene glycol 17 g packet Commonly known as: MIRALAX / GLYCOLAX       TAKE these medications    acetaminophen 500 MG tablet Commonly known as: TYLENOL Take 1 tablet (500 mg total) by mouth 3 (three) times daily as needed for mild pain (or Fever >/= 101). Take with ibuprofen 400 mg   b complex vitamins capsule Take 1 capsule by mouth daily. 900 mg   capecitabine 500 MG tablet Commonly known as: XELODA Take 1,500 mg by mouth 2 (two) times daily. Takes daily for 21 days, then 7 days off, then repeat cycle   dexamethasone 4 MG tablet Commonly known as: DECADRON Take 2 tablets (8 mg total) by mouth daily. Start the day after chemotherapy for 2 days.   feeding supplement Liqd Take 237 mLs by mouth 3 (three) times daily between meals.   ferrous sulfate 325 (65 FE) MG tablet Take 325 mg by mouth daily with breakfast.   fluconazole 100 MG tablet Commonly known as: DIFLUCAN Take 1 tablet (100 mg total) by mouth daily for 15 days.   gabapentin 300 MG capsule Commonly known as: NEURONTIN Take 1 capsule (300 mg total) by mouth 3 (three) times daily.   ibuprofen 200 MG tablet Commonly known as: ADVIL Take 400 mg by mouth 3 (three) times daily as needed for mild pain (take with tylenol).   mirtazapine 30 MG tablet Commonly known as: REMERON Take 1 tablet (30 mg total) by mouth at bedtime. What changed:  medication strength how much to take   multivitamin with minerals Tabs tablet Take 1 tablet by mouth daily.   omeprazole 40 MG capsule Commonly known as: PRILOSEC Take 40 mg by mouth daily.   ondansetron 4 MG disintegrating tablet Commonly known as: Zofran ODT Take 1 tablet (4 mg total) by mouth See admin instructions. Dissolve 4 mg in the mouth three times a day and an additional 4 mg at bedtime as needed for nausea/vomiting What  changed:  when to take this reasons to take this additional instructions   pyridOXINE 100 MG tablet Commonly known as: VITAMIN B-6 Take 100 mg by mouth daily.   sertraline 50 MG tablet Commonly known as: ZOLOFT Take 1 tablet (50 mg total) by mouth daily. What changed:  medication strength how much to take   Tukysa 150 MG tablet Generic drug: tucatinib Take 150 mg by mouth 2 (two) times daily.   valACYclovir 1000 MG tablet Commonly known as: VALTREX Take 1 tablet (1,000 mg total) by mouth 2 (two) times daily. Take one tablet twice a day for one week, then daily What changed:  when to take this additional instructions   Vitamin D 125 MCG (5000 UT) Caps Take 5,000 Units by mouth daily.        Follow-up Information     [facility]. Schedule an appointment as soon as possible for a visit in 2 day(s).   Why: F/U depression and suicidal thoughts        [facility]. Schedule an appointment as soon as possible for a visit in 3 day(s).   Why: For Partial Hospitalization and/ot Intensive Outpatient Programming, Cognitive behavioral therapy. Contact information: [place] [address] [phone]        Suicide Hotline. Call.   Why: As needed, If suicidal thoughts worsen Contact information: Call [phone]  No Known Allergies  Consultations: Oncology Psychiatry    Procedures/Studies: DG Chest 2 View  Result Date: 05/03/2021 CLINICAL DATA:  Shortness of breath. Weakness and fatigue. Breast cancer with active chemotherapy. EXAM: CHEST - 2 VIEW COMPARISON:  Radiograph 11/27/2020, CT 04/14/2021 FINDINGS: Right chest port with tip in the SVC. Nodular opacity in the right mid lung just inferior to the site of port, corresponds to nodular opacities on CT. The lungs are otherwise clear. No pulmonary edema, pleural effusion, or pneumothorax. Slight volume loss in the right hemithorax is stable from prior CT.  Surgical clips in the right axilla. Kyphoplasty within T9 and T10. Similar compression fractures in the thoracic spine. Osseous metastatic disease with patchy areas of sclerosis, assessed on recent CT. IMPRESSION: 1. Stable nodular consolidation in the right mid lung compared with recent CT. 2. No acute intrathoracic findings. 3. Osseous metastatic disease. Electronically Signed   By: Keith Rake M.D.   On: 05/03/2021 18:35   CT Chest W Contrast  Result Date: 04/15/2021 CLINICAL DATA:  Breast cancer. Chemotherapy and radiation and 2 weeks ago. EXAM: CT CHEST WITH CONTRAST TECHNIQUE: Multidetector CT imaging of the chest was performed during intravenous contrast administration. CONTRAST:  69mL OMNIPAQUE IOHEXOL 350 MG/ML SOLN COMPARISON:  11/30/2020. FINDINGS: Cardiovascular: Right IJ Port-A-Cath terminates in the right atrium. Heart size normal. Small pericardial effusion is new. Mediastinum/Nodes: Low internal jugular lymph nodes are not enlarged by CT size criteria. No pathologically enlarged mediastinal, hilar, internal mammary or axillary lymph nodes. Surgical clips in the right axilla. Esophagus is grossly unremarkable. Lungs/Pleura: Mild biapical pleuroparenchymal scarring. Nodular consolidation in the right middle lobe has progressed but is difficult to measure given adjacent volume loss. Surrounding peribronchovascular nodularity. Additional bilateral pulmonary nodules measure up to 1.5 cm in the right lower lobe (5/101), similar. No pleural fluid. Airway is unremarkable. Upper Abdomen: New and enlarging heterogeneous lesions in the liver. Index lesion in the central aspect of the liver, spanning the left and right hepatic lobes, now measures 5.8 x 6.2 cm (2/128), previously 4.0 x 5.0 cm. Enlarged portal vein. Visualized portions of adrenal glands, kidneys, spleen, pancreas, stomach and bowel are grossly unremarkable. Musculoskeletal: Progressive mottled sclerosis throughout the visualized osseous  structures. New pathologic fractures of the left second, sixth and eighth ribs. T9 and T10 vertebral body augmentations. New compression deformities involving T1, T2, T4, T6 and T7. Progressive compression deformities involving T11 and T12. L1 inferior endplate compression fracture and L2 superior endplate compression fracture, new. Pathologic manubrial fracture. IMPRESSION: 1. Interval progression of hepatic and osseous metastatic disease with multiple new pathologic fractures throughout the axial and appendicular skeleton. 2. Increasing nodular consolidation in the right middle lobe, also worrisome for some for disease progression 3. New small pericardial effusion. Electronically Signed   By: Lorin Picket M.D.   On: 04/15/2021 11:16   MR BRAIN W WO CONTRAST  Result Date: 05/07/2021 CLINICAL DATA:  Breast cancer, assess treatment response; follow brain metastases. EXAM: MRI HEAD WITHOUT AND WITH CONTRAST TECHNIQUE: Multiplanar, multiecho pulse sequences of the brain and surrounding structures were obtained without and with intravenous contrast. CONTRAST:  101mL GADAVIST GADOBUTROL 1 MMOL/ML IV SOLN COMPARISON:  MRI of the brain March 06, 2021. FINDINGS: Brain: No acute infarction, hemorrhage, hydrocephalus or extra-axial collection. Postsurgical changes from right temporal lobe lesion resection with residual marginal contrast enhancement in the surgical cavity, stable to mildly decreased from prior MRI. Three enhancing parenchymal lesions have decreased in size when compared to prior MRI: * Right  pre frontal soft gyrus (series 16, image 131), 4 mm, compared to 6 mm on prior. * Left superior frontal gyrus (series 18, image 15), 2 mm, compared to 3 mm on prior. * Left temporal lobe (series 16, image 57) 2 mm, compared to 4 mm on prior. A right occipital lobe (series 16, image 92; series 18, image 8), has similar size when compared to prior MRI, measuring 10 x 6 mm but with decreased degree of contrast  enhancement. A left cerebellar hemisphere lesion has grown from 2 mm to 4 mm in the current study (series 16, image 37). Questionable new punctate focus of contrast enhancement in the left frontal lobe in the anterior orbital gyrus (series 16, image 79). Vascular: Normal flow voids. Skull and upper cervical spine: Status post right temporal craniotomy. Interval increase in size of the osseous lesions involving the bilateral frontal bones, left parietal bone, left mastoid process, left lateral aspect of the C1 anterior arch and left mandibular condyle. Diffuse decreased T1 signal of the visualized upper cervical spine with contrast enhancement, consistent with metastatic disease appear similar. Sinuses/Orbits: Negative. Other: None. IMPRESSION: 1. Mixed interval response of parenchymal lesions with interval decrease in size of 3 supratentorial lesions, 1 stable supratentorial lesion and interval growth of 1 cerebellar lesion with questionable punctate new lesion in the left frontal lobe. 2. Interval progression of the osseous metastatic lesions, as described above. Electronically Signed   By: Pedro Earls M.D.   On: 05/07/2021 13:56      Subjective: Patient is feeling better, no nausea or vomiting, diarrhea has been improving, no chest pain or dyspnea.,   Discharge Exam: Vitals:   05/10/21 2102 05/11/21 0306  BP: 114/82 112/84  Pulse: 98 (!) 105  Resp: 18 16  Temp: 98.6 F (37 C) 98.6 F (37 C)  SpO2: 97% 96%   Vitals:   05/10/21 0547 05/10/21 1344 05/10/21 2102 05/11/21 0306  BP: (!) 122/91 119/86 114/82 112/84  Pulse: 80 94 98 (!) 105  Resp: 16 15 18 16   Temp: 98.8 F (37.1 C) 98.5 F (36.9 C) 98.6 F (37 C) 98.6 F (37 C)  TempSrc: Oral Oral Oral Oral  SpO2: 94% 97% 97% 96%  Weight:      Height:        General: Not in pain or dyspnea  Neurology: Awake and alert, non focal  E ENT: no pallor, no icterus, oral mucosa moist Cardiovascular: No JVD. S1-S2 present,  rhythmic, no gallops, rubs, or murmurs. No lower extremity edema. Pulmonary: positive breath sounds bilaterally, adequate air movement, no wheezing, rhonchi or rales. Gastrointestinal. Abdomen soft and non tender Skin. No rashes Musculoskeletal: no joint deformities   The results of significant diagnostics from this hospitalization (including imaging, microbiology, ancillary and laboratory) are listed below for reference.     Microbiology: Recent Results (from the past 240 hour(s))  Resp Panel by RT-PCR (Flu A&B, Covid) Nasopharyngeal Swab     Status: None   Collection Time: 05/01/21 10:39 AM   Specimen: Nasopharyngeal Swab; Nasopharyngeal(NP) swabs in vial transport medium  Result Value Ref Range Status   SARS Coronavirus 2 by RT PCR NEGATIVE NEGATIVE Final    Comment: (NOTE) SARS-CoV-2 target nucleic acids are NOT DETECTED.  The SARS-CoV-2 RNA is generally detectable in upper respiratory specimens during the acute phase of infection. The lowest concentration of SARS-CoV-2 viral copies this assay can detect is 138 copies/mL. A negative result does not preclude SARS-Cov-2 infection and should not be  used as the sole basis for treatment or other patient management decisions. A negative result may occur with  improper specimen collection/handling, submission of specimen other than nasopharyngeal swab, presence of viral mutation(s) within the areas targeted by this assay, and inadequate number of viral copies(<138 copies/mL). A negative result must be combined with clinical observations, patient history, and epidemiological information. The expected result is Negative.  Fact Sheet for Patients:  EntrepreneurPulse.com.au  Fact Sheet for Healthcare Providers:  IncredibleEmployment.be  This test is no t yet approved or cleared by the [place] FDA and  has been authorized for detection and/or diagnosis of SARS-CoV-2 by FDA under an Emergency  Use Authorization (EUA). This EUA will remain  in effect (meaning this test can be used) for the duration of the COVID-19 declaration under Section 564(b)(1) of the Act, 21 U.S.C.section 360bbb-3(b)(1), unless the authorization is terminated  or revoked sooner.       Influenza A by PCR NEGATIVE NEGATIVE Final   Influenza B by PCR NEGATIVE NEGATIVE Final    Comment: (NOTE) The Xpert Xpress SARS-CoV-2/FLU/RSV plus assay is intended as an aid in the diagnosis of influenza from Nasopharyngeal swab specimens and should not be used as a sole basis for treatment. Nasal washings and aspirates are unacceptable for Xpert Xpress SARS-CoV-2/FLU/RSV testing.  Fact Sheet for Patients: EntrepreneurPulse.com.au  Fact Sheet for Healthcare Providers: IncredibleEmployment.be  This test is not yet approved or cleared by the [place] FDA and has been authorized for detection and/or diagnosis of SARS-CoV-2 by FDA under an Emergency Use Authorization (EUA). This EUA will remain in effect (meaning this test can be used) for the duration of the COVID-19 declaration under Section 564(b)(1) of the Act, 21 U.S.C. section 360bbb-3(b)(1), unless the authorization is terminated or revoked.  Performed at [facility], [place] [address], [place], [place] [address]   Blood culture (routine x 2)     Status: None   Collection Time: 05/05/21  9:55 AM   Specimen: BLOOD  Result Value Ref Range Status   Specimen Description   Final    BLOOD RIGHT ANTECUBITAL Performed at [facility] [address], [place], [place] [address]    Special Requests   Final    BOTTLES DRAWN AEROBIC AND ANAEROBIC Blood Culture results may not be optimal due to an inadequate volume of blood received in culture bottles Performed at [facility] [address] [place], [place] [address]    Culture   Final    NO GROWTH 5 DAYS Performed at  [facility], [place] [address] [place], [place] [address]    Report Status 05/10/2021 FINAL  Final  Blood culture (routine x 2)     Status: None   Collection Time: 05/05/21 10:42 AM   Specimen: BLOOD  Result Value Ref Range Status   Specimen Description   Final    BLOOD LEFT ANTECUBITAL Performed at [facility] [address], [place], [place] [address]    Special Requests   Final    BOTTLES DRAWN AEROBIC AND ANAEROBIC Blood Culture adequate volume Performed at [facility] [address], [place], [place] [address]    Culture   Final    NO GROWTH 5 DAYS Performed at [facility], [place] [address] [place], [place] [address]    Report Status 05/10/2021 FINAL  Final  Resp Panel by RT-PCR (Flu A&B, Covid) Nasopharyngeal Swab     Status: None  Collection Time: 05/05/21  1:46 PM   Specimen: Nasopharyngeal Swab; Nasopharyngeal(NP) swabs in vial transport medium  Result Value Ref Range Status   SARS Coronavirus 2 by RT PCR NEGATIVE NEGATIVE Final    Comment: (NOTE) SARS-CoV-2 target nucleic acids are NOT DETECTED.  The SARS-CoV-2 RNA is generally detectable in upper respiratory specimens during the acute phase of infection. The lowest concentration of SARS-CoV-2 viral copies this assay can detect is 138 copies/mL. A negative result does not preclude SARS-Cov-2 infection and should not be used as the sole basis for treatment or other patient management decisions. A negative result may occur with  improper specimen collection/handling, submission of specimen other than nasopharyngeal swab, presence of viral mutation(s) within the areas targeted by this assay, and inadequate number of viral copies(<138 copies/mL). A negative result must be combined with clinical observations, patient history, and epidemiological information. The expected result is Negative.  Fact Sheet for Patients:  EntrepreneurPulse.com.au  Fact  Sheet for Healthcare Providers:  IncredibleEmployment.be  This test is no t yet approved or cleared by the [place] FDA and  has been authorized for detection and/or diagnosis of SARS-CoV-2 by FDA under an Emergency Use Authorization (EUA). This EUA will remain  in effect (meaning this test can be used) for the duration of the COVID-19 declaration under Section 564(b)(1) of the Act, 21 U.S.C.section 360bbb-3(b)(1), unless the authorization is terminated  or revoked sooner.       Influenza A by PCR NEGATIVE NEGATIVE Final   Influenza B by PCR NEGATIVE NEGATIVE Final    Comment: (NOTE) The Xpert Xpress SARS-CoV-2/FLU/RSV plus assay is intended as an aid in the diagnosis of influenza from Nasopharyngeal swab specimens and should not be used as a sole basis for treatment. Nasal washings and aspirates are unacceptable for Xpert Xpress SARS-CoV-2/FLU/RSV testing.  Fact Sheet for Patients: EntrepreneurPulse.com.au  Fact Sheet for Healthcare Providers: IncredibleEmployment.be  This test is not yet approved or cleared by the [place] FDA and has been authorized for detection and/or diagnosis of SARS-CoV-2 by FDA under an Emergency Use Authorization (EUA). This EUA will remain in effect (meaning this test can be used) for the duration of the COVID-19 declaration under Section 564(b)(1) of the Act, 21 U.S.C. section 360bbb-3(b)(1), unless the authorization is terminated or revoked.  Performed at [facility], [place] [address] [place], [place] [address]   MRSA Next Gen by PCR, Nasal     Status: None   Collection Time: 05/05/21  4:14 PM   Specimen: Nasal Mucosa; Nasal Swab  Result Value Ref Range Status   MRSA by PCR Next Gen NOT DETECTED NOT DETECTED Final    Comment: (NOTE) The GeneXpert MRSA Assay (FDA approved for NASAL specimens only), is one component of a comprehensive MRSA colonization  surveillance program. It is not intended to diagnose MRSA infection nor to guide or monitor treatment for MRSA infections. Test performance is not FDA approved in patients less than 67 years old. Performed at [facility], [place] [address], [place], [place] [address]   C Difficile Quick Screen w PCR reflex     Status: None   Collection Time: 05/06/21 10:10 AM   Specimen: STOOL  Result Value Ref Range Status   C Diff antigen NEGATIVE NEGATIVE Final   C Diff toxin NEGATIVE NEGATIVE Final   C Diff interpretation No C. difficile detected.  Final    Comment: Performed at [facility], [place] [address] [place],  [address]  Labs: BNP (last 3 results) No results for input(s): BNP in the last 8760 hours. Basic Metabolic Panel: Recent Labs  Lab 05/05/21 1150 05/06/21 0530 05/07/21 1738 05/07/21 1745 05/08/21 0553 05/09/21 0722 05/10/21 0500 05/11/21 0535  NA  --    < > 139  --  138 139 138 137  K  --    < > 2.6*  --  3.0* 4.0 3.3* 4.2  CL  --    < > 110  --  109 108 106 106  CO2  --    < > 23  --  24 26 27 27   GLUCOSE  --    < > 109*  --  100* 118* 104* 99  BUN  --    < > 10  --  <5* <5* <5* <5*  CREATININE  --    < > 0.46  --  <0.30* 0.45 0.31* 0.37*  CALCIUM  --    < > 6.4*  --  6.5* 6.7* 6.5* 6.9*  MG 1.9  --   --   --  2.0  --  1.8 1.9  PHOS  --   --   --  <1.0* <1.0* <1.0* 2.1* 1.6*   < > = values in this interval not displayed.   Liver Function Tests: Recent Labs  Lab 05/06/21 0530 05/08/21 0553 05/09/21 0722 05/11/21 0535  AST 21 15 18 21   ALT 26 18 18 18   ALKPHOS 125 110 145* 188*  BILITOT 1.3* 0.8 0.7 0.7  PROT 5.4* 4.4* 4.7* 5.2*  ALBUMIN 2.5* 2.1* 2.3* 2.4*   No results for input(s): LIPASE, AMYLASE in the last 168 hours. No results for input(s): AMMONIA in the last 168 hours. CBC: Recent Labs  Lab 05/05/21 0658 05/06/21 0530 05/07/21 0503 05/08/21 0553 05/09/21 0722 05/10/21 0500  WBC 0.8* 1.6* 3.7* 5.5  7.6 8.3  NEUTROABS 0.3* 1.3* 2.5 3.5  --   --   HGB 10.2* 10.1* 9.3* 9.0* 9.3* 9.1*  HCT 31.1* 30.6* 28.5* 27.4* 28.3* 28.3*  MCV 103.3* 100.3* 101.1* 101.5* 101.8* 101.8*  PLT 131* 145* 157 172 207 229   Cardiac Enzymes: No results for input(s): CKTOTAL, CKMB, CKMBINDEX, TROPONINI in the last 168 hours. BNP: Invalid input(s): POCBNP CBG: No results for input(s): GLUCAP in the last 168 hours. D-Dimer No results for input(s): DDIMER in the last 72 hours. Hgb A1c No results for input(s): HGBA1C in the last 72 hours. Lipid Profile No results for input(s): CHOL, HDL, LDLCALC, TRIG, CHOLHDL, LDLDIRECT in the last 72 hours. Thyroid function studies No results for input(s): TSH, T4TOTAL, T3FREE, THYROIDAB in the last 72 hours.  Invalid input(s): FREET3 Anemia work up No results for input(s): VITAMINB12, FOLATE, FERRITIN, TIBC, IRON, RETICCTPCT in the last 72 hours. Urinalysis    Component Value Date/Time   COLORURINE YELLOW 05/04/2021 1629   APPEARANCEUR HAZY (A) 05/04/2021 1629   LABSPEC 1.016 05/04/2021 1629   PHURINE 5.0 05/04/2021 1629   GLUCOSEU NEGATIVE 05/04/2021 1629   HGBUR NEGATIVE 05/04/2021 1629   BILIRUBINUR NEGATIVE 05/04/2021 1629   KETONESUR NEGATIVE 05/04/2021 1629   PROTEINUR NEGATIVE 05/04/2021 1629   NITRITE POSITIVE (A) 05/04/2021 1629   LEUKOCYTESUR NEGATIVE 05/04/2021 1629   Sepsis Labs Invalid input(s): PROCALCITONIN,  WBC,  LACTICIDVEN Microbiology Recent Results (from the past 240 hour(s))  Resp Panel by RT-PCR (Flu A&B, Covid) Nasopharyngeal Swab     Status: None   Collection Time: 05/01/21 10:39 AM   Specimen: Nasopharyngeal Swab; Nasopharyngeal(NP) swabs in  vial transport medium  Result Value Ref Range Status   SARS Coronavirus 2 by RT PCR NEGATIVE NEGATIVE Final    Comment: (NOTE) SARS-CoV-2 target nucleic acids are NOT DETECTED.  The SARS-CoV-2 RNA is generally detectable in upper respiratory specimens during the acute phase of infection.  The lowest concentration of SARS-CoV-2 viral copies this assay can detect is 138 copies/mL. A negative result does not preclude SARS-Cov-2 infection and should not be used as the sole basis for treatment or other patient management decisions. A negative result may occur with  improper specimen collection/handling, submission of specimen other than nasopharyngeal swab, presence of viral mutation(s) within the areas targeted by this assay, and inadequate number of viral copies(<138 copies/mL). A negative result must be combined with clinical observations, patient history, and epidemiological information. The expected result is Negative.  Fact Sheet for Patients:  EntrepreneurPulse.com.au  Fact Sheet for Healthcare Providers:  IncredibleEmployment.be  This test is no t yet approved or cleared by the [place] FDA and  has been authorized for detection and/or diagnosis of SARS-CoV-2 by FDA under an Emergency Use Authorization (EUA). This EUA will remain  in effect (meaning this test can be used) for the duration of the COVID-19 declaration under Section 564(b)(1) of the Act, 21 U.S.C.section 360bbb-3(b)(1), unless the authorization is terminated  or revoked sooner.       Influenza A by PCR NEGATIVE NEGATIVE Final   Influenza B by PCR NEGATIVE NEGATIVE Final    Comment: (NOTE) The Xpert Xpress SARS-CoV-2/FLU/RSV plus assay is intended as an aid in the diagnosis of influenza from Nasopharyngeal swab specimens and should not be used as a sole basis for treatment. Nasal washings and aspirates are unacceptable for Xpert Xpress SARS-CoV-2/FLU/RSV testing.  Fact Sheet for Patients: EntrepreneurPulse.com.au  Fact Sheet for Healthcare Providers: IncredibleEmployment.be  This test is not yet approved or cleared by the [place] FDA and has been authorized for detection and/or diagnosis of SARS-CoV-2 by FDA  under an Emergency Use Authorization (EUA). This EUA will remain in effect (meaning this test can be used) for the duration of the COVID-19 declaration under Section 564(b)(1) of the Act, 21 U.S.C. section 360bbb-3(b)(1), unless the authorization is terminated or revoked.  Performed at [facility], [place] [address], [place], [place] [address]   Blood culture (routine x 2)     Status: None   Collection Time: 05/05/21  9:55 AM   Specimen: BLOOD  Result Value Ref Range Status   Specimen Description   Final    BLOOD RIGHT ANTECUBITAL Performed at [facility] [address], [place], [place] [address]    Special Requests   Final    BOTTLES DRAWN AEROBIC AND ANAEROBIC Blood Culture results may not be optimal due to an inadequate volume of blood received in culture bottles Performed at [facility] [address] [place], [place] [address]    Culture   Final    NO GROWTH 5 DAYS Performed at [facility], [place] [address], [place], [place] [address]    Report Status 05/10/2021 FINAL  Final  Blood culture (routine x 2)     Status: None   Collection Time: 05/05/21 10:42 AM   Specimen: BLOOD  Result Value Ref Range Status   Specimen Description   Final    BLOOD LEFT ANTECUBITAL Performed at [facility] [address], [place], [place] [address]    Special Requests   Final  BOTTLES DRAWN AEROBIC AND ANAEROBIC Blood Culture adequate volume Performed at [facility] [address], [place], [place] [address]    Culture   Final    NO GROWTH 5 DAYS Performed at [facility], [place] [address], [place], [place] [address]    Report Status 05/10/2021 FINAL  Final  Resp Panel by RT-PCR (Flu A&B, Covid) Nasopharyngeal Swab     Status: None   Collection Time: 05/05/21  1:46 PM   Specimen: Nasopharyngeal Swab; Nasopharyngeal(NP) swabs in vial transport medium  Result Value Ref Range  Status   SARS Coronavirus 2 by RT PCR NEGATIVE NEGATIVE Final    Comment: (NOTE) SARS-CoV-2 target nucleic acids are NOT DETECTED.  The SARS-CoV-2 RNA is generally detectable in upper respiratory specimens during the acute phase of infection. The lowest concentration of SARS-CoV-2 viral copies this assay can detect is 138 copies/mL. A negative result does not preclude SARS-Cov-2 infection and should not be used as the sole basis for treatment or other patient management decisions. A negative result may occur with  improper specimen collection/handling, submission of specimen other than nasopharyngeal swab, presence of viral mutation(s) within the areas targeted by this assay, and inadequate number of viral copies(<138 copies/mL). A negative result must be combined with clinical observations, patient history, and epidemiological information. The expected result is Negative.  Fact Sheet for Patients:  EntrepreneurPulse.com.au  Fact Sheet for Healthcare Providers:  IncredibleEmployment.be  This test is no t yet approved or cleared by the [place] FDA and  has been authorized for detection and/or diagnosis of SARS-CoV-2 by FDA under an Emergency Use Authorization (EUA). This EUA will remain  in effect (meaning this test can be used) for the duration of the COVID-19 declaration under Section 564(b)(1) of the Act, 21 U.S.C.section 360bbb-3(b)(1), unless the authorization is terminated  or revoked sooner.       Influenza A by PCR NEGATIVE NEGATIVE Final   Influenza B by PCR NEGATIVE NEGATIVE Final    Comment: (NOTE) The Xpert Xpress SARS-CoV-2/FLU/RSV plus assay is intended as an aid in the diagnosis of influenza from Nasopharyngeal swab specimens and should not be used as a sole basis for treatment. Nasal washings and aspirates are unacceptable for Xpert Xpress SARS-CoV-2/FLU/RSV testing.  Fact Sheet for  Patients: EntrepreneurPulse.com.au  Fact Sheet for Healthcare Providers: IncredibleEmployment.be  This test is not yet approved or cleared by the [place] FDA and has been authorized for detection and/or diagnosis of SARS-CoV-2 by FDA under an Emergency Use Authorization (EUA). This EUA will remain in effect (meaning this test can be used) for the duration of the COVID-19 declaration under Section 564(b)(1) of the Act, 21 U.S.C. section 360bbb-3(b)(1), unless the authorization is terminated or revoked.  Performed at [facility], [place] [address] [place], [place] [address]   MRSA Next Gen by PCR, Nasal     Status: None   Collection Time: 05/05/21  4:14 PM   Specimen: Nasal Mucosa; Nasal Swab  Result Value Ref Range Status   MRSA by PCR Next Gen NOT DETECTED NOT DETECTED Final    Comment: (NOTE) The GeneXpert MRSA Assay (FDA approved for NASAL specimens only), is one component of a comprehensive MRSA colonization surveillance program. It is not intended to diagnose MRSA infection nor to guide or monitor treatment for MRSA infections. Test performance is not FDA approved in patients less than 67 years old. Performed at [facility], [place] [address] [place], [place] [address]  C Difficile Quick Screen w PCR reflex     Status: None   Collection Time: 05/06/21 10:10 AM   Specimen: STOOL  Result Value Ref Range Status   C Diff antigen NEGATIVE NEGATIVE Final   C Diff toxin NEGATIVE NEGATIVE Final   C Diff interpretation No C. difficile detected.  Final    Comment: Performed at [facility], [place] [address] [place], [place] [address]     Time coordinating discharge: 45 minutes  SIGNED:   Tawni Millers, MD  [facility] 05/11/2021, 8:30 AM

## 2021-05-11 NOTE — Progress Notes (Signed)
Patient was discharged to home, AVS reviewed and all questions answered. She will set up appointments with onc/psych in [place]. Home medications returned from the pharmacy, she confirmed she had all belongings, and new prescriptions were sent to the correct outpatient pharmacy. Charge RN, Caren Griffins, deaccessed her port. Assisted patient to the main entrance and her parents provided transportation.

## 2021-05-12 ENCOUNTER — Other Ambulatory Visit: Payer: Self-pay | Admitting: *Deleted

## 2021-05-12 MED ORDER — ONDANSETRON 4 MG PO TBDP: 4.0000 mg | ORAL_TABLET | Freq: Three times a day (TID) | ORAL | 1 refills | Status: DC | PRN

## 2021-05-13 MED FILL — Dexamethasone Sodium Phosphate Inj 100 MG/10ML: INTRAMUSCULAR | Qty: 1 | Status: AC

## 2021-05-13 NOTE — Progress Notes (Signed)
After a prolonged admission parental opted for no further life-prolonging treatment, moved to [place] with her parents and is seeing oncology and hospice locally.  All treatment and appointments here accordingly have been canceled.

## 2021-05-14 ENCOUNTER — Other Ambulatory Visit: Payer: Medicaid Other

## 2021-05-14 ENCOUNTER — Ambulatory Visit (HOSPITAL_BASED_OUTPATIENT_CLINIC_OR_DEPARTMENT_OTHER): Payer: Medicaid Other | Admitting: Oncology

## 2021-05-14 ENCOUNTER — Ambulatory Visit: Payer: Medicaid Other

## 2021-05-14 DIAGNOSIS — C799 Secondary malignant neoplasm of unspecified site: Secondary | ICD-10-CM

## 2021-05-21 ENCOUNTER — Encounter: Payer: Self-pay | Admitting: Oncology

## 2021-05-22 ENCOUNTER — Other Ambulatory Visit: Payer: Self-pay | Admitting: Oncology

## 2021-05-22 NOTE — Progress Notes (Signed)
This note is to document that [name] has widely metastatic recurrent breast cancer and is now under hospice care  The patient's participation in the upbeat study is irrelevant to her disease progression.  There is no evidence that participating in the study contributed to the patient has disease progression.  None of the patient's current problems are associated with her participation in the upbeat study.

## 2021-05-27 ENCOUNTER — Ambulatory Visit: Payer: 59

## 2021-05-27 ENCOUNTER — Other Ambulatory Visit: Payer: 59

## 2021-06-04 ENCOUNTER — Other Ambulatory Visit: Payer: Medicaid Other

## 2021-06-04 ENCOUNTER — Ambulatory Visit: Payer: Medicaid Other

## 2021-06-12 NOTE — Progress Notes (Signed)
   06/12/21 1853  Presentation  General Appearance Disheveled  Eye Contact Fleeting  Speech Clear and Coherent  Speech Volume Decreased  Mood and Affect  Mood Depressed  Affect Depressed  Thought Processes  Thought Process Goal Directed  Descriptions of Associations Intact  Orientation Full (Time, Place and Person)  Thought Content Logical  Hallucinations None  Ideas of Reference None  Suicidal Thoughts Yes, Passive  SI Active Intent and/or Plan Without Intent  SI Passive Intent and/or Plan Without Intent  Homicidal Thoughts No  Sensorium  Memory Immediate Good;Remote Fair  Judgment Poor  Insight Poor  Executive Functions  Concentration Fair  Attention Span [place] of Knowledge Fair  Language Fair  Psychomotor Activity  Psychomotor Activity Decreased  Assets  Assets Communication Skills  Sleep  Sleep Poor

## 2021-06-12 NOTE — H&P (Signed)
Psychiatric Admission Assessment Adult  Patient Identification: Monica [name] MRN:  [id number] Date of Evaluation:  06/12/2021 Chief Complaint:  Major depressive disorder [F32.9] Principal Diagnosis: Major depressive disorder Diagnosis:  Principal Problem:   Major depressive disorder  History of Present Illness: Patient was admitted to the psychiatric unit on 05/02/21. She was on the unit for one day. She was started on medications for mood stabilization. It was noted that the patient had significant neutropenia of 1.3 with ANC of 900, and was sent to [facility] for workup on recommendation of the Oncologist. She was sent back from ED after being given a dose of Granix.  This is the H&P for the Re-admit. There has been limited mood improvement, but the medication changes have been tolerated.   Per previous H&P on 05/02/21:  Monica [name] is a 50 year old female with no prior psychiatric history and a medical history of Stage IV, metastatic triple positive breast cancer admitted voluntarily for worsening depression and SI.    On assessment today, Monica [name] endorses that over the past couple of months, since the recurrence and metastatic nature of her breast cancer, she has felt down, depressed, hopeless, lonely and without reason to live.She endorses being in pain 2/2 mets, s/p R hip replacement which has made ambulating difficult, and increased difficulty in completing ADLs. She reports that she no longer wants interventions, as she feels as though she no longer has control of her life nor her care; she had the plan to end her life with pills. She is, however, able to contract for safety on the unit.   She endorses SI, but denies HI/AVH, paranoia and other first-rank sx, and does not voice delusions nor appears to be internally preoccupied. Of her depression, along with the aforementioned sx, Monica [name] also endorses poor sleep (nighttime insomnia and daytime hypersomnolence), anhedonia, difficulty concentrating, and  poor appetite. She denies manic/hypomanic sx, sx of PTSD, and anxiety. Additional somatic sx include painful mouth sores likely 2/2 steroid use, xerostomia, and ongoing GI discomfort. Monica [name] insists that she does not want to increase the Cymbalta dose, and although initially hesitant, she is open to medication changes.  Associated Signs/Symptoms: Depression Symptoms:  depressed mood, anhedonia, psychomotor retardation, fatigue, feelings of worthlessness/guilt, difficulty concentrating, hopelessness, recurrent thoughts of death, panic attacks, loss of energy/fatigue, disturbed sleep, weight loss, decreased appetite, Duration of Depression Symptoms: No data recorded (Hypo) Manic Symptoms:   na Anxiety Symptoms:  Excessive Worry, Psychotic Symptoms:   na PTSD Symptoms: NA Total Time spent with patient: 20 minutes  Past Psychiatric History:  Previous Psych Diagnoses: denies  Prior inpatient treatment:denies  Current/prior outpatient treatment:denies  Prior rehab hx: denies  Psychotherapy hx: Saw a therapist for 2-3 sessions, but did not mesh well with their personality History of suicide: denies  History of homicide: denies  Psychiatric medication history: Cymbalta for nerve pain, otherwise denies  Psychiatric medication compliance history: denies  Current Psychiatrist: denies  Current therapist: denies    Is the patient at risk to self? Yes.    Has the patient been a risk to self in the past 6 months? Yes.    Has the patient been a risk to self within the distant past? No.  Is the patient a risk to others? No.  Has the patient been a risk to others in the past 6 months? No.  Has the patient been a risk to others within the distant past? No.   Prior Inpatient Therapy:   Prior Outpatient Therapy:  Alcohol Screening: 1. How often do you have a drink containing alcohol?: Never 2. How many drinks containing alcohol do you have on a typical day when you are drinking?: 1 or  2 3. How often do you have six or more drinks on one occasion?: Never AUDIT-C Score: 0 4. How often during the last year have you found that you were not able to stop drinking once you had started?: Never 5. How often during the last year have you failed to do what was normally expected from you because of drinking?: Never 6. How often during the last year have you needed a first drink in the morning to get yourself going after a heavy drinking session?: Never 7. How often during the last year have you had a feeling of guilt of remorse after drinking?: Never 8. How often during the last year have you been unable to remember what happened the night before because you had been drinking?: Never 9. Have you or someone else been injured as a result of your drinking?: No 10. Has a relative or friend or a doctor or another health worker been concerned about your drinking or suggested you cut down?: No Alcohol Use Disorder Identification Test Final Score (AUDIT): 0 Substance Abuse History in the last 12 months:  No. Consequences of Substance Abuse: Negative Previous Psychotropic Medications: No  Psychological Evaluations: Yes  Past Medical History:  Past Medical History:  Diagnosis Date   Arthritis    lower back   Cancer ([place])    right breast cancer   Family history of breast cancer    Personal history of chemotherapy    Personal history of radiation therapy     Past Surgical History:  Procedure Laterality Date   ABDOMINAL HYSTERECTOMY     APPLICATION OF CRANIAL NAVIGATION N/A 12/06/2020   Procedure: APPLICATION OF CRANIAL NAVIGATION;  Surgeon: Judith Part, MD;  Location: [facility];  Service: Neurosurgery;  Laterality: N/A;  RM 20   BREAST LUMPECTOMY Right    BREAST LUMPECTOMY WITH RADIOACTIVE SEED AND SENTINEL LYMPH NODE BIOPSY Right 02/16/2018   Procedure: RIGHT BREAST LUMPECTOMY WITH RADIOACTIVE SEED AND SENTINEL LYMPH NODE BIOPSY;  Surgeon: [name], MD;  Location: [facility];  Service: General;  Laterality: Right;   CRANIOTOMY Right 12/06/2020   Procedure: Right sided awake craniotomy for tumor resection;  Surgeon: Judith Part, MD;  Location: [facility];  Service: Neurosurgery;  Laterality: Right;   HYMENECTOMY     IR IMAGING GUIDED PORT INSERTION  03/12/2021   IR US GUIDE BX ASP/DRAIN  12/02/2020   KYPHOPLASTY N/A 03/07/2021   Procedure: Thoracic nine, Thoracic ten KYPHOPLASTY;  Surgeon: Judith Part, MD;  Location: [facility];  Service: Neurosurgery;  Laterality: N/A;   PORTACATH PLACEMENT Right 02/16/2018   Procedure: INSERTION PORT-A-CATH;  Surgeon: [name], MD;  Location: [facility];  Service: General;  Laterality: Right;   RE-EXCISION OF BREAST LUMPECTOMY Right 02/22/2018   Procedure: RE-EXCISION OF RIGHT  BREAST LUMPECTOMY;  Surgeon: [name], MD;  Location: [facility];  Service: General;  Laterality: Right;   REPAIR VAGINAL CUFF N/A 02/07/2017   Procedure: REPAIR VAGINAL CUFF;  Surgeon: Ena Dawley, MD;  Location: [facility];  Service: Gynecology;  Laterality: N/A;   ROBOTIC ASSISTED TOTAL HYSTERECTOMY WITH SALPINGECTOMY Left 01/20/2017   Procedure: ROBOTIC ASSISTED TOTAL HYSTERECTOMY WITH SALPINGECTOMY;  Surgeon: [name], MD;  Location: [facility];  Service: Gynecology;  Laterality: Left;   TOTAL  HIP ARTHROPLASTY Right 2022   total hip and partial femur, per pt   Family History:  Family History  Problem Relation Age of Onset   Lung cancer Maternal Grandfather    Esophageal cancer Paternal Grandfather 3   Breast cancer Cousin 22   Family Psychiatric  History: Sister- MDD, OCD Tobacco Screening:   Social History:  Social History   Substance and Sexual Activity  Alcohol Use Not Currently     Social History   Substance and Sexual Activity  Drug Use No    Additional Social History: 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 The patient is  separated from her husband, [name]. Current partner lives in [place]. The patient's son [name] is in the [employer] and is stationed in [place] (for 3 years beginning January 2020). He plans on working in the railroad industry after he returns. The patient's daughter [name], age 43, works at Engineer, maintenance.  Allergies:  No Known Allergies Lab Results: No results found for this or any previous visit (from the past 48 hour(s)).  Blood Alcohol level:  Lab Results  Component Value Date   ETH <10 62/13/0865    Metabolic Disorder Labs:  Lab Results  Component Value Date   HGBA1C 6.1 (H) 05/02/2021   MPG 128.37 05/02/2021   No results found for: PROLACTIN Lab Results  Component Value Date   CHOL 177 05/02/2021   TRIG 193 (H) 05/02/2021   HDL 65 05/02/2021   CHOLHDL 2.7 05/02/2021   VLDL 39 05/02/2021   LDLCALC 73 05/02/2021    Current Medications: No current facility-administered medications for this encounter.   Current Outpatient Medications  Medication Sig Dispense Refill   acetaminophen (TYLENOL) 500 MG tablet Take 1 tablet (500 mg total) by mouth 3 (three) times daily as needed for mild pain (or Fever >/= 101). Take with ibuprofen 400 mg     b complex vitamins capsule Take 1 capsule by mouth daily. 900 mg     capecitabine (XELODA) 500 MG tablet Take 1,500 mg by mouth 2 (two) times daily. Takes daily for 21 days, then 7 days off, then repeat cycle     Cholecalciferol (VITAMIN D) 125 MCG (5000 UT) CAPS Take 5,000 Units by mouth daily.     dexamethasone (DECADRON) 4 MG tablet Take 2 tablets (8 mg total) by mouth daily. Start the day after chemotherapy for 2 days. 30 tablet 1   ferrous sulfate 325 (65 FE) MG tablet Take 325 mg by mouth daily with breakfast.     gabapentin (NEURONTIN) 300 MG capsule Take 1 capsule (300 mg total) by mouth 3 (three) times daily. 90 capsule 6   ibuprofen (ADVIL) 200 MG tablet Take 400 mg by mouth 3 (three) times daily as needed for mild pain (take with  tylenol).     mirtazapine (REMERON) 30 MG tablet Take 1 tablet (30 mg total) by mouth at bedtime. 30 tablet 1   Multiple Vitamin (MULTIVITAMIN WITH MINERALS) TABS tablet Take 1 tablet by mouth daily.     omeprazole (PRILOSEC) 40 MG capsule Take 40 mg by mouth daily.     ondansetron (ZOFRAN ODT) 4 MG disintegrating tablet Take 1 tablet (4 mg total) by mouth every 8 (eight) hours as needed for nausea. 20 tablet 1   pyridOXINE (VITAMIN B-6) 100 MG tablet Take 100 mg by mouth daily.     sertraline (ZOLOFT) 50 MG tablet Take 1 tablet (50 mg total) by mouth daily. 30 tablet  1   TUKYSA 150 MG tablet Take 150 mg by mouth 2 (two) times daily.     valACYclovir (VALTREX) 1000 MG tablet Take 1 tablet (1,000 mg total) by mouth 2 (two) times daily. Take one tablet twice a day for one week, then daily (Patient taking differently: Take 1,000 mg by mouth daily.) 60 tablet 6   Facility-Administered Medications Ordered in Other Encounters  Medication Dose Route Frequency Provider Last Rate Last Admin   sodium chloride flush (NS) 0.9 % injection 10 mL  10 mL Intracatheter Once Magrinat, [name], MD       PTA Medications: (Not in a hospital admission)   Musculoskeletal: Strength & Muscle Tone: decreased Gait & Station: unsteady Patient leans: Front            Psychiatric Specialty Exam:  Presentation  General Appearance: Disheveled  Eye Contact:Fleeting  Speech:Clear and Coherent  Speech Volume:Decreased  Handedness:Right   Mood and Affect  Mood:Depressed  Affect:Depressed   Thought Process  Thought Processes:Goal Directed  Duration of Psychotic Symptoms: No data recorded Past Diagnosis of Schizophrenia or Psychoactive disorder: No data recorded Descriptions of Associations:Intact  Orientation:Full (Time, Place and Person)  Thought Content:Logical  Hallucinations:Hallucinations: None  Ideas of Reference:None  Suicidal Thoughts:Suicidal Thoughts: Yes, Passive SI Active  Intent and/or Plan: Without Intent SI Passive Intent and/or Plan: Without Intent  Homicidal Thoughts:Homicidal Thoughts: No   Sensorium  Memory:Immediate Good; Remote Fair  Judgment:Poor  Insight:Poor   Executive Functions  Concentration:Fair  Attention Span:Fair  [place]   Psychomotor Activity  Psychomotor Activity:Psychomotor Activity: Decreased   Assets  Assets:Communication Skills   Sleep  Sleep:Sleep: Poor    Physical Exam: Physical Exam ROS Blood pressure 121/87, pulse (!) 117, temperature (!) 101.2 F (38.4 C), temperature source Oral, resp. rate 18, height 5\' 8"  (1.727 m), weight 66.7 kg, last menstrual period 12/21/2016, SpO2 99 %. Body mass index is 22.35 kg/m.  Treatment Plan Summary: Daily contact with patient to assess and evaluate symptoms and progress in treatment and Medication management  Observation Level/Precautions:  15 minute checks  Laboratory:   na  Psychotherapy:    Medications:    Consultations:    Discharge Concerns:    Estimated LOS:  Other:     Physician Treatment Plan for Primary Diagnosis: Major depressive disorder Long Term Goal(s): Improvement in symptoms so as ready for discharge  Short Term Goals: Ability to identify changes in lifestyle to reduce recurrence of condition will improve, Ability to verbalize feelings will improve, Ability to disclose and discuss suicidal ideas, Ability to demonstrate self-control will improve, Ability to identify and develop effective coping behaviors will improve, Ability to maintain clinical measurements within normal limits will improve, and Compliance with prescribed medications will improve  Physician Treatment Plan for Secondary Diagnosis: Principal Problem:   Major depressive disorder  Long Term Goal(s): Improvement in symptoms so as ready for discharge  Short Term Goals: Ability to identify changes in lifestyle to reduce recurrence of  condition will improve, Ability to verbalize feelings will improve, Ability to disclose and discuss suicidal ideas, Ability to demonstrate self-control will improve, Ability to identify and develop effective coping behaviors will improve, Ability to maintain clinical measurements within normal limits will improve, and Compliance with prescribed medications will improve  I certify that inpatient services furnished can reasonably be expected to improve the patient's condition.    Maida Sale, MD 12/1/20226:56 PM

## 2021-06-12 NOTE — [facility] Suicide Risk Assessment (Signed)
[facility] Admission Suicide Risk Assessment   Nursing information obtained from:  Patient Demographic factors:  Caucasian, Living alone, Low socioeconomic status Current Mental Status:  Suicidal ideation indicated by patient Loss Factors:  Decline in physical health, Financial problems / change in socioeconomic status Historical Factors:  NA Risk Reduction Factors:  Sense of responsibility to family  Total Time spent with patient:  0 Principal Problem: <principal problem not specified> Diagnosis:  Active Problems:   Major depressive disorder  Subjective Data: Margery is a 50 YO CF with a history of depression who presents with SI initially, was sent to the ER for medical instability and then sent back to the psychiatric facility and is still passively suicidal. She still has a very poor health prognosis and is still depressed. Per previous SRA prior to this one:   Yehudis is a 50 year old female with no prior psychiatric history and a medical history of Stage IV, metastatic triple positive breast cancer admitted voluntarily for worsening depression and SI.  During hospitalization, Zoloft and Remeron were started for depression, poor appetite, and insomnia. During hospitalization, patient was transferred to the less emergency department for work-up of neutropenia, and was sent back to [facility]. On the day of discharge, the patient had abnormal labs that were significant for low WBC and low ANC, and the patient was febrile, and it was decided to again transfer the patient to the emergency department for further work-up.   This Probation officer did not be obtained to evaluate the patient to assess mental status prior to transfer to the emergency department.  Continued Clinical Symptoms:  Alcohol Use Disorder Identification Test Final Score (AUDIT): 0 The "Alcohol Use Disorders Identification Test", Guidelines for Use in Primary Care, Second Edition.  World Pharmacologist [facility]). Score between 0-7:  no or low  risk or alcohol related problems. Score between 8-15:  moderate risk of alcohol related problems. Score between 16-19:  high risk of alcohol related problems. Score 20 or above:  warrants further diagnostic evaluation for alcohol dependence and treatment.   CLINICAL FACTORS:   Depression:   Hopelessness Severe Chronic Pain Medical Diagnoses and Treatments/Surgeries   Musculoskeletal: Strength & Muscle Tone: decreased Gait & Station: unsteady Patient leans: Front  Psychiatric Specialty Exam:  Presentation  General Appearance: Disheveled  Eye Contact:Fleeting  Speech:Slow  Speech Volume:Decreased  Handedness:Right   Mood and Affect  Mood:Depressed  Affect:Blunt   Thought Process  Thought Processes:Goal Directed  Descriptions of Associations:Intact  Orientation:Full (Time, Place and Person)  Thought Content:Logical  History of Schizophrenia/Schizoaffective disorder:No data recorded Duration of Psychotic Symptoms:No data recorded Hallucinations:Hallucinations: None  Ideas of Reference:None  Suicidal Thoughts:Suicidal Thoughts: Yes, Passive SI Active Intent and/or Plan: Without Means to Carry Out; Without Plan; Without Intent SI Passive Intent and/or Plan: Without Intent  Homicidal Thoughts:Homicidal Thoughts: No   Sensorium  Memory:Immediate Good; Remote Fair  Judgment:Poor  Insight:Poor   Executive Functions  Concentration:Fair  Attention Span:Fair  [place]  Language:Good   Psychomotor Activity  Psychomotor Activity:Psychomotor Activity: Decreased   Assets  Assets:Communication Skills   Sleep  Sleep:Sleep: Poor    Physical Exam: Physical Exam Vitals and nursing note reviewed.  HENT:     Head: Normocephalic.  Eyes:     Extraocular Movements: Extraocular movements intact.  Pulmonary:     Effort: Pulmonary effort is normal.  Neurological:     Mental Status: She is alert.     Motor: Weakness  present.     Gait:  Gait abnormal.  Psychiatric:        Mood and Affect: Mood is depressed.        Behavior: Behavior is slowed.        Thought Content: Thought content includes suicidal ideation.   ROS Blood pressure 121/87, pulse (!) 117, temperature (!) 101.2 F (38.4 C), temperature source Oral, resp. rate 18, height 5\' 8"  (1.727 m), weight 66.7 kg, last menstrual period 12/21/2016, SpO2 99 %. Body mass index is 22.35 kg/m.   COGNITIVE FEATURES THAT CONTRIBUTE TO RISK:  Thought constriction (tunnel vision)    SUICIDE RISK:   Moderate:  Frequent suicidal ideation with limited intensity, and duration, some specificity in terms of plans, no associated intent, good self-control, limited dysphoria/symptomatology, some risk factors present, and identifiable protective factors, including available and accessible social support.  PLAN OF CARE:  Safety and Monitoring:             -- Voluntary admission to inpatient psychiatric unit for safety, stabilization and treatment             -- Daily contact with patient to assess and evaluate symptoms and progress in treatment             -- Patient's case to be discussed in multi-disciplinary team meeting             -- Observation Level : q15 minute checks             -- Vital signs:  q12 hours             -- Precautions: suicide, elopement, and assault   2. Psychiatric Diagnoses and Treatment:               -Start Zoloft 25 mg once daily today.  Titrate during hospitalization.  For mood and anxiety.  May consider addition of Remeron or stimulant in the future. -Continue trazodone as needed for mood and insomnia -Consider hydroxyzine as needed for anxiety -Continue gabapentin 300 mg 3 times daily -PT evaluation for unsteady gait -We will request walking device for assistance until PT evaluation.     --  The risks/benefits/side-effects/alternatives to this medication were discussed in detail with the patient and time was given for questions.  The patient consents to medication trial.                -- Metabolic profile and EKG monitoring obtained while on an atypical antipsychotic (BMI: Lipid Panel: HbgA1c: QTc:)              -- Encouraged patient to participate in unit milieu and in scheduled group therapies              -- Short Term Goals: Ability to identify changes in lifestyle to reduce recurrence of condition will improve, Ability to verbalize feelings will improve, Ability to disclose and discuss suicidal ideas, Ability to demonstrate self-control will improve, Ability to identify and develop effective coping behaviors will improve, Ability to maintain clinical measurements within normal limits will improve, Compliance with prescribed medications will improve, and Ability to identify triggers associated with substance abuse/mental health issues will improve             -- Long Term Goals: Improvement in symptoms so as ready for discharge                            3. Medical Issues Being Addressed:  Breast cancer   4. Discharge Planning:              -- Social work and case management to assist with discharge planning and identification of hospital follow-up needs prior to discharge             -- Estimated LOS: 5-7 days             -- Discharge Concerns: Need to establish a safety plan; Medication compliance and effectiveness             -- Discharge Goals: Return home with outpatient referrals for mental health follow-up including medication management/psychotherapy  I certify that inpatient services furnished can reasonably be expected to improve the patient's condition.   Maida Sale, MD 06/12/2021, 6:47 PM

## 2021-06-17 ENCOUNTER — Other Ambulatory Visit: Payer: 59

## 2021-06-17 ENCOUNTER — Ambulatory Visit: Payer: 59

## 2021-06-17 ENCOUNTER — Ambulatory Visit: Payer: Medicaid Other | Admitting: Internal Medicine

## 2021-06-25 ENCOUNTER — Other Ambulatory Visit: Payer: Medicaid Other

## 2021-06-25 ENCOUNTER — Ambulatory Visit: Payer: Medicaid Other

## 2021-07-08 ENCOUNTER — Ambulatory Visit: Payer: 59

## 2021-07-08 ENCOUNTER — Other Ambulatory Visit: Payer: 59

## 2021-07-16 ENCOUNTER — Ambulatory Visit: Payer: Medicaid Other

## 2021-07-16 ENCOUNTER — Other Ambulatory Visit: Payer: Medicaid Other

## 2021-08-20 ENCOUNTER — Telehealth: Payer: Self-pay | Admitting: *Deleted

## 2021-08-20 NOTE — Telephone Encounter (Signed)
CT could not push disk over to [facility] for CT and MRI. Pt is aware and Tedra Coupe in [place] was given information for to [facility] facility in [place] to send disk. Pt made aware and verbalized understanding

## 2021-10-12 ENCOUNTER — Encounter: Payer: Self-pay | Admitting: Oncology

## 2023-01-11 DEATH — deceased
